# Patient Record
Sex: Female | Born: 1945 | Race: White | Hispanic: No | Marital: Single | Smoking: Never smoker
Health system: Southern US, Community
[De-identification: ages and names within clinical notes are randomized; demographics above are authoritative.]

## PROBLEM LIST (undated history)

## (undated) DIAGNOSIS — R0600 Dyspnea, unspecified: Secondary | ICD-10-CM

## (undated) DIAGNOSIS — R300 Dysuria: Secondary | ICD-10-CM

## (undated) DIAGNOSIS — I25119 Atherosclerotic heart disease of native coronary artery with unspecified angina pectoris: Secondary | ICD-10-CM

## (undated) DIAGNOSIS — Z5189 Encounter for other specified aftercare: Secondary | ICD-10-CM

## (undated) DIAGNOSIS — I2510 Atherosclerotic heart disease of native coronary artery without angina pectoris: Secondary | ICD-10-CM

## (undated) DIAGNOSIS — I350 Nonrheumatic aortic (valve) stenosis: Secondary | ICD-10-CM

## (undated) DIAGNOSIS — E11621 Type 2 diabetes mellitus with foot ulcer: Secondary | ICD-10-CM

## (undated) DIAGNOSIS — R6251 Failure to thrive (child): Secondary | ICD-10-CM

## (undated) DIAGNOSIS — S46819A Strain of other muscles, fascia and tendons at shoulder and upper arm level, unspecified arm, initial encounter: Secondary | ICD-10-CM

## (undated) DIAGNOSIS — K5289 Other specified noninfective gastroenteritis and colitis: Secondary | ICD-10-CM

## (undated) DIAGNOSIS — R5383 Other fatigue: Secondary | ICD-10-CM

## (undated) DIAGNOSIS — N390 Urinary tract infection, site not specified: Secondary | ICD-10-CM

## (undated) DIAGNOSIS — J309 Allergic rhinitis, unspecified: Principal | ICD-10-CM

## (undated) DIAGNOSIS — IMO0001 Reserved for inherently not codable concepts without codable children: Secondary | ICD-10-CM

## (undated) DIAGNOSIS — R011 Cardiac murmur, unspecified: Secondary | ICD-10-CM

## (undated) DIAGNOSIS — K5730 Diverticulosis of large intestine without perforation or abscess without bleeding: Secondary | ICD-10-CM

## (undated) DIAGNOSIS — F329 Major depressive disorder, single episode, unspecified: Secondary | ICD-10-CM

## (undated) DIAGNOSIS — S329XXA Fracture of unspecified parts of lumbosacral spine and pelvis, initial encounter for closed fracture: Secondary | ICD-10-CM

## (undated) DIAGNOSIS — I4891 Unspecified atrial fibrillation: Secondary | ICD-10-CM

## (undated) DIAGNOSIS — E785 Hyperlipidemia, unspecified: Secondary | ICD-10-CM

## (undated) DIAGNOSIS — I499 Cardiac arrhythmia, unspecified: Secondary | ICD-10-CM

## (undated) DIAGNOSIS — E119 Type 2 diabetes mellitus without complications: Secondary | ICD-10-CM

## (undated) DIAGNOSIS — R946 Abnormal results of thyroid function studies: Secondary | ICD-10-CM

## (undated) DIAGNOSIS — K219 Gastro-esophageal reflux disease without esophagitis: Secondary | ICD-10-CM

## (undated) DIAGNOSIS — R5381 Other malaise: Secondary | ICD-10-CM

## (undated) DIAGNOSIS — R109 Unspecified abdominal pain: Secondary | ICD-10-CM

## (undated) DIAGNOSIS — L989 Disorder of the skin and subcutaneous tissue, unspecified: Secondary | ICD-10-CM

## (undated) DIAGNOSIS — M25569 Pain in unspecified knee: Secondary | ICD-10-CM

## (undated) DIAGNOSIS — M1990 Unspecified osteoarthritis, unspecified site: Secondary | ICD-10-CM

## (undated) DIAGNOSIS — J019 Acute sinusitis, unspecified: Secondary | ICD-10-CM

## (undated) DIAGNOSIS — A4902 Methicillin resistant Staphylococcus aureus infection, unspecified site: Secondary | ICD-10-CM

## (undated) DIAGNOSIS — M810 Age-related osteoporosis without current pathological fracture: Secondary | ICD-10-CM

## (undated) DIAGNOSIS — J069 Acute upper respiratory infection, unspecified: Secondary | ICD-10-CM

## (undated) DIAGNOSIS — I10 Essential (primary) hypertension: Secondary | ICD-10-CM

## (undated) DIAGNOSIS — L97509 Non-pressure chronic ulcer of other part of unspecified foot with unspecified severity: Secondary | ICD-10-CM

## (undated) DIAGNOSIS — F411 Generalized anxiety disorder: Secondary | ICD-10-CM

## (undated) DIAGNOSIS — S43499A Other sprain of unspecified shoulder joint, initial encounter: Secondary | ICD-10-CM

## (undated) DIAGNOSIS — H9190 Unspecified hearing loss, unspecified ear: Secondary | ICD-10-CM

## (undated) DIAGNOSIS — H918X9 Other specified hearing loss, unspecified ear: Secondary | ICD-10-CM

## (undated) HISTORY — DX: Other sprain of unspecified shoulder joint, initial encounter: S43.499A

## (undated) HISTORY — DX: Other fatigue: R53.83

## (undated) HISTORY — DX: Non-pressure chronic ulcer of other part of unspecified foot with unspecified severity: L97.509

## (undated) HISTORY — DX: Fracture of unspecified parts of lumbosacral spine and pelvis, initial encounter for closed fracture: S32.9XXA

## (undated) HISTORY — DX: Failure to thrive (child): R62.51

## (undated) HISTORY — DX: Acute sinusitis, unspecified: J01.90

## (undated) HISTORY — DX: Reserved for inherently not codable concepts without codable children: IMO0001

## (undated) HISTORY — DX: Major depressive disorder, single episode, unspecified: F32.9

## (undated) HISTORY — DX: Other malaise: R53.81

## (undated) HISTORY — DX: Hyperlipidemia, unspecified: E78.5

## (undated) HISTORY — DX: Essential (primary) hypertension: I10

## (undated) HISTORY — DX: Abnormal results of thyroid function studies: R94.6

## (undated) HISTORY — DX: Other specified hearing loss, unspecified ear: H91.8X9

## (undated) HISTORY — DX: Unspecified osteoarthritis, unspecified site: M19.90

## (undated) HISTORY — DX: Methicillin resistant Staphylococcus aureus infection, unspecified site: A49.02

## (undated) HISTORY — DX: Generalized anxiety disorder: F41.1

## (undated) HISTORY — DX: Cardiac murmur, unspecified: R01.1

## (undated) HISTORY — PX: BREAST BIOPSY: SHX20

## (undated) HISTORY — DX: Type 2 diabetes mellitus without complications: E11.9

## (undated) HISTORY — PX: OTHER SURGICAL HISTORY: SHX169

## (undated) HISTORY — PX: CARDIAC CATHETERIZATION: SHX172

## (undated) HISTORY — DX: Unspecified abdominal pain: R10.9

## (undated) HISTORY — DX: Urinary tract infection, site not specified: N39.0

## (undated) HISTORY — DX: Gastro-esophageal reflux disease without esophagitis: K21.9

## (undated) HISTORY — DX: Unspecified atrial fibrillation: I48.91

## (undated) HISTORY — DX: Diverticulosis of large intestine without perforation or abscess without bleeding: K57.30

## (undated) HISTORY — DX: Age-related osteoporosis without current pathological fracture: M81.0

## (undated) HISTORY — DX: Acute upper respiratory infection, unspecified: J06.9

## (undated) HISTORY — DX: Atherosclerotic heart disease of native coronary artery without angina pectoris: I25.10

## (undated) HISTORY — DX: Disorder of the skin and subcutaneous tissue, unspecified: L98.9

## (undated) HISTORY — DX: Allergic rhinitis, unspecified: J30.9

## (undated) HISTORY — DX: Unspecified hearing loss, unspecified ear: H91.90

## (undated) HISTORY — DX: Other specified noninfective gastroenteritis and colitis: K52.89

## (undated) HISTORY — DX: Dysuria: R30.0

## (undated) HISTORY — DX: Encounter for other specified aftercare: Z51.89

## (undated) HISTORY — DX: Pain in unspecified knee: M25.569

## (undated) HISTORY — DX: Nonrheumatic aortic (valve) stenosis: I35.0

## (undated) HISTORY — PX: COLONOSCOPY: SHX5424

## (undated) HISTORY — PX: TONSILLECTOMY: SUR1361

## (undated) HISTORY — DX: Strain of other muscles, fascia and tendons at shoulder and upper arm level, unspecified arm, initial encounter: S46.819A

## (undated) HISTORY — DX: Type 2 diabetes mellitus with foot ulcer: E11.621

---
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
## 1997-07-09 ENCOUNTER — Other Ambulatory Visit: Admission: RE | Admit: 1997-07-09 | Discharge: 1997-07-09 | Payer: Self-pay | Admitting: Family Medicine

## 1998-06-15 ENCOUNTER — Ambulatory Visit (HOSPITAL_COMMUNITY): Admission: RE | Admit: 1998-06-15 | Discharge: 1998-06-15 | Payer: Self-pay | Admitting: Family Medicine

## 1998-08-19 ENCOUNTER — Other Ambulatory Visit: Admission: RE | Admit: 1998-08-19 | Discharge: 1998-08-19 | Payer: Self-pay | Admitting: Family Medicine

## 1999-03-18 ENCOUNTER — Ambulatory Visit (HOSPITAL_COMMUNITY): Admission: RE | Admit: 1999-03-18 | Discharge: 1999-03-18 | Payer: Self-pay | Admitting: Family Medicine

## 1999-03-18 ENCOUNTER — Encounter: Payer: Self-pay | Admitting: Family Medicine

## 1999-08-08 ENCOUNTER — Encounter (INDEPENDENT_AMBULATORY_CARE_PROVIDER_SITE_OTHER): Payer: Self-pay

## 1999-08-08 ENCOUNTER — Other Ambulatory Visit: Admission: RE | Admit: 1999-08-08 | Discharge: 1999-08-08 | Payer: Self-pay | Admitting: Obstetrics and Gynecology

## 2000-08-14 ENCOUNTER — Other Ambulatory Visit: Admission: RE | Admit: 2000-08-14 | Discharge: 2000-08-14 | Payer: Self-pay | Admitting: Obstetrics and Gynecology

## 2000-08-14 ENCOUNTER — Encounter: Admission: RE | Admit: 2000-08-14 | Discharge: 2000-08-14 | Payer: Self-pay | Admitting: Obstetrics and Gynecology

## 2000-08-14 ENCOUNTER — Encounter: Payer: Self-pay | Admitting: Obstetrics and Gynecology

## 2001-08-15 ENCOUNTER — Ambulatory Visit (HOSPITAL_COMMUNITY): Admission: RE | Admit: 2001-08-15 | Discharge: 2001-08-15 | Payer: Self-pay | Admitting: Family Medicine

## 2002-01-30 ENCOUNTER — Emergency Department (HOSPITAL_COMMUNITY): Admission: EM | Admit: 2002-01-30 | Discharge: 2002-01-30 | Payer: Self-pay | Admitting: Emergency Medicine

## 2002-01-30 ENCOUNTER — Encounter: Payer: Self-pay | Admitting: Emergency Medicine

## 2002-07-23 ENCOUNTER — Other Ambulatory Visit: Admission: RE | Admit: 2002-07-23 | Discharge: 2002-07-23 | Payer: Self-pay | Admitting: Family Medicine

## 2002-08-19 ENCOUNTER — Encounter: Payer: Self-pay | Admitting: Obstetrics and Gynecology

## 2002-08-19 ENCOUNTER — Ambulatory Visit (HOSPITAL_COMMUNITY): Admission: RE | Admit: 2002-08-19 | Discharge: 2002-08-19 | Payer: Self-pay | Admitting: Obstetrics and Gynecology

## 2003-09-11 ENCOUNTER — Encounter: Admission: RE | Admit: 2003-09-11 | Discharge: 2003-09-11 | Payer: Self-pay | Admitting: Internal Medicine

## 2004-02-22 ENCOUNTER — Other Ambulatory Visit: Admission: RE | Admit: 2004-02-22 | Discharge: 2004-02-22 | Payer: Self-pay | Admitting: Family Medicine

## 2004-05-09 ENCOUNTER — Ambulatory Visit (HOSPITAL_COMMUNITY): Admission: RE | Admit: 2004-05-09 | Discharge: 2004-05-09 | Payer: Self-pay | Admitting: Gastroenterology

## 2004-06-02 ENCOUNTER — Encounter: Admission: RE | Admit: 2004-06-02 | Discharge: 2004-06-02 | Payer: Self-pay | Admitting: Family Medicine

## 2005-01-27 ENCOUNTER — Encounter: Payer: Self-pay | Admitting: Internal Medicine

## 2005-01-27 LAB — CONVERTED CEMR LAB

## 2005-05-30 ENCOUNTER — Ambulatory Visit: Payer: Self-pay | Admitting: Internal Medicine

## 2005-06-08 ENCOUNTER — Ambulatory Visit: Payer: Self-pay | Admitting: Internal Medicine

## 2005-07-06 ENCOUNTER — Ambulatory Visit: Payer: Self-pay | Admitting: Internal Medicine

## 2005-07-20 ENCOUNTER — Encounter: Admission: RE | Admit: 2005-07-20 | Discharge: 2005-07-20 | Payer: Self-pay | Admitting: Internal Medicine

## 2005-08-18 ENCOUNTER — Ambulatory Visit: Payer: Self-pay | Admitting: Internal Medicine

## 2005-08-18 ENCOUNTER — Ambulatory Visit (HOSPITAL_COMMUNITY): Admission: RE | Admit: 2005-08-18 | Discharge: 2005-08-18 | Payer: Self-pay | Admitting: Internal Medicine

## 2005-10-16 ENCOUNTER — Ambulatory Visit: Payer: Self-pay | Admitting: Internal Medicine

## 2005-11-30 ENCOUNTER — Ambulatory Visit: Payer: Self-pay | Admitting: Internal Medicine

## 2006-01-24 ENCOUNTER — Ambulatory Visit: Payer: Self-pay | Admitting: Internal Medicine

## 2006-01-24 LAB — CONVERTED CEMR LAB
AST: 23 units/L (ref 0–37)
Albumin: 4 g/dL (ref 3.5–5.2)
Alkaline Phosphatase: 64 units/L (ref 39–117)
BUN: 9 mg/dL (ref 6–23)
Chol/HDL Ratio, serum: 3.2
Cholesterol: 161 mg/dL (ref 0–200)
Creatinine, Ser: 0.7 mg/dL (ref 0.4–1.2)
Eosinophil percent: 2.1 % (ref 0.0–5.0)
HCT: 37.7 % (ref 36.0–46.0)
HDL: 49.9 mg/dL (ref >39.0)
Hemoglobin, Urine: NEGATIVE
Hemoglobin: 12.7 g/dL (ref 12.0–15.0)
Neutrophils Relative %: 72.6 % (ref 43.0–77.0)
Nitrite: NEGATIVE
Potassium: 4.4 meq/L (ref 3.5–5.1)
RDW: 12.2 % (ref 11.5–14.6)
TSH: 0.61 microintl units/mL
Total Bilirubin: 0.8 mg/dL (ref 0.3–1.2)
Total Protein: 6.6 g/dL (ref 6.0–8.3)
Urine Glucose: NEGATIVE mg/dL

## 2006-02-12 ENCOUNTER — Ambulatory Visit: Payer: Self-pay | Admitting: Internal Medicine

## 2006-03-07 ENCOUNTER — Ambulatory Visit: Payer: Self-pay | Admitting: Gastroenterology

## 2006-03-21 ENCOUNTER — Ambulatory Visit: Payer: Self-pay | Admitting: Gastroenterology

## 2006-03-22 LAB — HM COLONOSCOPY

## 2006-03-27 ENCOUNTER — Ambulatory Visit: Payer: Self-pay | Admitting: Internal Medicine

## 2006-07-05 ENCOUNTER — Ambulatory Visit: Payer: Self-pay | Admitting: Internal Medicine

## 2006-07-05 LAB — CONVERTED CEMR LAB: Hgb A1c MFr Bld: 5.7 % (ref 4.6–6.0)

## 2006-09-04 ENCOUNTER — Encounter: Admission: RE | Admit: 2006-09-04 | Discharge: 2006-09-04 | Payer: Self-pay | Admitting: Internal Medicine

## 2006-09-05 ENCOUNTER — Encounter: Payer: Self-pay | Admitting: Internal Medicine

## 2006-09-05 DIAGNOSIS — E785 Hyperlipidemia, unspecified: Secondary | ICD-10-CM | POA: Insufficient documentation

## 2006-09-05 DIAGNOSIS — I10 Essential (primary) hypertension: Secondary | ICD-10-CM

## 2006-09-05 DIAGNOSIS — K219 Gastro-esophageal reflux disease without esophagitis: Secondary | ICD-10-CM

## 2006-09-05 DIAGNOSIS — J309 Allergic rhinitis, unspecified: Secondary | ICD-10-CM | POA: Insufficient documentation

## 2006-09-05 DIAGNOSIS — M1990 Unspecified osteoarthritis, unspecified site: Secondary | ICD-10-CM

## 2006-09-05 DIAGNOSIS — K5730 Diverticulosis of large intestine without perforation or abscess without bleeding: Secondary | ICD-10-CM | POA: Insufficient documentation

## 2006-09-05 DIAGNOSIS — F329 Major depressive disorder, single episode, unspecified: Secondary | ICD-10-CM

## 2006-09-05 DIAGNOSIS — F3289 Other specified depressive episodes: Secondary | ICD-10-CM | POA: Insufficient documentation

## 2006-09-05 HISTORY — DX: Other specified depressive episodes: F32.89

## 2006-09-05 HISTORY — DX: Essential (primary) hypertension: I10

## 2006-09-05 HISTORY — DX: Gastro-esophageal reflux disease without esophagitis: K21.9

## 2006-09-05 HISTORY — DX: Diverticulosis of large intestine without perforation or abscess without bleeding: K57.30

## 2006-09-05 HISTORY — DX: Unspecified osteoarthritis, unspecified site: M19.90

## 2006-09-05 HISTORY — DX: Major depressive disorder, single episode, unspecified: F32.9

## 2006-09-05 HISTORY — DX: Hyperlipidemia, unspecified: E78.5

## 2006-09-21 ENCOUNTER — Ambulatory Visit: Payer: Self-pay | Admitting: Internal Medicine

## 2006-11-08 ENCOUNTER — Encounter: Admission: RE | Admit: 2006-11-08 | Discharge: 2006-12-07 | Payer: Self-pay | Admitting: Family Medicine

## 2006-11-28 ENCOUNTER — Ambulatory Visit: Payer: Self-pay | Admitting: Internal Medicine

## 2006-12-07 ENCOUNTER — Encounter (INDEPENDENT_AMBULATORY_CARE_PROVIDER_SITE_OTHER): Payer: Self-pay | Admitting: *Deleted

## 2006-12-31 HISTORY — PX: ABDOMINAL HYSTERECTOMY: SHX81

## 2007-01-04 ENCOUNTER — Ambulatory Visit (HOSPITAL_COMMUNITY): Admission: RE | Admit: 2007-01-04 | Discharge: 2007-01-05 | Payer: Self-pay | Admitting: Obstetrics and Gynecology

## 2007-01-04 ENCOUNTER — Encounter (INDEPENDENT_AMBULATORY_CARE_PROVIDER_SITE_OTHER): Payer: Self-pay | Admitting: Obstetrics and Gynecology

## 2007-01-05 ENCOUNTER — Encounter: Payer: Self-pay | Admitting: Internal Medicine

## 2007-02-01 ENCOUNTER — Encounter: Payer: Self-pay | Admitting: Internal Medicine

## 2007-02-20 ENCOUNTER — Ambulatory Visit: Payer: Self-pay | Admitting: Internal Medicine

## 2007-02-20 DIAGNOSIS — J069 Acute upper respiratory infection, unspecified: Secondary | ICD-10-CM | POA: Insufficient documentation

## 2007-02-20 DIAGNOSIS — N390 Urinary tract infection, site not specified: Secondary | ICD-10-CM

## 2007-02-20 LAB — CONVERTED CEMR LAB: Blood Glucose, Fingerstick: 116

## 2007-03-08 ENCOUNTER — Encounter: Payer: Self-pay | Admitting: Internal Medicine

## 2007-03-13 ENCOUNTER — Encounter: Payer: Self-pay | Admitting: Internal Medicine

## 2007-03-19 ENCOUNTER — Ambulatory Visit: Payer: Self-pay | Admitting: Internal Medicine

## 2007-05-07 ENCOUNTER — Encounter: Payer: Self-pay | Admitting: Internal Medicine

## 2007-05-21 ENCOUNTER — Ambulatory Visit: Payer: Self-pay | Admitting: Internal Medicine

## 2007-06-03 ENCOUNTER — Encounter: Payer: Self-pay | Admitting: Internal Medicine

## 2007-06-05 ENCOUNTER — Ambulatory Visit: Payer: Self-pay | Admitting: Internal Medicine

## 2007-06-05 LAB — CONVERTED CEMR LAB
Alkaline Phosphatase: 66 units/L (ref 39–117)
Bilirubin, Direct: 0.1 mg/dL (ref 0.0–0.3)
CO2: 28 meq/L (ref 19–32)
GFR calc Af Amer: 109 mL/min
Glucose, Bld: 114 mg/dL — ABNORMAL HIGH (ref 70–99)
Potassium: 4.4 meq/L (ref 3.5–5.1)
Sodium: 142 meq/L (ref 135–145)
Total Protein: 6.6 g/dL (ref 6.0–8.3)
VLDL: 17 mg/dL (ref 0–40)

## 2007-06-06 ENCOUNTER — Encounter: Payer: Self-pay | Admitting: Internal Medicine

## 2007-07-31 ENCOUNTER — Ambulatory Visit: Payer: Self-pay | Admitting: Internal Medicine

## 2007-07-31 DIAGNOSIS — F411 Generalized anxiety disorder: Secondary | ICD-10-CM

## 2007-07-31 DIAGNOSIS — E119 Type 2 diabetes mellitus without complications: Secondary | ICD-10-CM | POA: Insufficient documentation

## 2007-07-31 DIAGNOSIS — M810 Age-related osteoporosis without current pathological fracture: Secondary | ICD-10-CM

## 2007-07-31 HISTORY — DX: Type 2 diabetes mellitus without complications: E11.9

## 2007-07-31 HISTORY — DX: Generalized anxiety disorder: F41.1

## 2007-07-31 HISTORY — DX: Age-related osteoporosis without current pathological fracture: M81.0

## 2007-07-31 LAB — CONVERTED CEMR LAB
ALT: 36 units/L — ABNORMAL HIGH (ref 0–35)
AST: 29 units/L (ref 0–37)
Basophils Absolute: 0 10*3/uL (ref 0.0–0.1)
Bilirubin Urine: NEGATIVE
Bilirubin, Direct: 0.1 mg/dL (ref 0.0–0.3)
CO2: 26 meq/L (ref 19–32)
Chloride: 105 meq/L (ref 96–112)
Cholesterol: 161 mg/dL (ref 0–200)
Eosinophils Absolute: 0.1 10*3/uL (ref 0.0–0.7)
GFR calc non Af Amer: 90 mL/min
HDL: 40.6 mg/dL (ref >39.0)
Hemoglobin, Urine: NEGATIVE
Ketones, ur: NEGATIVE mg/dL
Leukocytes, UA: NEGATIVE
Lymphocytes Relative: 29.8 % (ref 12.0–46.0)
MCHC: 33.9 g/dL (ref 30.0–36.0)
Neutrophils Relative %: 56.8 % (ref 43.0–77.0)
Platelets: 295 10*3/uL (ref 150–400)
Potassium: 4.1 meq/L (ref 3.5–5.1)
RBC: 4.04 M/uL (ref 3.87–5.11)
RDW: 12.1 % (ref 11.5–14.6)
Sodium: 140 meq/L (ref 135–145)
Specific Gravity, Urine: 1.025 (ref 1.000–1.03)
Total Bilirubin: 0.5 mg/dL (ref 0.3–1.2)
Total CHOL/HDL Ratio: 4
Urobilinogen, UA: 0.2 (ref 0.0–1.0)
VLDL: 43 mg/dL — ABNORMAL HIGH (ref 0–40)

## 2007-08-01 LAB — CONVERTED CEMR LAB: Vit D, 1,25-Dihydroxy: 38 (ref 30–89)

## 2007-08-07 ENCOUNTER — Telehealth: Payer: Self-pay | Admitting: Internal Medicine

## 2007-08-09 ENCOUNTER — Encounter: Payer: Self-pay | Admitting: Internal Medicine

## 2007-08-14 ENCOUNTER — Encounter: Payer: Self-pay | Admitting: Internal Medicine

## 2007-09-10 ENCOUNTER — Telehealth (INDEPENDENT_AMBULATORY_CARE_PROVIDER_SITE_OTHER): Payer: Self-pay | Admitting: *Deleted

## 2007-09-17 ENCOUNTER — Encounter: Admission: RE | Admit: 2007-09-17 | Discharge: 2007-09-17 | Payer: Self-pay | Admitting: Internal Medicine

## 2007-09-18 ENCOUNTER — Telehealth (INDEPENDENT_AMBULATORY_CARE_PROVIDER_SITE_OTHER): Payer: Self-pay | Admitting: *Deleted

## 2007-09-19 ENCOUNTER — Encounter: Payer: Self-pay | Admitting: Internal Medicine

## 2007-10-08 ENCOUNTER — Encounter: Admission: RE | Admit: 2007-10-08 | Discharge: 2007-10-08 | Payer: Self-pay | Admitting: Internal Medicine

## 2007-10-09 ENCOUNTER — Telehealth: Payer: Self-pay | Admitting: Internal Medicine

## 2007-10-09 ENCOUNTER — Ambulatory Visit: Payer: Self-pay | Admitting: Internal Medicine

## 2007-10-09 DIAGNOSIS — H9190 Unspecified hearing loss, unspecified ear: Secondary | ICD-10-CM

## 2007-10-09 DIAGNOSIS — J019 Acute sinusitis, unspecified: Secondary | ICD-10-CM

## 2007-10-09 HISTORY — DX: Unspecified hearing loss, unspecified ear: H91.90

## 2007-10-10 ENCOUNTER — Telehealth (INDEPENDENT_AMBULATORY_CARE_PROVIDER_SITE_OTHER): Payer: Self-pay | Admitting: *Deleted

## 2007-10-24 ENCOUNTER — Ambulatory Visit: Payer: Self-pay | Admitting: Internal Medicine

## 2007-10-29 ENCOUNTER — Telehealth (INDEPENDENT_AMBULATORY_CARE_PROVIDER_SITE_OTHER): Payer: Self-pay | Admitting: *Deleted

## 2007-11-05 ENCOUNTER — Telehealth: Payer: Self-pay | Admitting: Internal Medicine

## 2007-11-06 ENCOUNTER — Telehealth: Payer: Self-pay | Admitting: Internal Medicine

## 2007-11-12 ENCOUNTER — Telehealth: Payer: Self-pay | Admitting: Internal Medicine

## 2007-12-06 ENCOUNTER — Telehealth: Payer: Self-pay | Admitting: Internal Medicine

## 2008-01-27 ENCOUNTER — Ambulatory Visit: Payer: Self-pay | Admitting: Internal Medicine

## 2008-01-27 LAB — CONVERTED CEMR LAB
BUN: 18 mg/dL (ref 6–23)
Chloride: 103 meq/L (ref 96–112)
Cholesterol: 174 mg/dL (ref 0–200)
Creatinine, Ser: 0.5 mg/dL (ref 0.4–1.2)
GFR calc non Af Amer: 133 mL/min
Glucose, Bld: 100 mg/dL — ABNORMAL HIGH (ref 70–99)
LDL Cholesterol: 102 mg/dL — ABNORMAL HIGH (ref 0–99)
Potassium: 4.2 meq/L (ref 3.5–5.1)
Triglycerides: 106 mg/dL (ref 0–149)
VLDL: 21 mg/dL (ref 0–40)

## 2008-02-05 ENCOUNTER — Telehealth: Payer: Self-pay | Admitting: Internal Medicine

## 2008-04-02 ENCOUNTER — Telehealth: Payer: Self-pay | Admitting: Internal Medicine

## 2008-06-02 ENCOUNTER — Telehealth: Payer: Self-pay | Admitting: Internal Medicine

## 2008-07-20 ENCOUNTER — Emergency Department (HOSPITAL_COMMUNITY): Admission: EM | Admit: 2008-07-20 | Discharge: 2008-07-20 | Payer: Self-pay | Admitting: Emergency Medicine

## 2008-07-20 ENCOUNTER — Ambulatory Visit: Payer: Self-pay | Admitting: Internal Medicine

## 2008-07-21 ENCOUNTER — Encounter: Payer: Self-pay | Admitting: Internal Medicine

## 2008-07-21 ENCOUNTER — Telehealth (INDEPENDENT_AMBULATORY_CARE_PROVIDER_SITE_OTHER): Payer: Self-pay | Admitting: *Deleted

## 2008-07-21 LAB — CONVERTED CEMR LAB
ALT: 32 units/L (ref 0–35)
AST: 33 units/L (ref 0–37)
Albumin: 4.3 g/dL (ref 3.5–5.2)
Alkaline Phosphatase: 69 units/L (ref 39–117)
BUN: 17 mg/dL (ref 6–23)
Basophils Absolute: 0 10*3/uL (ref 0.0–0.1)
Basophils Relative: 0.5 % (ref 0.0–3.0)
Chloride: 100 meq/L (ref 96–112)
Cholesterol: 226 mg/dL — ABNORMAL HIGH (ref 0–200)
Creatinine, Ser: 0.6 mg/dL (ref 0.4–1.2)
Creatinine,U: 49.7 mg/dL
Eosinophils Relative: 0.6 % (ref 0.0–5.0)
Glucose, Bld: 100 mg/dL — ABNORMAL HIGH (ref 70–99)
HCT: 37.2 % (ref 36.0–46.0)
Hemoglobin, Urine: NEGATIVE
Hemoglobin: 13.1 g/dL (ref 12.0–15.0)
Lymphocytes Relative: 21.5 % (ref 12.0–46.0)
Lymphs Abs: 1.6 10*3/uL (ref 0.7–4.0)
Monocytes Relative: 7.7 % (ref 3.0–12.0)
Neutro Abs: 5.4 10*3/uL (ref 1.4–7.7)
Nitrite: NEGATIVE
Potassium: 4.1 meq/L (ref 3.5–5.1)
RBC: 4.09 M/uL (ref 3.87–5.11)
Specific Gravity, Urine: <=1.005 (ref 1.000–1.030)
TSH: 0.41 microintl units/mL (ref 0.35–5.50)
Total CHOL/HDL Ratio: 5
Total Protein, Urine: NEGATIVE mg/dL
Total Protein: 7.1 g/dL (ref 6.0–8.3)
Urine Glucose: NEGATIVE mg/dL
VLDL: 28 mg/dL (ref 0.0–40.0)
WBC: 7.6 10*3/uL (ref 4.5–10.5)

## 2008-07-27 ENCOUNTER — Ambulatory Visit: Payer: Self-pay | Admitting: Internal Medicine

## 2008-07-27 ENCOUNTER — Telehealth (INDEPENDENT_AMBULATORY_CARE_PROVIDER_SITE_OTHER): Payer: Self-pay | Admitting: *Deleted

## 2008-08-04 ENCOUNTER — Encounter: Payer: Self-pay | Admitting: Internal Medicine

## 2008-08-11 ENCOUNTER — Encounter: Payer: Self-pay | Admitting: Internal Medicine

## 2008-08-14 ENCOUNTER — Telehealth: Payer: Self-pay | Admitting: Internal Medicine

## 2008-08-20 ENCOUNTER — Encounter: Admission: RE | Admit: 2008-08-20 | Discharge: 2008-10-29 | Payer: Self-pay | Admitting: Family Medicine

## 2008-08-24 ENCOUNTER — Ambulatory Visit: Payer: Self-pay | Admitting: Internal Medicine

## 2008-08-24 LAB — CONVERTED CEMR LAB
Albumin: 3.9 g/dL (ref 3.5–5.2)
Bilirubin, Direct: 0.2 mg/dL (ref 0.0–0.3)
HDL: 41.5 mg/dL (ref >39.00)
Total Protein: 6.9 g/dL (ref 6.0–8.3)
Triglycerides: 82 mg/dL (ref 0.0–149.0)
VLDL: 16.4 mg/dL (ref 0.0–40.0)

## 2008-10-08 ENCOUNTER — Telehealth: Payer: Self-pay | Admitting: Internal Medicine

## 2008-10-27 ENCOUNTER — Ambulatory Visit: Payer: Self-pay | Admitting: Internal Medicine

## 2008-11-03 ENCOUNTER — Telehealth: Payer: Self-pay | Admitting: Internal Medicine

## 2008-11-05 ENCOUNTER — Encounter: Admission: RE | Admit: 2008-11-05 | Discharge: 2008-11-05 | Payer: Self-pay | Admitting: Internal Medicine

## 2008-11-06 ENCOUNTER — Encounter: Payer: Self-pay | Admitting: Internal Medicine

## 2008-11-11 ENCOUNTER — Telehealth: Payer: Self-pay | Admitting: Internal Medicine

## 2008-12-25 ENCOUNTER — Ambulatory Visit: Payer: Self-pay | Admitting: Family Medicine

## 2008-12-25 DIAGNOSIS — K5289 Other specified noninfective gastroenteritis and colitis: Secondary | ICD-10-CM

## 2008-12-30 ENCOUNTER — Encounter: Admission: RE | Admit: 2008-12-30 | Discharge: 2009-01-27 | Payer: Self-pay | Admitting: Family Medicine

## 2009-01-04 ENCOUNTER — Ambulatory Visit: Payer: Self-pay | Admitting: Internal Medicine

## 2009-01-04 LAB — CONVERTED CEMR LAB
CO2: 31 meq/L (ref 19–32)
Calcium: 8.9 mg/dL (ref 8.4–10.5)
Creatinine, Ser: 0.6 mg/dL (ref 0.4–1.2)
Direct LDL: 158.3 mg/dL
GFR calc non Af Amer: 107.2 mL/min (ref >60)
Glucose, Bld: 104 mg/dL — ABNORMAL HIGH (ref 70–99)
Hgb A1c MFr Bld: 5.8 % (ref 4.6–6.5)

## 2009-01-07 ENCOUNTER — Telehealth: Payer: Self-pay | Admitting: Internal Medicine

## 2009-01-11 ENCOUNTER — Ambulatory Visit: Payer: Self-pay | Admitting: Internal Medicine

## 2009-02-02 ENCOUNTER — Telehealth: Payer: Self-pay | Admitting: Internal Medicine

## 2009-02-03 ENCOUNTER — Telehealth: Payer: Self-pay | Admitting: Internal Medicine

## 2009-02-07 ENCOUNTER — Emergency Department (HOSPITAL_COMMUNITY): Admission: EM | Admit: 2009-02-07 | Discharge: 2009-02-07 | Payer: Self-pay | Admitting: Family Medicine

## 2009-02-17 ENCOUNTER — Encounter: Payer: Self-pay | Admitting: Internal Medicine

## 2009-02-18 ENCOUNTER — Telehealth: Payer: Self-pay | Admitting: Internal Medicine

## 2009-02-18 ENCOUNTER — Encounter (INDEPENDENT_AMBULATORY_CARE_PROVIDER_SITE_OTHER): Payer: Self-pay | Admitting: *Deleted

## 2009-04-07 ENCOUNTER — Telehealth: Payer: Self-pay | Admitting: Internal Medicine

## 2009-05-04 ENCOUNTER — Telehealth: Payer: Self-pay | Admitting: Internal Medicine

## 2009-05-10 ENCOUNTER — Ambulatory Visit: Payer: Self-pay | Admitting: Internal Medicine

## 2009-05-10 DIAGNOSIS — M797 Fibromyalgia: Secondary | ICD-10-CM

## 2009-05-10 DIAGNOSIS — L989 Disorder of the skin and subcutaneous tissue, unspecified: Secondary | ICD-10-CM | POA: Insufficient documentation

## 2009-05-10 DIAGNOSIS — IMO0001 Reserved for inherently not codable concepts without codable children: Secondary | ICD-10-CM

## 2009-05-10 HISTORY — DX: Reserved for inherently not codable concepts without codable children: IMO0001

## 2009-05-20 ENCOUNTER — Telehealth: Payer: Self-pay | Admitting: Internal Medicine

## 2009-06-19 ENCOUNTER — Emergency Department (HOSPITAL_COMMUNITY): Admission: EM | Admit: 2009-06-19 | Discharge: 2009-06-19 | Payer: Self-pay | Admitting: Family Medicine

## 2009-06-20 ENCOUNTER — Inpatient Hospital Stay (HOSPITAL_COMMUNITY): Admission: EM | Admit: 2009-06-20 | Discharge: 2009-06-24 | Payer: Self-pay | Admitting: Emergency Medicine

## 2009-06-23 ENCOUNTER — Telehealth: Payer: Self-pay | Admitting: Internal Medicine

## 2009-07-05 ENCOUNTER — Ambulatory Visit: Payer: Self-pay | Admitting: Internal Medicine

## 2009-07-05 LAB — CONVERTED CEMR LAB
ALT: 22 units/L (ref 0–35)
Basophils Relative: 0.5 % (ref 0.0–3.0)
Bilirubin, Direct: 0.1 mg/dL (ref 0.0–0.3)
Chloride: 102 meq/L (ref 96–112)
Cholesterol: 165 mg/dL (ref 0–200)
Creatinine,U: 38 mg/dL
Eosinophils Relative: 1.8 % (ref 0.0–5.0)
Folate: 19.5 ng/mL
HCT: 33.3 % — ABNORMAL LOW (ref 36.0–46.0)
Hemoglobin, Urine: NEGATIVE
Hemoglobin: 11.4 g/dL — ABNORMAL LOW (ref 12.0–15.0)
Ketones, ur: NEGATIVE mg/dL
LDL Cholesterol: 100 mg/dL — ABNORMAL HIGH (ref 0–99)
Lymphs Abs: 1.3 10*3/uL (ref 0.7–4.0)
MCV: 93.8 fL (ref 78.0–100.0)
Microalb, Ur: 1.1 mg/dL (ref 0.0–1.9)
Monocytes Absolute: 0.5 10*3/uL (ref 0.1–1.0)
Neutro Abs: 4.5 10*3/uL (ref 1.4–7.7)
Neutrophils Relative %: 69.5 % (ref 43.0–77.0)
Potassium: 4.6 meq/L (ref 3.5–5.1)
RBC: 3.55 M/uL — ABNORMAL LOW (ref 3.87–5.11)
Saturation Ratios: 18 % — ABNORMAL LOW (ref 20.0–50.0)
Sodium: 139 meq/L (ref 135–145)
Total Protein: 6.4 g/dL (ref 6.0–8.3)
Transferrin: 238.2 mg/dL (ref 212.0–360.0)
Urine Glucose: NEGATIVE mg/dL
Urobilinogen, UA: 0.2 (ref 0.0–1.0)
Vitamin B-12: 542 pg/mL (ref 211–911)
WBC: 6.4 10*3/uL (ref 4.5–10.5)

## 2009-07-07 ENCOUNTER — Encounter: Payer: Self-pay | Admitting: Internal Medicine

## 2009-07-16 ENCOUNTER — Telehealth: Payer: Self-pay | Admitting: Internal Medicine

## 2009-07-29 ENCOUNTER — Telehealth: Payer: Self-pay | Admitting: Internal Medicine

## 2009-08-04 ENCOUNTER — Ambulatory Visit: Payer: Self-pay | Admitting: Internal Medicine

## 2009-08-04 DIAGNOSIS — R946 Abnormal results of thyroid function studies: Secondary | ICD-10-CM | POA: Insufficient documentation

## 2009-08-04 LAB — CONVERTED CEMR LAB: TSH: 0.32 microintl units/mL — ABNORMAL LOW (ref 0.35–5.50)

## 2009-08-19 ENCOUNTER — Encounter: Payer: Self-pay | Admitting: Internal Medicine

## 2009-08-20 ENCOUNTER — Telehealth: Payer: Self-pay | Admitting: Internal Medicine

## 2009-08-24 ENCOUNTER — Telehealth: Payer: Self-pay | Admitting: Internal Medicine

## 2009-09-03 ENCOUNTER — Telehealth: Payer: Self-pay | Admitting: Internal Medicine

## 2009-09-13 ENCOUNTER — Encounter: Payer: Self-pay | Admitting: Internal Medicine

## 2009-10-01 ENCOUNTER — Encounter: Payer: Self-pay | Admitting: Internal Medicine

## 2009-10-01 ENCOUNTER — Telehealth: Payer: Self-pay | Admitting: Internal Medicine

## 2009-11-01 ENCOUNTER — Ambulatory Visit: Payer: Self-pay | Admitting: Internal Medicine

## 2009-11-01 DIAGNOSIS — H918X9 Other specified hearing loss, unspecified ear: Secondary | ICD-10-CM

## 2009-11-01 DIAGNOSIS — S43499A Other sprain of unspecified shoulder joint, initial encounter: Secondary | ICD-10-CM

## 2009-11-01 DIAGNOSIS — S46819A Strain of other muscles, fascia and tendons at shoulder and upper arm level, unspecified arm, initial encounter: Secondary | ICD-10-CM

## 2009-11-08 ENCOUNTER — Encounter: Admission: RE | Admit: 2009-11-08 | Discharge: 2009-11-08 | Payer: Self-pay | Admitting: Internal Medicine

## 2009-12-14 ENCOUNTER — Telehealth: Payer: Self-pay | Admitting: Internal Medicine

## 2009-12-17 ENCOUNTER — Ambulatory Visit: Payer: Self-pay | Admitting: Internal Medicine

## 2010-01-13 ENCOUNTER — Ambulatory Visit: Payer: Self-pay | Admitting: Internal Medicine

## 2010-01-13 LAB — CONVERTED CEMR LAB
ALT: 19 units/L (ref 0–35)
AST: 23 units/L (ref 0–37)
Albumin: 3.8 g/dL (ref 3.5–5.2)
BUN: 12 mg/dL (ref 6–23)
Basophils Relative: 0.4 % (ref 0.0–3.0)
Cholesterol: 139 mg/dL (ref 0–200)
Creatinine, Ser: 0.5 mg/dL (ref 0.4–1.2)
Eosinophils Absolute: 0.1 10*3/uL (ref 0.0–0.7)
Eosinophils Relative: 2.4 % (ref 0.0–5.0)
GFR calc non Af Amer: 123.29 mL/min (ref >60.00)
Glucose, Bld: 99 mg/dL (ref 70–99)
HCT: 35.8 % — ABNORMAL LOW (ref 36.0–46.0)
HDL: 36.5 mg/dL — ABNORMAL LOW (ref >39.00)
Hemoglobin: 12.1 g/dL (ref 12.0–15.0)
MCHC: 33.9 g/dL (ref 30.0–36.0)
MCV: 93.3 fL (ref 78.0–100.0)
Monocytes Absolute: 0.5 10*3/uL (ref 0.1–1.0)
Neutro Abs: 3.2 10*3/uL (ref 1.4–7.7)
Neutrophils Relative %: 60.9 % (ref 43.0–77.0)
Nitrite: NEGATIVE
Potassium: 4.5 meq/L (ref 3.5–5.1)
RBC: 3.84 M/uL — ABNORMAL LOW (ref 3.87–5.11)
TSH: 0.67 microintl units/mL (ref 0.35–5.50)
Total Bilirubin: 0.7 mg/dL (ref 0.3–1.2)
Total Protein, Urine: NEGATIVE mg/dL
Urine Glucose: NEGATIVE mg/dL
WBC: 5.3 10*3/uL (ref 4.5–10.5)
pH: 6 (ref 5.0–8.0)

## 2010-01-20 ENCOUNTER — Encounter: Payer: Self-pay | Admitting: Internal Medicine

## 2010-02-04 ENCOUNTER — Ambulatory Visit
Admission: RE | Admit: 2010-02-04 | Discharge: 2010-02-04 | Payer: Self-pay | Source: Home / Self Care | Attending: Internal Medicine | Admitting: Internal Medicine

## 2010-02-04 ENCOUNTER — Encounter: Payer: Self-pay | Admitting: Internal Medicine

## 2010-02-04 ENCOUNTER — Other Ambulatory Visit: Payer: Self-pay | Admitting: Internal Medicine

## 2010-02-04 DIAGNOSIS — M25569 Pain in unspecified knee: Secondary | ICD-10-CM | POA: Insufficient documentation

## 2010-02-04 DIAGNOSIS — R109 Unspecified abdominal pain: Secondary | ICD-10-CM | POA: Insufficient documentation

## 2010-02-04 LAB — URINALYSIS, ROUTINE W REFLEX MICROSCOPIC
Bilirubin Urine: NEGATIVE
Hemoglobin, Urine: NEGATIVE
Ketones, ur: NEGATIVE
Leukocytes, UA: NEGATIVE
Nitrite: NEGATIVE
Specific Gravity, Urine: <=1.005 (ref 1.000–1.030)
Total Protein, Urine: NEGATIVE
Urine Glucose: NEGATIVE
Urobilinogen, UA: 0.2 (ref 0.0–1.0)
pH: 6 (ref 5.0–8.0)

## 2010-02-07 ENCOUNTER — Encounter
Admission: RE | Admit: 2010-02-07 | Discharge: 2010-02-07 | Payer: Self-pay | Source: Home / Self Care | Attending: Family Medicine | Admitting: Family Medicine

## 2010-02-08 ENCOUNTER — Encounter: Payer: Self-pay | Admitting: Internal Medicine

## 2010-02-19 ENCOUNTER — Inpatient Hospital Stay (HOSPITAL_COMMUNITY)
Admission: EM | Admit: 2010-02-19 | Discharge: 2010-02-24 | Payer: Self-pay | Source: Home / Self Care | Attending: Internal Medicine | Admitting: Internal Medicine

## 2010-02-21 ENCOUNTER — Encounter: Admission: RE | Admit: 2010-02-21 | Payer: Self-pay | Source: Home / Self Care | Admitting: Family Medicine

## 2010-02-22 LAB — CBC
HCT: 30.7 % — ABNORMAL LOW (ref 36.0–46.0)
Hemoglobin: 10.5 g/dL — ABNORMAL LOW (ref 12.0–15.0)
MCH: 29.5 pg (ref 26.0–34.0)
MCH: 29.9 pg (ref 26.0–34.0)
MCHC: 32.5 g/dL (ref 30.0–36.0)
MCHC: 33.6 g/dL (ref 30.0–36.0)
MCHC: 33.3 g/dL (ref 30.0–36.0)
MCV: 89.2 fL (ref 78.0–100.0)
Platelets: 375 10*3/uL (ref 150–400)
RBC: 3.7 MIL/uL — ABNORMAL LOW (ref 3.87–5.11)
RBC: 3.44 MIL/uL — ABNORMAL LOW (ref 3.87–5.11)
RBC: 3.45 MIL/uL — ABNORMAL LOW (ref 3.87–5.11)
WBC: 16.8 10*3/uL — ABNORMAL HIGH (ref 4.0–10.5)

## 2010-02-22 LAB — COMPREHENSIVE METABOLIC PANEL
AST: 32 U/L (ref 0–37)
Albumin: 3.5 g/dL (ref 3.5–5.2)
Alkaline Phosphatase: 152 U/L — ABNORMAL HIGH (ref 39–117)
CO2: 25 mEq/L (ref 19–32)
Calcium: 8.7 mg/dL (ref 8.4–10.5)
Creatinine, Ser: 0.59 mg/dL (ref 0.4–1.2)
GFR calc Af Amer: >60 mL/min (ref >60)
Sodium: 133 mEq/L — ABNORMAL LOW (ref 135–145)
Total Bilirubin: 0.7 mg/dL (ref 0.3–1.2)
Total Protein: 6.3 g/dL (ref 6.0–8.3)

## 2010-02-22 LAB — DIFFERENTIAL
Basophils Absolute: 0 10*3/uL (ref 0.0–0.1)
Basophils Absolute: 0 10*3/uL (ref 0.0–0.1)
Basophils Relative: 0 % (ref 0–1)
Eosinophils Absolute: 0 10*3/uL (ref 0.0–0.7)
Lymphocytes Relative: 13 % (ref 12–46)
Monocytes Absolute: 0.9 10*3/uL (ref 0.1–1.0)
Monocytes Relative: 6 % (ref 3–12)
Neutro Abs: 15 10*3/uL — ABNORMAL HIGH (ref 1.7–7.7)
Neutro Abs: 9.1 10*3/uL — ABNORMAL HIGH (ref 1.7–7.7)
Neutrophils Relative %: 72 % (ref 43–77)

## 2010-02-22 LAB — URINALYSIS, ROUTINE W REFLEX MICROSCOPIC
Bilirubin Urine: NEGATIVE
Hgb urine dipstick: NEGATIVE
Ketones, ur: NEGATIVE mg/dL
Nitrite: NEGATIVE
Urine Glucose, Fasting: NEGATIVE mg/dL
Urobilinogen, UA: 1 mg/dL (ref 0.0–1.0)

## 2010-02-22 LAB — GLUCOSE, CAPILLARY

## 2010-02-22 LAB — BASIC METABOLIC PANEL
BUN: 13 mg/dL (ref 6–23)
Calcium: 8.5 mg/dL (ref 8.4–10.5)
Chloride: 99 mEq/L (ref 96–112)
Creatinine, Ser: 0.5 mg/dL (ref 0.4–1.2)
Creatinine, Ser: 0.56 mg/dL (ref 0.4–1.2)
GFR calc Af Amer: >60 mL/min (ref >60)
GFR calc non Af Amer: >60 mL/min (ref >60)
GFR calc non Af Amer: >60 mL/min (ref >60)
Glucose, Bld: 113 mg/dL — ABNORMAL HIGH (ref 70–99)
Glucose, Bld: 109 mg/dL — ABNORMAL HIGH (ref 70–99)
Potassium: 3.9 mEq/L (ref 3.5–5.1)
Potassium: 4.2 mEq/L (ref 3.5–5.1)
Sodium: 133 mEq/L — ABNORMAL LOW (ref 135–145)

## 2010-02-22 LAB — FERRITIN: Ferritin: 383 ng/mL — ABNORMAL HIGH (ref 10–291)

## 2010-02-22 LAB — VITAMIN B12: Vitamin B-12: 382 pg/mL (ref 211–911)

## 2010-02-23 LAB — CBC
MCH: 29.9 pg (ref 26.0–34.0)
MCHC: 32.9 g/dL (ref 30.0–36.0)
MCV: 90.7 fL (ref 78.0–100.0)
MCV: 90.5 fL (ref 78.0–100.0)
Platelets: 324 10*3/uL (ref 150–400)
Platelets: 339 10*3/uL (ref 150–400)
RDW: 12.7 % (ref 11.5–15.5)
RDW: 12.7 % (ref 11.5–15.5)
WBC: 8.9 10*3/uL (ref 4.0–10.5)

## 2010-02-23 LAB — BASIC METABOLIC PANEL
BUN: 11 mg/dL (ref 6–23)
BUN: 11 mg/dL (ref 6–23)
Calcium: 8.7 mg/dL (ref 8.4–10.5)
Creatinine, Ser: 0.48 mg/dL (ref 0.4–1.2)
GFR calc Af Amer: >60 mL/min (ref >60)
GFR calc Af Amer: >60 mL/min (ref >60)
GFR calc non Af Amer: >60 mL/min (ref >60)
GFR calc non Af Amer: >60 mL/min (ref >60)
Potassium: 4.4 mEq/L (ref 3.5–5.1)
Sodium: 134 mEq/L — ABNORMAL LOW (ref 135–145)

## 2010-02-23 LAB — DIFFERENTIAL
Basophils Relative: 0 % (ref 0–1)
Eosinophils Absolute: 0.5 10*3/uL (ref 0.0–0.7)
Eosinophils Relative: 5 % (ref 0–5)
Lymphs Abs: 2 10*3/uL (ref 0.7–4.0)
Monocytes Relative: 8 % (ref 3–12)

## 2010-02-25 ENCOUNTER — Telehealth: Payer: Self-pay | Admitting: Internal Medicine

## 2010-03-03 NOTE — Progress Notes (Signed)
Summary: Katherine [name] pt  Phone Note Call from Patient Call back at [phone]   Caller: Patient Summary of Call: Pt called requesting refill of Oxycodone. pt states she is completely out. Initial call taken by: Brenton Grills MA,  October 01, 2009 1:29 PM  Follow-up for Phone Call        done Follow-up by: Etta Grandchild MD,  October 01, 2009 2:27 PM  Additional Follow-up for Phone Call Additional follow up Details #1::        left vm for pt that rx is ready Additional Follow-up by: Lamar Sprinkles, CMA,  October 01, 2009 2:44 PM    Prescriptions: OXYCODONE-ACETAMINOPHEN 5-325 MG TABS (OXYCODONE-ACETAMINOPHEN) 1  by mouth every 6 hours as needed for pain - to fill Sep 03, 2009  #15 x 0   Entered and Authorized by:   Etta Grandchild MD   Signed by:   Etta Grandchild MD on 10/01/2009   Method used:   Print then Give to Patient   RxID:   [id number]

## 2010-03-03 NOTE — Miscellaneous (Signed)
Summary: Flu Vaccination/[facility]  Flu Vaccination/[facility]   Imported By: Sherian Rein 10/06/2009 15:31:16  _____________________________________________________________________  External Attachment:    Type:   Image     Comment:   External Document
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
## 2010-03-03 NOTE — Progress Notes (Signed)
  Phone Note Refill Request  on May 04, 2009 1:24 PM  Refills Requested: Medication #1:  IBUPROFEN 600 MG  TABS one by mouth every 6 hours as needed   Dosage confirmed as above?Dosage Confirmed   Notes: [facility] Initial call taken by: Scharlene Gloss,  May 04, 2009 1:24 PM    Prescriptions: GLUCOSAMINE CHONDR 500 COMPLEX   CAPS (GLUCOSAMINE-CHONDROIT-VIT C-MN) 1 by mouth tid  #1 bottle x 5   Entered by:   Scharlene Gloss   Authorized by:   Corwin Levins MD   Signed by:   Scharlene Gloss on 05/04/2009   Method used:   Faxed to ...       [facility] (mail-order)       [address] [place], [place]  [address]       Ph: [phone]       Fax: [phone]   RxID:   [id number] IBUPROFEN 600 MG  TABS (IBUPROFEN) one by mouth every 6 hours as needed  #100 x 3   Entered by:   Scharlene Gloss   Authorized by:   Corwin Levins MD   Signed by:   Scharlene Gloss on 05/04/2009   Method used:   Faxed to ...       [facility] (mail-order)       [address] [place], [place]  [address]       Ph: [phone]       Fax: [phone]   RxID:   [id number]

## 2010-03-03 NOTE — Letter (Signed)
Summary: [facility]   Imported By: Sherian Rein 08/05/2009 12:34:10  _____________________________________________________________________  External Attachment:    Type:   Image     Comment:   External Document

## 2010-03-03 NOTE — Progress Notes (Signed)
  Phone Note Refill Request Message from:  Fax from Pharmacy on August 24, 2009 11:49 AM  Refills Requested: Medication #1:  ATROVENT HFA 17 MCG/ACT  AERS 1-2 SPRAYS EACH NOSTRIL BEFORE MEALS   Dosage confirmed as above?Dosage Confirmed   Notes: [facility] Initial call taken by: Robin Ewing CMA (AAMA),  August 24, 2009 11:49 AM    Prescriptions: ATROVENT HFA 17 MCG/ACT  AERS (IPRATROPIUM BROMIDE HFA) 1-2 SPRAYS EACH NOSTRIL BEFORE MEALS  #15 x 5   Entered by:   Scharlene Gloss CMA (AAMA)   Authorized by:   Corwin Levins MD   Signed by:   Scharlene Gloss CMA (AAMA) on 08/24/2009   Method used:   Faxed to ...       [facility] (mail-order)       [address] [place], [place]  [address]       Ph: [phone]       Fax: [phone]   RxID:   [id number]

## 2010-03-03 NOTE — Progress Notes (Signed)
  Phone Note Refill Request Message from:  Fax from Pharmacy on February 25, 2010 11:26 AM  Refills Requested: Medication #1:  FOSAMAX 35 MG  TABS WEEKLY   Dosage confirmed as above?Dosage Confirmed   Notes: [facility] Fax#[phone] Initial call taken by: Robin Ewing CMA (AAMA),  February 25, 2010 11:27 AM    Prescriptions: FOSAMAX 35 MG  TABS (ALENDRONATE SODIUM) WEEKLY  #4 x 10   Entered by:   Zella Ball Ewing CMA (AAMA)   Authorized by:   Corwin Levins MD   Signed by:   Scharlene Gloss CMA (AAMA) on 02/25/2010   Method used:   Faxed to ...       [facility] (mail-order)       [address] [place], [place]  [address]       Ph: [phone]       Fax: [phone]   RxID:   [id number]
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
## 2010-03-03 NOTE — Letter (Signed)
Summary: [facility]   Imported By: Sherian Rein 02/15/2010 09:05:22  _____________________________________________________________________  External Attachment:    Type:   Image     Comment:   External Document

## 2010-03-03 NOTE — Progress Notes (Signed)
  Phone Note Refill Request  on July 29, 2009 11:47 AM  Refills Requested: Medication #1:  SIMVASTATIN 40 MG TABS 1po once daily   Dosage confirmed as above?Dosage Confirmed   Notes: [facility] Initial call taken by: Robin Ewing CMA Duncan Dull),  July 29, 2009 11:47 AM    Prescriptions: SIMVASTATIN 40 MG TABS (SIMVASTATIN) 1po once daily  #30 x 11   Entered by:   Zella Ball Ewing CMA (AAMA)   Authorized by:   Corwin Levins MD   Signed by:   Scharlene Gloss CMA (AAMA) on 07/29/2009   Method used:   Faxed to ...       [facility] (mail-order)       [address] [place], [place]  [address]       Ph: [phone]       Fax: [phone]   RxID:   [id number]

## 2010-03-03 NOTE — Letter (Signed)
Summary: [facility]   Imported By: Sherian Rein 09/16/2009 13:59:28  _____________________________________________________________________  External Attachment:    Type:   Image     Comment:   External Document

## 2010-03-03 NOTE — Letter (Signed)
Summary: [facility]   Imported By: Sherian Rein 09/08/2009 09:24:04  _____________________________________________________________________  External Attachment:    Type:   Image     Comment:   External Document

## 2010-03-03 NOTE — Progress Notes (Signed)
  Phone Note Refill Request  on July 16, 2009 1:50 PM  Refills Requested: Medication #1:  FOSAMAX 35 MG  TABS WEEKLY   Dosage confirmed as above?Dosage Confirmed   Last Refilled: 11/11/2008   Notes: [facility] Initial call taken by: Scharlene Gloss,  July 16, 2009 1:50 PM    Prescriptions: FOSAMAX 35 MG  TABS (ALENDRONATE SODIUM) WEEKLY  #4 x 6   Entered by:   Scharlene Gloss   Authorized by:   Corwin Levins MD   Signed by:   Scharlene Gloss on 07/16/2009   Method used:   Faxed to ...       [facility] (mail-order)       [address] [place], [place]  [address]       Ph: [phone]       Fax: [phone]   RxID:   [id number]

## 2010-03-03 NOTE — Letter (Signed)
Summary: Generic Letter  [facility]  [address] [place], [place] [address]   Phone: [phone]  Fax: [phone]    02/17/2009  Katherine [name] [address] [place], [place]  [address] DOB:  1945/07/08  Dear [name],      This letter is in support of your request to be   considered to be approved for [employer] transportation.  This  application should be approved due to your Severe Health  Condition which is permanent, namely severe generalized   arthritis which makes it impossible for you to access and   use the regular [place] bus system.         Sincerely,   Oliver Barre MD
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
## 2010-03-03 NOTE — Progress Notes (Signed)
Summary: form   Phone Note Call from Patient Call back at Home Phone [phone]   Caller: Patient Call For: Corwin Levins MD Summary of Call: a fax is on its way to Dr Marisue Ivan [facility] requseting diabetic shoes ...................on dr Jonny Ruiz cart Initial call taken by: Shelbie Proctor,  July 21, 2008 2:03 PM  Follow-up for Phone Call        form done hardcopy to [facility] side B - debra  Follow-up by: Corwin Levins MD,  July 21, 2008 5:53 PM  Additional Follow-up for Phone Call Additional follow up Details #1::        faxed  form back  Additional Follow-up by: Shelbie Proctor,  July 22, 2008 8:19 AM

## 2010-03-03 NOTE — Letter (Signed)
Summary: [facility]   Imported By: Sherian Rein 01/28/2010 16:18:26  _____________________________________________________________________  External Attachment:    Type:   Image     Comment:   External Document

## 2010-03-03 NOTE — Progress Notes (Signed)
  Phone Note Refill Request  on April 07, 2009 9:46 AM  Refills Requested: Medication #1:  ASTELIN 137 MCG/SPRAY  SOLN 2 spray each nostril once daily as needed   Dosage confirmed as above?Dosage Confirmed   Notes: [facility] Initial call taken by: Scharlene Gloss,  April 07, 2009 9:46 AM    Prescriptions: ASTELIN 137 MCG/SPRAY  SOLN (AZELASTINE HCL) 2 spray each nostril once daily as needed  #1 x 2   Entered by:   Scharlene Gloss   Authorized by:   Corwin Levins MD   Signed by:   Scharlene Gloss on 04/07/2009   Method used:   Faxed to ...       [facility] (retail)       [address]       [place], [place]  [address]       Ph: [phone]       Fax: [phone]   RxID:   [id number]

## 2010-03-03 NOTE — Progress Notes (Signed)
Summary: Oxycodone  Phone Note Call from Patient Call back at Home Phone [phone]   Caller: Patient Summary of Call: Pt called requesting refill of Oxycodone Initial call taken by: Margaret Pyle, CMA,  September 03, 2009 4:11 PM  Follow-up for Phone Call        done hardcopy to [facility] side B - dahlia  Follow-up by: Corwin Levins MD,  September 03, 2009 4:18 PM  Additional Follow-up for Phone Call Additional follow up Details #1::        Pt informed, Rx in cabinet for pt pick up Additional Follow-up by: Margaret Pyle, CMA,  September 03, 2009 4:21 PM    New/Updated Medications: OXYCODONE-ACETAMINOPHEN 5-325 MG TABS (OXYCODONE-ACETAMINOPHEN) 1  by mouth every 6 hours as needed for pain - to fill Sep 03, 2009 Prescriptions: OXYCODONE-ACETAMINOPHEN 5-325 MG TABS (OXYCODONE-ACETAMINOPHEN) 1  by mouth every 6 hours as needed for pain - to fill Sep 03, 2009  #120 x 0   Entered and Authorized by:   Corwin Levins MD   Signed by:   Corwin Levins MD on 09/03/2009   Method used:   Print then Give to Patient   RxID:   [id number]

## 2010-03-03 NOTE — Letter (Signed)
Summary: [facility] forms/[facility]  [facility] forms/[facility]   Imported By: Sherian Rein 02/19/2009 07:25:18  _____________________________________________________________________  External Attachment:    Type:   Image     Comment:   External Document

## 2010-03-03 NOTE — Letter (Signed)
Summary: [employer]/James Crawford MD  [employer]/James Crawford MD   Imported By: Sherian Rein 11/03/2009 10:23:17  _____________________________________________________________________  External Attachment:    Type:   Image     Comment:   External Document

## 2010-03-03 NOTE — Letter (Signed)
Summary: [facility]   Imported By: Lester Gilmore 08/25/2009 08:05:15  _____________________________________________________________________  External Attachment:    Type:   Image     Comment:   External Document

## 2010-03-03 NOTE — Progress Notes (Signed)
Summary: [facility]  Phone Note Outgoing Call   Summary of Call: Paperwork for [facility] faxed yesterday (02/17/2009). Copy made and sent to scan, original in cabinet for pt pick up. I attempted to call pt several times yesterday and today, number busy. I will send a letter informing pt. Initial call taken by: Margaret Pyle, CMA,  February 18, 2009 10:56 AM

## 2010-03-03 NOTE — Progress Notes (Signed)
  Phone Note Refill Request  on Jun 23, 2009 4:05 PM  Refills Requested: Medication #1:  CETIRIZINE HCL 10 MG TABS 1 by mouth once daily as needed   Dosage confirmed as above?Dosage Confirmed   Notes: [facility] Initial call taken by: Scharlene Gloss,  Jun 23, 2009 4:05 PM    Prescriptions: CETIRIZINE HCL 10 MG TABS (CETIRIZINE HCL) 1 by mouth once daily as needed  #30 x 6   Entered by:   Scharlene Gloss   Authorized by:   Corwin Levins MD   Signed by:   Scharlene Gloss on 06/23/2009   Method used:   Faxed to ...       [facility] [employer] (mail-order)       [address] [place], [place]  [address]       Ph: [phone]       Fax: [phone]   RxID:   [id number]
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
## 2010-03-03 NOTE — Progress Notes (Signed)
  Phone Note Refill Request Message from:  Fax from Pharmacy on December 14, 2009 3:54 PM  Refills Requested: Medication #1:  NASONEX 50 MCG/ACT  SUSP 2 spray each nostril once daily   Dosage confirmed as above?Dosage Confirmed   Last Refilled: 05/20/2009   [facility] Initial call taken by: Robin Ewing CMA Duncan Dull),  December 14, 2009 3:54 PM    Prescriptions: NASONEX 50 MCG/ACT  SUSP (MOMETASONE FUROATE) 2 spray each nostril once daily  #17 x 10   Entered by:   Zella Ball Ewing CMA (AAMA)   Authorized by:   Corwin Levins MD   Signed by:   Scharlene Gloss CMA (AAMA) on 12/14/2009   Method used:   Faxed to ...       [facility] (mail-order)       [address] [place], [place]  [address]       Ph: [phone]       Fax: [phone]   RxID:   [id number]

## 2010-03-03 NOTE — Progress Notes (Signed)
  Phone Note Refill Request  on February 02, 2009 12:31 PM  Refills Requested: Medication #1:  OMEPRAZOLE 20 MG  TBEC Take 1 tablet by mouth two times a day   Dosage confirmed as above?Dosage Confirmed   Notes: [facility] Initial call taken by: Scharlene Gloss,  February 02, 2009 12:31 PM    Prescriptions: OMEPRAZOLE 20 MG  TBEC (OMEPRAZOLE) Take 1 tablet by mouth two times a day  #60 x 6   Entered by:   Scharlene Gloss   Authorized by:   Corwin Levins MD   Signed by:   Scharlene Gloss on 02/02/2009   Method used:   Faxed to ...       [facility] (retail)       [address]       [place], [place]  [address]       Ph: [phone]       Fax: [phone]   RxID:   [id number]

## 2010-03-03 NOTE — Letter (Signed)
Summary: Generic Letter  [facility]  [address] [place], [place] [address]   Phone: [phone]  Fax: [phone]    02/18/2009  [facility] [address] [place], [place]  [address]  Dear Ms. Moure,     Our office has tried to contact you, but we were unsuccessful at reaching you. We have received and completed your application for [employer] transportation, as well as faxing it to their office. We tried to contact you to inform you of this and to let you know that we have the original application at out front office for you pick up.   Please contact us if you have a change in contact information so that we may be able to reach you in the future. If you have any further questions or concerns, you may contact us at the above number.   Thank you.         Sincerely,   Margaret Pyle, CMA  Appended Document: Generic Letter paperwork mailed to pt per pt request

## 2010-03-03 NOTE — Progress Notes (Signed)
  Phone Note Refill Request  on May 20, 2009 8:08 AM  Refills Requested: Medication #1:  NASONEX 50 MCG/ACT  SUSP 2 spray each nostril once daily   Dosage confirmed as above?Dosage Confirmed   Notes: [facility] Initial call taken by: Scharlene Gloss,  May 20, 2009 8:08 AM    Prescriptions: NASONEX 50 MCG/ACT  SUSP (MOMETASONE FUROATE) 2 spray each nostril once daily  #17 x 5   Entered by:   Scharlene Gloss   Authorized by:   Corwin Levins MD   Signed by:   Scharlene Gloss on 05/20/2009   Method used:   Faxed to ...       [facility] (mail-order)       [address] [place], [place]  [address]       Ph: [phone]       Fax: [phone]   RxID:   [id number]

## 2010-03-03 NOTE — Progress Notes (Signed)
Summary: REFILLS  Phone Note Call from Patient Call back at [facility] Phone [phone]   Caller: Patient Summary of Call: Katherine [name] CALLED TO REQUEST IBUPROFEN 600 MG AND DUPLEX ( SHE WAS UNSURE ABOUT THIS ONE AND DIDN'T KNOW THE SPELLING)  SHE SAYS HER PHARMACY HAS REQUESTED THESE.  THE PHARMACY NUMBER IS [phone].  CALL HER FRIEND FAYE HARLEY TO LET HER KNOW IT IS READY AT [phone]. Initial call taken by: Hilarie Fredrickson,  February 03, 2009 11:14 AM  Follow-up for Phone Call        ?Duplex? I am unsure if that is supposed to be Celebrex. Is it ok to refill patient meds, please advise. Follow-up by: Lucious Groves,  February 03, 2009 3:59 PM  Additional Follow-up for Phone Call Additional follow up Details #1::        please call pt's pharmacy as in her case it could be fosamax, norvasc, nasonex or the celebrex - all of which sound somewhat similar Additional Follow-up by: Corwin Levins MD,  February 03, 2009 5:17 PM    Additional Follow-up for Phone Call Additional follow up Details #2::    Called patient pharmacy and patient does not need any refills. What was needed was already refilled and has refills left on it. Notified patient friend Lucendia Herrlich. Follow-up by: Lucious Groves,  February 04, 2009 9:42 AM  Additional Follow-up for Phone Call Additional follow up Details #3:: Details for Additional Follow-up Action Taken: noted Additional Follow-up by: Corwin Levins MD,  February 04, 2009 10:12 AM

## 2010-03-03 NOTE — Progress Notes (Signed)
  Phone Note Refill Request Message from:  Fax from Pharmacy on August 20, 2009 7:58 AM  Refills Requested: Medication #1:  OMEPRAZOLE 20 MG  TBEC Take 1 tablet by mouth two times a day   Dosage confirmed as above?Dosage Confirmed   Last Refilled: 02/02/2009   Notes: [facility]  Medication #2:  ASTELIN 137 MCG/SPRAY  SOLN 2 spray each nostril once daily as needed   Dosage confirmed as above?Dosage Confirmed   Last Refilled: 04/07/2009   Notes: [facility] Initial call taken by: Robin Ewing CMA (AAMA),  August 20, 2009 7:59 AM    Prescriptions: ASTELIN 137 MCG/SPRAY  SOLN (AZELASTINE HCL) 2 spray each nostril once daily as needed  #1 x 10   Entered by:   Zella Ball Ewing CMA (AAMA)   Authorized by:   Corwin Levins MD   Signed by:   Scharlene Gloss CMA (AAMA) on 08/20/2009   Method used:   Faxed to ...       [facility] (mail-order)       [address] [place], [place]  [address]       Ph: [phone]       Fax: [phone]   RxID:   [id number] OMEPRAZOLE 20 MG  TBEC (OMEPRAZOLE) Take 1 tablet by mouth two times a day  #60 x 10   Entered by:   Zella Ball Ewing CMA (AAMA)   Authorized by:   Corwin Levins MD   Signed by:   Scharlene Gloss CMA (AAMA) on 08/20/2009   Method used:   Faxed to ...       [facility] (mail[facility]order)       [address] [place], [place]  [address]       Ph: [phone]       Fax: [phone]   RxID:   [id number]

## 2010-03-03 NOTE — Progress Notes (Signed)
  Phone Note Refill Request  on April 07, 2009 9:29 AM  Refills Requested: Medication #1:  CARDURA 8 MG  TABS 1/2 by mouth once daily   Dosage confirmed as above?Dosage Confirmed   Notes: [facility] Initial call taken by: Scharlene Gloss,  April 07, 2009 9:30 AM    Prescriptions: CARDURA 8 MG  TABS (DOXAZOSIN MESYLATE) 1/2 by mouth once daily  #30 x 10   Entered by:   Scharlene Gloss   Authorized by:   Corwin Levins MD   Signed by:   Scharlene Gloss on 04/07/2009   Method used:   Faxed to ...       [facility] (retail)       [address]       [place], [place]  [address]       Ph: [phone]       Fax: [phone]   RxID:   [id number]

## 2010-03-07 ENCOUNTER — Telehealth: Payer: Self-pay | Admitting: Internal Medicine

## 2010-03-17 NOTE — Progress Notes (Signed)
Summary: rx refill req  Phone Note Refill Request Message from:  Fax from Pharmacy on March 07, 2010 11:39 AM  Refills Requested: Medication #1:  FEXOFENADINE HCL 180 MG TABS 1po once daily as needed allergies   Dosage confirmed as above?Dosage Confirmed   Last Refilled: 11/01/2009  Medication #2:  ATROVENT HFA 17 MCG/ACT  AERS 1-2 SPRAYS EACH NOSTRIL BEFORE MEALS   Dosage confirmed as above?Dosage Confirmed   Last Refilled: 08/24/2009  Method Requested: Electronic Next Appointment Scheduled: 08/05/2010 Initial call taken by: Brenton Grills CMA (AAMA),  March 07, 2010 11:40 AM    Prescriptions: FEXOFENADINE HCL 180 MG TABS (FEXOFENADINE HCL) 1po once daily as needed allergies  #30 x 11   Entered by:   Brenton Grills CMA (AAMA)   Authorized by:   Corwin Levins MD   Signed by:   Brenton Grills CMA (AAMA) on 03/07/2010   Method used:   Faxed to ...       [facility] (mail-order)       [address] [place], [place]  [address]       Ph: [phone]       Fax: [phone]   RxID:   [id number] ATROVENT HFA 17 MCG/ACT  AERS (IPRATROPIUM BROMIDE HFA) 1-2 SPRAYS EACH NOSTRIL BEFORE MEALS  #15 x 4   Entered by:   Brenton Grills CMA (AAMA)   Authorized by:   Corwin Levins MD   Signed by:   Brenton Grills CMA (AAMA) on 03/07/2010   Method used:   Faxed to ...       [facility] (mail-order)       [address] [place], [place]  [address]       Ph: [phone]       Fax: [phone]   RxID:   [id number]

## 2010-04-06 ENCOUNTER — Telehealth: Payer: Self-pay | Admitting: Internal Medicine

## 2010-04-06 DIAGNOSIS — R6251 Failure to thrive (child): Secondary | ICD-10-CM | POA: Insufficient documentation

## 2010-04-07 ENCOUNTER — Encounter: Payer: Self-pay | Admitting: Internal Medicine

## 2010-04-07 NOTE — Discharge Summary (Addendum)
Katherine [name], Katherine [name]             ACCOUNT NO.:  [id number]  MEDICAL RECORD NO.:  [id number]          PATIENT TYPE:  INP  LOCATION:  5030                         FACILITY:  [facility]  PHYSICIAN:  Kela Millin, M.D.DATE OF BIRTH:  Apr 25, 1945  DATE OF ADMISSION:  02/19/2010 DATE OF DISCHARGE:  02/24/2010                        DISCHARGE SUMMARY - REFERRING   DISCHARGE DIAGNOSES: 1. Right pubic ramus fracture. 2. Iron-deficiency anemia -- hemoglobin stable, 10.3 prior to     discharge. 3. Hypertension. 4. Constipation. 5. Gastroesophageal reflux disease. 6. Diet controlled diabetes mellitus. 7. Hyperlipidemia. 8. History of arthritis of the left knee. 9. History of fall. 10.Leukocytosis, reactive -- resolved.  PROCEDURES AND STUDIES: 1. Bilateral hip x-rays -- cannot exclude a subtle fracture of the     right pubic bone adjacent to the pubic symphysis.  Suggest     correlation with the site of the patient's pain.2. Chest x-ray on December 23, 2010 -- borderline enlargement of the     cardiac silhouette.  No acute process seen.  Chronic findings are     stable.  CONSULTATIONS:  Dr. Lajoyce Corners with [facility].  BRIEF HISTORY:  The patient is a pleasant 65 year old white female with above listed medical problems including a history of recurrent falls for the 2-3 weeks prior to this presentation, which were described as accidental who presented with complaints of back and right hip pain. She stated that she had been having increasing pain over her back, both knees and especially the left one and the right hip.  It was reported that she fell the week prior to this presentation after she tripped. Because of worsening pain, she came to the ED and x-rays of her hips were done and the results as stated above consistent with right pubic ramus fracture and she was admitted for further evaluation and management.  It was noted that the patient had been seeing an  outpatient orthopedic doctor for knee pain and was being considered for a total knee replacement on the left some time in the future.  The patient denied chest pain, shortness of breath, nausea, vomiting, and no diaphoresis.  HOSPITAL COURSE: 1. Right pubic ramus fracture -- upon admission, the patient was     started on narcotics -- IV and oral for pain management.  PT/OT was     consulted and orthopedics was consulted as well.  Dr. Lajoyce Corners saw the     patient and recommended weightbearing as tolerated and skilled     nursing for rehab.  She stated that the patient should follow up     with him in 2 weeks following her discharge.  [name] pain     medications were adjusted and at this time, her pain is better     controlled on the oral narcotics.  She will be discharged for     outpatient followup at this time.  PT/OT were consulted and saw the     patient in the hospital and recommended skilled nursing as well. 2. Hypertension -- she was maintained on her doxazosin during this     hospital stay and is to continue it upon discharge.3.  Constipation -- the impression was that this was secondary to her     narcotics as well as the iron supplements, which she has been on     for history of iron-deficiency anemia.  She was treated with     MiraLax and p.r.n. enemas during this hospital stay and she is to     continue upon discharge. 4. Hyperlipidemia -- the patient reported a prior history of     hyperlipidemia and her lipid profile was done on admission and     revealed a triglyceride level of 156.  Her LDL of 71 and HDL 36.5.     The patient is to continue her Zocor upon discharge. 5. History of iron-deficiency anemia -- the patient did not have any     evidence of bleeding during this hospital stay and was maintained     on iron supplementation.  An anemia panel was done and it came back     with her iron level of less than 10.  Her B12 was 382.  Her     hemoglobin remained stable --  10.3 prior to discharge. 6. History of diet-controlled diabetes -- the patient has been on no     medications and her blood sugars were ranging in the 90s to low     106s during this hospital stay.  She is to continue a modified     carbohydrate diet upon discharge.  DISCHARGE MEDICATIONS: 1. Tylenol 650 mg q.4 h. p.r.n. 2. Ocuvite 1 tablet p.o. b.i.d. 3. Calcium carbonate 1 tablet p.o. daily. 4. Fleet Enema daily p.r.n. constipation. 5. Iron complex 150 mg p.o. daily at bedtime. 6. OxyContin SR 20 mg p.o. b.i.d. 7. MiraLax 17 g p.o. daily, hold if diarrhea. 8. Zocor 40 mg p.o. at bedtime. 9. Vicodin 1 tablet q.4-6 h. p.r.n. 10.Nitrofurantoin 100 mg 1 tablet p.o. daily. 11.Alendronate 35 mg every week. 12.Aspirin 81 mg p.o. daily. 13.Azelastine 2 sprays b.i.d. p.r.n. 14.Benztropine 0.5 mg p.o. b.i.d. 15.Celebrex 200 mg p.o. b.i.d. 16.Zyrtec 10 mg p.o. daily p.r.n. 17.Doxazosin 8 mg half a tablet p.o. daily. 18.I-Vite over-the-counter 1 capsule b.i.d. 19.Glucosamine over-the-counter 1 tablet daily. 20.Ipratropium nasal spray 1-2 sprays nasally t.i.d. p.r.n. as     previously. 21.Methocarbamol 500 mg 1 tablet q.6 h. p.r.n.22.Nasonex 2 sprays daily p.r.n. and Nasonex 1 spray in each nostril     daily. 23.Omeprazole 20 mg 1 capsule b.i.d. 24.Pataday 0.2% 1 drop in both eyes daily. 25.Risperidone 3 mg 1 tablet at bedtime. 26.Saline nasal spray, 1 spray in each nostril at bedtime.  FOLLOWUP CARE: 1. Nursing home physician in 1-2 days. 2. Dr. Lajoyce Corners with [facility] in 2 weeks, call for     appointment.  DISCHARGE CONDITION:  Improved/Stable.     Kela Millin, M.D.     ACV/MEDQ  D:  02/24/2010  T:  02/24/2010  Job:  [id number]  cc:   Corwin Levins, MD  Electronically Signed by Donnalee Curry M.D. on 04/05/2010 05:21:16 PM Electronically Signed by Donnalee Curry M.D. on 04/05/2010 05:21:16 PM Electronically Signed by Donnalee Curry M.D. on 04/05/2010 05:47:13  PM Electronically Signed by Donnalee Curry M.D. on 04/05/2010 07:32:29 PM

## 2010-04-12 NOTE — Progress Notes (Signed)
Summary: Home Health Referral   Phone Note Call from Patient Call back at Home Phone [phone]   Summary of Call: Pt left vm - She is out of the hosptial and req referral for home health services.  Initial call taken by: Lamar Sprinkles, CMA,  April 06, 2010 3:04 PM  Follow-up for Phone Call        ok - will do Follow-up by: Corwin Levins MD,  April 06, 2010 5:38 PM  Additional Follow-up for Phone Call Additional follow up Details #1::        Pt advised that she has been referred to [facility] Additional Follow-up by: Margaret Pyle, CMA,  April 07, 2010 8:39 AM  New Problems: FAILURE TO THRIVE (ICD-783.41)   New Problems: FAILURE TO THRIVE (ICD-783.41)

## 2010-04-12 NOTE — Miscellaneous (Signed)
Summary: [facility] Referral  [facility] Referral   Imported By: Lennie Odor 04/08/2010 11:08:40  _____________________________________________________________________  External Attachment:    Type:   Image     Comment:   External Document

## 2010-04-15 ENCOUNTER — Telehealth: Payer: Self-pay | Admitting: Internal Medicine

## 2010-04-15 ENCOUNTER — Other Ambulatory Visit: Payer: Self-pay | Admitting: Internal Medicine

## 2010-04-15 ENCOUNTER — Encounter: Payer: Self-pay | Admitting: Internal Medicine

## 2010-04-15 ENCOUNTER — Ambulatory Visit (INDEPENDENT_AMBULATORY_CARE_PROVIDER_SITE_OTHER): Payer: 59 | Admitting: Internal Medicine

## 2010-04-15 ENCOUNTER — Other Ambulatory Visit: Payer: 59

## 2010-04-15 DIAGNOSIS — E119 Type 2 diabetes mellitus without complications: Secondary | ICD-10-CM

## 2010-04-15 DIAGNOSIS — I10 Essential (primary) hypertension: Secondary | ICD-10-CM

## 2010-04-15 DIAGNOSIS — S329XXA Fracture of unspecified parts of lumbosacral spine and pelvis, initial encounter for closed fracture: Secondary | ICD-10-CM | POA: Insufficient documentation

## 2010-04-15 DIAGNOSIS — R5381 Other malaise: Secondary | ICD-10-CM | POA: Insufficient documentation

## 2010-04-15 DIAGNOSIS — R300 Dysuria: Secondary | ICD-10-CM | POA: Insufficient documentation

## 2010-04-15 DIAGNOSIS — R5383 Other fatigue: Secondary | ICD-10-CM

## 2010-04-15 LAB — CBC WITH DIFFERENTIAL/PLATELET
Basophils Absolute: 0 10*3/uL (ref 0.0–0.1)
Basophils Relative: 0 % (ref 0.0–3.0)
Eosinophils Relative: 0.7 % (ref 0.0–5.0)
Hemoglobin: 12.5 g/dL (ref 12.0–15.0)
Lymphocytes Relative: 10.5 % — ABNORMAL LOW (ref 12.0–46.0)
Monocytes Relative: 5.3 % (ref 3.0–12.0)
Neutro Abs: 8.4 10*3/uL — ABNORMAL HIGH (ref 1.4–7.7)
RBC: 4 Mil/uL (ref 3.87–5.11)
RDW: 13.9 % (ref 11.5–14.6)
WBC: 10 10*3/uL (ref 4.5–10.5)

## 2010-04-15 LAB — URINALYSIS, ROUTINE W REFLEX MICROSCOPIC
Bilirubin Urine: NEGATIVE
Ketones, ur: NEGATIVE
Leukocytes, UA: NEGATIVE
Total Protein, Urine: NEGATIVE
pH: 7 (ref 5.0–8.0)

## 2010-04-15 LAB — LIPID PANEL
HDL: 50.1 mg/dL (ref >39.00)
VLDL: 32 mg/dL (ref 0.0–40.0)

## 2010-04-15 LAB — BASIC METABOLIC PANEL
Calcium: 8.9 mg/dL (ref 8.4–10.5)
GFR: 134.87 mL/min (ref >60.00)
Potassium: 4 mEq/L (ref 3.5–5.1)
Sodium: 133 mEq/L — ABNORMAL LOW (ref 135–145)

## 2010-04-15 LAB — HEPATIC FUNCTION PANEL
ALT: 17 U/L (ref 0–35)
AST: 27 U/L (ref 0–37)
Albumin: 4.2 g/dL (ref 3.5–5.2)
Alkaline Phosphatase: 110 U/L (ref 39–117)

## 2010-04-15 LAB — MICROALBUMIN / CREATININE URINE RATIO: Microalb Creat Ratio: 2.9 mg/g (ref 0.0–30.0)

## 2010-04-15 LAB — HEMOGLOBIN A1C: Hgb A1c MFr Bld: 5.5 % (ref 4.6–6.5)

## 2010-04-18 LAB — PHOSPHORUS
Phosphorus: 1.8 mg/dL — ABNORMAL LOW (ref 2.3–4.6)
Phosphorus: 2.8 mg/dL (ref 2.3–4.6)

## 2010-04-18 LAB — BASIC METABOLIC PANEL
BUN: 4 mg/dL — ABNORMAL LOW (ref 6–23)
CO2: 26 mEq/L (ref 19–32)
CO2: 27 mEq/L (ref 19–32)
CO2: 28 mEq/L (ref 19–32)
Calcium: 7.5 mg/dL — ABNORMAL LOW (ref 8.4–10.5)
Chloride: 105 mEq/L (ref 96–112)
Chloride: 106 mEq/L (ref 96–112)
Chloride: 106 mEq/L (ref 96–112)
Chloride: 98 mEq/L (ref 96–112)
Creatinine, Ser: 0.44 mg/dL (ref 0.4–1.2)
Creatinine, Ser: 0.48 mg/dL (ref 0.4–1.2)
GFR calc Af Amer: >60 mL/min (ref >60)
GFR calc Af Amer: >60 mL/min (ref >60)
GFR calc Af Amer: >60 mL/min (ref >60)
GFR calc Af Amer: >60 mL/min (ref >60)
GFR calc non Af Amer: >60 mL/min (ref >60)
Potassium: 3.7 mEq/L (ref 3.5–5.1)
Sodium: 135 mEq/L (ref 135–145)
Sodium: 138 mEq/L (ref 135–145)

## 2010-04-18 LAB — DIFFERENTIAL
Basophils Relative: 0 % (ref 0–1)
Eosinophils Absolute: 0 10*3/uL (ref 0.0–0.7)
Monocytes Absolute: 0.9 10*3/uL (ref 0.1–1.0)
Monocytes Relative: 8 % (ref 3–12)

## 2010-04-18 LAB — GLUCOSE, CAPILLARY
Glucose-Capillary: 101 mg/dL — ABNORMAL HIGH (ref 70–99)
Glucose-Capillary: 117 mg/dL — ABNORMAL HIGH (ref 70–99)
Glucose-Capillary: 125 mg/dL — ABNORMAL HIGH (ref 70–99)
Glucose-Capillary: 140 mg/dL — ABNORMAL HIGH (ref 70–99)
Glucose-Capillary: 152 mg/dL — ABNORMAL HIGH (ref 70–99)

## 2010-04-18 LAB — MAGNESIUM: Magnesium: 2 mg/dL (ref 1.5–2.5)

## 2010-04-18 LAB — CBC
HCT: 40.3 % (ref 36.0–46.0)
Hemoglobin: 13.9 g/dL (ref 12.0–15.0)
MCHC: 34.4 g/dL (ref 30.0–36.0)
MCV: 93.8 fL (ref 78.0–100.0)
RBC: 4.3 MIL/uL (ref 3.87–5.11)

## 2010-04-19 DIAGNOSIS — I10 Essential (primary) hypertension: Secondary | ICD-10-CM

## 2010-04-19 DIAGNOSIS — IMO0001 Reserved for inherently not codable concepts without codable children: Secondary | ICD-10-CM

## 2010-04-19 DIAGNOSIS — E119 Type 2 diabetes mellitus without complications: Secondary | ICD-10-CM
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
## 2010-04-22 ENCOUNTER — Telehealth: Payer: Self-pay

## 2010-04-22 NOTE — Telephone Encounter (Signed)
Message copied by Margaret Pyle on Fri Apr 22, 2010  1:10 PM ------      Message from: Lamar Sprinkles      Created: Fri Apr 22, 2010 12:52 PM       Pt called about her tramadol again... See last phone note in EMR - she wants to know when MD is going to send in RX

## 2010-04-22 NOTE — Telephone Encounter (Signed)
Pt was called and I explained to her that she was Rx's Tramadol XR 200mg  qd at her last OV which is more medication than the Tramadol 50mg  tid she had previously been taking. Pt states that she picked up refill for old Rx and will call pharmacy and advise that ER Tramadol was e-scribed. Pt was satisfied with explaination

## 2010-04-27 ENCOUNTER — Other Ambulatory Visit: Payer: Self-pay | Admitting: Internal Medicine

## 2010-04-28 NOTE — Progress Notes (Signed)
Summary: Tramadol  Phone Note Call from Patient Call back at [facility] Phone [phone]   Summary of Call: Pt left vm - says MD was going to give her stronger dose of tramadol and higher qty, Please adivse.  Initial call taken by: Lamar Sprinkles, CMA,  April 15, 2010 4:02 PM  Follow-up for Phone Call        I did b/c the tramadol is total of 200mg  over 24 hrs;  whereas before she was taking about 2-3 per day of the 50 mg  Follow-up by: Corwin Levins MD,  April 15, 2010 4:32 PM  Additional Follow-up for Phone Call Additional follow up Details #1::        Pt advised of above and understood Additional Follow-up by: Margaret Pyle, CMA,  April 18, 2010 2:55 PM

## 2010-05-18 ENCOUNTER — Other Ambulatory Visit: Payer: Self-pay

## 2010-05-18 MED ORDER — CELECOXIB 200 MG PO CAPS: 200.0000 mg | ORAL_CAPSULE | Freq: Two times a day (BID) | ORAL | Status: DC

## 2010-06-14 NOTE — H&P (Signed)
Katherine [name], WALSKI             ACCOUNT NO.:  [id number]   MEDICAL RECORD NO.:  [id number]          PATIENT TYPE:  AMB   LOCATION:  DAY                          FACILITY:  [facility]   PHYSICIAN:  Dois Davenport A. Rivard, M.D. DATE OF BIRTH:  09/03/1945   DATE OF ADMISSION:  DATE OF DISCHARGE:                              HISTORY & PHYSICAL   HISTORY OF PRESENT ILLNESS:  Katherine [name] is a 65 year old, single,  white female who presents for robot-assisted subtotal hysterectomy with  sacral colpopexy and placement of tension-free vaginal tape because of  complete pelvic prolapse and stress urinary incontinence.  For many  years, the patient reports that she has had a sensation of fullness in  her lower pelvic area accompanied by leaking of urine whenever she  coughs or sneezes.  For the past year, she has worn a Gelhorn pessary  with good results.  However, at this time, she wishes to proceed with  definitive therapy in the form of surgery.  She denies any dysuria,  incomplete emptying of her bladder, nocturia or changes in her bowel  habits.  However, she does occasionally have lower back pain and as  previously mentioned will leak urine whenever she coughs, laughs or  sneezes.  The patient is aware of the medical management options  available to her to include observation.  However, she has decided to  proceed with surgical management in the form of a subtotal hysterectomy  followed by sacral colpopexy and placement of tension-free vaginal tape.   OB HISTORY:  Gravida 0.   GYN HISTORY:  Menarche at 65 years old.  The patient is menopausal.  Has  no history of sexually transmitted diseases.  Does have a history of  abnormal Pap smear for which she had a colposcopy in 2003.  However,  since that time, they have remained normal with her last one being in  January 2008.   PAST MEDICAL HISTORY:  1. Rheumatoid arthritis.  2. Hypertension.  3. Borderline diabetes mellitus.  4. Anxiety.  5.  Other mental issues.   SURGICAL HISTORY:  1. Tonsillectomy.  2. Breast excision (findings benign).  3. The patient does have a history of blood transfusions.  However she      does not recall when it was.  Denies any problems with anesthesia.   FAMILY HISTORY:  Cardiovascular disease, hypertension, mental illness,  breast cancer (a niece) and Hodgkin's lymphoma (sister).   SOCIAL HISTORY:  The patient lives alone and has an Geophysicist/field seismologist who comes  in daily to help her by the name of [name].   HABITS:  She does not consume alcohol or tobacco.   CURRENT MEDICATIONS:  1. Risperdal 3 mg daily.  2. Nabumetone 750 mg twice daily as needed.  3. Simvastatin 20 mg daily.  4. Omeprazole 20 mg daily.  5. Atenolol 50 mg 1/2 tablet daily.  6. Amlodipine 5 mg daily.  7. Nasonex 2 sprays in each nostril twice daily.  8. Benadryl 25 mg at bedtime.  9. Astelin 1 spray each nostril at bedtime.  10.Optivar 0.05% ophthalmic solution as needed.  11.Ipratropium bromide  0.06% nasally three times daily before each      meal.  12.Doxazosin mesylate 8 mg at bedtime.  She is take 1/2 tablet.  13.Benadryl 25 mg at bedtime.   ALLERGIES:  1. PENICILLIN MAKES HER HYPERACTIVE AND NERVOUS.  2. MELLARIL CAUSES HER TO HAVE A RASH AND MAKES HER NERVOUS.   REVIEW OF SYSTEMS:  The patient denies any chest pain, shortness of  breath, headache, vision changes, nausea, vomiting, diarrhea.  Except as  is mentioned in history of present illness, the patient's review of  systems is otherwise negative.   PHYSICAL EXAMINATION:  VITAL SIGNS:  Blood pressure 122/72, pulse 76,  weight 173, height 5 feet 1 inch.  NECK: Supple without masses.  There is no thyromegaly or cervical  adenopathy.  HEART:  Regular rate and rhythm.  There is no murmur.  LUNGS:  Clear.  There are no wheezes, rales or rhonchi.  BACK:  No CVA tenderness.  ABDOMEN:  Without tenderness, guarding, rebound or organomegaly.  EXTREMITIES:  No  clubbing, cyanosis or edema.  PELVIC:  EG/BUS is within normal limits.  Vagina is mildly atrophic.  Cervix is nontender without lesions.  Uterus is completely prolapsed.  Normal size, shape and consistency without tenderness.  Adnexa with no  tenderness or masses.  Rectovaginal without tenderness or masses.   IMPRESSIONS:  1. Complete uterine prolapse.  2. Symptomatic pelvic relaxation.  3. Stress urinary incontinence.   DISPOSITION:  A discussion was held with the patient regarding the  indications for her procedures along with risks which include but are  not limited to reaction to anesthesia, damage to adjacent organs,  infection, excessive bleeding, the possibility of an open abdominal  incision being necessary to complete her surgery safely, the possibility  of erosion of the tension-free vaginal tape and the possibility of  worsening of her symptoms.  The patient verbalized understanding of  these risks and has consented to proceed with surgery.  The patient  further understands that she will have postoperative transient facial  edema, that her hospital stay is expected to be one to two days and that  she may return to her normal activities within two to three weeks.  The  patient has consented to proceed with a robot-  assisted supracervical hysterectomy with sacral colpopexy and placement  of tension-free vaginal tape at [facility] on  January 04, 2007, at 7:30 a.m.  The patient was given a copy of the  MiraLax bowel prep to be administered 24 hours prior to her surgical  procedure.      Katherine [name].      Crist Fat Rivard, M.D.  Electronically Signed    EJP/MEDQ  D:  01/02/2007  T:  01/03/2007  Job:  [id number]

## 2010-06-14 NOTE — Op Note (Signed)
NAMECAMILIA, Katherine [name]             ACCOUNT NO.:  [id number]   MEDICAL RECORD NO.:  [id number]          PATIENT TYPE:  OIB   LOCATION:  1539                         FACILITY:  [facility]   PHYSICIAN:  Crist Fat. Rivard, M.D. DATE OF BIRTH:  April 05, 1945   DATE OF PROCEDURE:  01/04/2007  DATE OF DISCHARGE:                               OPERATIVE REPORT   PREOPERATIVE DIAGNOSIS:  Complete uterine prolapse with pelvic  relaxation and urinary stress incontinence.   POSTOPERATIVE DIAGNOSIS:  Complete uterine prolapse with pelvic  relaxation and urinary stress incontinence.   ANESTHESIA:  General.   ANESTHESIOLOGIST:  Jenelle Mages. Rica Mast, M.D.   PROCEDURE:  Robotic-assisted supracervical hysterectomy with bilateral  salpingo-oophorectomy.   SURGEON:  Crist Fat. Rivard, M.D.   ASSISTANTMarquis Lunch. Adline Peals.   PROCEDURE:  Robotic sacral colpopexy.   SURGEON:  Crist Fat. Rivard, M.D.   ASSISTANT:  Genia Del, M.D.   PROCEDURE:  Transvaginal tape.  That part of the procedure will be  dictated in a separate dictation.   SURGEON:  Osborn Coho, M.D.   ASSISTANT:  Dois Davenport A. Rivard, M.D.   ESTIMATED BLOOD LOSS:  200 mL.   PROCEDURE:  After being informed of the planned procedure with possible  complications including bleeding, infection, injury to bowels, bladder  or ureters, need for laparotomy, expected hospital stay, dependent  facial edema and expected length of procedure, informed consent is  obtained.  The patient is taken to OR #10 and given general anesthesia  with endotracheal intubation without complication..  She is placed in  the lithotomy position, arms padded and tucked on each side, knee-high  sequential compression devices and chest taped to the table.  She is  prepped and draped in a sterile fashion and a Foley catheter is inserted  in her bladder.   GYN exam reveals a complete uterine prolapse and cystocele with minimal  or no posterior wall defect.  A  weighted speculum is inserted.  Anterior  lip of the cervix is grasped with a tenaculum forceps and the cervix was  easily dilated using Hegar dilator until #27.  The uterus was then  sounded at 8.5 cm, which allows easy entry of an 8-mm Rumi intrauterine  manipulator with a 3-mm coring.  The Rumi balloon is inflated with 5 mL  and the coring is sutured to the cervix with 2 simple sutures of 0  Vicryl.  Weighted speculum is removed.  The umbilical area is  infiltrated with Marcaine 0.25 and we perform a semi-elliptical incision  which is brought down sharply to the fascia.  The fascia is identified,  grasped with Kocher forceps and incised with Mayo scissors.  Peritoneum  is entered bluntly.  A pursestring suture of 0 Vicryl is placed on the  fascia and a 10-mm Hasson trocar is easily inserted in the abdominal  cavity and held in place with a pursestring suture; this allowed for  easy insufflation of a pneumoperitoneum using CO2 at a maximum pressure  of 15 mmHg.  The patient is then placed in steep Trendelenburg position,  table lowed to the maximum and the  camera is inserted for a quick  inspection of the pelvic and abdominal cavity.  The uterus is small, but  nonetheless has at least 2 small fibroids measuring between 2 and 3 cm.  Both tubes and ovaries are normal.  Posterior cul-de-sac had a few  adhesions with the omentum and we are unable to visualize more because  of these adhesions.  The appendix is visualized and normal.  The liver  is visualized and normal.  The gallbladder is not visualized.  A  nasogastric tube is in the stomach.  We then decide for our port  placement and in a double-U configuration, we placed two 8-mm robotic  trocars on the left and one 8-mm robotic trocar on the right and one 10-  mm patient-side assistant trocar on the right, all of this under direct  visualization after infiltrating with Marcaine 0.25%.  Using these  trocars and laparoscopic instrument,  we proceed with systematic lysis of  adhesions in the posterior cul-de-sac to free the omentum and be able to  retract the bowel and the omentum in the upper abdomen..  We are now  prepared to dock the robot and this was completed at 9 o'clock, which is  a total of 1 hour of preparation and lysis of adhesions.   Using a monopolar scissor in arm #1, a PK Gyrus forceps in arm #2 and a  fenestrated forceps in arm #3, we prepare to proceed with the  hysterectomy.  Starting on the right side, the ureter is easily  identified away from the infundibulopelvic ligament, which is then  grasped with the PK forceps, cauterized and sectioned with scissors.  The round ligament on the right is cauterized with PK forceps and  sectioned, which gives [place] an easy entry into the broad ligaments.  The  anterior sheath of the broad ligament is incised with scissors all the  way to the bladder flap.  The posterior sheath of the broad ligament is  incised sharply until we have skeletonize the uterine vessels on the  right, identifying them away from the ureter with pressure applied on  the coring.  We then spent a significant amount of time dissecting the  bladder away from the anterior vaginal wall and this was done sharply  and bluntly until we feel we have a long enough area to be able to  proceed with sacral colpopexy.  The uterine vessels can now be cauterize  safely, again with the ureter under direct visualization; this was  performed with a PK forceps.  We then move our attention to the left  side, again identify the ureter away from the infundibulopelvic  ligament, which is then grasped, cauterized with PK forceps and  sectioned with scissors.  The round ligament on the left is cauterized  with PK forceps, sectioned and the anterior sheath of the broad ligament  is incised sharply, as well as the posterior sheath, skeletonizing the  uterine vessels on the left.  We complete the dissection of the  bladder  away from the anterior vaginal wall on that left side in a sharp and  blunt fashion.  With pressure applied on the coring and the ureter far  from our site, we can now safely cauterize the uterine arteries on the  left in its ascending branch with PK forceps.  We then decide on the  length of cervix we are going to leave and we proceed with section of  the cervix using monopolar scissor in a circumferential  way.  The uterus  is grasped and removed completely from the Rumi intrauterine  manipulator.  The balloon is deflated.  The sutures are removed from the  coring and the Rumi intrauterine manipulator is removed completely.  Hemostasis is completed with cautery on the cervical stump and we  proceed with bipolar and monopolar cauterization of the cervical canal.  Elevating the cervical stump and the vagina with an EEA placed in the  vagina, we now dissect the posterior vaginal wall sharply and bluntly  away from its peritoneal surface.  We now have easily isolated 6 cm of  anterior vaginal wall and approximately 5 cm of the posterior vaginal  wall over and above the cervical stump.  This is felt to be sufficient  to proceed with our sacral colpopexy.   We then move towards the upper abdomen to identify the sacral promontory  and with the fenestrated third arm, we retract the bowels completely to  the left; in order to do that, some adhesions need to be lysed on the  left side of the pelvic wall and this is done sharply and bluntly.  We  are now able to mobilize the bowel completely away and identify the  sacral promontory.  Using PK forceps and monopolar scissors, the  peritoneum on top of the sacral promontory is taut and incised.  We now  open the peritoneum along the midline and along the sacrum to develop  this peritoneal canal, where we will be able to cover the mesh.  This is  done sharply with monopolar scissors and PK forceps, staying away from  the ureter.  We make our  way all the way to the posterior cul-de-sac.  We come back to the sacrum, change our scope for the 30-degree down,  where we can now visualize the sacrum in the promontory in a much better  angle to proceed with the dissection of the presacral fat and isolation  of the longitudinal anterior ligament.  This is done systematically  using PK forceps and monopolar scissors.  The middle sacral vessels are  identified away from our site of dissection and we are able to isolate a  large amount of the longitudinal anterior ligament, which should be  sufficient for our sacral colpopexy.  We do encounter mild bleeding in  that dissection, but it is rapidly controlled with bipolar  cauterization.   The 30-degree scope is then replaced by the 0-degree scope and we  trimmed our Y polypropylene mesh to have an anterior sheath of 6 cm, a  posterior sheath of 5 cm, rounded edges and a slim sacral edge.  The  mesh is introduced in the abdominal cavity by the assistant and we place  the vaginal EEA in the anterior vaginal fornix to put it under tension.  The anterior sheath of the mesh is deposited on the anterior wall of the  vagina and we proceed with its anchoring to the vagina, making sure the  suture does not go through the vaginal mucosa.  We anchor the mesh using  8 stitches of CV2 Gore-Tex; this gives [place] a very good anchor of the mesh  on the anterior wall.  The sacral part of the mesh is then rolled up and  the posterior sheath is deposited on the posterior vaginal wall,  displacing the vaginal EEA to the posterior fornix.  This posterior mesh  is then anchored to the posterior vaginal wall using again 6 sutures of  CV2 Gore-Tex, making sure  that the suture does not go through the  vaginal mucosa.  The excess of the posterior mesh is trimmed with the  suture cut instrument and removed with the patient-side assistant  trocar.  We unrolled the sacral part of the mesh and applying vaginal  pressure  and abdominal traction, we determined the placement of this  mesh on the sacrum.  Due to the shortness of the pelvis of the patient,  we decided not to put too much tension on the mesh in order for the  cervix not to rest on the sacrum and so the mesh is trimmed and a  section of 2.5 cm is left for future attachment; the excess is removed  via the patient-side trocar.   Taking great care to retract bowels anteriorly, retract bowels to the  left side and having good visualization of the sacral promontory, we  then changed the scope to a 30-degree scope and we place 2 sutures of  CV2 Gore-Tex in the anterior longitudinal ligament and through-and-  through the mesh.  The sutures are tied with a sliding knot and good  support is noted at this time.  There was no bleeding during the  anchoring to the sacrum.   Using a 2-0 Vicryl, we then proceed with complete  of the mesh until it  is completely covered using the edges we had previously dissected.   We now irrigate profusely with warm saline and note a satisfactory  hemostasis everywhere.  The 10-mm patient-side assistant trocar is then  removed to insert a 15-mm morcellator.  Using a tenaculum forceps, we  grasped the specimen which had been deposited in the left flank and we  morcellated systematically.  We again irrigate profusely with warm  saline and note a satisfactory hemostasis.  We can then proceed with  undocking and removal of all trocars after evacuating the  pneumoperitoneum.  The fascia of the umbilical incision is closed with  the previously placed pursestring suture and the fascia of the right  patient-side trocar incision is closed with a figure-of-eight stitch of  0 Vicryl.  All incisions are then closed with subcuticular suture of 3-0  Monocryl and Dermabond.   [name] legs are elevated and a speculum is inserted in the vagina  to inspect the cervix, which is fine with no bleeding, to also inspect  the vagina,  which is now in a completely normal anatomic position, and  to confirm the absence of through-and-through sutures with the Gore-Tex  material.   Dr. Su Hilt then enters the room and proceeds with TVT, for which she  will dictate her part of the procedure.   At the end, instrument and sponge count is complete x2.  Estimated blood  loss is 200 mL.  The procedure is very well tolerated by the patient,  who is taken to recovery room in a well and stable condition.      Crist Fat Rivard, M.D.  Electronically Signed     SAR/MEDQ  D:  01/04/2007  T:  01/05/2007  Job:  [id number]

## 2010-06-14 NOTE — Op Note (Signed)
Katherine [name], Katherine [name]             ACCOUNT NO.:  [id number]   MEDICAL RECORD NO.:  [id number]          PATIENT TYPE:  OIB   LOCATION:  1539                         FACILITY:  [facility]   PHYSICIAN:  Osborn Coho, M.D.   DATE OF BIRTH:  March 20, 1945   DATE OF PROCEDURE:  01/04/2007  DATE OF DISCHARGE:  01/05/2007                               OPERATIVE REPORT   [name] primary surgery was performed by Dr. Estanislado Pandy and this is  the portion of the surgery that I was consulted to do which consisted of  TVT and cystoscopy.   PREOPERATIVE DIAGNOSIS:  Stress urinary incontinence.   POSTOPERATIVE DIAGNOSIS:  Stress urinary incontinence.   PROCEDURE:  1. TVT.  2. Cystoscopy.   ANESTHESIA:  General.   SURGEON:  Osborn Coho, M.D.   ASSISTANT:  Dois Davenport A. Rivard, M.D.   COMPLICATIONS:  None.   FINDINGS:  Bilateral efflux of ureters.   DESCRIPTION OF PROCEDURE:  The patient was already prepped and draped  after the surgery performed by Dr. Estanislado Pandy.  The patient had not been  shaved so at this time the mons pubis was made and reprepped with  Betadine.  A weighted speculum was placed in the patient's vagina and  the anterior vaginal mucosa injected with dilute Pitressin.  At the  level of the mid urethra for approximately 1 cm, the vaginal mucosa was  incised and the underlying tissue dissected away from the vaginal mucosa  to the lower edge of the symphysis pubis bilaterally. The bladder was  then drained completely and the Foley removed and replaced with the 18-  [place] Foley with catheter guide.  Attention was turned to the mons  pubis where two fingerbreadths away from the midline bilaterally 5-mm  incisions were made at the upper edge of the symphysis pubis.  While  deflecting the catheter guide to the patient's right, the transabdominal  guide was passed through the space of Retzius from the mons pubis out  through the anterior vaginal wall.  The same was done on the  contralateral side.  Cystoscopy was then performed and no inadvertent  bladder injury was noted.  The patient was given indigo carmine and the  bladder drained completely and mesh applied to the transabdominal guides  and elevated through the space of Retzius bilaterally while deflecting  the catheter guide to the ipsilateral side. Cystoscopy was performed  once again and no inadvertent bladder injury was noted and bilateral  ureters were noted to efflux without difficulty.  A large Tresa Endo was then  used between the urethra and mesh in order to keep the mesh slack  beneath the mid urethra and the plastic sheath was removed bilaterally  and cut flush with the skin on the mons pubis bilaterally.  The vaginal  mucosa was repaired with 2-0 Vicryl via a running locked stitch.  The two incisions on the mons pubis were repaired with Dermabond.  The  vagina was packed tightly with 1-inch plain packing soaked with  estrogen.  Sponge, lap and needle count was correct.  The patient  tolerated the procedure well and was awaiting transfer  to the recovery  room in good condition.      Osborn Coho, M.D.  Electronically Signed     AR/MEDQ  D:  01/07/2007  T:  01/07/2007  Job:  [id number]

## 2010-06-17 NOTE — Op Note (Signed)
Katherine [name], ROZAS             ACCOUNT NO.:  [id number]   MEDICAL RECORD NO.:  [id number]          PATIENT TYPE:  AMB   LOCATION:  ENDO                         FACILITY:  [facility]   PHYSICIAN:  Graylin Shiver, M.D.   DATE OF BIRTH:  [birth year]   DATE OF PROCEDURE:  05/09/2004  DATE OF DISCHARGE:                                 OPERATIVE REPORT   PROCEDURE PERFORMED:  Colonoscopy.   INDICATIONS:  Screening.   Informed consent was obtained after explanation of the risks of bleeding,  infection and perforation.   PREMEDICATION:  Fentanyl 35 mcg IV, Versed 4 milligrams IV.   PROCEDURE:  With the patient in the left lateral decubitus position, a  rectal exam was performed. No masses were felt. The Olympus colonoscope was  inserted into the rectum and advanced around the colon to the cecum.  Cecal  landmarks were identified. The cecum and ascending colon were normal. The  transverse colon normal. The descending colon, sigmoid and rectum were  normal.  She tolerated the procedure well without complications.   IMPRESSION:  Normal colonoscopy to the cecum.   I would recommend a follow-up screening colonoscopy again in 10 years.      SFG/MEDQ  D:  05/09/2004  T:  05/09/2004  Job:  [id number]   cc:   Lavonda Jumbo, M.D.  [address] [place], [place] [address]  Fax: [phone]

## 2010-08-01 ENCOUNTER — Other Ambulatory Visit: Payer: Self-pay | Admitting: Internal Medicine

## 2010-08-01 NOTE — Telephone Encounter (Signed)
Rx Done . 

## 2010-08-05 ENCOUNTER — Ambulatory Visit: Payer: Self-pay | Admitting: Internal Medicine

## 2010-08-11 ENCOUNTER — Other Ambulatory Visit: Payer: Self-pay | Admitting: Internal Medicine

## 2010-08-22 ENCOUNTER — Other Ambulatory Visit: Payer: Self-pay | Admitting: Internal Medicine

## 2010-08-24 ENCOUNTER — Other Ambulatory Visit: Payer: Self-pay | Admitting: Internal Medicine

## 2010-08-26 MED ORDER — OMEPRAZOLE 20 MG PO TBEC: 1.0000 | DELAYED_RELEASE_TABLET | Freq: Two times a day (BID) | ORAL | Status: DC

## 2010-09-27 DIAGNOSIS — J309 Allergic rhinitis, unspecified: Secondary | ICD-10-CM

## 2010-10-07 ENCOUNTER — Encounter: Payer: Self-pay | Admitting: Internal Medicine

## 2010-10-07 DIAGNOSIS — Z0001 Encounter for general adult medical examination with abnormal findings: Secondary | ICD-10-CM | POA: Insufficient documentation

## 2010-10-12 ENCOUNTER — Ambulatory Visit (INDEPENDENT_AMBULATORY_CARE_PROVIDER_SITE_OTHER): Payer: 59 | Admitting: Internal Medicine

## 2010-10-12 ENCOUNTER — Other Ambulatory Visit (INDEPENDENT_AMBULATORY_CARE_PROVIDER_SITE_OTHER): Payer: 59

## 2010-10-12 ENCOUNTER — Encounter: Payer: Self-pay | Admitting: Internal Medicine

## 2010-10-12 VITALS — BP 120/80 | HR 77 | Temp 97.9°F | Ht 59.0 in | Wt 142.8 lb

## 2010-10-12 DIAGNOSIS — Z79899 Other long term (current) drug therapy: Secondary | ICD-10-CM

## 2010-10-12 DIAGNOSIS — Z0000 Encounter for general adult medical examination without abnormal findings: Secondary | ICD-10-CM

## 2010-10-12 DIAGNOSIS — E119 Type 2 diabetes mellitus without complications: Secondary | ICD-10-CM

## 2010-10-12 DIAGNOSIS — Z23 Encounter for immunization: Secondary | ICD-10-CM

## 2010-10-12 LAB — HEMOGLOBIN A1C: Hgb A1c MFr Bld: 5.5 % (ref 4.6–6.5)

## 2010-10-12 LAB — HEPATIC FUNCTION PANEL
AST: 23 U/L (ref 0–37)
Albumin: 4.1 g/dL (ref 3.5–5.2)
Alkaline Phosphatase: 99 U/L (ref 39–117)
Total Protein: 6.6 g/dL (ref 6.0–8.3)

## 2010-10-12 LAB — LIPID PANEL
Cholesterol: 142 mg/dL (ref 0–200)
HDL: 53.3 mg/dL (ref >39.00)
VLDL: 26.8 mg/dL (ref 0.0–40.0)

## 2010-10-12 LAB — CBC WITH DIFFERENTIAL/PLATELET
Basophils Absolute: 0 10*3/uL (ref 0.0–0.1)
Eosinophils Relative: 2.1 % (ref 0.0–5.0)
HCT: 36.9 % (ref 36.0–46.0)
Lymphocytes Relative: 31 % (ref 12.0–46.0)
Monocytes Relative: 8.7 % (ref 3.0–12.0)
Neutrophils Relative %: 57.7 % (ref 43.0–77.0)
Platelets: 308 10*3/uL (ref 150.0–400.0)
RDW: 14 % (ref 11.5–14.6)
WBC: 5.7 10*3/uL (ref 4.5–10.5)

## 2010-10-12 LAB — BASIC METABOLIC PANEL
BUN: 15 mg/dL (ref 6–23)
CO2: 28 mEq/L (ref 19–32)
GFR: 144.85 mL/min (ref >60.00)
Glucose, Bld: 107 mg/dL — ABNORMAL HIGH (ref 70–99)
Potassium: 3.8 mEq/L (ref 3.5–5.1)
Sodium: 138 mEq/L (ref 135–145)

## 2010-10-12 LAB — URINALYSIS, ROUTINE W REFLEX MICROSCOPIC
Ketones, ur: NEGATIVE
Nitrite: NEGATIVE
Specific Gravity, Urine: 1.02 (ref 1.000–1.030)
Urobilinogen, UA: 0.2 (ref 0.0–1.0)
pH: 6 (ref 5.0–8.0)

## 2010-10-12 LAB — MICROALBUMIN / CREATININE URINE RATIO: Microalb, Ur: 2.4 mg/dL — ABNORMAL HIGH (ref 0.0–1.9)

## 2010-10-12 LAB — TSH: TSH: 0.79 u[IU]/mL (ref 0.35–5.50)

## 2010-10-12 MED ORDER — LANCETS MISC: Status: DC

## 2010-10-12 MED ORDER — OMEPRAZOLE 20 MG PO TBEC: 1.0000 | DELAYED_RELEASE_TABLET | Freq: Two times a day (BID) | ORAL | Status: DC

## 2010-10-12 MED ORDER — FEXOFENADINE HCL 180 MG PO TABS: 180.0000 mg | ORAL_TABLET | Freq: Every day | ORAL | Status: DC

## 2010-10-12 MED ORDER — GLUCOSE BLOOD VI STRP: ORAL_STRIP | Status: DC

## 2010-10-12 MED ORDER — ALBUTEROL SULFATE HFA 108 (90 BASE) MCG/ACT IN AERS: 2.0000 | INHALATION_SPRAY | Freq: Four times a day (QID) | RESPIRATORY_TRACT | Status: DC | PRN

## 2010-10-12 MED ORDER — AMLODIPINE BESYLATE 5 MG PO TABS: 5.0000 mg | ORAL_TABLET | Freq: Every day | ORAL | Status: DC

## 2010-10-12 MED ORDER — PREGABALIN 100 MG PO CAPS: 100.0000 mg | ORAL_CAPSULE | Freq: Three times a day (TID) | ORAL | Status: DC

## 2010-10-12 NOTE — Patient Instructions (Addendum)
You had the flu shot today Continue all other medications as before Your refills were sent to the pharmacy as you requested, including the Allegra for the allergies, and the strips and lancets Please go to LAB in the Basement for the blood and/or urine tests to be done today Please call the phone number [phone] (the PhoneTree System) for results of testing in 2-3 days;  When calling, simply dial the number, and when prompted enter the MRN number above (the Medical Record Number) and the # key, then the message should start. Please return in 6 mo with Lab testing done 3-5 days before

## 2010-10-13 ENCOUNTER — Telehealth: Payer: Self-pay

## 2010-10-13 ENCOUNTER — Other Ambulatory Visit: Payer: Self-pay | Admitting: Internal Medicine

## 2010-10-13 ENCOUNTER — Encounter: Payer: Self-pay | Admitting: Internal Medicine

## 2010-10-13 MED ORDER — CIPROFLOXACIN HCL 500 MG PO TABS: 500.0000 mg | ORAL_TABLET | Freq: Two times a day (BID) | ORAL | Status: AC

## 2010-10-13 NOTE — Telephone Encounter (Signed)
Message copied by Pincus Sanes on Thu Oct 13, 2010  9:45 AM ------      Message from: Corwin Levins      Created: Thu Oct 13, 2010  9:30 AM       Left message on phone tree  - study negative, normal or stable except for abnormal UA c/w prob uti -                 1)  For cipro course            Donette Mainwaring to inform pt, I will do rx

## 2010-10-13 NOTE — Telephone Encounter (Signed)
Patient informed of UTI and she agreed to pickup antibiotic

## 2010-10-13 NOTE — Telephone Encounter (Signed)
Called the patient left message to call back 

## 2010-10-13 NOTE — Assessment & Plan Note (Signed)
stable overall by hx and exam, most recent data reviewed with pt, and pt to continue medical treatment as before  Lab Results  Component Value Date   HGBA1C 5.5 10/12/2010

## 2010-10-13 NOTE — Progress Notes (Signed)
Subjective:    Patient ID: Katherine [name], female    DOB: 02/21/1945, 65 y.o.   MRN: [id number]  HPI  Here for wellness and f/u;  Overall doing ok;  Pt denies CP, worsening SOB, DOE, wheezing, orthopnea, PND, worsening LE edema, palpitations, dizziness or syncope.  Pt denies neurological change such as new Headache, facial or extremity weakness.  Pt denies polydipsia, polyuria, or low sugar symptoms. Pt states overall good compliance with treatment and medications, good tolerability, and trying to follow lower cholesterol diet.  Pt denies worsening depressive symptoms, suicidal ideation or panic. No fever, wt loss, night sweats, loss of appetite, or other constitutional symptoms.  Pt states good ability with ADL's, low fall risk, home safety reviewed and adequate, no significant changes in hearing or vision, and occasionally active with exercise.  Needs several med refills.  No other new complaints Past Medical History  Diagnosis Date  . ABDOMINAL PAIN, LOWER 02/04/2010  . ABNORMAL THYROID FUNCTION TESTS 08/04/2009  . ALLERGIC RHINITIS 09/05/2006  . ANXIETY 07/31/2007  . DEPRESSION 09/05/2006  . DIABETES MELLITUS, TYPE II 07/31/2007  . DIVERTICULOSIS, COLON 09/05/2006  . Dysuria 04/15/2010  . Failure to thrive 04/06/2010  . FATIGUE 04/15/2010  . FIBROMYALGIA 05/10/2009  . FRACTURE, PELVIS, RIGHT 04/15/2010  . GASTROENTERITIS, ACUTE 12/25/2008  . GERD 09/05/2006  . HYPERLIPIDEMIA 09/05/2006  . HYPERTENSION 09/05/2006  . KNEE PAIN, LEFT 02/04/2010  . OSTEOARTHRITIS 09/05/2006  . OSTEOPOROSIS 07/31/2007  . Other specified forms of hearing loss 11/01/2009  . SINUSITIS- ACUTE-NOS 10/09/2007  . SKIN LESION 05/10/2009  . SPRAIN&STRAIN OTH SPEC SITES SHOULDER&UPPER ARM 11/01/2009  . Unspecified hearing loss 10/09/2007  . URI 02/20/2007  . UTI 02/20/2007   Past Surgical History  Procedure Date  . Tonsillectomy   . Breast biopsy   . Abdominal hysterectomy 12/08    Bladder tac  . Inguinal herniorrhapy     right Dr. Freida Busman  06/2009    reports that she has never smoked. She does not have any smokeless tobacco history on file. She reports that she does not drink alcohol or use illicit drugs. family history includes Coronary artery disease in her other and Diabetes in her other. Allergies  Allergen Reactions  . Penicillins   . Thioridazine Hcl    Current Outpatient Prescriptions on File Prior to Visit  Medication Sig Dispense Refill  . alendronate (FOSAMAX) 35 MG tablet Take 35 mg by mouth every 7 (seven) days. Take with a full glass of water on an empty stomach.       [name] aspirin 81 MG tablet Take 81 mg by mouth daily.        [name] azelastine (ASTELIN) 137 MCG/SPRAY nasal spray USE 2 SPRAYS IN EACH NOSTRIL ONCE DAILY AS NEEDED  30 mL  5  . benztropine (COGENTIN) 0.5 MG tablet Take 0.5 mg by mouth 2 (two) times daily.        [name] doxazosin (CARDURA) 8 MG tablet TAKE ONE-HALF TABLET BY MOUTH ONCE DAILY  30 tablet  11  . Electronic Thermometer (DIGITAL THERMOMETER) MISC Use as directed       . ipratropium (ATROVENT HFA) 17 MCG/ACT inhaler Inhale 1 puff into the lungs 3 (three) times daily.        [name] ipratropium (ATROVENT) 0.06 % nasal spray SPRAY 1-2 SPRAYS IN EACH NOSTRIL BEFORE MEALS  15 mL  5  . methocarbamol (ROBAXIN) 500 MG tablet 1-2 by mouth every 6 hours for muscle spasms       .  mometasone (NASONEX) 50 MCG/ACT nasal spray Place 2 sprays into the nose daily.        . promethazine (PHENERGAN) 12.5 MG tablet Take 12.5 mg by mouth every 6 (six) hours as needed.        . risperiDONE (RISPERDAL) 3 MG tablet Take 3 mg by mouth daily.        . simvastatin (ZOCOR) 40 MG tablet TAKE ONE TABLET BY MOUTH ONCE DAILY  30 tablet  11  . traMADol (ULTRAM-ER) 200 MG 24 hr tablet Take 200 mg by mouth daily as needed.         Review of Systems Review of Systems  Constitutional: Negative for diaphoresis, activity change, appetite change and unexpected weight change.  HENT: Negative for hearing loss, ear pain, facial swelling, mouth  sores and neck stiffness.   Eyes: Negative for pain, redness and visual disturbance.  Respiratory: Negative for shortness of breath and wheezing.   Cardiovascular: Negative for chest pain and palpitations.  Gastrointestinal: Negative for diarrhea, blood in stool, abdominal distention and rectal pain.  Genitourinary: Negative for hematuria, flank pain and decreased urine volume.  Musculoskeletal: Negative for myalgias and joint swelling.  Skin: Negative for color change and wound.  Neurological: Negative for syncope and numbness.  Hematological: Negative for adenopathy.  Psychiatric/Behavioral: Negative for hallucinations, self-injury, decreased concentration and agitation.      Objective:   Physical Exam BP 120/80  Pulse 77  Temp(Src) 97.9 F (36.6 C) (Oral)  Ht 4\' 11"  (1.499 m)  Wt 142 lb 12 oz (64.751 kg)  BMI 28.83 kg/m2  SpO2 91% Physical Exam  VS noted Constitutional: Pt is oriented to person, place, and time. Appears well-developed and well-nourished.  Head: Normocephalic and atraumatic.  Right Ear: External ear normal.  Left Ear: External ear normal.  Nose: Nose normal.  Mouth/Throat: Oropharynx is clear and moist.  Eyes: Conjunctivae and EOM are normal. Pupils are equal, round, and reactive to light.  Neck: Normal range of motion. Neck supple. No JVD present. No tracheal deviation present.  Cardiovascular: Normal rate, regular rhythm, normal heart sounds and intact distal pulses.   Pulmonary/Chest: Effort normal and breath sounds normal.  Abdominal: Soft. Bowel sounds are normal. There is no tenderness.  Musculoskeletal: Normal range of motion. Exhibits no edema.  Lymphadenopathy:  Has no cervical adenopathy.  Neurological: Pt is alert and oriented to person, place, and time. Pt has normal reflexes. No cranial nerve deficit.  Skin: Skin is warm and dry. No rash noted.  Psychiatric:  Has  normal mood and affect.except 1-2+ nervous         Assessment & Plan:

## 2010-10-13 NOTE — Assessment & Plan Note (Signed)

## 2010-10-26 LAB — HM MAMMOGRAPHY: HM Mammogram: NEGATIVE

## 2010-11-04 ENCOUNTER — Telehealth: Payer: Self-pay | Admitting: *Deleted

## 2010-11-07 LAB — COMPREHENSIVE METABOLIC PANEL
Alkaline Phosphatase: 67
BUN: 12
CO2: 28
Chloride: 100
GFR calc non Af Amer: >60
Glucose, Bld: 105 — ABNORMAL HIGH
Potassium: 4.3
Total Bilirubin: 1

## 2010-11-07 LAB — CBC
HCT: 38
Hemoglobin: 13.1
MCHC: 35.1
RBC: 3.38 — ABNORMAL LOW
WBC: 11.4 — ABNORMAL HIGH
WBC: 6.2

## 2010-11-07 LAB — URINALYSIS, ROUTINE W REFLEX MICROSCOPIC
Bilirubin Urine: NEGATIVE
Glucose, UA: NEGATIVE
Hgb urine dipstick: NEGATIVE
Nitrite: NEGATIVE
Protein, ur: NEGATIVE
Urobilinogen, UA: 0.2

## 2010-11-07 NOTE — Telephone Encounter (Signed)
A user error has taken place: Wrong patient

## 2011-01-03 ENCOUNTER — Other Ambulatory Visit: Payer: Self-pay

## 2011-01-03 MED ORDER — MOMETASONE FUROATE 50 MCG/ACT NA SUSP: 2.0000 | Freq: Every day | NASAL | Status: DC

## 2011-02-20 ENCOUNTER — Other Ambulatory Visit: Payer: Self-pay

## 2011-02-20 MED ORDER — ALENDRONATE SODIUM 35 MG PO TABS: 35.0000 mg | ORAL_TABLET | ORAL | Status: DC

## 2011-02-20 MED ORDER — OMEPRAZOLE 20 MG PO TBEC: 1.0000 | DELAYED_RELEASE_TABLET | Freq: Two times a day (BID) | ORAL | Status: DC

## 2011-03-02 ENCOUNTER — Other Ambulatory Visit: Payer: Self-pay | Admitting: Internal Medicine

## 2011-03-02 MED ORDER — OMEPRAZOLE 20 MG PO TBEC: 1.0000 | DELAYED_RELEASE_TABLET | Freq: Two times a day (BID) | ORAL | Status: DC

## 2011-03-02 MED ORDER — ALENDRONATE SODIUM 35 MG PO TABS: 35.0000 mg | ORAL_TABLET | ORAL | Status: DC

## 2011-03-02 NOTE — Telephone Encounter (Signed)
Requesting a refill of Omeprazole 20mg  and Alendronate 35mg  [facility], phone -[phone], fax - [phone]

## 2011-03-14 ENCOUNTER — Other Ambulatory Visit: Payer: Self-pay

## 2011-03-14 MED ORDER — AZELASTINE HCL 0.1 % NA SOLN: 2.0000 | Freq: Every day | NASAL | Status: DC | PRN

## 2011-03-14 MED ORDER — IPRATROPIUM BROMIDE 0.06 % NA SOLN: 1.0000 | Freq: Three times a day (TID) | NASAL | Status: DC

## 2011-03-14 MED ORDER — FEXOFENADINE HCL 180 MG PO TABS: 180.0000 mg | ORAL_TABLET | Freq: Every day | ORAL | Status: DC

## 2011-03-15 ENCOUNTER — Telehealth: Payer: Self-pay

## 2011-03-15 MED ORDER — CETIRIZINE HCL 10 MG PO TABS: 10.0000 mg | ORAL_TABLET | Freq: Every day | ORAL | Status: DC

## 2011-03-15 NOTE — Telephone Encounter (Signed)
A user error has taken place: encounter opened in error, closed for administrative reasons.

## 2011-03-23 ENCOUNTER — Encounter: Payer: Self-pay | Admitting: Gastroenterology

## 2011-04-10 ENCOUNTER — Encounter: Payer: Self-pay | Admitting: Gastroenterology

## 2011-04-12 ENCOUNTER — Ambulatory Visit: Payer: 59 | Admitting: Internal Medicine

## 2011-04-19 ENCOUNTER — Other Ambulatory Visit (INDEPENDENT_AMBULATORY_CARE_PROVIDER_SITE_OTHER): Payer: 59

## 2011-04-19 ENCOUNTER — Ambulatory Visit (INDEPENDENT_AMBULATORY_CARE_PROVIDER_SITE_OTHER): Payer: 59 | Admitting: Internal Medicine

## 2011-04-19 ENCOUNTER — Encounter: Payer: Self-pay | Admitting: Internal Medicine

## 2011-04-19 ENCOUNTER — Other Ambulatory Visit: Payer: Self-pay

## 2011-04-19 VITALS — BP 110/70 | HR 64 | Temp 98.6°F | Ht 62.0 in | Wt 148.2 lb

## 2011-04-19 DIAGNOSIS — Z0000 Encounter for general adult medical examination without abnormal findings: Secondary | ICD-10-CM

## 2011-04-19 DIAGNOSIS — E119 Type 2 diabetes mellitus without complications: Secondary | ICD-10-CM

## 2011-04-19 DIAGNOSIS — M25569 Pain in unspecified knee: Secondary | ICD-10-CM

## 2011-04-19 DIAGNOSIS — I10 Essential (primary) hypertension: Secondary | ICD-10-CM

## 2011-04-19 DIAGNOSIS — E785 Hyperlipidemia, unspecified: Secondary | ICD-10-CM

## 2011-04-19 LAB — BASIC METABOLIC PANEL
BUN: 14 mg/dL (ref 6–23)
GFR: 108.51 mL/min (ref >60.00)
Glucose, Bld: 97 mg/dL (ref 70–99)
Potassium: 4.3 mEq/L (ref 3.5–5.1)

## 2011-04-19 LAB — LIPID PANEL
Cholesterol: 158 mg/dL (ref 0–200)
HDL: 56.6 mg/dL (ref >39.00)
VLDL: 28 mg/dL (ref 0.0–40.0)

## 2011-04-19 LAB — HEMOGLOBIN A1C: Hgb A1c MFr Bld: 5.5 % (ref 4.6–6.5)

## 2011-04-19 MED ORDER — LANCETS MISC: Status: DC

## 2011-04-19 MED ORDER — TRAMADOL HCL ER 200 MG PO TB24: 200.0000 mg | ORAL_TABLET | Freq: Every day | ORAL | Status: DC | PRN

## 2011-04-19 MED ORDER — GLUCOSE BLOOD VI STRP: ORAL_STRIP | Status: DC

## 2011-04-19 NOTE — Patient Instructions (Addendum)
Your tramadol was refilled today Continue all other medications as before Please have the pharmacy call with any refills you may need. Please go to LAB in the Basement for the blood and/or urine tests to be done today You will be contacted by phone if any changes need to be made immediately.  Otherwise, you will receive a letter about your results with an explanation. You will be contacted regarding the referral for: colonoscopy Please return in 6 mo with Lab testing done 3-5 days before

## 2011-04-20 ENCOUNTER — Encounter: Payer: Self-pay | Admitting: Gastroenterology

## 2011-04-23 ENCOUNTER — Encounter: Payer: Self-pay | Admitting: Internal Medicine

## 2011-04-23 NOTE — Assessment & Plan Note (Signed)
stable overall by hx and exam, , and pt to continue medical treatment as before, for refill tramadol today

## 2011-04-23 NOTE — Assessment & Plan Note (Signed)
stable overall by hx and exam, most recent data reviewed with pt, and pt to continue medical treatment as before  BP Readings from Last 3 Encounters:  04/19/11 110/70  10/12/10 120/80  04/15/10 136/80

## 2011-04-23 NOTE — Assessment & Plan Note (Signed)
stable overall by hx and exam, most recent data reviewed with pt, and pt to continue medical treatment as before  Lab Results  Component Value Date   LDLCALC 73 04/19/2011

## 2011-04-23 NOTE — Progress Notes (Signed)
Subjective:    Patient ID: Katherine [name], female    DOB: [birth year], 66 y.o.   MRN: [id number]  HPI  Here to f/u; overall doing ok,  Pt denies chest pain, increased sob or doe, wheezing, orthopnea, PND, increased LE swelling, palpitations, dizziness or syncope.  Pt denies new neurological symptoms such as new headache, or facial or extremity weakness or numbness   Pt denies polydipsia, polyuria, or low sugar symptoms such as weakness or confusion improved with po intake.  Pt states overall good compliance with meds, trying to follow lower cholesterol, diabetic diet, wt overall stable but little exercise however.  Does have ongoing chronic knee pain, needs better pain med.  Also due for colonscopy f/u.  FMS symptoms stable.  Denies worsening depressive symptoms, suicidal ideation, or panic, though has ongoing anxiety, not increased recently.  Past Medical History  Diagnosis Date  . ABDOMINAL PAIN, LOWER 02/04/2010  . ABNORMAL THYROID FUNCTION TESTS 08/04/2009  . ALLERGIC RHINITIS 09/05/2006  . ANXIETY 07/31/2007  . DEPRESSION 09/05/2006  . DIABETES MELLITUS, TYPE II 07/31/2007  . DIVERTICULOSIS, COLON 09/05/2006  . Dysuria 04/15/2010  . Failure to thrive 04/06/2010  . FATIGUE 04/15/2010  . FIBROMYALGIA 05/10/2009  . FRACTURE, PELVIS, RIGHT 04/15/2010  . GASTROENTERITIS, ACUTE 12/25/2008  . GERD 09/05/2006  . HYPERLIPIDEMIA 09/05/2006  . HYPERTENSION 09/05/2006  . KNEE PAIN, LEFT 02/04/2010  . OSTEOARTHRITIS 09/05/2006  . OSTEOPOROSIS 07/31/2007  . Other specified forms of hearing loss 11/01/2009  . SINUSITIS- ACUTE-NOS 10/09/2007  . SKIN LESION 05/10/2009  . SPRAIN&STRAIN OTH SPEC SITES SHOULDER&UPPER ARM 11/01/2009  . Unspecified hearing loss 10/09/2007  . URI 02/20/2007  . UTI 02/20/2007   Past Surgical History  Procedure Date  . Tonsillectomy   . Breast biopsy   . Abdominal hysterectomy 12/08    Bladder tac  . Inguinal herniorrhapy     right Dr. Freida Busman 06/2009    reports that she has never smoked. She does not  have any smokeless tobacco history on file. She reports that she does not drink alcohol or use illicit drugs. family history includes Coronary artery disease in her other and Diabetes in her other. Allergies  Allergen Reactions  . Penicillins   . Thioridazine Hcl    Current Outpatient Prescriptions on File Prior to Visit  Medication Sig Dispense Refill  . albuterol (PROVENTIL HFA;VENTOLIN HFA) 108 (90 BASE) MCG/ACT inhaler Inhale 2 puffs into the lungs every 6 (six) hours as needed.  1 Inhaler  5  . alendronate (FOSAMAX) 35 MG tablet Take 1 tablet (35 mg total) by mouth every 7 (seven) days. Take with a full glass of water on an empty stomach.  4 tablet  10  . amLODipine (NORVASC) 5 MG tablet Take 1 tablet (5 mg total) by mouth daily.  90 tablet  3  . aspirin 81 MG tablet Take 81 mg by mouth daily.        [name] azelastine (ASTELIN) 137 MCG/SPRAY nasal spray Place 2 sprays into the nose daily as needed for rhinitis. Use in each nostril as directed  30 mL  5  . benztropine (COGENTIN) 0.5 MG tablet Take 0.5 mg by mouth 2 (two) times daily.        . cetirizine (ZYRTEC) 10 MG tablet Take 1 tablet (10 mg total) by mouth daily.  30 tablet  11  . doxazosin (CARDURA) 8 MG tablet TAKE ONE-HALF TABLET BY MOUTH ONCE DAILY  30 tablet  11  . Electronic Thermometer (DIGITAL THERMOMETER) MISC  Use as directed       . fexofenadine (ALLEGRA) 180 MG tablet Take 1 tablet (180 mg total) by mouth daily.  30 tablet  5  . ipratropium (ATROVENT HFA) 17 MCG/ACT inhaler Inhale 1 puff into the lungs 3 (three) times daily.        [name] ipratropium (ATROVENT) 0.06 % nasal spray Place 1 spray into the nose 3 (three) times daily.  15 mL  5  . methocarbamol (ROBAXIN) 500 MG tablet 1-2 by mouth every 6 hours for muscle spasms       . mometasone (NASONEX) 50 MCG/ACT nasal spray Place 2 sprays into the nose daily.  17 g  10  . Omeprazole 20 MG TBEC Take 1 tablet (20 mg total) by mouth 2 (two) times daily.  60 each  10  . pregabalin  (LYRICA) 100 MG capsule Take 1 capsule (100 mg total) by mouth 3 (three) times daily.  180 capsule  3  . promethazine (PHENERGAN) 12.5 MG tablet Take 12.5 mg by mouth every 6 (six) hours as needed.        . risperiDONE (RISPERDAL) 3 MG tablet Take 3 mg by mouth daily.        . simvastatin (ZOCOR) 40 MG tablet TAKE ONE TABLET BY MOUTH ONCE DAILY  30 tablet  11   Review of Systems Review of Systems  Constitutional: Negative for diaphoresis and unexpected weight change.  HENT: Negative for drooling and tinnitus.   Eyes: Negative for photophobia and visual disturbance.  Respiratory: Negative for choking and stridor.   Gastrointestinal: Negative for vomiting and blood in stool.  Genitourinary: Negative for hematuria and decreased urine volume.  Musculoskeletal: Negative for gait problem.    Objective:   Physical Exam BP 110/70  Pulse 64  Temp(Src) 98.6 F (37 C) (Oral)  Ht 5\' 2"  (1.575 m)  Wt 148 lb 4 oz (67.246 kg)  BMI 27.12 kg/m2  SpO2 97% Physical Exam  VS noted Constitutional: Pt appears well-developed and well-nourished. Katherine [name] HENT: Head: Normocephalic.  Right Ear: External ear normal.  Left Ear: External ear normal.  Eyes: Conjunctivae and EOM are normal. Pupils are equal, round, and reactive to light.  Neck: Normal range of motion. Neck supple.  Cardiovascular: Normal rate and regular rhythm.   Pulmonary/Chest: Effort normal and breath sounds normal.  Abd:  Soft, NT, non-distended, + BS Neurological: Pt is alert. No cranial nerve deficit.  Skin: Skin is warm. No erythema.  Psychiatric: Pt behavior is normal. Thought content normal.     Assessment & Plan:

## 2011-04-23 NOTE — Assessment & Plan Note (Signed)
stable overall by hx and exam, most recent data reviewed with pt, and pt to continue medical treatment as before  Lab Results  Component Value Date   HGBA1C 5.5 04/19/2011

## 2011-05-17 ENCOUNTER — Ambulatory Visit (AMBULATORY_SURGERY_CENTER): Payer: 59 | Admitting: *Deleted

## 2011-05-17 VITALS — Ht 59.0 in | Wt 143.7 lb

## 2011-05-17 DIAGNOSIS — Z1211 Encounter for screening for malignant neoplasm of colon: Secondary | ICD-10-CM

## 2011-05-17 MED ORDER — MOVIPREP 100 G PO SOLR: ORAL | Status: DC

## 2011-05-26 ENCOUNTER — Other Ambulatory Visit: Payer: Self-pay

## 2011-05-26 MED ORDER — DOXAZOSIN MESYLATE 8 MG PO TABS: 8.0000 mg | ORAL_TABLET | Freq: Every day | ORAL | Status: DC

## 2011-05-30 ENCOUNTER — Telehealth: Payer: Self-pay | Admitting: *Deleted

## 2011-05-30 MED ORDER — GLUCOSE BLOOD VI STRP: ORAL_STRIP | Status: DC

## 2011-05-30 MED ORDER — ONETOUCH LANCETS MISC: Status: DC

## 2011-05-30 NOTE — Telephone Encounter (Signed)
Left msg on vm needing strips & lancets sent to [facility]. Called pt back verified which meter she uses one touch ultra 2. Inform pt will send to [facility]... 05/30/11@10 :30am/LMB

## 2011-05-31 ENCOUNTER — Other Ambulatory Visit: Payer: 59 | Admitting: Gastroenterology

## 2011-06-02 ENCOUNTER — Other Ambulatory Visit: Payer: Self-pay

## 2011-06-02 MED ORDER — TRAMADOL HCL ER 200 MG PO TB24: 200.0000 mg | ORAL_TABLET | Freq: Every day | ORAL | Status: DC | PRN

## 2011-06-02 NOTE — Telephone Encounter (Signed)
Done escript 

## 2011-06-09 ENCOUNTER — Telehealth: Payer: Self-pay | Admitting: Gastroenterology

## 2011-06-09 NOTE — Telephone Encounter (Signed)
Spoke with patient and clarified prep instructions. Pt. Understands and no further questions at this time.

## 2011-06-13 ENCOUNTER — Ambulatory Visit (AMBULATORY_SURGERY_CENTER): Payer: 59 | Admitting: Gastroenterology

## 2011-06-13 ENCOUNTER — Encounter: Payer: Self-pay | Admitting: Gastroenterology

## 2011-06-13 VITALS — BP 120/75 | HR 80 | Temp 95.8°F | Resp 18 | Ht 59.0 in | Wt 143.0 lb

## 2011-06-13 DIAGNOSIS — Z1211 Encounter for screening for malignant neoplasm of colon: Secondary | ICD-10-CM

## 2011-06-13 MED ORDER — SODIUM CHLORIDE 0.9 % IV SOLN: 500.0000 mL | INTRAVENOUS | Status: DC

## 2011-06-13 NOTE — Progress Notes (Signed)
Pt had 2 day prep

## 2011-06-13 NOTE — Patient Instructions (Signed)
YOU HAD AN ENDOSCOPIC PROCEDURE TODAY AT THE [facility]: Refer to the procedure report that was given to you for any specific questions about what was found during the examination.  If the procedure report does not answer your questions, please call your gastroenterologist to clarify.  If you requested that your care partner not be given the details of your procedure findings, then the procedure report has been included in a sealed envelope for you to review at your convenience later.  YOU SHOULD EXPECT: Some feelings of bloating in the abdomen. Passage of more gas than usual.  Walking can help get rid of the air that was put into your GI tract during the procedure and reduce the bloating. If you had a lower endoscopy (such as a colonoscopy or flexible sigmoidoscopy) you may notice spotting of blood in your stool or on the toilet paper. If you underwent a bowel prep for your procedure, then you may not have a normal bowel movement for a few days.  DIET: Your first meal following the procedure should be a light meal and then it is ok to progress to your normal diet.  A half-sandwich or bowl of soup is an example of a good first meal.  Heavy or fried foods are harder to digest and may make you feel nauseous or bloated.  Likewise meals heavy in dairy and vegetables can cause extra gas to form and this can also increase the bloating.  Drink plenty of fluids but you should avoid alcoholic beverages for 24 hours.  ACTIVITY: Your care partner should take you home directly after the procedure.  You should plan to take it easy, moving slowly for the rest of the day.  You can resume normal activity the day after the procedure however you should NOT DRIVE or use heavy machinery for 24 hours (because of the sedation medicines used during the test).    SYMPTOMS TO REPORT IMMEDIATELY: A gastroenterologist can be reached at any hour.  During normal business hours, 8:30 AM to 5:00 PM Monday through Friday,  call [phone].  After hours and on weekends, please call the GI answering service at [phone] who will take a message and have the physician on call contact you.   Following lower endoscopy (colonoscopy or flexible sigmoidoscopy):  Excessive amounts of blood in the stool  Significant tenderness or worsening of abdominal pains  Swelling of the abdomen that is new, acute  Fever of 100F or higher    FOLLOW UP: If any biopsies were taken you will be contacted by phone or by letter within the next 1-3 weeks.  Call your gastroenterologist if you have not heard about the biopsies in 3 weeks.  Our staff will call the home number listed on your records the next business day following your procedure to check on you and address any questions or concerns that you may have at that time regarding the information given to you following your procedure. This is a courtesy call and so if there is no answer at the home number and we have not heard from you through the emergency physician on call, we will assume that you have returned to your regular daily activities without incident.  SIGNATURES/CONFIDENTIALITY: You and/or your care partner have signed paperwork which will be entered into your electronic medical record.  These signatures attest to the fact that that the information above on your After Visit Summary has been reviewed and is understood.  Full responsibility of the confidentiality   of this discharge information lies with you and/or your care-partner.     

## 2011-06-13 NOTE — Op Note (Signed)
[facility] [address] [place], [place]  [address]  COLONOSCOPY PROCEDURE REPORT  PATIENT:  Katherine [name], Katherine [name]  MR#:  [id number] BIRTHDATE:  1945/06/28, 65 yrs. old  GENDER:  female ENDOSCOPIST:  Judie Petit T. Russella Dar, MD, [facility] ([facility]  PROCEDURE DATE:  06/13/2011 PROCEDURE:  Colonoscopy 19147 ASA CLASS:  Class II INDICATIONS:  1) Routine Risk Screening MEDICATIONS:   MAC sedation, administered by CRNA, propofol (Diprivan) 90 mg IV DESCRIPTION OF PROCEDURE:   After the risks benefits and alternatives of the procedure were thoroughly explained, informed consent was obtained.  Digital rectal exam was performed and revealed no abnormalities.   The LB [id number] endoscope was introduced through the anus and advanced to the cecum, which was identified by both the appendix and ileocecal valve, without limitations.  The quality of the prep was adequate, using MoviPrep.  The instrument was then slowly withdrawn as the colon was fully examined. <<PROCEDUREIMAGES>> FINDINGS:  Mild simgoid colon diverticulosis was noted. The rest iof the colon appeared normal. Retroflexed views in the rectum revealed no abnormalities.  The time to cecum =  2.75  minutes. The scope was then withdrawn (time =  9.25  min) from the patient and the procedure completed.  COMPLICATIONS:  None  ENDOSCOPIC IMPRESSION: 1) Mild sigmoid diverticulosis 2) Otherwise normal colonoscopy  RECOMMENDATIONS: 1) Continue current colorectal screening for "routine risk" patients with a repeat colonoscopy in 10 years. 2) High fiber diet with liberal water intake  Tanishi Nault T. Russella Dar, MD, [facility]  n. eSIGNED:   Venita Lick. Abdulahi Schor at 06/13/2011 02:48 PM  Devita, Rinaldo Cloud, [phone]

## 2011-06-13 NOTE — Progress Notes (Signed)
Patient did not have preoperative order for IV antibiotic SSI prophylaxis. (G8918)  Patient did not experience any of the following events: a burn prior to discharge; a fall within the facility; wrong site/side/patient/procedure/implant event; or a hospital transfer or hospital admission upon discharge from the facility. (G8907)  

## 2011-06-14 ENCOUNTER — Telehealth: Payer: Self-pay | Admitting: *Deleted

## 2011-06-14 NOTE — Telephone Encounter (Signed)
  Follow up Call-  Call back number 06/13/2011  Post procedure Call Back phone  # [phone]  Permission to leave phone message Yes     Patient questions:  Do you have a fever, pain , or abdominal swelling? no Pain Score  0 *  Have you tolerated food without any problems? yes  Have you been able to return to your normal activities? yes  Do you have any questions about your discharge instructions: Diet   no Medications  no Follow up visit  no  Do you have questions or concerns about your Care? no  Actions: * If pain score is 4 or above: No action needed, pain <4.

## 2011-07-03 ENCOUNTER — Other Ambulatory Visit: Payer: Self-pay | Admitting: Internal Medicine

## 2011-07-04 ENCOUNTER — Telehealth: Payer: Self-pay | Admitting: Internal Medicine

## 2011-07-04 NOTE — Telephone Encounter (Signed)
To robin   

## 2011-07-04 NOTE — Telephone Encounter (Signed)
Please call pt re refill for Lantis and testing strips.

## 2011-07-05 MED ORDER — LANCETS MISC: Status: DC

## 2011-07-05 MED ORDER — GLUCOSE BLOOD VI STRP: ORAL_STRIP | Status: DC

## 2011-07-05 NOTE — Telephone Encounter (Signed)
Sent in prescription per patient request.  Sent in lancets and test strips.  The patient is not taking Lantus. Called the patient left message prescriptions requested have been sent in.

## 2011-07-20 ENCOUNTER — Telehealth: Payer: Self-pay

## 2011-07-20 DIAGNOSIS — F22 Delusional disorders: Secondary | ICD-10-CM

## 2011-07-20 DIAGNOSIS — H538 Other visual disturbances: Secondary | ICD-10-CM

## 2011-07-20 NOTE — Telephone Encounter (Signed)
The patient would like a referral to Dr. Nadyne Coombes

## 2011-07-20 NOTE — Telephone Encounter (Signed)
Done per emr 

## 2011-08-24 ENCOUNTER — Telehealth: Payer: Self-pay | Admitting: *Deleted

## 2011-08-24 MED ORDER — ATORVASTATIN CALCIUM 40 MG PO TABS: 40.0000 mg | ORAL_TABLET | Freq: Every day | ORAL | Status: DC

## 2011-08-24 NOTE — Telephone Encounter (Signed)
[facility] called requesting clarification on Amlodipine 5mg  and Simvastatin 40mg  and whether pt is to take both of these medications.

## 2011-08-24 NOTE — Telephone Encounter (Signed)
Ok to change the simvstatin to lipitor 40 mg per day - generic due to possible zocor/amlodipine interaction  Robin to let pt know

## 2011-08-25 ENCOUNTER — Telehealth: Payer: Self-pay | Admitting: Internal Medicine

## 2011-08-25 NOTE — Telephone Encounter (Signed)
Patient informed of medication change 

## 2011-08-25 NOTE — Telephone Encounter (Signed)
Nikki from [facility] called back concerning whether pt to take both Amlodipine and Simvastatin. Informed per Dr. Jonny Ruiz order in [facility] change Simvastatin to Lipitor 40mg .

## 2011-09-05 ENCOUNTER — Ambulatory Visit: Payer: 59 | Admitting: Internal Medicine

## 2011-09-05 ENCOUNTER — Other Ambulatory Visit: Payer: Self-pay

## 2011-09-05 MED ORDER — MOMETASONE FUROATE 50 MCG/ACT NA SUSP: 2.0000 | Freq: Every day | NASAL | Status: DC

## 2011-09-18 ENCOUNTER — Encounter: Payer: Self-pay | Admitting: Internal Medicine

## 2011-09-18 ENCOUNTER — Other Ambulatory Visit (INDEPENDENT_AMBULATORY_CARE_PROVIDER_SITE_OTHER): Payer: 59

## 2011-09-18 DIAGNOSIS — Z0000 Encounter for general adult medical examination without abnormal findings: Secondary | ICD-10-CM

## 2011-09-18 DIAGNOSIS — E119 Type 2 diabetes mellitus without complications: Secondary | ICD-10-CM

## 2011-09-18 LAB — HEPATIC FUNCTION PANEL
AST: 24 U/L (ref 0–37)
Albumin: 3.9 g/dL (ref 3.5–5.2)
Alkaline Phosphatase: 86 U/L (ref 39–117)
Bilirubin, Direct: 0.1 mg/dL (ref 0.0–0.3)
Total Protein: 6.6 g/dL (ref 6.0–8.3)

## 2011-09-18 LAB — MICROALBUMIN / CREATININE URINE RATIO
Creatinine,U: 22.7 mg/dL
Microalb Creat Ratio: 2.2 mg/g (ref 0.0–30.0)
Microalb, Ur: 0.5 mg/dL (ref 0.0–1.9)

## 2011-09-18 LAB — CBC WITH DIFFERENTIAL/PLATELET
Basophils Absolute: 0 10*3/uL (ref 0.0–0.1)
Eosinophils Absolute: 0.1 10*3/uL (ref 0.0–0.7)
HCT: 37.8 % (ref 36.0–46.0)
Hemoglobin: 12.4 g/dL (ref 12.0–15.0)
Lymphocytes Relative: 22.4 % (ref 12.0–46.0)
Lymphs Abs: 1.5 10*3/uL (ref 0.7–4.0)
MCHC: 32.9 g/dL (ref 30.0–36.0)
Neutro Abs: 4.6 10*3/uL (ref 1.4–7.7)
Platelets: 278 10*3/uL (ref 150.0–400.0)
RDW: 13.1 % (ref 11.5–14.6)

## 2011-09-18 LAB — URINALYSIS, ROUTINE W REFLEX MICROSCOPIC
Ketones, ur: NEGATIVE
Nitrite: NEGATIVE
Urobilinogen, UA: 0.2 (ref 0.0–1.0)

## 2011-09-18 LAB — LIPID PANEL
HDL: 56.5 mg/dL (ref >39.00)
Total CHOL/HDL Ratio: 2

## 2011-09-18 LAB — BASIC METABOLIC PANEL
CO2: 27 mEq/L (ref 19–32)
Calcium: 9 mg/dL (ref 8.4–10.5)
Glucose, Bld: 95 mg/dL (ref 70–99)
Potassium: 4.1 mEq/L (ref 3.5–5.1)
Sodium: 140 mEq/L (ref 135–145)

## 2011-09-18 LAB — HEMOGLOBIN A1C: Hgb A1c MFr Bld: 5.5 % (ref 4.6–6.5)

## 2011-10-09 ENCOUNTER — Other Ambulatory Visit: Payer: Self-pay | Admitting: Obstetrics

## 2011-10-09 ENCOUNTER — Other Ambulatory Visit (INDEPENDENT_AMBULATORY_CARE_PROVIDER_SITE_OTHER): Payer: Self-pay | Admitting: Surgery

## 2011-10-09 DIAGNOSIS — Z1231 Encounter for screening mammogram for malignant neoplasm of breast: Secondary | ICD-10-CM

## 2011-10-12 ENCOUNTER — Telehealth: Payer: Self-pay | Admitting: Internal Medicine

## 2011-10-12 NOTE — Telephone Encounter (Signed)
Caller: Jesicca/Patient; Phone: [phone]; Reason for Call: Patient calling because she received the wrong Lantus needles.  Please call her back.  Thanks

## 2011-10-12 NOTE — Telephone Encounter (Signed)
Called the patient left message to call back 

## 2011-10-13 NOTE — Telephone Encounter (Signed)
Called the patient and she received the wrong Lancets.  Sent in the correct ones to her pharmacy.  The onetouch ultra.

## 2011-10-16 MED ORDER — GLUCOSE BLOOD VI STRP: ORAL_STRIP | Status: DC

## 2011-10-25 ENCOUNTER — Ambulatory Visit: Payer: 59 | Admitting: Internal Medicine

## 2011-11-07 ENCOUNTER — Ambulatory Visit (INDEPENDENT_AMBULATORY_CARE_PROVIDER_SITE_OTHER): Payer: 59 | Admitting: Internal Medicine

## 2011-11-07 ENCOUNTER — Encounter: Payer: Self-pay | Admitting: Internal Medicine

## 2011-11-07 VITALS — BP 120/80 | HR 69 | Temp 98.1°F | Ht 59.0 in | Wt 149.0 lb

## 2011-11-07 DIAGNOSIS — Z0000 Encounter for general adult medical examination without abnormal findings: Secondary | ICD-10-CM

## 2011-11-07 DIAGNOSIS — Z23 Encounter for immunization: Secondary | ICD-10-CM

## 2011-11-07 MED ORDER — GUAIFENESIN ER 600 MG PO TB12: 600.0000 mg | ORAL_TABLET | Freq: Two times a day (BID) | ORAL | Status: DC

## 2011-11-07 MED ORDER — CALCIUM CARBONATE-VITAMIN D 500-200 MG-UNIT PO TABS: 1.0000 | ORAL_TABLET | Freq: Three times a day (TID) | ORAL | Status: DC

## 2011-11-07 NOTE — Patient Instructions (Addendum)
You had the flu shot today, and the shingles shot You would need the pneumonia shot next year Please continue your efforts at being more active, low sodium and low cholesterol diet, and weight control. You are given the copy of your blood work You are otherwise up to date with prevention Continue all other medications as before Please have the pharmacy call with any refills you may need. Please remember to sign up for My Chart at your earliest convenience, as this will be important to you in the future with finding out test results. Please return in 6 months, or sooner if needed

## 2011-11-09 ENCOUNTER — Encounter: Payer: Self-pay | Admitting: Internal Medicine

## 2011-11-09 NOTE — Progress Notes (Signed)
Subjective:    Patient ID: Katherine [name], female    DOB: Feb 02, 1945, 66 y.o.   MRN: [id number]  HPI  Here for wellness and f/u;  Overall doing ok;  Pt denies CP, worsening SOB, DOE, wheezing, orthopnea, PND, worsening LE edema, palpitations, dizziness or syncope.  Pt denies neurological change such as new Headache, facial or extremity weakness.  Pt denies polydipsia, polyuria, or low sugar symptoms. Pt states overall good compliance with treatment and medications, good tolerability, and trying to follow lower cholesterol diet.  Pt denies worsening depressive symptoms, suicidal ideation or panic. No fever, wt loss, night sweats, loss of appetite, or other constitutional symptoms.  Pt states good ability with ADL's, low fall risk, home safety reviewed and adequate, no significant changes in hearing or vision, and occasionally active with exercise.  No acute complaints Past Medical History  Diagnosis Date  . ABDOMINAL PAIN, LOWER 02/04/2010  . ABNORMAL THYROID FUNCTION TESTS 08/04/2009  . ALLERGIC RHINITIS 09/05/2006  . ANXIETY 07/31/2007  . DEPRESSION 09/05/2006  . DIVERTICULOSIS, COLON 09/05/2006  . Dysuria 04/15/2010  . Failure to thrive in childhood 04/06/2010  . FATIGUE 04/15/2010  . FIBROMYALGIA 05/10/2009  . FRACTURE, PELVIS, RIGHT 04/15/2010  . GASTROENTERITIS, ACUTE 12/25/2008  . GERD 09/05/2006  . HYPERLIPIDEMIA 09/05/2006  . HYPERTENSION 09/05/2006  . KNEE PAIN, LEFT 02/04/2010  . OSTEOARTHRITIS 09/05/2006  . OSTEOPOROSIS 07/31/2007  . Other specified forms of hearing loss 11/01/2009  . SINUSITIS- ACUTE-NOS 10/09/2007  . SKIN LESION 05/10/2009  . SPRAIN&STRAIN OTH SPEC SITES SHOULDER&UPPER ARM 11/01/2009  . Unspecified hearing loss 10/09/2007  . URI 02/20/2007  . UTI 02/20/2007  . Blood transfusion   . Cataract   . Heart murmur   . DIABETES MELLITUS, TYPE II 07/31/2007    no meds   Past Surgical History  Procedure Date  . Tonsillectomy   . Breast biopsy   . Abdominal hysterectomy 12/08    Bladder tac,  partial hysterectomy  . Inguinal herniorrhapy     right Dr. Freida Busman 06/2009  . Catarct removal     both eyes    reports that she has never smoked. She does not have any smokeless tobacco history on file. She reports that she does not drink alcohol or use illicit drugs. family history includes Coronary artery disease in her other and Diabetes in her other.  There is no history of Colon cancer, and Esophageal cancer, and Stomach cancer, and Rectal cancer, . Allergies  Allergen Reactions  . Penicillins     "made me nervous"  . Thioridazine Hcl Itching   Current Outpatient Prescriptions on File Prior to Visit  Medication Sig Dispense Refill  . albuterol (PROVENTIL HFA;VENTOLIN HFA) 108 (90 BASE) MCG/ACT inhaler Inhale 2 puffs into the lungs every 6 (six) hours as needed.  1 Inhaler  5  . alendronate (FOSAMAX) 35 MG tablet Take 1 tablet (35 mg total) by mouth every 7 (seven) days. Take with a full glass of water on an empty stomach.  4 tablet  10  . amLODipine (NORVASC) 5 MG tablet Take 1 tablet by mouth every day  90 tablet  1  . aspirin 81 MG tablet Take 81 mg by mouth daily.        [name] atorvastatin (LIPITOR) 40 MG tablet Take 1 tablet (40 mg total) by mouth daily.  90 tablet  3  . azelastine (ASTELIN) 137 MCG/SPRAY nasal spray Place 2 sprays into the nose daily as needed for rhinitis. Use in each nostril as  directed  30 mL  5  . benztropine (COGENTIN) 0.5 MG tablet Take 0.5 mg by mouth 2 (two) times daily.        . cetirizine (ZYRTEC) 10 MG tablet Take 1 tablet (10 mg total) by mouth daily.  30 tablet  11  . doxazosin (CARDURA) 8 MG tablet Take 1 tablet (8 mg total) by mouth at bedtime.  30 tablet  11  . Electronic Thermometer (DIGITAL THERMOMETER) MISC Use as directed       . fexofenadine (ALLEGRA) 180 MG tablet Take 1 tablet (180 mg total) by mouth daily.  30 tablet  5  . glucose blood (ONE TOUCH ULTRA TEST) test strip Use as directed once daily to check blood sugar.  Diagnosis code 250.00        . glucose blood (ONE TOUCH ULTRA TEST) test strip Use 1 strip daily to check blood sugars. Dx: 250.00  100 each  3  . ipratropium (ATROVENT HFA) 17 MCG/ACT inhaler Inhale 1 puff into the lungs 3 (three) times daily.        [name] ipratropium (ATROVENT) 0.06 % nasal spray Place 1 spray into the nose 3 (three) times daily.  15 mL  5  . Lancets MISC Use as directed once daily. 250.02  100 each  11  . methocarbamol (ROBAXIN) 500 MG tablet 1-2 by mouth every 6 hours for muscle spasms       . mometasone (NASONEX) 50 MCG/ACT nasal spray Place 2 sprays into the nose daily.  17 g  10  . Omeprazole 20 MG TBEC Take 1 tablet (20 mg total) by mouth 2 (two) times daily.  60 each  10  . ONE TOUCH LANCETS MISC Use 1 daily to help check blood sugar  100 each  1  . promethazine (PHENERGAN) 12.5 MG tablet Take 12.5 mg by mouth every 6 (six) hours as needed.        . risperiDONE (RISPERDAL) 3 MG tablet Take 3 mg by mouth daily.        . traMADol (ULTRAM-ER) 200 MG 24 hr tablet Take 1 tablet (200 mg total) by mouth daily as needed.  90 tablet  1  . TraMADol HCl 200 MG TB24 Take 1 tablet by mouth daily as needed  90 each  0  . calcium-vitamin D (OSCAL WITH D) 500-200 MG-UNIT per tablet Take 1 tablet by mouth 3 (three) times daily. With meals  270 tablet  3  . pregabalin (LYRICA) 100 MG capsule Take 1 capsule (100 mg total) by mouth 3 (three) times daily.  180 capsule  3   Review of Systems Review of Systems  Constitutional: Negative for diaphoresis, activity change, appetite change and unexpected weight change.  HENT: Negative for hearing loss, ear pain, facial swelling, mouth sores and neck stiffness.   Eyes: Negative for pain, redness and visual disturbance.  Respiratory: Negative for shortness of breath and wheezing.   Cardiovascular: Negative for chest pain and palpitations.  Gastrointestinal: Negative for diarrhea, blood in stool, abdominal distention and rectal pain.  Genitourinary: Negative for hematuria, flank  pain and decreased urine volume.  Musculoskeletal: Negative for myalgias and joint swelling.  Skin: Negative for color change and wound.  Neurological: Negative for syncope and numbness.  Hematological: Negative for adenopathy.  Psychiatric/Behavioral: Negative for hallucinations, self-injury, decreased concentration and agitation.      Objective:   Physical Exam BP 120/80  Pulse 69  Temp 98.1 F (36.7 C) (Oral)  Ht 4\' 11"  (1.499 m)  Wt 149 lb (67.586 kg)  BMI 30.09 kg/m2  SpO2 96% Physical Exam  VS noted Constitutional: Pt is oriented to person, place, and time. Appears well-developed and well-nourished.  HENT:  Head: Normocephalic and atraumatic.  Right Ear: External ear normal.  Left Ear: External ear normal.  Nose: Nose normal.  Mouth/Throat: Oropharynx is clear and moist.  Eyes: Conjunctivae and EOM are normal. Pupils are equal, round, and reactive to light.  Neck: Normal range of motion. Neck supple. No JVD present. No tracheal deviation present.  Cardiovascular: Normal rate, regular rhythm, normal heart sounds and intact distal pulses.   Pulmonary/Chest: Effort normal and breath sounds normal.  Abdominal: Soft. Bowel sounds are normal. There is no tenderness.  Musculoskeletal: Normal range of motion. Exhibits no edema.  Lymphadenopathy:  Has no cervical adenopathy.  Neurological: Pt is alert and oriented to person, place, and time. Pt has normal reflexes. No cranial nerve deficit.  Skin: Skin is warm and dry. No rash noted.  Psychiatric:  Has  normal mood and affect. Behavior is normal.     Assessment & Plan:

## 2011-11-09 NOTE — Assessment & Plan Note (Signed)

## 2011-11-10 ENCOUNTER — Ambulatory Visit: Payer: 59

## 2011-11-16 ENCOUNTER — Ambulatory Visit
Admission: RE | Admit: 2011-11-16 | Discharge: 2011-11-16 | Disposition: A | Discharge status: Home / Self Care | Payer: PRIVATE HEALTH INSURANCE | Source: Ambulatory Visit | Attending: Obstetrics | Admitting: Obstetrics

## 2011-11-16 DIAGNOSIS — Z1231 Encounter for screening mammogram for malignant neoplasm of breast: Secondary | ICD-10-CM

## 2011-11-22 ENCOUNTER — Ambulatory Visit: Payer: PRIVATE HEALTH INSURANCE | Admitting: Internal Medicine

## 2011-11-22 ENCOUNTER — Ambulatory Visit: Payer: 59

## 2011-12-07 ENCOUNTER — Ambulatory Visit (INDEPENDENT_AMBULATORY_CARE_PROVIDER_SITE_OTHER): Payer: PRIVATE HEALTH INSURANCE | Admitting: Internal Medicine

## 2011-12-07 ENCOUNTER — Encounter: Payer: Self-pay | Admitting: Internal Medicine

## 2011-12-07 VITALS — BP 130/78 | HR 65 | Temp 97.0°F | Ht 59.0 in | Wt 153.5 lb

## 2011-12-07 DIAGNOSIS — J019 Acute sinusitis, unspecified: Secondary | ICD-10-CM | POA: Insufficient documentation

## 2011-12-07 DIAGNOSIS — E119 Type 2 diabetes mellitus without complications: Secondary | ICD-10-CM

## 2011-12-07 DIAGNOSIS — J309 Allergic rhinitis, unspecified: Secondary | ICD-10-CM

## 2011-12-07 MED ORDER — METHYLPREDNISOLONE ACETATE 80 MG/ML IJ SUSP
120.0000 mg | Freq: Once | INTRAMUSCULAR | Status: AC
  Administered 2011-12-07: 120 mg via INTRAMUSCULAR

## 2011-12-07 MED ORDER — AZITHROMYCIN 250 MG PO TABS: ORAL_TABLET | ORAL | Status: DC

## 2011-12-07 NOTE — Patient Instructions (Addendum)
Take all new medications as prescribed - the antibiotic You had the steroid shot today Continue all other medications as before Please have the pharmacy call with any refills you may need.

## 2011-12-09 ENCOUNTER — Encounter: Payer: Self-pay | Admitting: Internal Medicine

## 2011-12-09 NOTE — Assessment & Plan Note (Signed)
Mild to mod, for antibx course,  to f/u any worsening symptoms or concerns 

## 2011-12-09 NOTE — Assessment & Plan Note (Signed)
stable overall by hx and exam, most recent data reviewed with pt, and pt to continue medical treatment as before Lab Results  Component Value Date   HGBA1C 5.5 09/18/2011   Pt to call for onset polys or cbg > 200 on steroid tx

## 2011-12-09 NOTE — Assessment & Plan Note (Signed)
With mild flare, for depomedrol IM,  to f/u any worsening symptoms or concerns

## 2011-12-09 NOTE — Progress Notes (Signed)
Subjective:    Patient ID: Katherine [name], female    DOB: 03-Apr-1945, 66 y.o.   MRN: [id number]  HPI   Here with 3 days acute onset fever, facial pain, pressure, general weakness and malaise, and greenish d/c, with slight ST, but little to no cough and Pt denies chest pain, increased sob or doe, wheezing, orthopnea, PND, increased LE swelling, palpitations, dizziness or syncope.  Does have several wks ongoing nasal allergy symptoms with clear congestion, itch and sneeze, without fever, pain, ST, cough or wheezing.   Pt denies fever, wt loss, night sweats, loss of appetite, or other constitutional symptoms except for the above.  Pt denies polydipsia, polyuria, or low sugar symptoms such as weakness or confusion improved with po intake.  Pt states overall good compliance with meds, trying to follow lower cholesterol, diabetic diet, wt overall stable but little exercise however.   Denies worsening depressive symptoms, suicidal ideation, or panic, though has ongoing anxiety, not increased recently.  Past Medical History  Diagnosis Date  . ABDOMINAL PAIN, LOWER 02/04/2010  . ABNORMAL THYROID FUNCTION TESTS 08/04/2009  . ALLERGIC RHINITIS 09/05/2006  . ANXIETY 07/31/2007  . DEPRESSION 09/05/2006  . DIVERTICULOSIS, COLON 09/05/2006  . Dysuria 04/15/2010  . Failure to thrive in childhood 04/06/2010  . FATIGUE 04/15/2010  . FIBROMYALGIA 05/10/2009  . FRACTURE, PELVIS, RIGHT 04/15/2010  . GASTROENTERITIS, ACUTE 12/25/2008  . GERD 09/05/2006  . HYPERLIPIDEMIA 09/05/2006  . HYPERTENSION 09/05/2006  . KNEE PAIN, LEFT 02/04/2010  . OSTEOARTHRITIS 09/05/2006  . OSTEOPOROSIS 07/31/2007  . Other specified forms of hearing loss 11/01/2009  . SINUSITIS- ACUTE-NOS 10/09/2007  . SKIN LESION 05/10/2009  . SPRAIN&STRAIN OTH SPEC SITES SHOULDER&UPPER ARM 11/01/2009  . Unspecified hearing loss 10/09/2007  . URI 02/20/2007  . UTI 02/20/2007  . Blood transfusion   . Cataract   . Heart murmur   . DIABETES MELLITUS, TYPE II 07/31/2007    no meds     Past Surgical History  Procedure Date  . Tonsillectomy   . Breast biopsy   . Abdominal hysterectomy 12/08    Bladder tac, partial hysterectomy  . Inguinal herniorrhapy     right Dr. Freida Busman 06/2009  . Catarct removal     both eyes    reports that she has never smoked. She does not have any smokeless tobacco history on file. She reports that she does not drink alcohol or use illicit drugs. family history includes Coronary artery disease in her other and Diabetes in her other.  There is no history of Colon cancer, and Esophageal cancer, and Stomach cancer, and Rectal cancer, . Allergies  Allergen Reactions  . Penicillins     "made me nervous"  . Thioridazine Hcl Itching   Current Outpatient Prescriptions on File Prior to Visit  Medication Sig Dispense Refill  . albuterol (PROVENTIL HFA;VENTOLIN HFA) 108 (90 BASE) MCG/ACT inhaler Inhale 2 puffs into the lungs every 6 (six) hours as needed.  1 Inhaler  5  . alendronate (FOSAMAX) 35 MG tablet Take 1 tablet (35 mg total) by mouth every 7 (seven) days. Take with a full glass of water on an empty stomach.  4 tablet  10  . amLODipine (NORVASC) 5 MG tablet Take 1 tablet by mouth every day  90 tablet  1  . aspirin 81 MG tablet Take 81 mg by mouth daily.        [name] atorvastatin (LIPITOR) 40 MG tablet Take 1 tablet (40 mg total) by mouth daily.  90 tablet  3  . azelastine (ASTELIN) 137 MCG/SPRAY nasal spray Place 2 sprays into the nose daily as needed for rhinitis. Use in each nostril as directed  30 mL  5  . benztropine (COGENTIN) 0.5 MG tablet Take 0.5 mg by mouth 2 (two) times daily.        . calcium-vitamin D (OSCAL WITH D) 500-200 MG-UNIT per tablet Take 1 tablet by mouth 3 (three) times daily. With meals  270 tablet  3  . cetirizine (ZYRTEC) 10 MG tablet Take 1 tablet (10 mg total) by mouth daily.  30 tablet  11  . doxazosin (CARDURA) 8 MG tablet Take 1 tablet (8 mg total) by mouth at bedtime.  30 tablet  11  . Electronic Thermometer (DIGITAL  THERMOMETER) MISC Use as directed       . fexofenadine (ALLEGRA) 180 MG tablet Take 1 tablet (180 mg total) by mouth daily.  30 tablet  5  . glucose blood (ONE TOUCH ULTRA TEST) test strip Use as directed once daily to check blood sugar.  Diagnosis code 250.00      . glucose blood (ONE TOUCH ULTRA TEST) test strip Use 1 strip daily to check blood sugars. Dx: 250.00  100 each  3  . guaiFENesin (MUCINEX) 600 MG 12 hr tablet Take 1 tablet (600 mg total) by mouth 2 (two) times daily.  180 tablet  3  . ipratropium (ATROVENT HFA) 17 MCG/ACT inhaler Inhale 1 puff into the lungs 3 (three) times daily.        [name] ipratropium (ATROVENT) 0.06 % nasal spray Place 1 spray into the nose 3 (three) times daily.  15 mL  5  . Lancets MISC Use as directed once daily. 250.02  100 each  11  . methocarbamol (ROBAXIN) 500 MG tablet 1-2 by mouth every 6 hours for muscle spasms       . mometasone (NASONEX) 50 MCG/ACT nasal spray Place 2 sprays into the nose daily.  17 g  10  . Omeprazole 20 MG TBEC Take 1 tablet (20 mg total) by mouth 2 (two) times daily.  60 each  10  . ONE TOUCH LANCETS MISC Use 1 daily to help check blood sugar  100 each  1  . promethazine (PHENERGAN) 12.5 MG tablet Take 12.5 mg by mouth every 6 (six) hours as needed.        . risperiDONE (RISPERDAL) 3 MG tablet Take 3 mg by mouth daily.        . traMADol (ULTRAM-ER) 200 MG 24 hr tablet Take 1 tablet (200 mg total) by mouth daily as needed.  90 tablet  1  . TraMADol HCl 200 MG TB24 Take 1 tablet by mouth daily as needed  90 each  0  . pregabalin (LYRICA) 100 MG capsule Take 1 capsule (100 mg total) by mouth 3 (three) times daily.  180 capsule  3   Review of Systems  Constitutional: Negative for diaphoresis and unexpected weight change.  HENT: Negative for tinnitus.   Eyes: Negative for photophobia and visual disturbance.  Respiratory: Negative for choking and stridor.   Gastrointestinal: Negative for vomiting and blood in stool.  Genitourinary:  Negative for hematuria and decreased urine volume.  Musculoskeletal: Negative for gait problem.  Skin: Negative for color change and wound.  Neurological: Negative for tremors and numbness.  Psychiatric/Behavioral: Negative for decreased concentration. The patient is not hyperactive.       Objective:   Physical Exam BP 130/78  Pulse 65  Temp  97 F (36.1 C) (Oral)  Ht 4\' 11"  (1.499 m)  Wt 153 lb 8 oz (69.627 kg)  BMI 31.00 kg/m2  SpO2 96% Physical Exam  VS noted, mild ill Constitutional: Pt appears well-developed and well-nourished.  HENT: Head: Normocephalic.  Right Ear: External ear normal.  Left Ear: External ear normal.  Bilat tm's mild erythema.  Sinus tender.  Pharynx mild erythema Eyes: Conjunctivae and EOM are normal. Pupils are equal, round, and reactive to light.  Neck: Normal range of motion. Neck supple.  Cardiovascular: Normal rate and regular rhythm.   Pulmonary/Chest: Effort normal and breath sounds normal.  Neurological: Pt is alert. Not confused  Skin: Skin is warm. No erythema.  Psychiatric: Pt behavior is normal. Thought content normal.     Assessment & Plan:

## 2011-12-27 ENCOUNTER — Other Ambulatory Visit: Payer: Self-pay | Admitting: Internal Medicine

## 2012-01-29 ENCOUNTER — Other Ambulatory Visit: Payer: Self-pay | Admitting: Internal Medicine

## 2012-02-20 ENCOUNTER — Other Ambulatory Visit: Payer: Self-pay | Admitting: Internal Medicine

## 2012-02-23 ENCOUNTER — Other Ambulatory Visit: Payer: Self-pay | Admitting: Internal Medicine

## 2012-05-09 ENCOUNTER — Ambulatory Visit (INDEPENDENT_AMBULATORY_CARE_PROVIDER_SITE_OTHER): Payer: PRIVATE HEALTH INSURANCE | Admitting: Internal Medicine

## 2012-05-09 ENCOUNTER — Encounter: Payer: Self-pay | Admitting: Internal Medicine

## 2012-05-09 ENCOUNTER — Other Ambulatory Visit (INDEPENDENT_AMBULATORY_CARE_PROVIDER_SITE_OTHER): Payer: PRIVATE HEALTH INSURANCE

## 2012-05-09 VITALS — BP 132/86 | HR 71 | Temp 97.3°F | Ht 59.0 in | Wt 159.1 lb

## 2012-05-09 DIAGNOSIS — Z0000 Encounter for general adult medical examination without abnormal findings: Secondary | ICD-10-CM

## 2012-05-09 DIAGNOSIS — R011 Cardiac murmur, unspecified: Secondary | ICD-10-CM

## 2012-05-09 DIAGNOSIS — Z2911 Encounter for prophylactic immunotherapy for respiratory syncytial virus (RSV): Secondary | ICD-10-CM

## 2012-05-09 DIAGNOSIS — E119 Type 2 diabetes mellitus without complications: Secondary | ICD-10-CM

## 2012-05-09 DIAGNOSIS — Z23 Encounter for immunization: Secondary | ICD-10-CM

## 2012-05-09 LAB — LIPID PANEL
HDL: 48.6 mg/dL (ref >39.00)
LDL Cholesterol: 59 mg/dL (ref 0–99)
Total CHOL/HDL Ratio: 2
VLDL: 13 mg/dL (ref 0.0–40.0)

## 2012-05-09 LAB — BASIC METABOLIC PANEL
Calcium: 9.5 mg/dL (ref 8.4–10.5)
GFR: 119.8 mL/min (ref >60.00)
Sodium: 138 mEq/L (ref 135–145)

## 2012-05-09 LAB — HEPATIC FUNCTION PANEL
Alkaline Phosphatase: 74 U/L (ref 39–117)
Bilirubin, Direct: 0.2 mg/dL (ref 0.0–0.3)

## 2012-05-09 MED ORDER — PROMETHAZINE HCL 12.5 MG PO TABS: 12.5000 mg | ORAL_TABLET | Freq: Four times a day (QID) | ORAL | Status: DC | PRN

## 2012-05-09 NOTE — Assessment & Plan Note (Signed)
For echo, r/o AS 

## 2012-05-09 NOTE — Progress Notes (Signed)
Subjective:    Patient ID: Katherine [name], female    DOB: [birth year], 67 y.o.   MRN: [id number]  HPI  Here to f/u with sister; overall doing ok,  Pt denies chest pain, increased sob or doe, wheezing, orthopnea, PND, increased LE swelling, palpitations, dizziness or syncope.  Pt denies polydipsia, polyuria, or low sugar symptoms such as weakness or confusion improved with po intake.  Pt denies new neurological symptoms such as new headache, or facial or extremity weakness or numbness.   Pt states overall good compliance with meds, has been trying to follow lower cholesterol, diabetic diet, with wt overall stable,  but little exercise however. No acute complaints Past Medical History  Diagnosis Date  . ABDOMINAL PAIN, LOWER 02/04/2010  . ABNORMAL THYROID FUNCTION TESTS 08/04/2009  . ALLERGIC RHINITIS 09/05/2006  . ANXIETY 07/31/2007  . DEPRESSION 09/05/2006  . DIVERTICULOSIS, COLON 09/05/2006  . Dysuria 04/15/2010  . Failure to thrive in childhood 04/06/2010  . FATIGUE 04/15/2010  . FIBROMYALGIA 05/10/2009  . FRACTURE, PELVIS, RIGHT 04/15/2010  . GASTROENTERITIS, ACUTE 12/25/2008  . GERD 09/05/2006  . HYPERLIPIDEMIA 09/05/2006  . HYPERTENSION 09/05/2006  . KNEE PAIN, LEFT 02/04/2010  . OSTEOARTHRITIS 09/05/2006  . OSTEOPOROSIS 07/31/2007  . Other specified forms of hearing loss 11/01/2009  . SINUSITIS- ACUTE-NOS 10/09/2007  . SKIN LESION 05/10/2009  . SPRAIN&STRAIN OTH SPEC SITES SHOULDER&UPPER ARM 11/01/2009  . Unspecified hearing loss 10/09/2007  . URI 02/20/2007  . UTI 02/20/2007  . Blood transfusion   . Cataract   . Heart murmur   . DIABETES MELLITUS, TYPE II 07/31/2007    no meds   Past Surgical History  Procedure Laterality Date  . Tonsillectomy    . Breast biopsy    . Abdominal hysterectomy  12/08    Bladder tac, partial hysterectomy  . Inguinal herniorrhapy      right Dr. Freida Busman 06/2009  . Catarct removal      both eyes    reports that she has never smoked. She does not have any smokeless tobacco  history on file. She reports that she does not drink alcohol or use illicit drugs. family history includes Coronary artery disease in her other and Diabetes in her other.  There is no history of Colon cancer, and Esophageal cancer, and Stomach cancer, and Rectal cancer, . Allergies  Allergen Reactions  . Penicillins     "made me nervous"  . Thioridazine Hcl Itching   Current Outpatient Prescriptions on File Prior to Visit  Medication Sig Dispense Refill  . albuterol (PROVENTIL HFA;VENTOLIN HFA) 108 (90 BASE) MCG/ACT inhaler Inhale 2 puffs into the lungs every 6 (six) hours as needed.  1 Inhaler  5  . amLODipine (NORVASC) 5 MG tablet Take 1 tablet by mouth every day  90 tablet  3  . aspirin 81 MG tablet Take 81 mg by mouth daily.        [name] atorvastatin (LIPITOR) 40 MG tablet Take 1 tablet (40 mg total) by mouth daily.  90 tablet  3  . azelastine (ASTELIN) 137 MCG/SPRAY nasal spray Place 2 sprays into the nose daily as needed for rhinitis. Use in each nostril as directed  30 mL  4  . benztropine (COGENTIN) 0.5 MG tablet Take 0.5 mg by mouth 2 (two) times daily.        . calcium-vitamin D (OSCAL WITH D) 500-200 MG-UNIT per tablet Take 1 tablet by mouth 3 (three) times daily. With meals  270 tablet  3  .  cetirizine (ZYRTEC) 10 MG tablet Take 1 tablet (10 mg total) by mouth daily.  30 tablet  10  . doxazosin (CARDURA) 8 MG tablet Take 1 tablet (8 mg total) by mouth at bedtime.  30 tablet  11  . Electronic Thermometer (DIGITAL THERMOMETER) MISC Use as directed       . glucose blood (ONE TOUCH ULTRA TEST) test strip Use as directed once daily to check blood sugar.  Diagnosis code 250.00      . glucose blood (ONE TOUCH ULTRA TEST) test strip Use 1 strip daily to check blood sugars. Dx: 250.00  100 each  3  . guaiFENesin (MUCINEX) 600 MG 12 hr tablet Take 1 tablet (600 mg total) by mouth 2 (two) times daily.  180 tablet  3  . ipratropium (ATROVENT HFA) 17 MCG/ACT inhaler Inhale 1 puff into the lungs 3  (three) times daily.        [name] ipratropium (ATROVENT) 0.06 % nasal spray Place 1 spray into the nose 3 times daily.  15 mL  4  . Lancets MISC Use as directed once daily. 250.02  100 each  11  . methocarbamol (ROBAXIN) 500 MG tablet 1-2 by mouth every 6 hours for muscle spasms       . mometasone (NASONEX) 50 MCG/ACT nasal spray Place 2 sprays into the nose daily.  17 g  10  . omeprazole (PRILOSEC) 20 MG capsule Take 1 capsule by mouth twice a day  60 capsule  11  . Omeprazole 20 MG TBEC Take 1 tablet (20 mg total) by mouth 2 (two) times daily.  60 each  10  . ONE TOUCH LANCETS MISC Use 1 daily to help check blood sugar  100 each  1  . risperiDONE (RISPERDAL) 3 MG tablet Take 3 mg by mouth daily.        . traMADol (ULTRAM-ER) 200 MG 24 hr tablet Take 1 tablet (200 mg total) by mouth daily as needed.  90 tablet  1  . TraMADol HCl 200 MG TB24 Take 1 tablet by mouth daily as needed  90 each  0  . fexofenadine (ALLEGRA) 180 MG tablet Take 1 tablet (180 mg total) by mouth daily.  30 tablet  5  . pregabalin (LYRICA) 100 MG capsule Take 1 capsule (100 mg total) by mouth 3 (three) times daily.  180 capsule  3   No current facility-administered medications on file prior to visit.   Review of Systems Constitutional: Negative for diaphoresis, activity change, appetite change or unexpected weight change.  HENT: Negative for hearing loss, ear pain, facial swelling, mouth sores and neck stiffness.   Eyes: Negative for pain, redness and visual disturbance.  Respiratory: Negative for shortness of breath and wheezing.   Cardiovascular: Negative for chest pain and palpitations.  Gastrointestinal: Negative for diarrhea, blood in stool, abdominal distention or other pain Genitourinary: Negative for hematuria, flank pain or change in urine volume.  Musculoskeletal: Negative for myalgias and joint swelling.  Skin: Negative for color change and wound.  Neurological: Negative for syncope and numbness. other than  noted Hematological: Negative for adenopathy.  Psychiatric/Behavioral: Negative for hallucinations, self-injury, decreased concentration and agitation.      Objective:   Physical Exam BP 132/86  Pulse 71  Temp(Src) 97.3 F (36.3 C) (Oral)  Ht 4\' 11"  (1.499 m)  Wt 159 lb 2 oz (72.179 kg)  BMI 32.12 kg/m2  SpO2 95% VS noted,  Constitutional: Pt is oriented to person, place, and time. Appears  well-developed and well-nourished. [name] Head: Normocephalic and atraumatic.  Right Ear: External ear normal.  Left Ear: External ear normal.  Nose: Nose normal.  Mouth/Throat: Oropharynx is clear and moist.  Eyes: Conjunctivae and EOM are normal. Pupils are equal, round, and reactive to light.  Neck: Normal range of motion. Neck supple. No JVD present. No tracheal deviation present.  Cardiovascular: Normal rate, regular rhythm, normal heart sounds and intact distal pulses.  with grade 3/6 murmur RUSB Pulmonary/Chest: Effort normal and breath sounds normal.  Abdominal: Soft. Bowel sounds are normal. There is no tenderness. No HSM  Musculoskeletal: Normal range of motion. Exhibits no edema.  Lymphadenopathy:  Has no cervical adenopathy.  Neurological: Pt is alert and oriented to person, place, and time. Pt has normal reflexes. No cranial nerve deficit.  Skin: Skin is warm and dry. No rash noted.  Psychiatric:  Has  normal mood and affect. Behavior is normal.         Assessment & Plan:

## 2012-05-09 NOTE — Assessment & Plan Note (Signed)
stable overall by history and exam, recent data reviewed with pt, and pt to continue medical treatment as before,  to f/u any worsening symptoms or concerns Lab Results  Component Value Date   HGBA1C 5.0 05/09/2012

## 2012-05-09 NOTE — Assessment & Plan Note (Signed)

## 2012-05-09 NOTE — Patient Instructions (Addendum)
You had the shingles shot today OK to stop the fosamax as it has been more than 5 yrs Please continue all other medications as before, and refills have been done if requested - the phenergan You will be contacted regarding the referral for: echocardiogram for the ? Louder heart murmur Please go to the LAB in the Basement (turn left off the elevator) for the tests to be done today You will be contacted by phone if any changes need to be made immediately.  Otherwise, you will receive a letter about your results with an explanation Please continue your efforts at being more active, low cholesterol diabetic iet, and weight control. Please remember to sign up for My Chart if you have not done so, as this will be important to you in the future with finding out test results, communicating by private email, and scheduling acute appointments online when needed. Please return in 6 months, or sooner if needed, with Lab testing done 3-5 days before (if possible0

## 2012-05-22 ENCOUNTER — Other Ambulatory Visit (HOSPITAL_COMMUNITY): Payer: PRIVATE HEALTH INSURANCE

## 2012-05-22 ENCOUNTER — Telehealth: Payer: Self-pay

## 2012-05-22 NOTE — Telephone Encounter (Signed)
Prior authorization form completed and faxed to [phone], [facility]

## 2012-05-23 ENCOUNTER — Telehealth: Payer: Self-pay

## 2012-05-23 MED ORDER — DOXAZOSIN MESYLATE 8 MG PO TABS: 8.0000 mg | ORAL_TABLET | Freq: Every day | ORAL | Status: DC

## 2012-05-23 MED ORDER — ATORVASTATIN CALCIUM 40 MG PO TABS: 40.0000 mg | ORAL_TABLET | Freq: Every day | ORAL | Status: DC

## 2012-05-23 NOTE — Telephone Encounter (Signed)
refill 

## 2012-05-24 ENCOUNTER — Telehealth: Payer: Self-pay | Admitting: Internal Medicine

## 2012-05-24 MED ORDER — FLUTICASONE PROPIONATE 50 MCG/ACT NA SUSP: 2.0000 | Freq: Every day | NASAL | Status: DC

## 2012-05-24 NOTE — Telephone Encounter (Signed)
Called the patient and she would like flonase sent to [name].  The patient is experiencing some headaches, head stuffiness due to allergies.

## 2012-05-24 NOTE — Telephone Encounter (Signed)
medicatioin list already has zyrtec and astelin listed; if out, we can provide refill  Another option would be prescripton generic flonase  Another option would be OTC Nasacort as this is now newly avialable otc as well

## 2012-05-24 NOTE — Telephone Encounter (Signed)
The pollen is causing her allergies worse.  Is there something that can be called in to help? [facility] on [address] if called in today.

## 2012-05-24 NOTE — Telephone Encounter (Signed)
flonase done erx 

## 2012-05-27 ENCOUNTER — Encounter: Payer: Self-pay | Admitting: Internal Medicine

## 2012-05-27 ENCOUNTER — Ambulatory Visit (HOSPITAL_COMMUNITY): Payer: PRIVATE HEALTH INSURANCE | Attending: Internal Medicine

## 2012-05-27 DIAGNOSIS — M79609 Pain in unspecified limb: Secondary | ICD-10-CM | POA: Insufficient documentation

## 2012-05-27 DIAGNOSIS — I10 Essential (primary) hypertension: Secondary | ICD-10-CM | POA: Insufficient documentation

## 2012-05-27 DIAGNOSIS — E119 Type 2 diabetes mellitus without complications: Secondary | ICD-10-CM | POA: Insufficient documentation

## 2012-05-27 DIAGNOSIS — R011 Cardiac murmur, unspecified: Secondary | ICD-10-CM | POA: Insufficient documentation

## 2012-05-27 DIAGNOSIS — R5381 Other malaise: Secondary | ICD-10-CM | POA: Insufficient documentation

## 2012-05-27 DIAGNOSIS — R5383 Other fatigue: Secondary | ICD-10-CM | POA: Insufficient documentation

## 2012-05-27 DIAGNOSIS — E785 Hyperlipidemia, unspecified: Secondary | ICD-10-CM | POA: Insufficient documentation

## 2012-05-27 DIAGNOSIS — I350 Nonrheumatic aortic (valve) stenosis: Secondary | ICD-10-CM | POA: Insufficient documentation

## 2012-05-27 NOTE — Telephone Encounter (Signed)
Pt is aware.  

## 2012-05-27 NOTE — Progress Notes (Signed)
Echocardiogram performed.  

## 2012-06-05 ENCOUNTER — Telehealth: Payer: Self-pay | Admitting: Internal Medicine

## 2012-06-05 NOTE — Telephone Encounter (Signed)
Received request from Dr Konrad Felix to assist with manage pt osteopenia/hx of pelvic fx = osteoporosis  Has been on fosamax in past  OK for prolia - robin to ask pt to start, I will send request to [name] as well to assist with pt copay determination

## 2012-06-06 NOTE — Telephone Encounter (Signed)
Called the patient informed of MD instructions. 

## 2012-06-07 ENCOUNTER — Telehealth: Payer: Self-pay

## 2012-06-07 NOTE — Telephone Encounter (Signed)
Called the patient.  Left message to call back.

## 2012-06-07 NOTE — Telephone Encounter (Signed)
Phone call from pt wondering what the status is regarding her insurance authorizing payment for the injections for her joints? Please advise.

## 2012-06-07 NOTE — Telephone Encounter (Signed)
Informed the patient Katherine [name] Submitted her information to [employer] on Thursday 06/06/12 and should hear back from them by Tuesday of next week.  Informed the patient [name] would contact her once she hears back from them.

## 2012-06-19 ENCOUNTER — Telehealth: Payer: Self-pay

## 2012-06-19 NOTE — Telephone Encounter (Signed)
PA approval for Promethazine from 06/19/12 through 06/19/13

## 2012-06-26 ENCOUNTER — Other Ambulatory Visit: Payer: Self-pay | Admitting: Family Medicine

## 2012-06-26 DIAGNOSIS — M25512 Pain in left shoulder: Secondary | ICD-10-CM

## 2012-07-04 ENCOUNTER — Ambulatory Visit
Admission: RE | Admit: 2012-07-04 | Discharge: 2012-07-04 | Disposition: A | Discharge status: Home / Self Care | Payer: PRIVATE HEALTH INSURANCE | Source: Ambulatory Visit | Attending: Family Medicine | Admitting: Family Medicine

## 2012-07-04 DIAGNOSIS — M25512 Pain in left shoulder: Secondary | ICD-10-CM

## 2012-07-08 ENCOUNTER — Other Ambulatory Visit: Payer: Self-pay | Admitting: Family Medicine

## 2012-07-08 ENCOUNTER — Other Ambulatory Visit (HOSPITAL_COMMUNITY): Payer: Self-pay | Admitting: Family Medicine

## 2012-07-08 DIAGNOSIS — R9389 Abnormal findings on diagnostic imaging of other specified body structures: Secondary | ICD-10-CM

## 2012-07-08 DIAGNOSIS — M25512 Pain in left shoulder: Secondary | ICD-10-CM

## 2012-07-10 ENCOUNTER — Other Ambulatory Visit: Payer: PRIVATE HEALTH INSURANCE

## 2012-07-10 ENCOUNTER — Telehealth: Payer: Self-pay

## 2012-07-10 DIAGNOSIS — M8580 Other specified disorders of bone density and structure, unspecified site: Secondary | ICD-10-CM

## 2012-07-10 MED ORDER — LANCETS MISC: Status: DC

## 2012-07-10 NOTE — Telephone Encounter (Signed)
Order has been done, ok for Katherine [name] to schedule

## 2012-07-10 NOTE — Telephone Encounter (Signed)
The patients ortho would like her to have an updated done density.  Please advise

## 2012-07-11 ENCOUNTER — Encounter (HOSPITAL_COMMUNITY): Payer: PRIVATE HEALTH INSURANCE

## 2012-07-17 ENCOUNTER — Encounter (HOSPITAL_COMMUNITY): Payer: PRIVATE HEALTH INSURANCE

## 2012-07-23 ENCOUNTER — Ambulatory Visit (INDEPENDENT_AMBULATORY_CARE_PROVIDER_SITE_OTHER)
Admission: RE | Admit: 2012-07-23 | Discharge: 2012-07-23 | Disposition: A | Discharge status: Home / Self Care | Payer: PRIVATE HEALTH INSURANCE | Source: Ambulatory Visit | Attending: Internal Medicine | Admitting: Internal Medicine

## 2012-07-23 DIAGNOSIS — M949 Disorder of cartilage, unspecified: Secondary | ICD-10-CM

## 2012-07-23 DIAGNOSIS — M8580 Other specified disorders of bone density and structure, unspecified site: Secondary | ICD-10-CM

## 2012-07-24 ENCOUNTER — Other Ambulatory Visit: Payer: PRIVATE HEALTH INSURANCE

## 2012-07-31 ENCOUNTER — Other Ambulatory Visit: Payer: PRIVATE HEALTH INSURANCE

## 2012-08-08 ENCOUNTER — Telehealth: Payer: Self-pay | Admitting: Internal Medicine

## 2012-08-08 ENCOUNTER — Other Ambulatory Visit: Payer: Self-pay

## 2012-08-08 MED ORDER — MOMETASONE FUROATE 50 MCG/ACT NA SUSP: 2.0000 | Freq: Every day | NASAL | Status: DC

## 2012-08-08 MED ORDER — FLUTICASONE PROPIONATE 50 MCG/ACT NA SUSP: 2.0000 | Freq: Every day | NASAL | Status: DC

## 2012-08-08 NOTE — Telephone Encounter (Signed)
Called the patient informed of results per MD instructions.

## 2012-08-08 NOTE — Telephone Encounter (Signed)
Pt called stated that she had her bone density test done but never got the result back. Please call pt.

## 2012-08-20 ENCOUNTER — Ambulatory Visit (INDEPENDENT_AMBULATORY_CARE_PROVIDER_SITE_OTHER): Payer: PRIVATE HEALTH INSURANCE

## 2012-08-20 DIAGNOSIS — M810 Age-related osteoporosis without current pathological fracture: Secondary | ICD-10-CM

## 2012-08-20 MED ORDER — DENOSUMAB 60 MG/ML ~~LOC~~ SOLN
60.0000 mg | Freq: Once | SUBCUTANEOUS | Status: AC
  Administered 2012-08-20: 60 mg via SUBCUTANEOUS

## 2012-08-23 ENCOUNTER — Other Ambulatory Visit: Payer: PRIVATE HEALTH INSURANCE

## 2012-09-04 ENCOUNTER — Other Ambulatory Visit: Payer: Self-pay

## 2012-09-04 MED ORDER — AZELASTINE HCL 0.1 % NA SOLN: 2.0000 | Freq: Every day | NASAL | Status: DC

## 2012-09-04 MED ORDER — IPRATROPIUM BROMIDE 0.06 % NA SOLN: 1.0000 | Freq: Three times a day (TID) | NASAL | Status: DC

## 2012-09-17 ENCOUNTER — Telehealth: Payer: Self-pay

## 2012-09-17 MED ORDER — FLUTICASONE PROPIONATE 50 MCG/ACT NA SUSP: 2.0000 | Freq: Every day | NASAL | Status: DC

## 2012-09-17 NOTE — Telephone Encounter (Signed)
[facility] received a refill for Fluticasone and nasonex.  Informed similar meds. And which do you want the patient to be using.

## 2012-09-17 NOTE — Telephone Encounter (Signed)
Sent flonase to her [facility]  Ok d/c nasonex

## 2012-10-09 LAB — HM MAMMOGRAPHY: HM MAMMO: NEGATIVE

## 2012-10-10 ENCOUNTER — Other Ambulatory Visit: Payer: Self-pay

## 2012-10-10 MED ORDER — PROMETHAZINE HCL 12.5 MG PO TABS: 12.5000 mg | ORAL_TABLET | Freq: Four times a day (QID) | ORAL | Status: DC | PRN

## 2012-10-15 ENCOUNTER — Other Ambulatory Visit: Payer: Self-pay

## 2012-10-15 DIAGNOSIS — Z1231 Encounter for screening mammogram for malignant neoplasm of breast: Secondary | ICD-10-CM

## 2012-10-18 ENCOUNTER — Other Ambulatory Visit: Payer: Self-pay

## 2012-10-18 MED ORDER — GLUCOSE BLOOD VI STRP: ORAL_STRIP | Status: DC

## 2012-10-23 ENCOUNTER — Telehealth: Payer: Self-pay | Admitting: Internal Medicine

## 2012-10-23 MED ORDER — ONETOUCH LANCETS MISC: Status: DC

## 2012-10-23 NOTE — Telephone Encounter (Signed)
Pt needs needles for a One Touch Ultra.  She got the test strips

## 2012-10-24 ENCOUNTER — Other Ambulatory Visit: Payer: Self-pay

## 2012-10-24 ENCOUNTER — Other Ambulatory Visit: Payer: Self-pay | Admitting: *Deleted

## 2012-10-24 MED ORDER — ONETOUCH LANCETS MISC: Status: DC

## 2012-10-24 MED ORDER — ONETOUCH DELICA LANCETS 33G MISC: 1.0000 | Freq: Every day | Status: DC

## 2012-11-07 ENCOUNTER — Other Ambulatory Visit (INDEPENDENT_AMBULATORY_CARE_PROVIDER_SITE_OTHER): Payer: PRIVATE HEALTH INSURANCE

## 2012-11-07 ENCOUNTER — Encounter: Payer: Self-pay | Admitting: Internal Medicine

## 2012-11-07 ENCOUNTER — Ambulatory Visit (INDEPENDENT_AMBULATORY_CARE_PROVIDER_SITE_OTHER): Payer: PRIVATE HEALTH INSURANCE | Admitting: Internal Medicine

## 2012-11-07 VITALS — BP 140/82 | HR 101 | Temp 98.1°F | Ht 59.0 in | Wt 170.2 lb

## 2012-11-07 DIAGNOSIS — Z0000 Encounter for general adult medical examination without abnormal findings: Secondary | ICD-10-CM

## 2012-11-07 DIAGNOSIS — E041 Nontoxic single thyroid nodule: Secondary | ICD-10-CM

## 2012-11-07 DIAGNOSIS — IMO0001 Reserved for inherently not codable concepts without codable children: Secondary | ICD-10-CM

## 2012-11-07 DIAGNOSIS — Z23 Encounter for immunization: Secondary | ICD-10-CM

## 2012-11-07 LAB — BASIC METABOLIC PANEL
BUN: 14 mg/dL (ref 6–23)
CO2: 27 mEq/L (ref 19–32)
Calcium: 9.1 mg/dL (ref 8.4–10.5)
Chloride: 106 mEq/L (ref 96–112)
Creatinine, Ser: 0.6 mg/dL (ref 0.4–1.2)
Glucose, Bld: 106 mg/dL — ABNORMAL HIGH (ref 70–99)

## 2012-11-07 LAB — CBC WITH DIFFERENTIAL/PLATELET
Basophils Absolute: 0 10*3/uL (ref 0.0–0.1)
Basophils Relative: 0.4 % (ref 0.0–3.0)
Eosinophils Absolute: 0.2 10*3/uL (ref 0.0–0.7)
Hemoglobin: 12.6 g/dL (ref 12.0–15.0)
Lymphocytes Relative: 31.5 % (ref 12.0–46.0)
MCHC: 33 g/dL (ref 30.0–36.0)
Monocytes Relative: 9.4 % (ref 3.0–12.0)
Neutrophils Relative %: 56.4 % (ref 43.0–77.0)
RBC: 4.19 Mil/uL (ref 3.87–5.11)
WBC: 7.7 10*3/uL (ref 4.5–10.5)

## 2012-11-07 LAB — URINALYSIS, ROUTINE W REFLEX MICROSCOPIC
Hgb urine dipstick: NEGATIVE
Nitrite: NEGATIVE
Specific Gravity, Urine: <=1.005 (ref 1.000–1.030)
Total Protein, Urine: NEGATIVE
Urobilinogen, UA: 0.2 (ref 0.0–1.0)

## 2012-11-07 LAB — LIPID PANEL
Cholesterol: 135 mg/dL (ref 0–200)
HDL: 48.2 mg/dL (ref >39.00)
Triglycerides: 143 mg/dL (ref 0.0–149.0)

## 2012-11-07 LAB — HEPATIC FUNCTION PANEL
ALT: 27 U/L (ref 0–35)
Albumin: 4.2 g/dL (ref 3.5–5.2)
Bilirubin, Direct: 0.2 mg/dL (ref 0.0–0.3)
Total Protein: 7 g/dL (ref 6.0–8.3)

## 2012-11-07 LAB — MICROALBUMIN / CREATININE URINE RATIO: Microalb Creat Ratio: 1.1 mg/g (ref 0.0–30.0)

## 2012-11-07 MED ORDER — CALCIUM CARBONATE-VITAMIN D 500-200 MG-UNIT PO TABS: 1.0000 | ORAL_TABLET | Freq: Three times a day (TID) | ORAL | Status: DC

## 2012-11-07 MED ORDER — FEXOFENADINE HCL 180 MG PO TABS: 180.0000 mg | ORAL_TABLET | Freq: Every day | ORAL | Status: DC

## 2012-11-07 MED ORDER — IPRATROPIUM BROMIDE 0.06 % NA SOLN: 1.0000 | Freq: Three times a day (TID) | NASAL | Status: DC

## 2012-11-07 MED ORDER — AMLODIPINE BESYLATE 5 MG PO TABS: 5.0000 mg | ORAL_TABLET | Freq: Every day | ORAL | Status: DC

## 2012-11-07 MED ORDER — TRAMADOL HCL (ER BIPHASIC) 200 MG PO TB24: ORAL_TABLET | ORAL | Status: DC

## 2012-11-07 MED ORDER — ALBUTEROL SULFATE HFA 108 (90 BASE) MCG/ACT IN AERS: 2.0000 | INHALATION_SPRAY | Freq: Four times a day (QID) | RESPIRATORY_TRACT | Status: DC | PRN

## 2012-11-07 MED ORDER — OMEPRAZOLE 20 MG PO TBEC: 1.0000 | DELAYED_RELEASE_TABLET | Freq: Two times a day (BID) | ORAL | Status: DC

## 2012-11-07 MED ORDER — AZELASTINE HCL 0.1 % NA SOLN: 2.0000 | Freq: Every day | NASAL | Status: DC

## 2012-11-07 MED ORDER — ATORVASTATIN CALCIUM 40 MG PO TABS: 40.0000 mg | ORAL_TABLET | Freq: Every day | ORAL | Status: DC

## 2012-11-07 MED ORDER — GLUCOSE BLOOD VI STRP: ORAL_STRIP | Status: DC

## 2012-11-07 MED ORDER — FLUTICASONE PROPIONATE 50 MCG/ACT NA SUSP: 2.0000 | Freq: Every day | NASAL | Status: DC

## 2012-11-07 MED ORDER — PREDNISONE 10 MG PO TABS: ORAL_TABLET | ORAL | Status: DC

## 2012-11-07 MED ORDER — ONETOUCH DELICA LANCETS 33G MISC: 1.0000 | Freq: Every day | Status: DC

## 2012-11-07 MED ORDER — DOXAZOSIN MESYLATE 8 MG PO TABS: 8.0000 mg | ORAL_TABLET | Freq: Every day | ORAL | Status: DC

## 2012-11-07 NOTE — Assessment & Plan Note (Signed)

## 2012-11-07 NOTE — Addendum Note (Signed)
Addended by: Scharlene Gloss B on: 11/07/2012 04:47 PM   Modules accepted: Orders

## 2012-11-07 NOTE — Progress Notes (Signed)
Subjective:    Patient ID: Katherine [name], female    DOB: 08-21-1945, 67 y.o.   MRN: [id number]  HPI Here for wellness and f/u;  Overall doing ok;  Pt denies CP, worsening SOB, DOE, wheezing, orthopnea, PND, worsening LE edema, palpitations, dizziness or syncope.  Pt denies neurological change such as new headache, facial or extremity weakness.  Pt denies polydipsia, polyuria, or low sugar symptoms. Pt states overall good compliance with treatment and medications, good tolerability, and has been trying to follow lower cholesterol diet.  Pt denies worsening depressive symptoms, suicidal ideation or panic. No fever, night sweats, wt loss, loss of appetite, or other constitutional symptoms.  Pt states good ability with ADL's, has low fall risk, home safety reviewed and adequate, no other significant changes in hearing or vision, and only occasionally active with exercise.   Does have several wks ongoing nasal allergy symptoms with clearish congestion, itch and sneezing, without fever, pain, ST, cough, swelling or wheezing., all despite current tx at this time of the year.  Reminds me she has hx of thyroid nodule, asks for f/u test.  Conts to follow with [facility] locally for psychiatric. Past Medical History  Diagnosis Date  . ABDOMINAL PAIN, LOWER 02/04/2010  . ABNORMAL THYROID FUNCTION TESTS 08/04/2009  . ALLERGIC RHINITIS 09/05/2006  . ANXIETY 07/31/2007  . DEPRESSION 09/05/2006  . DIVERTICULOSIS, COLON 09/05/2006  . Dysuria 04/15/2010  . Failure to thrive in childhood 04/06/2010  . FATIGUE 04/15/2010  . FIBROMYALGIA 05/10/2009  . FRACTURE, PELVIS, RIGHT 04/15/2010  . GASTROENTERITIS, ACUTE 12/25/2008  . GERD 09/05/2006  . HYPERLIPIDEMIA 09/05/2006  . HYPERTENSION 09/05/2006  . KNEE PAIN, LEFT 02/04/2010  . OSTEOARTHRITIS 09/05/2006  . OSTEOPOROSIS 07/31/2007  . Other specified forms of hearing loss 11/01/2009  . SINUSITIS- ACUTE-NOS 10/09/2007  . SKIN LESION 05/10/2009  . SPRAIN&STRAIN OTH SPEC SITES SHOULDER&UPPER ARM  11/01/2009  . Unspecified hearing loss 10/09/2007  . URI 02/20/2007  . UTI 02/20/2007  . Blood transfusion   . Cataract   . Heart murmur   . DIABETES MELLITUS, TYPE II 07/31/2007    no meds   Past Surgical History  Procedure Laterality Date  . Tonsillectomy    . Breast biopsy    . Abdominal hysterectomy  12/08    Bladder tac, partial hysterectomy  . Inguinal herniorrhapy      right Dr. Freida Busman 06/2009  . Catarct removal      both eyes    reports that she has never smoked. She does not have any smokeless tobacco history on file. She reports that she does not drink alcohol or use illicit drugs. family history includes Coronary artery disease in her other; Diabetes in her other. There is no history of Colon cancer, Esophageal cancer, Stomach cancer, or Rectal cancer. Allergies  Allergen Reactions  . Penicillins     "made me nervous"  . Thioridazine Hcl Itching   Current Outpatient Prescriptions on File Prior to Visit  Medication Sig Dispense Refill  . albuterol (PROVENTIL HFA;VENTOLIN HFA) 108 (90 BASE) MCG/ACT inhaler Inhale 2 puffs into the lungs every 6 (six) hours as needed.  1 Inhaler  5  . amLODipine (NORVASC) 5 MG tablet Take 1 tablet by mouth every day  90 tablet  3  . aspirin 81 MG tablet Take 81 mg by mouth daily.        [name] atorvastatin (LIPITOR) 40 MG tablet Take 1 tablet (40 mg total) by mouth daily.  90 tablet  3  .  azelastine (ASTELIN) 137 MCG/SPRAY nasal spray Place 2 sprays into the nose daily. Use in each nostril as directed  30 mL  11  . benztropine (COGENTIN) 0.5 MG tablet Take 0.5 mg by mouth 2 (two) times daily.        . calcium-vitamin D (OSCAL WITH D) 500-200 MG-UNIT per tablet Take 1 tablet by mouth 3 (three) times daily. With meals  270 tablet  3  . cetirizine (ZYRTEC) 10 MG tablet Take 1 tablet (10 mg total) by mouth daily.  30 tablet  10  . doxazosin (CARDURA) 8 MG tablet Take 1 tablet (8 mg total) by mouth at bedtime.  30 tablet  11  . Electronic Thermometer  (DIGITAL THERMOMETER) MISC Use as directed       . fluticasone (FLONASE) 50 MCG/ACT nasal spray Place 2 sprays into the nose daily.  16 g  11  . glucose blood (ONE TOUCH ULTRA TEST) test strip Use as directed once daily to check blood sugar.  Diagnosis code 250.00      . glucose blood (ONE TOUCH ULTRA TEST) test strip Use 1 strip daily to check blood sugars. Dx: 250.00  100 each  11  . guaiFENesin (MUCINEX) 600 MG 12 hr tablet Take 1 tablet (600 mg total) by mouth 2 (two) times daily.  180 tablet  3  . ipratropium (ATROVENT HFA) 17 MCG/ACT inhaler Inhale 1 puff into the lungs 3 (three) times daily.        [name] ipratropium (ATROVENT) 0.06 % nasal spray Place 1 spray into the nose 3 (three) times daily.  15 mL  11  . methocarbamol (ROBAXIN) 500 MG tablet 1-2 by mouth every 6 hours for muscle spasms       . omeprazole (PRILOSEC) 20 MG capsule Take 1 capsule by mouth twice a day  60 capsule  11  . Omeprazole 20 MG TBEC Take 1 tablet (20 mg total) by mouth 2 (two) times daily.  60 each  10  . ONETOUCH DELICA LANCETS 33G MISC Inject 1 each as directed daily. Use as directed once daily to check blood sugar.  Diagnosis code 250.02.  100 each  11  . promethazine (PHENERGAN) 12.5 MG tablet Take 1 tablet (12.5 mg total) by mouth every 6 (six) hours as needed.  30 tablet  4  . risperiDONE (RISPERDAL) 3 MG tablet Take 3 mg by mouth daily.        . traMADol (ULTRAM-ER) 200 MG 24 hr tablet Take 1 tablet (200 mg total) by mouth daily as needed.  90 tablet  1  . TraMADol HCl 200 MG TB24 Take 1 tablet by mouth daily as needed  90 each  0  . fexofenadine (ALLEGRA) 180 MG tablet Take 1 tablet (180 mg total) by mouth daily.  30 tablet  5  . pregabalin (LYRICA) 100 MG capsule Take 1 capsule (100 mg total) by mouth 3 (three) times daily.  180 capsule  3   No current facility-administered medications on file prior to visit.   Review of Systems     Objective:   Physical Exam        Assessment & Plan:

## 2012-11-07 NOTE — Patient Instructions (Addendum)
Please take all new medication as prescribed - the prednisone short course (sent to [facility] on [address]) Robin to help with refill all meds to [facility] (90 days) You had the flu shot today, but please make appt for Nurse Visit in 2 wks for the Prevnar pneumonia shot Please remember to followup with your GYN for the yearly mammogram (and have done at [facility] on [address], or [facility] on [address]) Please continue all other medications as before, and refills have been done if requested. Please have the pharmacy call with any other refills you may need. You will be contacted regarding the referral for: thyroid ultrasound Please go to the LAB in the Basement (turn left off the elevator) for the tests to be done today You will be contacted by phone if any changes need to be made immediately.  Otherwise, you will receive a letter about your results with an explanation, but please check with MyChart first. You will be contacted regarding the referral for: neck ultrasound for the thyroid  Please remember to sign up for My Chart if you have not done so, as this will be important to you in the future with finding out test results, communicating by private email, and scheduling acute appointments online when needed.  Please return in 6 months, or sooner if needed

## 2012-11-11 ENCOUNTER — Ambulatory Visit: Payer: PRIVATE HEALTH INSURANCE | Admitting: Internal Medicine

## 2012-11-12 ENCOUNTER — Ambulatory Visit: Payer: PRIVATE HEALTH INSURANCE | Admitting: Internal Medicine

## 2012-11-12 ENCOUNTER — Other Ambulatory Visit: Payer: PRIVATE HEALTH INSURANCE

## 2012-11-18 ENCOUNTER — Ambulatory Visit
Admission: RE | Admit: 2012-11-18 | Discharge: 2012-11-18 | Disposition: A | Discharge status: Home / Self Care | Payer: PRIVATE HEALTH INSURANCE | Source: Ambulatory Visit

## 2012-11-18 DIAGNOSIS — Z1231 Encounter for screening mammogram for malignant neoplasm of breast: Secondary | ICD-10-CM

## 2012-11-21 ENCOUNTER — Ambulatory Visit: Payer: PRIVATE HEALTH INSURANCE

## 2012-11-26 ENCOUNTER — Encounter: Payer: Self-pay | Admitting: Internal Medicine

## 2012-11-26 ENCOUNTER — Ambulatory Visit
Admission: RE | Admit: 2012-11-26 | Discharge: 2012-11-26 | Disposition: A | Discharge status: Home / Self Care | Payer: PRIVATE HEALTH INSURANCE | Source: Ambulatory Visit | Attending: Internal Medicine | Admitting: Internal Medicine

## 2012-11-26 DIAGNOSIS — E041 Nontoxic single thyroid nodule: Secondary | ICD-10-CM

## 2012-12-04 ENCOUNTER — Ambulatory Visit (INDEPENDENT_AMBULATORY_CARE_PROVIDER_SITE_OTHER): Payer: PRIVATE HEALTH INSURANCE | Admitting: *Deleted

## 2012-12-04 DIAGNOSIS — Z23 Encounter for immunization: Secondary | ICD-10-CM

## 2012-12-10 ENCOUNTER — Other Ambulatory Visit (HOSPITAL_COMMUNITY): Payer: Self-pay | Admitting: Orthopaedic Surgery

## 2012-12-17 ENCOUNTER — Other Ambulatory Visit: Payer: Self-pay

## 2012-12-17 ENCOUNTER — Encounter (HOSPITAL_COMMUNITY): Payer: Self-pay | Admitting: Pharmacy Technician

## 2012-12-17 ENCOUNTER — Encounter (HOSPITAL_COMMUNITY)
Admission: RE | Admit: 2012-12-17 | Discharge: 2012-12-17 | Disposition: A | Discharge status: Home / Self Care | Payer: PRIVATE HEALTH INSURANCE | Source: Ambulatory Visit | Attending: Orthopaedic Surgery | Admitting: Orthopaedic Surgery

## 2012-12-17 ENCOUNTER — Encounter (HOSPITAL_COMMUNITY): Payer: Self-pay

## 2012-12-17 ENCOUNTER — Ambulatory Visit (HOSPITAL_COMMUNITY)
Admission: RE | Admit: 2012-12-17 | Discharge: 2012-12-17 | Disposition: A | Discharge status: Home / Self Care | Payer: PRIVATE HEALTH INSURANCE | Source: Ambulatory Visit | Attending: Orthopaedic Surgery | Admitting: Orthopaedic Surgery

## 2012-12-17 DIAGNOSIS — M1710 Unilateral primary osteoarthritis, unspecified knee: Secondary | ICD-10-CM | POA: Insufficient documentation

## 2012-12-17 DIAGNOSIS — R011 Cardiac murmur, unspecified: Secondary | ICD-10-CM | POA: Insufficient documentation

## 2012-12-17 DIAGNOSIS — M8448XA Pathological fracture, other site, initial encounter for fracture: Secondary | ICD-10-CM | POA: Insufficient documentation

## 2012-12-17 DIAGNOSIS — Z01812 Encounter for preprocedural laboratory examination: Secondary | ICD-10-CM | POA: Insufficient documentation

## 2012-12-17 DIAGNOSIS — Z01818 Encounter for other preprocedural examination: Secondary | ICD-10-CM | POA: Insufficient documentation

## 2012-12-17 DIAGNOSIS — Z01810 Encounter for preprocedural cardiovascular examination: Secondary | ICD-10-CM | POA: Insufficient documentation

## 2012-12-17 LAB — CBC
MCHC: 32.5 g/dL (ref 30.0–36.0)
Platelets: 345 10*3/uL (ref 150–400)
RDW: 13.6 % (ref 11.5–15.5)
WBC: 9.7 10*3/uL (ref 4.0–10.5)

## 2012-12-17 LAB — BASIC METABOLIC PANEL
BUN: 16 mg/dL (ref 6–23)
Creatinine, Ser: 0.55 mg/dL (ref 0.50–1.10)
GFR calc Af Amer: >90 mL/min (ref >90)
GFR calc non Af Amer: >90 mL/min (ref >90)

## 2012-12-17 LAB — URINALYSIS, ROUTINE W REFLEX MICROSCOPIC
Nitrite: NEGATIVE
Protein, ur: NEGATIVE mg/dL
Urobilinogen, UA: 0.2 mg/dL (ref 0.0–1.0)

## 2012-12-17 LAB — URINE MICROSCOPIC-ADD ON

## 2012-12-17 LAB — ABO/RH: ABO/RH(D): O POS

## 2012-12-17 LAB — PROTIME-INR: Prothrombin Time: 12.9 seconds (ref 11.6–15.2)

## 2012-12-17 LAB — APTT: aPTT: 27 seconds (ref 24–37)

## 2012-12-17 LAB — SURGICAL PCR SCREEN: Staphylococcus aureus: NEGATIVE

## 2012-12-17 NOTE — Pre-Procedure Instructions (Addendum)
12-17-12 Echo -4'14-Epic.EKG/CXR done today 12-17-12 1500 Dr. Council Mechanic reviewed EKG, attempted to get an old EKG for comparative-unsuccessful, none found. 12-17-12 1530 Urinalysis faxed to Dr. Eliberto Ivory office to note.

## 2012-12-17 NOTE — Patient Instructions (Addendum)
20 CORRINNE BENEGAS  12/17/2012   Your procedure is scheduled on: 11-21  -2014  Report to [facility] at     0700   AM .  Call this number if you have problems the morning of surgery: [phone]  Or Presurgical Testing [phone]([place])      Do not eat food:After Midnight.    Take these medicines the morning of surgery with A SIP OF WATER: Do not take Aspirin. Take Amlodipine. Atorvastatin. Cogentin. Doxazosin.Omeprazole. Lyrica. Bring/use nasal sprays-if not using nose ointment(then return to use of nasal sprays).   Do not wear jewelry, make-up or nail polish.  Do not wear lotions, powders, or perfumes. You may wear deodorant.  Do not shave 12 hours prior to first CHG shower(legs and under arms).(face and neck okay.)  Do not bring valuables to the hospital.  Contacts, dentures or removable bridgework, body piercing, hair pins may not be worn into surgery.  Leave suitcase in the car. After surgery it may be brought to your room.  For patients admitted to the hospital, checkout time is 11:00 AM the day of discharge.   Patients discharged the day of surgery will not be allowed to drive home. Must have responsible person with you x 24 hours once discharged.  Name and phone number of your driver: Colin Mulders- [phone], Sharol Roussel [phone]  Special Instructions: CHG(Chlorhedine 4%-"Hibiclens","Betasept","Aplicare") Shower Use Special Wash: see special instructions.(avoid face and genitals)   Please read over the following fact sheets that you were given: MRSA Information, Blood Transfusion fact sheet, Incentive Spirometry Instruction.  Remember : Type/Screen "Blue armbands" - may not be removed once applied(would result in being retested if removed).  Failure to follow these instructions may result in Cancellation of your surgery.   Patient signature_______________________________________________________

## 2012-12-17 NOTE — Progress Notes (Signed)
12-17-12 1530 labs viewable in Epic-note urinalysis.

## 2012-12-18 LAB — URINE CULTURE: Colony Count: 50000

## 2012-12-18 NOTE — Pre-Procedure Instructions (Signed)
12-18-12 1545 Pt. and friend- Danella Sensing made aware of surgery time changed to 0730 AM, to arrive at 0530 AM to Short Stay.W. Kennon Portela

## 2012-12-20 ENCOUNTER — Encounter (HOSPITAL_COMMUNITY)
Admission: RE | Disposition: A | Discharge status: Skilled Nursing Facility | Payer: Self-pay | Source: Ambulatory Visit | Attending: Orthopaedic Surgery

## 2012-12-20 ENCOUNTER — Inpatient Hospital Stay (HOSPITAL_COMMUNITY): Payer: PRIVATE HEALTH INSURANCE

## 2012-12-20 ENCOUNTER — Encounter (HOSPITAL_COMMUNITY): Payer: PRIVATE HEALTH INSURANCE | Admitting: Anesthesiology

## 2012-12-20 ENCOUNTER — Encounter (HOSPITAL_COMMUNITY): Payer: Self-pay | Admitting: *Deleted

## 2012-12-20 ENCOUNTER — Inpatient Hospital Stay (HOSPITAL_COMMUNITY)
Admission: RE | Admit: 2012-12-20 | Discharge: 2012-12-23 | DRG: 470 | Disposition: A | Discharge status: Skilled Nursing Facility | Payer: PRIVATE HEALTH INSURANCE | Source: Ambulatory Visit | Attending: Orthopaedic Surgery | Admitting: Orthopaedic Surgery

## 2012-12-20 ENCOUNTER — Inpatient Hospital Stay (HOSPITAL_COMMUNITY): Payer: PRIVATE HEALTH INSURANCE | Admitting: Anesthesiology

## 2012-12-20 DIAGNOSIS — E041 Nontoxic single thyroid nodule: Secondary | ICD-10-CM | POA: Diagnosis present

## 2012-12-20 DIAGNOSIS — F3289 Other specified depressive episodes: Secondary | ICD-10-CM | POA: Diagnosis present

## 2012-12-20 DIAGNOSIS — I10 Essential (primary) hypertension: Secondary | ICD-10-CM | POA: Diagnosis present

## 2012-12-20 DIAGNOSIS — Z79899 Other long term (current) drug therapy: Secondary | ICD-10-CM

## 2012-12-20 DIAGNOSIS — E785 Hyperlipidemia, unspecified: Secondary | ICD-10-CM | POA: Diagnosis present

## 2012-12-20 DIAGNOSIS — M1712 Unilateral primary osteoarthritis, left knee: Secondary | ICD-10-CM

## 2012-12-20 DIAGNOSIS — D62 Acute posthemorrhagic anemia: Secondary | ICD-10-CM | POA: Diagnosis not present

## 2012-12-20 DIAGNOSIS — I359 Nonrheumatic aortic valve disorder, unspecified: Secondary | ICD-10-CM | POA: Diagnosis present

## 2012-12-20 DIAGNOSIS — IMO0001 Reserved for inherently not codable concepts without codable children: Secondary | ICD-10-CM | POA: Diagnosis present

## 2012-12-20 DIAGNOSIS — K219 Gastro-esophageal reflux disease without esophagitis: Secondary | ICD-10-CM | POA: Diagnosis present

## 2012-12-20 DIAGNOSIS — F411 Generalized anxiety disorder: Secondary | ICD-10-CM | POA: Diagnosis present

## 2012-12-20 DIAGNOSIS — F329 Major depressive disorder, single episode, unspecified: Secondary | ICD-10-CM | POA: Diagnosis present

## 2012-12-20 DIAGNOSIS — M810 Age-related osteoporosis without current pathological fracture: Secondary | ICD-10-CM | POA: Diagnosis present

## 2012-12-20 DIAGNOSIS — E119 Type 2 diabetes mellitus without complications: Secondary | ICD-10-CM | POA: Diagnosis present

## 2012-12-20 DIAGNOSIS — R011 Cardiac murmur, unspecified: Secondary | ICD-10-CM | POA: Diagnosis present

## 2012-12-20 DIAGNOSIS — Z833 Family history of diabetes mellitus: Secondary | ICD-10-CM

## 2012-12-20 DIAGNOSIS — M1710 Unilateral primary osteoarthritis, unspecified knee: Principal | ICD-10-CM | POA: Diagnosis present

## 2012-12-20 DIAGNOSIS — H9190 Unspecified hearing loss, unspecified ear: Secondary | ICD-10-CM | POA: Diagnosis present

## 2012-12-20 DIAGNOSIS — R42 Dizziness and giddiness: Secondary | ICD-10-CM | POA: Diagnosis not present

## 2012-12-20 HISTORY — PX: TOTAL KNEE ARTHROPLASTY: SHX125

## 2012-12-20 LAB — GLUCOSE, CAPILLARY: Glucose-Capillary: 96 mg/dL (ref 70–99)

## 2012-12-20 SURGERY — ARTHROPLASTY, KNEE, TOTAL
Anesthesia: General | Site: Knee | Laterality: Left | Wound class: Clean

## 2012-12-20 MED ORDER — PROPOFOL INFUSION 10 MG/ML OPTIME
INTRAVENOUS | Status: DC | PRN
  Administered 2012-12-20: 75 ug/kg/min via INTRAVENOUS

## 2012-12-20 MED ORDER — CLINDAMYCIN PHOSPHATE 600 MG/50ML IV SOLN
600.0000 mg | Freq: Four times a day (QID) | INTRAVENOUS | Status: AC
  Administered 2012-12-20 (×2): 600 mg via INTRAVENOUS
  Filled 2012-12-20 (×2): qty 50

## 2012-12-20 MED ORDER — HYDROMORPHONE HCL PF 1 MG/ML IJ SOLN
1.0000 mg | INTRAMUSCULAR | Status: DC | PRN
  Administered 2012-12-20: 13:00:00 0.5 mg via INTRAVENOUS
  Administered 2012-12-21: 10:00:00 1 mg via INTRAVENOUS
  Filled 2012-12-20 (×2): qty 1

## 2012-12-20 MED ORDER — PROPOFOL 10 MG/ML IV BOLUS
INTRAVENOUS | Status: AC
  Filled 2012-12-20: qty 20

## 2012-12-20 MED ORDER — STERILE WATER FOR IRRIGATION IR SOLN
Status: DC | PRN
  Administered 2012-12-20: 3000 mL

## 2012-12-20 MED ORDER — SODIUM CHLORIDE 0.9 % IV SOLN
INTRAVENOUS | Status: DC
  Administered 2012-12-20: 13:00:00 via INTRAVENOUS

## 2012-12-20 MED ORDER — RISPERIDONE 3 MG PO TABS
3.0000 mg | ORAL_TABLET | Freq: Every day | ORAL | Status: DC
  Administered 2012-12-20 – 2012-12-22 (×3): 3 mg via ORAL
  Filled 2012-12-20 (×4): qty 1

## 2012-12-20 MED ORDER — DEXAMETHASONE SODIUM PHOSPHATE 10 MG/ML IJ SOLN
INTRAMUSCULAR | Status: DC | PRN
  Administered 2012-12-20: 10 mg via INTRAVENOUS

## 2012-12-20 MED ORDER — ADULT MULTIVITAMIN W/MINERALS CH
1.0000 | ORAL_TABLET | Freq: Every day | ORAL | Status: DC
  Administered 2012-12-21 – 2012-12-23 (×3): 1 via ORAL
  Filled 2012-12-20 (×3): qty 1

## 2012-12-20 MED ORDER — ACETAMINOPHEN 650 MG RE SUPP: 650.0000 mg | Freq: Four times a day (QID) | RECTAL | Status: DC | PRN

## 2012-12-20 MED ORDER — ONDANSETRON HCL 4 MG/2ML IJ SOLN
4.0000 mg | Freq: Four times a day (QID) | INTRAMUSCULAR | Status: DC | PRN
  Administered 2012-12-20 – 2012-12-21 (×3): 4 mg via INTRAVENOUS
  Filled 2012-12-20 (×3): qty 2

## 2012-12-20 MED ORDER — METHOCARBAMOL 500 MG PO TABS
500.0000 mg | ORAL_TABLET | Freq: Four times a day (QID) | ORAL | Status: DC | PRN
  Administered 2012-12-22 – 2012-12-23 (×4): 500 mg via ORAL
  Filled 2012-12-20 (×4): qty 1

## 2012-12-20 MED ORDER — CLINDAMYCIN PHOSPHATE 900 MG/50ML IV SOLN
900.0000 mg | INTRAVENOUS | Status: AC
  Administered 2012-12-20: 900 mg via INTRAVENOUS

## 2012-12-20 MED ORDER — LORATADINE 10 MG PO TABS
10.0000 mg | ORAL_TABLET | Freq: Every day | ORAL | Status: DC
  Administered 2012-12-20 – 2012-12-23 (×4): 10 mg via ORAL
  Filled 2012-12-20 (×4): qty 1

## 2012-12-20 MED ORDER — SODIUM CHLORIDE 0.9 % IJ SOLN
INTRAMUSCULAR | Status: AC
  Filled 2012-12-20: qty 50

## 2012-12-20 MED ORDER — PROMETHAZINE HCL 25 MG/ML IJ SOLN: 6.2500 mg | INTRAMUSCULAR | Status: DC | PRN

## 2012-12-20 MED ORDER — FENTANYL CITRATE 0.05 MG/ML IJ SOLN
INTRAMUSCULAR | Status: AC
  Filled 2012-12-20: qty 5

## 2012-12-20 MED ORDER — BUPIVACAINE IN DEXTROSE 0.75-8.25 % IT SOLN
INTRATHECAL | Status: DC | PRN
  Administered 2012-12-20: 1.6 mL via INTRATHECAL

## 2012-12-20 MED ORDER — MENTHOL 3 MG MT LOZG: 1.0000 | LOZENGE | OROMUCOSAL | Status: DC | PRN

## 2012-12-20 MED ORDER — ATORVASTATIN CALCIUM 40 MG PO TABS
40.0000 mg | ORAL_TABLET | Freq: Every morning | ORAL | Status: DC
  Administered 2012-12-21 – 2012-12-23 (×3): 40 mg via ORAL
  Filled 2012-12-20 (×3): qty 1

## 2012-12-20 MED ORDER — ALUM & MAG HYDROXIDE-SIMETH 200-200-20 MG/5ML PO SUSP: 30.0000 mL | ORAL | Status: DC | PRN

## 2012-12-20 MED ORDER — DIPHENHYDRAMINE HCL 12.5 MG/5ML PO ELIX: 12.5000 mg | ORAL_SOLUTION | ORAL | Status: DC | PRN

## 2012-12-20 MED ORDER — AZELASTINE HCL 0.1 % NA SOLN
1.0000 | Freq: Two times a day (BID) | NASAL | Status: DC
  Administered 2012-12-20 – 2012-12-23 (×6): 1 via NASAL
  Filled 2012-12-20: qty 30

## 2012-12-20 MED ORDER — ASPIRIN EC 325 MG PO TBEC
325.0000 mg | DELAYED_RELEASE_TABLET | Freq: Two times a day (BID) | ORAL | Status: DC
  Administered 2012-12-20 – 2012-12-23 (×6): 325 mg via ORAL
  Filled 2012-12-20 (×8): qty 1

## 2012-12-20 MED ORDER — SODIUM CHLORIDE 0.9 % IR SOLN
Status: DC | PRN
  Administered 2012-12-20 (×2): 1000 mL

## 2012-12-20 MED ORDER — PANTOPRAZOLE SODIUM 40 MG PO TBEC
40.0000 mg | DELAYED_RELEASE_TABLET | Freq: Every day | ORAL | Status: DC
  Administered 2012-12-21 – 2012-12-23 (×3): 40 mg via ORAL
  Filled 2012-12-20 (×3): qty 1

## 2012-12-20 MED ORDER — ROCURONIUM BROMIDE 100 MG/10ML IV SOLN
INTRAVENOUS | Status: AC
  Filled 2012-12-20: qty 1

## 2012-12-20 MED ORDER — HYDROMORPHONE HCL PF 1 MG/ML IJ SOLN
INTRAMUSCULAR | Status: AC
  Filled 2012-12-20: qty 1

## 2012-12-20 MED ORDER — DEXAMETHASONE SODIUM PHOSPHATE 10 MG/ML IJ SOLN
INTRAMUSCULAR | Status: AC
  Filled 2012-12-20: qty 1

## 2012-12-20 MED ORDER — KETOROLAC TROMETHAMINE 30 MG/ML IJ SOLN: 15.0000 mg | Freq: Once | INTRAMUSCULAR | Status: DC | PRN

## 2012-12-20 MED ORDER — MIDAZOLAM HCL 5 MG/5ML IJ SOLN
INTRAMUSCULAR | Status: DC | PRN
  Administered 2012-12-20 (×2): 0.5 mg via INTRAVENOUS
  Administered 2012-12-20: 1 mg via INTRAVENOUS

## 2012-12-20 MED ORDER — PREGABALIN 75 MG PO CAPS
75.0000 mg | ORAL_CAPSULE | Freq: Two times a day (BID) | ORAL | Status: DC
  Administered 2012-12-20 – 2012-12-23 (×6): 75 mg via ORAL
  Filled 2012-12-20 (×6): qty 1

## 2012-12-20 MED ORDER — METHOCARBAMOL 100 MG/ML IJ SOLN
500.0000 mg | Freq: Four times a day (QID) | INTRAVENOUS | Status: DC | PRN
  Filled 2012-12-20: qty 5

## 2012-12-20 MED ORDER — LACTATED RINGERS IV SOLN
INTRAVENOUS | Status: DC | PRN
  Administered 2012-12-20 (×3): via INTRAVENOUS

## 2012-12-20 MED ORDER — ACETAMINOPHEN 325 MG PO TABS: 650.0000 mg | ORAL_TABLET | Freq: Four times a day (QID) | ORAL | Status: DC | PRN

## 2012-12-20 MED ORDER — MIDAZOLAM HCL 2 MG/2ML IJ SOLN
INTRAMUSCULAR | Status: AC
  Filled 2012-12-20: qty 2

## 2012-12-20 MED ORDER — ONDANSETRON HCL 4 MG PO TABS: 4.0000 mg | ORAL_TABLET | Freq: Four times a day (QID) | ORAL | Status: DC | PRN

## 2012-12-20 MED ORDER — ONDANSETRON HCL 4 MG/2ML IJ SOLN
INTRAMUSCULAR | Status: DC | PRN
  Administered 2012-12-20: 4 mg via INTRAVENOUS

## 2012-12-20 MED ORDER — 0.9 % SODIUM CHLORIDE (POUR BTL) OPTIME
TOPICAL | Status: DC | PRN
  Administered 2012-12-20: 1000 mL

## 2012-12-20 MED ORDER — HYDROMORPHONE HCL PF 1 MG/ML IJ SOLN
0.2500 mg | INTRAMUSCULAR | Status: DC | PRN
  Administered 2012-12-20: 0.25 mg via INTRAVENOUS

## 2012-12-20 MED ORDER — DOXAZOSIN MESYLATE 8 MG PO TABS
8.0000 mg | ORAL_TABLET | Freq: Every morning | ORAL | Status: DC
  Administered 2012-12-20 – 2012-12-23 (×4): 8 mg via ORAL
  Filled 2012-12-20 (×4): qty 1

## 2012-12-20 MED ORDER — PROPOFOL 10 MG/ML IV BOLUS
INTRAVENOUS | Status: DC | PRN
  Administered 2012-12-20: 20 mg via INTRAVENOUS

## 2012-12-20 MED ORDER — ONDANSETRON HCL 4 MG/2ML IJ SOLN
INTRAMUSCULAR | Status: AC
  Filled 2012-12-20: qty 2

## 2012-12-20 MED ORDER — PHENOL 1.4 % MT LIQD: 1.0000 | OROMUCOSAL | Status: DC | PRN

## 2012-12-20 MED ORDER — BENZTROPINE MESYLATE 0.5 MG PO TABS
0.5000 mg | ORAL_TABLET | Freq: Two times a day (BID) | ORAL | Status: DC
  Administered 2012-12-20 – 2012-12-23 (×6): 0.5 mg via ORAL
  Filled 2012-12-20 (×7): qty 1

## 2012-12-20 MED ORDER — SUCCINYLCHOLINE CHLORIDE 20 MG/ML IJ SOLN
INTRAMUSCULAR | Status: AC
  Filled 2012-12-20: qty 1

## 2012-12-20 MED ORDER — CLINDAMYCIN PHOSPHATE 900 MG/50ML IV SOLN
INTRAVENOUS | Status: AC
  Filled 2012-12-20: qty 50

## 2012-12-20 MED ORDER — IPRATROPIUM BROMIDE 0.03 % NA SOLN
2.0000 | Freq: Three times a day (TID) | NASAL | Status: DC
  Administered 2012-12-21 – 2012-12-23 (×8): 2 via NASAL
  Filled 2012-12-20: qty 30

## 2012-12-20 MED ORDER — ZOLPIDEM TARTRATE 5 MG PO TABS: 5.0000 mg | ORAL_TABLET | Freq: Every evening | ORAL | Status: DC | PRN

## 2012-12-20 MED ORDER — METOCLOPRAMIDE HCL 5 MG/ML IJ SOLN
5.0000 mg | Freq: Three times a day (TID) | INTRAMUSCULAR | Status: DC | PRN
  Administered 2012-12-20: 16:00:00 10 mg via INTRAVENOUS
  Filled 2012-12-20: qty 2

## 2012-12-20 MED ORDER — FLUTICASONE PROPIONATE 50 MCG/ACT NA SUSP
2.0000 | Freq: Every day | NASAL | Status: DC
  Administered 2012-12-20 – 2012-12-22 (×3): 2 via NASAL
  Filled 2012-12-20: qty 16

## 2012-12-20 MED ORDER — SODIUM CHLORIDE 0.9 % IJ SOLN
INTRAMUSCULAR | Status: DC | PRN
  Administered 2012-12-20: 40 mL

## 2012-12-20 MED ORDER — IPRATROPIUM BROMIDE 0.06 % NA SOLN
1.0000 | Freq: Three times a day (TID) | NASAL | Status: DC
  Filled 2012-12-20: qty 15

## 2012-12-20 MED ORDER — METOCLOPRAMIDE HCL 10 MG PO TABS: 5.0000 mg | ORAL_TABLET | Freq: Three times a day (TID) | ORAL | Status: DC | PRN

## 2012-12-20 MED ORDER — DOCUSATE SODIUM 100 MG PO CAPS
100.0000 mg | ORAL_CAPSULE | Freq: Two times a day (BID) | ORAL | Status: DC
  Administered 2012-12-20 – 2012-12-23 (×6): 100 mg via ORAL

## 2012-12-20 MED ORDER — POLYETHYLENE GLYCOL 3350 17 G PO PACK: 17.0000 g | PACK | Freq: Every day | ORAL | Status: DC | PRN

## 2012-12-20 MED ORDER — BUPIVACAINE LIPOSOME 1.3 % IJ SUSP
20.0000 mL | Freq: Once | INTRAMUSCULAR | Status: AC
  Administered 2012-12-20: 20 mL
  Filled 2012-12-20: qty 20

## 2012-12-20 MED ORDER — FENTANYL CITRATE 0.05 MG/ML IJ SOLN: 25.0000 ug | INTRAMUSCULAR | Status: DC | PRN

## 2012-12-20 MED ORDER — OXYCODONE HCL 5 MG PO TABS
5.0000 mg | ORAL_TABLET | ORAL | Status: DC | PRN
  Administered 2012-12-20: 10 mg via ORAL
  Administered 2012-12-20: 13:00:00 5 mg via ORAL
  Administered 2012-12-20 – 2012-12-23 (×12): 10 mg via ORAL
  Filled 2012-12-20: qty 2
  Filled 2012-12-20: qty 1
  Filled 2012-12-20 (×12): qty 2

## 2012-12-20 MED ORDER — AMLODIPINE BESYLATE 5 MG PO TABS
5.0000 mg | ORAL_TABLET | Freq: Every morning | ORAL | Status: DC
  Administered 2012-12-21 – 2012-12-23 (×3): 5 mg via ORAL
  Filled 2012-12-20 (×3): qty 1

## 2012-12-20 SURGICAL SUPPLY — 63 items
ADH SKN CLS APL DERMABOND .7 (GAUZE/BANDAGES/DRESSINGS) ×1
BAG SPEC THK2 15X12 ZIP CLS (MISCELLANEOUS) ×1
BAG ZIPLOCK 12X15 (MISCELLANEOUS) ×2 IMPLANT
BANDAGE ELASTIC 6 VELCRO ST LF (GAUZE/BANDAGES/DRESSINGS) ×4 IMPLANT
BANDAGE ESMARK 6X9 LF (GAUZE/BANDAGES/DRESSINGS) ×1 IMPLANT
BLADE SAG 18X100X1.27 (BLADE) ×2 IMPLANT
BNDG CMPR 9X6 STRL LF SNTH (GAUZE/BANDAGES/DRESSINGS) ×1
BNDG ESMARK 6X9 LF (GAUZE/BANDAGES/DRESSINGS) ×2
BOWL SMART MIX CTS (DISPOSABLE) ×2 IMPLANT
CEMENT BONE 1-PACK (Cement) ×4 IMPLANT
CUFF TOURN SGL QUICK 34 (TOURNIQUET CUFF) ×2
CUFF TRNQT CYL 34X4X40X1 (TOURNIQUET CUFF) ×1 IMPLANT
DERMABOND ADVANCED (GAUZE/BANDAGES/DRESSINGS) ×1
DERMABOND ADVANCED .7 DNX12 (GAUZE/BANDAGES/DRESSINGS) IMPLANT
DRAPE EXTREMITY T 121X128X90 (DRAPE) ×2 IMPLANT
DRAPE LG THREE QUARTER DISP (DRAPES) IMPLANT
DRAPE POUCH INSTRU U-SHP 10X18 (DRAPES) ×2 IMPLANT
DRAPE U-SHAPE 47X51 STRL (DRAPES) ×2 IMPLANT
DRSG AQUACEL AG ADV 3.5X10 (GAUZE/BANDAGES/DRESSINGS) ×2 IMPLANT
DRSG PAD ABDOMINAL 8X10 ST (GAUZE/BANDAGES/DRESSINGS) IMPLANT
DRSG TEGADERM 4X4.75 (GAUZE/BANDAGES/DRESSINGS) ×2 IMPLANT
DURAPREP 26ML APPLICATOR (WOUND CARE) ×4 IMPLANT
ELECT REM PT RETURN 9FT ADLT (ELECTROSURGICAL) ×2
ELECTRODE REM PT RTRN 9FT ADLT (ELECTROSURGICAL) ×1 IMPLANT
EVACUATOR 1/8 PVC DRAIN (DRAIN) ×2 IMPLANT
FACESHIELD LNG OPTICON STERILE (SAFETY) ×10 IMPLANT
GAUZE SPONGE 2X2 8PLY STRL LF (GAUZE/BANDAGES/DRESSINGS) ×1 IMPLANT
GAUZE XEROFORM 1X8 LF (GAUZE/BANDAGES/DRESSINGS) IMPLANT
GLOVE BIO SURGEON STRL SZ7.5 (GLOVE) ×2 IMPLANT
GLOVE BIOGEL PI IND STRL 8 (GLOVE) ×2 IMPLANT
GLOVE BIOGEL PI INDICATOR 8 (GLOVE) ×2
GLOVE ECLIPSE 8.0 STRL XLNG CF (GLOVE) ×2 IMPLANT
GOWN STRL REIN XL XLG (GOWN DISPOSABLE) ×4 IMPLANT
HANDPIECE INTERPULSE COAX TIP (DISPOSABLE) ×2
IMMOBILIZER KNEE 20 (SOFTGOODS) ×2
IMMOBILIZER KNEE 20 THIGH 36 (SOFTGOODS) ×1 IMPLANT
KIT BASIN OR (CUSTOM PROCEDURE TRAY) ×2 IMPLANT
KNEE/VIT E POLY LINER LEVEL 1B ×1 IMPLANT
NDL HYPO 21X1.5 SAFETY (NEEDLE) ×1 IMPLANT
NEEDLE HYPO 21X1.5 SAFETY (NEEDLE) ×2 IMPLANT
NS IRRIG 1000ML POUR BTL (IV SOLUTION) ×2 IMPLANT
PACK TOTAL JOINT (CUSTOM PROCEDURE TRAY) ×2 IMPLANT
PADDING CAST COTTON 6X4 STRL (CAST SUPPLIES) ×4 IMPLANT
POSITIONER SURGICAL ARM (MISCELLANEOUS) ×2 IMPLANT
SET HNDPC FAN SPRY TIP SCT (DISPOSABLE) ×1 IMPLANT
SET PAD KNEE POSITIONER (MISCELLANEOUS) ×2 IMPLANT
SPONGE GAUZE 2X2 STER 10/PKG (GAUZE/BANDAGES/DRESSINGS) ×1
SPONGE GAUZE 4X4 12PLY (GAUZE/BANDAGES/DRESSINGS) IMPLANT
STAPLER VISISTAT 35W (STAPLE) IMPLANT
SUCTION FRAZIER 12FR DISP (SUCTIONS) ×2 IMPLANT
SUT MNCRL AB 4-0 PS2 18 (SUTURE) ×1 IMPLANT
SUT VIC AB 0 CT1 27 (SUTURE)
SUT VIC AB 0 CT1 27XBRD ANTBC (SUTURE) IMPLANT
SUT VIC AB 1 CT1 27 (SUTURE) ×6
SUT VIC AB 1 CT1 27XBRD ANTBC (SUTURE) ×3 IMPLANT
SUT VIC AB 2-0 CT1 27 (SUTURE) ×4
SUT VIC AB 2-0 CT1 TAPERPNT 27 (SUTURE) ×2 IMPLANT
SYR 50ML LL SCALE MARK (SYRINGE) ×2 IMPLANT
TOWEL OR 17X26 10 PK STRL BLUE (TOWEL DISPOSABLE) ×2 IMPLANT
TOWEL OR NON WOVEN STRL DISP B (DISPOSABLE) ×2 IMPLANT
TRAY FOLEY CATH 14FRSI W/METER (CATHETERS) ×2 IMPLANT
WATER STERILE IRR 1500ML POUR (IV SOLUTION) ×4 IMPLANT
WRAP KNEE MAXI GEL POST OP (GAUZE/BANDAGES/DRESSINGS) ×2 IMPLANT

## 2012-12-20 NOTE — Preoperative (Signed)
Beta Blockers   Reason not to administer Beta Blockers:Not Applicable 

## 2012-12-20 NOTE — H&P (Signed)
TOTAL KNEE ADMISSION H&P  Patient is being admitted for left total knee arthroplasty.  Subjective:  Chief Complaint:left knee pain.  HPI: Katherine [name], 67 y.o. female, has a history of pain and functional disability in the left knee due to arthritis and has failed non-surgical conservative treatments for greater than 12 weeks to includeNSAID's and/or analgesics, corticosteriod injections, use of assistive devices and activity modification.  Onset of symptoms was gradual, starting 3 years ago with gradually worsening course since that time. The patient noted no past surgery on the left knee(s).  Patient currently rates pain in the left knee(s) at 8 out of 10 with activity. Patient has night pain, worsening of pain with activity and weight bearing, pain that interferes with activities of daily living, pain with passive range of motion and joint swelling.  Patient has evidence of subchondral cysts, subchondral sclerosis, periarticular osteophytes, joint space narrowing and severe valgus deformity by imaging studies. There is no active infection.  Patient Active Problem List   Diagnosis Date Noted  . Arthritis of knee, left 12/20/2012  . Thyroid nodule 11/07/2012  . Type II or unspecified type diabetes mellitus without mention of complication, uncontrolled 11/07/2012  . Aortic stenosis, mild 05/27/2012  . Heart murmur, systolic 05/09/2012  . Preventative health care 10/07/2010  . FATIGUE 04/15/2010  . Dysuria 04/15/2010  . FRACTURE, PELVIS, RIGHT 04/15/2010  . Failure to thrive 04/06/2010  . KNEE PAIN, LEFT 02/04/2010  . SPRAIN&STRAIN OTH SPEC SITES SHOULDER&UPPER ARM 11/01/2009  . ABNORMAL THYROID FUNCTION TESTS 08/04/2009  . SKIN LESION 05/10/2009  . FIBROMYALGIA 05/10/2009  . DIABETES MELLITUS, TYPE II 07/31/2007  . ANXIETY 07/31/2007  . OSTEOPOROSIS 07/31/2007  . HYPERLIPIDEMIA 09/05/2006  . DEPRESSION 09/05/2006  . HYPERTENSION 09/05/2006  . ALLERGIC RHINITIS 09/05/2006  .  GERD 09/05/2006  . DIVERTICULOSIS, COLON 09/05/2006  . OSTEOARTHRITIS 09/05/2006   Past Medical History  Diagnosis Date  . ABDOMINAL PAIN, LOWER 02/04/2010  . ABNORMAL THYROID FUNCTION TESTS 08/04/2009  . ALLERGIC RHINITIS 09/05/2006  . ANXIETY 07/31/2007  . DEPRESSION 09/05/2006  . DIVERTICULOSIS, COLON 09/05/2006  . Dysuria 04/15/2010  . Failure to thrive in childhood 04/06/2010  . FATIGUE 04/15/2010  . FIBROMYALGIA 05/10/2009  . FRACTURE, PELVIS, RIGHT 04/15/2010  . GASTROENTERITIS, ACUTE 12/25/2008  . GERD 09/05/2006  . HYPERLIPIDEMIA 09/05/2006  . HYPERTENSION 09/05/2006  . KNEE PAIN, LEFT 02/04/2010  . OSTEOARTHRITIS 09/05/2006  . OSTEOPOROSIS 07/31/2007  . Other specified forms of hearing loss 11/01/2009  . SINUSITIS- ACUTE-NOS 10/09/2007  . SKIN LESION 05/10/2009  . SPRAIN&STRAIN OTH SPEC SITES SHOULDER&UPPER ARM 11/01/2009  . Unspecified hearing loss 10/09/2007  . URI 02/20/2007  . UTI 02/20/2007  . Blood transfusion   . Cataract     resolved -surgery done  . Heart murmur   . DIABETES MELLITUS, TYPE II 07/31/2007    no meds    Past Surgical History  Procedure Laterality Date  . Tonsillectomy    . Breast biopsy    . Abdominal hysterectomy  12/08    Bladder tac, partial hysterectomy  . Inguinal herniorrhapy      right Dr. Freida Busman 06/2009  . Catarct removal      both eyes    Prescriptions prior to admission  Medication Sig Dispense Refill  . amLODipine (NORVASC) 5 MG tablet Take 5 mg by mouth every morning.      [name] atorvastatin (LIPITOR) 40 MG tablet Take 40 mg by mouth every morning.      [name] azelastine (ASTELIN) 137 MCG/SPRAY  nasal spray Place 1 spray into both nostrils 2 (two) times daily. Use in each nostril as directed      . benztropine (COGENTIN) 0.5 MG tablet Take 0.5 mg by mouth 2 (two) times daily.        . cetirizine (ZYRTEC) 10 MG tablet Take 10 mg by mouth daily.      [name] doxazosin (CARDURA) 8 MG tablet Take 8 mg by mouth every morning.      . etodolac (LODINE) 400 MG tablet Take 400 mg by  mouth 2 (two) times daily.      . fluticasone (FLONASE) 50 MCG/ACT nasal spray Place 2 sprays into both nostrils daily.      [name] ipratropium (ATROVENT) 0.06 % nasal spray Place 1 spray into both nostrils 3 (three) times daily.      . methocarbamol (ROBAXIN) 500 MG tablet Take 500 mg by mouth 2 (two) times daily.      . mometasone (NASONEX) 50 MCG/ACT nasal spray Place 2 sprays into the nose daily.      . Multiple Vitamin (MULTIVITAMIN WITH MINERALS) TABS tablet Take 1 tablet by mouth daily.      [name] omeprazole (PRILOSEC) 20 MG capsule Take 20 mg by mouth daily.      . pregabalin (LYRICA) 75 MG capsule Take 75 mg by mouth 2 (two) times daily.      . promethazine (PHENERGAN) 12.5 MG tablet Take 12.5 mg by mouth every 6 (six) hours as needed for nausea or vomiting.      . risperiDONE (RISPERDAL) 3 MG tablet Take 3 mg by mouth at bedtime.       [name] tiZANidine (ZANAFLEX) 4 MG tablet Take 4 mg by mouth at bedtime.      . traMADol (ULTRAM-ER) 200 MG 24 hr tablet Take 200 mg by mouth at bedtime.      [name] aspirin 81 MG tablet Take 81 mg by mouth daily.         Allergies  Allergen Reactions  . Penicillins     "made me nervous"  . Thioridazine Hcl Itching    History  Substance Use Topics  . Smoking status: Never Smoker   . Smokeless tobacco: Not on file  . Alcohol Use: No    Family History  Problem Relation Age of Onset  . Coronary artery disease Other   . Diabetes Other   . Colon cancer Neg Hx   . Esophageal cancer Neg Hx   . Stomach cancer Neg Hx   . Rectal cancer Neg Hx      Review of Systems  All other systems reviewed and are negative.    Objective:  Physical Exam  Constitutional: She is oriented to person, place, and time. She appears well-developed and well-nourished.  HENT:  Head: Normocephalic and atraumatic.  Eyes: EOM are normal. Pupils are equal, round, and reactive to light.  Neck: Normal range of motion. Neck supple.  Cardiovascular: Normal rate and regular rhythm.    Respiratory: Effort normal and breath sounds normal.  GI: Soft. Bowel sounds are normal.  Musculoskeletal:       Left knee: She exhibits decreased range of motion, effusion and abnormal alignment. Tenderness found. Medial joint line and lateral joint line tenderness noted.  Neurological: She is alert and oriented to person, place, and time.  Skin: Skin is warm and dry.  Psychiatric: She has a normal mood and affect.    Vital signs in last 24 hours: Temp:  [97.5 F (36.4 C)] 97.5  F (36.4 C) (11/21 0528) Pulse Rate:  [100] 100 (11/21 0528) Resp:  [18] 18 (11/21 0528) BP: (135)/(76) 135/76 mmHg (11/21 0528) SpO2:  [99 %] 99 % (11/21 0528)  Labs:   Estimated body mass index is 34.37 kg/(m^2) as calculated from the following:   Height as of 12/17/12: 4\' 11"  (1.499 m).   Weight as of 11/07/12: 77.225 kg (170 lb 4 oz).   Imaging Review Plain radiographs demonstrate severe degenerative joint disease of the left knee(s). The overall alignment issignificant valgus. The bone quality appears to be good for age and reported activity level.  Assessment/Plan:  End stage arthritis, left knee   The patient history, physical examination, clinical judgment of the provider and imaging studies are consistent with end stage degenerative joint disease of the left knee(s) and total knee arthroplasty is deemed medically necessary. The treatment options including medical management, injection therapy arthroscopy and arthroplasty were discussed at length. The risks and benefits of total knee arthroplasty were presented and reviewed. The risks due to aseptic loosening, infection, stiffness, patella tracking problems, thromboembolic complications and other imponderables were discussed. The patient acknowledged the explanation, agreed to proceed with the plan and consent was signed. Patient is being admitted for inpatient treatment for surgery, pain control, PT, OT, prophylactic antibiotics, VTE prophylaxis,  progressive ambulation and ADL's and discharge planning. The patient is planning to be discharged to skilled nursing facility

## 2012-12-20 NOTE — Brief Op Note (Signed)
12/20/2012  9:19 AM  PATIENT:  Katherine [name]  67 y.o. female  PRE-OPERATIVE DIAGNOSIS:  Severe osteoarthritis left knee  POST-OPERATIVE DIAGNOSIS:  Severe osteoarthritis left knee  PROCEDURE:  Procedure(s): LEFT TOTAL KNEE ARTHROPLASTY (Left)  SURGEON:  Surgeon(s) and Role:    * Kathryne Hitch, MD - Primary  PHYSICIAN ASSISTANT: Rexene Edison, PA-C  ANESTHESIA:   spinal  EBL:  Total I/O In: 2000 [I.V.:2000] Out: 250 [Urine:150; Blood:100]  BLOOD ADMINISTERED:none  DRAINS: (medium) Hemovact drain(s) in the knee with  Suction Open   LOCAL MEDICATIONS USED:  OTHER Experil  SPECIMEN:  No Specimen  DISPOSITION OF SPECIMEN:  N/A  COUNTS:  YES  TOURNIQUET:   Total Tourniquet Time Documented: Thigh (Left) - 0 minutes Total: Thigh (Left) - 0 minutes   DICTATION: .Other Dictation: Dictation Number [id number]  PLAN OF CARE: Admit to inpatient   PATIENT DISPOSITION:  PACU - hemodynamically stable.   Delay start of Pharmacological VTE agent (>24hrs) due to surgical blood loss or risk of bleeding: no

## 2012-12-20 NOTE — Anesthesia Preprocedure Evaluation (Signed)
Anesthesia Evaluation  Patient identified by MRN, date of birth, ID band Patient awake    Reviewed: Allergy & Precautions, H&P , NPO status , Patient's Chart, lab work & pertinent test results  Airway Mallampati: II TM Distance: >3 FB Neck ROM: Full    Dental no notable dental hx.    Pulmonary neg pulmonary ROS,  breath sounds clear to auscultation  Pulmonary exam normal       Cardiovascular hypertension, Pt. on medications Rhythm:Regular Rate:Normal     Neuro/Psych negative neurological ROS  negative psych ROS   GI/Hepatic negative GI ROS, Neg liver ROS,   Endo/Other  diabetes  Renal/GU negative Renal ROS  negative genitourinary   Musculoskeletal negative musculoskeletal ROS (+)   Abdominal   Peds negative pediatric ROS (+)  Hematology negative hematology ROS (+)   Anesthesia Other Findings   Reproductive/Obstetrics negative OB ROS                           Anesthesia Physical Anesthesia Plan  ASA: II  Anesthesia Plan: General and Spinal   Post-op Pain Management:    Induction: Intravenous  Airway Management Planned: LMA  Additional Equipment:   Intra-op Plan:   Post-operative Plan:   Informed Consent: I have reviewed the patients History and Physical, chart, labs and discussed the procedure including the risks, benefits and alternatives for the proposed anesthesia with the patient or authorized representative who has indicated his/her understanding and acceptance.   Dental advisory given  Plan Discussed with: CRNA and Surgeon  Anesthesia Plan Comments:         Anesthesia Quick Evaluation

## 2012-12-20 NOTE — Transfer of Care (Signed)
Immediate Anesthesia Transfer of Care Note  Patient: Katherine [name]  Procedure(s) Performed: Procedure(s): LEFT TOTAL KNEE ARTHROPLASTY (Left)  Patient Location: PACU  Anesthesia Type:Spinal  Level of Consciousness: awake, alert , oriented and patient cooperative  Airway & Oxygen Therapy: Patient Spontanous Breathing and Patient connected to face mask oxygen  Post-op Assessment: Report given to PACU RN and Post -op Vital signs reviewed and stable  Post vital signs: Reviewed and stable  Complications: No apparent anesthesia complications

## 2012-12-20 NOTE — Anesthesia Postprocedure Evaluation (Signed)
  Anesthesia Post-op Note  Patient: Katherine [name]  Procedure(s) Performed: Procedure(s) (LRB): LEFT TOTAL KNEE ARTHROPLASTY (Left)  Patient Location: PACU  Anesthesia Type: Spinal  Level of Consciousness: awake and alert   Airway and Oxygen Therapy: Patient Spontanous Breathing  Post-op Pain: mild  Post-op Assessment: Post-op Vital signs reviewed, Patient's Cardiovascular Status Stable, Respiratory Function Stable, Patent Airway and No signs of Nausea or vomiting  Last Vitals:  Filed Vitals:   12/20/12 1430  BP: 96/60  Pulse: 97  Temp: 36.4 C  Resp: 16    Post-op Vital Signs: stable   Complications: No apparent anesthesia complications

## 2012-12-20 NOTE — Progress Notes (Signed)
Utilization review completed.  

## 2012-12-20 NOTE — Anesthesia Procedure Notes (Signed)

## 2012-12-21 LAB — BASIC METABOLIC PANEL
CO2: 24 mEq/L (ref 19–32)
Chloride: 100 mEq/L (ref 96–112)
Creatinine, Ser: 0.6 mg/dL (ref 0.50–1.10)
Potassium: 4.4 mEq/L (ref 3.5–5.1)
Sodium: 133 mEq/L — ABNORMAL LOW (ref 135–145)

## 2012-12-21 LAB — CBC
MCV: 90.6 fL (ref 78.0–100.0)
Platelets: 197 10*3/uL (ref 150–400)
RBC: 3.08 MIL/uL — ABNORMAL LOW (ref 3.87–5.11)
RDW: 14 % (ref 11.5–15.5)
WBC: 12.8 10*3/uL — ABNORMAL HIGH (ref 4.0–10.5)

## 2012-12-21 NOTE — Evaluation (Signed)
Physical Therapy Evaluation Patient Details Name: Katherine [name] MRN: [id number] DOB: 1945/08/06 Today's Date: 12/21/2012 Time: 0815-0900 PT Time Calculation (min): 45 min  PT Assessment / Plan / Recommendation History of Present Illness     Clinical Impression  Pt s/p L TKR presents with decreased L LE strength/ROM and post op pain limiting functional mobility.  Pt would benefit from follow up rehab at SNF level prior to d/c home with limited assist.   PT Assessment  Patient needs continued PT services    Follow Up Recommendations  SNF    Does the patient have the potential to tolerate intense rehabilitation      Barriers to Discharge        Equipment Recommendations  Rolling walker with 5" wheels    Recommendations for Other Services OT consult   Frequency 7X/week    Precautions / Restrictions Precautions Precautions: Knee;Fall Required Braces or Orthoses: Knee Immobilizer - Left Knee Immobilizer - Left: Discontinue once straight leg raise with < 10 degree lag Restrictions Weight Bearing Restrictions: No Other Position/Activity Restrictions: WBAT   Pertinent Vitals/Pain 6/10; premed, cold packs provided.      Mobility  Bed Mobility Bed Mobility: Supine to Sit Supine to Sit: 1: +2 Total assist Supine to Sit: Patient Percentage: 60% Details for Bed Mobility Assistance: cues for sequence and use of R LE to self assist Transfers Transfers: Sit to Stand;Stand to Sit Sit to Stand: 1: +2 Total assist Sit to Stand: Patient Percentage: 60% Stand to Sit: 1: +2 Total assist Stand to Sit: Patient Percentage: 60% Details for Transfer Assistance: cues for LE management and use of UEs to self assist Ambulation/Gait Ambulation/Gait Assistance: 1: +2 Total assist Ambulation/Gait: Patient Percentage: 70% Ambulation Distance (Feet): 6 Feet Assistive device: Rolling walker Ambulation/Gait Assistance Details: cues for sequence, stride length, posture and position from  RW Gait Pattern: Step-to pattern;Shuffle;Decreased step length - right;Decreased step length - left;Antalgic Stairs: No    Exercises Total Joint Exercises Ankle Circles/Pumps: AROM;10 reps;Supine;Both Quad Sets: AROM;Both;10 reps;Supine Heel Slides: AAROM;10 reps;Supine;Left Straight Leg Raises: AAROM;Left;10 reps;Supine   PT Diagnosis: Difficulty walking  PT Problem List: Decreased strength;Decreased range of motion;Decreased activity tolerance;Decreased mobility;Decreased knowledge of use of DME;Obesity;Pain PT Treatment Interventions: DME instruction;Gait training;Stair training;Functional mobility training;Therapeutic activities;Therapeutic exercise;Patient/family education     PT Goals(Current goals can be found in the care plan section) Acute Rehab PT Goals Patient Stated Goal: Rehab and home to resume previous lifestyle with decreased pain PT Goal Formulation: With patient Time For Goal Achievement: 12/27/12 Potential to Achieve Goals: Good  Visit Information  Last PT Received On: 12/21/12 Assistance Needed: +2       Prior Functioning  Home Living Family/patient expects to be discharged to:: Skilled nursing facility Living Arrangements: Alone Prior Function Level of Independence: Independent Communication Communication: No difficulties Dominant Hand: Right    Cognition  Cognition Arousal/Alertness: Awake/alert Behavior During Therapy: WFL for tasks assessed/performed Overall Cognitive Status: Within Functional Limits for tasks assessed    Extremity/Trunk Assessment Upper Extremity Assessment Upper Extremity Assessment: Overall WFL for tasks assessed Lower Extremity Assessment Lower Extremity Assessment: LLE deficits/detail LLE Deficits / Details: Quads 2/5 with AAROM at knee -10 - 40   Balance    End of Session PT - End of Session Equipment Utilized During Treatment: Gait belt Activity Tolerance: Patient tolerated treatment well Patient left: in chair;with  call bell/phone within reach Nurse Communication: Mobility status  GP     Claudy Abdallah 12/21/2012, 1:30 PM

## 2012-12-21 NOTE — Progress Notes (Signed)
Physical Therapy Treatment Patient Details Name: Katherine [name] MRN: [id number] DOB: May 26, 1945 Today's Date: 12/21/2012 Time: 4098-1191 PT Time Calculation (min): 35 min  PT Assessment / Plan / Recommendation  History of Present Illness     PT Comments   Pt motivated but ltd by c/o dizziness with OOB activity.  Bp dropped from 141/83 in sitting to 113/77 after 2 min in standing.  Follow Up Recommendations  SNF     Does the patient have the potential to tolerate intense rehabilitation     Barriers to Discharge        Equipment Recommendations  Rolling walker with 5" wheels    Recommendations for Other Services OT consult  Frequency 7X/week   Progress towards PT Goals Progress towards PT goals: Progressing toward goals  Plan Current plan remains appropriate    Precautions / Restrictions Precautions Precautions: Knee;Fall Required Braces or Orthoses: Knee Immobilizer - Left Knee Immobilizer - Left: Discontinue once straight leg raise with < 10 degree lag Restrictions Weight Bearing Restrictions: No Other Position/Activity Restrictions: WBAT   Pertinent Vitals/Pain 5/10; premed    Mobility  Bed Mobility Bed Mobility: Supine to Sit;Sit to Supine Supine to Sit: 3: Mod assist Sit to Supine: 3: Mod assist Details for Bed Mobility Assistance: cues for sequence and use of R LE to self assist Transfers Transfers: Sit to Stand;Stand to Sit Sit to Stand: 3: Mod assist Stand to Sit: 3: Mod assist Details for Transfer Assistance: cues for LE management and use of UEs to self assist Ambulation/Gait Ambulation/Gait Assistance: 3: Mod assist Ambulation Distance (Feet): 3 Feet Assistive device: Rolling walker Ambulation/Gait Assistance Details: Pt side stepping to HOB only - BP dropping with c/o dizziness Gait Pattern: Step-to pattern;Shuffle;Decreased step length - right;Decreased step length - left;Antalgic Stairs: No    Exercises Total Joint Exercises Ankle  Circles/Pumps: AROM;10 reps;Supine;Both Quad Sets: AROM;Both;10 reps;Supine Heel Slides: AAROM;Supine;Left;15 reps Straight Leg Raises: AAROM;Left;10 reps;Supine   PT Diagnosis:    PT Problem List:   PT Treatment Interventions:     PT Goals (current goals can now be found in the care plan section) Acute Rehab PT Goals Patient Stated Goal: Rehab and home to resume previous lifestyle with decreased pain PT Goal Formulation: With patient Time For Goal Achievement: 12/27/12 Potential to Achieve Goals: Good  Visit Information  Last PT Received On: 12/21/12 Assistance Needed: +1    Subjective Data  Subjective: I want to get up Patient Stated Goal: Rehab and home to resume previous lifestyle with decreased pain   Cognition  Cognition Arousal/Alertness: Awake/alert Behavior During Therapy: WFL for tasks assessed/performed Overall Cognitive Status: Within Functional Limits for tasks assessed    Balance     End of Session PT - End of Session Equipment Utilized During Treatment: Gait belt;Left knee immobilizer Activity Tolerance: Patient tolerated treatment well Patient left: in bed;with call bell/phone within reach Nurse Communication: Mobility status;Other (comment) (orthostatic BP)   GP     Katherine [name] 12/21/2012, 4:33 PM

## 2012-12-21 NOTE — Progress Notes (Signed)
Patient ID: Katherine [name], female   DOB: [birth year], 67 y.o.   MRN: [id number] PATIENT ID: Katherine [name]        MRN:  [id number]          DOB/AGE: 1945/07/11 / 67 y.o.    Katherine Campbell, MD   Jacqualine Code, PA-C [address] [place], [place]  [address]                             [phone]   PROGRESS NOTE  Subjective:  negative for Chest Pain  positive for Shortness of Breath-occasional SOB much like pre op.OK now  negative for Nausea/Vomiting   negative for Calf Pain    Tolerating Diet: yes         Patient reports pain as mild.     Sitting up in chair-comfortable  Objective: Vital signs in last 24 hours:   Patient Vitals for the past 24 hrs:  BP Temp Temp src Pulse Resp SpO2  12/21/12 0844 110/76 mmHg - - 89 16 98 %  12/21/12 0800 - - - - 16 98 %  12/21/12 0434 120/81 mmHg 99.6 F (37.6 C) Oral 85 18 97 %  12/21/12 0036 108/74 mmHg 99.7 F (37.6 C) Oral 98 18 96 %  12/20/12 2345 - - - 104 - 95 %  12/20/12 2025 - - - - 16 -  12/20/12 1923 123/73 mmHg 98.2 F (36.8 C) Oral 111 18 94 %  12/20/12 1600 - - - - 18 98 %  12/20/12 1430 96/60 mmHg 97.6 F (36.4 C) Oral 97 16 97 %  12/20/12 1330 127/79 mmHg 97.9 F (36.6 C) Oral 101 16 97 %  12/20/12 1230 115/74 mmHg 97.5 F (36.4 C) - 88 16 97 %      Intake/Output from previous day:   11/21 0701 - 11/22 0700 In: 3880 [I.V.:3665] Out: 2540 [Urine:1700; Drains:740]   Intake/Output this shift:   11/22 0701 - 11/22 1900 In: 120 [P.O.:120] Out: -    Intake/Output     11/21 0701 - 11/22 0700 11/22 0701 - 11/23 0700   P.O.  120   I.V. (mL/kg) 3665 (49.3)    Other 165    IV Piggyback 50    Total Intake(mL/kg) 3880 (52.2) 120 (1.6)   Urine (mL/kg/hr) 1700 (1)    Drains 740 (0.4)    Blood 100 (0.1)    Total Output 2540     Net +1340 +120           LABORATORY DATA:  Recent Labs  12/17/12 1120 12/21/12 0430  WBC 9.7 12.8*  HGB 14.0 9.5*  HCT 43.1 27.9*  PLT 345 197    Recent Labs   12/17/12 1120 12/21/12 0430  NA 136 133*  K 4.0 4.4  CL 98 100  CO2 28 24  BUN 16 17  CREATININE 0.55 0.60  GLUCOSE 119* 122*  CALCIUM 9.5 8.0*   Lab Results  Component Value Date   INR 0.99 12/17/2012    Recent Radiographic Studies :  Dg Chest 2 View  12/17/2012   CLINICAL DATA:  Left total knee arthroplasty. Preop chest radiograph. History of a heart murmur.  EXAM: CHEST  2 VIEW  COMPARISON:  02/21/2010  FINDINGS: Cardiac silhouette is normal in size. Mediastinum is normal in contour. No hilar masses.  The lungs are clear. There is a marked compression fracture at the thoracolumbar junction, which appears chronic the  bony thorax is demineralized but otherwise intact.  IMPRESSION: No acute cardiopulmonary disease.  Compression fracture at the thoracolumbar junction, most likely chronic. It was not present are lateral chest radiograph dated 07/20/2008, however.   Electronically Signed   By: Amie Portland M.D.   On: 12/17/2012 15:15   US Soft Tissue Head/neck  11/26/2012   CLINICAL DATA:  Thyroid nodule palpated on physical examination.  EXAM: THYROID ULTRASOUND  TECHNIQUE: Ultrasound examination of the thyroid gland and adjacent soft tissues was performed.  COMPARISON:  None.  FINDINGS: Right thyroid lobe  Measurements: 4.3 x 2.2 x 2.2 cm. A partially solid and partially cystic nodule in the superior right lobe measures approximately 1.7 x 1.0 x 1.5 cm. This nodule is noncalcified. A smaller superior right solid nodule measures 1.1 x 0.6 x 0.7 cm. This nodule is noncalcified.  Left thyroid lobe  Measurements: 4.7 x 1.9 x 1.7 cm. Tiny benign cystic area measures 0.3 cm.  Isthmus  Thickness: 0.3 cm.  No nodules visualized.  Lymphadenopathy  None visualized.  IMPRESSION: Two separate right thyroid nodules, as above, which have relatively benign characteristics. These can be followed by ultrasound rather than committing to biopsy at this time. Recommend followup thyroid ultrasound in 6-12 months    Electronically Signed   By: Irish Lack M.D.   On: 11/26/2012 11:27   Dg Knee Left Port  12/20/2012   CLINICAL DATA:  Status post arthroplasty  EXAM: PORTABLE LEFT KNEE - 1-2 VIEW  COMPARISON:  None.  FINDINGS: Frontal and lateral views were obtained. The patient has had a total knee replacement with femoral and tibial prosthetic components appearing well-seated. There is a drain anterior to the proximal tibial component. There is no fracture or dislocation. Soft tissue air is an expected postoperative finding.  IMPRESSION: Prosthetic components appear well seated. No fracture or dislocation.   Electronically Signed   By: Bretta Bang M.D.   On: 12/20/2012 10:26     Examination:  General appearance: alert, cooperative and no distress  Wound Exam: clean, dry, intact   Drainage:  None: wound tissue dry-hemovac drainage about 90cc's in past 12 hrs-D/C tommorrow  Motor Exam: EHL, FHL, Anterior Tibial and Posterior Tibial Intact Sensory Exam:has DM neuropathy, but no change from pre op  Vascular Exam: Normal  Assessment:    1 Day Post-Op  Procedure(s) (LRB): LEFT TOTAL KNEE ARTHROPLASTY (Left)  ADDITIONAL DIAGNOSIS:  Principal Problem:   Arthritis of knee, left  Acute Blood Loss Anemia-monitor   Plan: Physical Therapy as ordered Weight Bearing as Tolerated (WBAT)  DVT Prophylaxis:  Aspirin  DISCHARGE PLAN: Home  DISCHARGE NEEDS: HHPT, CPM, Walker and 3-in-1 comode seat   D/c hemovac in am, foley out either this pm or in am depending upon course in PT, monitor H&H      [name] 12/21/2012 11:57 AM

## 2012-12-21 NOTE — Op Note (Signed)
NAMEABREY, BRADWAY             ACCOUNT NO.:  [id number]  MEDICAL RECORD NO.:  [id number]  LOCATION:  1615                         FACILITY:  [facility]  PHYSICIAN:  Vanita Panda. Magnus Ivan, M.D.DATE OF BIRTH:  20-Oct-1945  DATE OF PROCEDURE:  12/20/2012 DATE OF DISCHARGE:                              OPERATIVE REPORT   PREOPERATIVE DIAGNOSES:  Severe osteoarthritis and degenerative joint disease, left knee.  POSTOPERATIVE DIAGNOSES:  Severe osteoarthritis and degenerative joint disease, left knee.  PROCEDURE:  Left total knee arthroplasty.  IMPLANTS:  [employer]ker Triathlon knee with size 2 femur, size 2 tibial tray, 11-mm fix-bearing polyethylene insert, size 29 patellar button.  SURGEON:  Vanita Panda. Magnus Ivan, M.D.  ASSISTANT:  Richardean Canal, PA-C  ANESTHESIA: 1. Spinal. 2. Exparel intra-articular injection.  TOURNIQUET TIME:  Just over 1 hour.  BLOOD LOSS:  150 mL.  COMPLICATIONS:  None.  INDICATIONS:  Ms. Kliebert is a very pleasant 67 year old female with severe valgus deformity of her left knee.  She has complete loss of the joint space at this point, and her activities of daily living are severely limited.  Her mobility is limited and her pain is daily.  She has failed conservative treatment and at this point, wish to proceed with a total knee arthroplasty.  The risks and benefits of the surgery were explained to her in detail, and she does wish to proceed.  These risks including acute blood loss, anemia, nerve and vessel injury, fracture, infection and DVT.  The goals are improved mobility, decreased pain, and overall improved quality of life.  PROCEDURE DESCRIPTION:  After informed consent was obtained and appropriate left knee was marked, she was brought to the operating room and spinal anesthesia was obtained while she was on the table.  She was laid in the supine position, a Foley catheter was placed and then, her nonsterile tourniquet was placed on her  upper left thigh.  Her left leg was then prepped and draped from the thigh down the toes with DuraPrep and sterile drapes.  Time-out was called to the correct patient and correct left knee.  We then used an Esmarch wrap out the leg and the tourniquet was insufflated to 300 mm of pressure.  We then made an incision directly over the patella and carried this proximally and distally.  We dissected down to the knee joint and then performed a medial parapatellar arthrotomy.  We found a large effusion in the knee and the knee was basically devoid of cartilage especially on the lateral aspect with a significant valgus deformity.  We removed the osteophytes from the knee and remnants of the medial and lateral meniscus was placed, there was no lateral meniscus left.  The ACL and PCL were removed as well.  We then turned attention to the tibia, cut with the knee in a flexed position.  We placed the tibial cutting guide from the [employer] Triathlon system and took 9 mm off the high side correcting for malalignment and neutral slope.  We then made our distal tibia cut.  We then used an intramedullary guide for the distal femoral cut.  We set our guide at 5 degrees left and I took 10 mm off the distal  femur.  I brought the knee back down to extension and with 11-mm extension block, she seemed to be stable.  We then went back to the femur and put our femoral sizing guide on based off the epicondylar axis and then chose a size 2 femur.  We then put the 4-in-1 cutting block on the femur and made our anterior and posterior cuts followed by our chamfer cuts.  We then went back to the tibia and we set our rotation for the tibia off the tibial tubercle.  We sized for size 2 tibia and made our keel punch for this.  We then placed the trial size 2 tibia and the trial femur. We trialed several polyethylene inserts and with the size 16, I felt that she was very stable with good motion.  We then made our  patellar cut and trialed a 29 patellar button.  I was pleased with the alignment overall with the knee.  We then removed all trial components and copiously irrigated the knee with normal saline solution using pulsatile lavage.  We then placed a mixture of Exparel with 20 mL of Exparel and 40 mL of saline that we injected throughout the joint capsule.  We then cemented the real Stryker triathlon size 2 tibia followed by the real size 2 femur.  We placed the real 16-mm polyethylene fix-bearing insert and cemented the 29 patellar button.  Once the cement had dried and we let the tourniquet down, hemostasis was obtained with electrocautery. We placed a medium Hemovac in the arthrotomy and closed the arthrotomy with interrupted #1 Vicryl followed by 2-0 Vicryl in subcutaneous tissue, 4-0 Monocryl in the subcuticular stitch and Dermabond on the skin.  An Aquacel dressing was applied.  She was taken to the recovery room in stable condition.  All final counts were correct.  There were no complications noted.  Of note, Richardean Canal, PA-C assisted throughout the entire case and his assistance was crucial for retracting, helping to remove the cement, debris and getting good alignment of the knee as well as layered closure of the wound.     Vanita Panda. Magnus Ivan, M.D.     CYB/MEDQ  D:  12/20/2012  T:  12/21/2012  Job:  [id number]

## 2012-12-22 LAB — CBC
Hemoglobin: 9 g/dL — ABNORMAL LOW (ref 12.0–15.0)
MCH: 30.2 pg (ref 26.0–34.0)
MCHC: 33.3 g/dL (ref 30.0–36.0)
MCV: 90.6 fL (ref 78.0–100.0)
Platelets: 243 10*3/uL (ref 150–400)
RDW: 14.1 % (ref 11.5–15.5)

## 2012-12-22 NOTE — Progress Notes (Signed)
Physical Therapy Treatment Patient Details Name: Katherine [name] MRN: [id number] DOB: Jun 07, 1945 Today's Date: 12/22/2012 Time: 0950-1020 PT Time Calculation (min): 30 min  PT Assessment / Plan / Recommendation  History of Present Illness LTKR   PT Comments   **Progressing with mobility, she walked 65' today. Active bleeding from dressed drainage site with LLE exercises, applied 4x4 dressing over existing dressing, RN notified. *  Follow Up Recommendations  SNF     Does the patient have the potential to tolerate intense rehabilitation     Barriers to Discharge        Equipment Recommendations  Rolling walker with 5" wheels    Recommendations for Other Services OT consult  Frequency 7X/week   Progress towards PT Goals Progress towards PT goals: Progressing toward goals  Plan Current plan remains appropriate    Precautions / Restrictions Precautions Precautions: Knee;Fall Required Braces or Orthoses: Knee Immobilizer - Left Knee Immobilizer - Left: Discontinue once straight leg raise with < 10 degree lag Restrictions Weight Bearing Restrictions: No Other Position/Activity Restrictions: WBAT   Pertinent Vitals/Pain **1/10 L knee Premedicated, ice applied*    Mobility  Bed Mobility Bed Mobility: Not assessed Supine to Sit: 3: Mod assist Sit to Supine: 3: Mod assist Details for Bed Mobility Assistance: cues for sequence and use of R LE to self assist Transfers Sit to Stand: With upper extremity assist;4: Min assist;From chair/3-in-1 Sit to Stand: Patient Percentage: 80% Stand to Sit: With upper extremity assist;To chair/3-in-1;4: Min assist Stand to Sit: Patient Percentage: 80% Details for Transfer Assistance: cues for LE management and use of UEs to self assist Ambulation/Gait Ambulation/Gait Assistance: 4: Min assist Ambulation Distance (Feet): 55 Feet Assistive device: Rolling walker Gait Pattern: Step-to pattern;Shuffle;Decreased step length - right;Decreased  step length - left;Antalgic Gait velocity: decr General Gait Details: min A to maneuver RW    Exercises Total Joint Exercises Ankle Circles/Pumps: AROM;10 reps;Supine;Both Heel Slides: AAROM;Supine;Left;5 reps (active bleeding from drain site with exercise, so stopped, RN aware)   PT Diagnosis:    PT Problem List:   PT Treatment Interventions:     PT Goals (current goals can now be found in the care plan section) Acute Rehab PT Goals Patient Stated Goal: Rehab and home to resume previous lifestyle with decreased pain PT Goal Formulation: With patient Time For Goal Achievement: 12/27/12 Potential to Achieve Goals: Good  Visit Information  Last PT Received On: 12/22/12 Assistance Needed: +1 History of Present Illness: LTKR    Subjective Data  Patient Stated Goal: Rehab and home to resume previous lifestyle with decreased pain   Cognition  Cognition Arousal/Alertness: Awake/alert Behavior During Therapy: WFL for tasks assessed/performed Overall Cognitive Status: Within Functional Limits for tasks assessed    Balance     End of Session PT - End of Session Equipment Utilized During Treatment: Gait belt;Left knee immobilizer Activity Tolerance: Patient tolerated treatment well Patient left: in bed;with call bell/phone within reach Nurse Communication: Mobility status;Other (comment) (orthostatic BP)   GP     Ralene Bathe Kistler 12/22/2012, 10:38 AM [phone]

## 2012-12-22 NOTE — Progress Notes (Signed)
Clinical Social Work Department BRIEF PSYCHOSOCIAL ASSESSMENT 12/22/2012  Patient:  Katherine [name], Katherine [name]     Account Number:  [id number]     Admit date:  12/20/2012  Clinical Social Worker:  Doroteo Glassman  Date/Time:  12/22/2012 10:40 AM  Referred by:  Physician  Date Referred:  12/22/2012 Referred for  SNF Placement   Other Referral:   Interview type:  Patient Other interview type:    PSYCHOSOCIAL DATA Living Status:  ALONE Admitted from facility:   Level of care:   Primary support name:  Charlean Sanfilippo Primary support relationship to patient:  SIBLING Degree of support available:   strong    CURRENT CONCERNS Current Concerns  Post-Acute Placement   Other Concerns:    SOCIAL WORK ASSESSMENT / PLAN Met with Pt to discuss d/c plans.    Pt stated that she has been to [facility] by hx and that she'd like to return there.    Pt gave CSW permission to notify [facility] of her wish to return and to coordinate her d/c.    CSW provided Pt with [name] [place] SNF list.    CSW thanked Pt for her time.   Assessment/plan status:  Psychosocial Support/Ongoing Assessment of Needs Other assessment/ plan:   Information/referral to community resources:   SNF list    PATIENT'S/FAMILY'S RESPONSE TO PLAN OF CARE: Pt's response to her plan of care was joy.  Pt was extremely happy to go back to [facility] and stated, several times, that she feels blessed to have had such good care at that facility.    Pt was pleasant and cooperative and is happy with her d/c plans.    Pt thanked CSW for time and assistance.   Providence Crosby, LCSWA Clinical Social Work [phone]

## 2012-12-22 NOTE — Evaluation (Signed)
Occupational Therapy Evaluation Patient Details Name: QUYNH BASSO MRN: [id number] DOB: [birth year] Today's Date: 12/22/2012 Time: 0102-7253 OT Time Calculation (min): 23 min  OT Assessment / Plan / Recommendation History of present illness LTKR   Clinical Impression   Pt s/p L TKR presents with decreasedI with ADL activity  and post op pain limiting functional mobility. Pt would benefit from follow up rehab at SNF level prior to d/c home with limited assist.      OT Assessment  Patient needs continued OT Services    Follow Up Recommendations  SNF       Equipment Recommendations  None recommended by OT          Precautions / Restrictions Precautions Precautions: Knee;Fall Required Braces or Orthoses: Knee Immobilizer - Left Knee Immobilizer - Left: Discontinue once straight leg raise with < 10 degree lag Restrictions Weight Bearing Restrictions: No Other Position/Activity Restrictions: WBAT       ADL  Grooming: Set up;Wash/dry face Upper Body Bathing: Minimal assistance Where Assessed - Upper Body Bathing: Unsupported sitting Lower Body Bathing: Maximal assistance Where Assessed - Lower Body Bathing: Supported sit to stand Upper Body Dressing: Minimal assistance Where Assessed - Upper Body Dressing: Unsupported sitting Lower Body Dressing: Maximal assistance Where Assessed - Lower Body Dressing: Supported sit to Pharmacist, hospital: Moderate assistance Toilet Transfer Method: Sit to stand;Stand pivot Toilet Transfer Equipment: Bedside commode Toileting - Clothing Manipulation and Hygiene: Moderate assistance Where Assessed - Toileting Clothing Manipulation and Hygiene: Standing      OT Goals(Current goals can be found in the care plan section) Acute Rehab OT Goals Patient Stated Goal: Rehab and home to resume previous lifestyle with decreased pain OT Goal Formulation: With patient Time For Goal Achievement: 12/29/12 Potential to Achieve Goals:  Good  Visit Information  Last OT Received On: 12/22/12 Assistance Needed: +1 History of Present Illness: LTKR       Prior Functioning     Home Living Family/patient expects to be discharged to:: Skilled nursing facility Living Arrangements: Alone Prior Function Level of Independence: Independent Communication Communication: No difficulties Dominant Hand: Right         Vision/Perception Vision - History Patient Visual Report: No change from baseline   Cognition  Cognition Arousal/Alertness: Awake/alert Behavior During Therapy: WFL for tasks assessed/performed Overall Cognitive Status: Within Functional Limits for tasks assessed    Extremity/Trunk Assessment Upper Extremity Assessment Upper Extremity Assessment: Overall WFL for tasks assessed     Mobility Bed Mobility Bed Mobility: Supine to Sit;Sit to Supine Supine to Sit: 3: Mod assist Sit to Supine: 3: Mod assist Details for Bed Mobility Assistance: cues for sequence and use of R LE to self assist Transfers Transfers: Sit to Stand;Stand to Sit Sit to Stand: 3: Mod assist;With upper extremity assist;From bed Stand to Sit: 3: Mod assist;With upper extremity assist;To chair/3-in-1 Details for Transfer Assistance: cues for LE management and use of UEs to self assist           End of Session OT - End of Session Equipment Utilized During Treatment: Rolling walker Activity Tolerance: Patient tolerated treatment well Patient left: in chair Nurse Communication: Mobility status  GO     Alba Cory 12/22/2012, 9:43 AM

## 2012-12-22 NOTE — Progress Notes (Signed)
Physical Therapy Treatment Patient Details Name: Katherine [name] MRN: [id number] DOB: 09/27/1945 Today's Date: 12/22/2012 Time: 4098-1191 PT Time Calculation (min): 24 min  PT Assessment / Plan / Recommendation  History of Present Illness LTKR   PT Comments   *Pt progressing well with mobility. She walked 75', performed L TKA exercises with assist. Expect she will be ready to DC to SNF tomorrow.**  Follow Up Recommendations  SNF     Does the patient have the potential to tolerate intense rehabilitation     Barriers to Discharge        Equipment Recommendations  Rolling walker with 5" wheels    Recommendations for Other Services OT consult  Frequency 7X/week   Progress towards PT Goals Progress towards PT goals: Progressing toward goals  Plan Current plan remains appropriate    Precautions / Restrictions Precautions Precautions: Knee;Fall Required Braces or Orthoses: Knee Immobilizer - Left Knee Immobilizer - Left: Discontinue once straight leg raise with < 10 degree lag Restrictions Weight Bearing Restrictions: No Other Position/Activity Restrictions: WBAT   Pertinent Vitals/Pain *Pt verbalized L knee discomfort with activity, did not rate pain Ice applied**    Mobility  Bed Mobility Bed Mobility: Supine to Sit Supine to Sit: 4: Min assist Details for Bed Mobility Assistance: cues for sequence and use of R LE to self assist Transfers Sit to Stand: With upper extremity assist;From bed;4: Min assist Sit to Stand: Patient Percentage: 80% Stand to Sit: With upper extremity assist;To chair/3-in-1;4: Min assist Stand to Sit: Patient Percentage: 80% Details for Transfer Assistance: cues for LE management and use of UEs to self assist Ambulation/Gait Ambulation/Gait Assistance: 4: Min assist Ambulation Distance (Feet): 75 Feet Assistive device: Rolling walker Gait Pattern: Step-to pattern;Shuffle;Decreased step length - right;Decreased step length - left;Antalgic Gait  velocity: decr General Gait Details: min A to maneuver RW Stairs: No    Exercises Total Joint Exercises Ankle Circles/Pumps: AROM;10 reps;Supine;Both Quad Sets: AROM;Both;10 reps;Supine Short Arc Quad: AAROM;Left;10 reps;Supine Heel Slides: AAROM;Supine;Left;10 reps Hip ABduction/ADduction: AAROM;Left;10 reps Straight Leg Raises: AAROM;Left;10 reps;Supine   PT Diagnosis:    PT Problem List:   PT Treatment Interventions:     PT Goals (current goals can now be found in the care plan section) Acute Rehab PT Goals Patient Stated Goal: Rehab and home to resume previous lifestyle with decreased pain PT Goal Formulation: With patient Time For Goal Achievement: 12/27/12 Potential to Achieve Goals: Good  Visit Information  Last PT Received On: 12/22/12 Assistance Needed: +1 History of Present Illness: LTKR    Subjective Data  Patient Stated Goal: Rehab and home to resume previous lifestyle with decreased pain   Cognition  Cognition Arousal/Alertness: Awake/alert Behavior During Therapy: WFL for tasks assessed/performed Overall Cognitive Status: Within Functional Limits for tasks assessed    Balance     End of Session PT - End of Session Equipment Utilized During Treatment: Gait belt;Left knee immobilizer Activity Tolerance: Patient tolerated treatment well Patient left: with call bell/phone within reach;in chair Nurse Communication: Mobility status;Other (comment) (bloody drainage from drain site)   GP     Tamala Ser 12/22/2012, 2:01 PM [phone]

## 2012-12-22 NOTE — Progress Notes (Signed)
Patient ID: Katherine [name], female   DOB: March 25, 1945, 67 y.o.   MRN: [id number] PATIENT ID: Katherine [name]        MRN:  [id number]          DOB/AGE: 08/14/1945 / 67 y.o.    Katherine Campbell, MD   Katherine Code, PA-C [address] [place], [place]  [address]                             [phone]   PROGRESS NOTE  Subjective:  negative for Chest Pain  positive for Shortness of Breath-no change from pre op status  negative for Nausea/Vomiting   negative for Calf Pain    Tolerating Diet: yes         Patient reports pain as mild.       Objective: Vital signs in last 24 hours:   Patient Vitals for the past 24 hrs:  BP Temp Temp src Pulse Resp SpO2  12/22/12 0743 - - - - 16 98 %  12/22/12 0500 147/81 mmHg 98.1 F (36.7 C) Oral 105 16 97 %  12/21/12 2132 139/83 mmHg 98.2 F (36.8 C) Oral 101 16 94 %  12/21/12 2000 - - - - 15 -  12/21/12 1600 - - - - 16 100 %  12/21/12 1409 119/77 mmHg 98.7 F (37.1 C) Oral 92 16 100 %  12/21/12 1200 - - - - 16 98 %      Intake/Output from previous day:   11/22 0701 - 11/23 0700 In: 480 [P.O.:480] Out: 1625 [Urine:1300; Drains:325]   Intake/Output this shift:   11/23 0701 - 11/23 1900 In: 360 [P.O.:360] Out: -    Intake/Output     11/22 0701 - 11/23 0700 11/23 0701 - 11/24 0700   P.O. 480 360   I.V. (mL/kg)     Other     IV Piggyback     Total Intake(mL/kg) 480 (6.5) 360 (4.8)   Urine (mL/kg/hr) 1300 (0.7)    Drains 325 (0.2)    Blood     Total Output 1625     Net -1145 +360           LABORATORY DATA:  Recent Labs  12/17/12 1120 12/21/12 0430 12/22/12 0455  WBC 9.7 12.8* 11.8*  HGB 14.0 9.5* 9.0*  HCT 43.1 27.9* 27.0*  PLT 345 197 243    Recent Labs  12/17/12 1120 12/21/12 0430  NA 136 133*  K 4.0 4.4  CL 98 100  CO2 28 24  BUN 16 17  CREATININE 0.55 0.60  GLUCOSE 119* 122*  CALCIUM 9.5 8.0*   Lab Results  Component Value Date   INR 0.99 12/17/2012    Recent Radiographic Studies :  Dg Chest 2  View  12/17/2012   CLINICAL DATA:  Left total knee arthroplasty. Preop chest radiograph. History of a heart murmur.  EXAM: CHEST  2 VIEW  COMPARISON:  02/21/2010  FINDINGS: Cardiac silhouette is normal in size. Mediastinum is normal in contour. No hilar masses.  The lungs are clear. There is a marked compression fracture at the thoracolumbar junction, which appears chronic the bony thorax is demineralized but otherwise intact.  IMPRESSION: No acute cardiopulmonary disease.  Compression fracture at the thoracolumbar junction, most likely chronic. It was not present are lateral chest radiograph dated 07/20/2008, however.   Electronically Signed   By: Amie Portland M.D.   On: 12/17/2012 15:15   US  Soft Tissue Head/neck  11/26/2012   CLINICAL DATA:  Thyroid nodule palpated on physical examination.  EXAM: THYROID ULTRASOUND  TECHNIQUE: Ultrasound examination of the thyroid gland and adjacent soft tissues was performed.  COMPARISON:  None.  FINDINGS: Right thyroid lobe  Measurements: 4.3 x 2.2 x 2.2 cm. A partially solid and partially cystic nodule in the superior right lobe measures approximately 1.7 x 1.0 x 1.5 cm. This nodule is noncalcified. A smaller superior right solid nodule measures 1.1 x 0.6 x 0.7 cm. This nodule is noncalcified.  Left thyroid lobe  Measurements: 4.7 x 1.9 x 1.7 cm. Tiny benign cystic area measures 0.3 cm.  Isthmus  Thickness: 0.3 cm.  No nodules visualized.  Lymphadenopathy  None visualized.  IMPRESSION: Two separate right thyroid nodules, as above, which have relatively benign characteristics. These can be followed by ultrasound rather than committing to biopsy at this time. Recommend followup thyroid ultrasound in 6-12 months   Electronically Signed   By: Irish Lack M.D.   On: 11/26/2012 11:27   Dg Knee Left Port  12/20/2012   CLINICAL DATA:  Status post arthroplasty  EXAM: PORTABLE LEFT KNEE - 1-2 VIEW  COMPARISON:  None.  FINDINGS: Frontal and lateral views were obtained. The  patient has had a total knee replacement with femoral and tibial prosthetic components appearing well-seated. There is a drain anterior to the proximal tibial component. There is no fracture or dislocation. Soft tissue air is an expected postoperative finding.  IMPRESSION: Prosthetic components appear well seated. No fracture or dislocation.   Electronically Signed   By: Bretta Bang M.D.   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  Vascular Exam: Normal  Assessment:    2 Days Post-Op  Procedure(s) (LRB): LEFT TOTAL KNEE ARTHROPLASTY (Left)  ADDITIONAL DIAGNOSIS:  Principal Problem:   Arthritis of knee, left  Acute Blood Loss Anemia-stable this am-no need for transfusion,a little dizzy when OOB,but BP seems stable   Plan: Physical Therapy as ordered Weight Bearing as Tolerated (WBAT)  DVT Prophylaxis:  Aspirin  DISCHARGE PLAN: Skilled Nursing Facility/Rehab  DISCHARGE NEEDS: HHPT and Walker Feeling better this am-hemovac and foley out, continue PT, await placement-discussed with [facility] who wishes rehab, also suggested by PT-social services to evaluate this am        [name] 12/22/2012 9:46 AM

## 2012-12-23 LAB — CBC
MCV: 90.2 fL (ref 78.0–100.0)
Platelets: 192 10*3/uL (ref 150–400)
RDW: 13.9 % (ref 11.5–15.5)
WBC: 10.7 10*3/uL — ABNORMAL HIGH (ref 4.0–10.5)

## 2012-12-23 MED ORDER — OXYCODONE-ACETAMINOPHEN 5-325 MG PO TABS: 1.0000 | ORAL_TABLET | ORAL | Status: DC | PRN

## 2012-12-23 MED ORDER — METHOCARBAMOL 500 MG PO TABS: 500.0000 mg | ORAL_TABLET | Freq: Two times a day (BID) | ORAL | Status: DC

## 2012-12-23 MED ORDER — ASPIRIN 325 MG PO TBEC: 325.0000 mg | DELAYED_RELEASE_TABLET | Freq: Two times a day (BID) | ORAL | Status: DC

## 2012-12-23 MED ORDER — FUROSEMIDE 10 MG/ML IJ SOLN
20.0000 mg | Freq: Once | INTRAMUSCULAR | Status: AC
  Administered 2012-12-23: 12:00:00 20 mg via INTRAVENOUS
  Filled 2012-12-23: qty 2

## 2012-12-23 NOTE — Progress Notes (Signed)
Clinical Social Work Department CLINICAL SOCIAL WORK PLACEMENT NOTE 12/23/2012  Patient:  [name], Katherine [name]  Account Number:  [id number] Admit date:  12/20/2012  Clinical Social Worker:  Unk Lightning, LCSW  Date/time:  12/23/2012 03:00 PM  Clinical Social Work is seeking post-discharge placement for this patient at the following level of care:   SKILLED NURSING   (*CSW will update this form in Epic as items are completed)   12/22/2012  Patient/family provided with [facility] Department of Clinical Social Work's list of facilities offering this level of care within the geographic area requested by the patient (or if unable, by the patient's family).  12/22/2012  Patient/family informed of their freedom to choose among providers that offer the needed level of care, that participate in Medicare, Medicaid or managed care program needed by the patient, have an available bed and are willing to accept the patient.  12/22/2012  Patient/family informed of [facility]' ownership interest in [facility], as well as of the fact that they are under no obligation to receive care at this facility.  PASARR submitted to EDS on  PASARR number received from EDS on   FL2 transmitted to all facilities in geographic area requested by pt/family on  12/22/2012 FL2 transmitted to all facilities within larger geographic area on   Patient informed that his/her managed care company has contracts with or will negotiate with  certain facilities, including the following:     Patient/family informed of bed offers received:  12/23/2012 Patient chooses bed at [facility] Physician recommends and patient chooses bed at    Patient to be transferred to [facility] on  12/23/2012 Patient to be transferred to facility by [facility]  The following physician request were entered in Epic:   Additional Comments:

## 2012-12-23 NOTE — Progress Notes (Signed)
Patient ID: Katherine [name], female   DOB: 1945/04/15, 67 y.o.   MRN: [id number] Looks good today, but a little fatigued.  Acute blood loss anemia from surgery.  Doing well with therapy.  Will transfuse 2 units of PRBCs today this morning then can discharge to SNF this afternoon.

## 2012-12-23 NOTE — Progress Notes (Signed)
Clinical Social Work  Patient excited to return to [facility] since she has been at that facility in the past. CSW faxed DC summary to SNF who is agreeable to admit at 1700 today. CSW prepared DC packet with FL2 and hard scripts included. CSW informed patient and family (per patient's request). CSW coordinated transportation via [facility] per patient request who understands no guarantee of payment. PT[facility] request #: [id number].  CSW is signing off but available if further needs arise.  Unk Lightning, LCSW (Coverage for [employer])

## 2012-12-23 NOTE — Discharge Summary (Signed)
Patient ID: Katherine [name] MRN: [id number] DOB/AGE: 1945-07-05 67 y.o.  Admit date: 12/20/2012 Discharge date: 12/23/2012  Admission Diagnoses:  Principal Problem:   Arthritis of knee, left   Discharge Diagnoses:  Same  Past Medical History  Diagnosis Date  . ABDOMINAL PAIN, LOWER 02/04/2010  . ABNORMAL THYROID FUNCTION TESTS 08/04/2009  . ALLERGIC RHINITIS 09/05/2006  . ANXIETY 07/31/2007  . DEPRESSION 09/05/2006  . DIVERTICULOSIS, COLON 09/05/2006  . Dysuria 04/15/2010  . Failure to thrive in childhood 04/06/2010  . FATIGUE 04/15/2010  . FIBROMYALGIA 05/10/2009  . FRACTURE, PELVIS, RIGHT 04/15/2010  . GASTROENTERITIS, ACUTE 12/25/2008  . GERD 09/05/2006  . HYPERLIPIDEMIA 09/05/2006  . HYPERTENSION 09/05/2006  . KNEE PAIN, LEFT 02/04/2010  . OSTEOARTHRITIS 09/05/2006  . OSTEOPOROSIS 07/31/2007  . Other specified forms of hearing loss 11/01/2009  . SINUSITIS- ACUTE-NOS 10/09/2007  . SKIN LESION 05/10/2009  . SPRAIN&STRAIN OTH SPEC SITES SHOULDER&UPPER ARM 11/01/2009  . Unspecified hearing loss 10/09/2007  . URI 02/20/2007  . UTI 02/20/2007  . Blood transfusion   . Cataract     resolved -surgery done  . Heart murmur   . DIABETES MELLITUS, TYPE II 07/31/2007    no meds    Surgeries: Procedure(s): LEFT TOTAL KNEE ARTHROPLASTY on 12/20/2012   Consultants:    Discharged Condition: Improved  Hospital Course: Katherine [name] is an 67 y.o. female who was admitted 12/20/2012 for operative treatment ofArthritis of knee, left. Patient has severe unremitting pain that affects sleep, daily activities, and work/hobbies. After pre-op clearance the patient was taken to the operating room on 12/20/2012 and underwent  Procedure(s): LEFT TOTAL KNEE ARTHROPLASTY.    Patient was given perioperative antibiotics: Anti-infectives   Start     Dose/Rate Route Frequency Ordered Stop   12/20/12 1400  clindamycin (CLEOCIN) IVPB 600 mg     600 mg 100 mL/hr over 30 Minutes Intravenous Every 6 hours 12/20/12 1132  12/20/12 1954   12/20/12 0550  clindamycin (CLEOCIN) IVPB 900 mg     900 mg 100 mL/hr over 30 Minutes Intravenous On call to O.R. 12/20/12 0550 12/20/12 0735       Patient was given sequential compression devices, early ambulation, and chemoprophylaxis to prevent DVT.  Patient benefited maximally from hospital stay and there were no complications.  She did receive 2 units of PRBCs prior to discharge due to symptomatic acute blood loss anemia.  Recent vital signs: Patient Vitals for the past 24 hrs:  BP Temp Temp src Pulse Resp SpO2  12/23/12 0533 130/82 mmHg 98.5 F (36.9 C) Oral 85 16 96 %  12/22/12 2242 115/76 mmHg 99.7 F (37.6 C) Oral 104 16 95 %  12/22/12 1400 131/83 mmHg 98.4 F (36.9 C) Oral 103 16 97 %  12/22/12 1141 - - - - 16 98 %  12/22/12 0743 - - - - 16 98 %     Recent laboratory studies:  Recent Labs  12/21/12 0430 12/22/12 0455 12/23/12 0408  WBC 12.8* 11.8* 10.7*  HGB 9.5* 9.0* 7.4*  HCT 27.9* 27.0* 22.1*  PLT 197 243 192  NA 133*  --   --   K 4.4  --   --   CL 100  --   --   CO2 24  --   --   BUN 17  --   --   CREATININE 0.60  --   --   GLUCOSE 122*  --   --   CALCIUM 8.0*  --   --  Discharge Medications:     Medication List    STOP taking these medications       aspirin 81 MG tablet  Replaced by:  aspirin 325 MG EC tablet     traMADol 200 MG 24 hr tablet  Commonly known as:  ULTRAM-ER      TAKE these medications       amLODipine 5 MG tablet  Commonly known as:  NORVASC  Take 5 mg by mouth every morning.     aspirin 325 MG EC tablet  Take 1 tablet (325 mg total) by mouth 2 (two) times daily after a meal.     atorvastatin 40 MG tablet  Commonly known as:  LIPITOR  Take 40 mg by mouth every morning.     azelastine 137 MCG/SPRAY nasal spray  Commonly known as:  ASTELIN  Place 1 spray into both nostrils 2 (two) times daily. Use in each nostril as directed     benztropine 0.5 MG tablet  Commonly known as:  COGENTIN  Take 0.5  mg by mouth 2 (two) times daily.     cetirizine 10 MG tablet  Commonly known as:  ZYRTEC  Take 10 mg by mouth daily.     doxazosin 8 MG tablet  Commonly known as:  CARDURA  Take 8 mg by mouth every morning.     etodolac 400 MG tablet  Commonly known as:  LODINE  Take 400 mg by mouth 2 (two) times daily.     fluticasone 50 MCG/ACT nasal spray  Commonly known as:  FLONASE  Place 2 sprays into both nostrils daily.     ipratropium 0.06 % nasal spray  Commonly known as:  ATROVENT  Place 1 spray into both nostrils 3 (three) times daily.     methocarbamol 500 MG tablet  Commonly known as:  ROBAXIN  Take 1 tablet (500 mg total) by mouth 2 (two) times daily.     mometasone 50 MCG/ACT nasal spray  Commonly known as:  NASONEX  Place 2 sprays into the nose daily.     multivitamin with minerals Tabs tablet  Take 1 tablet by mouth daily.     omeprazole 20 MG capsule  Commonly known as:  PRILOSEC  Take 20 mg by mouth daily.     oxyCODONE-acetaminophen 5-325 MG per tablet  Commonly known as:  ROXICET  Take 1-2 tablets by mouth every 4 (four) hours as needed for severe pain.     pregabalin 75 MG capsule  Commonly known as:  LYRICA  Take 75 mg by mouth 2 (two) times daily.     promethazine 12.5 MG tablet  Commonly known as:  PHENERGAN  Take 12.5 mg by mouth every 6 (six) hours as needed for nausea or vomiting.     risperiDONE 3 MG tablet  Commonly known as:  RISPERDAL  Take 3 mg by mouth at bedtime.     tiZANidine 4 MG tablet  Commonly known as:  ZANAFLEX  Take 4 mg by mouth at bedtime.        Diagnostic Studies: Dg Chest 2 View  12/17/2012   CLINICAL DATA:  Left total knee arthroplasty. Preop chest radiograph. History of a heart murmur.  EXAM: CHEST  2 VIEW  COMPARISON:  02/21/2010  FINDINGS: Cardiac silhouette is normal in size. Mediastinum is normal in contour. No hilar masses.  The lungs are clear. There is a marked compression fracture at the thoracolumbar junction,  which appears chronic the bony thorax is demineralized but  otherwise intact.  IMPRESSION: No acute cardiopulmonary disease.  Compression fracture at the thoracolumbar junction, most likely chronic. It was not present are lateral chest radiograph dated 07/20/2008, however.   Electronically Signed   By: Amie Portland M.D.   On: 12/17/2012 15:15   US Soft Tissue Head/neck  11/26/2012   CLINICAL DATA:  Thyroid nodule palpated on physical examination.  EXAM: THYROID ULTRASOUND  TECHNIQUE: Ultrasound examination of the thyroid gland and adjacent soft tissues was performed.  COMPARISON:  None.  FINDINGS: Right thyroid lobe  Measurements: 4.3 x 2.2 x 2.2 cm. A partially solid and partially cystic nodule in the superior right lobe measures approximately 1.7 x 1.0 x 1.5 cm. This nodule is noncalcified. A smaller superior right solid nodule measures 1.1 x 0.6 x 0.7 cm. This nodule is noncalcified.  Left thyroid lobe  Measurements: 4.7 x 1.9 x 1.7 cm. Tiny benign cystic area measures 0.3 cm.  Isthmus  Thickness: 0.3 cm.  No nodules visualized.  Lymphadenopathy  None visualized.  IMPRESSION: Two separate right thyroid nodules, as above, which have relatively benign characteristics. These can be followed by ultrasound rather than committing to biopsy at this time. Recommend followup thyroid ultrasound in 6-12 months   Electronically Signed   By: Irish Lack M.D.   On: 11/26/2012 11:27   Dg Knee Left Port  12/20/2012   CLINICAL DATA:  Status post arthroplasty  EXAM: PORTABLE LEFT KNEE - 1-2 VIEW  COMPARISON:  None.  FINDINGS: Frontal and lateral views were obtained. The patient has had a total knee replacement with femoral and tibial prosthetic components appearing well-seated. There is a drain anterior to the proximal tibial component. There is no fracture or dislocation. Soft tissue air is an expected postoperative finding.  IMPRESSION: Prosthetic components appear well seated. No fracture or dislocation.    Electronically Signed   By: Bretta Bang M.D.   On: 12/20/2012 10:26    Disposition: to SNF      Discharge Orders   Future Appointments Provider Department Dept Phone   05/09/2013 10:15 AM Corwin Levins, MD [facility] [phone]   Future Orders Complete By Expires   Call MD / Call 911  As directed    Comments:     If you experience chest pain or shortness of breath, CALL 911 and be transported to the hospital emergency room.  If you develope a fever above 101 F, pus (white drainage) or increased drainage or redness at the wound, or calf pain, call your surgeon's office.   Constipation Prevention  As directed    Comments:     Drink plenty of fluids.  Prune juice may be helpful.  You may use a stool softener, such as Colace (over the counter) 100 mg twice a day.  Use MiraLax (over the counter) for constipation as needed.   Diet - low sodium heart healthy  As directed    Discharge instructions  As directed    Comments:     Full weight as tolerated left knee. Work on left knee range-of-motion. Leave current dressing in place until her follow-up at [facility]. Can get current dressing wet in the shower.   Discharge patient  As directed    Discharge wound care:  As directed    Comments:     Keep dressing clean and intact. May shower with dressing intact . Remove dressing Thursday and shower. After showering apply clean dressing.   Increase activity slowly as tolerated  As  directed    Weight bearing as tolerated  As directed    Questions:     Laterality:     Extremity:        Follow-up Information   Follow up with Kathryne Hitch, MD. Schedule an appointment as soon as possible for a visit in 2 weeks.   Specialty:  Orthopedic Surgery   Contact information:   [address] [place] [address] [phone]       Follow up with Kathryne Hitch, MD In 2 weeks.   Specialty:  Orthopedic Surgery   Contact information:   [address] [place] [address] [phone]        Signed: Kathryne Hitch 12/23/2012, 7:24 AM

## 2012-12-23 NOTE — Progress Notes (Signed)
Physical Therapy Treatment Patient Details Name: COLBIE SLIKER MRN: [id number] DOB: 09/02/1945 Today's Date: 12/23/2012 Time: 4098-1191 PT Time Calculation (min): 10 min  PT Assessment / Plan / Recommendation  History of Present Illness LTKR   PT Comments   *Pt receiving 1st of 2 units of blood, gait deferred until 1st unit complete per RN request. Performed L TKA exercises with assist. **  Follow Up Recommendations  SNF     Does the patient have the potential to tolerate intense rehabilitation     Barriers to Discharge        Equipment Recommendations  Rolling walker with 5" wheels    Recommendations for Other Services OT consult  Frequency 7X/week   Progress towards PT Goals Progress towards PT goals: Progressing toward goals  Plan Current plan remains appropriate    Precautions / Restrictions Precautions Precautions: Knee;Fall Required Braces or Orthoses: Knee Immobilizer - Left Knee Immobilizer - Left: Discontinue once straight leg raise with < 10 degree lag Restrictions Weight Bearing Restrictions: No Other Position/Activity Restrictions: WBAT   Pertinent Vitals/Pain *5/10 L knee Premedicated, ice applied**    Mobility       Exercises Total Joint Exercises Ankle Circles/Pumps: AROM;10 reps;Supine;Both Quad Sets: AROM;Both;10 reps;Supine Towel Squeeze: AROM;Both;10 reps Short Arc QuadBarbaraann Boys;Left;Supine;20 reps Heel Slides: AAROM;Supine;Left;10 reps Hip ABduction/ADduction: AAROM;Left;10 reps Straight Leg Raises: AAROM;Left;10 reps;Supine   PT Diagnosis:    PT Problem List:   PT Treatment Interventions:     PT Goals (current goals can now be found in the care plan section) Acute Rehab PT Goals Patient Stated Goal: Rehab and home to resume previous lifestyle with decreased pain PT Goal Formulation: With patient Time For Goal Achievement: 12/27/12 Potential to Achieve Goals: Good  Visit Information  Last PT Received On: 12/23/12 Assistance Needed:  +1 History of Present Illness: LTKR    Subjective Data  Patient Stated Goal: Rehab and home to resume previous lifestyle with decreased pain   Cognition  Cognition Arousal/Alertness: Awake/alert Behavior During Therapy: WFL for tasks assessed/performed Overall Cognitive Status: Within Functional Limits for tasks assessed    Balance     End of Session PT - End of Session Activity Tolerance: Patient tolerated treatment well Patient left: with call bell/phone within reach;in bed Nurse Communication: Mobility status   GP     Ralene Bathe Kistler 12/23/2012, 11:48 AM [phone]

## 2012-12-23 NOTE — Care Management Note (Signed)
    Page 1 of 1   12/23/2012     12:21:01 PM   CARE MANAGEMENT NOTE 12/23/2012  Patient:  Katherine [name], Katherine [name]   Account Number:  [id number]  Date Initiated:  12/21/2012  Documentation initiated by:  [facility]  Subjective/Objective Assessment:   LEFT TOTAL KNEE ARTHROPLASTY (Left)     Action/Plan:   SNF vs HH   Anticipated DC Date:  12/23/2012   Anticipated DC Plan:  HOME W HOME HEALTH SERVICES  In-house referral  Clinical Social Worker      DC Planning Services  CM consult      Choice offered to / List presented to:             Status of service:  Completed, signed off Medicare Important Message given?  NA - LOS <3 / Initial given by admissions (If response is "NO", the following Medicare IM given date fields will be blank) Date Medicare IM given:   Date Additional Medicare IM given:    Discharge Disposition:  SKILLED NURSING FACILITY  Per UR Regulation:  Reviewed for med. necessity/level of care/duration of stay  If discussed at Long Length of Stay Meetings, dates discussed:    Comments:

## 2012-12-23 NOTE — Progress Notes (Signed)
Blood transfusion has stopped due to infiltration of IV. Right wrist and hand swollen and painful. Heat compress applied. IV team notified to start new IV.

## 2012-12-23 NOTE — Progress Notes (Addendum)
Physical Therapy Treatment Patient Details Name: SEMIYAH NEWGENT MRN: [id number] DOB: July 18, 1945 Today's Date: 12/23/2012 Time: 1308-6578 PT Time Calculation (min): 26 min  PT Assessment / Plan / Recommendation  History of Present Illness LTKR   PT Comments   **Progressing well, increased gait distance today. REady to DC from PT standpoint.*  Follow Up Recommendations  SNF     Does the patient have the potential to tolerate intense rehabilitation     Barriers to Discharge        Equipment Recommendations  Rolling walker with 5" wheels    Recommendations for Other Services OT consult  Frequency 7X/week   Progress towards PT Goals Progress towards PT goals: Progressing toward goals  Plan Current plan remains appropriate    Precautions / Restrictions Precautions Precautions: Knee;Fall Required Braces or Orthoses: Knee Immobilizer - Left Knee Immobilizer - Left: Discontinue once straight leg raise with < 10 degree lag Restrictions Weight Bearing Restrictions: No Other Position/Activity Restrictions: WBAT   Pertinent Vitals/Pain **7/10 L knee Premedicated, ice applied*    Mobility  Bed Mobility Bed Mobility: Supine to Sit Supine to Sit: 4: Min assist Supine to Sit: Patient Percentage: 80% Details for Bed Mobility Assistance: cues for sequence and use of R LE to self assist Transfers Sit to Stand: With upper extremity assist;From bed;4: Min assist;From chair/3-in-1 Stand to Sit: With upper extremity assist;To chair/3-in-1;4: Min assist Stand to Sit: Patient Percentage: 80% Details for Transfer Assistance: cues for LE management and use of UEs to self assist Ambulation/Gait Ambulation/Gait Assistance: 4: Min guard Ambulation Distance (Feet): 140 Feet Assistive device: Rolling walker Gait Pattern: Step-to pattern;Decreased step length - right;Decreased step length - left General Gait Details: min/guard for safety Stairs: No        PT Diagnosis:    PT Problem  List:   PT Treatment Interventions:     PT Goals (current goals can now be found in the care plan section) Acute Rehab PT Goals Patient Stated Goal: Rehab and home to resume previous lifestyle with decreased pain PT Goal Formulation: With patient Time For Goal Achievement: 12/27/12 Potential to Achieve Goals: Good  Visit Information  Last PT Received On: 12/23/12 Assistance Needed: +1 History of Present Illness: LTKR    Subjective Data  Patient Stated Goal: Rehab and home to resume previous lifestyle with decreased pain   Cognition  Cognition Arousal/Alertness: Awake/alert Behavior During Therapy: WFL for tasks assessed/performed Overall Cognitive Status: Within Functional Limits for tasks assessed    Balance     End of Session PT - End of Session Equipment Utilized During Treatment: Gait belt;Left knee immobilizer Activity Tolerance: Patient tolerated treatment well Patient left: with call bell/phone within reach;in chair Nurse Communication: Mobility status;Other (comment) (bloody drainage from drain site)   GP     Ralene Bathe Kistler 12/23/2012, 1:08 PM [phone]

## 2012-12-24 LAB — TYPE AND SCREEN: Unit division: 0

## 2013-01-02 ENCOUNTER — Inpatient Hospital Stay (HOSPITAL_COMMUNITY)
Admission: AD | Admit: 2013-01-02 | Discharge: 2013-01-08 | DRG: 486 | Disposition: A | Discharge status: Skilled Nursing Facility | Payer: PRIVATE HEALTH INSURANCE | Source: Ambulatory Visit | Attending: Orthopaedic Surgery | Admitting: Orthopaedic Surgery

## 2013-01-02 ENCOUNTER — Encounter (HOSPITAL_COMMUNITY): Payer: Self-pay | Admitting: Orthopedic Surgery

## 2013-01-02 DIAGNOSIS — M1990 Unspecified osteoarthritis, unspecified site: Secondary | ICD-10-CM | POA: Diagnosis present

## 2013-01-02 DIAGNOSIS — E785 Hyperlipidemia, unspecified: Secondary | ICD-10-CM | POA: Diagnosis present

## 2013-01-02 DIAGNOSIS — Z7982 Long term (current) use of aspirin: Secondary | ICD-10-CM

## 2013-01-02 DIAGNOSIS — I10 Essential (primary) hypertension: Secondary | ICD-10-CM | POA: Diagnosis present

## 2013-01-02 DIAGNOSIS — D649 Anemia, unspecified: Secondary | ICD-10-CM | POA: Diagnosis present

## 2013-01-02 DIAGNOSIS — Y831 Surgical operation with implant of artificial internal device as the cause of abnormal reaction of the patient, or of later complication, without mention of misadventure at the time of the procedure: Secondary | ICD-10-CM | POA: Diagnosis present

## 2013-01-02 DIAGNOSIS — D62 Acute posthemorrhagic anemia: Secondary | ICD-10-CM | POA: Diagnosis not present

## 2013-01-02 DIAGNOSIS — E119 Type 2 diabetes mellitus without complications: Secondary | ICD-10-CM | POA: Diagnosis present

## 2013-01-02 DIAGNOSIS — A4902 Methicillin resistant Staphylococcus aureus infection, unspecified site: Secondary | ICD-10-CM | POA: Diagnosis present

## 2013-01-02 DIAGNOSIS — T8459XD Infection and inflammatory reaction due to other internal joint prosthesis, subsequent encounter: Secondary | ICD-10-CM

## 2013-01-02 DIAGNOSIS — T8454XA Infection and inflammatory reaction due to internal left knee prosthesis, initial encounter: Secondary | ICD-10-CM

## 2013-01-02 DIAGNOSIS — Z880 Allergy status to penicillin: Secondary | ICD-10-CM

## 2013-01-02 DIAGNOSIS — IMO0001 Reserved for inherently not codable concepts without codable children: Secondary | ICD-10-CM | POA: Diagnosis present

## 2013-01-02 DIAGNOSIS — T8459XA Infection and inflammatory reaction due to other internal joint prosthesis, initial encounter: Secondary | ICD-10-CM

## 2013-01-02 DIAGNOSIS — K219 Gastro-esophageal reflux disease without esophagitis: Secondary | ICD-10-CM | POA: Diagnosis present

## 2013-01-02 DIAGNOSIS — Z79899 Other long term (current) drug therapy: Secondary | ICD-10-CM

## 2013-01-02 DIAGNOSIS — Z96659 Presence of unspecified artificial knee joint: Secondary | ICD-10-CM

## 2013-01-02 DIAGNOSIS — H9190 Unspecified hearing loss, unspecified ear: Secondary | ICD-10-CM | POA: Diagnosis present

## 2013-01-02 DIAGNOSIS — Z833 Family history of diabetes mellitus: Secondary | ICD-10-CM

## 2013-01-02 DIAGNOSIS — M1710 Unilateral primary osteoarthritis, unspecified knee: Secondary | ICD-10-CM | POA: Diagnosis present

## 2013-01-02 DIAGNOSIS — M810 Age-related osteoporosis without current pathological fracture: Secondary | ICD-10-CM | POA: Diagnosis present

## 2013-01-02 DIAGNOSIS — T8450XA Infection and inflammatory reaction due to unspecified internal joint prosthesis, initial encounter: Principal | ICD-10-CM | POA: Diagnosis present

## 2013-01-02 LAB — BASIC METABOLIC PANEL
BUN: 19 mg/dL (ref 6–23)
Calcium: 8.7 mg/dL (ref 8.4–10.5)
Chloride: 96 mEq/L (ref 96–112)
GFR calc Af Amer: >90 mL/min (ref >90)
Potassium: 3.7 mEq/L (ref 3.5–5.1)

## 2013-01-02 LAB — CBC
HCT: 30.6 % — ABNORMAL LOW (ref 36.0–46.0)
MCH: 29.6 pg (ref 26.0–34.0)
MCV: 89.7 fL (ref 78.0–100.0)
RDW: 14.2 % (ref 11.5–15.5)
WBC: 27.3 10*3/uL — ABNORMAL HIGH (ref 4.0–10.5)

## 2013-01-02 LAB — SEDIMENTATION RATE: Sed Rate: 75 mm/hr — ABNORMAL HIGH (ref 0–22)

## 2013-01-02 MED ORDER — BENZTROPINE MESYLATE 0.5 MG PO TABS
0.5000 mg | ORAL_TABLET | Freq: Two times a day (BID) | ORAL | Status: DC
  Administered 2013-01-02 – 2013-01-08 (×11): 0.5 mg via ORAL
  Filled 2013-01-02 (×13): qty 1

## 2013-01-02 MED ORDER — AMLODIPINE BESYLATE 5 MG PO TABS
5.0000 mg | ORAL_TABLET | Freq: Every morning | ORAL | Status: DC
  Administered 2013-01-04 – 2013-01-08 (×5): 5 mg via ORAL
  Filled 2013-01-02 (×6): qty 1

## 2013-01-02 MED ORDER — ADULT MULTIVITAMIN W/MINERALS CH
1.0000 | ORAL_TABLET | Freq: Every day | ORAL | Status: DC
  Administered 2013-01-04 – 2013-01-08 (×5): 1 via ORAL
  Filled 2013-01-02 (×6): qty 1

## 2013-01-02 MED ORDER — PANTOPRAZOLE SODIUM 40 MG PO TBEC
40.0000 mg | DELAYED_RELEASE_TABLET | Freq: Every day | ORAL | Status: DC
  Administered 2013-01-03 – 2013-01-08 (×6): 40 mg via ORAL
  Filled 2013-01-02 (×6): qty 1

## 2013-01-02 MED ORDER — LORATADINE 10 MG PO TABS
10.0000 mg | ORAL_TABLET | Freq: Every day | ORAL | Status: DC
  Administered 2013-01-04 – 2013-01-08 (×5): 10 mg via ORAL
  Filled 2013-01-02 (×6): qty 1

## 2013-01-02 MED ORDER — OXYCODONE HCL 5 MG PO TABS
5.0000 mg | ORAL_TABLET | ORAL | Status: DC | PRN
  Administered 2013-01-02 – 2013-01-03 (×4): 10 mg via ORAL
  Filled 2013-01-02 (×4): qty 2

## 2013-01-02 MED ORDER — AZELASTINE HCL 0.1 % NA SOLN
1.0000 | Freq: Two times a day (BID) | NASAL | Status: DC
  Administered 2013-01-02 – 2013-01-08 (×12): 1 via NASAL
  Filled 2013-01-02: qty 30

## 2013-01-02 MED ORDER — IPRATROPIUM BROMIDE 0.03 % NA SOLN
2.0000 | Freq: Three times a day (TID) | NASAL | Status: DC
  Administered 2013-01-02 – 2013-01-08 (×14): 2 via NASAL
  Filled 2013-01-02: qty 30

## 2013-01-02 MED ORDER — METHOCARBAMOL 500 MG PO TABS
500.0000 mg | ORAL_TABLET | Freq: Four times a day (QID) | ORAL | Status: DC | PRN
  Administered 2013-01-02 – 2013-01-03 (×2): 500 mg via ORAL
  Filled 2013-01-02 (×2): qty 1

## 2013-01-02 MED ORDER — SODIUM CHLORIDE 0.9 % IV SOLN
INTRAVENOUS | Status: DC
  Administered 2013-01-02: 22:00:00 via INTRAVENOUS

## 2013-01-02 MED ORDER — RISPERIDONE 3 MG PO TABS
3.0000 mg | ORAL_TABLET | Freq: Every day | ORAL | Status: DC
  Administered 2013-01-02 – 2013-01-07 (×6): 3 mg via ORAL
  Filled 2013-01-02 (×7): qty 1

## 2013-01-02 MED ORDER — PREGABALIN 75 MG PO CAPS
75.0000 mg | ORAL_CAPSULE | Freq: Two times a day (BID) | ORAL | Status: DC
  Administered 2013-01-02 – 2013-01-08 (×11): 75 mg via ORAL
  Filled 2013-01-02 (×11): qty 1

## 2013-01-02 MED ORDER — ONDANSETRON HCL 4 MG/2ML IJ SOLN: 4.0000 mg | Freq: Four times a day (QID) | INTRAMUSCULAR | Status: DC | PRN

## 2013-01-02 MED ORDER — IPRATROPIUM BROMIDE 0.06 % NA SOLN
1.0000 | Freq: Three times a day (TID) | NASAL | Status: DC
  Filled 2013-01-02: qty 15

## 2013-01-02 MED ORDER — FLUTICASONE PROPIONATE 50 MCG/ACT NA SUSP
2.0000 | Freq: Every day | NASAL | Status: DC
  Administered 2013-01-03 – 2013-01-08 (×7): 2 via NASAL
  Filled 2013-01-02: qty 16

## 2013-01-02 MED ORDER — ATORVASTATIN CALCIUM 40 MG PO TABS
40.0000 mg | ORAL_TABLET | Freq: Every morning | ORAL | Status: DC
  Administered 2013-01-04 – 2013-01-08 (×5): 40 mg via ORAL
  Filled 2013-01-02 (×7): qty 1

## 2013-01-02 MED ORDER — MORPHINE SULFATE 2 MG/ML IJ SOLN
2.0000 mg | INTRAMUSCULAR | Status: DC | PRN
Start: 2013-01-02 — End: 2013-01-03

## 2013-01-02 MED ORDER — DOXAZOSIN MESYLATE 8 MG PO TABS
8.0000 mg | ORAL_TABLET | Freq: Every morning | ORAL | Status: DC
  Administered 2013-01-04 – 2013-01-08 (×5): 8 mg via ORAL
  Filled 2013-01-02 (×6): qty 1

## 2013-01-02 NOTE — H&P (Signed)
Katherine [name] is an 67 y.o. female.   Chief Complaint:   Left knee drainage post TKA HPI: 67 yo female who is only 2 weeks post a left total knee replacement.  She has been staying at a skilled nursing facility to rehab her knee.  She awoke today with increased redness, swelling, and serous drainage form her left knee.  I saw her in the office this afternoon and decided to admit her tonight in anticipation for surgery tomorrow.  She understands this fully.  The goal is to wash out her knee, exchange her poly liner, and hopefully aggressively treat any infection in an effort to salvage her knee.  Past Medical History  Diagnosis Date  . ABDOMINAL PAIN, LOWER 02/04/2010  . ABNORMAL THYROID FUNCTION TESTS 08/04/2009  . ALLERGIC RHINITIS 09/05/2006  . ANXIETY 07/31/2007  . DEPRESSION 09/05/2006  . DIVERTICULOSIS, COLON 09/05/2006  . Dysuria 04/15/2010  . Failure to thrive in childhood 04/06/2010  . FATIGUE 04/15/2010  . FIBROMYALGIA 05/10/2009  . FRACTURE, PELVIS, RIGHT 04/15/2010  . GASTROENTERITIS, ACUTE 12/25/2008  . GERD 09/05/2006  . HYPERLIPIDEMIA 09/05/2006  . HYPERTENSION 09/05/2006  . KNEE PAIN, LEFT 02/04/2010  . OSTEOARTHRITIS 09/05/2006  . OSTEOPOROSIS 07/31/2007  . Other specified forms of hearing loss 11/01/2009  . SINUSITIS- ACUTE-NOS 10/09/2007  . SKIN LESION 05/10/2009  . SPRAIN&STRAIN OTH SPEC SITES SHOULDER&UPPER ARM 11/01/2009  . Unspecified hearing loss 10/09/2007  . URI 02/20/2007  . UTI 02/20/2007  . Blood transfusion   . Cataract     resolved -surgery done  . Heart murmur   . DIABETES MELLITUS, TYPE II 07/31/2007    no meds    Past Surgical History  Procedure Laterality Date  . Tonsillectomy    . Breast biopsy    . Abdominal hysterectomy  12/08    Bladder tac, partial hysterectomy  . Inguinal herniorrhapy      right Dr. Freida Busman 06/2009  . Catarct removal      both eyes  . Total knee arthroplasty Left 12/20/2012    Procedure: LEFT TOTAL KNEE ARTHROPLASTY;  Surgeon: Kathryne Hitch,  MD;  Location: [facility];  Service: Orthopedics;  Laterality: Left;    Family History  Problem Relation Age of Onset  . Coronary artery disease Other   . Diabetes Other   . Colon cancer Neg Hx   . Esophageal cancer Neg Hx   . Stomach cancer Neg Hx   . Rectal cancer Neg Hx    Social History:  reports that she has never smoked. She does not have any smokeless tobacco history on file. She reports that she does not drink alcohol or use illicit drugs.  Allergies:  Allergies  Allergen Reactions  . Penicillins     "made me nervous"  . Thioridazine Hcl Itching    Medications Prior to Admission  Medication Sig Dispense Refill  . amLODipine (NORVASC) 5 MG tablet Take 5 mg by mouth every morning.      [name] aspirin EC 325 MG EC tablet Take 1 tablet (325 mg total) by mouth 2 (two) times daily after a meal.  45 tablet  0  . atorvastatin (LIPITOR) 40 MG tablet Take 40 mg by mouth every morning.      [name] azelastine (ASTELIN) 137 MCG/SPRAY nasal spray Place 1 spray into both nostrils 2 (two) times daily. Use in each nostril as directed      . benztropine (COGENTIN) 0.5 MG tablet Take 0.5 mg by mouth 2 (two) times daily.        [name]  cetirizine (ZYRTEC) 10 MG tablet Take 10 mg by mouth daily.      [name] doxazosin (CARDURA) 8 MG tablet Take 8 mg by mouth every morning.      . etodolac (LODINE) 400 MG tablet Take 400 mg by mouth 2 (two) times daily.      . fluticasone (FLONASE) 50 MCG/ACT nasal spray Place 2 sprays into both nostrils daily.      [name] ipratropium (ATROVENT) 0.06 % nasal spray Place 1 spray into both nostrils 3 (three) times daily.      . methocarbamol (ROBAXIN) 500 MG tablet Take 1 tablet (500 mg total) by mouth 2 (two) times daily.  40 tablet  0  . mometasone (NASONEX) 50 MCG/ACT nasal spray Place 2 sprays into the nose daily.      . Multiple Vitamin (MULTIVITAMIN WITH MINERALS) TABS tablet Take 1 tablet by mouth daily.      [name] omeprazole (PRILOSEC) 20 MG capsule Take 20 mg by mouth daily.      [name]  oxyCODONE-acetaminophen (ROXICET) 5-325 MG per tablet Take 1-2 tablets by mouth every 4 (four) hours as needed for severe pain.  60 tablet  0  . pregabalin (LYRICA) 75 MG capsule Take 75 mg by mouth 2 (two) times daily.      . promethazine (PHENERGAN) 12.5 MG tablet Take 12.5 mg by mouth every 6 (six) hours as needed for nausea or vomiting.      . risperiDONE (RISPERDAL) 3 MG tablet Take 3 mg by mouth at bedtime.       [name] tiZANidine (ZANAFLEX) 4 MG tablet Take 4 mg by mouth at bedtime.        No results found for this or any previous visit (from the past 48 hour(s)). No results found.  Review of Systems  All other systems reviewed and are negative.    There were no vitals taken for this visit. Physical Exam  Constitutional: She is oriented to person, place, and time. She appears well-developed and well-nourished.  HENT:  Head: Normocephalic and atraumatic.  Eyes: EOM are normal. Pupils are equal, round, and reactive to light.  Neck: Normal range of motion. Neck supple.  Cardiovascular: Normal rate and regular rhythm.   Respiratory: Effort normal and breath sounds normal.  GI: Soft. Bowel sounds are normal.  Musculoskeletal:       Left knee: She exhibits swelling and ecchymosis. Tenderness found.       Legs: Neurological: She is alert and oriented to person, place, and time.  Skin: Skin is warm and dry.  Psychiatric: She has a normal mood and affect.     Assessment/Plan Left knee incision breakdown with possible infection status-post total knee replacement 1)  Admission as an inpatient tonight for preoperative labs, hydration, etc 2)  To the OR tomorrow for irrigation and debridement of her left knee with exchange of her poly-liner, cultures, and starting IV antibiotics. 3)  Will check labs tonight including CBC, ESR, CRP, and BMET 4)  NPO after midnight tonight; obtain consent  Kathryne Hitch 01/02/2013, 5:59 PM

## 2013-01-03 ENCOUNTER — Encounter (HOSPITAL_COMMUNITY): Payer: Self-pay | Admitting: Registered Nurse

## 2013-01-03 ENCOUNTER — Inpatient Hospital Stay (HOSPITAL_COMMUNITY): Payer: PRIVATE HEALTH INSURANCE | Admitting: Certified Registered Nurse Anesthetist

## 2013-01-03 ENCOUNTER — Encounter (HOSPITAL_COMMUNITY): Payer: PRIVATE HEALTH INSURANCE | Admitting: Certified Registered Nurse Anesthetist

## 2013-01-03 ENCOUNTER — Encounter (HOSPITAL_COMMUNITY)
Admission: AD | Disposition: A | Discharge status: Skilled Nursing Facility | Payer: Self-pay | Source: Ambulatory Visit | Attending: Orthopaedic Surgery

## 2013-01-03 HISTORY — PX: I & D KNEE WITH POLY EXCHANGE: SHX5024

## 2013-01-03 LAB — URINALYSIS, ROUTINE W REFLEX MICROSCOPIC
Glucose, UA: NEGATIVE mg/dL
Hgb urine dipstick: NEGATIVE
Ketones, ur: NEGATIVE mg/dL
Nitrite: NEGATIVE
Protein, ur: NEGATIVE mg/dL
Specific Gravity, Urine: 1.018 (ref 1.005–1.030)
Urobilinogen, UA: 1 mg/dL (ref 0.0–1.0)
pH: 6.5 (ref 5.0–8.0)

## 2013-01-03 LAB — GRAM STAIN

## 2013-01-03 LAB — URINE MICROSCOPIC-ADD ON

## 2013-01-03 LAB — C-REACTIVE PROTEIN: CRP: 30 mg/dL — ABNORMAL HIGH (ref <0.60)

## 2013-01-03 LAB — GLUCOSE, CAPILLARY

## 2013-01-03 SURGERY — IRRIGATION AND DEBRIDEMENT KNEE WITH POLY EXCHANGE
Anesthesia: General | Site: Knee | Laterality: Left

## 2013-01-03 MED ORDER — VANCOMYCIN HCL IN DEXTROSE 1-5 GM/200ML-% IV SOLN
1000.0000 mg | Freq: Once | INTRAVENOUS | Status: AC
  Administered 2013-01-03: 1000 mg via INTRAVENOUS

## 2013-01-03 MED ORDER — FENTANYL CITRATE 0.05 MG/ML IJ SOLN
INTRAMUSCULAR | Status: AC
  Filled 2013-01-03: qty 5

## 2013-01-03 MED ORDER — METOPROLOL TARTRATE 1 MG/ML IV SOLN
INTRAVENOUS | Status: AC
  Filled 2013-01-03: qty 5

## 2013-01-03 MED ORDER — LIDOCAINE HCL (CARDIAC) 20 MG/ML IV SOLN
INTRAVENOUS | Status: AC
  Filled 2013-01-03: qty 10

## 2013-01-03 MED ORDER — GLYCOPYRROLATE 0.2 MG/ML IJ SOLN
INTRAMUSCULAR | Status: AC
  Filled 2013-01-03: qty 2

## 2013-01-03 MED ORDER — 0.9 % SODIUM CHLORIDE (POUR BTL) OPTIME
TOPICAL | Status: DC | PRN
  Administered 2013-01-03: 1000 mL

## 2013-01-03 MED ORDER — TOBRAMYCIN SULFATE 1.2 G IJ SOLR
INTRAMUSCULAR | Status: DC | PRN
  Administered 2013-01-03 (×2): 1.2 g

## 2013-01-03 MED ORDER — DOCUSATE SODIUM 100 MG PO CAPS
100.0000 mg | ORAL_CAPSULE | Freq: Two times a day (BID) | ORAL | Status: DC
  Administered 2013-01-03 – 2013-01-08 (×9): 100 mg via ORAL
  Filled 2013-01-03 (×10): qty 1

## 2013-01-03 MED ORDER — ONDANSETRON HCL 4 MG/2ML IJ SOLN
INTRAMUSCULAR | Status: DC | PRN
  Administered 2013-01-03: 4 mg via INTRAVENOUS

## 2013-01-03 MED ORDER — SUCCINYLCHOLINE CHLORIDE 20 MG/ML IJ SOLN
INTRAMUSCULAR | Status: AC
  Filled 2013-01-03: qty 1

## 2013-01-03 MED ORDER — SODIUM CHLORIDE 0.9 % IV SOLN
1000.0000 mg | INTRAVENOUS | Status: DC | PRN
  Administered 2013-01-03: 1000 mg via INTRAVENOUS

## 2013-01-03 MED ORDER — VANCOMYCIN HCL IN DEXTROSE 1-5 GM/200ML-% IV SOLN
1000.0000 mg | Freq: Two times a day (BID) | INTRAVENOUS | Status: AC
  Administered 2013-01-04 – 2013-01-05 (×4): 1000 mg via INTRAVENOUS
  Filled 2013-01-03 (×4): qty 200

## 2013-01-03 MED ORDER — HYDROCODONE-ACETAMINOPHEN 5-325 MG PO TABS
1.0000 | ORAL_TABLET | ORAL | Status: DC | PRN
  Administered 2013-01-04 (×2): 1 via ORAL
  Administered 2013-01-06 – 2013-01-08 (×7): 2 via ORAL
  Filled 2013-01-03: qty 2
  Filled 2013-01-03: qty 1
  Filled 2013-01-03 (×6): qty 2
  Filled 2013-01-03: qty 1

## 2013-01-03 MED ORDER — HYDROMORPHONE HCL PF 2 MG/ML IJ SOLN
INTRAMUSCULAR | Status: AC
  Filled 2013-01-03: qty 1

## 2013-01-03 MED ORDER — HYDROMORPHONE HCL PF 1 MG/ML IJ SOLN
0.2500 mg | INTRAMUSCULAR | Status: DC | PRN
  Administered 2013-01-03 (×4): 0.5 mg via INTRAVENOUS

## 2013-01-03 MED ORDER — PROPOFOL 10 MG/ML IV BOLUS
INTRAVENOUS | Status: DC | PRN
  Administered 2013-01-03: 150 mg via INTRAVENOUS

## 2013-01-03 MED ORDER — GLYCOPYRROLATE 0.2 MG/ML IJ SOLN
INTRAMUSCULAR | Status: AC
  Filled 2013-01-03: qty 1

## 2013-01-03 MED ORDER — SODIUM CHLORIDE 0.9 % IR SOLN
Status: DC | PRN
  Administered 2013-01-03 (×2): 3000 mL

## 2013-01-03 MED ORDER — SODIUM CHLORIDE 0.9 % IV SOLN
INTRAVENOUS | Status: DC
  Administered 2013-01-03 – 2013-01-05 (×4): via INTRAVENOUS

## 2013-01-03 MED ORDER — METHOCARBAMOL 100 MG/ML IJ SOLN
500.0000 mg | Freq: Four times a day (QID) | INTRAMUSCULAR | Status: DC | PRN
  Filled 2013-01-03: qty 5

## 2013-01-03 MED ORDER — LACTATED RINGERS IV SOLN
INTRAVENOUS | Status: DC
  Administered 2013-01-03: 16:00:00 via INTRAVENOUS
  Administered 2013-01-03: 1000 mL via INTRAVENOUS

## 2013-01-03 MED ORDER — NEOSTIGMINE METHYLSULFATE 1 MG/ML IJ SOLN
INTRAMUSCULAR | Status: DC | PRN
  Administered 2013-01-03: 4 mg via INTRAVENOUS

## 2013-01-03 MED ORDER — HYDROMORPHONE HCL PF 1 MG/ML IJ SOLN
INTRAMUSCULAR | Status: DC | PRN
  Administered 2013-01-03 (×2): 0.5 mg via INTRAVENOUS

## 2013-01-03 MED ORDER — ROCURONIUM BROMIDE 100 MG/10ML IV SOLN
INTRAVENOUS | Status: DC | PRN
  Administered 2013-01-03: 30 mg via INTRAVENOUS
  Administered 2013-01-03: 10 mg via INTRAVENOUS

## 2013-01-03 MED ORDER — VANCOMYCIN HCL 1000 MG IV SOLR
1000.0000 mg | INTRAVENOUS | Status: DC
  Filled 2013-01-03: qty 1000

## 2013-01-03 MED ORDER — FENTANYL CITRATE 0.05 MG/ML IJ SOLN
INTRAMUSCULAR | Status: DC | PRN
Start: 2013-01-03 — End: 2013-01-03
  Administered 2013-01-03 (×3): 50 ug via INTRAVENOUS
  Administered 2013-01-03: 100 ug via INTRAVENOUS

## 2013-01-03 MED ORDER — VANCOMYCIN HCL IN DEXTROSE 1-5 GM/200ML-% IV SOLN
INTRAVENOUS | Status: AC
  Filled 2013-01-03: qty 200

## 2013-01-03 MED ORDER — TOBRAMYCIN SULFATE 1.2 G IJ SOLR
INTRAMUSCULAR | Status: AC
  Filled 2013-01-03: qty 2.4

## 2013-01-03 MED ORDER — LACTATED RINGERS IV SOLN
INTRAVENOUS | Status: DC
  Administered 2013-01-03: 19:00:00 via INTRAVENOUS

## 2013-01-03 MED ORDER — MORPHINE SULFATE 2 MG/ML IJ SOLN
1.0000 mg | INTRAMUSCULAR | Status: DC | PRN
  Administered 2013-01-04: 1 mg via INTRAVENOUS
  Filled 2013-01-03 (×2): qty 1

## 2013-01-03 MED ORDER — DIPHENHYDRAMINE HCL 12.5 MG/5ML PO ELIX: 12.5000 mg | ORAL_SOLUTION | ORAL | Status: DC | PRN

## 2013-01-03 MED ORDER — MIDAZOLAM HCL 2 MG/2ML IJ SOLN
INTRAMUSCULAR | Status: AC
  Filled 2013-01-03: qty 2

## 2013-01-03 MED ORDER — ZOLPIDEM TARTRATE 5 MG PO TABS: 5.0000 mg | ORAL_TABLET | Freq: Every evening | ORAL | Status: DC | PRN

## 2013-01-03 MED ORDER — ONDANSETRON HCL 4 MG/2ML IJ SOLN
INTRAMUSCULAR | Status: AC
  Filled 2013-01-03: qty 2

## 2013-01-03 MED ORDER — LIDOCAINE HCL (CARDIAC) 20 MG/ML IV SOLN
INTRAVENOUS | Status: DC | PRN
  Administered 2013-01-03: 80 mg via INTRAVENOUS

## 2013-01-03 MED ORDER — GLYCOPYRROLATE 0.2 MG/ML IJ SOLN
INTRAMUSCULAR | Status: DC | PRN
  Administered 2013-01-03: .6 mg via INTRAVENOUS

## 2013-01-03 MED ORDER — ONDANSETRON HCL 4 MG/2ML IJ SOLN: 4.0000 mg | Freq: Four times a day (QID) | INTRAMUSCULAR | Status: DC | PRN

## 2013-01-03 MED ORDER — HYDROMORPHONE HCL PF 1 MG/ML IJ SOLN
INTRAMUSCULAR | Status: AC
  Filled 2013-01-03: qty 1

## 2013-01-03 MED ORDER — POLYETHYLENE GLYCOL 3350 17 G PO PACK
17.0000 g | PACK | Freq: Every day | ORAL | Status: DC | PRN
  Filled 2013-01-03: qty 1

## 2013-01-03 MED ORDER — ONDANSETRON HCL 4 MG PO TABS: 4.0000 mg | ORAL_TABLET | Freq: Four times a day (QID) | ORAL | Status: DC | PRN

## 2013-01-03 MED ORDER — PROPOFOL 10 MG/ML IV BOLUS
INTRAVENOUS | Status: AC
  Filled 2013-01-03: qty 20

## 2013-01-03 MED ORDER — OXYCODONE HCL 5 MG PO TABS
5.0000 mg | ORAL_TABLET | ORAL | Status: DC | PRN
  Administered 2013-01-04: 20:00:00 5 mg via ORAL
  Administered 2013-01-05 (×2): 10 mg via ORAL
  Administered 2013-01-05: 5 mg via ORAL
  Administered 2013-01-05 – 2013-01-07 (×3): 10 mg via ORAL
  Administered 2013-01-08: 5 mg via ORAL
  Filled 2013-01-03: qty 2
  Filled 2013-01-03: qty 1
  Filled 2013-01-03 (×2): qty 2
  Filled 2013-01-03: qty 1
  Filled 2013-01-03 (×2): qty 2
  Filled 2013-01-03: qty 1

## 2013-01-03 MED ORDER — MIDAZOLAM HCL 5 MG/5ML IJ SOLN
INTRAMUSCULAR | Status: DC | PRN
  Administered 2013-01-03: 2 mg via INTRAVENOUS

## 2013-01-03 MED ORDER — METOCLOPRAMIDE HCL 5 MG/ML IJ SOLN: 5.0000 mg | Freq: Three times a day (TID) | INTRAMUSCULAR | Status: DC | PRN

## 2013-01-03 MED ORDER — METOPROLOL TARTRATE 1 MG/ML IV SOLN
2.5000 mg | INTRAVENOUS | Status: AC
  Administered 2013-01-03 (×3): 2.5 mg via INTRAVENOUS

## 2013-01-03 MED ORDER — METHOCARBAMOL 500 MG PO TABS
500.0000 mg | ORAL_TABLET | Freq: Four times a day (QID) | ORAL | Status: DC | PRN
  Administered 2013-01-07: 500 mg via ORAL
  Filled 2013-01-03: qty 1

## 2013-01-03 MED ORDER — METOCLOPRAMIDE HCL 10 MG PO TABS: 5.0000 mg | ORAL_TABLET | Freq: Three times a day (TID) | ORAL | Status: DC | PRN

## 2013-01-03 MED ORDER — NEOSTIGMINE METHYLSULFATE 1 MG/ML IJ SOLN
INTRAMUSCULAR | Status: AC
  Filled 2013-01-03: qty 10

## 2013-01-03 SURGICAL SUPPLY — 64 items
BAG SPEC THK2 15X12 ZIP CLS (MISCELLANEOUS) ×1
BAG ZIPLOCK 12X15 (MISCELLANEOUS) ×2 IMPLANT
BANDAGE ELASTIC 4 VELCRO ST LF (GAUZE/BANDAGES/DRESSINGS) ×2 IMPLANT
BANDAGE ELASTIC 6 VELCRO ST LF (GAUZE/BANDAGES/DRESSINGS) ×2 IMPLANT
BANDAGE ESMARK 6X9 LF (GAUZE/BANDAGES/DRESSINGS) ×1 IMPLANT
BLADE SURG 15 STRL LF DISP TIS (BLADE) IMPLANT
BLADE SURG 15 STRL SS (BLADE) ×2
BNDG CMPR 9X6 STRL LF SNTH (GAUZE/BANDAGES/DRESSINGS) ×1
BNDG ESMARK 6X9 LF (GAUZE/BANDAGES/DRESSINGS) ×2
CONT SPECI 4OZ STER CLIK (MISCELLANEOUS) ×2 IMPLANT
CUFF TOURN SGL QUICK 34 (TOURNIQUET CUFF) ×2
CUFF TRNQT CYL 34X4X40X1 (TOURNIQUET CUFF) ×1 IMPLANT
DECANTER SPIKE VIAL GLASS SM (MISCELLANEOUS) ×1 IMPLANT
DRAPE EXTREMITY T 121X128X90 (DRAPE) ×2 IMPLANT
DRAPE POUCH INSTRU U-SHP 10X18 (DRAPES) ×2 IMPLANT
DRAPE U-SHAPE 47X51 STRL (DRAPES) ×2 IMPLANT
DRSG ADAPTIC 3X8 NADH LF (GAUZE/BANDAGES/DRESSINGS) ×2 IMPLANT
ELECT REM PT RETURN 9FT ADLT (ELECTROSURGICAL) ×2
ELECTRODE REM PT RTRN 9FT ADLT (ELECTROSURGICAL) ×1 IMPLANT
EVACUATOR 1/8 PVC DRAIN (DRAIN) ×2 IMPLANT
EVACUATOR SILICONE 100CC (DRAIN) ×1 IMPLANT
FACESHIELD LNG OPTICON STERILE (SAFETY) ×10 IMPLANT
FLOSEAL 10ML (HEMOSTASIS) ×2 IMPLANT
GAUZE XEROFORM 5X9 LF (GAUZE/BANDAGES/DRESSINGS) ×1 IMPLANT
GLOVE BIOGEL PI IND STRL 8 (GLOVE) ×1 IMPLANT
GLOVE BIOGEL PI INDICATOR 8 (GLOVE) ×1
GLOVE ECLIPSE 8.0 STRL XLNG CF (GLOVE) ×2 IMPLANT
GLOVE ORTHO TXT STRL SZ7.5 (GLOVE) ×4 IMPLANT
GLOVE SURG ORTHO 8.0 STRL STRW (GLOVE) ×4 IMPLANT
GOWN STRL REIN XL XLG (GOWN DISPOSABLE) ×2 IMPLANT
HANDPIECE INTERPULSE COAX TIP (DISPOSABLE) ×2
IMMOBILIZER KNEE 20 (SOFTGOODS)
IMMOBILIZER KNEE 20 THIGH 36 (SOFTGOODS) IMPLANT
INSERT TIBIAL BEARING SZ 7X11 (Insert) ×1 IMPLANT
KIT BASIN OR (CUSTOM PROCEDURE TRAY) ×2 IMPLANT
KIT STIMULAN RAPID CURE  10CC (Orthopedic Implant) ×1 IMPLANT
KIT STIMULAN RAPID CURE 10CC (Orthopedic Implant) IMPLANT
NDL SAFETY ECLIPSE 18X1.5 (NEEDLE) ×1 IMPLANT
NEEDLE HYPO 18GX1.5 SHARP (NEEDLE) ×2
NS IRRIG 1000ML POUR BTL (IV SOLUTION) ×4 IMPLANT
PACK TOTAL JOINT (CUSTOM PROCEDURE TRAY) ×2 IMPLANT
PAD ABD 7.5X8 STRL (GAUZE/BANDAGES/DRESSINGS) ×2 IMPLANT
PAD ABD 8X10 STRL (GAUZE/BANDAGES/DRESSINGS) ×4 IMPLANT
PADDING CAST COTTON 6X4 STRL (CAST SUPPLIES) ×3 IMPLANT
POSITIONER SURGICAL ARM (MISCELLANEOUS) ×2 IMPLANT
SET HNDPC FAN SPRY TIP SCT (DISPOSABLE) ×1 IMPLANT
SPONGE GAUZE 4X4 12PLY (GAUZE/BANDAGES/DRESSINGS) ×3 IMPLANT
SPONGE LAP 18X18 X RAY DECT (DISPOSABLE) ×2 IMPLANT
STAPLER VISISTAT 35W (STAPLE) ×1 IMPLANT
STRIP CLOSURE SKIN 1/2X4 (GAUZE/BANDAGES/DRESSINGS) ×4 IMPLANT
SUCTION FRAZIER 12FR DISP (SUCTIONS) ×2 IMPLANT
SUT MNCRL AB 4-0 PS2 18 (SUTURE) ×2 IMPLANT
SUT VIC AB 1 CT1 36 (SUTURE) ×6 IMPLANT
SUT VIC AB 2-0 CT1 27 (SUTURE) ×8
SUT VIC AB 2-0 CT1 TAPERPNT 27 (SUTURE) ×3 IMPLANT
SWAB COLLECTION DEVICE MRSA (MISCELLANEOUS) ×2 IMPLANT
SYR 50ML LL SCALE MARK (SYRINGE) ×2 IMPLANT
SYR CONTROL 10ML LL (SYRINGE) ×2 IMPLANT
TOWEL OR 17X26 10 PK STRL BLUE (TOWEL DISPOSABLE) ×6 IMPLANT
TRAY FOLEY CATH 14FRSI W/METER (CATHETERS) ×2 IMPLANT
TRAY PREP A LATEX SAFE STRL (SET/KITS/TRAYS/PACK) ×1 IMPLANT
TUBE ANAEROBIC SPECIMEN COL (MISCELLANEOUS) ×2 IMPLANT
WATER STERILE IRR 1500ML POUR (IV SOLUTION) ×2 IMPLANT
YANKAUER SUCT BULB TIP NO VENT (SUCTIONS) ×1 IMPLANT

## 2013-01-03 NOTE — Anesthesia Preprocedure Evaluation (Addendum)
Anesthesia Evaluation  Patient identified by MRN, date of birth, ID band Patient awake    Reviewed: Allergy & Precautions, H&P , NPO status , Patient's Chart, lab work & pertinent test results  Airway Mallampati: II TM Distance: >3 FB Neck ROM: full    Dental no notable dental hx. (+) Teeth Intact and Dental Advisory Given   Pulmonary neg pulmonary ROS,  breath sounds clear to auscultation  Pulmonary exam normal       Cardiovascular Exercise Tolerance: Good hypertension, Pt. on medications Rhythm:regular Rate:Normal     Neuro/Psych negative neurological ROS  negative psych ROS   GI/Hepatic negative GI ROS, Neg liver ROS,   Endo/Other  diabetes, Well Controlled, Type 2Diet controlled DM  Renal/GU negative Renal ROS  negative genitourinary   Musculoskeletal   Abdominal   Peds  Hematology negative hematology ROS (+) anemia , hgb 10.1   Anesthesia Other Findings   Reproductive/Obstetrics negative OB ROS                         Anesthesia Physical Anesthesia Plan  ASA: III  Anesthesia Plan: General   Post-op Pain Management:    Induction: Intravenous  Airway Management Planned: Oral ETT  Additional Equipment:   Intra-op Plan:   Post-operative Plan: Extubation in OR  Informed Consent: I have reviewed the patients History and Physical, chart, labs and discussed the procedure including the risks, benefits and alternatives for the proposed anesthesia with the patient or authorized representative who has indicated his/her understanding and acceptance.   Dental Advisory Given  Plan Discussed with: CRNA and Surgeon  Anesthesia Plan Comments:         Anesthesia Quick Evaluation

## 2013-01-03 NOTE — Progress Notes (Signed)
ANTIBIOTIC CONSULT NOTE - INITIAL  Pharmacy Consult for vancomycin Indication: left knee infection post TKA  Allergies  Allergen Reactions  . Penicillins     "made me nervous"  . Thioridazine Hcl Itching    Patient Measurements: Height: 4\' 11"  (149.9 cm) Weight: 164 lb (74.39 kg) IBW/kg (Calculated) : 43.2  Vital Signs: Temp: 100.9 F (38.3 C) (12/05 1927) BP: 154/84 mmHg (12/05 1927) Pulse Rate: 105 (12/05 1927) Intake/Output from previous day: 12/04 0701 - 12/05 0700 In: 598.8 [I.V.:598.8] Out: 1200 [Urine:1200] Intake/Output from this shift: Total I/O In: -  Out: 305 [Urine:225; Drains:80]  Labs:  Recent Labs  01/02/13 1859  WBC 27.3*  HGB 10.1*  PLT 511*  CREATININE 0.59   Estimated Creatinine Clearance: 60 ml/min (by C-G formula based on Cr of 0.59). No results found for this basename: VANCOTROUGH, Leodis Binet, VANCORANDOM, GENTTROUGH, GENTPEAK, GENTRANDOM, TOBRATROUGH, TOBRAPEAK, TOBRARND, AMIKACINPEAK, AMIKACINTROU, AMIKACIN,  in the last 72 hours   Microbiology: Recent Results (from the past 720 hour(s))  URINE CULTURE     Status: None   Collection Time    12/17/12 11:25 AM      Result Value Range Status   Specimen Description URINE, CLEAN CATCH   Final   Special Requests NONE   Final   Culture  Setup Time     Final   Value: 12/18/2012 00:53     Performed at [facility] Count     Final   Value: 50,000 COLONIES/ML     Performed at [facility]   Culture     Final   Value: Multiple bacterial morphotypes present, none predominant. Suggest appropriate recollection if clinically indicated.     Performed at [facility]   Report Status 12/18/2012 FINAL   Final  SURGICAL PCR SCREEN     Status: None   Collection Time    12/17/12 11:45 AM      Result Value Range Status   MRSA, PCR NEGATIVE  NEGATIVE Final   Staphylococcus aureus NEGATIVE  NEGATIVE Final   Comment:            The Xpert SA Assay (FDA     approved for  NASAL specimens     in patients over 22 years of age),     is one component of     a comprehensive surveillance     program.  Test performance has     been validated by [employer] for patients greater     than or equal to 66 year old.     It is not intended     to diagnose infection nor to     guide or monitor treatment.  GRAM STAIN     Status: None   Collection Time    01/03/13  3:40 PM      Result Value Range Status   Specimen Description SYNOVIAL LEFT KNEE SUPERFICIAL   Final   Special Requests NONE   Final   Gram Stain     Final   Value: NO WBC SEEN     RARE GRAM POSITIVE COCCI IN PAIRS     Gram Stain Report Called to,Read Back By and Verified With: PALIN,T. AT 1728 ON LOVE,T.   Report Status 01/03/2013 FINAL   Final  GRAM STAIN     Status: None   Collection Time    01/03/13  3:44 PM      Result Value Range Status   Specimen  Description SYNOVIAL LEFT KNEE DEEP   Final   Special Requests NONE   Final   Gram Stain     Final   Value: RARE WBC PRESENT, PREDOMINANTLY PMN     FEW GRAM POSITIVE COCCI IN PAIRS     Gram Stain Report Called to,Read Back By and Verified With: PALIN,T. AT 1728 ON 12.05.14 BY LOVE,T.   Report Status 01/03/2013 FINAL   Final    Medical History: Past Medical History  Diagnosis Date  . ABDOMINAL PAIN, LOWER 02/04/2010  . ABNORMAL THYROID FUNCTION TESTS 08/04/2009  . ALLERGIC RHINITIS 09/05/2006  . ANXIETY 07/31/2007  . DEPRESSION 09/05/2006  . DIVERTICULOSIS, COLON 09/05/2006  . Dysuria 04/15/2010  . Failure to thrive in childhood 04/06/2010  . FATIGUE 04/15/2010  . FIBROMYALGIA 05/10/2009  . FRACTURE, PELVIS, RIGHT 04/15/2010  . GASTROENTERITIS, ACUTE 12/25/2008  . GERD 09/05/2006  . HYPERLIPIDEMIA 09/05/2006  . HYPERTENSION 09/05/2006  . KNEE PAIN, LEFT 02/04/2010  . OSTEOARTHRITIS 09/05/2006  . OSTEOPOROSIS 07/31/2007  . Other specified forms of hearing loss 11/01/2009  . SINUSITIS- ACUTE-NOS 10/09/2007  . SKIN LESION 05/10/2009  . SPRAIN&STRAIN OTH SPEC SITES  SHOULDER&UPPER ARM 11/01/2009  . Unspecified hearing loss 10/09/2007  . URI 02/20/2007  . UTI 02/20/2007  . Blood transfusion   . Cataract     resolved -surgery done  . Heart murmur   . DIABETES MELLITUS, TYPE II 07/31/2007    no meds    Medications:  Scheduled:  . amLODipine  5 mg Oral q morning - 10a  . atorvastatin  40 mg Oral q morning - 10a  . azelastine  1 spray Each Nare BID  . benztropine  0.5 mg Oral BID  . docusate sodium  100 mg Oral BID  . doxazosin  8 mg Oral q morning - 10a  . fluticasone  2 spray Each Nare Daily  . HYDROmorphone      . HYDROmorphone      . ipratropium  2 spray Each Nare TID  . loratadine  10 mg Oral Daily  . metoprolol      . metoprolol      . multivitamin with minerals  1 tablet Oral Daily  . pantoprazole  40 mg Oral Daily  . pregabalin  75 mg Oral BID  . risperiDONE  3 mg Oral QHS   Infusions:  . sodium chloride     Assessment: 67 yo female s/p irrigation and debridement left knee with poly exchange after left knee infection post TKA to start vancomycin per pharmacy dosing post op. Note pre-op vanc dose of 1g given around 1600  Goal of Therapy:  Vancomycin trough level 15-20 mcg/ml  Plan:  1) Vancomycin 1g IV q12 2) vanc trough at steady state  Hessie Knows, PharmD, BCPS Pager [phone] 01/03/2013 7:37 PM

## 2013-01-03 NOTE — Anesthesia Postprocedure Evaluation (Signed)
  Anesthesia Post-op Note  Patient: Katherine [name]  Procedure(s) Performed: Procedure(s) (LRB): IRRIGATION AND DEBRIDEMENT LEFT KNEE WITH POLY EXCHANGE (Left)  Patient Location: PACU  Anesthesia Type: General  Level of Consciousness: awake and alert   Airway and Oxygen Therapy: Patient Spontanous Breathing  Post-op Pain: mild  Post-op Assessment: Post-op Vital signs reviewed, Patient's Cardiovascular Status Stable, Respiratory Function Stable, Patent Airway and No signs of Nausea or vomiting  Last Vitals:  Filed Vitals:   01/03/13 1815  BP: 162/80  Pulse: 105  Temp: 37.6 C  Resp: 19    Post-op Vital Signs: stable   Complications: No apparent anesthesia complications

## 2013-01-03 NOTE — Progress Notes (Signed)
Patient ID: Katherine [name], female   DOB: 12/03/1945, 67 y.o.   MRN: [id number] Katherine [name]'s left total knee is concerning for a post-operative infection given the drainage, high WBC, elevated ESR, and high CR-P.  She understands fully that we will proceed to the OR today for an irrigation and debridement, cultures, and poly exchange.  He will hold on antibiotics until cultures are obtained.

## 2013-01-03 NOTE — Preoperative (Signed)
Beta Blockers   Reason not to administer Beta Blockers:Not Applicable 

## 2013-01-03 NOTE — Transfer of Care (Signed)
Immediate Anesthesia Transfer of Care Note  Patient: Katherine [name]  Procedure(s) Performed: Procedure(s): IRRIGATION AND DEBRIDEMENT LEFT KNEE WITH POLY EXCHANGE (Left)  Patient Location: PACU  Anesthesia Type:General  Level of Consciousness: awake, alert , oriented and patient cooperative  Airway & Oxygen Therapy: Patient Spontanous Breathing and Patient connected to face mask oxygen  Post-op Assessment: Report given to PACU RN, Post -op Vital signs reviewed and stable and Patient moving all extremities  Post vital signs: Reviewed and stable  Complications: No apparent anesthesia complications

## 2013-01-03 NOTE — Brief Op Note (Signed)
01/02/2013 - 01/03/2013  4:44 PM  PATIENT:  Katherine [name]  67 y.o. 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  PROCEDURE:  Procedure(s): IRRIGATION AND DEBRIDEMENT LEFT KNEE WITH POLY EXCHANGE (Left)  SURGEON:  Surgeon(s) and Role:    * Kathryne Hitch, MD - Primary  PHYSICIAN ASSISTANT: Rexene Edison, PA-C  ANESTHESIA:   general  EBL:  Total I/O In: 1000 [I.V.:1000] Out: 650 [Urine:650]  BLOOD ADMINISTERED:none  DRAINS: (medium) Hemovact drain(s) in the knee with  Suction Open   LOCAL MEDICATIONS USED:  NONE  SPECIMEN:  No Specimen  DISPOSITION OF SPECIMEN:  N/A  COUNTS:  YES  TOURNIQUET:   Total Tourniquet Time Documented: Thigh (Left) - 51 minutes Total: Thigh (Left) - 51 minutes   DICTATION: .Other Dictation: Dictation Number [id number]  PLAN OF CARE: Admit to inpatient   PATIENT DISPOSITION:  PACU - hemodynamically stable.   Delay start of Pharmacological VTE agent (>24hrs) due to surgical blood loss or risk of bleeding: no

## 2013-01-04 LAB — CBC
HCT: 24.7 % — ABNORMAL LOW (ref 36.0–46.0)
Hemoglobin: 8 g/dL — ABNORMAL LOW (ref 12.0–15.0)
MCHC: 32.4 g/dL (ref 30.0–36.0)
WBC: 18.1 10*3/uL — ABNORMAL HIGH (ref 4.0–10.5)

## 2013-01-04 LAB — URINE CULTURE: Colony Count: 55000

## 2013-01-04 LAB — GLUCOSE, CAPILLARY
Glucose-Capillary: 130 mg/dL — ABNORMAL HIGH (ref 70–99)
Glucose-Capillary: 156 mg/dL — ABNORMAL HIGH (ref 70–99)

## 2013-01-04 LAB — BASIC METABOLIC PANEL
BUN: 11 mg/dL (ref 6–23)
Chloride: 98 mEq/L (ref 96–112)
Glucose, Bld: 120 mg/dL — ABNORMAL HIGH (ref 70–99)
Potassium: 3.8 mEq/L (ref 3.5–5.1)

## 2013-01-04 LAB — PREPARE RBC (CROSSMATCH)

## 2013-01-04 MED ORDER — ACETAMINOPHEN 325 MG PO TABS
650.0000 mg | ORAL_TABLET | Freq: Once | ORAL | Status: AC
  Administered 2013-01-04: 650 mg via ORAL
  Filled 2013-01-04: qty 2

## 2013-01-04 MED ORDER — INSULIN ASPART 100 UNIT/ML ~~LOC~~ SOLN: 0.0000 [IU] | Freq: Every day | SUBCUTANEOUS | Status: DC

## 2013-01-04 MED ORDER — SODIUM CHLORIDE 0.9 % IJ SOLN
10.0000 mL | Freq: Two times a day (BID) | INTRAMUSCULAR | Status: DC
  Administered 2013-01-06 – 2013-01-07 (×2): 10 mL

## 2013-01-04 MED ORDER — SODIUM CHLORIDE 0.9 % IJ SOLN
10.0000 mL | INTRAMUSCULAR | Status: DC | PRN
  Administered 2013-01-05 – 2013-01-08 (×4): 10 mL

## 2013-01-04 MED ORDER — INSULIN ASPART 100 UNIT/ML ~~LOC~~ SOLN
0.0000 [IU] | Freq: Three times a day (TID) | SUBCUTANEOUS | Status: DC
  Administered 2013-01-04: 16:00:00 2 [IU] via SUBCUTANEOUS
  Administered 2013-01-05: 5 [IU] via SUBCUTANEOUS
  Administered 2013-01-05 – 2013-01-06 (×5): 2 [IU] via SUBCUTANEOUS
  Administered 2013-01-07: 3 [IU] via SUBCUTANEOUS
  Administered 2013-01-08: 2 [IU] via SUBCUTANEOUS

## 2013-01-04 NOTE — Evaluation (Signed)
Physical Therapy Evaluation Patient Details Name: Katherine [name] MRN: [id number] DOB: 08/29/1945 Today's Date: 01/04/2013 Time: 4098-1191 PT Time Calculation (min): 24 min  PT Assessment / Plan / Recommendation History of Present Illness  L knee I and D with polyexchange  Clinical Impression  Pt will benefit from PT to address deficits below; Pt plans to return to SNF at D/C, however she doe snot want to return to [facility]    PT Assessment  Patient needs continued PT services    Follow Up Recommendations  SNF    Does the patient have the potential to tolerate intense rehabilitation      Barriers to Discharge        Equipment Recommendations  Rolling walker with 5" wheels    Recommendations for Other Services     Frequency Min 6X/week    Precautions / Restrictions Precautions Precautions: Knee;Fall Precaution Comments: no KI ordered Restrictions Weight Bearing Restrictions: No Other Position/Activity Restrictions: WBAT   Pertinent Vitals/Pain Min c/o pain/soreness left knee      Mobility  Bed Mobility Bed Mobility: Sit to Supine Supine to Sit: 4: Min assist Sit to Supine: 4: Min assist Details for Bed Mobility Assistance: cues for sequence and use of R LE to self assist Transfers Transfers: Sit to Stand;Stand to Sit Sit to Stand: 4: Min assist Stand to Sit: 4: Min assist Details for Transfer Assistance: cues for LE management and use of UEs to self assist Ambulation/Gait Ambulation/Gait Assistance: 4: Min assist Ambulation Distance (Feet): 50 Feet Assistive device: Rolling walker Ambulation/Gait Assistance Details: verbal and visual cues for RW distance from self Gait Pattern: Step-to pattern    Exercises     PT Diagnosis: Difficulty walking  PT Problem List: Decreased strength;Decreased range of motion;Decreased activity tolerance;Decreased mobility;Decreased knowledge of use of DME;Obesity;Pain PT Treatment Interventions:       PT  Goals(Current goals can be found in the care plan section) Acute Rehab PT Goals Patient Stated Goal: Rehab and home to resume previous lifestyle with decreased pain PT Goal Formulation: With patient Time For Goal Achievement: 01/11/13 Potential to Achieve Goals: Good  Visit Information  Last PT Received On: 01/04/13 History of Present Illness: L knee I and D with polyexchange       Prior Functioning  Home Living Family/patient expects to be discharged to:: Skilled nursing facility Additional Comments: pt was at [facility]  Prior Function Comments: pt has aide  and family that assist at her baseline Communication Communication: Other (comment) (difficult to understand at times; )    Cognition  Cognition Arousal/Alertness: Awake/alert Behavior During Therapy: WFL for tasks assessed/performed Overall Cognitive Status: Within Functional Limits for tasks assessed    Extremity/Trunk Assessment Upper Extremity Assessment Upper Extremity Assessment: Overall WFL for tasks assessed Lower Extremity Assessment LLE Deficits / Details: quads 2/5, limited by pain   Balance Static Sitting Balance Static Sitting - Balance Support: No upper extremity supported;Feet supported Static Sitting - Level of Assistance: 5: Stand by assistance  End of Session PT - End of Session Equipment Utilized During Treatment: Gait belt Activity Tolerance: Patient tolerated treatment well Patient left: in bed;with call bell/phone within reach Nurse Communication: Mobility status CPM Left Knee CPM Left Knee: Off  GP     [facility] 01/04/2013, 1:10 PM

## 2013-01-04 NOTE — Progress Notes (Signed)
Subjective: 1 Day Post-Op Procedure(s) (LRB): IRRIGATION AND DEBRIDEMENT LEFT KNEE WITH POLY EXCHANGE (Left) Patient reports pain as moderate.  Symptomatic acute blood loss anemia.  Gram positive cocci on gram stain.  Objective: Vital signs in last 24 hours: Temp:  [97.2 F (36.2 C)-102.4 F (39.1 C)] 101.1 F (38.4 C) (12/06 0500) Pulse Rate:  [92-126] 108 (12/06 0900) Resp:  [13-22] 14 (12/06 0900) BP: (121-164)/(59-103) 164/84 mmHg (12/06 0900) SpO2:  [91 %-100 %] 98 % (12/06 0900) Weight:  [87.9 kg (193 lb 12.6 oz)] 87.9 kg (193 lb 12.6 oz) (12/06 0400)  Intake/Output from previous day: 12/05 0701 - 12/06 0700 In: 3500 [I.V.:3050; IV Piggyback:450] Out: 2240 [Urine:2055; Drains:165; Blood:20] Intake/Output this shift: Total I/O In: 640 [P.O.:440; I.V.:200] Out: 200 [Urine:200]   Recent Labs  01/02/13 1859 01/04/13 0332  HGB 10.1* 8.0*    Recent Labs  01/02/13 1859 01/04/13 0332  WBC 27.3* 18.1*  RBC 3.41* 2.75*  HCT 30.6* 24.7*  PLT 511* 496*    Recent Labs  01/02/13 1859 01/04/13 0332  NA 131* 132*  K 3.7 3.8  CL 96 98  CO2 24 25  BUN 19 11  CREATININE 0.59 0.48*  GLUCOSE 144* 120*  CALCIUM 8.7 8.2*   No results found for this basename: LABPT, INR,  in the last 72 hours  Sensation intact distally Intact pulses distally Dorsiflexion/Plantar flexion intact Incision: dressing C/D/I Compartment soft  Assessment/Plan: 1 Day Post-Op Procedure(s) (LRB): IRRIGATION AND DEBRIDEMENT LEFT KNEE WITH POLY EXCHANGE (Left) Up with therapy Continue ABX therapy due to Post-op infection Will needs PICC line.  BLACKMAN,CHRISTOPHER Y 01/04/2013, 10:32 AM

## 2013-01-04 NOTE — Progress Notes (Signed)
Peripherally Inserted Central Catheter/Midline Placement  The IV Nurse has discussed with the patient and/or persons authorized to consent for the patient, the purpose of this procedure and the potential benefits and risks involved with this procedure.  The benefits include less needle sticks, lab draws from the catheter and patient may be discharged home with the catheter.  Risks include, but not limited to, infection, bleeding, blood clot (thrombus formation), and puncture of an artery; nerve damage and irregular heat beat.  Alternatives to this procedure were also discussed.  PICC/Midline Placement Documentation  PICC / Midline Single Lumen 01/04/13 PICC Right Basilic 40 cm 2 cm (Active)  Indication for Insertion or Continuance of Line Home intravenous therapies (PICC only) 01/04/2013  2:09 PM  Exposed Catheter (cm) 2 cm 01/04/2013  2:09 PM  Site Assessment Clean;Dry;Intact 01/04/2013  2:09 PM  Line Status Flushed;Saline locked;Blood return noted 01/04/2013  2:09 PM  Dressing Type Transparent 01/04/2013  2:09 PM  Dressing Status Clean;Dry;Intact;Antimicrobial disc in place 01/04/2013  2:09 PM  Dressing Intervention New dressing 01/04/2013  2:09 PM  Dressing Change Due 01/11/13 01/04/2013  2:09 PM       Ethelda Chick 01/04/2013, 2:11 PM

## 2013-01-05 LAB — TYPE AND SCREEN
ABO/RH(D): O POS
Antibody Screen: NEGATIVE

## 2013-01-05 LAB — BASIC METABOLIC PANEL
BUN: 12 mg/dL (ref 6–23)
CO2: 26 mEq/L (ref 19–32)
Calcium: 8.5 mg/dL (ref 8.4–10.5)
Chloride: 97 mEq/L (ref 96–112)
Creatinine, Ser: 0.47 mg/dL — ABNORMAL LOW (ref 0.50–1.10)
Glucose, Bld: 122 mg/dL — ABNORMAL HIGH (ref 70–99)
Potassium: 3.5 mEq/L (ref 3.5–5.1)

## 2013-01-05 LAB — CBC
HCT: 27.4 % — ABNORMAL LOW (ref 36.0–46.0)
MCH: 29.7 pg (ref 26.0–34.0)
MCV: 87.5 fL (ref 78.0–100.0)
RBC: 3.13 MIL/uL — ABNORMAL LOW (ref 3.87–5.11)
RDW: 15 % (ref 11.5–15.5)
WBC: 14.5 10*3/uL — ABNORMAL HIGH (ref 4.0–10.5)

## 2013-01-05 LAB — GLUCOSE, CAPILLARY
Glucose-Capillary: 127 mg/dL — ABNORMAL HIGH (ref 70–99)
Glucose-Capillary: 133 mg/dL — ABNORMAL HIGH (ref 70–99)
Glucose-Capillary: 203 mg/dL — ABNORMAL HIGH (ref 70–99)
Glucose-Capillary: 182 mg/dL — ABNORMAL HIGH (ref 70–99)

## 2013-01-05 LAB — VANCOMYCIN, TROUGH: Vancomycin Tr: 5.8 ug/mL — ABNORMAL LOW (ref 10.0–20.0)

## 2013-01-05 MED ORDER — VANCOMYCIN HCL IN DEXTROSE 750-5 MG/150ML-% IV SOLN
750.0000 mg | Freq: Three times a day (TID) | INTRAVENOUS | Status: DC
  Administered 2013-01-05 – 2013-01-07 (×5): 750 mg via INTRAVENOUS
  Filled 2013-01-05 (×8): qty 150

## 2013-01-05 NOTE — Progress Notes (Addendum)
Clinical Social Work Department CLINICAL SOCIAL WORK PLACEMENT NOTE 01/05/2013  Patient:  Katherine [name], Katherine [name]  Account Number:  [id number] Admit date:  01/02/2013  Clinical Social Worker:  Doroteo Glassman  Date/time:  01/05/2013 10:21 AM  Clinical Social Work is seeking post-discharge placement for this patient at the following level of care:   SKILLED NURSING   (*CSW will update this form in Epic as items are completed)   01/05/2013  Patient/family provided with [facility] Department of Clinical Social Work's list of facilities offering this level of care within the geographic area requested by the patient (or if unable, by the patient's family).  01/05/2013  Patient/family informed of their freedom to choose among providers that offer the needed level of care, that participate in Medicare, Medicaid or managed care program needed by the patient, have an available bed and are willing to accept the patient.  01/05/2013  Patient/family informed of [facility]' ownership interest in [facility], as well as of the fact that they are under no obligation to receive care at this facility.  PASARR submitted to E[employer] on existing PASARR number received from [employer] on   FL2 transmitted to all facilities in geographic area requested by pt/family on  01/05/2013 FL2 transmitted to all facilities within larger geographic area on   Patient informed that his/her managed care company has contracts with or will negotiate with  certain facilities, including the following:     Patient/family informed of bed offers received:   Patient chooses bed at  Physician recommends and patient chooses bed at    Patient to be transferred to  on   Patient to be transferred to facility by   The following physician request were entered in Epic:   Additional Comments:  Providence Crosby, Theresia Majors Clinical Social Work [phone]
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
## 2013-01-05 NOTE — Progress Notes (Signed)
ANTIBIOTIC CONSULT NOTE   Pharmacy Consult for vancomycin Indication: left knee infection post TKA  Allergies  Allergen Reactions  . Penicillins     "made me nervous"  . Thioridazine Hcl Itching    Patient Measurements: Height: 4\' 11"  (149.9 cm) Weight: 193 lb 12.6 oz (87.9 kg) IBW/kg (Calculated) : 43.2  Vital Signs: Temp: 100.7 F (38.2 C) (12/07 1400) Temp src: Oral (12/07 1400) BP: 139/85 mmHg (12/07 1400) Pulse Rate: 115 (12/07 1400) Intake/Output from previous day: 12/06 0701 - 12/07 0700 In: 3306.3 [P.O.:1040; I.V.:1750; Blood:316.3; IV Piggyback:200] Out: 2010 [Urine:1950; Drains:60] Intake/Output from this shift: Total I/O In: 808.3 [I.V.:808.3] Out: 850 [Urine:850]  Labs:  Recent Labs  01/02/13 1859 01/04/13 0332 01/05/13 0513  WBC 27.3* 18.1* 14.5*  HGB 10.1* 8.0* 9.3*  PLT 511* 496* 463*  CREATININE 0.59 0.48* 0.47*   Estimated Creatinine Clearance: 65.8 ml/min (by C-G formula based on Cr of 0.47).  Recent Labs  01/05/13 1523  VANCOTROUGH 5.8*     Microbiology: Recent Results (from the past 720 hour(s))  URINE CULTURE     Status: None   Collection Time    12/17/12 11:25 AM      Result Value Range Status   Specimen Description URINE, CLEAN CATCH   Final   Special Requests NONE   Final   Culture  Setup Time     Final   Value: 12/18/2012 00:53     Performed at [facility] Count     Final   Value: 50,000 COLONIES/ML     Performed at [facility]   Culture     Final   Value: Multiple bacterial morphotypes present, none predominant. Suggest appropriate recollection if clinically indicated.     Performed at [facility]   Report Status 12/18/2012 FINAL   Final  SURGICAL PCR SCREEN     Status: None   Collection Time    12/17/12 11:45 AM      Result Value Range Status   MRSA, PCR NEGATIVE  NEGATIVE Final   Staphylococcus aureus NEGATIVE  NEGATIVE Final   Comment:            The Xpert SA Assay (FDA   approved for NASAL specimens     in patients over 61 years of age),     is one component of     a comprehensive surveillance     program.  Test performance has     been validated by [employer] for patients greater     than or equal to 59 year old.     It is not intended     to diagnose infection nor to     guide or monitor treatment.  URINE CULTURE     Status: None   Collection Time    01/03/13 11:27 AM      Result Value Range Status   Specimen Description URINE, CLEAN CATCH   Final   Special Requests NONE   Final   Culture  Setup Time     Final   Value: 01/03/2013 16:53     Performed at [facility] Count     Final   Value: 55,000 COLONIES/ML     Performed at [facility]   Culture     Final   Value: Multiple bacterial morphotypes present, none predominant. Suggest appropriate recollection if clinically indicated.     Performed at [facility]  Report Status 01/04/2013 FINAL   Final  GRAM STAIN     Status: None   Collection Time    01/03/13  3:40 PM      Result Value Range Status   Specimen Description SYNOVIAL LEFT KNEE SUPERFICIAL   Final   Special Requests NONE   Final   Gram Stain     Final   Value: NO WBC SEEN     RARE GRAM POSITIVE COCCI IN PAIRS     Gram Stain Report Called to,Read Back By and Verified With: PALIN,T. AT 1728 ON LOVE,T.   Report Status 01/03/2013 FINAL   Final  BODY FLUID CULTURE     Status: None   Collection Time    01/03/13  3:40 PM      Result Value Range Status   Specimen Description SYNOVIAL LEFT KNEE SUPERFICIAL   Final   Special Requests NONE   Final   Gram Stain     Final   Value: NO WBC SEEN     RARE GRAM POSITIVE COCCI IN PAIRS     Performed by [facility] Gram Stain Report Called to,Read Back By and Verified With: Gram Stain Report Called to,Read Back By and Verified With: PALIN T. AT 1728 ON 01/03/13 BY LOVE T.     Performed at [facility]     Final   Value:  STAPHYLOCOCCUS SPECIES     Note: RIFAMPIN AND GENTAMICIN SHOULD NOT BE USED AS SINGLE DRUGS FOR TREATMENT OF STAPH INFECTIONS.     Performed at [facility]   Report Status PENDING   Incomplete  ANAEROBIC CULTURE     Status: None   Collection Time    01/03/13  3:40 PM      Result Value Range Status   Specimen Description SYNOVIAL LEFT KNEE SUPERFICIAL   Final   Special Requests NONE   Final   Gram Stain     Final   Value: NO WBC SEEN     RARE GRAM POSITIVE COCCI IN PAIRS     Performed by [facility] Gram Stain Report Called to,Read Back By and Verified With: Gram Stain Report Called to,Read Back By and Verified With: PALIN T. AT 1728 ON 01/03/13 BY LOVE T.     Performed at [facility]     Final   Value: NO ANAEROBES ISOLATED; CULTURE IN PROGRESS FOR 5 DAYS     Performed at [facility]   Report Status PENDING   Incomplete  GRAM STAIN     Status: None   Collection Time    01/03/13  3:44 PM      Result Value Range Status   Specimen Description SYNOVIAL LEFT KNEE DEEP   Final   Special Requests NONE   Final   Gram Stain     Final   Value: RARE WBC PRESENT, PREDOMINANTLY PMN     FEW GRAM POSITIVE COCCI IN PAIRS     Gram Stain Report Called to,Read Back By and Verified With: PALIN,T. AT 1728 ON 12.05.14 BY LOVE,T.   Report Status 01/03/2013 FINAL   Final  BODY FLUID CULTURE     Status: None   Collection Time    01/03/13  3:44 PM      Result Value Range Status   Specimen Description SYNOVIAL LEFT KNEE DEEP   Final   Special Requests NONE   Final   Gram Stain     Final  Value: RARE WBC PRESENT, PREDOMINANTLY PMN     FEW GRAM POSITIVE COCCI IN PAIRS     Performed by [facility] Gram Stain Report Called to,Read Back By and Verified With: Gram Stain Report Called to,Read Back By and Verified With: PALIN T. AT 1728 ON 01/03/13 BY LOVE T.     Performed at [facility]     Final   Value: MODERATE STAPHYLOCOCCUS SPECIES      Note: RIFAMPIN AND GENTAMICIN SHOULD NOT BE USED AS SINGLE DRUGS FOR TREATMENT OF STAPH INFECTIONS.     Performed at [facility]   Report Status PENDING   Incomplete  ANAEROBIC CULTURE     Status: None   Collection Time    01/03/13  3:44 PM      Result Value Range Status   Specimen Description SYNOVIAL LEFT KNEE DEEP   Final   Special Requests NONE   Final   Gram Stain     Final   Value: RARE WBC PRESENT, PREDOMINANTLY PMN     FEW GRAM POSITIVE COCCI IN PAIRS     Gram Stain Report Called to,Read Back By and Verified With: Gram Stain Report Called to,Read Back By and Verified With: PALIN T. AT 1728 ON 01/03/13 BY LOVE T. Performed by [facility]     Performed at [facility]   Culture     Final   Value: NO ANAEROBES ISOLATED; CULTURE IN PROGRESS FOR 5 DAYS     Performed at [facility]   Report Status PENDING   Incomplete    Medical History: Past Medical History  Diagnosis Date  . ABDOMINAL PAIN, LOWER 02/04/2010  . ABNORMAL THYROID FUNCTION TESTS 08/04/2009  . ALLERGIC RHINITIS 09/05/2006  . ANXIETY 07/31/2007  . DEPRESSION 09/05/2006  . DIVERTICULOSIS, COLON 09/05/2006  . Dysuria 04/15/2010  . Failure to thrive in childhood 04/06/2010  . FATIGUE 04/15/2010  . FIBROMYALGIA 05/10/2009  . FRACTURE, PELVIS, RIGHT 04/15/2010  . GASTROENTERITIS, ACUTE 12/25/2008  . GERD 09/05/2006  . HYPERLIPIDEMIA 09/05/2006  . HYPERTENSION 09/05/2006  . KNEE PAIN, LEFT 02/04/2010  . OSTEOARTHRITIS 09/05/2006  . OSTEOPOROSIS 07/31/2007  . Other specified forms of hearing loss 11/01/2009  . SINUSITIS- ACUTE-NOS 10/09/2007  . SKIN LESION 05/10/2009  . SPRAIN&STRAIN OTH SPEC SITES SHOULDER&UPPER ARM 11/01/2009  . Unspecified hearing loss 10/09/2007  . URI 02/20/2007  . UTI 02/20/2007  . Blood transfusion   . Cataract     resolved -surgery done  . Heart murmur   . DIABETES MELLITUS, TYPE II 07/31/2007    no meds    Medications:  Scheduled:  . amLODipine  5 mg Oral q morning - 10a  .  atorvastatin  40 mg Oral q morning - 10a  . azelastine  1 spray Each Nare BID  . benztropine  0.5 mg Oral BID  . docusate sodium  100 mg Oral BID  . doxazosin  8 mg Oral q morning - 10a  . fluticasone  2 spray Each Nare Daily  . insulin aspart  0-15 Units Subcutaneous TID WC  . insulin aspart  0-5 Units Subcutaneous QHS  . ipratropium  2 spray Each Nare TID  . loratadine  10 mg Oral Daily  . multivitamin with minerals  1 tablet Oral Daily  . pantoprazole  40 mg Oral Daily  . pregabalin  75 mg Oral BID  . risperiDONE  3 mg Oral QHS  . sodium chloride  10-40 mL Intracatheter  Q12H  . vancomycin  1,000 mg Intravenous Q12H   Infusions:  . sodium chloride 100 mL/hr at 01/04/13 1913   Assessment: 67 yo female s/p irrigation and debridement left knee with poly exchange after left knee infection post TKA to start vancomycin per pharmacy dosing post op. Note pre-op vanc dose of 1g given around 1600  12/5 >> vancomycin >>  Tmax: 100.4 WBCs: Continues improving, now 14.5 Renal: Scr 0.48 stable, CrCl 66 CG  12/5 knee synovial superficial x3: GPC pairs on gram stain; cx staph species 12/5 knee synovial deep x3: GPC pairs on gram stain; cx mod staph species 12/5 urine: 55K multiple bacteria  Dose changes/drug level info:  12/7 1530 VT = 5.8  mcg/ml on 1gm IV q12h (prior to 5th dose)  Goal of Therapy:  Vancomycin trough level 15-20 mcg/ml  Plan:   Vancomycin trough much lower then predicted, all doses charted as given. To achieve vancomycin trough of 10-65mcg/ml will need to change vancomycin to 750mg  IV q8h  Will need to check vancomycin trough tues am prior to discharge  If unable to do q8h dosing at SNF the will need q12h regimen (? 1500mg  IV q12h) but will not likely achieve goal trough.   Juliette Alcide, PharmD, BCPS.   Pager: [phone] 01/05/2013 4:14 PM

## 2013-01-05 NOTE — Progress Notes (Signed)
Patient ID: Katherine [name], female   DOB: 11-29-1945, 67 y.o.   MRN: [id number] White blood cell count continues to decrease steadily. Plan for discharge to skilled nursing facility. Patient has no complaints this morning. Anticipate discharge on Tuesday.

## 2013-01-05 NOTE — Progress Notes (Signed)
Physical Therapy Treatment Patient Details Name: Katherine [name] MRN: [id number] DOB: Mar 01, 1945 Today's Date: 01/05/2013 Time: 4098-1191 PT Time Calculation (min): 48 min  PT Assessment / Plan / Recommendation  History of Present Illness L knee I and D with polyexchange   PT Comments     Follow Up Recommendations  SNF     Does the patient have the potential to tolerate intense rehabilitation     Barriers to Discharge        Equipment Recommendations  Rolling walker with 5" wheels    Recommendations for Other Services OT consult  Frequency Min 6X/week   Progress towards PT Goals Progress towards PT goals: Progressing toward goals  Plan Current plan remains appropriate    Precautions / Restrictions Precautions Precautions: Knee;Fall Precaution Comments: no KI ordered Restrictions Weight Bearing Restrictions: No Other Position/Activity Restrictions: WBAT   Pertinent Vitals/Pain 6/10; premed, ice pack provided    Mobility  Bed Mobility Bed Mobility: Supine to Sit Supine to Sit: 4: Min assist;With rails;HOB elevated Details for Bed Mobility Assistance: cues for sequence and use of R LE to self assist Transfers Transfers: Sit to Stand;Stand to Sit Sit to Stand: 4: Min assist Stand to Sit: 4: Min assist Details for Transfer Assistance: cues for LE management and use of UEs to self assist Ambulation/Gait Ambulation/Gait Assistance: 4: Min assist Ambulation Distance (Feet): 45 Feet Assistive device: Rolling walker Ambulation/Gait Assistance Details: cues for posture, position from RW and stride length Gait Pattern: Step-to pattern;Decreased step length - right;Decreased step length - left;Shuffle;Antalgic Gait velocity: decr General Gait Details: min/guard for safety Stairs: No    Exercises Total Joint Exercises Ankle Circles/Pumps: AROM;Supine;Both;20 reps Quad Sets: AROM;Both;Supine;15 reps Short Arc QuadBarbaraann Boys;Left;Supine;20 reps Heel Slides:  AAROM;Supine;Left;20 reps Straight Leg Raises: AAROM;Left;Supine;AROM;20 reps   PT Diagnosis:    PT Problem List:   PT Treatment Interventions:     PT Goals (current goals can now be found in the care plan section) Acute Rehab PT Goals Patient Stated Goal: Rehab and home to resume previous lifestyle with decreased pain PT Goal Formulation: With patient Time For Goal Achievement: 01/11/13 Potential to Achieve Goals: Good  Visit Information  Last PT Received On: 01/05/13 Assistance Needed: +1 History of Present Illness: L knee I and D with polyexchange    Subjective Data  Patient Stated Goal: Rehab and home to resume previous lifestyle with decreased pain   Cognition  Cognition Arousal/Alertness: Awake/alert Behavior During Therapy: WFL for tasks assessed/performed Overall Cognitive Status: Within Functional Limits for tasks assessed    Balance     End of Session PT - End of Session Equipment Utilized During Treatment: Gait belt Activity Tolerance: Patient tolerated treatment well Patient left: with call bell/phone within reach;in chair Nurse Communication: Mobility status   GP     Yuna Pizzolato 01/05/2013, 12:30 PM

## 2013-01-06 DIAGNOSIS — D649 Anemia, unspecified: Secondary | ICD-10-CM | POA: Diagnosis present

## 2013-01-06 DIAGNOSIS — Z96659 Presence of unspecified artificial knee joint: Secondary | ICD-10-CM

## 2013-01-06 DIAGNOSIS — A4902 Methicillin resistant Staphylococcus aureus infection, unspecified site: Secondary | ICD-10-CM

## 2013-01-06 DIAGNOSIS — T8450XA Infection and inflammatory reaction due to unspecified internal joint prosthesis, initial encounter: Principal | ICD-10-CM

## 2013-01-06 LAB — BODY FLUID CULTURE: Gram Stain: NONE SEEN

## 2013-01-06 LAB — CBC
HCT: 25.7 % — ABNORMAL LOW (ref 36.0–46.0)
MCHC: 32.7 g/dL (ref 30.0–36.0)
Platelets: 421 10*3/uL — ABNORMAL HIGH (ref 150–400)
RDW: 14.6 % (ref 11.5–15.5)
WBC: 16.4 10*3/uL — ABNORMAL HIGH (ref 4.0–10.5)

## 2013-01-06 LAB — GLUCOSE, CAPILLARY: Glucose-Capillary: 114 mg/dL — ABNORMAL HIGH (ref 70–99)

## 2013-01-06 MED ORDER — MUPIROCIN 2 % EX OINT
1.0000 "application " | TOPICAL_OINTMENT | Freq: Two times a day (BID) | CUTANEOUS | Status: DC
  Administered 2013-01-06 – 2013-01-08 (×5): 1 via NASAL
  Filled 2013-01-06 (×2): qty 22

## 2013-01-06 MED ORDER — CHLORHEXIDINE GLUCONATE CLOTH 2 % EX PADS
6.0000 | MEDICATED_PAD | Freq: Every day | CUTANEOUS | Status: DC
  Administered 2013-01-06 – 2013-01-08 (×3): 6 via TOPICAL

## 2013-01-06 MED ORDER — RIFAMPIN 300 MG PO CAPS
600.0000 mg | ORAL_CAPSULE | Freq: Two times a day (BID) | ORAL | Status: DC
  Filled 2013-01-06 (×2): qty 2

## 2013-01-06 MED ORDER — RIFAMPIN 300 MG PO CAPS
300.0000 mg | ORAL_CAPSULE | Freq: Two times a day (BID) | ORAL | Status: DC
  Administered 2013-01-06 – 2013-01-08 (×5): 300 mg via ORAL
  Filled 2013-01-06 (×6): qty 1

## 2013-01-06 NOTE — Progress Notes (Signed)
Pt lab result for positive MRSA from Lft knee culture. MD notified. Contact pre-cautions in place.

## 2013-01-06 NOTE — Consult Note (Signed)
[facility]    Date of Admission:  01/02/2013   Total days of antibiotics 4               Reason for Consult: MRSA left prosthetic knee infection    Referring Physician: Dr. Allie Bossier   Principal Problem:   Infection of left total knee replacement Active Problems:   DIABETES MELLITUS, TYPE II   HYPERLIPIDEMIA   HYPERTENSION   GERD   OSTEOARTHRITIS   Normocytic anemia   . amLODipine  5 mg Oral q morning - 10a  . atorvastatin  40 mg Oral q morning - 10a  . azelastine  1 spray Each Nare BID  . benztropine  0.5 mg Oral BID  . Chlorhexidine Gluconate Cloth  6 each Topical Q0600  . docusate sodium  100 mg Oral BID  . doxazosin  8 mg Oral q morning - 10a  . fluticasone  2 spray Each Nare Daily  . insulin aspart  0-15 Units Subcutaneous TID WC  . insulin aspart  0-5 Units Subcutaneous QHS  . ipratropium  2 spray Each Nare TID  . loratadine  10 mg Oral Daily  . multivitamin with minerals  1 tablet Oral Daily  . mupirocin ointment  1 application Nasal BID  . pantoprazole  40 mg Oral Daily  . pregabalin  75 mg Oral BID  . rifampin  300 mg Oral Q12H  . risperiDONE  3 mg Oral QHS  . sodium chloride  10-40 mL Intracatheter Q12H  . vancomycin  750 mg Intravenous Q8H    Recommendations: 1. Continue IV vancomycin and oral rifampin for 6 weeks then convert to a drug oral regimen for 6 months total therapy   Assessment: She developed acute MRSA infection of her new left prosthetic knee. Open washout with poly-exchange followed by 6 months of 2 drug antibiotic therapy including rifampin will be needed to give her the best chance for cure and joint salvage. I will arrange followup in our clinic within the next few weeks to monitor her antibiotic therapy and clinical progress.    HPI: Katherine [name] is a 67 y.o. female with painful left knee osteoarthritis who underwent left total knee arthroplasty on November 21. She did well initially and  was discharged to a skilled nursing facility but developed sudden onset of redness, pain and wound drainage leading to readmission on December 4. She underwent urgent incision and drainage with poly-exchange and antibiotic bead placement 2 days ago. Operative cultures are growing MRSA.   Review of Systems: Pertinent items are noted in HPI.  Past Medical History  Diagnosis Date  . ABDOMINAL PAIN, LOWER 02/04/2010  . ABNORMAL THYROID FUNCTION TESTS 08/04/2009  . ALLERGIC RHINITIS 09/05/2006  . ANXIETY 07/31/2007  . DEPRESSION 09/05/2006  . DIVERTICULOSIS, COLON 09/05/2006  . Dysuria 04/15/2010  . Failure to thrive in childhood 04/06/2010  . FATIGUE 04/15/2010  . FIBROMYALGIA 05/10/2009  . FRACTURE, PELVIS, RIGHT 04/15/2010  . GASTROENTERITIS, ACUTE 12/25/2008  . GERD 09/05/2006  . HYPERLIPIDEMIA 09/05/2006  . HYPERTENSION 09/05/2006  . KNEE PAIN, LEFT 02/04/2010  . OSTEOARTHRITIS 09/05/2006  . OSTEOPOROSIS 07/31/2007  . Other specified forms of hearing loss 11/01/2009  . SINUSITIS- ACUTE-NOS 10/09/2007  . SKIN LESION 05/10/2009  . SPRAIN&STRAIN OTH SPEC SITES SHOULDER&UPPER ARM 11/01/2009  . Unspecified hearing loss 10/09/2007  . URI 02/20/2007  . UTI 02/20/2007  . Blood transfusion   . Cataract  resolved -surgery done  . Heart murmur   . DIABETES MELLITUS, TYPE II 07/31/2007    no meds    History  Substance Use Topics  . Smoking status: Never Smoker   . Smokeless tobacco: Not on file  . Alcohol Use: No    Family History  Problem Relation Age of Onset  . Coronary artery disease Other   . Diabetes Other   . Colon cancer Neg Hx   . Esophageal cancer Neg Hx   . Stomach cancer Neg Hx   . Rectal cancer Neg Hx    Allergies  Allergen Reactions  . Penicillins     "made me nervous"  . Thioridazine Hcl Itching    OBJECTIVE: Blood pressure 140/89, pulse 99, temperature 98.5 F (36.9 C), temperature source Oral, resp. rate 20, height 4\' 11"  (1.499 m), weight 87.9 kg (193 lb 12.6 oz), SpO2  96.00%. General: She is alert and in good spirits watching television Skin: New right arm PICC Lungs: Clear Cor: Regular S1-S2 no murmurs Left knee: She has a clean dry gauze dressing over her left knee incision. There is some faint diffuse redness around her knee  Lab Results Lab Results  Component Value Date   WBC 16.4* 01/06/2013   HGB 8.4* 01/06/2013   HCT 25.7* 01/06/2013   MCV 88.0 01/06/2013   PLT 421* 01/06/2013    Lab Results  Component Value Date   CREATININE 0.47* 01/05/2013   BUN 12 01/05/2013   NA 132* 01/05/2013   K 3.5 01/05/2013   CL 97 01/05/2013   CO2 26 01/05/2013    Lab Results  Component Value Date   ALT 27 11/07/2012   AST 31 11/07/2012   ALKPHOS 65 11/07/2012   BILITOT 0.7 11/07/2012     Microbiology: Recent Results (from the past 240 hour(s))  URINE CULTURE     Status: None   Collection Time    01/03/13 11:27 AM      Result Value Range Status   Specimen Description URINE, CLEAN CATCH   Final   Special Requests NONE   Final   Culture  Setup Time     Final   Value: 01/03/2013 16:53     Performed at [facility] Count     Final   Value: 55,000 COLONIES/ML     Performed at [facility]   Culture     Final   Value: Multiple bacterial morphotypes present, none predominant. Suggest appropriate recollection if clinically indicated.     Performed at [facility]   Report Status 01/04/2013 FINAL   Final  GRAM STAIN     Status: None   Collection Time    01/03/13  3:40 PM      Result Value Range Status   Specimen Description SYNOVIAL LEFT KNEE SUPERFICIAL   Final   Special Requests NONE   Final   Gram Stain     Final   Value: NO WBC SEEN     RARE GRAM POSITIVE COCCI IN PAIRS     Gram Stain Report Called to,Read Back By and Verified With: PALIN,T. AT 1728 ON LOVE,T.   Report Status 01/03/2013 FINAL   Final  BODY FLUID CULTURE     Status: None   Collection Time    01/03/13  3:40 PM      Result Value Range Status   Specimen  Description SYNOVIAL LEFT KNEE SUPERFICIAL   Final   Special Requests NONE   Final  Gram Stain     Final   Value: NO WBC SEEN     RARE GRAM POSITIVE COCCI IN PAIRS     Performed by [facility] Gram Stain Report Called to,Read Back By and Verified With: Gram Stain Report Called to,Read Back By and Verified With: PALIN T. AT 1728 ON 01/03/13 BY LOVE T.     Performed at [facility]     Final   Value: FEW METHICILLIN RESISTANT STAPHYLOCOCCUS AUREUS     Note: RIFAMPIN AND GENTAMICIN SHOULD NOT BE USED AS SINGLE DRUGS FOR TREATMENT OF STAPH INFECTIONS. This organism DOES NOT demonstrate inducible Clindamycin resistance in vitro. CRITICAL RESULT CALLED TO, READ BACK BY AND VERIFIED WITH: EVELYN O @ 0912      01/06/13 BY KRAWS     Performed at [facility]   Report Status 01/06/2013 FINAL   Final   Organism ID, Bacteria METHICILLIN RESISTANT STAPHYLOCOCCUS AUREUS   Final  ANAEROBIC CULTURE     Status: None   Collection Time    01/03/13  3:40 PM      Result Value Range Status   Specimen Description SYNOVIAL LEFT KNEE SUPERFICIAL   Final   Special Requests NONE   Final   Gram Stain     Final   Value: NO WBC SEEN     RARE GRAM POSITIVE COCCI IN PAIRS     Performed by [facility] Gram Stain Report Called to,Read Back By and Verified With: Gram Stain Report Called to,Read Back By and Verified With: PALIN T. AT 1728 ON 01/03/13 BY LOVE T.     Performed at [facility]     Final   Value: NO ANAEROBES ISOLATED; CULTURE IN PROGRESS FOR 5 DAYS     Performed at [facility]   Report Status PENDING   Incomplete  GRAM STAIN     Status: None   Collection Time    01/03/13  3:44 PM      Result Value Range Status   Specimen Description SYNOVIAL LEFT KNEE DEEP   Final   Special Requests NONE   Final   Gram Stain     Final   Value: RARE WBC PRESENT, PREDOMINANTLY PMN     FEW GRAM POSITIVE COCCI IN PAIRS     Gram Stain Report Called  to,Read Back By and Verified With: PALIN,T. AT 1728 ON 12.05.14 BY LOVE,T.   Report Status 01/03/2013 FINAL   Final  BODY FLUID CULTURE     Status: None   Collection Time    01/03/13  3:44 PM      Result Value Range Status   Specimen Description SYNOVIAL LEFT KNEE DEEP   Final   Special Requests NONE   Final   Gram Stain     Final   Value: RARE WBC PRESENT, PREDOMINANTLY PMN     FEW GRAM POSITIVE COCCI IN PAIRS     Performed by [facility] Gram Stain Report Called to,Read Back By and Verified With: Gram Stain Report Called to,Read Back By and Verified With: PALIN T. AT 1728 ON 01/03/13 BY LOVE T.     Performed at [facility]     Final   Value: MODERATE METHICILLIN RESISTANT STAPHYLOCOCCUS AUREUS     Note: RIFAMPIN AND GENTAMICIN SHOULD NOT BE USED AS SINGLE DRUGS FOR TREATMENT OF STAPH INFECTIONS. This organism DOES NOT demonstrate inducible Clindamycin resistance in vitro.  CRITICAL RESULT CALLED TO, READ BACK BY AND VERIFIED WITH: EVELYN O @ 0912      01/06/13 BY KRAWS     Performed at [facility]   Report Status 01/06/2013 FINAL   Final   Organism ID, Bacteria METHICILLIN RESISTANT STAPHYLOCOCCUS AUREUS   Final  ANAEROBIC CULTURE     Status: None   Collection Time    01/03/13  3:44 PM      Result Value Range Status   Specimen Description SYNOVIAL LEFT KNEE DEEP   Final   Special Requests NONE   Final   Gram Stain     Final   Value: RARE WBC PRESENT, PREDOMINANTLY PMN     FEW GRAM POSITIVE COCCI IN PAIRS     Gram Stain Report Called to,Read Back By and Verified With: Gram Stain Report Called to,Read Back By and Verified With: PALIN T. AT 1728 ON 01/03/13 BY LOVE T. Performed by [facility]     Performed at [facility]   Culture     Final   Value: NO ANAEROBES ISOLATED; CULTURE IN PROGRESS FOR 5 DAYS     Performed at [facility]   Report Status PENDING   Incomplete    Cliffton Asters, MD [facility]  [facility] [phone] pager   [phone] cell 01/06/2013, 4:34 PM

## 2013-01-06 NOTE — Progress Notes (Signed)
Subjective: 3 Days Post-Op Procedure(s) (LRB): IRRIGATION AND DEBRIDEMENT LEFT KNEE WITH POLY EXCHANGE (Left) Patient reports pain as moderate.  Asymptomatic acute blood loss anemia.  Cultures growing staph thus far, but not final yet.  WBC up to 16.  PICC-line in.  Objective: Vital signs in last 24 hours: Temp:  [98.2 F (36.8 C)-100.7 F (38.2 C)] 98.2 F (36.8 C) (12/08 0536) Pulse Rate:  [92-115] 92 (12/08 0536) Resp:  [18-20] 20 (12/08 0536) BP: (139-148)/(84-87) 139/85 mmHg (12/08 0536) SpO2:  [94 %-97 %] 94 % (12/08 0536)  Intake/Output from previous day: 12/07 0701 - 12/08 0700 In: 2148.3 [P.O.:400; I.V.:1448.3; IV Piggyback:300] Out: 1700 [Urine:1700] Intake/Output this shift:     Recent Labs  01/04/13 0332 01/05/13 0513 01/06/13 0325  HGB 8.0* 9.3* 8.4*    Recent Labs  01/05/13 0513 01/06/13 0325  WBC 14.5* 16.4*  RBC 3.13* 2.92*  HCT 27.4* 25.7*  PLT 463* 421*    Recent Labs  01/04/13 0332 01/05/13 0513  NA 132* 132*  K 3.8 3.5  CL 98 97  CO2 25 26  BUN 11 12  CREATININE 0.48* 0.47*  GLUCOSE 120* 122*  CALCIUM 8.2* 8.5   No results found for this basename: LABPT, INR,  in the last 72 hours  Sensation intact distally Intact pulses distally Dorsiflexion/Plantar flexion intact Incision: scant drainage No cellulitis present Compartment soft  Assessment/Plan: 3 Days Post-Op Procedure(s) (LRB): IRRIGATION AND DEBRIDEMENT LEFT KNEE WITH POLY EXCHANGE (Left) Up with therapy Continue ABX therapy due to Post-op infection Start Rifampin. Consult ID physicians for antibiotic type and duration.  Taima Rada Y 01/06/2013, 7:27 AM

## 2013-01-06 NOTE — Progress Notes (Signed)
Pt has been bleeding through dressing progressively throughout the day. Called MD and notified, was told to change dressing with gauze and Tegaderm. Dressing was changed at 15:30. Site was red and warm, staples in tact. VSS.

## 2013-01-06 NOTE — Progress Notes (Deleted)
Patient ID: Katherine [name], female   DOB: 1945-06-08, 67 y.o.   MRN: [id number] Postoperative day 1 left hip hemiarthroplasty. Patient will need discharge to skilled nursing. Will have dressing changed today.

## 2013-01-06 NOTE — Progress Notes (Signed)
Utilization review completed.  

## 2013-01-06 NOTE — Progress Notes (Signed)
01/06/13 1300  PT Visit Information  Last PT Received On 01/06/13  Assistance Needed +1  History of Present Illness L knee I and D with polyexchange  PT Time Calculation  PT Start Time 1333  PT Stop Time 1401  PT Time Calculation (min) 28 min  Subjective Data  Subjective yes, let's do it  Patient Stated Goal Rehab and home to resume previous lifestyle with decreased pain  Precautions  Precautions Knee;Fall  Precaution Comments no KI ordered  Restrictions  Other Position/Activity Restrictions WBAT  Cognition  Arousal/Alertness Awake/alert  Behavior During Therapy WFL for tasks assessed/performed  Overall Cognitive Status Within Functional Limits for tasks assessed  Bed Mobility  Bed Mobility Sit to Supine  Transfers  Transfers Sit to Stand;Stand to Sit  Sit to Stand 4: Min guard  Stand to Sit 4: Min guard (x2 from)  Details for Transfer Assistance cues for LE management and use of UEs to self assist  Ambulation/Gait  Ambulation/Gait Assistance 4: Min guard;4: Min assist  Ambulation Distance (Feet) 15 Feet (x 2)  Assistive device Rolling walker  Ambulation/Gait Assistance Details cues for RW distance from self  Gait Pattern Step-to pattern;Decreased step length - right;Decreased step length - left;Shuffle;Antalgic  General Gait Details min/guard for safety  Total Joint Exercises  Ankle Circles/Pumps AROM;Supine;Both;20 reps  Quad Sets AROM;Both;Supine;15 reps  Short [place];Left;Supine;10 reps  Heel Slides AAROM;Left;10 reps;Supine  Hip ABduction/ADduction AROM;AAROM;Left;10 reps;Supine  Straight Leg Raises AAROM;Left;10 reps  PT - End of Session  Equipment Utilized During Treatment Gait belt  Activity Tolerance Patient tolerated treatment well  Patient left in bed;with call bell/phone within reach;with nursing/sitter in room  Nurse Communication Mobility status;Other (comment) (pt dressing saturated; RN notified)  PT - Assessment/Plan  PT Plan Current plan  remains appropriate  PT Frequency Min 6X/week  Follow Up Recommendations SNF  PT equipment Rolling walker with 5" wheels  PT Goal Progression  Progress towards PT goals Progressing toward goals  Acute Rehab PT Goals  Time For Goal Achievement 01/11/13  Potential to Achieve Goals Good  PT General Charges  $$ ACUTE PT VISIT 1 Procedure  PT Treatments  $Gait Training 8-22 mins  $Therapeutic Exercise 8-22 mins

## 2013-01-06 NOTE — Op Note (Signed)
NAMEERNESTINA, JOE             ACCOUNT NO.:  [id number]  MEDICAL RECORD NO.:  [id number]  LOCATION:                                 FACILITY:  PHYSICIAN:  Vanita Panda. Magnus Ivan, M.D.DATE OF BIRTH:  [birth year]  DATE OF PROCEDURE:  01/03/2013 DATE OF DISCHARGE:                              OPERATIVE REPORT   PREOPERATIVE DIAGNOSIS:  Acute postoperative infection, status post left total knee arthroplasty.  POSTOPERATIVE DIAGNOSIS:  Acute postoperative infection, status post left total knee arthroplasty.  PROCEDURE: 1. Irrigation and debridement of left knee, skin superficial and deep     tissues. 2. Polyethylene insert exchange of left total knee. 3. Placement of antibiotic laden stimulant beads into arthrotomy.  FINDINGS:  Gross purulence superficial and deep cultures pending.  SURGEON:  Vanita Panda. Magnus Ivan, M.D.  ASSISTANT:  Richardean Canal, PA-C.  ANESTHESIA:  General.  TOURNIQUET TIME:  Less than 2 hours.  BLOOD LOSS:  Less than 200 mL.  ANTIBIOTICS:  1 g of IV vancomycin after cultures obtained.  COMPLICATIONS:  None.  INDICATIONS:  Ms. Carbo is a 67 year old female who 2 weeks ago today underwent a uncomplicated left total knee arthroplasty.  She then presented to the office yesterday with a 1-day history of drainage and swelling from her knee and admitted her to the hospital last night for a workup.  She was not given antibiotics and her white blood cell count was 27,000.  Her CRP was 30 and her sed rate was 75.  We did check a urinalysis that was positive for a urinary tract infection as well.  The goal was to salvage this knee given this acute infection.  I talked to her in detail about proceeding to the operating room for a thorough irrigation and debridement and placement of antibiotic beads and as well as putting her on IV antibiotics as a goal to eradicate the infection and salvaging her total knee.  She understands this fully and does wish to  proceed with surgery.  PROCEDURE DESCRIPTION:  After informed consent was obtained, appropriate left knee was marked.  She was brought to the operating room and placed supine on the operating room table, where general anesthesia was then obtained.  A nonsterile tourniquet was placed around her upper left thigh and her left leg was prepped and draped with Betadine scrub and paint, including a sterile stockinette.  A time-out was called and she was identified as correct patient and correct left knee.  There was already drainage from the knee and even when I opened up the superficial tissues, there was a gross purulence noted and I sent off stat, Gram stain and cultures.  I could see that there was a small opening also in the arthrotomy at the level of the knee implant medially.  I took deep cultures as well.  She was then given a g of vancomycin.  I then used 6 L of normal saline solution to lavage through the superficial tissues before getting into the deep arthrotomy and removed necrotic tissue sharply with a knife, including the skin and soft tissue.  I then opened up the arthrotomy in its entirety and removed all the existing sutures. I curetted all around  the implant, the bone, the soft tissue and the medial and lateral gutters and removed the polyethylene insert.  When removed the polyethylene insert, we also were able to get to the posterior joint capsule, and the surrounding tissues performing a synovectomy as well.  We then changed out all instrumentation and gloves and I copiously irrigated the soft tissue deep in the joint around the implant with normal saline using an additional 3 L of normal saline solution.  Once this was done, we then placed in U 60 mm polyethylene insert for a [employer] Tri[employer] knee.  We then placed antibiotic laden stimulant beats in the superior pouch of the knee as well the medial and lateral gutters, which was antibiotic stimulant beads with  vancomycin and tobramycin.  We then placed a medium Hemovac in the arthrotomy and closed the arthrotomy with interrupted #1 Vicryl suture, followed by 2-0 Vicryl in subcutaneous tissue, and staples on the skin.  Xeroform and well-padded sterile dressing was applied.  The tourniquet was let down. Her toes pinked nicely.  She was awakened, extubated, and taken to recovery room in stable condition.  All final counts were correct. There were no complications noted.  Postoperatively, she will be continued in the hospital on IV antibiotics with the likelihood of needing to be on a PICC line for longer term IV antibiotics to again hopefully salvage the knee.  Of note, Richardean Canal, PA-C was present in the entire case and his presence was crucial for expediting this case.     Vanita Panda. Magnus Ivan, M.D.     CYB/MEDQ  D:  01/03/2013  T:  01/04/2013  Job:  [id number]

## 2013-01-06 NOTE — Progress Notes (Signed)
Physical Therapy Treatment Patient Details Name: Katherine [name] MRN: [id number] DOB: [birth year] Today's Date: 01/06/2013 Time: 4098-1191 PT Time Calculation (min): 19 min  PT Assessment / Plan / Recommendation  History of Present Illness L knee I and D with polyexchange   PT Comments   Pt progressing, will benefit from SNF post acute; left knee dsg saturated with blood after amb;   Follow Up Recommendations  SNF     Does the patient have the potential to tolerate intense rehabilitation     Barriers to Discharge        Equipment Recommendations  Rolling walker with 5" wheels    Recommendations for Other Services    Frequency Min 6X/week   Progress towards PT Goals Progress towards PT goals: Progressing toward goals  Plan Current plan remains appropriate    Precautions / Restrictions Precautions Precautions: Knee;Fall Precaution Comments: no KI ordered Restrictions Other Position/Activity Restrictions: WBAT   Pertinent Vitals/Pain Min c/opain   Mobility  Bed Mobility Bed Mobility: Supine to Sit Supine to Sit: 4: Min assist;HOB elevated;With rails Details for Bed Mobility Assistance: cues for sequence and use of R LE to self assist Transfers Transfers: Sit to Stand;Stand to Sit Sit to Stand: 4: Min assist Stand to Sit: 4: Min assist Details for Transfer Assistance: cues for LE management and use of UEs to self assist Ambulation/Gait Ambulation/Gait Assistance: 4: Min assist Ambulation Distance (Feet): 70 Feet Assistive device: Rolling walker Ambulation/Gait Assistance Details: cues and assist for RW direction, sequence and safety Gait Pattern: Step-to pattern;Decreased step length - right;Decreased step length - left;Shuffle;Antalgic    Exercises Total Joint Exercises Ankle Circles/Pumps: AROM;Supine;Both;20 reps Quad Sets: AROM;Both;Supine;15 reps Knee Flexion: PROM;AAROM;Left;5 reps;Seated   PT Diagnosis:    PT Problem List:   PT Treatment Interventions:      PT Goals (current goals can now be found in the care plan section) Acute Rehab PT Goals Patient Stated Goal: Rehab and home to resume previous lifestyle with decreased pain Time For Goal Achievement: 01/11/13 Potential to Achieve Goals: Good  Visit Information  Last PT Received On: 01/06/13 Assistance Needed: +1 History of Present Illness: L knee I and D with polyexchange    Subjective Data  Subjective: I don't want to get up pt says and laughs Patient Stated Goal: Rehab and home to resume previous lifestyle with decreased pain   Cognition  Cognition Arousal/Alertness: Awake/alert Behavior During Therapy: WFL for tasks assessed/performed Overall Cognitive Status: Within Functional Limits for tasks assessed    Balance     End of Session PT - End of Session Equipment Utilized During Treatment: Gait belt Activity Tolerance: Patient tolerated treatment well Patient left: in chair;with bed alarm set Nurse Communication: Mobility status CPM Left Knee CPM Left Knee: Off   GP     [facility] 01/06/2013, 11:07 AM

## 2013-01-07 ENCOUNTER — Encounter (HOSPITAL_COMMUNITY): Payer: Self-pay | Admitting: Orthopaedic Surgery

## 2013-01-07 LAB — GLUCOSE, CAPILLARY
Glucose-Capillary: 112 mg/dL — ABNORMAL HIGH (ref 70–99)
Glucose-Capillary: 111 mg/dL — ABNORMAL HIGH (ref 70–99)
Glucose-Capillary: 156 mg/dL — ABNORMAL HIGH (ref 70–99)
Glucose-Capillary: 126 mg/dL — ABNORMAL HIGH (ref 70–99)

## 2013-01-07 LAB — CBC
HCT: 28 % — ABNORMAL LOW (ref 36.0–46.0)
Hemoglobin: 9.3 g/dL — ABNORMAL LOW (ref 12.0–15.0)
MCH: 29.3 pg (ref 26.0–34.0)
MCHC: 33.2 g/dL (ref 30.0–36.0)
Platelets: 416 10*3/uL — ABNORMAL HIGH (ref 150–400)
RDW: 14.5 % (ref 11.5–15.5)

## 2013-01-07 LAB — VANCOMYCIN, TROUGH: Vancomycin Tr: 10.7 ug/mL (ref 10.0–20.0)

## 2013-01-07 MED ORDER — VANCOMYCIN HCL IN DEXTROSE 1-5 GM/200ML-% IV SOLN
1000.0000 mg | Freq: Three times a day (TID) | INTRAVENOUS | Status: DC
  Administered 2013-01-07 – 2013-01-08 (×4): 1000 mg via INTRAVENOUS
  Filled 2013-01-07 (×5): qty 200

## 2013-01-07 NOTE — Progress Notes (Signed)
Subjective: 4 Days Post-Op Procedure(s) (LRB): IRRIGATION AND DEBRIDEMENT LEFT KNEE WITH POLY EXCHANGE (Left) Patient reports pain as mild.  Appreciate ID consult.  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  On vanc and rifampin.  WBC down to 15 today.  Objective: Vital signs in last 24 hours: Temp:  [98.5 F (36.9 C)-100.7 F (38.2 C)] 98.6 F (37 C) (12/09 0550) Pulse Rate:  [87-99] 93 (12/09 0550) Resp:  [20] 20 (12/09 0550) BP: (140-145)/(80-89) 145/82 mmHg (12/09 0550) SpO2:  [94 %-96 %] 96 % (12/09 0550)  Intake/Output from previous day: 12/08 0701 - 12/09 0700 In: 1330 [P.O.:1320; I.V.:10] Out: 3200 [Urine:3200] Intake/Output this shift:     Recent Labs  01/05/13 0513 01/06/13 0325 01/07/13 0520  HGB 9.3* 8.4* 9.3*    Recent Labs  01/06/13 0325 01/07/13 0520  WBC 16.4* 15.1*  RBC 2.92* 3.17*  HCT 25.7* 28.0*  PLT 421* 416*    Recent Labs  01/05/13 0513  NA 132*  K 3.5  CL 97  CO2 26  BUN 12  CREATININE 0.47*  GLUCOSE 122*  CALCIUM 8.5   No results found for this basename: LABPT, INR,  in the last 72 hours  Sensation intact distally Intact pulses distally Dorsiflexion/Plantar flexion intact Incision: moderate drainage Compartment soft  Assessment/Plan: 4 Days Post-Op Procedure(s) (LRB): IRRIGATION AND DEBRIDEMENT LEFT KNEE WITH POLY EXCHANGE (Left) Up with therapy Continue ABX therapy due to Post-op infection Plan for discharge tomorrow  Given the amount of drainage today that I was able to express from her knee and the fact that her WBC is trending down, but not low enough yet, I would like to keep her here today.  I'll check on her incision this evening.  Also will check CBC in am 12/10.   Avonne Berkery Y 01/07/2013, 7:43 AM

## 2013-01-07 NOTE — Progress Notes (Signed)
Patient ID: Katherine [name], female   DOB: [birth year], 67 y.o.   MRN: [id number]         [facility]    Date of Admission:  01/02/2013   Total days of antibiotics 5         Principal Problem:   Infection of left total knee replacement Active Problems:   DIABETES MELLITUS, TYPE II   HYPERLIPIDEMIA   HYPERTENSION   GERD   OSTEOARTHRITIS   Normocytic anemia   . amLODipine  5 mg Oral q morning - 10a  . atorvastatin  40 mg Oral q morning - 10a  . azelastine  1 spray Each Nare BID  . benztropine  0.5 mg Oral BID  . Chlorhexidine Gluconate Cloth  6 each Topical Q0600  . docusate sodium  100 mg Oral BID  . doxazosin  8 mg Oral q morning - 10a  . fluticasone  2 spray Each Nare Daily  . insulin aspart  0-15 Units Subcutaneous TID WC  . insulin aspart  0-5 Units Subcutaneous QHS  . ipratropium  2 spray Each Nare TID  . loratadine  10 mg Oral Daily  . multivitamin with minerals  1 tablet Oral Daily  . mupirocin ointment  1 application Nasal BID  . pantoprazole  40 mg Oral Daily  . pregabalin  75 mg Oral BID  . rifampin  300 mg Oral Q12H  . risperiDONE  3 mg Oral QHS  . sodium chloride  10-40 mL Intracatheter Q12H  . vancomycin  1,000 mg Intravenous Q8H    Subjective: She is feeling better.  Past Medical History  Diagnosis Date  . ABDOMINAL PAIN, LOWER 02/04/2010  . ABNORMAL THYROID FUNCTION TESTS 08/04/2009  . ALLERGIC RHINITIS 09/05/2006  . ANXIETY 07/31/2007  . DEPRESSION 09/05/2006  . DIVERTICULOSIS, COLON 09/05/2006  . Dysuria 04/15/2010  . Failure to thrive in childhood 04/06/2010  . FATIGUE 04/15/2010  . FIBROMYALGIA 05/10/2009  . FRACTURE, PELVIS, RIGHT 04/15/2010  . GASTROENTERITIS, ACUTE 12/25/2008  . GERD 09/05/2006  . HYPERLIPIDEMIA 09/05/2006  . HYPERTENSION 09/05/2006  . KNEE PAIN, LEFT 02/04/2010  . OSTEOARTHRITIS 09/05/2006  . OSTEOPOROSIS 07/31/2007  . Other specified forms of hearing loss 11/01/2009  . SINUSITIS- ACUTE-NOS 10/09/2007  . SKIN LESION  05/10/2009  . SPRAIN&STRAIN OTH SPEC SITES SHOULDER&UPPER ARM 11/01/2009  . Unspecified hearing loss 10/09/2007  . URI 02/20/2007  . UTI 02/20/2007  . Blood transfusion   . Cataract     resolved -surgery done  . Heart murmur   . DIABETES MELLITUS, TYPE II 07/31/2007    no meds    History  Substance Use Topics  . Smoking status: Never Smoker   . Smokeless tobacco: Not on file  . Alcohol Use: No    Family History  Problem Relation Age of Onset  . Coronary artery disease Other   . Diabetes Other   . Colon cancer Neg Hx   . Esophageal cancer Neg Hx   . Stomach cancer Neg Hx   . Rectal cancer Neg Hx     Allergies  Allergen Reactions  . Penicillins     "made me nervous"  . Thioridazine Hcl Itching    Objective: Temp:  [98.6 F (37 C)-100.7 F (38.2 C)] 99.1 F (37.3 C) (12/09 1402) Pulse Rate:  [87-93] 93 (12/09 1402) Resp:  [20] 20 (12/09 1402) BP: (137-145)/(80-93) 137/93 mmHg (12/09 1402) SpO2:  [94 %-96 %] 95 % (12/09 1402)  General: She is in no  distress visiting with friends. Skin: No rash Dry gauze dressing over left knee  Lab Results Lab Results  Component Value Date   WBC 15.1* 01/07/2013   HGB 9.3* 01/07/2013   HCT 28.0* 01/07/2013   MCV 88.3 01/07/2013   PLT 416* 01/07/2013    Lab Results  Component Value Date   CREATININE 0.47* 01/05/2013   BUN 12 01/05/2013   NA 132* 01/05/2013   K 3.5 01/05/2013   CL 97 01/05/2013   CO2 26 01/05/2013    Lab Results  Component Value Date   ALT 27 11/07/2012   AST 31 11/07/2012   ALKPHOS 65 11/07/2012   BILITOT 0.7 11/07/2012      Microbiology: Recent Results (from the past 240 hour(s))  URINE CULTURE     Status: None   Collection Time    01/03/13 11:27 AM      Result Value Range Status   Specimen Description URINE, CLEAN CATCH   Final   Special Requests NONE   Final   Culture  Setup Time     Final   Value: 01/03/2013 16:53     Performed at [facility] Count     Final   Value: 55,000  COLONIES/ML     Performed at [facility]   Culture     Final   Value: Multiple bacterial morphotypes present, none predominant. Suggest appropriate recollection if clinically indicated.     Performed at [facility]   Report Status 01/04/2013 FINAL   Final  GRAM STAIN     Status: None   Collection Time    01/03/13  3:40 PM      Result Value Range Status   Specimen Description SYNOVIAL LEFT KNEE SUPERFICIAL   Final   Special Requests NONE   Final   Gram Stain     Final   Value: NO WBC SEEN     RARE GRAM POSITIVE COCCI IN PAIRS     Gram Stain Report Called to,Read Back By and Verified With: PALIN,T. AT 1728 ON LOVE,T.   Report Status 01/03/2013 FINAL   Final  BODY FLUID CULTURE     Status: None   Collection Time    01/03/13  3:40 PM      Result Value Range Status   Specimen Description SYNOVIAL LEFT KNEE SUPERFICIAL   Final   Special Requests NONE   Final   Gram Stain     Final   Value: NO WBC SEEN     RARE GRAM POSITIVE COCCI IN PAIRS     Performed by [facility] Gram Stain Report Called to,Read Back By and Verified With: Gram Stain Report Called to,Read Back By and Verified With: PALIN T. AT 1728 ON 01/03/13 BY LOVE T.     Performed at [facility]     Final   Value: FEW METHICILLIN RESISTANT STAPHYLOCOCCUS AUREUS     Note: RIFAMPIN AND GENTAMICIN SHOULD NOT BE USED AS SINGLE DRUGS FOR TREATMENT OF STAPH INFECTIONS. This organism DOES NOT demonstrate inducible Clindamycin resistance in vitro. CRITICAL RESULT CALLED TO, READ BACK BY AND VERIFIED WITH: EVELYN O @ 0912      01/06/13 BY KRAWS     Performed at [facility]   Report Status 01/06/2013 FINAL   Final   Organism ID, Bacteria METHICILLIN RESISTANT STAPHYLOCOCCUS AUREUS   Final  ANAEROBIC CULTURE     Status: None   Collection Time  01/03/13  3:40 PM      Result Value Range Status   Specimen Description SYNOVIAL LEFT KNEE SUPERFICIAL   Final   Special Requests NONE    Final   Gram Stain     Final   Value: NO WBC SEEN     RARE GRAM POSITIVE COCCI IN PAIRS     Performed by [facility] Gram Stain Report Called to,Read Back By and Verified With: Gram Stain Report Called to,Read Back By and Verified With: PALIN T. AT 1728 ON 01/03/13 BY LOVE T.     Performed at [facility]     Final   Value: NO ANAEROBES ISOLATED; CULTURE IN PROGRESS FOR 5 DAYS     Performed at [facility]   Report Status PENDING   Incomplete  GRAM STAIN     Status: None   Collection Time    01/03/13  3:44 PM      Result Value Range Status   Specimen Description SYNOVIAL LEFT KNEE DEEP   Final   Special Requests NONE   Final   Gram Stain     Final   Value: RARE WBC PRESENT, PREDOMINANTLY PMN     FEW GRAM POSITIVE COCCI IN PAIRS     Gram Stain Report Called to,Read Back By and Verified With: PALIN,T. AT 1728 ON 12.05.14 BY LOVE,T.   Report Status 01/03/2013 FINAL   Final  BODY FLUID CULTURE     Status: None   Collection Time    01/03/13  3:44 PM      Result Value Range Status   Specimen Description SYNOVIAL LEFT KNEE DEEP   Final   Special Requests NONE   Final   Gram Stain     Final   Value: RARE WBC PRESENT, PREDOMINANTLY PMN     FEW GRAM POSITIVE COCCI IN PAIRS     Performed by [facility] Gram Stain Report Called to,Read Back By and Verified With: Gram Stain Report Called to,Read Back By and Verified With: PALIN T. AT 1728 ON 01/03/13 BY LOVE T.     Performed at [facility]     Final   Value: MODERATE METHICILLIN RESISTANT STAPHYLOCOCCUS AUREUS     Note: RIFAMPIN AND GENTAMICIN SHOULD NOT BE USED AS SINGLE DRUGS FOR TREATMENT OF STAPH INFECTIONS. This organism DOES NOT demonstrate inducible Clindamycin resistance in vitro. CRITICAL RESULT CALLED TO, READ BACK BY AND VERIFIED WITH: EVELYN O @ 0912      01/06/13 BY KRAWS     Performed at [facility]   Report Status 01/06/2013 FINAL   Final   Organism ID,  Bacteria METHICILLIN RESISTANT STAPHYLOCOCCUS AUREUS   Final  ANAEROBIC CULTURE     Status: None   Collection Time    01/03/13  3:44 PM      Result Value Range Status   Specimen Description SYNOVIAL LEFT KNEE DEEP   Final   Special Requests NONE   Final   Gram Stain     Final   Value: RARE WBC PRESENT, PREDOMINANTLY PMN     FEW GRAM POSITIVE COCCI IN PAIRS     Gram Stain Report Called to,Read Back By and Verified With: Gram Stain Report Called to,Read Back By and Verified With: PALIN T. AT 1728 ON 01/03/13 BY LOVE T. 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  Plan: 1. Continue IV vancomycin and oral rifampin at least 6 weeks postop 2. I will arrange followup in my clinic  Cliffton Asters, MD [facility] for Infectious Disease [facility] [phone] pager   [phone] cell 01/07/2013, 3:25 PM

## 2013-01-07 NOTE — Progress Notes (Signed)
ANTIBIOTIC CONSULT NOTE   Pharmacy Consult for vancomycin Indication: left knee infection post TKA  Allergies  Allergen Reactions  . Penicillins     "made me nervous"  . Thioridazine Hcl Itching    Patient Measurements: Height: 4\' 11"  (149.9 cm) Weight: 193 lb 12.6 oz (87.9 kg) IBW/kg (Calculated) : 43.2  Vital Signs: Temp: 98.6 F (37 C) (12/09 0550) Temp src: Oral (12/09 0550) BP: 145/82 mmHg (12/09 0550) Pulse Rate: 93 (12/09 0550) Intake/Output from previous day: 12/08 0701 - 12/09 0700 In: 1330 [P.O.:1320; I.V.:10] Out: 3200 [Urine:3200] Intake/Output from this shift:    Labs:  Recent Labs  01/05/13 0513 01/06/13 0325 01/07/13 0520  WBC 14.5* 16.4* 15.1*  HGB 9.3* 8.4* 9.3*  PLT 463* 421* 416*  CREATININE 0.47*  --   --    Estimated Creatinine Clearance: 65.8 ml/min (by C-G formula based on Cr of 0.47).  Recent Labs  01/05/13 1523 01/07/13 0520  VANCOTROUGH 5.8* 10.7     Microbiology: Recent Results (from the past 720 hour(s))  URINE CULTURE     Status: None   Collection Time    12/17/12 11:25 AM      Result Value Range Status   Specimen Description URINE, CLEAN CATCH   Final   Special Requests NONE   Final   Culture  Setup Time     Final   Value: 12/18/2012 00:53     Performed at [facility] Count     Final   Value: 50,000 COLONIES/ML     Performed at [facility]   Culture     Final   Value: Multiple bacterial morphotypes present, none predominant. Suggest appropriate recollection if clinically indicated.     Performed at [facility]   Report Status 12/18/2012 FINAL   Final  SURGICAL PCR SCREEN     Status: None   Collection Time    12/17/12 11:45 AM      Result Value Range Status   MRSA, PCR NEGATIVE  NEGATIVE Final   Staphylococcus aureus NEGATIVE  NEGATIVE Final   Comment:            The Xpert SA Assay (FDA     approved for NASAL specimens     in patients over 70 years of age),     is one  component of     a comprehensive surveillance     program.  Test performance has     been validated by [employer] for patients greater     than or equal to 31 year old.     It is not intended     to diagnose infection nor to     guide or monitor treatment.  URINE CULTURE     Status: None   Collection Time    01/03/13 11:27 AM      Result Value Range Status   Specimen Description URINE, CLEAN CATCH   Final   Special Requests NONE   Final   Culture  Setup Time     Final   Value: 01/03/2013 16:53     Performed at [facility] Count     Final   Value: 55,000 COLONIES/ML     Performed at [facility]   Culture     Final   Value: Multiple bacterial morphotypes present, none predominant. Suggest appropriate recollection if clinically indicated.     Performed at [facility]  Report Status 01/04/2013 FINAL   Final  GRAM STAIN     Status: None   Collection Time    01/03/13  3:40 PM      Result Value Range Status   Specimen Description SYNOVIAL LEFT KNEE SUPERFICIAL   Final   Special Requests NONE   Final   Gram Stain     Final   Value: NO WBC SEEN     RARE GRAM POSITIVE COCCI IN PAIRS     Gram Stain Report Called to,Read Back By and Verified With: PALIN,T. AT 1728 ON LOVE,T.   Report Status 01/03/2013 FINAL   Final  BODY FLUID CULTURE     Status: None   Collection Time    01/03/13  3:40 PM      Result Value Range Status   Specimen Description SYNOVIAL LEFT KNEE SUPERFICIAL   Final   Special Requests NONE   Final   Gram Stain     Final   Value: NO WBC SEEN     RARE GRAM POSITIVE COCCI IN PAIRS     Performed by [facility] Gram Stain Report Called to,Read Back By and Verified With: Gram Stain Report Called to,Read Back By and Verified With: PALIN T. AT 1728 ON 01/03/13 BY LOVE T.     Performed at [facility]     Final   Value: FEW METHICILLIN RESISTANT STAPHYLOCOCCUS AUREUS     Note: RIFAMPIN AND GENTAMICIN SHOULD  NOT BE USED AS SINGLE DRUGS FOR TREATMENT OF STAPH INFECTIONS. This organism DOES NOT demonstrate inducible Clindamycin resistance in vitro. CRITICAL RESULT CALLED TO, READ BACK BY AND VERIFIED WITH: EVELYN O @ 0912      01/06/13 BY KRAWS     Performed at [facility]   Report Status 01/06/2013 FINAL   Final   Organism ID, Bacteria METHICILLIN RESISTANT STAPHYLOCOCCUS AUREUS   Final  ANAEROBIC CULTURE     Status: None   Collection Time    01/03/13  3:40 PM      Result Value Range Status   Specimen Description SYNOVIAL LEFT KNEE SUPERFICIAL   Final   Special Requests NONE   Final   Gram Stain     Final   Value: NO WBC SEEN     RARE GRAM POSITIVE COCCI IN PAIRS     Performed by [facility] Gram Stain Report Called to,Read Back By and Verified With: Gram Stain Report Called to,Read Back By and Verified With: PALIN T. AT 1728 ON 01/03/13 BY LOVE T.     Performed at [facility]     Final   Value: NO ANAEROBES ISOLATED; CULTURE IN PROGRESS FOR 5 DAYS     Performed at [facility]   Report Status PENDING   Incomplete  GRAM STAIN     Status: None   Collection Time    01/03/13  3:44 PM      Result Value Range Status   Specimen Description SYNOVIAL LEFT KNEE DEEP   Final   Special Requests NONE   Final   Gram Stain     Final   Value: RARE WBC PRESENT, PREDOMINANTLY PMN     FEW GRAM POSITIVE COCCI IN PAIRS     Gram Stain Report Called to,Read Back By and Verified With: PALIN,T. AT 1728 ON 12.05.14 BY LOVE,T.   Report Status 01/03/2013 FINAL   Final  BODY FLUID CULTURE     Status: None  Collection Time    01/03/13  3:44 PM      Result Value Range Status   Specimen Description SYNOVIAL LEFT KNEE DEEP   Final   Special Requests NONE   Final   Gram Stain     Final   Value: RARE WBC PRESENT, PREDOMINANTLY PMN     FEW GRAM POSITIVE COCCI IN PAIRS     Performed by [facility] Gram Stain Report Called to,Read Back By and Verified With: Gram  Stain Report Called to,Read Back By and Verified With: PALIN T. AT 1728 ON 01/03/13 BY LOVE T.     Performed at [facility]     Final   Value: MODERATE METHICILLIN RESISTANT STAPHYLOCOCCUS AUREUS     Note: RIFAMPIN AND GENTAMICIN SHOULD NOT BE USED AS SINGLE DRUGS FOR TREATMENT OF STAPH INFECTIONS. This organism DOES NOT demonstrate inducible Clindamycin resistance in vitro. CRITICAL RESULT CALLED TO, READ BACK BY AND VERIFIED WITH: EVELYN O @ 0912      01/06/13 BY KRAWS     Performed at [facility]   Report Status 01/06/2013 FINAL   Final   Organism ID, Bacteria METHICILLIN RESISTANT STAPHYLOCOCCUS AUREUS   Final  ANAEROBIC CULTURE     Status: None   Collection Time    01/03/13  3:44 PM      Result Value Range Status   Specimen Description SYNOVIAL LEFT KNEE DEEP   Final   Special Requests NONE   Final   Gram Stain     Final   Value: RARE WBC PRESENT, PREDOMINANTLY PMN     FEW GRAM POSITIVE COCCI IN PAIRS     Gram Stain Report Called to,Read Back By and Verified With: Gram Stain Report Called to,Read Back By and Verified With: PALIN T. AT 1728 ON 01/03/13 BY LOVE T. Performed by [facility]     Performed at [facility]   Culture     Final   Value: NO ANAEROBES ISOLATED; CULTURE IN PROGRESS FOR 5 DAYS     Performed at [facility]   Report Status PENDING   Incomplete    Medical History: Past Medical History  Diagnosis Date  . ABDOMINAL PAIN, LOWER 02/04/2010  . ABNORMAL THYROID FUNCTION TESTS 08/04/2009  . ALLERGIC RHINITIS 09/05/2006  . ANXIETY 07/31/2007  . DEPRESSION 09/05/2006  . DIVERTICULOSIS, COLON 09/05/2006  . Dysuria 04/15/2010  . Failure to thrive in childhood 04/06/2010  . FATIGUE 04/15/2010  . FIBROMYALGIA 05/10/2009  . FRACTURE, PELVIS, RIGHT 04/15/2010  . GASTROENTERITIS, ACUTE 12/25/2008  . GERD 09/05/2006  . HYPERLIPIDEMIA 09/05/2006  . HYPERTENSION 09/05/2006  . KNEE PAIN, LEFT 02/04/2010  . OSTEOARTHRITIS 09/05/2006  .  OSTEOPOROSIS 07/31/2007  . Other specified forms of hearing loss 11/01/2009  . SINUSITIS- ACUTE-NOS 10/09/2007  . SKIN LESION 05/10/2009  . SPRAIN&STRAIN OTH SPEC SITES SHOULDER&UPPER ARM 11/01/2009  . Unspecified hearing loss 10/09/2007  . URI 02/20/2007  . UTI 02/20/2007  . Blood transfusion   . Cataract     resolved -surgery done  . Heart murmur   . DIABETES MELLITUS, TYPE II 07/31/2007    no meds    Medications:  Scheduled:  . amLODipine  5 mg Oral q morning - 10a  . atorvastatin  40 mg Oral q morning - 10a  . azelastine  1 spray Each Nare BID  . benztropine  0.5 mg Oral BID  . Chlorhexidine Gluconate Cloth  6 each Topical Q0600  .  docusate sodium  100 mg Oral BID  . doxazosin  8 mg Oral q morning - 10a  . fluticasone  2 spray Each Nare Daily  . insulin aspart  0-15 Units Subcutaneous TID WC  . insulin aspart  0-5 Units Subcutaneous QHS  . ipratropium  2 spray Each Nare TID  . loratadine  10 mg Oral Daily  . multivitamin with minerals  1 tablet Oral Daily  . mupirocin ointment  1 application Nasal BID  . pantoprazole  40 mg Oral Daily  . pregabalin  75 mg Oral BID  . rifampin  300 mg Oral Q12H  . risperiDONE  3 mg Oral QHS  . sodium chloride  10-40 mL Intracatheter Q12H  . vancomycin  750 mg Intravenous Q8H   Infusions:  . 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  Plan is for 6 weeks of IV vancomycin and oral rifampin followed by 6 months of oral antibiotics for joint salvage and cure per ID consult.  12/5 >> vancomycin >> 12/8 >> rifampin >>  Tmax: 100.7 WBCs: remains elevated, 15.1 Renal: Scr 0.47 stable, CrCl 66 CG  12/5 knee synovial superficial x3: MRSA 12/5 knee synovial deep x3: MRSA 12/5 urine: 55K multiple bacteria  Dose changes/drug level info:  12/7 1530 VT = 5.8  mcg/ml on 1gm IV q12h (prior to 5th dose) 12/9 0520  VT = 10.7 mcg/ml on 750 mg IV q8h  Goal of Therapy:  Vancomycin trough level 15-20 mcg/ml  Plan:   Vancomycin trough low.  Will adjust dose to 1g IV q8h.   If unable to do q8h dosing at SNF the will need q12h regimen (? 1500mg  IV q12h) but will not likely achieve goal trough.   Clance Boll, PharmD, BCPS Pager: [phone] 01/07/2013 10:57 AM

## 2013-01-07 NOTE — Progress Notes (Signed)
01/07/13 1400  PT Visit Information  Last PT Received On 01/07/13  Assistance Needed +1  History of Present Illness L knee I and D with polyexchange  PT Time Calculation  PT Start Time 1431  PT Stop Time 1445  PT Time Calculation (min) 14 min  Subjective Data  Subjective I am cold  Precautions  Precautions Knee;Fall  Cognition  Arousal/Alertness Awake/alert  Behavior During Therapy WFL for tasks assessed/performed  Overall Cognitive Status Within Functional Limits for tasks assessed  Bed Mobility  Bed Mobility Sit to Supine  Sit to Supine 4: Min assist  Details for Bed Mobility Assistance cues for sequence and use of R LE to self assist  Transfers  Transfers Sit to Stand;Stand to Sit  Sit to Stand 4: Min guard;5: Supervision  Stand to Sit 5: Supervision;4: Min guard  Details for Transfer Assistance cues for LE management and use of UEs to self assist  Ambulation/Gait  Ambulation/Gait Assistance 4: Min guard;5: Supervision  Ambulation Distance (Feet) 5 Feet  Assistive device Rolling walker  Ambulation/Gait Assistance Details cues for RW safety; pt requiring incr time and more verbal cues, not feeling well this pm  Gait Pattern Step-to pattern;Decreased step length - right;Decreased step length - left;Shuffle;Antalgic  Gait velocity decr  General Gait Details min/guard for safety  Total Joint Exercises  Ankle Circles/Pumps AROM;Supine;Both;20 reps  Quad Sets AROM;Both;Supine;15 reps  Heel Slides AAROM;Left;10 reps;Supine  PT - End of Session  Activity Tolerance Patient tolerated treatment well  Patient left with call bell/phone within reach;with family/visitor present;in bed  Nurse Communication Mobility status;Other (comment)  PT - Assessment/Plan  PT Plan Current plan remains appropriate  PT Frequency Min 6X/week  Recommendations for Other Services OT consult  Follow Up Recommendations SNF  PT equipment Rolling walker with 5" wheels  PT Goal Progression  Progress  towards PT goals Progressing toward goals  Acute Rehab PT Goals  PT Goal Formulation With patient  Time For Goal Achievement 01/11/13  Potential to Achieve Goals Good  PT General Charges  $$ ACUTE PT VISIT 1 Procedure  PT Treatments  $Therapeutic Exercise 8-22 mins

## 2013-01-07 NOTE — Progress Notes (Signed)
SNF bed available at [facility] tomorrow if pt is stable for d/c.  Cori Razor LCSW [phone]

## 2013-01-07 NOTE — Progress Notes (Signed)
Physical Therapy Treatment Patient Details Name: Katherine [name] MRN: [id number] DOB: 23-May-1945 Today's Date: 01/07/2013 Time: 1150-1207 PT Time Calculation (min): 17 min  PT Assessment / Plan / Recommendation  History of Present Illness L knee I and D with polyexchange   PT Comments   Pt progressing, amb further today with less DOE   Follow Up Recommendations  SNF     Does the patient have the potential to tolerate intense rehabilitation     Barriers to Discharge        Equipment Recommendations  Rolling walker with 5" wheels    Recommendations for Other Services OT consult  Frequency Min 6X/week   Progress towards PT Goals Progress towards PT goals: Progressing toward goals  Plan Current plan remains appropriate    Precautions / Restrictions Precautions Precautions: Knee;Fall   Pertinent Vitals/Pain     Mobility  Bed Mobility Bed Mobility: Supine to Sit Supine to Sit: 5: Supervision Details for Bed Mobility Assistance: cues for sequence and use of R LE to self assist Transfers Transfers: Sit to Stand;Stand to Sit Sit to Stand: 4: Min guard;5: Supervision Stand to Sit: 5: Supervision;4: Min guard Details for Transfer Assistance: cues for LE management and use of UEs to self assist Ambulation/Gait Ambulation/Gait Assistance: 4: Min guard;5: Supervision Ambulation Distance (Feet): 90 Feet Assistive device: Rolling walker Ambulation/Gait Assistance Details: cues for Rw distance from self Gait Pattern: Step-to pattern;Decreased step length - right;Decreased step length - left;Shuffle;Antalgic    Exercises     PT Diagnosis:    PT Problem List:   PT Treatment Interventions:     PT Goals (current goals can now be found in the care plan section) Acute Rehab PT Goals Patient Stated Goal: Rehab and home to resume previous lifestyle with decreased pain PT Goal Formulation: With patient Potential to Achieve Goals: Good  Visit Information  Last PT Received On:  01/07/13 Assistance Needed: +1 History of Present Illness: L knee I and D with polyexchange    Subjective Data  Patient Stated Goal: Rehab and home to resume previous lifestyle with decreased pain   Cognition  Cognition Arousal/Alertness: Awake/alert Behavior During Therapy: WFL for tasks assessed/performed Overall Cognitive Status: Within Functional Limits for tasks assessed    Balance     End of Session PT - End of Session Activity Tolerance: Patient tolerated treatment well Patient left: with call bell/phone within reach;with nursing/sitter in room;in chair Nurse Communication: Mobility status;Other (comment)   GP     [facility] 01/07/2013, 1:04 PM

## 2013-01-08 LAB — ANAEROBIC CULTURE

## 2013-01-08 LAB — CBC
Hemoglobin: 9.4 g/dL — ABNORMAL LOW (ref 12.0–15.0)
MCH: 28.9 pg (ref 26.0–34.0)
MCHC: 32.8 g/dL (ref 30.0–36.0)
MCV: 88.3 fL (ref 78.0–100.0)
Platelets: 380 10*3/uL (ref 150–400)
RDW: 14.4 % (ref 11.5–15.5)

## 2013-01-08 LAB — GLUCOSE, CAPILLARY: Glucose-Capillary: 87 mg/dL (ref 70–99)

## 2013-01-08 MED ORDER — HEPARIN SOD (PORK) LOCK FLUSH 100 UNIT/ML IV SOLN
250.0000 [IU] | INTRAVENOUS | Status: AC | PRN
  Administered 2013-01-08: 250 [IU]

## 2013-01-08 MED ORDER — OXYCODONE-ACETAMINOPHEN 5-325 MG PO TABS
1.0000 | ORAL_TABLET | ORAL | Status: DC | PRN
Start: 2013-01-08 — End: 2013-01-09

## 2013-01-08 MED ORDER — VANCOMYCIN HCL IN DEXTROSE 1-5 GM/200ML-% IV SOLN: 1500.0000 mg | Freq: Two times a day (BID) | INTRAVENOUS | Status: DC

## 2013-01-08 MED ORDER — RIFAMPIN 300 MG PO CAPS: 300.0000 mg | ORAL_CAPSULE | Freq: Two times a day (BID) | ORAL | Status: DC

## 2013-01-08 NOTE — Progress Notes (Signed)
Subjective: 5 Days Post-Op Procedure(s) (LRB): IRRIGATION AND DEBRIDEMENT LEFT KNEE WITH POLY EXCHANGE (Left) Patient reports pain as mild.   No complaints. Objective: Vital signs in last 24 hours: Temp:  [97.6 F (36.4 C)-101.1 F (38.4 C)] 97.6 F (36.4 C) (12/10 0615) Pulse Rate:  [91-93] 91 (12/10 0615) Resp:  [20] 20 (12/10 0615) BP: (130-138)/(80-93) 138/81 mmHg (12/10 0615) SpO2:  [95 %-96 %] 96 % (12/10 0615)  Intake/Output from previous day: 12/09 0701 - 12/10 0700 In: 21438.3 [P.O.:1920; I.V.:19068.3; IV Piggyback:450] Out: 4250 [Urine:4250] Intake/Output this shift:     Recent Labs  01/06/13 0325 01/07/13 0520 01/08/13 0430  HGB 8.4* 9.3* 9.4*    Recent Labs  01/07/13 0520 01/08/13 0430  WBC 15.1* 12.3*  RBC 3.17* 3.25*  HCT 28.0* 28.7*  PLT 416* 380   No results found for this basename: NA, K, CL, CO2, BUN, CREATININE, GLUCOSE, CALCIUM,  in the last 72 hours No results found for this basename: LABPT, INR,  in the last 72 hours  Neurovascular intact Intact pulses distally Incision: moderate drainage Incision well approximated with staples Assessment/Plan: 5 Days Post-Op Procedure(s) (LRB): IRRIGATION AND DEBRIDEMENT LEFT KNEE WITH POLY EXCHANGE (Left) Discharge to SNF WBC trending down  Katherine [name] 01/08/2013, 8:11 AM

## 2013-01-08 NOTE — Progress Notes (Signed)
Patient ID: Katherine [name], female   DOB: [birth year], 67 y.o.   MRN: [id number]         [facility] for Infectious Disease    Date of Admission:  01/02/2013   Total days of antibiotics 6         Principal Problem:   Infection of left total knee replacement Active Problems:   DIABETES MELLITUS, TYPE II   HYPERLIPIDEMIA   HYPERTENSION   GERD   OSTEOARTHRITIS   Normocytic anemia   . amLODipine  5 mg Oral q morning - 10a  . atorvastatin  40 mg Oral q morning - 10a  . azelastine  1 spray Each Nare BID  . benztropine  0.5 mg Oral BID  . Chlorhexidine Gluconate Cloth  6 each Topical Q0600  . docusate sodium  100 mg Oral BID  . doxazosin  8 mg Oral q morning - 10a  . fluticasone  2 spray Each Nare Daily  . insulin aspart  0-15 Units Subcutaneous TID WC  . insulin aspart  0-5 Units Subcutaneous QHS  . ipratropium  2 spray Each Nare TID  . loratadine  10 mg Oral Daily  . multivitamin with minerals  1 tablet Oral Daily  . mupirocin ointment  1 application Nasal BID  . pantoprazole  40 mg Oral Daily  . pregabalin  75 mg Oral BID  . rifampin  300 mg Oral Q12H  . risperiDONE  3 mg Oral QHS  . sodium chloride  10-40 mL Intracatheter Q12H  . vancomycin  1,000 mg Intravenous Q8H    Subjective: Her knee pain is improving slowly.  Objective: Temp:  [97.6 F (36.4 C)-101.1 F (38.4 C)] 97.6 F (36.4 C) (12/10 0615) Pulse Rate:  [91] 91 (12/10 0615) Resp:  [20] 20 (12/10 0615) BP: (130-138)/(80-81) 138/81 mmHg (12/10 0615) SpO2:  [95 %-96 %] 96 % (12/10 0615)  General: She is preparing to be discharged to [facility] or skilled nursing facility. She is in no distress She is still having some slight left knee incision drainage but it is decreasing  Lab Results Lab Results  Component Value Date   WBC 12.3* 01/08/2013   HGB 9.4* 01/08/2013   HCT 28.7* 01/08/2013   MCV 88.3 01/08/2013   PLT 380 01/08/2013    Lab Results  Component Value Date   CREATININE 0.47*  01/05/2013   BUN 12 01/05/2013   NA 132* 01/05/2013   K 3.5 01/05/2013   CL 97 01/05/2013   CO2 26 01/05/2013    Lab Results  Component Value Date   ALT 27 11/07/2012   AST 31 11/07/2012   ALKPHOS 65 11/07/2012   BILITOT 0.7 11/07/2012      Microbiology: Recent Results (from the past 240 hour(s))  URINE CULTURE     Status: None   Collection Time    01/03/13 11:27 AM      Result Value Range Status   Specimen Description URINE, CLEAN CATCH   Final   Special Requests NONE   Final   Culture  Setup Time     Final   Value: 01/03/2013 16:53     Performed at [facility] Count     Final   Value: 55,000 COLONIES/ML     Performed at [facility]   Culture     Final   Value: Multiple bacterial morphotypes present, none predominant. Suggest appropriate recollection if clinically indicated.     Performed at [facility]  Report Status 01/04/2013 FINAL   Final  GRAM STAIN     Status: None   Collection Time    01/03/13  3:40 PM      Result Value Range Status   Specimen Description SYNOVIAL LEFT KNEE SUPERFICIAL   Final   Special Requests NONE   Final   Gram Stain     Final   Value: NO WBC SEEN     RARE GRAM POSITIVE COCCI IN PAIRS     Gram Stain Report Called to,Read Back By and Verified With: PALIN,T. AT 1728 ON LOVE,T.   Report Status 01/03/2013 FINAL   Final  BODY FLUID CULTURE     Status: None   Collection Time    01/03/13  3:40 PM      Result Value Range Status   Specimen Description SYNOVIAL LEFT KNEE SUPERFICIAL   Final   Special Requests NONE   Final   Gram Stain     Final   Value: NO WBC SEEN     RARE GRAM POSITIVE COCCI IN PAIRS     Performed by [facility] Gram Stain Report Called to,Read Back By and Verified With: Gram Stain Report Called to,Read Back By and Verified With: PALIN T. AT 1728 ON 01/03/13 BY LOVE T.     Performed at [facility]     Final   Value: FEW METHICILLIN RESISTANT STAPHYLOCOCCUS AUREUS      Note: RIFAMPIN AND GENTAMICIN SHOULD NOT BE USED AS SINGLE DRUGS FOR TREATMENT OF STAPH INFECTIONS. This organism DOES NOT demonstrate inducible Clindamycin resistance in vitro. CRITICAL RESULT CALLED TO, READ BACK BY AND VERIFIED WITH: EVELYN O @ 0912      01/06/13 BY KRAWS     Performed at [facility]   Report Status 01/06/2013 FINAL   Final   Organism ID, Bacteria METHICILLIN RESISTANT STAPHYLOCOCCUS AUREUS   Final  ANAEROBIC CULTURE     Status: None   Collection Time    01/03/13  3:40 PM      Result Value Range Status   Specimen Description SYNOVIAL LEFT KNEE SUPERFICIAL   Final   Special Requests NONE   Final   Gram Stain     Final   Value: NO WBC SEEN     RARE GRAM POSITIVE COCCI IN PAIRS     Performed by [facility] Gram Stain Report Called to,Read Back By and Verified With: Gram Stain Report Called to,Read Back By and Verified With: PALIN T. AT 1728 ON 01/03/13 BY LOVE T.     Performed at [facility]     Final   Value: NO ANAEROBES ISOLATED     Performed at [facility]   Report Status 01/08/2013 FINAL   Final  GRAM STAIN     Status: None   Collection Time    01/03/13  3:44 PM      Result Value Range Status   Specimen Description SYNOVIAL LEFT KNEE DEEP   Final   Special Requests NONE   Final   Gram Stain     Final   Value: RARE WBC PRESENT, PREDOMINANTLY PMN     FEW GRAM POSITIVE COCCI IN PAIRS     Gram Stain Report Called to,Read Back By and Verified With: PALIN,T. AT 1728 ON 12.05.14 BY LOVE,T.   Report Status 01/03/2013 FINAL   Final  BODY FLUID CULTURE     Status: None   Collection Time  01/03/13  3:44 PM      Result Value Range Status   Specimen Description SYNOVIAL LEFT KNEE DEEP   Final   Special Requests NONE   Final   Gram Stain     Final   Value: RARE WBC PRESENT, PREDOMINANTLY PMN     FEW GRAM POSITIVE COCCI IN PAIRS     Performed by [facility] Gram Stain Report Called to,Read Back By and Verified With:  Gram Stain Report Called to,Read Back By and Verified With: PALIN T. AT 1728 ON 01/03/13 BY LOVE T.     Performed at [facility]     Final   Value: MODERATE METHICILLIN RESISTANT STAPHYLOCOCCUS AUREUS     Note: RIFAMPIN AND GENTAMICIN SHOULD NOT BE USED AS SINGLE DRUGS FOR TREATMENT OF STAPH INFECTIONS. This organism DOES NOT demonstrate inducible Clindamycin resistance in vitro. CRITICAL RESULT CALLED TO, READ BACK BY AND VERIFIED WITH: EVELYN O @ 0912      01/06/13 BY KRAWS     Performed at [facility]   Report Status 01/06/2013 FINAL   Final   Organism ID, Bacteria METHICILLIN RESISTANT STAPHYLOCOCCUS AUREUS   Final  ANAEROBIC CULTURE     Status: None   Collection Time    01/03/13  3:44 PM      Result Value Range Status   Specimen Description SYNOVIAL LEFT KNEE DEEP   Final   Special Requests NONE   Final   Gram Stain     Final   Value: RARE WBC PRESENT, PREDOMINANTLY PMN     FEW GRAM POSITIVE COCCI IN PAIRS     Gram Stain Report Called to,Read Back By and Verified With: Gram Stain Report Called to,Read Back By and Verified With: PALIN T. AT 1728 ON 01/03/13 BY LOVE T. Performed by [facility]     Performed at [facility]   Culture     Final   Value: NO ANAEROBES ISOLATED     Performed at [facility]   Report Status 01/08/2013 FINAL   Final    Studies/Results: No results found.  Assessment: Slow improvement on therapy for MRSA left prosthetic knee infection.  Plan: 1. Continue IV vancomycin and oral rifampin for 6 weeks then convert to oral regimen for up to 6 months total therapy 2. I will arrange followup in our clinic within the next few weeks  Cliffton Asters, MD [facility] for Infectious Disease [facility] [phone] pager   [phone] cell 01/08/2013, 4:00 PM

## 2013-01-08 NOTE — Progress Notes (Signed)
Patient d/c to [facility] via ambulance. Stable on d/c.- Hulda Marin RN

## 2013-01-08 NOTE — Progress Notes (Signed)
Patient's d/c instructions rendered to patient,verbalized understanding.- Hulda Marin RN

## 2013-01-08 NOTE — Discharge Summary (Signed)
Patient ID: Katherine [name] MRN: [id number] DOB/AGE: May 26, 1945 67 y.o.  Admit date: 01/02/2013 Discharge date: 01/08/2013  Admission Diagnoses:  Principal Problem:   Infection of left total knee replacement Active Problems:   DIABETES MELLITUS, TYPE II   HYPERLIPIDEMIA   HYPERTENSION   GERD   OSTEOARTHRITIS   Normocytic anemia   Discharge Diagnoses:  Infected left total knee arthroplasty MRSA  Past Medical History  Diagnosis Date  . ABDOMINAL PAIN, LOWER 02/04/2010  . ABNORMAL THYROID FUNCTION TESTS 08/04/2009  . ALLERGIC RHINITIS 09/05/2006  . ANXIETY 07/31/2007  . DEPRESSION 09/05/2006  . DIVERTICULOSIS, COLON 09/05/2006  . Dysuria 04/15/2010  . Failure to thrive in childhood 04/06/2010  . FATIGUE 04/15/2010  . FIBROMYALGIA 05/10/2009  . FRACTURE, PELVIS, RIGHT 04/15/2010  . GASTROENTERITIS, ACUTE 12/25/2008  . GERD 09/05/2006  . HYPERLIPIDEMIA 09/05/2006  . HYPERTENSION 09/05/2006  . KNEE PAIN, LEFT 02/04/2010  . OSTEOARTHRITIS 09/05/2006  . OSTEOPOROSIS 07/31/2007  . Other specified forms of hearing loss 11/01/2009  . SINUSITIS- ACUTE-NOS 10/09/2007  . SKIN LESION 05/10/2009  . SPRAIN&STRAIN OTH SPEC SITES SHOULDER&UPPER ARM 11/01/2009  . Unspecified hearing loss 10/09/2007  . URI 02/20/2007  . UTI 02/20/2007  . Blood transfusion   . Cataract     resolved -surgery done  . Heart murmur   . DIABETES MELLITUS, TYPE II 07/31/2007    no meds    Surgeries: Procedure(s): IRRIGATION AND DEBRIDEMENT LEFT KNEE WITH POLY EXCHANGE on 01/02/2013 - 01/03/2013   Consultants:    Discharged Condition: Improved  Hospital Course: Katherine [name] is an 68 y.o. female who was admitted 01/02/2013 for operative treatment ofInfection of total knee replacement. Patient has severe unremitting pain that affects sleep, daily activities, and work/hobbies. After pre-op clearance the patient was taken to the operating room on 01/02/2013 - 01/03/2013 and underwent  Procedure(s): IRRIGATION AND DEBRIDEMENT LEFT KNEE WITH  POLY EXCHANGE.    Patient was given perioperative antibiotics: Anti-infectives   Start     Dose/Rate Route Frequency Ordered Stop   01/08/13 0000  rifampin (RIFADIN) 300 MG capsule     300 mg Oral Every 12 hours 01/08/13 0824     01/08/13 0000  vancomycin (VANCOCIN) 1 GM/200ML SOLN    Comments:  Total of 6 weeks post-op   1,500 mg 300 mL/hr over 60 Minutes Intravenous Every 12 hours 01/08/13 0824     01/07/13 1400  vancomycin (VANCOCIN) IVPB 1000 mg/200 mL premix     1,000 mg 200 mL/hr over 60 Minutes Intravenous 3 times per day 01/07/13 1104     01/06/13 1000  rifampin (RIFADIN) capsule 600 mg  Status:  Discontinued     600 mg Oral Every 12 hours 01/06/13 0726 01/06/13 0931   01/06/13 1000  rifampin (RIFADIN) capsule 300 mg     300 mg Oral Every 12 hours 01/06/13 0931     01/06/13 0000  vancomycin (VANCOCIN) IVPB 750 mg/150 ml premix  Status:  Discontinued     750 mg 150 mL/hr over 60 Minutes Intravenous 3 times per day 01/05/13 1633 01/07/13 1104   01/04/13 0400  vancomycin (VANCOCIN) IVPB 1000 mg/200 mL premix     1,000 mg 200 mL/hr over 60 Minutes Intravenous Every 12 hours 01/03/13 1939 01/05/13 1741   01/03/13 1555  tobramycin (NEBCIN) powder  Status:  Discontinued       As needed 01/03/13 1555 01/03/13 1641   01/03/13 1415  vancomycin (VANCOCIN) 1,000 mg in sodium chloride 0.9 % 250 mL IVPB  Status:  Discontinued     1,000 mg 250 mL/hr over 60 Minutes Intravenous On call to O.R. 01/03/13 1401 01/03/13 1406   01/03/13 1415  vancomycin (VANCOCIN) IVPB 1000 mg/200 mL premix     1,000 mg 200 mL/hr over 60 Minutes Intravenous  Once 01/03/13 1406 01/03/13 1554       Patient was given sequential compression devices, early ambulation, and chemoprophylaxis to prevent DVT.  Patient benefited maximally from hospital stay and there were no complications.    Recent vital signs: Patient Vitals for the past 24 hrs:  BP Temp Temp src Pulse Resp SpO2  01/08/13 0615 138/81 mmHg 97.6 F  (36.4 C) Oral 91 20 96 %  01/07/13 2140 - 99 F (37.2 C) Oral - - -  01/07/13 2125 130/80 mmHg 101.1 F (38.4 C) Oral 91 20 95 %  01/07/13 1402 137/93 mmHg 99.1 F (37.3 C) Oral 93 20 95 %     Recent laboratory studies:  Recent Labs  01/07/13 0520 01/08/13 0430  WBC 15.1* 12.3*  HGB 9.3* 9.4*  HCT 28.0* 28.7*  PLT 416* 380     Discharge Medications:     Medication List    STOP taking these medications       tiZANidine 4 MG tablet  Commonly known as:  ZANAFLEX      TAKE these medications       amLODipine 5 MG tablet  Commonly known as:  NORVASC  Take 5 mg by mouth every morning.     aspirin 325 MG EC tablet  Take 1 tablet (325 mg total) by mouth 2 (two) times daily after a meal.     atorvastatin 40 MG tablet  Commonly known as:  LIPITOR  Take 40 mg by mouth every morning.     azelastine 137 MCG/SPRAY nasal spray  Commonly known as:  ASTELIN  Place 1 spray into both nostrils 2 (two) times daily. Use in each nostril as directed     benztropine 0.5 MG tablet  Commonly known as:  COGENTIN  Take 0.5 mg by mouth 2 (two) times daily.     cetirizine 10 MG tablet  Commonly known as:  ZYRTEC  Take 10 mg by mouth daily.     doxazosin 8 MG tablet  Commonly known as:  CARDURA  Take 8 mg by mouth every morning.     etodolac 400 MG tablet  Commonly known as:  LODINE  Take 400 mg by mouth 2 (two) times daily.     fluticasone 50 MCG/ACT nasal spray  Commonly known as:  FLONASE  Place 2 sprays into both nostrils daily.     ipratropium 0.06 % nasal spray  Commonly known as:  ATROVENT  Place 1 spray into both nostrils 3 (three) times daily.     methocarbamol 500 MG tablet  Commonly known as:  ROBAXIN  Take 1 tablet (500 mg total) by mouth 2 (two) times daily.     mometasone 50 MCG/ACT nasal spray  Commonly known as:  NASONEX  Place 2 sprays into the nose daily.     multivitamin with minerals Tabs tablet  Take 1 tablet by mouth daily.     omeprazole 20 MG  capsule  Commonly known as:  PRILOSEC  Take 20 mg by mouth daily.     oxyCODONE-acetaminophen 5-325 MG per tablet  Commonly known as:  ROXICET  Take 1-2 tablets by mouth every 4 (four) hours as needed for severe pain.     pregabalin  75 MG capsule  Commonly known as:  LYRICA  Take 75 mg by mouth 2 (two) times daily.     promethazine 12.5 MG tablet  Commonly known as:  PHENERGAN  Take 12.5 mg by mouth every 6 (six) hours as needed for nausea or vomiting.     rifampin 300 MG capsule  Commonly known as:  RIFADIN  Take 1 capsule (300 mg total) by mouth every 12 (twelve) hours.     risperiDONE 3 MG tablet  Commonly known as:  RISPERDAL  Take 3 mg by mouth at bedtime.     vancomycin 1 GM/200ML Soln  Commonly known as:  VANCOCIN  Inject 300 mLs (1,500 mg total) into the vein every 12 (twelve) hours.        Diagnostic Studies: Dg Chest 2 View  12/17/2012   CLINICAL DATA:  Left total knee arthroplasty. Preop chest radiograph. History of a heart murmur.  EXAM: CHEST  2 VIEW  COMPARISON:  02/21/2010  FINDINGS: Cardiac silhouette is normal in size. Mediastinum is normal in contour. No hilar masses.  The lungs are clear. There is a marked compression fracture at the thoracolumbar junction, which appears chronic the bony thorax is demineralized but otherwise intact.  IMPRESSION: No acute cardiopulmonary disease.  Compression fracture at the thoracolumbar junction, most likely chronic. It was not present are lateral chest radiograph dated 07/20/2008, however.   Electronically Signed   By: Amie Portland M.D.   On: 12/17/2012 15:15   Dg Knee Left Port  12/20/2012   CLINICAL DATA:  Status post arthroplasty  EXAM: PORTABLE LEFT KNEE - 1-2 VIEW  COMPARISON:  None.  FINDINGS: Frontal and lateral views were obtained. The patient has had a total knee replacement with femoral and tibial prosthetic components appearing well-seated. There is a drain anterior to the proximal tibial component. There is no  fracture or dislocation. Soft tissue air is an expected postoperative finding.  IMPRESSION: Prosthetic components appear well seated. No fracture or dislocation.   Electronically Signed   By: Bretta Bang M.D.   On: 12/20/2012 10:26    Disposition: 03-Skilled Nursing Facility      Discharge Orders   Future Appointments Provider Department Dept Phone   05/09/2013 10:15 AM Corwin Levins, MD [facility] [phone]   Future Orders Complete By Expires   Discharge wound care:  As directed    Comments:     Change dressing daily left leg   Elevate operative extremity  As directed    Comments:     Encourage patient to wiggle toes often   Weight bearing as tolerated  As directed    Questions:     Laterality:  left   Extremity:  Lower     Patient on Vancomycin IV and Rifampin for a total of 6 weeks postop.  Follow up at 2 weeks post-op with Dr. Magnus Ivan  Signed: Chestine Spore, [place] Lone 01/08/2013, 8:25 AM

## 2013-01-08 NOTE — Progress Notes (Signed)
This Clinical research associate called the patient's sister and told her that ambulance can't take the patient's walker.I told her that I cleaned it and placed it in the storage room. [name] said she will come picked it up another day.Hulda Marin RN

## 2013-01-08 NOTE — Progress Notes (Signed)
Pt to be d/c to [facility] today for SNF placement. Pt / sister are aware and in agreement with d/c plan. [facility] has been contacted for transportation to SNF.  Cori Razor LCSW [phone]

## 2013-01-08 NOTE — Progress Notes (Signed)
Physical Therapy Treatment Patient Details Name: Katherine [name] MRN: [id number] DOB: [birth year] Today's Date: 01/08/2013 Time: 1442-1500 PT Time Calculation (min): 18 min  PT Assessment / Plan / Recommendation  History of Present Illness L knee I and D with polyexchange   PT Comments   Progressing, feeling much better today  Follow Up Recommendations  SNF     Does the patient have the potential to tolerate intense rehabilitation     Barriers to Discharge        Equipment Recommendations  Rolling walker with 5" wheels    Recommendations for Other Services OT consult  Frequency Min 6X/week   Progress towards PT Goals Progress towards PT goals: Progressing toward goals  Plan Current plan remains appropriate    Precautions / Restrictions Precautions Precautions: Knee;Fall Restrictions Weight Bearing Restrictions: No Other Position/Activity Restrictions: WBAT   Pertinent Vitals/Pain Min c/opain   Mobility  Bed Mobility Bed Mobility: Sit to Supine;Supine to Sit Supine to Sit: 5: Supervision Sit to Supine: 4: Min assist Details for Bed Mobility Assistance: cues for sequence and use of R LE to self assist Transfers Transfers: Sit to Stand;Stand to Sit Sit to Stand: 4: Min guard;5: Supervision Stand to Sit: 5: Supervision;4: Min guard Details for Transfer Assistance: cues for LE management and use of UEs to self assist Ambulation/Gait Ambulation/Gait Assistance: 4: Min guard;5: Supervision Ambulation Distance (Feet): 120 Feet (and 10' more) Assistive device: Rolling walker Ambulation/Gait Assistance Details: cues for RW distance from self and safety Gait Pattern: Step-to pattern;Decreased step length - right;Decreased step length - left;Shuffle;Antalgic    Exercises     PT Diagnosis:    PT Problem List:   PT Treatment Interventions:     PT Goals (current goals can now be found in the care plan section) Acute Rehab PT Goals Patient Stated Goal: Rehab and home to  resume previous lifestyle with decreased pain Time For Goal Achievement: 01/11/13 Potential to Achieve Goals: Good  Visit Information  Last PT Received On: 01/08/13 Assistance Needed: +1 History of Present Illness: L knee I and D with polyexchange    Subjective Data  Subjective: i feel better Patient Stated Goal: Rehab and home to resume previous lifestyle with decreased pain   Cognition  Cognition Arousal/Alertness: Awake/alert Behavior During Therapy: WFL for tasks assessed/performed Overall Cognitive Status: Within Functional Limits for tasks assessed    Balance     End of Session PT - End of Session Activity Tolerance: Patient tolerated treatment well Patient left: with call bell/phone within reach;with family/visitor present;in bed Nurse Communication: Mobility status;Other (comment)   GP     [facility] 01/08/2013, 3:05 PM

## 2013-01-08 NOTE — Progress Notes (Signed)
Report given to celestine Evans LPN at [facility]. Patient will be d/c later on this afternoon.- Hulda Marin RN

## 2013-01-09 ENCOUNTER — Other Ambulatory Visit: Payer: Self-pay | Admitting: *Deleted

## 2013-01-09 MED ORDER — PREGABALIN 75 MG PO CAPS: 75.0000 mg | ORAL_CAPSULE | Freq: Two times a day (BID) | ORAL | Status: DC

## 2013-01-09 MED ORDER — OXYCODONE-ACETAMINOPHEN 5-325 MG PO TABS: ORAL_TABLET | ORAL | Status: DC

## 2013-01-09 NOTE — Progress Notes (Signed)
Clinical Social Work Department CLINICAL SOCIAL WORK PLACEMENT NOTE 01/09/2013  Patient:  Katherine [name], Katherine [name]  Account Number:  [id number] Admit date:  01/02/2013  Clinical Social Worker:  Doroteo Glassman  Date/time:  01/05/2013 10:21 AM  Clinical Social Work is seeking post-discharge placement for this patient at the following level of care:   SKILLED NURSING   (*CSW will update this form in Epic as items are completed)   01/05/2013  Patient/family provided with [facility] Department of Clinical Social Work's list of facilities offering this level of care within the geographic area requested by the patient (or if unable, by the patient's family).  01/05/2013  Patient/family informed of their freedom to choose among providers that offer the needed level of care, that participate in Medicare, Medicaid or managed care program needed by the patient, have an available bed and are willing to accept the patient.  01/05/2013  Patient/family informed of [facility]' ownership interest in [facility], as well as of the fact that they are under no obligation to receive care at this facility.  PASARR submitted to EDS on  PASARR number received from EDS on   FL2 transmitted to all facilities in geographic area requested by pt/family on  01/05/2013 FL2 transmitted to all facilities within larger geographic area on   Patient informed that his/her managed care company has contracts with or will negotiate with  certain facilities, including the following:     Patient/family informed of bed offers received:  01/06/2013 Patient chooses bed at [facility] [place] Physician recommends and patient chooses bed at    Patient to be transferred to [facility] [place] on  01/08/2013 Patient to be transferred to facility by [facility]  The following physician request were entered in Epic:   Additional Comments:  Cori Razor LCSW [phone]

## 2013-01-10 ENCOUNTER — Non-Acute Institutional Stay (SKILLED_NURSING_FACILITY): Payer: PRIVATE HEALTH INSURANCE | Admitting: Internal Medicine

## 2013-01-10 ENCOUNTER — Encounter: Payer: Self-pay | Admitting: Internal Medicine

## 2013-01-10 DIAGNOSIS — K219 Gastro-esophageal reflux disease without esophagitis: Secondary | ICD-10-CM

## 2013-01-10 DIAGNOSIS — I10 Essential (primary) hypertension: Secondary | ICD-10-CM

## 2013-01-10 DIAGNOSIS — F329 Major depressive disorder, single episode, unspecified: Secondary | ICD-10-CM

## 2013-01-10 DIAGNOSIS — T889XXS Complication of surgical and medical care, unspecified, sequela: Secondary | ICD-10-CM

## 2013-01-10 DIAGNOSIS — F3289 Other specified depressive episodes: Secondary | ICD-10-CM

## 2013-01-10 DIAGNOSIS — E785 Hyperlipidemia, unspecified: Secondary | ICD-10-CM

## 2013-01-10 DIAGNOSIS — Z96659 Presence of unspecified artificial knee joint: Secondary | ICD-10-CM

## 2013-01-10 DIAGNOSIS — IMO0001 Reserved for inherently not codable concepts without codable children: Secondary | ICD-10-CM

## 2013-01-10 DIAGNOSIS — T8459XS Infection and inflammatory reaction due to other internal joint prosthesis, sequela: Secondary | ICD-10-CM

## 2013-01-10 NOTE — Progress Notes (Signed)
Patient ID: Katherine [name], female   DOB: 03-27-1945, 67 y.o.   MRN: [id number]  Katherine [name] living [place]    PCP: Oliver Barre, MD  Code Status: full code  Allergies  Allergen Reactions  . Penicillins     "made me nervous"  . Thioridazine Hcl Itching    Chief Complaint: new admit  HPI:  67 y/o female patient is here for STR after hospital admission from 01/02/13- 01/08/13 with infection of left total knee replacement. She was found to have MRSA infection after undergoing incision and debridement and had a poly placed. She was then started on antibiotics and is on vancomycin and rifampin for total of 6 weeks. She has hx of dm, htn, hyperlipidemia, GERD. She was seen in her room today. She complaints of pain in her left knee but her current pain medication has been helpful. Remains afebrile. No other complaints  Review of Systems  Constitutional: Negative for fever, chills, weight loss, malaise/fatigue and diaphoresis.  HENT: Negative for congestion, hearing loss and sore throat.   Eyes: Negative for blurred vision, double vision and discharge.  Respiratory: Negative for cough, sputum production, shortness of breath and wheezing.   Cardiovascular: Negative for chest pain, palpitations, orthopnea and leg swelling.  Gastrointestinal: Negative for heartburn, nausea, vomiting, abdominal pain, diarrhea and constipation.  Genitourinary: Negative for dysuria, urgency, frequency and flank pain.  Musculoskeletal: Negative for back pain, falls and myalgias.  Skin: Negative for itching and rash.  Neurological: Positive for weakness. Negative for dizziness, tingling, focal weakness and headaches.  Psychiatric/Behavioral: Negative for depression and memory loss. The patient is not nervous/anxious.    Past Medical History  Diagnosis Date  . ABDOMINAL PAIN, LOWER 02/04/2010  . ABNORMAL THYROID FUNCTION TESTS 08/04/2009  . ALLERGIC RHINITIS 09/05/2006  . ANXIETY 07/31/2007  . DEPRESSION 09/05/2006  .  DIVERTICULOSIS, COLON 09/05/2006  . Dysuria 04/15/2010  . Failure to thrive in childhood 04/06/2010  . FATIGUE 04/15/2010  . FIBROMYALGIA 05/10/2009  . FRACTURE, PELVIS, RIGHT 04/15/2010  . GASTROENTERITIS, ACUTE 12/25/2008  . GERD 09/05/2006  . HYPERLIPIDEMIA 09/05/2006  . HYPERTENSION 09/05/2006  . KNEE PAIN, LEFT 02/04/2010  . OSTEOARTHRITIS 09/05/2006  . OSTEOPOROSIS 07/31/2007  . Other specified forms of hearing loss 11/01/2009  . SINUSITIS- ACUTE-NOS 10/09/2007  . SKIN LESION 05/10/2009  . SPRAIN&STRAIN OTH SPEC SITES SHOULDER&UPPER ARM 11/01/2009  . Unspecified hearing loss 10/09/2007  . URI 02/20/2007  . UTI 02/20/2007  . Blood transfusion   . Cataract     resolved -surgery done  . Heart murmur   . DIABETES MELLITUS, TYPE II 07/31/2007    no meds   Past Surgical History  Procedure Laterality Date  . Tonsillectomy    . Breast biopsy    . Abdominal hysterectomy  12/08    Bladder tac, partial hysterectomy  . Inguinal herniorrhapy      right Dr. Freida Busman 06/2009  . Catarct removal      both eyes  . Total knee arthroplasty Left 12/20/2012    Procedure: LEFT TOTAL KNEE ARTHROPLASTY;  Surgeon: Kathryne Hitch, MD;  Location: [facility];  Service: Orthopedics;  Laterality: Left;  . I&d knee with poly exchange Left 01/03/2013    Procedure: IRRIGATION AND DEBRIDEMENT LEFT KNEE WITH POLY EXCHANGE;  Surgeon: Kathryne Hitch, MD;  Location: [facility];  Service: Orthopedics;  Laterality: Left;   Social History:   reports that she has never smoked. She does not have any smokeless tobacco history on file. She reports that she  does not drink alcohol or use illicit drugs.  Family History  Problem Relation Age of Onset  . Coronary artery disease Other   . Diabetes Other   . Colon cancer Neg Hx   . Esophageal cancer Neg Hx   . Stomach cancer Neg Hx   . Rectal cancer Neg Hx     Medications: Patient's Medications  New Prescriptions   No medications on file  Previous Medications   AMLODIPINE  (NORVASC) 5 MG TABLET    Take 5 mg by mouth every morning.   ASPIRIN EC 325 MG EC TABLET    Take 1 tablet (325 mg total) by mouth 2 (two) times daily after a meal.   ATORVASTATIN (LIPITOR) 40 MG TABLET    Take 40 mg by mouth every morning.   AZELASTINE (ASTELIN) 137 MCG/SPRAY NASAL SPRAY    Place 1 spray into both nostrils 2 (two) times daily. Use in each nostril as directed   BENZTROPINE (COGENTIN) 0.5 MG TABLET    Take 0.5 mg by mouth 2 (two) times daily.     CETIRIZINE (ZYRTEC) 10 MG TABLET    Take 10 mg by mouth daily.   DOXAZOSIN (CARDURA) 8 MG TABLET    Take 8 mg by mouth every morning.   ETODOLAC (LODINE) 400 MG TABLET    Take 400 mg by mouth 2 (two) times daily.   FLUTICASONE (FLONASE) 50 MCG/ACT NASAL SPRAY    Place 2 sprays into both nostrils daily.   IPRATROPIUM (ATROVENT) 0.06 % NASAL SPRAY    Place 1 spray into both nostrils 3 (three) times daily.   METHOCARBAMOL (ROBAXIN) 500 MG TABLET    Take 1 tablet (500 mg total) by mouth 2 (two) times daily.   MOMETASONE (NASONEX) 50 MCG/ACT NASAL SPRAY    Place 2 sprays into the nose daily.   MULTIPLE VITAMIN (MULTIVITAMIN WITH MINERALS) TABS TABLET    Take 1 tablet by mouth daily.   OMEPRAZOLE (PRILOSEC) 20 MG CAPSULE    Take 20 mg by mouth daily.   OXYCODONE-ACETAMINOPHEN (ROXICET) 5-325 MG PER TABLET    Take one  To two tablets by mouth every four hours as needed for pain   PREGABALIN (LYRICA) 75 MG CAPSULE    Take 1 capsule (75 mg total) by mouth 2 (two) times daily.   PROMETHAZINE (PHENERGAN) 12.5 MG TABLET    Take 12.5 mg by mouth every 6 (six) hours as needed for nausea or vomiting.   RIFAMPIN (RIFADIN) 300 MG CAPSULE    Take 1 capsule (300 mg total) by mouth every 12 (twelve) hours.   RISPERIDONE (RISPERDAL) 3 MG TABLET    Take 3 mg by mouth at bedtime.    VANCOMYCIN (VANCOCIN) 1 GM/200ML SOLN    Inject 300 mLs (1,500 mg total) into the vein every 12 (twelve) hours.  Modified Medications   No medications on file  Discontinued  Medications   No medications on file     Physical Exam: Filed Vitals:   01/10/13 1158  BP: 140/60  Pulse: 89  Temp: 97.5 F (36.4 C)  Resp: 18  Height: 5' (1.524 m)  Weight: 176 lb (79.833 kg)  SpO2: 96%   General- elderly female in no acute distress Head- atraumatic, normocephalic Eyes- PERRLA, EOMI, no pallor, no icterus, no discharge Neck- no lymphadenopathy, no thyromegaly, no jugular vein distension Cardiovascular- normal s1,s2, no murmurs/ rubs/ gallops Respiratory- bilateral clear to auscultation, no wheeze, no rhonchi, no crackles Abdomen- bowel sounds present, soft, non tender  Musculoskeletal- able to move all 4 extremities, left knee ROM limited, has staples in place on the incision, slight serous drainage, unsteady gait, using wheelchair at present  Neurological- no focal deficit, normal muscle strength Psychiatry- alert and oriented to person, place and normal mood and affect   Labs reviewed: Basic Metabolic Panel:  Recent Labs  91/47/82 1859 01/04/13 0332 01/05/13 0513  NA 131* 132* 132*  K 3.7 3.8 3.5  CL 96 98 97  CO2 24 25 26   GLUCOSE 144* 120* 122*  BUN 19 11 12   CREATININE 0.59 0.48* 0.47*  CALCIUM 8.7 8.2* 8.5   Liver Function Tests:  Recent Labs  05/09/12 1437 11/07/12 1549  AST 27 31  ALT 31 27  ALKPHOS 74 65  BILITOT 0.9 0.7  PROT 7.3 7.0  ALBUMIN 4.2 4.2   No results found for this basename: LIPASE, AMYLASE,  in the last 8760 hours No results found for this basename: AMMONIA,  in the last 8760 hours CBC:  Recent Labs  11/07/12 1549  01/06/13 0325 01/07/13 0520 01/08/13 0430  WBC 7.7  < > 16.4* 15.1* 12.3*  NEUTROABS 4.3  --   --   --   --   HGB 12.6  < > 8.4* 9.3* 9.4*  HCT 38.0  < > 25.7* 28.0* 28.7*  MCV 90.7  < > 88.0 88.3 88.3  PLT 267.0  < > 421* 416* 380  < > = values in this interval not displayed.   Radiological Exams: Dg Chest 2 View  12/17/2012   CLINICAL DATA:  Left total knee arthroplasty. Preop chest  radiograph. History of a heart murmur.  EXAM: CHEST  2 VIEW  COMPARISON:  02/21/2010  FINDINGS: Cardiac silhouette is normal in size. Mediastinum is normal in contour. No hilar masses.  The lungs are clear. There is a marked compression fracture at the thoracolumbar junction, which appears chronic the bony thorax is demineralized but otherwise intact.  IMPRESSION: No acute cardiopulmonary disease.  Compression fracture at the thoracolumbar junction, most likely chronic. It was not present are lateral chest radiograph dated 07/20/2008, however.   Electronically Signed   By: Amie Portland M.D.   On: 12/17/2012 15:15   Dg Knee Left Port  12/20/2012   CLINICAL DATA:  Status post arthroplasty  EXAM: PORTABLE LEFT KNEE - 1-2 VIEW  COMPARISON:  None.  FINDINGS: Frontal and lateral views were obtained. The patient has had a total knee replacement with femoral and tibial prosthetic components appearing well-seated. There is a drain anterior to the proximal tibial component. There is no fracture or dislocation. Soft tissue air is an expected postoperative finding.  IMPRESSION: Prosthetic components appear well seated. No fracture or dislocation.   Electronically Signed   By: Bretta Bang M.D.   On: 12/20/2012 10:26    Assessment/Plan  Left knee infection- MRSA infection of left knee arthroplasty and underwent drainage. To complete 6 weeks of vancomycin and rifampin. Follow vanc trough, temp curve and wbc count. Check cbc with diff. To work with PT and OT for gait training, skin care at incision site, fall precautions. On asa for dvt prophylaxis. Continue etodolac and roxicet prn for pain and robaxin for muscle spasm  Hypertension- continue amlodipine, cardura  GERD- continue her prilosec for now  Hyperlipidemia- continue current regimen of statin  Depression- her resperidone and cogentin have been helpful, monitor crrently with no changes in regimen  Fibromyalgia- continue her lyrica   Family/ staff  Communication: reviewed care plan with  patient and nursing supervisor   Goals of care: reviewed with patient and nursing supervisor   Labs/tests ordered: cbc with diff, cmp

## 2013-01-22 ENCOUNTER — Encounter: Payer: Self-pay | Admitting: Infectious Disease

## 2013-01-22 ENCOUNTER — Ambulatory Visit (INDEPENDENT_AMBULATORY_CARE_PROVIDER_SITE_OTHER): Payer: PRIVATE HEALTH INSURANCE | Admitting: Infectious Disease

## 2013-01-22 VITALS — BP 146/88 | HR 98 | Temp 98.5°F | Wt 164.5 lb

## 2013-01-22 DIAGNOSIS — T889XXS Complication of surgical and medical care, unspecified, sequela: Secondary | ICD-10-CM

## 2013-01-22 DIAGNOSIS — T8452XS Infection and inflammatory reaction due to internal left hip prosthesis, sequela: Secondary | ICD-10-CM

## 2013-01-22 DIAGNOSIS — T8131XA Disruption of external operation (surgical) wound, not elsewhere classified, initial encounter: Secondary | ICD-10-CM

## 2013-01-22 DIAGNOSIS — A4902 Methicillin resistant Staphylococcus aureus infection, unspecified site: Secondary | ICD-10-CM
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
## 2013-01-28 ENCOUNTER — Encounter: Payer: Self-pay | Admitting: Internal Medicine

## 2013-01-28 ENCOUNTER — Non-Acute Institutional Stay (SKILLED_NURSING_FACILITY): Payer: PRIVATE HEALTH INSURANCE | Admitting: Internal Medicine

## 2013-01-28 DIAGNOSIS — R609 Edema, unspecified: Secondary | ICD-10-CM

## 2013-01-28 DIAGNOSIS — I10 Essential (primary) hypertension: Secondary | ICD-10-CM

## 2013-01-28 NOTE — Progress Notes (Signed)
Patient ID: Katherine [name], female   DOB: April 07, 1945, 67 y.o.   MRN: [id number]  Location:  [facility] Provider:  Elmarie Shiley L. Renato Gails, D.O., C.M.D.  Chief Complaint  Patient presents with  . Acute Visit    blood pressure and medication review    HPI:  67 yo female here for short term rehab seen for acute visit due to persistent hypertension and she is hoping she can come off some of her meds.  Staff have noted increased edema in her lower extremities.  We discussed watching her sodium and fluid intake.  She is on amlodipine, but the edema is pitting. Review of Systems:  Review of Systems  Constitutional: Negative for malaise/fatigue.  Respiratory: Negative for shortness of breath.   Cardiovascular: Positive for leg swelling. Negative for chest pain.       Hypertension  Neurological: Negative for dizziness and loss of consciousness.    Medications: Patient's Medications  New Prescriptions   No medications on file  Previous Medications   AMLODIPINE (NORVASC) 5 MG TABLET    Take 5 mg by mouth every morning.   ASPIRIN EC 325 MG EC TABLET    Take 1 tablet (325 mg total) by mouth 2 (two) times daily after a meal.   ATORVASTATIN (LIPITOR) 40 MG TABLET    Take 40 mg by mouth every morning.   AZELASTINE (ASTELIN) 137 MCG/SPRAY NASAL SPRAY    Place 1 spray into both nostrils 2 (two) times daily. Use in each nostril as directed   BENZTROPINE (COGENTIN) 0.5 MG TABLET    Take 0.5 mg by mouth 2 (two) times daily.     CETIRIZINE (ZYRTEC) 10 MG TABLET    Take 10 mg by mouth daily.   DOXAZOSIN (CARDURA) 8 MG TABLET    Take 8 mg by mouth every morning.   ETODOLAC (LODINE) 400 MG TABLET    Take 400 mg by mouth 2 (two) times daily.   FLUTICASONE (FLONASE) 50 MCG/ACT NASAL SPRAY    Place 2 sprays into both nostrils daily.   IPRATROPIUM (ATROVENT) 0.06 % NASAL SPRAY    Place 1 spray into both nostrils 3 (three) times daily.   METHOCARBAMOL (ROBAXIN) 500 MG TABLET    Take 1 tablet (500 mg  total) by mouth 2 (two) times daily.   MOMETASONE (NASONEX) 50 MCG/ACT NASAL SPRAY    Place 2 sprays into the nose daily.   MULTIPLE VITAMIN (MULTIVITAMIN WITH MINERALS) TABS TABLET    Take 1 tablet by mouth daily.   OMEPRAZOLE (PRILOSEC) 20 MG CAPSULE    Take 20 mg by mouth daily.   PREGABALIN (LYRICA) 75 MG CAPSULE    Take 1 capsule (75 mg total) by mouth 2 (two) times daily.   PROMETHAZINE (PHENERGAN) 12.5 MG TABLET    Take 12.5 mg by mouth every 6 (six) hours as needed for nausea or vomiting.   RIFAMPIN (RIFADIN) 300 MG CAPSULE    Take 1 capsule (300 mg total) by mouth every 12 (twelve) hours.   RISPERIDONE (RISPERDAL) 3 MG TABLET    Take 3 mg by mouth at bedtime.   Modified Medications   Modified Medication Previous Medication   OXYCODONE-ACETAMINOPHEN (PERCOCET/ROXICET) 5-325 MG PER TABLET oxyCODONE-acetaminophen (ROXICET) 5-325 MG per tablet      1 tab po q 4 hours prn moderate pain, 2 tabs po q 4 hrs prn severe pain    Take one  To two tablets by mouth every four hours as needed for pain  VANCOMYCIN (VANCOCIN) 1 GM/200ML SOLN vancomycin (VANCOCIN) 1 GM/200ML SOLN      Inject 1,500 mg into the vein every 12 (twelve) hours.    Inject 300 mLs (1,500 mg total) into the vein every 12 (twelve) hours.  Discontinued Medications   No medications on file    Physical Exam: Filed Vitals:   01/28/13 1407  BP: 133/67  Pulse: 87  Temp: 98.1 F (36.7 C)  Resp: 20  Height: 5' (1.524 m)  Weight: 168 lb (76.204 kg)  SpO2: 97%  Physical Exam  Cardiovascular: Normal rate, regular rhythm, normal heart sounds and intact distal pulses.   2+ pitting edema  Pulmonary/Chest: Effort normal and breath sounds normal. She has no rales.  Abdominal: Soft. Bowel sounds are normal. She exhibits no distension and no mass. There is no tenderness.  Musculoskeletal: Normal range of motion.  Neurological: She is alert.  Skin: Skin is warm and dry.     Labs reviewed: Basic Metabolic Panel:  Recent Labs   01/02/13 1859 01/04/13 0332 01/05/13 0513  NA 131* 132* 132*  K 3.7 3.8 3.5  CL 96 98 97  CO2 24 25 26   GLUCOSE 144* 120* 122*  BUN 19 11 12   CREATININE 0.59 0.48* 0.47*  CALCIUM 8.7 8.2* 8.5    Liver Function Tests:  Recent Labs  05/09/12 1437 11/07/12 1549  AST 27 31  ALT 31 27  ALKPHOS 74 65  BILITOT 0.9 0.7  PROT 7.3 7.0  ALBUMIN 4.2 4.2    CBC:  Recent Labs  11/07/12 1549  01/06/13 0325 01/07/13 0520 01/08/13 0430  WBC 7.7  < > 16.4* 15.1* 12.3*  NEUTROABS 4.3  --   --   --   --   HGB 12.6  < > 8.4* 9.3* 9.4*  HCT 38.0  < > 25.7* 28.0* 28.7*  MCV 90.7  < > 88.0 88.3 88.3  PLT 267.0  < > 421* 416* 380  < > = values in this interval not displayed.   Assessment/Plan 1. Uncontrolled hypertension increase amlodipine to 10mg  po daily for bps running from 130s-160s, mostly 150s-160s systolic, diastolic 80s  2. Peripheral edema Lasix daily for 3 days for this Avoid high sodium foods and adding salt Elevate legs at rest Cont to monitor   Family/ staff Communication: seen with unit supervisor  Goals of care: here for short term rehab and will return home  Labs/tests ordered:  none

## 2013-02-11 ENCOUNTER — Non-Acute Institutional Stay (SKILLED_NURSING_FACILITY): Payer: PRIVATE HEALTH INSURANCE | Admitting: Internal Medicine

## 2013-02-11 DIAGNOSIS — F3289 Other specified depressive episodes: Secondary | ICD-10-CM

## 2013-02-11 DIAGNOSIS — F329 Major depressive disorder, single episode, unspecified: Secondary | ICD-10-CM

## 2013-02-11 DIAGNOSIS — E119 Type 2 diabetes mellitus without complications: Secondary | ICD-10-CM

## 2013-02-11 DIAGNOSIS — Z96659 Presence of unspecified artificial knee joint: Secondary | ICD-10-CM

## 2013-02-11 DIAGNOSIS — T8459XA Infection and inflammatory reaction due to other internal joint prosthesis, initial encounter: Secondary | ICD-10-CM

## 2013-02-11 DIAGNOSIS — T8450XA Infection and inflammatory reaction due to unspecified internal joint prosthesis, initial encounter: Secondary | ICD-10-CM
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
## 2013-02-13 ENCOUNTER — Encounter: Payer: Self-pay | Admitting: Infectious Disease

## 2013-02-13 ENCOUNTER — Ambulatory Visit (INDEPENDENT_AMBULATORY_CARE_PROVIDER_SITE_OTHER): Payer: PRIVATE HEALTH INSURANCE | Admitting: Infectious Disease

## 2013-02-13 VITALS — BP 132/90 | HR 102 | Temp 98.6°F | Wt 165.0 lb

## 2013-02-13 DIAGNOSIS — A4902 Methicillin resistant Staphylococcus aureus infection, unspecified site: Secondary | ICD-10-CM

## 2013-02-13 DIAGNOSIS — IMO0002 Reserved for concepts with insufficient information to code with codable children: Secondary | ICD-10-CM

## 2013-02-13 DIAGNOSIS — Z96659 Presence of unspecified artificial knee joint: Secondary | ICD-10-CM

## 2013-02-13 DIAGNOSIS — T8450XA Infection and inflammatory reaction due to unspecified internal joint prosthesis, initial encounter: Secondary | ICD-10-CM

## 2013-02-13 DIAGNOSIS — T8459XA Infection and inflammatory reaction due to other internal joint prosthesis, initial encounter: Secondary | ICD-10-CM

## 2013-02-13 DIAGNOSIS — T7491XA Unspecified adult maltreatment, confirmed, initial encounter: Secondary | ICD-10-CM

## 2013-02-13 DIAGNOSIS — T8131XA Disruption of external operation (surgical) wound, not elsewhere classified, initial encounter: Secondary | ICD-10-CM

## 2013-02-13 LAB — BASIC METABOLIC PANEL WITH GFR
BUN: 20 mg/dL (ref 6–23)
CO2: 27 mEq/L (ref 19–32)
Calcium: 9 mg/dL (ref 8.4–10.5)
Chloride: 99 mEq/L (ref 96–112)
Creat: 1.02 mg/dL (ref 0.50–1.10)
GFR, EST AFRICAN AMERICAN: 66 mL/min
GFR, Est Non African American: 57 mL/min — ABNORMAL LOW
GLUCOSE: 109 mg/dL — AB (ref 70–99)
Potassium: 4.4 mEq/L (ref 3.5–5.3)
SODIUM: 137 meq/L (ref 135–145)

## 2013-02-13 LAB — CBC WITH DIFFERENTIAL/PLATELET
Basophils Absolute: 0.1 10*3/uL (ref 0.0–0.1)
Basophils Relative: 1 % (ref 0–1)
EOS ABS: 0.4 10*3/uL (ref 0.0–0.7)
Eosinophils Relative: 6 % — ABNORMAL HIGH (ref 0–5)
HCT: 31.7 % — ABNORMAL LOW (ref 36.0–46.0)
HEMOGLOBIN: 10.4 g/dL — AB (ref 12.0–15.0)
LYMPHS PCT: 20 % (ref 12–46)
Lymphs Abs: 1.3 10*3/uL (ref 0.7–4.0)
MCH: 28.1 pg (ref 26.0–34.0)
MCHC: 32.8 g/dL (ref 30.0–36.0)
MCV: 85.7 fL (ref 78.0–100.0)
MONOS PCT: 11 % (ref 3–12)
Monocytes Absolute: 0.7 10*3/uL (ref 0.1–1.0)
NEUTROS ABS: 4.3 10*3/uL (ref 1.7–7.7)
NEUTROS PCT: 62 % (ref 43–77)
Platelets: 439 10*3/uL — ABNORMAL HIGH (ref 150–400)
RBC: 3.7 MIL/uL — AB (ref 3.87–5.11)
RDW: 15.9 % — ABNORMAL HIGH (ref 11.5–15.5)
WBC: 6.8 10*3/uL (ref 4.0–10.5)

## 2013-02-13 LAB — C-REACTIVE PROTEIN: CRP: 1.5 mg/dL — ABNORMAL HIGH (ref <0.60)

## 2013-02-13 LAB — SEDIMENTATION RATE: Sed Rate: 44 mm/hr — ABNORMAL HIGH (ref 0–22)

## 2013-02-13 MED ORDER — RIFAMPIN 300 MG PO CAPS: 300.0000 mg | ORAL_CAPSULE | Freq: Two times a day (BID) | ORAL | Status: DC

## 2013-02-13 MED ORDER — DOXYCYCLINE HYCLATE 100 MG PO TABS: 100.0000 mg | ORAL_TABLET | Freq: Two times a day (BID) | ORAL | Status: DC

## 2013-02-13 NOTE — Progress Notes (Signed)
RN received verbal order to discontinue the patient's PICC line.  Patient identified with name and date of birth. PICC dressing removed, site unremarkable.  PICC line removed using sterile procedure @ 1545. PICC length equal to that noted in patient's hospital chart of 40 cm. Sterile petroleum gauze + sterile 4X4 applied to PICC site, pressure applied for 10 minutes and covered with Medipore tape as a pressure dressing. Patient tolerated procedure without complaints.  Patient instructed to limit use of arm for 1 hour. Patient instructed that the pressure dressing should remain in place for 24 hours. Patient verbalized understanding of these instructions.
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
## 2013-02-17 ENCOUNTER — Telehealth: Payer: Self-pay | Admitting: Internal Medicine

## 2013-02-17 NOTE — Telephone Encounter (Signed)
Ok for verbal 

## 2013-02-17 NOTE — Telephone Encounter (Signed)
Katherine [name] called to cancel her Jan 23 appt.  She will call back in Feb.  She seems confused about her medications.  States they may have changed some while she was in rehab. (she just had knee replacement).  She also states her BP was high, but declined an appointment.  Please call her about her medications.

## 2013-02-17 NOTE — Telephone Encounter (Signed)
[facility] therapy twice a week for 4 weeks.

## 2013-02-19 NOTE — Telephone Encounter (Signed)
[phone] informed of MD ok.

## 2013-02-21 ENCOUNTER — Telehealth: Payer: Self-pay

## 2013-02-21 ENCOUNTER — Ambulatory Visit: Payer: PRIVATE HEALTH INSURANCE | Admitting: Internal Medicine

## 2013-02-21 NOTE — Telephone Encounter (Signed)
Ok for verbal 

## 2013-02-21 NOTE — Telephone Encounter (Signed)
[facility] would like a verbal to add OT for this patient.  Call back number is [phone]

## 2013-02-21 NOTE — Telephone Encounter (Signed)
Called [facility] left detailed message of MD verbal ok. 

## 2013-02-25 DIAGNOSIS — R262 Difficulty in walking, not elsewhere classified: Secondary | ICD-10-CM

## 2013-02-25 DIAGNOSIS — I10 Essential (primary) hypertension: Secondary | ICD-10-CM

## 2013-02-25 DIAGNOSIS — E119 Type 2 diabetes mellitus without complications: Secondary | ICD-10-CM

## 2013-02-25 DIAGNOSIS — E041 Nontoxic single thyroid nodule: Secondary | ICD-10-CM

## 2013-03-11 ENCOUNTER — Encounter: Payer: Self-pay | Admitting: Internal Medicine

## 2013-03-11 ENCOUNTER — Ambulatory Visit (INDEPENDENT_AMBULATORY_CARE_PROVIDER_SITE_OTHER): Payer: PRIVATE HEALTH INSURANCE | Admitting: Internal Medicine

## 2013-03-11 VITALS — BP 160/80 | HR 83 | Temp 98.2°F | Ht <= 58 in | Wt 156.5 lb

## 2013-03-11 DIAGNOSIS — E785 Hyperlipidemia, unspecified: Secondary | ICD-10-CM

## 2013-03-11 DIAGNOSIS — E119 Type 2 diabetes mellitus without complications: Secondary | ICD-10-CM

## 2013-03-11 DIAGNOSIS — I10 Essential (primary) hypertension: Secondary | ICD-10-CM

## 2013-03-11 NOTE — Progress Notes (Signed)
Subjective:    Patient ID: Katherine [name], female    DOB: 10/27/1945, 68 y.o.   MRN: [id number]  HPI  Here to f/u; overall doing ok,  Pt denies chest pain, increased sob or doe, wheezing, orthopnea, PND, increased LE swelling, palpitations, dizziness or syncope.  Pt denies polydipsia, polyuria, or low sugar symptoms such as weakness or confusion improved with po intake.  Pt denies new neurological symptoms such as new headache, or facial or extremity weakness or numbness.   Pt states overall good compliance with meds, has been trying to follow lower cholesterol diet, with wt overall stable,  but little exercise however.  Has seen Dr Daiva EvesVan Dam, with f/u 3/16. Sees mental health for tremors and anxiety.  Sees Dr Rayburn MaBlackmon regularly, may eventually need left shoulder surgury.  No other new complaints  Past Medical History  Diagnosis Date  . ABDOMINAL PAIN, LOWER 02/04/2010  . ABNORMAL THYROID FUNCTION TESTS 08/04/2009  . ALLERGIC RHINITIS 09/05/2006  . ANXIETY 07/31/2007  . DEPRESSION 09/05/2006  . DIVERTICULOSIS, COLON 09/05/2006  . Dysuria 04/15/2010  . Failure to thrive in childhood 04/06/2010  . FATIGUE 04/15/2010  . FIBROMYALGIA 05/10/2009  . FRACTURE, PELVIS, RIGHT 04/15/2010  . GASTROENTERITIS, ACUTE 12/25/2008  . GERD 09/05/2006  . HYPERLIPIDEMIA 09/05/2006  . HYPERTENSION 09/05/2006  . KNEE PAIN, LEFT 02/04/2010  . OSTEOARTHRITIS 09/05/2006  . OSTEOPOROSIS 07/31/2007  . Other specified forms of hearing loss 11/01/2009  . SINUSITIS- ACUTE-NOS 10/09/2007  . SKIN LESION 05/10/2009  . SPRAIN&STRAIN OTH SPEC SITES SHOULDER&UPPER ARM 11/01/2009  . Unspecified hearing loss 10/09/2007  . URI 02/20/2007  . UTI 02/20/2007  . Blood transfusion   . Cataract     resolved -surgery done  . Heart murmur   . DIABETES MELLITUS, TYPE II 07/31/2007    no meds   Past Surgical History  Procedure Laterality Date  . Tonsillectomy    . Breast biopsy    . Abdominal hysterectomy  12/08    Bladder tac, partial hysterectomy  .  Inguinal herniorrhapy      right Dr. Freida BusmanAllen 06/2009  . Catarct removal      both eyes  . 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 I&d knee with poly exchange Left 01/03/2013    Procedure: IRRIGATION AND DEBRIDEMENT LEFT KNEE WITH POLY EXCHANGE;  Surgeon: Kathryne Hitchhristopher Y Blackman, MD;  Location: [facility];  Service: Orthopedics;  Laterality: Left;    reports that she has never smoked. She does not have any smokeless tobacco history on file. She reports that she does not drink alcohol or use illicit drugs. family history includes Coronary artery disease in her other; Diabetes in her other. There is no history of Colon cancer, Esophageal cancer, Stomach cancer, or Rectal cancer. Allergies  Allergen Reactions  . Penicillins     "made me nervous"  . Thioridazine Hcl Itching   Current Outpatient Prescriptions on File Prior to Visit  Medication Sig Dispense Refill  . amLODipine (NORVASC) 5 MG tablet Take 5 mg by mouth every morning.      [name]. aspirin EC 325 MG EC tablet Take 1 tablet (325 mg total) by mouth 2 (two) times daily after a meal.  45 tablet  0  . atorvastatin (LIPITOR) 40 MG tablet Take 40 mg by mouth every morning.      [name]. azelastine (ASTELIN) 137 MCG/SPRAY  nasal spray Place 1 spray into both nostrils 2 (two) times daily. Use in each nostril as directed      . benztropine (COGENTIN) 0.5 MG tablet Take 0.5 mg by mouth 2 (two) times daily.        . cetirizine (ZYRTEC) 10 MG tablet Take 10 mg by mouth daily.      [name] doxazosin (CARDURA) 8 MG tablet Take 8 mg by mouth every morning.      [name] doxycycline (VIBRA-TABS) 100 MG tablet Take 1 tablet (100 mg total) by mouth 2 (two) times daily.  60 tablet  11  . etodolac (LODINE) 400 MG tablet Take 400 mg by mouth 2 (two) times daily.      . fluticasone (FLONASE) 50 MCG/ACT nasal spray Place 2 sprays into both  nostrils daily.      [name] ipratropium (ATROVENT) 0.06 % nasal spray Place 1 spray into both nostrils 3 (three) times daily.      . methocarbamol (ROBAXIN) 500 MG tablet Take 1 tablet (500 mg total) by mouth 2 (two) times daily.  40 tablet  0  . mometasone (NASONEX) 50 MCG/ACT nasal spray Place 2 sprays into the nose daily.      . Multiple Vitamin (MULTIVITAMIN WITH MINERALS) TABS tablet Take 1 tablet by mouth daily.      [name] omeprazole (PRILOSEC) 20 MG capsule Take 20 mg by mouth daily.      . pregabalin (LYRICA) 75 MG capsule Take 1 capsule (75 mg total) by mouth 2 (two) times daily.  60 capsule  5  . promethazine (PHENERGAN) 12.5 MG tablet Take 12.5 mg by mouth every 6 (six) hours as needed for nausea or vomiting.      . rifampin (RIFADIN) 300 MG capsule Take 1 capsule (300 mg total) by mouth every 12 (twelve) hours.  60 capsule  11  . risperiDONE (RISPERDAL) 3 MG tablet Take 3 mg by mouth at bedtime.        No current facility-administered medications on file prior to visit.   Review of Systems  Constitutional: Negative for unexpected weight change, or unusual diaphoresis  HENT: Negative for tinnitus.   Eyes: Negative for photophobia and visual disturbance.  Respiratory: Negative for choking and stridor.   Gastrointestinal: Negative for vomiting and blood in stool.  Genitourinary: Negative for hematuria and decreased urine volume.  Musculoskeletal: Negative for acute joint swelling Skin: Negative for color change and wound.  Neurological: Negative for tremors and numbness other than noted  Psychiatric/Behavioral: Negative for decreased concentration or  hyperactivity.       Objective:   Physical Exam BP 160/80  Pulse 83  Temp(Src) 98.2 F (36.8 C) (Oral)  Ht 4\' 10"  (1.473 m)  Wt 156 lb 8 oz (70.988 kg)  BMI 32.72 kg/m2  SpO2 99% VS noted,  Constitutional: Pt appears well-developed and well-nourished.  HENT: Head: NCAT.  Right Ear: External ear normal.  Left Ear: External ear  normal.  Eyes: Conjunctivae and EOM are normal. Pupils are equal, round, and reactive to light.  Neck: Normal range of motion. Neck supple.  Cardiovascular: Normal rate and regular rhythm.   Pulmonary/Chest: Effort normal and breath sounds normal.  Abd:  Soft, NT, non-distended, + BS Neurological: Pt is alert. Not confused  Skin: Skin is warm. No erythema.  Psychiatric: Pt behavior is normal. Thought content normal.     Assessment & Plan:

## 2013-03-11 NOTE — Patient Instructions (Signed)
Please continue all other medications as before, and refills have been done if requested. Please have the pharmacy call with any other refills you may need.  Your form will be faxed  No further lab work needed today

## 2013-03-13 ENCOUNTER — Telehealth: Payer: Self-pay | Admitting: Internal Medicine

## 2013-03-13 NOTE — Telephone Encounter (Signed)
Ok to take OTC MVI such as centrum silver or one a day

## 2013-03-13 NOTE — Telephone Encounter (Signed)
Katherine [name] wants us to call her [facility] pharmacy at [phone] ([phone] to order the Vitamins with minerals that Dr. Jonny RuizJohn wants her to take.

## 2013-03-14 NOTE — Telephone Encounter (Signed)
Patient informed. 

## 2013-03-15 NOTE — Assessment & Plan Note (Signed)
elev today liekly situational, but stable overall by history and exam, recent data reviewed with pt, and pt to continue medical treatment as before,  to f/u any worsening symptoms or concerns BP Readings from Last 3 Encounters:  03/11/13 160/80  02/13/13 132/90  01/28/13 133/67

## 2013-03-15 NOTE — Assessment & Plan Note (Signed)
stable overall by history and exam, recent data reviewed with pt, and pt to continue medical treatment as before,  to f/u any worsening symptoms or concerns Lab Results  Component Value Date   LDLCALC 58 11/07/2012    

## 2013-03-15 NOTE — Assessment & Plan Note (Signed)
stable overall by history and exam, recent data reviewed with pt, and pt to continue medical treatment as before,  to f/u any worsening symptoms or concerns Lab Results  Component Value Date   LDLCALC 58 11/07/2012

## 2013-03-19 ENCOUNTER — Other Ambulatory Visit: Payer: Self-pay

## 2013-03-19 MED ORDER — PROMETHAZINE HCL 12.5 MG PO TABS: 12.5000 mg | ORAL_TABLET | Freq: Four times a day (QID) | ORAL | Status: DC | PRN

## 2013-04-02 ENCOUNTER — Telehealth: Payer: Self-pay

## 2013-04-02 MED ORDER — TIZANIDINE HCL 4 MG PO TABS: 4.0000 mg | ORAL_TABLET | Freq: Four times a day (QID) | ORAL | Status: DC | PRN

## 2013-04-02 NOTE — Telephone Encounter (Signed)
Patient called to inform her Methocarbamol is no longer covered by insurance.  Advise please

## 2013-04-02 NOTE — Telephone Encounter (Signed)
Patient informed of change. 

## 2013-04-02 NOTE — Telephone Encounter (Signed)
Called left message to call back 

## 2013-04-02 NOTE — Telephone Encounter (Signed)
Ok to stop, and change to tizanidine prn - done erx

## 2013-04-14 ENCOUNTER — Encounter: Payer: Self-pay | Admitting: Infectious Disease

## 2013-04-14 ENCOUNTER — Ambulatory Visit (INDEPENDENT_AMBULATORY_CARE_PROVIDER_SITE_OTHER): Payer: PRIVATE HEALTH INSURANCE | Admitting: Infectious Disease

## 2013-04-14 VITALS — BP 133/85 | HR 91 | Temp 98.5°F | Wt 151.0 lb

## 2013-04-14 DIAGNOSIS — T8450XA Infection and inflammatory reaction due to unspecified internal joint prosthesis, initial encounter: Secondary | ICD-10-CM

## 2013-04-14 DIAGNOSIS — T8454XA Infection and inflammatory reaction due to internal left knee prosthesis, initial encounter: Secondary | ICD-10-CM

## 2013-04-14 DIAGNOSIS — T8131XA Disruption of external operation (surgical) wound, not elsewhere classified, initial encounter: Secondary | ICD-10-CM

## 2013-04-14 DIAGNOSIS — A4902 Methicillin resistant Staphylococcus aureus infection, unspecified site: Secondary | ICD-10-CM

## 2013-04-14 DIAGNOSIS — Z96659 Presence of unspecified artificial knee joint: Secondary | ICD-10-CM

## 2013-04-14 LAB — CBC WITH DIFFERENTIAL/PLATELET
Basophils Absolute: 0 10*3/uL (ref 0.0–0.1)
Basophils Relative: 0 % (ref 0–1)
Eosinophils Absolute: 0.1 10*3/uL (ref 0.0–0.7)
Eosinophils Relative: 2 % (ref 0–5)
HCT: 36.9 % (ref 36.0–46.0)
HEMOGLOBIN: 12 g/dL (ref 12.0–15.0)
LYMPHS ABS: 2.2 10*3/uL (ref 0.7–4.0)
LYMPHS PCT: 31 % (ref 12–46)
MCH: 28.1 pg (ref 26.0–34.0)
MCHC: 32.5 g/dL (ref 30.0–36.0)
MCV: 86.4 fL (ref 78.0–100.0)
MONOS PCT: 7 % (ref 3–12)
Monocytes Absolute: 0.5 10*3/uL (ref 0.1–1.0)
NEUTROS PCT: 60 % (ref 43–77)
Neutro Abs: 4.2 10*3/uL (ref 1.7–7.7)
Platelets: 354 10*3/uL (ref 150–400)
RBC: 4.27 MIL/uL (ref 3.87–5.11)
RDW: 16.1 % — ABNORMAL HIGH (ref 11.5–15.5)
WBC: 7 10*3/uL (ref 4.0–10.5)

## 2013-04-14 LAB — SEDIMENTATION RATE: SED RATE: 1 mm/h (ref 0–22)

## 2013-04-14 MED ORDER — RIFAMPIN 300 MG PO CAPS: 300.0000 mg | ORAL_CAPSULE | Freq: Two times a day (BID) | ORAL | Status: DC

## 2013-04-14 MED ORDER — DOXYCYCLINE HYCLATE 100 MG PO TABS: 100.0000 mg | ORAL_TABLET | Freq: Two times a day (BID) | ORAL | Status: DC
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
## 2013-04-15 LAB — COMPLETE METABOLIC PANEL WITH GFR
ALT: 21 U/L (ref 0–35)
AST: 25 U/L (ref 0–37)
Albumin: 3.3 g/dL — ABNORMAL LOW (ref 3.5–5.2)
Alkaline Phosphatase: 119 U/L — ABNORMAL HIGH (ref 39–117)
BUN: 23 mg/dL (ref 6–23)
CO2: 30 mEq/L (ref 19–32)
Calcium: 9.7 mg/dL (ref 8.4–10.5)
Chloride: 98 mEq/L (ref 96–112)
Creat: 0.78 mg/dL (ref 0.50–1.10)
GFR, EST NON AFRICAN AMERICAN: 79 mL/min
GFR, Est African American: >89 mL/min
GLUCOSE: 95 mg/dL (ref 70–99)
Potassium: 4.2 mEq/L (ref 3.5–5.3)
Sodium: 137 mEq/L (ref 135–145)
TOTAL PROTEIN: 6 g/dL (ref 6.0–8.3)
Total Bilirubin: 0.4 mg/dL (ref 0.2–1.2)

## 2013-04-15 LAB — C-REACTIVE PROTEIN

## 2013-04-29 ENCOUNTER — Inpatient Hospital Stay (HOSPITAL_COMMUNITY)
Admission: EM | Admit: 2013-04-29 | Discharge: 2013-05-01 | DRG: 071 | Disposition: A | Discharge status: Home Health | Payer: PRIVATE HEALTH INSURANCE | Attending: Emergency Medicine | Admitting: Emergency Medicine

## 2013-04-29 ENCOUNTER — Emergency Department (HOSPITAL_COMMUNITY): Payer: PRIVATE HEALTH INSURANCE

## 2013-04-29 ENCOUNTER — Encounter (HOSPITAL_COMMUNITY): Payer: Self-pay | Admitting: Emergency Medicine

## 2013-04-29 DIAGNOSIS — R5381 Other malaise: Secondary | ICD-10-CM | POA: Diagnosis present

## 2013-04-29 DIAGNOSIS — Z96659 Presence of unspecified artificial knee joint: Secondary | ICD-10-CM

## 2013-04-29 DIAGNOSIS — E860 Dehydration: Secondary | ICD-10-CM | POA: Diagnosis present

## 2013-04-29 DIAGNOSIS — R29898 Other symptoms and signs involving the musculoskeletal system: Secondary | ICD-10-CM | POA: Diagnosis present

## 2013-04-29 DIAGNOSIS — J309 Allergic rhinitis, unspecified: Secondary | ICD-10-CM | POA: Diagnosis present

## 2013-04-29 DIAGNOSIS — T8450XA Infection and inflammatory reaction due to unspecified internal joint prosthesis, initial encounter: Secondary | ICD-10-CM | POA: Diagnosis present

## 2013-04-29 DIAGNOSIS — J9819 Other pulmonary collapse: Secondary | ICD-10-CM | POA: Diagnosis present

## 2013-04-29 DIAGNOSIS — R4182 Altered mental status, unspecified: Secondary | ICD-10-CM | POA: Diagnosis present

## 2013-04-29 DIAGNOSIS — G9341 Metabolic encephalopathy: Principal | ICD-10-CM | POA: Diagnosis present

## 2013-04-29 DIAGNOSIS — E876 Hypokalemia: Secondary | ICD-10-CM | POA: Diagnosis present

## 2013-04-29 DIAGNOSIS — R29810 Facial weakness: Secondary | ICD-10-CM | POA: Diagnosis present

## 2013-04-29 DIAGNOSIS — H9190 Unspecified hearing loss, unspecified ear: Secondary | ICD-10-CM | POA: Diagnosis present

## 2013-04-29 DIAGNOSIS — M797 Fibromyalgia: Secondary | ICD-10-CM | POA: Diagnosis present

## 2013-04-29 DIAGNOSIS — F411 Generalized anxiety disorder: Secondary | ICD-10-CM | POA: Diagnosis present

## 2013-04-29 DIAGNOSIS — F329 Major depressive disorder, single episode, unspecified: Secondary | ICD-10-CM

## 2013-04-29 DIAGNOSIS — M810 Age-related osteoporosis without current pathological fracture: Secondary | ICD-10-CM | POA: Diagnosis present

## 2013-04-29 DIAGNOSIS — M1990 Unspecified osteoarthritis, unspecified site: Secondary | ICD-10-CM | POA: Diagnosis present

## 2013-04-29 DIAGNOSIS — F3289 Other specified depressive episodes: Secondary | ICD-10-CM

## 2013-04-29 DIAGNOSIS — R5383 Other fatigue: Secondary | ICD-10-CM

## 2013-04-29 DIAGNOSIS — K219 Gastro-esophageal reflux disease without esophagitis: Secondary | ICD-10-CM | POA: Diagnosis present

## 2013-04-29 DIAGNOSIS — IMO0001 Reserved for inherently not codable concepts without codable children: Secondary | ICD-10-CM | POA: Diagnosis present

## 2013-04-29 DIAGNOSIS — Z7982 Long term (current) use of aspirin: Secondary | ICD-10-CM

## 2013-04-29 DIAGNOSIS — E119 Type 2 diabetes mellitus without complications: Secondary | ICD-10-CM

## 2013-04-29 DIAGNOSIS — I10 Essential (primary) hypertension: Secondary | ICD-10-CM | POA: Diagnosis present

## 2013-04-29 DIAGNOSIS — N390 Urinary tract infection, site not specified: Secondary | ICD-10-CM

## 2013-04-29 DIAGNOSIS — Y831 Surgical operation with implant of artificial internal device as the cause of abnormal reaction of the patient, or of later complication, without mention of misadventure at the time of the procedure: Secondary | ICD-10-CM | POA: Diagnosis present

## 2013-04-29 DIAGNOSIS — Z6829 Body mass index (BMI) 29.0-29.9, adult: Secondary | ICD-10-CM

## 2013-04-29 DIAGNOSIS — E7800 Pure hypercholesterolemia, unspecified: Secondary | ICD-10-CM | POA: Diagnosis present

## 2013-04-29 DIAGNOSIS — R51 Headache: Secondary | ICD-10-CM | POA: Diagnosis present

## 2013-04-29 DIAGNOSIS — E785 Hyperlipidemia, unspecified: Secondary | ICD-10-CM | POA: Diagnosis present

## 2013-04-29 DIAGNOSIS — Z79899 Other long term (current) drug therapy: Secondary | ICD-10-CM

## 2013-04-29 DIAGNOSIS — A4902 Methicillin resistant Staphylococcus aureus infection, unspecified site: Secondary | ICD-10-CM | POA: Diagnosis present

## 2013-04-29 DIAGNOSIS — T8459XA Infection and inflammatory reaction due to other internal joint prosthesis, initial encounter: Secondary | ICD-10-CM

## 2013-04-29 LAB — CBC WITH DIFFERENTIAL/PLATELET
BASOS ABS: 0 10*3/uL (ref 0.0–0.1)
BASOS PCT: 0 % (ref 0–1)
EOS ABS: 0 10*3/uL (ref 0.0–0.7)
Eosinophils Relative: 0 % (ref 0–5)
HEMATOCRIT: 42.8 % (ref 36.0–46.0)
Hemoglobin: 14.1 g/dL (ref 12.0–15.0)
Lymphocytes Relative: 16 % (ref 12–46)
Lymphs Abs: 1.5 10*3/uL (ref 0.7–4.0)
MCH: 28.7 pg (ref 26.0–34.0)
MCHC: 32.9 g/dL (ref 30.0–36.0)
MCV: 87 fL (ref 78.0–100.0)
MONO ABS: 0.5 10*3/uL (ref 0.1–1.0)
Monocytes Relative: 5 % (ref 3–12)
Neutro Abs: 7.5 10*3/uL (ref 1.7–7.7)
Neutrophils Relative %: 79 % — ABNORMAL HIGH (ref 43–77)
Platelets: 373 10*3/uL (ref 150–400)
RBC: 4.92 MIL/uL (ref 3.87–5.11)
RDW: 15.3 % (ref 11.5–15.5)
WBC: 9.6 10*3/uL (ref 4.0–10.5)

## 2013-04-29 LAB — I-STAT TROPONIN, ED: TROPONIN I, POC: 0.02 ng/mL (ref 0.00–0.08)

## 2013-04-29 LAB — COMPREHENSIVE METABOLIC PANEL
ALBUMIN: 4 g/dL (ref 3.5–5.2)
ALT: 55 U/L — ABNORMAL HIGH (ref 0–35)
AST: 62 U/L — AB (ref 0–37)
Alkaline Phosphatase: 88 U/L (ref 39–117)
BILIRUBIN TOTAL: 0.4 mg/dL (ref 0.3–1.2)
BUN: 19 mg/dL (ref 6–23)
CALCIUM: 9.8 mg/dL (ref 8.4–10.5)
CHLORIDE: 101 meq/L (ref 96–112)
CO2: 27 mEq/L (ref 19–32)
CREATININE: 0.8 mg/dL (ref 0.50–1.10)
GFR calc Af Amer: 86 mL/min — ABNORMAL LOW (ref >90)
GFR calc non Af Amer: 75 mL/min — ABNORMAL LOW (ref >90)
Glucose, Bld: 219 mg/dL — ABNORMAL HIGH (ref 70–99)
Potassium: 3.5 mEq/L — ABNORMAL LOW (ref 3.7–5.3)
Sodium: 144 mEq/L (ref 137–147)
TOTAL PROTEIN: 7.9 g/dL (ref 6.0–8.3)

## 2013-04-29 LAB — URINE MICROSCOPIC-ADD ON

## 2013-04-29 LAB — GLUCOSE, CAPILLARY
Glucose-Capillary: 90 mg/dL (ref 70–99)
Glucose-Capillary: 104 mg/dL — ABNORMAL HIGH (ref 70–99)

## 2013-04-29 LAB — URINALYSIS, ROUTINE W REFLEX MICROSCOPIC
Glucose, UA: NEGATIVE mg/dL
Hgb urine dipstick: NEGATIVE
Ketones, ur: 15 mg/dL — AB
Nitrite: NEGATIVE
PH: 6 (ref 5.0–8.0)
Protein, ur: 30 mg/dL — AB
Specific Gravity, Urine: 1.021 (ref 1.005–1.030)
Urobilinogen, UA: 0.2 mg/dL (ref 0.0–1.0)

## 2013-04-29 LAB — I-STAT CG4 LACTIC ACID, ED: LACTIC ACID, VENOUS: 2.02 mmol/L (ref 0.5–2.2)

## 2013-04-29 LAB — TROPONIN I
Troponin I: <0.3 ng/mL
Troponin I: <0.3 ng/mL (ref <0.30)

## 2013-04-29 LAB — LIPASE, BLOOD: LIPASE: 10 U/L — AB (ref 11–59)

## 2013-04-29 MED ORDER — RISPERIDONE 3 MG PO TABS
3.0000 mg | ORAL_TABLET | Freq: Every day | ORAL | Status: DC
  Administered 2013-04-29 – 2013-04-30 (×2): 3 mg via ORAL
  Filled 2013-04-29 (×3): qty 1

## 2013-04-29 MED ORDER — ATORVASTATIN CALCIUM 40 MG PO TABS
40.0000 mg | ORAL_TABLET | Freq: Every morning | ORAL | Status: DC
  Administered 2013-04-29 – 2013-05-01 (×3): 40 mg via ORAL
  Filled 2013-04-29 (×3): qty 1

## 2013-04-29 MED ORDER — CIPROFLOXACIN IN D5W 400 MG/200ML IV SOLN
400.0000 mg | Freq: Two times a day (BID) | INTRAVENOUS | Status: DC
  Administered 2013-04-30 (×2): 400 mg via INTRAVENOUS
  Filled 2013-04-29 (×4): qty 200

## 2013-04-29 MED ORDER — TRAMADOL HCL ER 200 MG PO TB24: 200.0000 mg | ORAL_TABLET | Freq: Every day | ORAL | Status: DC | PRN

## 2013-04-29 MED ORDER — ENOXAPARIN SODIUM 40 MG/0.4ML ~~LOC~~ SOLN
40.0000 mg | SUBCUTANEOUS | Status: DC
  Administered 2013-04-29 – 2013-04-30 (×2): 40 mg via SUBCUTANEOUS
  Filled 2013-04-29 (×3): qty 0.4

## 2013-04-29 MED ORDER — FLUTICASONE PROPIONATE 50 MCG/ACT NA SUSP
2.0000 | Freq: Every day | NASAL | Status: DC
  Administered 2013-04-29 – 2013-04-30 (×2): 2 via NASAL
  Filled 2013-04-29: qty 16

## 2013-04-29 MED ORDER — CALCIUM CARBONATE-VITAMIN D 500-200 MG-UNIT PO TABS
1.0000 | ORAL_TABLET | Freq: Three times a day (TID) | ORAL | Status: DC
  Administered 2013-04-29 – 2013-05-01 (×6): 1 via ORAL
  Filled 2013-04-29 (×8): qty 1

## 2013-04-29 MED ORDER — ADULT MULTIVITAMIN W/MINERALS CH
1.0000 | ORAL_TABLET | Freq: Every day | ORAL | Status: DC
  Administered 2013-04-29 – 2013-05-01 (×3): 1 via ORAL
  Filled 2013-04-29 (×3): qty 1

## 2013-04-29 MED ORDER — ACETAMINOPHEN 650 MG RE SUPP: 650.0000 mg | RECTAL | Status: DC | PRN

## 2013-04-29 MED ORDER — DOXAZOSIN MESYLATE 8 MG PO TABS
8.0000 mg | ORAL_TABLET | Freq: Every morning | ORAL | Status: DC
  Administered 2013-04-29 – 2013-05-01 (×3): 8 mg via ORAL
  Filled 2013-04-29 (×4): qty 1

## 2013-04-29 MED ORDER — SODIUM CHLORIDE 0.9 % IV SOLN
INTRAVENOUS | Status: DC
  Administered 2013-04-29: 125 mL via INTRAVENOUS

## 2013-04-29 MED ORDER — ACETAMINOPHEN 325 MG PO TABS
650.0000 mg | ORAL_TABLET | Freq: Once | ORAL | Status: AC
  Administered 2013-04-29: 650 mg via ORAL
  Filled 2013-04-29: qty 2

## 2013-04-29 MED ORDER — TRAMADOL HCL 50 MG PO TABS
100.0000 mg | ORAL_TABLET | Freq: Two times a day (BID) | ORAL | Status: DC
  Administered 2013-04-29 – 2013-05-01 (×4): 100 mg via ORAL
  Filled 2013-04-29 (×4): qty 2

## 2013-04-29 MED ORDER — CIPROFLOXACIN IN D5W 400 MG/200ML IV SOLN
400.0000 mg | Freq: Once | INTRAVENOUS | Status: AC
  Administered 2013-04-29: 400 mg via INTRAVENOUS
  Filled 2013-04-29: qty 200

## 2013-04-29 MED ORDER — SENNOSIDES-DOCUSATE SODIUM 8.6-50 MG PO TABS: 2.0000 | ORAL_TABLET | Freq: Every evening | ORAL | Status: DC | PRN

## 2013-04-29 MED ORDER — PROMETHAZINE HCL 25 MG PO TABS: 12.5000 mg | ORAL_TABLET | Freq: Four times a day (QID) | ORAL | Status: DC | PRN

## 2013-04-29 MED ORDER — ETODOLAC 400 MG PO TABS
400.0000 mg | ORAL_TABLET | Freq: Two times a day (BID) | ORAL | Status: DC
  Filled 2013-04-29: qty 1

## 2013-04-29 MED ORDER — AZELASTINE HCL 0.1 % NA SOLN
1.0000 | Freq: Two times a day (BID) | NASAL | Status: DC
  Administered 2013-04-29 – 2013-04-30 (×2): 1 via NASAL
  Filled 2013-04-29: qty 30

## 2013-04-29 MED ORDER — PREGABALIN 75 MG PO CAPS
75.0000 mg | ORAL_CAPSULE | Freq: Two times a day (BID) | ORAL | Status: DC
  Administered 2013-04-29 – 2013-05-01 (×4): 75 mg via ORAL
  Filled 2013-04-29 (×4): qty 1

## 2013-04-29 MED ORDER — LORATADINE 10 MG PO TABS
10.0000 mg | ORAL_TABLET | Freq: Every day | ORAL | Status: DC
  Administered 2013-04-29 – 2013-05-01 (×3): 10 mg via ORAL
  Filled 2013-04-29 (×3): qty 1

## 2013-04-29 MED ORDER — ETODOLAC 200 MG PO CAPS
400.0000 mg | ORAL_CAPSULE | Freq: Two times a day (BID) | ORAL | Status: DC
  Administered 2013-04-29 – 2013-05-01 (×4): 400 mg via ORAL
  Filled 2013-04-29 (×5): qty 2

## 2013-04-29 MED ORDER — DOXYCYCLINE HYCLATE 100 MG PO TABS
100.0000 mg | ORAL_TABLET | Freq: Two times a day (BID) | ORAL | Status: DC
  Administered 2013-04-29 – 2013-05-01 (×4): 100 mg via ORAL
  Filled 2013-04-29 (×6): qty 1

## 2013-04-29 MED ORDER — BENZTROPINE MESYLATE 0.5 MG PO TABS
0.5000 mg | ORAL_TABLET | Freq: Two times a day (BID) | ORAL | Status: DC
  Administered 2013-04-29 – 2013-05-01 (×4): 0.5 mg via ORAL
  Filled 2013-04-29 (×5): qty 1

## 2013-04-29 MED ORDER — INSULIN ASPART 100 UNIT/ML ~~LOC~~ SOLN: 0.0000 [IU] | Freq: Three times a day (TID) | SUBCUTANEOUS | Status: DC

## 2013-04-29 MED ORDER — IPRATROPIUM BROMIDE 0.03 % NA SOLN
1.0000 | Freq: Three times a day (TID) | NASAL | Status: DC
  Administered 2013-04-29 – 2013-04-30 (×3): 1 via NASAL
  Filled 2013-04-29: qty 15

## 2013-04-29 MED ORDER — AMLODIPINE BESYLATE 5 MG PO TABS
5.0000 mg | ORAL_TABLET | Freq: Every morning | ORAL | Status: DC
  Administered 2013-04-29 – 2013-05-01 (×3): 5 mg via ORAL
  Filled 2013-04-29 (×4): qty 1

## 2013-04-29 MED ORDER — ASPIRIN EC 325 MG PO TBEC
325.0000 mg | DELAYED_RELEASE_TABLET | Freq: Two times a day (BID) | ORAL | Status: DC
  Administered 2013-04-29 – 2013-05-01 (×4): 325 mg via ORAL
  Filled 2013-04-29 (×6): qty 1

## 2013-04-29 MED ORDER — PANTOPRAZOLE SODIUM 40 MG PO TBEC
40.0000 mg | DELAYED_RELEASE_TABLET | Freq: Every day | ORAL | Status: DC
  Administered 2013-04-29 – 2013-05-01 (×3): 40 mg via ORAL
  Filled 2013-04-29 (×3): qty 1

## 2013-04-29 MED ORDER — SODIUM CHLORIDE 0.9 % IV SOLN: INTRAVENOUS | Status: DC

## 2013-04-29 MED ORDER — RIFAMPIN 300 MG PO CAPS
300.0000 mg | ORAL_CAPSULE | Freq: Two times a day (BID) | ORAL | Status: DC
  Administered 2013-04-29 – 2013-05-01 (×4): 300 mg via ORAL
  Filled 2013-04-29 (×6): qty 1

## 2013-04-29 MED ORDER — ACETAMINOPHEN 325 MG PO TABS
650.0000 mg | ORAL_TABLET | ORAL | Status: DC | PRN
  Administered 2013-04-29: 650 mg via ORAL
  Filled 2013-04-29: qty 2

## 2013-04-29 NOTE — Progress Notes (Signed)
ANTIBIOTIC CONSULT NOTE - INITIAL  Pharmacy Consult for Ciprofloxacin Indication: UTI  Allergies  Allergen Reactions  . Penicillins     "made me nervous"  . Thioridazine Hcl Itching    Patient Measurements: Height: 4' 10.25" (148 cm) Weight: 140 lb 4.8 oz (63.64 kg) IBW/kg (Calculated) : 41.48 Adjusted Body Weight:   Vital Signs: Temp: 97.5 F (36.4 C) (03/31 1525) Temp src: Oral (03/31 1525) BP: 135/96 mmHg (03/31 1525) Pulse Rate: 75 (03/31 1525) Intake/Output from previous day:   Intake/Output from this shift:    Labs:  Recent Labs  04/29/13 1014  WBC 9.6  HGB 14.1  PLT 373  CREATININE 0.80   Estimated Creatinine Clearance: 54.2 ml/min (by C-G formula based on Cr of 0.8). No results found for this basename: VANCOTROUGH, VANCOPEAK, VANCORANDOM, GENTTROUGH, GENTPEAK, GENTRANDOM, TOBRATROUGH, TOBRAPEAK, TOBRARND, AMIKACINPEAK, AMIKACINTROU, AMIKACIN,  in the last 72 hours   Microbiology: No results found for this or any previous visit (from the past 720 hour(s)).  Medical History: Past Medical History  Diagnosis Date  . ABDOMINAL PAIN, LOWER 02/04/2010  . ABNORMAL THYROID FUNCTION TESTS 08/04/2009  . ALLERGIC RHINITIS 09/05/2006  . ANXIETY 07/31/2007  . DEPRESSION 09/05/2006  . DIVERTICULOSIS, COLON 09/05/2006  . Dysuria 04/15/2010  . Failure to thrive in childhood 04/06/2010  . FATIGUE 04/15/2010  . FIBROMYALGIA 05/10/2009  . FRACTURE, PELVIS, RIGHT 04/15/2010  . GASTROENTERITIS, ACUTE 12/25/2008  . GERD 09/05/2006  . HYPERLIPIDEMIA 09/05/2006  . HYPERTENSION 09/05/2006  . KNEE PAIN, LEFT 02/04/2010  . OSTEOARTHRITIS 09/05/2006  . OSTEOPOROSIS 07/31/2007  . Other specified forms of hearing loss 11/01/2009  . SINUSITIS- ACUTE-NOS 10/09/2007  . SKIN LESION 05/10/2009  . SPRAIN&STRAIN OTH SPEC SITES SHOULDER&UPPER ARM 11/01/2009  . Unspecified hearing loss 10/09/2007  . URI 02/20/2007  . UTI 02/20/2007  . Blood transfusion   . Cataract     resolved -surgery done  . Heart murmur    . 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  Pt also with symptoms concerning for stroke or TIA.   Pharmacy consulted to dose ciprofloxacin for possible UTI.  1st dose already given in ED.   Noted allergy to penicillins (rxn: nervousness).   PTA medications include doxycycline and rifampin BID for MRSA PJI.    Outpatient doxycycline and rifampin >> 3/31 >> Ciprofloxcin  >>  Tmax: 97.5 WBCs: 9.6 Renal: SCr 0.80, CrCl 54  3/31: U/A: few bacteria, negative nitrite, moderate leukocytes No urine culture ordered yet  Goal of Therapy:  Eradication of infection  Plan:  Ciprofloxacin 400mg  IV q12h F/u microbiology  Haynes Hoehnolleen Trinidi Toppins, PharmD, BCPS 04/29/2013, 4:45 PM  Pager: [phone]

## 2013-04-29 NOTE — Progress Notes (Signed)
   CARE MANAGEMENT ED NOTE 04/29/2013  Patient:  KatherineShireen [name]   Account Number:  [id number]  Date Initiated:  04/29/2013  Documentation initiated by:  Edd ArbourGIBBS,Lynda Wanninger  Subjective/Objective Assessment:   68 yr old medicare pt brought in by best friend when noted with confusion, facial droop Pt noted to be not [name] good historian in ED.     Subjective/Objective Assessment Detail:   CM consult Please follow pt for possible home needs or possible referral to case manager for placement. Pt lives alone and here with confusion. [name] friend with pt at time of admission hx and requests that her name and number be removed from emergency contact    prevously in snf 12/2012 [facility] [place]     Action/Plan:   Cm unable to assess pt in ED   Action/Plan Detail:   Cm left medical director [name] voice message at 1503 Reviewing case   Anticipated DC Date:       Status Recommendation to Physician:   Result of Recommendation:    Other ED Services  Consult Working Plan    DC Planning Services  Other  PCP issues    Choice offered to / List presented to:            Status of service:  Completed, signed off  ED Comments:   ED Comments Detail:

## 2013-04-29 NOTE — H&P (Signed)
[facility] History and Physical  JERZEE JEROME ZOX:[id number] DOB: Jul 01, 1945 DOA: 04/29/2013  Referring physician:  Raymon Mutton PCP:  Oliver Barre, MD   Chief Complaint:  AMS, headache, abdominal pain  HPI:  The patient is a 68 y.o. year-old female with history of diabetes mellitus, high blood pressure, high cholesterol, arthritis, fibromyalgia who presents with AMS, headache, abdominal pain.  The patient was last at their baseline health a day ago.    Recently she has been restless, getting up and walking around more than usual. Today, a family friend came to pick her, and her thinking was very busy. She did not remember to grab her keys, wallet, her phone when they were leaving the house. She had a hard time remembering her name, date of birth, or answering questions initially.  Per family friend, she had a some mild right facial droop, some neglect of her right hand. She was not sure she had any changes to her gait. The patient states that she does not really feel any weakness, numbness, or changes to her thinking. Since being in the emergency department, she is return tear more normal self. Her speech has improved somewhat. Her thinking is still a little disorganized, and she is only able to say her head and left abdomen hurt.    In the emergency department, with vital signs are notable for temperature of 99.2 pulse in the 110s, breathing comfortably on room air, blood pressure is highest 181/94. Labs were notable for potassium 3.5, glucose 219. Head CT was negative for acute changes. Chest x-ray demonstrated hypoventilation with lower lobe atelectasis on the left. Urinalysis was suggestive of urinary tract infection with moderate leukocyte esterase, 11-20 white blood cells, few bacteria. She seems dehydrated, based on blood work and specific gravity of urinalysis was 1.0-1. Lactic acid was 2.02.  Review of Systems:  Limited by confusion/AMS.  Assisted by family friend  General:   Denies fevers, chills , HA + HEENT:  Denies sinus congestion, sore throat CV:  Denies chest pain PULM:  Denies SOB GI:  Denies nausea, vomiting, constipation, diarrhea.  + abdominal pain GU:  Denies dysuria MSK:  Denies worsening left knee pain.  [name]   DERM:  Denies skin rash or ulcer.   NEURO:  Per hPI  Past Medical History  Diagnosis Date  . ABDOMINAL PAIN, LOWER 02/04/2010  . ABNORMAL THYROID FUNCTION TESTS 08/04/2009  . ALLERGIC RHINITIS 09/05/2006  . ANXIETY 07/31/2007  . DEPRESSION 09/05/2006  . DIVERTICULOSIS, COLON 09/05/2006  . Dysuria 04/15/2010  . Failure to thrive in childhood 04/06/2010  . FATIGUE 04/15/2010  . FIBROMYALGIA 05/10/2009  . FRACTURE, PELVIS, RIGHT 04/15/2010  . GASTROENTERITIS, ACUTE 12/25/2008  . GERD 09/05/2006  . HYPERLIPIDEMIA 09/05/2006  . HYPERTENSION 09/05/2006  . KNEE PAIN, LEFT 02/04/2010  . OSTEOARTHRITIS 09/05/2006  . OSTEOPOROSIS 07/31/2007  . Other specified forms of hearing loss 11/01/2009  . SINUSITIS- ACUTE-NOS 10/09/2007  . SKIN LESION 05/10/2009  . SPRAIN&STRAIN OTH SPEC SITES SHOULDER&UPPER ARM 11/01/2009  . Unspecified hearing loss 10/09/2007  . URI 02/20/2007  . UTI 02/20/2007  . Blood transfusion   . Cataract     resolved -surgery done  . Heart murmur   . DIABETES MELLITUS, TYPE II 07/31/2007    no meds   Past Surgical History  Procedure Laterality Date  . Tonsillectomy    . Breast biopsy    . Abdominal hysterectomy  12/08    Bladder tac, partial hysterectomy  . Inguinal herniorrhapy  right Dr. Freida Busman 06/2009  . Catarct removal      both eyes  . Total knee arthroplasty Left 12/20/2012    Procedure: LEFT TOTAL KNEE ARTHROPLASTY;  Surgeon: Kathryne Hitch, MD;  Location: [facility];  Service: Orthopedics;  Laterality: Left;  . I&d knee with poly exchange Left 01/03/2013    Procedure: IRRIGATION AND DEBRIDEMENT LEFT KNEE WITH POLY EXCHANGE;  Surgeon: Kathryne Hitch, MD;  Location: [facility];  Service: Orthopedics;  Laterality: Left;   Social  History:  reports that she has never smoked. She does not have any smokeless tobacco history on file. She reports that she does not drink alcohol or use illicit drugs. LIves alone with home health services.  Allergies  Allergen Reactions  . Penicillins     "made me nervous"  . Thioridazine Hcl Itching    Family History  Problem Relation Age of Onset  . Coronary artery disease Other   . Diabetes Other   . Colon cancer Neg Hx   . Esophageal cancer Neg Hx   . Stomach cancer Neg Hx   . Rectal cancer Neg Hx      Prior to Admission medications   Medication Sig Start Date End Date Taking? Authorizing Provider  albuterol (PROVENTIL HFA;VENTOLIN HFA) 108 (90 BASE) MCG/ACT inhaler Inhale 2 puffs into the lungs every 6 (six) hours as needed for wheezing or shortness of breath.   Yes Historical Provider, MD  amLODipine (NORVASC) 5 MG tablet Take 5 mg by mouth every morning.   Yes Historical Provider, MD  aspirin EC 325 MG EC tablet Take 1 tablet (325 mg total) by mouth 2 (two) times daily after a meal. 12/23/12  Yes Richardean Canal, PA-C  atorvastatin (LIPITOR) 40 MG tablet Take 40 mg by mouth every morning.   Yes Historical Provider, MD  azelastine (ASTELIN) 137 MCG/SPRAY nasal spray Place 1 spray into both nostrils 2 (two) times daily. Use in each nostril as directed   Yes Historical Provider, MD  benztropine (COGENTIN) 0.5 MG tablet Take 0.5 mg by mouth 2 (two) times daily.     Yes Historical Provider, MD  calcium-vitamin D (OSCAL WITH D) 500-200 MG-UNIT per tablet Take 1 tablet by mouth 3 (three) times daily.   Yes Historical Provider, MD  cetirizine (ZYRTEC) 10 MG tablet Take 10 mg by mouth daily.   Yes Historical Provider, MD  doxazosin (CARDURA) 8 MG tablet Take 8 mg by mouth every morning.   Yes Historical Provider, MD  doxycycline (VIBRA-TABS) 100 MG tablet Take 1 tablet (100 mg total) by mouth 2 (two) times daily. 04/14/13  Yes Randall Hiss, MD  etodolac (LODINE) 400 MG tablet Take  400 mg by mouth 2 (two) times daily.   Yes Historical Provider, MD  fluticasone (FLONASE) 50 MCG/ACT nasal spray Place 2 sprays into both nostrils daily.   Yes Historical Provider, MD  ipratropium (ATROVENT) 0.06 % nasal spray Place 1 spray into both nostrils 3 (three) times daily.   Yes Historical Provider, MD  mometasone (NASONEX) 50 MCG/ACT nasal spray Place 2 sprays into the nose daily.   Yes Historical Provider, MD  Multiple Vitamin (MULTIVITAMIN WITH MINERALS) TABS tablet Take 1 tablet by mouth daily.   Yes Historical Provider, MD  omeprazole (PRILOSEC) 20 MG capsule Take 20 mg by mouth daily.   Yes Historical Provider, MD  pregabalin (LYRICA) 75 MG capsule Take 1 capsule (75 mg total) by mouth 2 (two) times daily. 01/09/13  Yes Claudie Revering,  NP  promethazine (PHENERGAN) 12.5 MG tablet Take 1 tablet (12.5 mg total) by mouth every 6 (six) hours as needed for nausea or vomiting. 03/19/13  Yes Corwin Levins, MD  rifampin (RIFADIN) 300 MG capsule Take 1 capsule (300 mg total) by mouth every 12 (twelve) hours. 04/14/13  Yes Randall Hiss, MD  risperiDONE (RISPERDAL) 3 MG tablet Take 3 mg by mouth at bedtime.    Yes Historical Provider, MD  tiZANidine (ZANAFLEX) 4 MG tablet Take 1 tablet (4 mg total) by mouth every 6 (six) hours as needed for muscle spasms. 04/02/13  Yes Corwin Levins, MD  traMADol (ULTRAM-ER) 200 MG 24 hr tablet Take 200 mg by mouth daily as needed for pain.   Yes Historical Provider, MD   Physical Exam: Filed Vitals:   04/29/13 1131 04/29/13 1210 04/29/13 1231 04/29/13 1318  BP:  181/94 177/95 149/87  Pulse:  112  115  Temp: 99.2 F (37.3 C)     TempSrc: Rectal     Resp:  19 20 18   SpO2:  99%  98%     General:  CF, NAD  Eyes:  PERRL, anicteric, non-injected.  ENT:  Nares clear.  OP clear, non-erythematous without plaques or exudates.  MMM.  Neck:  Supple without TM or JVD.    Lymph:  No cervical, supraclavicular, or submandibular LAD.  Cardiovascular:  RRR,  normal S1, S2, 2/6 systolic murmur LSB.  2+ pulses, warm extremities  Respiratory:  CTA bilaterally without increased WOB.  Abdomen:  NABS.  Soft, ND, mild TTP Left lower quadrant without rebound or guarding  Skin:  Pink scar over left knee without surrounding erythema or induration.  nontender to palpation  Musculoskeletal:  Normal bulk and tone.  No LE edema.  Psychiatric:  A & O to person, hospital, but not time or reason for coming to hospital.  Unusually happy affect per family friend  Neurologic:  Possible mild right facial droop.  5/5 strength bilaterally throughout.  Sensation intact.  No obvious dysmetria  Labs on Admission:  Basic Metabolic Panel:  Recent Labs Lab 04/29/13 1014  NA 144  K 3.5*  CL 101  CO2 27  GLUCOSE 219*  BUN 19  CREATININE 0.80  CALCIUM 9.8   Liver Function Tests:  Recent Labs Lab 04/29/13 1014  AST 62*  ALT 55*  ALKPHOS 88  BILITOT 0.4  PROT 7.9  ALBUMIN 4.0    Recent Labs Lab 04/29/13 1015  LIPASE 10*   No results found for this basename: AMMONIA,  in the last 168 hours CBC:  Recent Labs Lab 04/29/13 1014  WBC 9.6  NEUTROABS 7.5  HGB 14.1  HCT 42.8  MCV 87.0  PLT 373   Cardiac Enzymes:  Recent Labs Lab 04/29/13 1014 04/29/13 1350  TROPONINI <0.30 <0.30    BNP (last 3 results) No results found for this basename: PROBNP,  in the last 8760 hours CBG: No results found for this basename: GLUCAP,  in the last 168 hours  Radiological Exams on Admission: Ct Head Wo Contrast  04/29/2013   CLINICAL DATA:  Altered mental status. Diabetic hypertensive hyperlipidemia patient.  EXAM: CT HEAD WITHOUT CONTRAST  TECHNIQUE: Contiguous axial images were obtained from the base of the skull through the vertex without intravenous contrast.  COMPARISON:  No comparison head CT.  Sinus CT 09/11/2003.  FINDINGS: No intracranial hemorrhage.  Peri vascular space versus prior small lacune infarct left lenticular nucleus. No CT evidence of  large acute  infarct.  No intracranial mass lesion noted on this unenhanced exam.  No hydrocephalus.  Orbital structures unremarkable.  IMPRESSION: No intracranial hemorrhage or CT evidence of large acute infarct.  Please see above.   Electronically Signed   By: Bridgett LarssonSteve  Olson M.D.   On: 04/29/2013 11:41   Dg Chest Port 1 View  04/29/2013   CLINICAL DATA:  Altered mental status  EXAM: PORTABLE CHEST - 1 VIEW  COMPARISON:  12/17/2012  FINDINGS: Decreased lung volume compared with the prior study. Mild left lower lobe atelectasis. Negative for heart failure or pneumonia or effusion.  IMPRESSION: Hypoventilation with left lower lobe atelectasis.   Electronically Signed   By: Marlan Palauharles  Clark M.D.   On: 04/29/2013 11:19    EKG: Independently reviewed.  Sinus tachycardia with some new ST segment depressions in I, aVL, otherwise stable from prior  Assessment/Plan Active Problems:   DIABETES MELLITUS, TYPE II   HYPERLIPIDEMIA   ANXIETY   HYPERTENSION   GERD   FIBROMYALGIA   Infection of left total knee replacement   Altered mental status   Facial droop  ---  Confusion/metabolic encephalopathy due to urinary tract infection, however the reported right facial droop and right upper extremity weakness or concerning for TIA or stroke. She may also have lingering effects of her multiple sedating medications.   -  Followup urine culture -  Continue ciprofloxacin -  MRI brain -  Telemetry -  If MRI brain positive for stroke, consider further evaluation with carotid duplex, echocardiogram, MRA brain  Dehydration, continue IV fluids.   EKG changes may have been due to tachycardia or stroke. -  Repeat EKG was tachycardia has resolved -  Telemetry -  Cycle troponins  Type 2 diabetes mellitus, blood sugar mildly elevated -  Hemoglobin A1c -  Sliding scale insulin -  Diabetic diet if passes swallow screen  Hypertension/hyperlipidemia, blood pressure is elevated -  Continue norvasc, doxazosin  Not on  diuretics  Depression/anxiety -  Continue risperidone  Hypokalemia, likely due to poor oral intake recently. 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  Continue nasal sprays and loratadine  Diet:  NPO pending swallow eval, then diabetic diet Access:  PIV IVF:  yes Proph:  lovenox  Code Status: full Family Communication: patient and her friend  Disposition Plan: Admit to telemetry  Time spent: 60 min Renae FickleSHORT, Tasia Liz [facility] Pager [phone]  If 7PM-7AM, please contact night-coverage [email or web address] Password [email or web address] 04/29/2013, 2:38 PM

## 2013-04-29 NOTE — ED Notes (Addendum)
Pt brought in by best friend with complaint of confusion; best friend left immediately after dropping off patient so no history obtained; pt calm and pleasant; ambulating without difficulty; states just doesn't feel good; oriented to person/birthday/place/states she doesn't know what she did this morning; repeats birthday for date; pt states feels nauseated; states chest is hurting "not long"; denies dysuria or back pain;

## 2013-04-29 NOTE — ED Provider Notes (Addendum)
CSN: [id number]     Arrival date & time 04/29/13  4098 History   First MD Initiated Contact with Patient 04/29/13 1026     Chief Complaint  Patient presents with  . Altered Mental Status  . Generalized Body Aches     (Consider location/radiation/quality/duration/timing/severity/associated sxs/prior Treatment) The history is provided by the patient and a friend. No language interpreter was used.  Katherine [name] is a 68 y/o F with PMHx of diabetes, heart murmur, hypertension, hyperlipidemia, GERD, fibromyalgia, depression, anxiety, abnormal thyroid functioning presenting to the ED with altered mental status as per friend who accompanies patient. Friend reported that they were supposed to go to [place] for her birthday, but reported that when her son went to pick up the patient the patient was found to be confused. Reported that patient kept repeating herself, did not know where her keys are her bag where which is not normal for her. Friend reported that she kept asking who she is and patient is unable to identify a friend. Friend reported that patient was unable to identify the date and where she was is not normal for her. Friend stated that home health aide noticed that patient was acting abnormally yesterday, reported that she was not feeling well and kept trying to get out of bed.  Level 5 Caveat  Past Medical History  Diagnosis Date  . ABDOMINAL PAIN, LOWER 02/04/2010  . ABNORMAL THYROID FUNCTION TESTS 08/04/2009  . ALLERGIC RHINITIS 09/05/2006  . ANXIETY 07/31/2007  . DEPRESSION 09/05/2006  . DIVERTICULOSIS, COLON 09/05/2006  . Dysuria 04/15/2010  . Failure to thrive in childhood 04/06/2010  . FATIGUE 04/15/2010  . FIBROMYALGIA 05/10/2009  . FRACTURE, PELVIS, RIGHT 04/15/2010  . GASTROENTERITIS, ACUTE 12/25/2008  . GERD 09/05/2006  . HYPERLIPIDEMIA 09/05/2006  . HYPERTENSION 09/05/2006  . KNEE PAIN, LEFT 02/04/2010  . OSTEOARTHRITIS 09/05/2006  . OSTEOPOROSIS 07/31/2007  . Other specified forms of hearing  loss 11/01/2009  . SINUSITIS- ACUTE-NOS 10/09/2007  . SKIN LESION 05/10/2009  . SPRAIN&STRAIN OTH SPEC SITES SHOULDER&UPPER ARM 11/01/2009  . Unspecified hearing loss 10/09/2007  . URI 02/20/2007  . UTI 02/20/2007  . Blood transfusion   . Cataract     resolved -surgery done  . Heart murmur   . DIABETES MELLITUS, TYPE II 07/31/2007    no meds   Past Surgical History  Procedure Laterality Date  . Tonsillectomy    . Breast biopsy    . Abdominal hysterectomy  12/08    Bladder tac, partial hysterectomy  . Inguinal herniorrhapy      right Dr. Freida Busman 06/2009  . Catarct removal      both eyes  . Total knee arthroplasty Left 12/20/2012    Procedure: LEFT TOTAL KNEE ARTHROPLASTY;  Surgeon: Kathryne Hitch, MD;  Location: [facility];  Service: Orthopedics;  Laterality: Left;  . I&d knee with poly exchange Left 01/03/2013    Procedure: IRRIGATION AND DEBRIDEMENT LEFT KNEE WITH POLY EXCHANGE;  Surgeon: Kathryne Hitch, MD;  Location: [facility];  Service: Orthopedics;  Laterality: Left;   Family History  Problem Relation Age of Onset  . Coronary artery disease Other   . Diabetes Other   . Colon cancer Neg Hx   . Esophageal cancer Neg Hx   . Stomach cancer Neg Hx   . Rectal cancer Neg Hx    History  Substance Use Topics  . Smoking status: Never Smoker   . Smokeless tobacco: Not on file  . Alcohol Use: No  OB History   Grav Para Term Preterm Abortions TAB SAB Ect Mult Living                 Review of Systems  Unable to perform ROS: Mental status change      Allergies  Penicillins and Thioridazine hcl  Home Medications   No current outpatient prescriptions on file. BP 185/88  Pulse 80  Temp(Src) 98.9 F (37.2 C) (Oral)  Resp 18  Ht 4' 10.25" (1.48 m)  Wt 140 lb 4.8 oz (63.64 kg)  BMI 29.05 kg/m2  SpO2 100% Physical Exam  Nursing note and vitals reviewed. Constitutional: She appears well-developed and well-nourished. No distress.  HENT:  Head: Normocephalic and  atraumatic.  Mouth/Throat: Oropharynx is clear and moist. No oropharyngeal exudate.  Eyes: Conjunctivae and EOM are normal. Pupils are equal, round, and reactive to light. Right eye exhibits no discharge. Left eye exhibits no discharge.  Negative nystagmus Visual fields grossly intact  Neck: Normal range of motion. Neck supple. No tracheal deviation present.  Negative nuchal rigidity Negative cervical lymphadenopathy Negative meningeal signs  Cardiovascular: Normal rate, regular rhythm and normal heart sounds.  Exam reveals no friction rub.   No murmur heard. Pulses:      Radial pulses are 2+ on the right side, and 2+ on the left side.       Dorsalis pedis pulses are 2+ on the right side, and 2+ on the left side.  Cap refill less than 3 seconds Negative swelling or pitting edema identified to lower extremities bilaterally  Pulmonary/Chest: Effort normal and breath sounds normal. No respiratory distress. She has no wheezes. She has no rales.  Abdominal: Soft. Bowel sounds are normal. She exhibits no distension. There is no tenderness. There is no guarding.  Musculoskeletal: Normal range of motion.  Full ROM to upper and lower extremities without difficulty noted, negative ataxia noted.  Lymphadenopathy:    She has no cervical adenopathy.  Neurological: She is alert. No cranial nerve deficit. She exhibits normal muscle tone. Coordination normal.  Cranial nerves III-XII grossly intact Strength 5+/5+ to upper and lower extremities bilaterally with resistance applied, equal distribution noted Sensation intact Patient follows commands well.  Patient able to bring finger to nose bilaterally without difficulty or ataxia Equal grip strength When asked what day it is patient continues to repeat that it's "Faye's birthday" When asked to bring heel to knee patient continues his cross her legs at her ankles Slurred speech noted, but appears to be a chronic issue according to friend's report  Skin:  Skin is warm and dry. No rash noted. She is not diaphoretic. No erythema.  Psychiatric: She has a normal mood and affect. Her behavior is normal. Thought content normal.    ED Course  Procedures (including critical care time)  11:11 AM Patient seen and assessed by attending physician.   1:30 PM This provider spoke with Dr. Malachi Bonds - discussed case, history, presentation, labs, imaging n great detail. Patient to be admitted to the hospital for Telemetry for AMS.   Results for orders placed during the hospital encounter of 04/29/13  CBC WITH DIFFERENTIAL      Result Value Ref Range   WBC 9.6  4.0 - 10.5 K/uL   RBC 4.92  3.87 - 5.11 MIL/uL   Hemoglobin 14.1  12.0 - 15.0 g/dL   HCT 16.1  09.6 - 04.5 %   MCV 87.0  78.0 - 100.0 fL   MCH 28.7  26.0 - 34.0 pg  MCHC 32.9  30.0 - 36.0 g/dL   RDW 16.1  09.6 - 04.5 %   Platelets 373  150 - 400 K/uL   Neutrophils Relative % 79 (*) 43 - 77 %   Neutro Abs 7.5  1.7 - 7.7 K/uL   Lymphocytes Relative 16  12 - 46 %   Lymphs Abs 1.5  0.7 - 4.0 K/uL   Monocytes Relative 5  3 - 12 %   Monocytes Absolute 0.5  0.1 - 1.0 K/uL   Eosinophils Relative 0  0 - 5 %   Eosinophils Absolute 0.0  0.0 - 0.7 K/uL   Basophils Relative 0  0 - 1 %   Basophils Absolute 0.0  0.0 - 0.1 K/uL  COMPREHENSIVE METABOLIC PANEL      Result Value Ref Range   Sodium 144  137 - 147 mEq/L   Potassium 3.5 (*) 3.7 - 5.3 mEq/L   Chloride 101  96 - 112 mEq/L   CO2 27  19 - 32 mEq/L   Glucose, Bld 219 (*) 70 - 99 mg/dL   BUN 19  6 - 23 mg/dL   Creatinine, Ser 4.09  0.50 - 1.10 mg/dL   Calcium 9.8  8.4 - 81.1 mg/dL   Total Protein 7.9  6.0 - 8.3 g/dL   Albumin 4.0  3.5 - 5.2 g/dL   AST 62 (*) 0 - 37 U/L   ALT 55 (*) 0 - 35 U/L   Alkaline Phosphatase 88  39 - 117 U/L   Total Bilirubin 0.4  0.3 - 1.2 mg/dL   GFR calc non Af Amer 75 (*) >90 mL/min   GFR calc Af Amer 86 (*) >90 mL/min  URINALYSIS, ROUTINE W REFLEX MICROSCOPIC      Result Value Ref Range   Color, Urine AMBER (*)  YELLOW   APPearance CLOUDY (*) CLEAR   Specific Gravity, Urine 1.021  1.005 - 1.030   pH 6.0  5.0 - 8.0   Glucose, UA NEGATIVE  NEGATIVE mg/dL   Hgb urine dipstick NEGATIVE  NEGATIVE   Bilirubin Urine MODERATE (*) NEGATIVE   Ketones, ur 15 (*) NEGATIVE mg/dL   Protein, ur 30 (*) NEGATIVE mg/dL   Urobilinogen, UA 0.2  0.0 - 1.0 mg/dL   Nitrite NEGATIVE  NEGATIVE   Leukocytes, UA MODERATE (*) NEGATIVE  TROPONIN I      Result Value Ref Range   Troponin I <0.30  <0.30 ng/mL  URINE MICROSCOPIC-ADD ON      Result Value Ref Range   Squamous Epithelial / LPF RARE  RARE   WBC, UA 11-20  <3 WBC/hpf   RBC / HPF 0-2  <3 RBC/hpf   Bacteria, UA FEW (*) RARE   Casts HYALINE CASTS (*) NEGATIVE   Urine-Other MUCOUS PRESENT    LIPASE, BLOOD      Result Value Ref Range   Lipase 10 (*) 11 - 59 U/L  TROPONIN I      Result Value Ref Range   Troponin I <0.30  <0.30 ng/mL  TROPONIN I      Result Value Ref Range   Troponin I <0.30  <0.30 ng/mL  I-STAT TROPOININ, ED      Result Value Ref Range   Troponin i, poc 0.02  0.00 - 0.08 ng/mL   Comment 3           I-STAT CG4 LACTIC ACID, ED      Result Value Ref Range   Lactic Acid, Venous 2.02  0.5 - 2.2 mmol/L    Labs Review Labs Reviewed  CBC WITH DIFFERENTIAL - Abnormal; Notable for the following:    Neutrophils Relative % 79 (*)    All other components within normal limits  COMPREHENSIVE METABOLIC PANEL - Abnormal; Notable for the following:    Potassium 3.5 (*)    Glucose, Bld 219 (*)    AST 62 (*)    ALT 55 (*)    GFR calc non Af Amer 75 (*)    GFR calc Af Amer 86 (*)    All other components within normal limits  URINALYSIS, ROUTINE W REFLEX MICROSCOPIC - Abnormal; Notable for the following:    Color, Urine AMBER (*)    APPearance CLOUDY (*)    Bilirubin Urine MODERATE (*)    Ketones, ur 15 (*)    Protein, ur 30 (*)    Leukocytes, UA MODERATE (*)    All other components within normal limits  URINE MICROSCOPIC-ADD ON - Abnormal;  Notable for the following:    Bacteria, UA FEW (*)    Casts HYALINE CASTS (*)    All other components within normal limits  LIPASE, BLOOD - Abnormal; Notable for the following:    Lipase 10 (*)    All other components within normal limits  URINE CULTURE  TROPONIN I  TROPONIN I  TROPONIN I  TROPONIN I  TROPONIN I  HEMOGLOBIN A1C  LIPID PANEL  BASIC METABOLIC PANEL  CBC  I-STAT TROPOININ, ED  I-STAT CG4 LACTIC ACID, ED   Imaging Review Ct Head Wo Contrast  04/29/2013   CLINICAL DATA:  Altered mental status. Diabetic hypertensive hyperlipidemia patient.  EXAM: CT HEAD WITHOUT CONTRAST  TECHNIQUE: Contiguous axial images were obtained from the base of the skull through the vertex without intravenous contrast.  COMPARISON:  No comparison head CT.  Sinus CT 09/11/2003.  FINDINGS: No intracranial hemorrhage.  Peri vascular space versus prior small lacune infarct left lenticular nucleus. No CT evidence of large acute infarct.  No intracranial mass lesion noted on this unenhanced exam.  No hydrocephalus.  Orbital structures unremarkable.  IMPRESSION: No intracranial hemorrhage or CT evidence of large acute infarct.  Please see above.   Electronically Signed   By: Bridgett LarssonSteve  Olson M.D.   On: 04/29/2013 11:41   Dg Chest Port 1 View  04/29/2013   CLINICAL DATA:  Altered mental status  EXAM: PORTABLE CHEST - 1 VIEW  COMPARISON:  12/17/2012  FINDINGS: Decreased lung volume compared with the prior study. Mild left lower lobe atelectasis. Negative for heart failure or pneumonia or effusion.  IMPRESSION: Hypoventilation with left lower lobe atelectasis.   Electronically Signed   By: Marlan Palauharles  Clark M.D.   On: 04/29/2013 11:19     EKG Interpretation   Date/Time:  Tuesday April 29 2013 10:02:02 EDT Ventricular Rate:  115 PR Interval:  128 QRS Duration: 90 QT Interval:  329 QTC Calculation: 455 R Axis:   -50 Text Interpretation:  Sinus tachycardia Ventricular premature complex Left  anterior fascicular  block minimal ST depression I, aVL, II, V2, V4  Borderline repolarization abnormality Confirmed by DOCHERTY  MD, MEGAN  ([id number]) on 04/29/2013 1:22:04 PM      MDM   Final diagnoses:  Altered mental status  HTN (hypertension)  DM (diabetes mellitus)  UTI (urinary tract infection)   Medications  0.9 %  sodium chloride infusion (not administered)  doxycycline (VIBRA-TABS) tablet 100 mg (not administered)  rifampin (RIFADIN) capsule 300 mg (not administered)  promethazine (  PHENERGAN) tablet 12.5 mg (not administered)  calcium-vitamin D (OSCAL WITH D) 500-200 MG-UNIT per tablet 1 tablet (1 tablet Oral Given 04/29/13 1633)  pregabalin (LYRICA) capsule 75 mg (not administered)  amLODipine (NORVASC) tablet 5 mg (5 mg Oral Given 04/29/13 1633)  atorvastatin (LIPITOR) tablet 40 mg (40 mg Oral Given 04/29/13 1633)  azelastine (ASTELIN) nasal spray 1 spray (not administered)  loratadine (CLARITIN) tablet 10 mg (10 mg Oral Given 04/29/13 1633)  doxazosin (CARDURA) tablet 8 mg (8 mg Oral Given 04/29/13 1633)  ipratropium (ATROVENT) 0.03 % nasal spray 1 spray (not administered)  fluticasone (FLONASE) 50 MCG/ACT nasal spray 2 spray (2 sprays Each Nare Given 04/29/13 1634)  multivitamin with minerals tablet 1 tablet (1 tablet Oral Given 04/29/13 1633)  pantoprazole (PROTONIX) EC tablet 40 mg (not administered)  aspirin EC tablet 325 mg (not administered)  benztropine (COGENTIN) tablet 0.5 mg (not administered)  risperiDONE (RISPERDAL) tablet 3 mg (not administered)  senna-docusate (Senokot-S) tablet 2 tablet (not administered)  enoxaparin (LOVENOX) injection 40 mg (not administered)  acetaminophen (TYLENOL) tablet 650 mg (not administered)    Or  acetaminophen (TYLENOL) suppository 650 mg (not administered)  insulin aspart (novoLOG) injection 0-9 Units (0 Units Subcutaneous Not Given 04/29/13 1700)  traMADol (ULTRAM) tablet 100 mg (not administered)  etodolac (LODINE) capsule 400 mg (not administered)   ciprofloxacin (CIPRO) IVPB 400 mg (not administered)  ciprofloxacin (CIPRO) IVPB 400 mg (400 mg Intravenous New Bag/Given 04/29/13 1348)  acetaminophen (TYLENOL) tablet 650 mg (650 mg Oral Given 04/29/13 1348)   Filed Vitals:   04/29/13 1231 04/29/13 1318 04/29/13 1525 04/29/13 1725  BP: 177/95 149/87 135/96 185/88  Pulse:  115 75 80  Temp:   97.5 F (36.4 C) 98.9 F (37.2 C)  TempSrc:   Oral Oral  Resp: 20 18 20 18   Height:   4' 10.25" (1.48 m)   Weight:   140 lb 4.8 oz (63.64 kg)   SpO2:  98% 98% 100%    Patient presenting to the ED with altered mental status - last seen normal on Sunday, as per friend's report. Alert. Disoriented x3. When asked what day it is patient continues to repeat "Faye's birthday." Follows commands well. Heart rate and rhythm normal. Lungs clear to auscultation to upper and lower lobes bilaterally. Radial and DP pulses 2+. Cap refill less than 3 seconds. Negative abdominal distention identified. Bowel sounds normoactive in all 4 quadrants. Soft upon palpation with benign abdominal exam. Cranial nerves grossly intact. Negative nystagmus, visual fields grossly intact. Full range of motion to upper and lower tremors identified without difficulty or ataxia noted. Patient is able to bring finger to nose bilaterally without difficulty. Strength equal distribution. Sensation intact. When asked to bring heel to deep down shin patient continues to cross her legs at the ankles even when shown by this provider. EKG noted sinus tachycardia with a heart rate of 115 bpm - PVCs noted with minimal ST depressions in I, aVL, II, V2, V4. Troponin negative elevation. CBC negative elevation white blood cell count identified--negative left shift or leukocytosis. CMP noted normally functioning kidneys. AST mildly elevated at 62, ALT mild elevated at 55. Lactic acid negative elevation and 2.02. Urinalysis noted moderate bilirubin with moderate leukocytes and elevated white blood cell count of  11-20. Chest x-ray noted hypoventilation with left lower lobe atelectasis. CT head negative for acute injury, ICH, or acute infarct.  Suspicion of AMS to be due to UTI - cannot rule out possible stroke, time of when  last seen normal is Sunday. MRI to be performed while in hospital. Patient given IV fluids while in the ED setting. Started on IV antibiotics. Patient stable, afebrile. Patient does not appear septic. Patient admitted to the hospital. Discussed plan of admission with friend - agreed. Patient stable for transfer.   Raymon Mutton, PA-C 04/29/13 1751  Raymon Mutton, PA-C 04/29/13 1752  Raymon Mutton, PA-C 05/01/13 [phone]

## 2013-04-29 NOTE — Progress Notes (Signed)
UR completed 

## 2013-04-29 NOTE — ED Notes (Signed)
Best friend back to ER; states pt couldn't find her keys or find her telephone; states always brings her pocketbook but didn't today; per caregiver pt kept getting out of car and walking around to the other side x 5; couldn't remember how to put on her seatbelt; states normally pt calls her every day but she didn't call her yesterday; states normally pt is very attentive and lives alone and cares for herself;

## 2013-04-30 ENCOUNTER — Inpatient Hospital Stay (HOSPITAL_COMMUNITY): Payer: PRIVATE HEALTH INSURANCE

## 2013-04-30 LAB — BASIC METABOLIC PANEL
BUN: 12 mg/dL (ref 6–23)
CO2: 26 meq/L (ref 19–32)
CREATININE: 0.53 mg/dL (ref 0.50–1.10)
Calcium: 8.8 mg/dL (ref 8.4–10.5)
Chloride: 107 mEq/L (ref 96–112)
GFR calc Af Amer: >90 mL/min (ref >90)
GFR calc non Af Amer: >90 mL/min (ref >90)
GLUCOSE: 120 mg/dL — AB (ref 70–99)
Potassium: 2.7 mEq/L — CL (ref 3.7–5.3)
Sodium: 145 mEq/L (ref 137–147)

## 2013-04-30 LAB — LIPID PANEL
Cholesterol: 198 mg/dL (ref 0–200)
HDL: 42 mg/dL (ref >39)
LDL Cholesterol: 127 mg/dL — ABNORMAL HIGH (ref 0–99)
Total CHOL/HDL Ratio: 4.7 RATIO
Triglycerides: 146 mg/dL (ref <150)
VLDL: 29 mg/dL (ref 0–40)

## 2013-04-30 LAB — GLUCOSE, CAPILLARY
GLUCOSE-CAPILLARY: 95 mg/dL (ref 70–99)
GLUCOSE-CAPILLARY: 123 mg/dL — AB (ref 70–99)
Glucose-Capillary: 110 mg/dL — ABNORMAL HIGH (ref 70–99)
Glucose-Capillary: 130 mg/dL — ABNORMAL HIGH (ref 70–99)

## 2013-04-30 LAB — CBC
HCT: 34.6 % — ABNORMAL LOW (ref 36.0–46.0)
Hemoglobin: 11.3 g/dL — ABNORMAL LOW (ref 12.0–15.0)
MCH: 29 pg (ref 26.0–34.0)
MCHC: 32.7 g/dL (ref 30.0–36.0)
MCV: 88.7 fL (ref 78.0–100.0)
Platelets: 245 10*3/uL (ref 150–400)
RBC: 3.9 MIL/uL (ref 3.87–5.11)
RDW: 15.7 % — AB (ref 11.5–15.5)
WBC: 6.5 10*3/uL (ref 4.0–10.5)

## 2013-04-30 LAB — HEMOGLOBIN A1C
HEMOGLOBIN A1C: 5.8 % — AB (ref <5.7)
MEAN PLASMA GLUCOSE: 120 mg/dL — AB (ref <117)

## 2013-04-30 LAB — MAGNESIUM: Magnesium: 1.7 mg/dL (ref 1.5–2.5)

## 2013-04-30 MED ORDER — POTASSIUM CHLORIDE CRYS ER 20 MEQ PO TBCR: 20.0000 meq | EXTENDED_RELEASE_TABLET | Freq: Two times a day (BID) | ORAL | Status: DC

## 2013-04-30 MED ORDER — LORAZEPAM 1 MG PO TABS
1.0000 mg | ORAL_TABLET | ORAL | Status: AC
  Administered 2013-04-30: 1 mg via ORAL
  Filled 2013-04-30: qty 1

## 2013-04-30 MED ORDER — POTASSIUM CHLORIDE 10 MEQ/100ML IV SOLN
10.0000 meq | INTRAVENOUS | Status: AC
  Administered 2013-04-30 (×6): 10 meq via INTRAVENOUS
  Filled 2013-04-30 (×6): qty 100

## 2013-04-30 NOTE — Evaluation (Signed)
Physical Therapy Evaluation Patient Details Name: Katherine [name] MRN: [id number] DOB: [birth year] Today's Date: 04/30/2013   History of Present Illness  pt is a 68 year old who presented with altered mental status, headache, confusion, and facial droop; PMH diabetes mellitus, high blood pressure, high cholesterol, arthritis, fibromyalgia.  MRI negative for acute infarct  Clinical Impression  Pt admitted with altered mental status, confusion, and facial droop.  Pt currently with functional limitations due to the deficits listed below (see PT Problem List).  Sensation was intact bilaterally.  Pt reported R LE felt generally weak.  Decreased R dorsiflexion and a limp were noted during ambulation in hallway, however upon testing, no strength discrepancies were noted in comparison to the L LE.  Pt had increased sway in standing and walking and has a history of falls but had no loss of balance during ambulation or with balance challenges including head turn, backward walking, reaching, and sudden stops.  Pt was encouraged to use rolling walker at home which she typically declines to use.  Pt and close friend reports pt is not back to baseline, primarily concerned with facial droop and R sided weakness.  No further acute PT needs identified, however pt would benefit from HHPT for increased independent balance and mobility.    Follow Up Recommendations Home health PT    Equipment Recommendations  None recommended by PT    Recommendations for Other Services       Precautions / Restrictions Precautions Precautions: Fall Precaution Comments: pt has a history of falls Restrictions Weight Bearing Restrictions: No      Mobility  Bed Mobility Overal bed mobility: Needs Assistance Bed Mobility: Supine to Sit     Supine to sit: HOB elevated;Min guard     General bed mobility comments: verbal cues for safety   Transfers Overall transfer level: Needs assistance   Transfers: Sit to/from  Stand Sit to Stand: Min guard         General transfer comment: verbal cues for safety; slight unsteadiness upon standing, pt recovered on own  Ambulation/Gait Ambulation/Gait assistance: Min guard Ambulation Distance (Feet): 140 Feet Assistive device: None Gait Pattern/deviations: Decreased dorsiflexion - right     General Gait Details: verbal cues for safety; decreased R dorsiflexion observed during gait, when tested, equal strength to L ankle; pt reports RLE feeling weaker and has observable limp  Stairs            Wheelchair Mobility    Modified Rankin (Stroke Patients Only)       Balance Overall balance assessment: Needs assistance;History of Falls Sitting-balance support: Bilateral upper extremity supported;Feet supported Sitting balance-Leahy Scale: Good     Standing balance support: No upper extremity supported;During functional activity Standing balance-Leahy Scale: Good               High level balance activites: Backward walking;Head turns;Sudden stops High Level Balance Comments: pt appears to have increased sway in standing and walking however had no LOB; able to perform ambulation with balance challenges and able to reach UE while ambulating             Pertinent Vitals/Pain Activity to tolerance, no complaints of pain.    Home Living Family/patient expects to be discharged to:: Private residence Living Arrangements: Alone Available Help at Discharge: Family;Friend(s) Type of Home: Apartment Home Access: Stairs to enter   Entrance Stairs-Number of Steps: 1 Home Layout: One level Home Equipment: [place] - 2 wheels;Bedside commode Additional Comments: pt and friend reports  she does not use her walker, both agree she should begin using it upon discharge    Prior Function Level of Independence: Independent with assistive device(s)         Comments: pt has aide  and family that assist at her baseline     Hand Dominance         Extremity/Trunk Assessment   Upper Extremity Assessment: Generalized weakness           Lower Extremity Assessment: Overall WFL for tasks assessed (pt reports RLE feeling weaker; MMT shows good and equal strength in both LE; sensation intact bilaterally)      Cervical / Trunk Assessment: Normal  Communication   Communication: No difficulties  Cognition Arousal/Alertness: Awake/alert Behavior During Therapy: WFL for tasks assessed/performed Overall Cognitive Status: Within Functional Limits for tasks assessed                      General Comments General comments (skin integrity, edema, etc.): facial drooping still apparent; pt reports she does not feel back to baseline    Exercises        Assessment/Plan    PT Assessment All further PT needs can be met in the next venue of care  PT Diagnosis     PT Problem List Decreased balance;Decreased knowledge of use of DME;Decreased mobility  PT Treatment Interventions     PT Goals (Current goals can be found in the Care Plan section) Acute Rehab PT Goals PT Goal Formulation: No goals set, d/c therapy    Frequency     Barriers to discharge        Co-evaluation               End of Session Equipment Utilized During Treatment: Gait belt Activity Tolerance: Patient tolerated treatment well Patient left: in chair;with call bell/phone within reach;with chair alarm set;with family/visitor present           Time: 5749-3552 PT Time Calculation (min): 16 min   Charges:   PT Evaluation $Initial PT Evaluation Tier I: 1 Procedure PT Treatments $Gait Training: 8-22 mins   PT G Codes:          Jacqulyn Cane 04/30/2013, 3:22 PM Jacqulyn Cane SPT 04/30/2013

## 2013-04-30 NOTE — Evaluation (Signed)
Occupational Therapy Evaluation Patient Details Name: Katherine [name] MRN: [id number] DOB: [birth year] Today's Date: 04/30/2013    History of Present Illness 68 yr old medicare pt brought in by best friend when noted with confusion, facial droop    Clinical Impression   Pt presents to OT with decreased I with ADL activity and will benefit from skilled OT to increase I with ADL activity and return to pLOF    Follow Up Recommendations  Home health OT;Supervision - Intermittent    Equipment Recommendations  None recommended by OT       Precautions / Restrictions Precautions Precautions: Fall      Mobility Bed Mobility Overal bed mobility: Needs Assistance Bed Mobility: Supine to Sit     Supine to sit: Min assist        Transfers Overall transfer level: Needs assistance                         ADL Eating/Feeding: Set up;Sitting Grooming: Set up;Sitting   Upper Body Dressing : Set up;Sitting Lower Body Bathing: Minimal assistance;Sit to/from stand Lower Body Dressing: Minimal assistance;Sit to/from stand Toilet Transfer: Minimal assistance;Comfort height toilet Toileting- Clothing Manipulation and Hygiene: Minimal assistance;Sit to/from stand   Functional mobility during ADLs: Min guard          Perception     Praxis            Hand Dominance     Extremity/Trunk Assessment Upper Extremity Assessment Upper Extremity Assessment: Generalized weakness           Communication Communication Communication: No difficulties   Cognition Arousal/Alertness: Awake/alert Behavior During Therapy: WFL for tasks assessed/performed Overall Cognitive Status: Within Functional Limits for tasks assessed                     General Comments       Exercises      Home Living Family/patient expects to be discharged to:: Private residence Living Arrangements: Alone Available Help at Discharge: Family;Friend(s) Type of Home: Apartment        Home Layout: One level     Bathroom Shower/Tub: Tub/shower unit Shower/tub characteristics: Engineer, building services: Standard     Home Equipment: Environmental consultant - 2 wheels;Bedside commode          Prior Functioning/Environment Level of Independence: Independent        Comments: pt has aide  and family that assist at her baseline       OT Problem List: Decreased strength;Decreased activity tolerance   OT Treatment/Interventions: Self-care/ADL training;DME and/or AE instruction;Patient/family education    OT Goals(Current goals can be found in the care plan section) Acute Rehab OT Goals Patient Stated Goal: get back home OT Goal Formulation: With patient Time For Goal Achievement: 05/14/13 Potential to Achieve Goals: Good ADL Goals Pt Will Perform Grooming: with modified independence;standing Pt Will Perform Upper Body Bathing: with modified independence;sitting Pt Will Perform Lower Body Bathing: with modified independence;sit to/from stand Pt Will Perform Upper Body Dressing: with modified independence;sitting Pt Will Perform Lower Body Dressing: with modified independence;sit to/from stand Pt Will Transfer to Toilet: with modified independence;regular height toilet Pt Will Perform Toileting - Clothing Manipulation and hygiene: with modified independence;sit to/from stand  OT Frequency: Min 2X/week           End of Session:    Activity Tolerance: Patient tolerated treatment well Patient left: in chair;with call bell/phone within reach;with nursing/sitter in room  Time: [id number] OT Time Calculation (min): 20 min Charges:  OT General Charges $OT Visit: 1 Procedure OT Evaluation $Initial OT Evaluation Tier I: 1 Procedure OT Treatments $Self Care/Home Management : 8-22 mins G-Codes:    Einar CrowEDDING, Enola Siebers D 04/30/2013, 10:16 AM

## 2013-04-30 NOTE — Discharge Summary (Signed)
Physician Discharge Summary  Katherine [name] ZOX:[id number] DOB: 12/16/1945 DOA: 04/29/2013  PCP: Oliver BarreJames John, MD  Admit date: 04/29/2013 Discharge date: 04/30/2013  Time spent: 35 minutes  Recommendations for Outpatient Follow-up:  1. Repeat potassium in about one week-discharged on replacement potassium 40 mg twice a day 2. Has polypharmacy which needs to be simplified as an outpatient-this admission we have discontinued her cetirizine Zanaflex  3. She is to continue her doxycycline and rifampicin until she sees Dr. 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 Followup urine culture-has not been discharge with antibiotics as was unlikely cause for confusion  Discharge Diagnoses:  Active Problems:   DIABETES MELLITUS, TYPE II   HYPERLIPIDEMIA   ANXIETY   HYPERTENSION   GERD   FIBROMYALGIA   Infection of left total knee replacement   Altered mental status   Facial droop   Discharge Condition: Fair  Diet recommendation: Diabetic heart healthy  Filed Weights   04/29/13 1525 04/30/13 0546  Weight: 63.64 kg (140 lb 4.8 oz) 63.821 kg (140 lb 11.2 oz)    History of present illness:  68 year old female admitted overnight 3/31 with altered mental status.  As she had some right facial droop and neglect of her right hand she was admitted and stroke was ruled out by MRI and MRA She was found to have elevated blood pressure as well as potential pyelonephritis however this was clean catch urine  Hospital Course:  Patient admitted overnight with acute metabolic encephalopathy an MRI brain was performed which showed very small tiny hemorrhages in the brain consistent with hypertensive damage to the brain without any breast syndrome or other issues. She was given IV fluids and her potassium was replaced. She was given diabetic diet and came back to her baseline. She was noted to have multiple similar medications on board and her  Zanaflex as well as her cetirizine were discontinued as this may contribute to her lethargy. She was encouraged for her care physician for further discussion of these things. Her ciprofloxacin was discontinued as it was not thought that she had a UTI pyelonephritis Her potassium is replaced IV and she was given prescription for by mouth potassium on discharge    Discharge Exam: Filed Vitals:   04/30/13 1043  BP: 158/95  Pulse: 85  Temp: 97.6 F (36.4 C)  Resp: 18   Pleasant oriented no apparent distress General: EOMI NCAT Cardiovascular: S1-S2 no murmur gallop Respiratory: Clinically clear  Discharge Instructions  Discharge Orders   Future Appointments Provider Department Dept Phone   05/09/2013 10:15 AM Corwin LevinsJames W John, MD [facility] [phone]   08/14/2013 2:30 PM Randall Hissornelius N Van Dam, MD [facility] [phone]   Future Orders Complete By Expires   Diet - low sodium heart healthy  As directed    Discharge instructions  As directed    Comments:     See your doctor for discussion of simplifying your medications. Potassium was low so repeat labs in 1 week   Increase activity slowly  As directed        Medication List    STOP taking these medications       cetirizine 10 MG tablet  Commonly known as:  ZYRTEC     promethazine 12.5 MG tablet  Commonly known as:  PHENERGAN     tiZANidine 4 MG tablet  Commonly known as:  ZANAFLEX  TAKE these medications       albuterol 108 (90 BASE) MCG/ACT inhaler  Commonly known as:  PROVENTIL HFA;VENTOLIN HFA  Inhale 2 puffs into the lungs every 6 (six) hours as needed for wheezing or shortness of breath.     amLODipine 5 MG tablet  Commonly known as:  NORVASC  Take 5 mg by mouth every morning.     aspirin 325 MG EC tablet  Take 1 tablet (325 mg total) by mouth 2 (two) times daily after a meal.     atorvastatin 40 MG tablet  Commonly known as:  LIPITOR   Take 40 mg by mouth every morning.     azelastine 137 MCG/SPRAY nasal spray  Commonly known as:  ASTELIN  Place 1 spray into both nostrils 2 (two) times daily. Use in each nostril as directed     benztropine 0.5 MG tablet  Commonly known as:  COGENTIN  Take 0.5 mg by mouth 2 (two) times daily.     calcium-vitamin D 500-200 MG-UNIT per tablet  Commonly known as:  OSCAL WITH D  Take 1 tablet by mouth 3 (three) times daily.     doxazosin 8 MG tablet  Commonly known as:  CARDURA  Take 8 mg by mouth every morning.     doxycycline 100 MG tablet  Commonly known as:  VIBRA-TABS  Take 1 tablet (100 mg total) by mouth 2 (two) times daily.     etodolac 400 MG tablet  Commonly known as:  LODINE  Take 400 mg by mouth 2 (two) times daily.     fluticasone 50 MCG/ACT nasal spray  Commonly known as:  FLONASE  Place 2 sprays into both nostrils daily.     ipratropium 0.06 % nasal spray  Commonly known as:  ATROVENT  Place 1 spray into both nostrils 3 (three) times daily.     mometasone 50 MCG/ACT nasal spray  Commonly known as:  NASONEX  Place 2 sprays into the nose daily.     multivitamin with minerals Tabs tablet  Take 1 tablet by mouth daily.     omeprazole 20 MG capsule  Commonly known as:  PRILOSEC  Take 20 mg by mouth daily.     potassium chloride SA 20 MEQ tablet  Commonly known as:  K-DUR,KLOR-CON  Take 1 tablet (20 mEq total) by mouth 2 (two) times daily.     pregabalin 75 MG capsule  Commonly known as:  LYRICA  Take 1 capsule (75 mg total) by mouth 2 (two) times daily.     rifampin 300 MG capsule  Commonly known as:  RIFADIN  Take 1 capsule (300 mg total) by mouth every 12 (twelve) hours.     risperiDONE 3 MG tablet  Commonly known as:  RISPERDAL  Take 3 mg by mouth at bedtime.     traMADol 200 MG 24 hr tablet  Commonly known as:  ULTRAM-ER  Take 200 mg by mouth daily as needed for pain.       Allergies  Allergen Reactions  . Penicillins     "made me  nervous"  . Thioridazine Hcl Itching      The results of significant diagnostics from this hospitalization (including imaging, microbiology, ancillary and laboratory) are listed below for reference.    Significant Diagnostic Studies: Ct Head Wo Contrast  04/29/2013   CLINICAL DATA:  Altered mental status. Diabetic hypertensive hyperlipidemia patient.  EXAM: CT HEAD WITHOUT CONTRAST  TECHNIQUE: Contiguous axial images were obtained from the base  of the skull through the vertex without intravenous contrast.  COMPARISON:  No comparison head CT.  Sinus CT 09/11/2003.  FINDINGS: No intracranial hemorrhage.  Peri vascular space versus prior small lacune infarct left lenticular nucleus. No CT evidence of large acute infarct.  No intracranial mass lesion noted on this unenhanced exam.  No hydrocephalus.  Orbital structures unremarkable.  IMPRESSION: No intracranial hemorrhage or CT evidence of large acute infarct.  Please see above.   Electronically Signed   By: Bridgett Larsson M.D.   On: 04/29/2013 11:41   Mr Brain Wo Contrast  04/30/2013   CLINICAL DATA:  Altered mental status.  EXAM: MRI HEAD WITHOUT CONTRAST  TECHNIQUE: Multiplanar, multiecho pulse sequences of the brain and surrounding structures were obtained without intravenous contrast.  COMPARISON:  CT 04/29/2013  FINDINGS: Negative for acute infarct.  Scattered small white matter hyperintensities consistent with chronic microvascular ischemia. Scattered areas of chronic micro hemorrhage, likely related to hypertension.  Ventricle size is normal. Negative for mass or edema. No midline shift.  Mucosal edema in the paranasal sinuses  IMPRESSION: Negative for acute infarct.  Mild chronic microvascular ischemic change and several areas of chronic micro hemorrhage in the brain consistent with chronic hypertension.   Electronically Signed   By: Marlan Palau M.D.   On: 04/30/2013 12:40   Dg Chest Port 1 View  04/29/2013   CLINICAL DATA:  Altered mental  status  EXAM: PORTABLE CHEST - 1 VIEW  COMPARISON:  12/17/2012  FINDINGS: Decreased lung volume compared with the prior study. Mild left lower lobe atelectasis. Negative for heart failure or pneumonia or effusion.  IMPRESSION: Hypoventilation with left lower lobe atelectasis.   Electronically Signed   By: Marlan Palau M.D.   On: 04/29/2013 11:19    Microbiology: No results found for this or any previous visit (from the past 240 hour(s)).   Labs: Basic Metabolic Panel:  Recent Labs Lab 04/29/13 1014 04/30/13 0335  NA 144 145  K 3.5* 2.7*  CL 101 107  CO2 27 26  GLUCOSE 219* 120*  BUN 19 12  CREATININE 0.80 0.53  CALCIUM 9.8 8.8  MG  --  1.7   Liver Function Tests:  Recent Labs Lab 04/29/13 1014  AST 62*  ALT 55*  ALKPHOS 88  BILITOT 0.4  PROT 7.9  ALBUMIN 4.0    Recent Labs Lab 04/29/13 1015  LIPASE 10*   No results found for this basename: AMMONIA,  in the last 168 hours CBC:  Recent Labs Lab 04/29/13 1014 04/30/13 0335  WBC 9.6 6.5  NEUTROABS 7.5  --   HGB 14.1 11.3*  HCT 42.8 34.6*  MCV 87.0 88.7  PLT 373 245   Cardiac Enzymes:  Recent Labs Lab 04/29/13 1014 04/29/13 1350 04/29/13 1656 04/29/13 2207  TROPONINI <0.30 <0.30 <0.30 <0.30   BNP: BNP (last 3 results) No results found for this basename: PROBNP,  in the last 8760 hours CBG:  Recent Labs Lab 04/29/13 1659 04/29/13 2129 04/30/13 0731 04/30/13 1227  GLUCAP 90 104* 95 110*       Signed:  Jacara Benito, JAI-GURMUKH  [facility] 04/30/2013, 1:05 PM

## 2013-04-30 NOTE — Evaluation (Signed)
I have reviewed this note and agree with all findings. Kati Aarionna Germer, PT, DPT Pager: [phone]   

## 2013-04-30 NOTE — Progress Notes (Signed)
Spoke with pt concerning discharge plans and Home Health. Pt selected [facility] for HHRN/HHPT. Referral given to in house rep with [facility].

## 2013-04-30 NOTE — Evaluation (Signed)
Speech Language Pathology Evaluation Patient Details Name: Katherine [name] MRN: [id number] DOB: 1945/10/01 Today's Date: 04/30/2013 Time: 4098-1191 SLP Time Calculation (min): 21 min  Problem List:  Patient Active Problem List   Diagnosis Date Noted  . Altered mental status 04/29/2013  . Facial droop 04/29/2013  . Normocytic anemia 01/06/2013  . 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 Thyroid nodule 11/07/2012  . Aortic stenosis, mild 05/27/2012  . Heart murmur, systolic 05/09/2012  . Preventative health care 10/07/2010  . FATIGUE 04/15/2010  . Dysuria 04/15/2010  . FRACTURE, PELVIS, RIGHT 04/15/2010  . Failure to thrive 04/06/2010  . SPRAIN&STRAIN OTH SPEC SITES SHOULDER&UPPER ARM 11/01/2009  . ABNORMAL THYROID FUNCTION TESTS 08/04/2009  . SKIN LESION 05/10/2009  . FIBROMYALGIA 05/10/2009  . DIABETES MELLITUS, TYPE II 07/31/2007  . ANXIETY 07/31/2007  . OSTEOPOROSIS 07/31/2007  . HYPERLIPIDEMIA 09/05/2006  . DEPRESSION 09/05/2006  . HYPERTENSION 09/05/2006  . ALLERGIC RHINITIS 09/05/2006  . GERD 09/05/2006  . DIVERTICULOSIS, COLON 09/05/2006  . OSTEOARTHRITIS 09/05/2006   Past Medical History:  Past Medical History  Diagnosis Date  . ABDOMINAL PAIN, LOWER 02/04/2010  . ABNORMAL THYROID FUNCTION TESTS 08/04/2009  . ALLERGIC RHINITIS 09/05/2006  . ANXIETY 07/31/2007  . DEPRESSION 09/05/2006  . DIVERTICULOSIS, COLON 09/05/2006  . Dysuria 04/15/2010  . Failure to thrive in childhood 04/06/2010  . FATIGUE 04/15/2010  . FIBROMYALGIA 05/10/2009  . FRACTURE, PELVIS, RIGHT 04/15/2010  . GASTROENTERITIS, ACUTE 12/25/2008  . GERD 09/05/2006  . HYPERLIPIDEMIA 09/05/2006  . HYPERTENSION 09/05/2006  . KNEE PAIN, LEFT 02/04/2010  . OSTEOARTHRITIS 09/05/2006  . OSTEOPOROSIS 07/31/2007  . Other specified forms of hearing loss 11/01/2009  . SINUSITIS- ACUTE-NOS 10/09/2007  . SKIN LESION 05/10/2009  . SPRAIN&STRAIN OTH SPEC SITES SHOULDER&UPPER ARM 11/01/2009  . Unspecified hearing loss  10/09/2007  . URI 02/20/2007  . UTI 02/20/2007  . Blood transfusion   . Cataract     resolved -surgery done  . Heart murmur   . DIABETES MELLITUS, TYPE II 07/31/2007    no meds   Past Surgical History:  Past Surgical History  Procedure Laterality Date  . Tonsillectomy    . Breast biopsy    . Abdominal hysterectomy  12/08    Bladder tac, partial hysterectomy  . Inguinal herniorrhapy      right Dr. Freida Busman 06/2009  . Catarct removal      both eyes  . Total knee arthroplasty Left 12/20/2012    Procedure: LEFT TOTAL KNEE ARTHROPLASTY;  Surgeon: Kathryne Hitch, MD;  Location: [facility];  Service: Orthopedics;  Laterality: Left;  . I&d knee with poly exchange Left 01/03/2013    Procedure: IRRIGATION AND DEBRIDEMENT LEFT KNEE WITH POLY EXCHANGE;  Surgeon: Kathryne Hitch, MD;  Location: [facility];  Service: Orthopedics;  Laterality: Left;   HPI:  68 yo female adm with ? right facial droop, AMS.  Pt brain MRI 4/1 negative for acute stroke - mild chronic microvascular ischemic change and several areas of chronic micro hemorrhage in the brain consistent with chronic HTN.  Speech language evaluation ordered.     Assessment / Plan / Recommendation Clinical Impression  Speech and language were fluent without dysarthria nor aphasia.   Pt appeared with "young" inflection pattern in speech- which she states is developmental.   She was oriented x4 and was able to follow multiple step commands with repetition due to hearing loss.    Ms Tschida reports her neice states that her speech is difficult to understand -  therefore SLP provided her with compensation strategies to maximize intelligiblity.  Most effective strategies for pt included slowing rate and overarticulating-with pt demonstrating with mod I.  Pt presents with functional cognitive linguistic abilities for her home environment (she has a caregiver for 2 hours M-F).  Advised pt to have caregiver/family check to assure medicine, finances and  MD appts managed.  No further SLP indicated as pt with negative MRI, appears at baseline level of function and all education is completed.     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 ) Safety/Judgment: Appears intact (verbalized need to use call bell for assist)    Comprehension  Auditory Comprehension Overall Auditory Comprehension: Appears within functional limits for tasks assessed Yes/No Questions: Not tested Commands: Within Functional Limits Conversation: Complex Interfering Components: Hearing EffectiveTechniques: Repetition Visual Recognition/Discrimination Discrimination: Within Function Limits Reading Comprehension Reading Status: Within funtional limits (pt able to read words on word search and locate with delay  - but she states this is baseline, read calendar)    Expression Expression Primary Mode of Expression: Verbal Verbal Expression Overall Verbal Expression: Appears within functional limits for tasks assessed Initiation: No impairment Level of Generative/Spontaneous Verbalization: Conversation Repetition:  (DNT) Naming: Not tested Pragmatics: No  impairment Non-Verbal Means of Communication: Not applicable Written Expression Dominant Hand: Right Written Expression: Not tested (pt able to circle word on word search, she has reading glasses at home)   Oral / Motor Oral Motor/Sensory Function Overall Oral Motor/Sensory Function: Appears within functional limits for tasks assessed Motor Speech Overall Motor Speech: Appears within functional limits for tasks assessed Respiration: Within functional limits Phonation: Normal Resonance: Within functional limits Articulation: Within functional limitis Intelligibility: Intelligible Motor Planning: Witnin functional limits Motor Speech Errors: Not applicable   GO     Mills KollerKimball, Andrienne Havener Ann Syd Newsome, MS [facility] SLP [phone]

## 2013-04-30 NOTE — Progress Notes (Signed)
Speech language evaluation completed, full report to follow.  Pt reports having aide at home for 2 hours - 5 days a week who helps with medicine management.  She uses a calender to recall appts and has help from her friend Lucendia HerrlichFaye for bill paying/management per pt.  Pt with fluent speech-language - no speech needs identified.     Note results of MRI - negative for acute changes.  Pt independently stated results of MRI without any cues.    Donavan Burnetamara Kinlie Janice, MS [facility] SLP [phone]

## 2013-04-30 NOTE — Evaluation (Signed)
SLP Cancellation Note  Patient Details Name: Katherine [name] MRN: [id number] DOB: 02/26/1945   Cancelled treatment:       Reason Eval/Treat Not Completed: Patient at procedure or test/unavailable (pt not in room, locations states MRI, will reattempt evaluation later)   Mills KollerKimball, Oluwakemi Salsberry Ann Kason Benak, MS [facility] SLP [phone]

## 2013-04-30 NOTE — Progress Notes (Signed)
Nutrition Brief Note  Patient identified on the Malnutrition Screening Tool (MST) Report  Wt Readings from Last 15 Encounters:  04/30/13 140 lb 11.2 oz (63.821 kg)  04/14/13 151 lb (68.493 kg)  03/11/13 156 lb 8 oz (70.988 kg)  02/13/13 165 lb (74.844 kg)  01/28/13 168 lb (76.204 kg)  01/22/13 164 lb 8 oz (74.617 kg)  01/10/13 176 lb (79.833 kg)  01/04/13 193 lb 12.6 oz (87.9 kg)  01/04/13 193 lb 12.6 oz (87.9 kg)  12/20/12 164 lb (74.39 kg)  12/20/12 164 lb (74.39 kg)  12/17/12 164 lb (74.39 kg)  11/07/12 170 lb 4 oz (77.225 kg)  05/09/12 159 lb 2 oz (72.179 kg)  12/07/11 153 lb 8 oz (69.627 kg)    Body mass index is 29.14 kg/(m^2). Patient meets criteria for Overweight based on current BMI.   Current diet order is Carb Modified, patient is consuming approximately >75% of meals at this time. Labs and medications reviewed.   Pt denied any changes in appetite. Has had an intentional wt loss through diet modification and for blood glucose control. Was interested in learning more about carbohydrate counting and DM related recipes.Pt appeared slightly confused and was being taken for MRI, will reattempt education during admit  No nutrition interventions warranted at this time. If nutrition issues arise, please consult RD.    Lloyd HugerSarah F Elery Cadenhead MS RD LDN Clinical Dietitian [phone]

## 2013-04-30 NOTE — Progress Notes (Signed)
CSW received call from unit secretary that patient would like to speak with CSW re: discharge. [facility], but patient plans to return home with home health at discharge. CSW left message for RNCM, Cookie to make her aware of patient's plans to return home. Patient's friend at bedside to transport home this afternoon.   No further CSW needs identified - CSW signing off.   Katherine MaxinKelly Jemario Poitras, LCSW [facility] Clinical Social Worker cell #: [phone]

## 2013-04-30 NOTE — ED Provider Notes (Signed)
Medical screening examination/treatment/procedure(s) were conducted as a shared visit with non-physician practitioner(s) and myself.  I personally evaluated the patient during the encounter. Pt presents w/ friend a/ AMS. On PE, she is in NAD, GCS 14.  No focal neuro findings, though had 4/5 strength throughout. Pt found to have UTI which is likely cause of symptoms.  Pt admitted to medical service.   EKG Interpretation   Date/Time:  Tuesday April 29 2013 10:02:02 EDT Ventricular Rate:  115 PR Interval:  128 QRS Duration: 90 QT Interval:  329 QTC Calculation: 455 R Axis:   -50 Text Interpretation:  Sinus tachycardia Ventricular premature complex Left  anterior fascicular block minimal ST depression I, aVL, II, V2, V4  Borderline repolarization abnormality Confirmed by DOCHERTY  MD, MEGAN  ([id number]) on 04/29/2013 1:22:04 PM        Shanna CiscoMegan E Docherty, MD 04/30/13 [phone]

## 2013-05-01 LAB — BASIC METABOLIC PANEL
BUN: 16 mg/dL (ref 6–23)
CO2: 29 meq/L (ref 19–32)
Calcium: 9.6 mg/dL (ref 8.4–10.5)
Chloride: 103 mEq/L (ref 96–112)
Creatinine, Ser: 0.62 mg/dL (ref 0.50–1.10)
GFR calc Af Amer: >90 mL/min (ref >90)
GFR calc non Af Amer: >90 mL/min (ref >90)
GLUCOSE: 99 mg/dL (ref 70–99)
POTASSIUM: 3.8 meq/L (ref 3.7–5.3)
Sodium: 141 mEq/L (ref 137–147)

## 2013-05-01 LAB — CBC
HCT: 33.9 % — ABNORMAL LOW (ref 36.0–46.0)
HEMOGLOBIN: 10.7 g/dL — AB (ref 12.0–15.0)
MCH: 28.2 pg (ref 26.0–34.0)
MCHC: 31.6 g/dL (ref 30.0–36.0)
MCV: 89.4 fL (ref 78.0–100.0)
Platelets: 217 10*3/uL (ref 150–400)
RBC: 3.79 MIL/uL — ABNORMAL LOW (ref 3.87–5.11)
RDW: 15.4 % (ref 11.5–15.5)
WBC: 5.6 10*3/uL (ref 4.0–10.5)

## 2013-05-01 LAB — GLUCOSE, CAPILLARY
Glucose-Capillary: 83 mg/dL (ref 70–99)
Glucose-Capillary: 107 mg/dL — ABNORMAL HIGH (ref 70–99)

## 2013-05-01 MED ORDER — CLONIDINE HCL 0.1 MG PO TABS
0.1000 mg | ORAL_TABLET | ORAL | Status: AC
  Administered 2013-05-01: 0.1 mg via ORAL
  Filled 2013-05-01: qty 1

## 2013-05-01 MED ORDER — AMLODIPINE BESYLATE 10 MG PO TABS: 5.0000 mg | ORAL_TABLET | Freq: Every morning | ORAL | Status: DC

## 2013-05-01 MED ORDER — POTASSIUM CHLORIDE CRYS ER 20 MEQ PO TBCR: 20.0000 meq | EXTENDED_RELEASE_TABLET | Freq: Two times a day (BID) | ORAL | Status: DC

## 2013-05-01 NOTE — Progress Notes (Signed)
Patient discharged, RN going over AVS with patient and patient had an episode of nausea with vomiting. Notified MD, and held off on discharging patient to evaluate patient condition throughout the evening. Will continue to monitor. J.Jamison Soward, RN

## 2013-05-01 NOTE — Progress Notes (Signed)
Occupational Therapy Treatment Patient Details Name: Katherine [name] MRN: [id number] DOB: 04/18/1945 Today's Date: 05/01/2013    History of present illness pt is a 68 year old who presented with altered mental status, headache, confusion, and facial droop; PMH diabetes mellitus, high blood pressure, high cholesterol, arthritis, fibromyalgia.  MRI negative for acute infarct      Follow Up Recommendations  Home health OT;Supervision - Intermittent    Equipment Recommendations  None recommended by OT       Precautions / Restrictions Precautions Precautions: Fall Precaution Comments: pt has a history of falls Restrictions Weight Bearing Restrictions: No       Mobility Bed Mobility Overal bed mobility: Modified Independent                Transfers Overall transfer level: Modified independent                        ADL                       Lower Body Dressing: Modified independent;Sit to/from stand   Toilet Transfer: Modified Independent;Comfort height toilet   Toileting- Clothing Manipulation and Hygiene: Modified independent;Sit to/from stand   Tub/ Engineer, structuralhower Transfer: Walk-in shower;Modified independent   Functional mobility during ADLs: Modified independent                  Cognition   Behavior During Therapy: WFL for tasks assessed/performed Overall Cognitive Status: Within Functional Limits for tasks assessed                                    Frequency Min 2X/week     Progress Toward Goals  OT Goals(current goals can now be found in the care plan section)  Progress towards OT goals: Progressing toward goals  Acute Rehab OT Goals Patient Stated Goal: get back home OT Goal Formulation: With patient Time For Goal Achievement: 05/14/13  Plan            Activity Tolerance Patient tolerated treatment well   Patient Left in chair;with call bell/phone within reach;with nursing/sitter in room   Nurse  Communication Mobility status        Time: [id number] OT Time Calculation (min): 25 min  Charges: OT General Charges $OT Visit: 1 Procedure OT Treatments $Self Care/Home Management : 23-37 mins  Glena Pharris D 05/01/2013, 9:04 AM

## 2013-05-01 NOTE — Progress Notes (Signed)
Events noted Blood pressures running higher Tolerated dinner per RN and is having breakfast now Denies dysuria, n, v, cp, fever/chills Increased BP meds on d/c-see new AVS  Pleas KochJai Tiago Humphrey, MD [facility] [phone]

## 2013-05-03 LAB — URINE CULTURE
Colony Count: 80000
Special Requests: NORMAL

## 2013-05-03 NOTE — ED Provider Notes (Signed)
Medical screening examination/treatment/procedure(s) were conducted as a shared visit with non-physician practitioner(s) and myself.  I personally evaluated the patient during the encounter. Pt presents w/ several days of AMS. No focal neuro findings on PE, though GCS 15. CT head unremarkable.  W/U c/w UTI which is likely cause of symptoms. Abx started. [facility].    EKG Interpretation   Date/Time:  Tuesday April 29 2013 10:02:02 EDT Ventricular Rate:  115 PR Interval:  128 QRS Duration: 90 QT Interval:  329 QTC Calculation: 455 R Axis:   -50 Text Interpretation:  Sinus tachycardia Ventricular premature complex Left  anterior fascicular block minimal ST depression I, aVL, II, V2, V4  Borderline repolarization abnormality Confirmed by Sabrinia Prien  MD, Embry Huss  ([id number]) on 04/29/2013 1:22:04 PM        Shanna CiscoMegan E Dariush Mcnellis, MD 05/03/13 1037

## 2013-05-09 ENCOUNTER — Encounter: Payer: Self-pay | Admitting: Internal Medicine

## 2013-05-09 ENCOUNTER — Ambulatory Visit (INDEPENDENT_AMBULATORY_CARE_PROVIDER_SITE_OTHER): Payer: PRIVATE HEALTH INSURANCE | Admitting: Internal Medicine

## 2013-05-09 ENCOUNTER — Other Ambulatory Visit (INDEPENDENT_AMBULATORY_CARE_PROVIDER_SITE_OTHER): Payer: PRIVATE HEALTH INSURANCE

## 2013-05-09 VITALS — BP 140/80 | HR 117 | Temp 98.4°F | Wt 148.0 lb

## 2013-05-09 DIAGNOSIS — J309 Allergic rhinitis, unspecified: Secondary | ICD-10-CM

## 2013-05-09 DIAGNOSIS — R4182 Altered mental status, unspecified: Secondary | ICD-10-CM

## 2013-05-09 DIAGNOSIS — I10 Essential (primary) hypertension: Secondary | ICD-10-CM

## 2013-05-09 DIAGNOSIS — N390 Urinary tract infection, site not specified: Secondary | ICD-10-CM

## 2013-05-09 DIAGNOSIS — E876 Hypokalemia: Secondary | ICD-10-CM

## 2013-05-09 DIAGNOSIS — E119 Type 2 diabetes mellitus without complications: Secondary | ICD-10-CM

## 2013-05-09 LAB — URINALYSIS, ROUTINE W REFLEX MICROSCOPIC
Bilirubin Urine: NEGATIVE
Hgb urine dipstick: NEGATIVE
Ketones, ur: NEGATIVE
Nitrite: NEGATIVE
TOTAL PROTEIN, URINE-UPE24: NEGATIVE
Urine Glucose: NEGATIVE
Urobilinogen, UA: 0.2 (ref 0.0–1.0)
pH: 6 (ref 5.0–8.0)

## 2013-05-09 LAB — BASIC METABOLIC PANEL
BUN: 21 mg/dL (ref 6–23)
CHLORIDE: 100 meq/L (ref 96–112)
CO2: 26 mEq/L (ref 19–32)
Calcium: 9.4 mg/dL (ref 8.4–10.5)
Creatinine, Ser: 0.7 mg/dL (ref 0.4–1.2)
GFR: 91.54 mL/min (ref >60.00)
Glucose, Bld: 97 mg/dL (ref 70–99)
POTASSIUM: 4.1 meq/L (ref 3.5–5.1)
SODIUM: 136 meq/L (ref 135–145)

## 2013-05-09 MED ORDER — METHYLPREDNISOLONE ACETATE 80 MG/ML IJ SUSP
80.0000 mg | Freq: Once | INTRAMUSCULAR | Status: AC
Start: 2013-05-09 — End: 2013-05-09
  Administered 2013-05-09: 80 mg via INTRAMUSCULAR

## 2013-05-09 NOTE — Assessment & Plan Note (Signed)
Resolved, back to baseline function,  to f/u any worsening symptoms or concerns

## 2013-05-09 NOTE — Assessment & Plan Note (Signed)
Asympt, for f/u UA today 

## 2013-05-09 NOTE — Assessment & Plan Note (Signed)
stable overall by history and exam, recent data reviewed with pt, and pt to continue medical treatment as before,  to f/u any worsening symptoms or concerns Lab Results  Component Value Date   HGBA1C 5.8* 04/30/2013

## 2013-05-09 NOTE — Patient Instructions (Addendum)
You had the steroid shot today  Please continue all other medications as before, and refills have been done if requested. Please have the pharmacy call with any other refills you may need.  Please continue your efforts at being more active, low cholesterol diet, and weight control.  Please go to the LAB in the Basement (turn left off the elevator) for the tests to be done today You will be contacted by phone if any changes need to be made immediately.  Otherwise, you will receive a letter about your results with an explanation, but please check with MyChart first.

## 2013-05-09 NOTE — Assessment & Plan Note (Signed)
For f/u bmp today

## 2013-05-09 NOTE — Progress Notes (Signed)
Pre visit review using our clinic review tool, if applicable. No additional management support is needed unless otherwise documented below in the visit note. 

## 2013-05-09 NOTE — Progress Notes (Signed)
Subjective:    Patient ID: Katherine [name], female    DOB: 03/06/1945, 68 y.o.   MRN: [id number]  HPI   Here after recent hospn with AMS, UTI/pyelonephritis, persist elevat BP, and hypokalemia now on BID potass replacement. S/p doxy course, urine cx + e coli, Denies urinary symptoms such as dysuria, frequency, urgency, flank pain, hematuria or n/v, fever, chills.  Pt denies new neurological symptoms such as new headache, or facial or extremity weakness or numbness   Pt denies polydipsia, polyuria,  Pt denies chest pain, increased sob or doe, wheezing, orthopnea, PND, increased LE swelling, palpitations, dizziness or syncope.  Does have several wks ongoing nasal allergy symptoms with clearish congestion, itch and sneezing, without fever, pain, ST, cough, swelling or wheezing. Last a1c approx 5.5 Past Medical History  Diagnosis Date  . ABDOMINAL PAIN, LOWER 02/04/2010  . ABNORMAL THYROID FUNCTION TESTS 08/04/2009  . ALLERGIC RHINITIS 09/05/2006  . ANXIETY 07/31/2007  . DEPRESSION 09/05/2006  . DIVERTICULOSIS, COLON 09/05/2006  . Dysuria 04/15/2010  . Failure to thrive in childhood 04/06/2010  . FATIGUE 04/15/2010  . FIBROMYALGIA 05/10/2009  . FRACTURE, PELVIS, RIGHT 04/15/2010  . GASTROENTERITIS, ACUTE 12/25/2008  . GERD 09/05/2006  . HYPERLIPIDEMIA 09/05/2006  . HYPERTENSION 09/05/2006  . KNEE PAIN, LEFT 02/04/2010  . OSTEOARTHRITIS 09/05/2006  . OSTEOPOROSIS 07/31/2007  . Other specified forms of hearing loss 11/01/2009  . SINUSITIS- ACUTE-NOS 10/09/2007  . SKIN LESION 05/10/2009  . SPRAIN&STRAIN OTH SPEC SITES SHOULDER&UPPER ARM 11/01/2009  . Unspecified hearing loss 10/09/2007  . URI 02/20/2007  . UTI 02/20/2007  . Blood transfusion   . Cataract     resolved -surgery done  . Heart murmur   . DIABETES MELLITUS, TYPE II 07/31/2007    no meds   Past Surgical History  Procedure Laterality Date  . Tonsillectomy    . Breast biopsy    . Abdominal hysterectomy  12/08    Bladder tac, partial hysterectomy  . Inguinal  herniorrhapy      right Dr. Freida BusmanAllen 06/2009  . Catarct removal      both eyes  . 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 I&d knee with poly exchange Left 01/03/2013    Procedure: IRRIGATION AND DEBRIDEMENT LEFT KNEE WITH POLY EXCHANGE;  Surgeon: Kathryne Hitchhristopher Y Blackman, MD;  Location: [facility];  Service: Orthopedics;  Laterality: Left;    reports that she has never smoked. She does not have any smokeless tobacco history on file. She reports that she does not drink alcohol or use illicit drugs. family history includes Coronary artery disease in her other; Diabetes in her other. There is no history of Colon cancer, Esophageal cancer, Stomach cancer, or Rectal cancer. Allergies  Allergen Reactions  . Penicillins     "made me nervous"  . Thioridazine Hcl Itching   Current Outpatient Prescriptions on File Prior to Visit  Medication Sig Dispense Refill  . albuterol (PROVENTIL HFA;VENTOLIN HFA) 108 (90 BASE) MCG/ACT inhaler Inhale 2 puffs into the lungs every 6 (six) hours as needed for wheezing or shortness of breath.      [name]. amLODipine (NORVASC) 10 MG tablet Take 0.5 tablets (5 mg total) by mouth every morning.  30 tablet  0  . aspirin EC 325 MG EC tablet Take 1 tablet (325 mg total) by mouth 2 (two) times daily after a meal.  45 tablet  0  . atorvastatin (LIPITOR) 40 MG tablet Take 40 mg by mouth every morning.      [name] azelastine (ASTELIN) 137 MCG/SPRAY nasal spray Place 1 spray into both nostrils 2 (two) times daily. Use in each nostril as directed      . benztropine (COGENTIN) 0.5 MG tablet Take 0.5 mg by mouth 2 (two) times daily.        . calcium-vitamin D (OSCAL WITH D) 500-200 MG-UNIT per tablet Take 1 tablet by mouth 3 (three) times daily.      [name] doxazosin (CARDURA) 8 MG tablet Take 8 mg by mouth every morning.      [name] doxycycline (VIBRA-TABS) 100 MG  tablet Take 1 tablet (100 mg total) by mouth 2 (two) times daily.  60 tablet  11  . etodolac (LODINE) 400 MG tablet Take 400 mg by mouth 2 (two) times daily.      . fluticasone (FLONASE) 50 MCG/ACT nasal spray Place 2 sprays into both nostrils daily.      [name] ipratropium (ATROVENT) 0.06 % nasal spray Place 1 spray into both nostrils 3 (three) times daily.      . mometasone (NASONEX) 50 MCG/ACT nasal spray Place 2 sprays into the nose daily.      . Multiple Vitamin (MULTIVITAMIN WITH MINERALS) TABS tablet Take 1 tablet by mouth daily.      [name] omeprazole (PRILOSEC) 20 MG capsule Take 20 mg by mouth daily.      . potassium chloride SA (K-DUR,KLOR-CON) 20 MEQ tablet Take 1 tablet (20 mEq total) by mouth 2 (two) times daily.  60 tablet  0  . pregabalin (LYRICA) 75 MG capsule Take 1 capsule (75 mg total) by mouth 2 (two) times daily.  60 capsule  5  . rifampin (RIFADIN) 300 MG capsule Take 1 capsule (300 mg total) by mouth every 12 (twelve) hours.  60 capsule  11  . risperiDONE (RISPERDAL) 3 MG tablet Take 3 mg by mouth at bedtime.       . traMADol (ULTRAM-ER) 200 MG 24 hr tablet Take 200 mg by mouth daily as needed for pain.       No current facility-administered medications on file prior to visit.    Review of Systems  Constitutional: Negative for unexpected weight change, or unusual diaphoresis  HENT: Negative for tinnitus.   Eyes: Negative for photophobia and visual disturbance.  Respiratory: Negative for choking and stridor.   Gastrointestinal: Negative for vomiting and blood in stool.  Genitourinary: Negative for hematuria and decreased urine volume.  Musculoskeletal: Negative for acute joint swelling Skin: Negative for color change and wound.  Neurological: Negative for tremors and numbness other than noted  Psychiatric/Behavioral: Negative for decreased concentration or  hyperactivity.       Objective:   Physical Exam BP 140/80  Pulse 117  Temp(Src) 98.4 F (36.9 C) (Oral)  Wt 148  lb (67.132 kg)  SpO2 94% VS noted,  Constitutional: Pt appears well-developed and well-nourished.  HENT: Head: NCAT.  Right Ear: External ear normal.  Left Ear: External ear normal.  Eyes: Conjunctivae and EOM are normal. Pupils are equal, round, and reactive to light.  Neck: Normal range of motion. Neck supple.  Cardiovascular: Normal rate and regular rhythm.  with gr 2/6 sys murmur RUSB (old) Pulmonary/Chest: Effort normal and breath sounds normal.  Abd:  Soft, NT, non-distended, + BS, no flank tender Neurological: Pt is alert. Not confused  Skin: Skin is warm. No erythema.  Psychiatric:  Pt behavior is normal. Thought content normal.     Assessment & Plan:

## 2013-05-09 NOTE — Assessment & Plan Note (Signed)
stable overall by history and exam, recent data reviewed with pt, and pt to continue medical treatment as before,  to f/u any worsening symptoms or concerns BP Readings from Last 3 Encounters:  05/09/13 140/80  05/01/13 127/97  04/14/13 133/85

## 2013-05-09 NOTE — Assessment & Plan Note (Signed)
For depomedrol IM today

## 2013-05-11 LAB — URINE CULTURE

## 2013-05-15 ENCOUNTER — Telehealth: Payer: Self-pay | Admitting: Internal Medicine

## 2013-05-15 MED ORDER — POTASSIUM CHLORIDE CRYS ER 20 MEQ PO TBCR: 20.0000 meq | EXTENDED_RELEASE_TABLET | Freq: Two times a day (BID) | ORAL | Status: DC

## 2013-05-15 NOTE — Telephone Encounter (Signed)
Pt needs a refill on potassium.  Pt wants her lab results.

## 2013-05-15 NOTE — Telephone Encounter (Signed)
Called the patient informed of results and sent in her potassium rx to [facility] as requested.

## 2013-05-16 DIAGNOSIS — E119 Type 2 diabetes mellitus without complications: Secondary | ICD-10-CM

## 2013-05-16 DIAGNOSIS — I6789 Other cerebrovascular disease: Secondary | ICD-10-CM

## 2013-05-16 DIAGNOSIS — E785 Hyperlipidemia, unspecified: Secondary | ICD-10-CM

## 2013-05-16 DIAGNOSIS — I10 Essential (primary) hypertension: Secondary | ICD-10-CM

## 2013-05-19 ENCOUNTER — Other Ambulatory Visit: Payer: Self-pay | Admitting: *Deleted

## 2013-05-19 MED ORDER — IPRATROPIUM BROMIDE 0.06 % NA SOLN: 1.0000 | Freq: Three times a day (TID) | NASAL | Status: DC

## 2013-05-20 ENCOUNTER — Other Ambulatory Visit: Payer: Self-pay | Admitting: *Deleted

## 2013-05-26 ENCOUNTER — Encounter: Payer: Self-pay | Admitting: Infectious Disease

## 2013-05-27 ENCOUNTER — Telehealth: Payer: Self-pay | Admitting: Internal Medicine

## 2013-05-27 NOTE — Telephone Encounter (Signed)
Rinaldo Cloudamela wants a call from [place].  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  She wants to know if Dr. Jonny RuizJohn can get them increased.  She was a little difficult to understand the details.  She says when she is tired she has trembles.  She has fibromyalgia and gets tired.  The person to call is Malachi BondsGloria at [phone].

## 2013-05-27 NOTE — Telephone Encounter (Signed)
Everlena Cooperalled Gloria at [phone] and she is faxing over a referral form for this patient in order to have her hours increased.  Will give to PCP to complete and needs to be faxed back to ParkerGloria when done.

## 2013-05-30 ENCOUNTER — Telehealth: Payer: Self-pay | Admitting: Internal Medicine

## 2013-05-30 MED ORDER — AZELASTINE HCL 0.1 % NA SOLN: 1.0000 | Freq: Two times a day (BID) | NASAL | Status: DC

## 2013-05-30 MED ORDER — CETIRIZINE HCL 10 MG PO TABS: 10.0000 mg | ORAL_TABLET | Freq: Every day | ORAL | Status: DC

## 2013-05-30 MED ORDER — FLUTICASONE PROPIONATE 50 MCG/ACT NA SUSP: 2.0000 | Freq: Every day | NASAL | Status: DC

## 2013-05-30 MED ORDER — FEXOFENADINE HCL 180 MG PO TABS: 180.0000 mg | ORAL_TABLET | Freq: Every day | ORAL | Status: DC

## 2013-05-30 NOTE — Telephone Encounter (Signed)
Pt wants something called in for allergies.  She is coughing.  She thinks the pollen is causing it.

## 2013-05-30 NOTE — Telephone Encounter (Signed)
Patient states that her insurance will not cover her fexofenadine (ALLEGRA) 180 MG tablet rx. She wants to know if an alternative can be sent in. Please advise.

## 2013-05-30 NOTE — Telephone Encounter (Signed)
Ok to try zytec prn

## 2013-05-30 NOTE — Telephone Encounter (Signed)
Ok for [employer], also called in refills for the astelin and flonase she had in the past

## 2013-05-30 NOTE — Telephone Encounter (Signed)
Patient informed. 

## 2013-06-02 ENCOUNTER — Telehealth: Payer: Self-pay | Admitting: Internal Medicine

## 2013-06-02 MED ORDER — CETIRIZINE HCL 10 MG PO TABS: 10.0000 mg | ORAL_TABLET | Freq: Every day | ORAL | Status: DC

## 2013-06-02 NOTE — Telephone Encounter (Signed)
Done to [facility] 

## 2013-06-02 NOTE — Telephone Encounter (Signed)
Pt would like to have an RX for the Zyrtec called in to her pharmacy.

## 2013-06-02 NOTE — Telephone Encounter (Signed)
Pt is aware.  

## 2013-06-04 ENCOUNTER — Telehealth: Payer: Self-pay | Admitting: Internal Medicine

## 2013-06-04 NOTE — Telephone Encounter (Signed)
Pt called.  She said she fell yesterday on her knee, but is ok.  A little swollen.  She says she went to the hospital and that they took her off of 2 medicines.  One was zyrtec.  The other she spelled canachflex 4 mg.  I'm not sure which it is, could not understand her very well.  She just wanted us to be aware.

## 2013-06-04 NOTE — Telephone Encounter (Signed)
Noted. Now off zyrtec and xanaflex

## 2013-06-06 ENCOUNTER — Telehealth: Payer: Self-pay | Admitting: Internal Medicine

## 2013-06-06 DIAGNOSIS — Z0279 Encounter for issue of other medical certificate: Secondary | ICD-10-CM

## 2013-06-06 NOTE — Telephone Encounter (Signed)
Called the patient informed of MD instructions.  She can't pay cash as bills have been high and has no cash.  Would need to go through her insurance (please send to [facility])

## 2013-06-06 NOTE — Telephone Encounter (Signed)
i dont know of any free antibiotics unless she does, and she would need some cash for any copay whether it was cash pay only or with insurance copay  Not sure how i can help further

## 2013-06-06 NOTE — Telephone Encounter (Signed)
Informed the patient of MD instructions and patient stated she would be ok to just take zyrtec at this time.

## 2013-06-06 NOTE — Telephone Encounter (Signed)
Ok to let pt know that the doxycycline is usually about $4 cash , and she deoesnt have to use her insurance

## 2013-06-06 NOTE — Telephone Encounter (Signed)
Pt wants an antibiotic for her sinus infection that her insurance will pay for.

## 2013-06-10 ENCOUNTER — Telehealth: Payer: Self-pay

## 2013-06-10 MED ORDER — PROMETHAZINE HCL 12.5 MG PO TABS: 12.5000 mg | ORAL_TABLET | Freq: Three times a day (TID) | ORAL | Status: DC | PRN

## 2013-06-10 NOTE — Telephone Encounter (Signed)
[facility] is requesting a refill for promethazine 12.5 Advise

## 2013-06-10 NOTE — Telephone Encounter (Signed)
Done erx 

## 2013-06-12 ENCOUNTER — Telehealth: Payer: Self-pay

## 2013-06-12 MED ORDER — MOMETASONE FUROATE 50 MCG/ACT NA SUSP: 2.0000 | Freq: Every day | NASAL | Status: DC

## 2013-06-12 NOTE — Telephone Encounter (Signed)
The patient called and is hoping to get her nasonex refilled.   Thanks!

## 2013-06-12 NOTE — Telephone Encounter (Signed)
Refill done.  

## 2013-06-17 ENCOUNTER — Telehealth: Payer: Self-pay | Admitting: Internal Medicine

## 2013-06-17 NOTE — Telephone Encounter (Signed)
Called the patient and she was talking about potassium and does know what it is for.  She would like to have a prescription of aspirin sent in to her pharmacy.  She would like to know how much to take as medication list is ASA 325 bid, advise on amount to take?

## 2013-06-17 NOTE — Telephone Encounter (Signed)
Chart indicates asa (enteric coated) 325 mg twice per day  No rx needed, as this is OTC

## 2013-06-17 NOTE — Telephone Encounter (Signed)
Pt wants to know what one of her medicines is for.  She spelled it potclmicrok meder.  She wants a call.

## 2013-06-18 NOTE — Telephone Encounter (Signed)
Patient informed. 

## 2013-06-24 ENCOUNTER — Telehealth: Payer: Self-pay | Admitting: Internal Medicine

## 2013-06-24 NOTE — Telephone Encounter (Signed)
Ok to not take robaxin (the "meth" med below) if she is not having any muscle pain, and ok to then disregard the letter  We can change to a different muscle relaxer if she likes

## 2013-06-24 NOTE — Telephone Encounter (Signed)
Pt called to say she got a letter from her insurance stating that they don't pay for Methocarban (pt's spelling).  She states she has not had any since 2014.

## 2013-06-24 NOTE — Telephone Encounter (Signed)
Informed of MD instructions on medication and she is ok for now.

## 2013-07-01 ENCOUNTER — Other Ambulatory Visit: Payer: Self-pay

## 2013-07-01 MED ORDER — AMLODIPINE BESYLATE 10 MG PO TABS: 5.0000 mg | ORAL_TABLET | Freq: Every morning | ORAL | Status: DC

## 2013-07-04 ENCOUNTER — Encounter: Payer: Self-pay | Admitting: Internal Medicine

## 2013-07-29 ENCOUNTER — Other Ambulatory Visit: Payer: Self-pay | Admitting: *Deleted

## 2013-07-29 MED ORDER — AMLODIPINE BESYLATE 10 MG PO TABS: 5.0000 mg | ORAL_TABLET | Freq: Every morning | ORAL | Status: DC

## 2013-07-29 NOTE — Telephone Encounter (Signed)
Left smg on triage needing her amlodipine [facility]. Called pt back no answer LMOM md sent request on 07/01/13, but will resend for 90 day...Raechel Chute/lmb

## 2013-08-08 ENCOUNTER — Other Ambulatory Visit: Payer: Self-pay | Admitting: *Deleted

## 2013-08-08 ENCOUNTER — Encounter: Payer: Self-pay | Admitting: Internal Medicine

## 2013-08-08 ENCOUNTER — Other Ambulatory Visit (INDEPENDENT_AMBULATORY_CARE_PROVIDER_SITE_OTHER): Payer: PRIVATE HEALTH INSURANCE

## 2013-08-08 ENCOUNTER — Ambulatory Visit (INDEPENDENT_AMBULATORY_CARE_PROVIDER_SITE_OTHER): Payer: PRIVATE HEALTH INSURANCE | Admitting: Internal Medicine

## 2013-08-08 VITALS — BP 142/90 | HR 77 | Temp 98.2°F | Ht 59.0 in | Wt 141.4 lb

## 2013-08-08 DIAGNOSIS — J309 Allergic rhinitis, unspecified: Secondary | ICD-10-CM

## 2013-08-08 DIAGNOSIS — I10 Essential (primary) hypertension: Secondary | ICD-10-CM

## 2013-08-08 DIAGNOSIS — E119 Type 2 diabetes mellitus without complications: Secondary | ICD-10-CM

## 2013-08-08 DIAGNOSIS — E785 Hyperlipidemia, unspecified: Secondary | ICD-10-CM

## 2013-08-08 LAB — HEPATIC FUNCTION PANEL
ALK PHOS: 80 U/L (ref 39–117)
ALT: 31 U/L (ref 0–35)
AST: 29 U/L (ref 0–37)
Albumin: 3.3 g/dL — ABNORMAL LOW (ref 3.5–5.2)
Bilirubin, Direct: 0.1 mg/dL (ref 0.0–0.3)
Total Bilirubin: 0.6 mg/dL (ref 0.2–1.2)
Total Protein: 6.5 g/dL (ref 6.0–8.3)

## 2013-08-08 LAB — MICROALBUMIN / CREATININE URINE RATIO
Creatinine,U: 84.8 mg/dL
Microalb Creat Ratio: 0.1 mg/g (ref 0.0–30.0)
Microalb, Ur: 0.1 mg/dL (ref 0.0–1.9)

## 2013-08-08 LAB — CBC WITH DIFFERENTIAL/PLATELET
BASOS PCT: 0.3 % (ref 0.0–3.0)
Basophils Absolute: 0 10*3/uL (ref 0.0–0.1)
EOS PCT: 1.7 % (ref 0.0–5.0)
Eosinophils Absolute: 0.1 10*3/uL (ref 0.0–0.7)
HEMATOCRIT: 36.8 % (ref 36.0–46.0)
Hemoglobin: 12.3 g/dL (ref 12.0–15.0)
Lymphocytes Relative: 28.8 % (ref 12.0–46.0)
Lymphs Abs: 2.2 10*3/uL (ref 0.7–4.0)
MCHC: 33.4 g/dL (ref 30.0–36.0)
MCV: 93.6 fl (ref 78.0–100.0)
MONO ABS: 0.7 10*3/uL (ref 0.1–1.0)
Monocytes Relative: 9 % (ref 3.0–12.0)
Neutro Abs: 4.6 10*3/uL (ref 1.4–7.7)
Neutrophils Relative %: 60.2 % (ref 43.0–77.0)
PLATELETS: 342 10*3/uL (ref 150.0–400.0)
RBC: 3.94 Mil/uL (ref 3.87–5.11)
RDW: 13.4 % (ref 11.5–15.5)
WBC: 7.7 10*3/uL (ref 4.0–10.5)

## 2013-08-08 LAB — BASIC METABOLIC PANEL
BUN: 21 mg/dL (ref 6–23)
CO2: 22 mEq/L (ref 19–32)
Calcium: 9 mg/dL (ref 8.4–10.5)
Chloride: 101 mEq/L (ref 96–112)
Creatinine, Ser: 0.6 mg/dL (ref 0.4–1.2)
GFR: 98.1 mL/min (ref >60.00)
Glucose, Bld: 97 mg/dL (ref 70–99)
POTASSIUM: 4.5 meq/L (ref 3.5–5.1)
SODIUM: 134 meq/L — AB (ref 135–145)

## 2013-08-08 LAB — URINALYSIS, ROUTINE W REFLEX MICROSCOPIC
HGB URINE DIPSTICK: NEGATIVE
KETONES UR: NEGATIVE
Nitrite: NEGATIVE
RBC / HPF: NONE SEEN (ref 0–?)
Specific Gravity, Urine: 1.02 (ref 1.000–1.030)
TOTAL PROTEIN, URINE-UPE24: NEGATIVE
URINE GLUCOSE: NEGATIVE
Urobilinogen, UA: 0.2 (ref 0.0–1.0)
pH: 6 (ref 5.0–8.0)

## 2013-08-08 LAB — TSH: TSH: 0.62 u[IU]/mL (ref 0.35–4.50)

## 2013-08-08 LAB — LIPID PANEL
CHOL/HDL RATIO: 2
Cholesterol: 153 mg/dL (ref 0–200)
HDL: 70 mg/dL (ref >39.00)
LDL Cholesterol: 65 mg/dL (ref 0–99)
NonHDL: 83
Triglycerides: 89 mg/dL (ref 0.0–149.0)
VLDL: 17.8 mg/dL (ref 0.0–40.0)

## 2013-08-08 LAB — HEMOGLOBIN A1C: HEMOGLOBIN A1C: 5.7 % (ref 4.6–6.5)

## 2013-08-08 MED ORDER — METHYLPREDNISOLONE ACETATE 80 MG/ML IJ SUSP
80.0000 mg | Freq: Once | INTRAMUSCULAR | Status: AC
  Administered 2013-08-08: 80 mg via INTRAMUSCULAR

## 2013-08-08 MED ORDER — GLUCOSE BLOOD VI STRP: ORAL_STRIP | Status: DC

## 2013-08-08 NOTE — Assessment & Plan Note (Signed)
stable overall by history and exam, recent data reviewed with pt, and pt to continue medical treatment as before,  to f/u any worsening symptoms or concerns BP Readings from Last 3 Encounters:  08/08/13 142/90  05/09/13 140/80  05/01/13 127/97

## 2013-08-08 NOTE — Patient Instructions (Addendum)
You had the steroid shot today  You are given the new glucometer today  Please continue all other medications as before, and refills have been done if requested.  Please have the pharmacy call with any other refills you may need.  Please continue your efforts at being more active, low cholesterol diet, and weight control.  You are otherwise up to date with prevention measures today.  Please keep your appointments with your specialists as you may have planned  Please go to the LAB in the Basement (turn left off the elevator) for the tests to be done today  You will be contacted by phone if any changes need to be made immediately.  Otherwise, you will receive a letter about your results with an explanation, but please check with MyChart first.  Please return in 6 months, or sooner if needed

## 2013-08-08 NOTE — Assessment & Plan Note (Signed)
Mild to mod, for depomedrol IM,  to f/u any worsening symptoms or concerns 

## 2013-08-08 NOTE — Assessment & Plan Note (Signed)
stable overall by history and exam, recent data reviewed with pt, and pt to continue medical treatment as before,  to f/u any worsening symptoms or concerns Lab Results  Component Value Date   LDLCALC 127* 04/30/2013   For lower chol diet, f/u labs

## 2013-08-08 NOTE — Progress Notes (Signed)
Pre visit review using our clinic review tool, if applicable. No additional management support is needed unless otherwise documented below in the visit note. 

## 2013-08-08 NOTE — Progress Notes (Signed)
   Subjective:    Patient ID: Katherine [name], female    DOB: 04/05/1945, 68 y.o.   MRN: [id number]  HPI  Here to f/u; overall doing ok,  Pt denies chest pain, increased sob or doe, wheezing, orthopnea, PND, increased LE swelling, palpitations, dizziness or syncope.  Pt denies polydipsia, polyuria, or low sugar symptoms such as weakness or confusion improved with po intake.  Pt denies new neurological symptoms such as new headache, or facial or extremity weakness or numbness.   Pt states overall good compliance with meds, has been trying to follow lower cholesterol, diabetic diet, with wt overall stable,  but little exercise however. CBG's in the lower 100's.  Also fell off porch with loose stool, uneven steps, with left ankle sprain, and right knee abrasion, now improved, has sen DrBlackman/ortho. Needs new meter for glc. Does have several wks ongoing nasal allergy symptoms with clearish congestion, itch and sneezing,without fever, cough  gReview of Systems  Constitutional: Negative for unusual diaphoresis or other sweats  HENT: Negative for ringing in ear Eyes: Negative for double vision or worsening visual disturbance.  Respiratory: Negative for choking and stridor.   Gastrointestinal: Negative for vomiting or other signifcant bowel change Genitourinary: Negative for hematuria or decreased urine volume.  Musculoskeletal: Negative for other MSK pain or swelling Skin: Negative for color change and worsening wound.  Neurological: Negative for tremors and numbness other than noted  Psychiatric/Behavioral: Negative for decreased concentration or agitation other than above       Objective:   Physical Exam BP 142/90  Pulse 77  Temp(Src) 98.2 F (36.8 C) (Oral)  Ht 4\' 11"  (1.499 m)  Wt 141 lb 6 oz (64.127 kg)  BMI 28.54 kg/m2  SpO2 95% VS noted,  Constitutional: Pt appears well-developed, well-nourished.  HENT: Head: NCAT.  Right Ear: External ear normal.  Left Ear: External ear normal.    Bilat tm's with mild erythema.  Max sinus areas non tender.  Pharynx with mild erythema, no exudate Eyes: . Pupils are equal, round, and reactive to light. Conjunctivae and EOM are normal Neck: Normal range of motion. Neck supple.  Cardiovascular: Normal rate and regular rhythm.   Pulmonary/Chest: Effort normal and breath sounds normal.  Abd:  Soft, NT, ND, + BS Neurological: Pt is alert. Not confused , motor grossly intact Skin: Skin is warm. No rash Psychiatric: Pt behavior is normal. No agitation.     Assessment & Plan:

## 2013-08-08 NOTE — Addendum Note (Signed)
Addended by: Scharlene GlossEWING, ROBIN B on: 08/08/2013 02:59 PM   Modules accepted: Orders

## 2013-08-08 NOTE — Telephone Encounter (Signed)
Left msg on triage stating she was given a one touch verio bs monitor needing testing strips...Raechel Chute/lmb

## 2013-08-08 NOTE — Assessment & Plan Note (Signed)
stable overall by history and exam, recent data reviewed with pt, and pt to continue medical treatment as before,  to f/u any worsening symptoms or concerns Lab Results  Component Value Date   HGBA1C 5.8* 04/30/2013

## 2013-08-08 NOTE — Addendum Note (Signed)
Addended by: [name], [name] on: 08/08/2013 02:55 PM   Modules accepted: Orders

## 2013-08-11 ENCOUNTER — Telehealth: Payer: Self-pay | Admitting: Internal Medicine

## 2013-08-11 MED ORDER — ONETOUCH LANCETS MISC: Status: DC

## 2013-08-11 NOTE — Telephone Encounter (Signed)
Pt request one touch lancet to be call in [facility]. Please call pt

## 2013-08-11 NOTE — Telephone Encounter (Signed)
Refilled lancets as requested, call the patient to inform refill done.

## 2013-08-12 ENCOUNTER — Telehealth: Payer: Self-pay | Admitting: *Deleted

## 2013-08-12 NOTE — Telephone Encounter (Signed)
Pt states she has spoken with her insurance comp & they are concern about the Cogentin. Med can cause hallucinations & problems with her other medicine. Requesting md to cut med down or rx something else...Raechel Chute/lmb

## 2013-08-12 NOTE — Telephone Encounter (Signed)
no

## 2013-08-12 NOTE — Telephone Encounter (Signed)
Notified pt with md response.../lmb 

## 2013-08-14 ENCOUNTER — Ambulatory Visit: Payer: PRIVATE HEALTH INSURANCE | Admitting: Infectious Disease

## 2013-08-18 ENCOUNTER — Telehealth: Payer: Self-pay | Admitting: *Deleted

## 2013-08-18 ENCOUNTER — Telehealth: Payer: Self-pay | Admitting: Internal Medicine

## 2013-08-18 MED ORDER — ATORVASTATIN CALCIUM 40 MG PO TABS: 40.0000 mg | ORAL_TABLET | Freq: Every morning | ORAL | Status: DC

## 2013-08-18 MED ORDER — GLUCOSE BLOOD VI STRP: ORAL_STRIP | Status: DC

## 2013-08-18 NOTE — Telephone Encounter (Signed)
Patient would like a call in regards to reason for lipitor

## 2013-08-18 NOTE — Telephone Encounter (Signed)
Pt called back she uses [facility]. [facility]...Raechel Chute/lmb

## 2013-08-18 NOTE — Telephone Encounter (Signed)
MD [facility] on 08/08/13 for one touch verio strips. Called pt to verify what pharmacy. No answer LMOM RTC...Raechel Chute/lmb

## 2013-08-18 NOTE — Telephone Encounter (Signed)
[facility] Triage Call Report Triage Record Num: [id number] Operator: Morrie SheldonAshley Moger Patient Name: Noel Journeyamela Sonn Call Date & Time: 08/14/2013 6:00:34PM Patient Phone: PCP: Oliver BarreJames John Patient Gender: Female PCP Fax : [phone] Patient DOB: 01/27/1946 [facility] Name[facility] Reason for Call: Caller: Joshlynn/Patient; PCP: Oliver BarreJohn, James (Adults only); CB#: [phone]; Call regarding Needs diabetic strips - one touch verio; Pt needs rx for glucose test strips called into pharmacy so they can be mailed to her. Triaged per "Medication Questions - Adult" guideline. Provided Health Information; answered yes to "Caller has medication question that was answered with available resources." [facility]. Protocol(s) Used: Medication Questions - Adult Recommended Outcome per Protocol: Provided Health Information Reason for Outcome: Caller has medication question(s) that was answered with available resources Care Advice: ~ 08/14/2013 6:19:25PM Page 1 of 1 [email or web address]

## 2013-08-18 NOTE — Telephone Encounter (Signed)
Called the patient back .  Refilled her atorvastatin and test strips to [facility].

## 2013-08-18 NOTE — Telephone Encounter (Signed)
Patient needs test strips vario OneTouch called in to pharmacy

## 2013-08-20 ENCOUNTER — Encounter: Payer: Self-pay | Admitting: Infectious Disease

## 2013-08-20 ENCOUNTER — Ambulatory Visit (INDEPENDENT_AMBULATORY_CARE_PROVIDER_SITE_OTHER): Payer: PRIVATE HEALTH INSURANCE | Admitting: Infectious Disease

## 2013-08-20 VITALS — BP 120/81 | HR 96 | Temp 98.1°F | Wt 141.0 lb

## 2013-08-20 DIAGNOSIS — Y92009 Unspecified place in unspecified non-institutional (private) residence as the place of occurrence of the external cause: Secondary | ICD-10-CM

## 2013-08-20 DIAGNOSIS — T8131XS Disruption of external operation (surgical) wound, not elsewhere classified, sequela: Secondary | ICD-10-CM

## 2013-08-20 DIAGNOSIS — A4902 Methicillin resistant Staphylococcus aureus infection, unspecified site: Secondary | ICD-10-CM

## 2013-08-20 DIAGNOSIS — Z96659 Presence of unspecified artificial knee joint: Secondary | ICD-10-CM

## 2013-08-20 DIAGNOSIS — T8454XA Infection and inflammatory reaction due to internal left knee prosthesis, initial encounter: Secondary | ICD-10-CM

## 2013-08-20 DIAGNOSIS — T8450XA Infection and inflammatory reaction due to unspecified internal joint prosthesis, initial encounter: Secondary | ICD-10-CM

## 2013-08-20 DIAGNOSIS — W19XXXA Unspecified fall, initial encounter: Secondary | ICD-10-CM

## 2013-08-20 DIAGNOSIS — T889XXS Complication of surgical and medical care, unspecified, sequela: Secondary | ICD-10-CM

## 2013-08-20 LAB — COMPLETE METABOLIC PANEL WITH GFR
ALT: 22 U/L (ref 0–35)
AST: 24 U/L (ref 0–37)
Albumin: 3.4 g/dL — ABNORMAL LOW (ref 3.5–5.2)
Alkaline Phosphatase: 92 U/L (ref 39–117)
BUN: 16 mg/dL (ref 6–23)
CALCIUM: 8.9 mg/dL (ref 8.4–10.5)
CO2: 28 meq/L (ref 19–32)
CREATININE: 0.68 mg/dL (ref 0.50–1.10)
Chloride: 103 mEq/L (ref 96–112)
GFR, Est Non African American: >89 mL/min
GLUCOSE: 95 mg/dL (ref 70–99)
Potassium: 3.9 mEq/L (ref 3.5–5.3)
Sodium: 140 mEq/L (ref 135–145)
Total Bilirubin: 0.4 mg/dL (ref 0.2–1.2)
Total Protein: 5.7 g/dL — ABNORMAL LOW (ref 6.0–8.3)

## 2013-08-20 LAB — SEDIMENTATION RATE: Sed Rate: 4 mm/hr (ref 0–22)

## 2013-08-20 LAB — CBC WITH DIFFERENTIAL/PLATELET
Basophils Absolute: 0 10*3/uL (ref 0.0–0.1)
Basophils Relative: 0 % (ref 0–1)
EOS PCT: 3 % (ref 0–5)
Eosinophils Absolute: 0.2 10*3/uL (ref 0.0–0.7)
HEMATOCRIT: 37.7 % (ref 36.0–46.0)
Hemoglobin: 12.3 g/dL (ref 12.0–15.0)
Lymphocytes Relative: 29 % (ref 12–46)
Lymphs Abs: 1.5 10*3/uL (ref 0.7–4.0)
MCH: 30.1 pg (ref 26.0–34.0)
MCHC: 32.6 g/dL (ref 30.0–36.0)
MCV: 92.4 fL (ref 78.0–100.0)
MONO ABS: 0.5 10*3/uL (ref 0.1–1.0)
MONOS PCT: 9 % (ref 3–12)
Neutro Abs: 3.1 10*3/uL (ref 1.7–7.7)
Neutrophils Relative %: 59 % (ref 43–77)
Platelets: 302 10*3/uL (ref 150–400)
RBC: 4.08 MIL/uL (ref 3.87–5.11)
RDW: 13.2 % (ref 11.5–15.5)
WBC: 5.3 10*3/uL (ref 4.0–10.5)

## 2013-08-20 LAB — C-REACTIVE PROTEIN: CRP: <0.5 mg/dL (ref <0.60)
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
## 2013-08-26 ENCOUNTER — Other Ambulatory Visit: Payer: Self-pay

## 2013-08-26 MED ORDER — IPRATROPIUM BROMIDE 0.06 % NA SOLN: 1.0000 | Freq: Three times a day (TID) | NASAL | Status: DC

## 2013-08-28 ENCOUNTER — Telehealth: Payer: Self-pay | Admitting: Internal Medicine

## 2013-08-28 NOTE — Telephone Encounter (Signed)
Pt call stated that [facility] need our office to send in what all her medication is for. Please help.

## 2013-08-28 NOTE — Telephone Encounter (Signed)
Called the patient back and she stated for all future refills if we could put on the instructions what the medication is to be used for this would help her to know what she is taking.  The pharmacy informed her the PCP's office would need to include the instructions on her prescriptions as they were not allowed to do so.  Informed we would do so with all her future refills.

## 2013-09-05 ENCOUNTER — Telehealth: Payer: Self-pay | Admitting: Internal Medicine

## 2013-09-05 NOTE — Telephone Encounter (Signed)
No, ok to stop the flonase, only the nasonex is needed, thanks  Zella BallRobin to adjust med list as well

## 2013-09-05 NOTE — Telephone Encounter (Signed)
Pharmacist at [facility] informed of MD instructions.

## 2013-09-05 NOTE — Telephone Encounter (Signed)
Katie with [facility] pharmacy called in regards to the patient's prescriptions mometasone (NASONEX) 50 MCG/ACT nasal spray and fluticasone (FLONASE) 50 MCG/ACT nasal spray. Pharmacy states that she has been receiving these for a while but they need to know if she is supposed to be taking both. Please call pharmacy to advise.

## 2013-09-17 ENCOUNTER — Telehealth: Payer: Self-pay | Admitting: *Deleted

## 2013-09-17 MED ORDER — ATORVASTATIN CALCIUM 40 MG PO TABS
40.0000 mg | ORAL_TABLET | Freq: Every morning | ORAL | Status: DC
Start: 2013-09-17 — End: 2013-11-04

## 2013-09-17 MED ORDER — POTASSIUM CHLORIDE CRYS ER 20 MEQ PO TBCR: 20.0000 meq | EXTENDED_RELEASE_TABLET | Freq: Two times a day (BID) | ORAL | Status: DC

## 2013-09-17 MED ORDER — OMEPRAZOLE 20 MG PO CPDR: 20.0000 mg | DELAYED_RELEASE_CAPSULE | Freq: Every day | ORAL | Status: DC

## 2013-09-17 MED ORDER — AMLODIPINE BESYLATE 10 MG PO TABS: 5.0000 mg | ORAL_TABLET | Freq: Every morning | ORAL | Status: DC

## 2013-09-17 NOTE — Telephone Encounter (Signed)
Left msg on triage stating wanting md to call pharmacy and let them know what all her medicine is use for.../lmb  Called pt no answer LMOM sent rx's with diagnosis attach...Katherine [name]/lbm

## 2013-09-30 NOTE — Telephone Encounter (Signed)
Pt is aware.  

## 2013-10-01 ENCOUNTER — Other Ambulatory Visit: Payer: Self-pay | Admitting: Internal Medicine

## 2013-10-03 ENCOUNTER — Other Ambulatory Visit: Payer: Self-pay

## 2013-10-03 DIAGNOSIS — Z1231 Encounter for screening mammogram for malignant neoplasm of breast: Secondary | ICD-10-CM

## 2013-10-09 ENCOUNTER — Other Ambulatory Visit: Payer: Self-pay | Admitting: Internal Medicine

## 2013-10-09 DIAGNOSIS — L84 Corns and callosities: Secondary | ICD-10-CM | POA: Insufficient documentation

## 2013-10-09 NOTE — Telephone Encounter (Signed)
Info recevied today per insurance exam show foot callous  OK to refer to podiatry

## 2013-10-23 ENCOUNTER — Telehealth: Payer: Self-pay | Admitting: Internal Medicine

## 2013-10-23 MED ORDER — TRIAMCINOLONE ACETONIDE 55 MCG/ACT NA AERO: 2.0000 | INHALATION_SPRAY | Freq: Every day | NASAL | Status: DC

## 2013-10-23 NOTE — Telephone Encounter (Signed)
Ok for change to nasacort - done erx 

## 2013-10-23 NOTE — Telephone Encounter (Signed)
Patient informed. 

## 2013-10-23 NOTE — Telephone Encounter (Signed)
Patient states pharmacy does not carry nasonex anymore.  She needs something else sent in [facility].

## 2013-10-23 NOTE — Telephone Encounter (Signed)
Patient informed she has been put on Burpirone 10 mg and Rifampin 300 mg.  She wanted to make sure ok to take along with all other medications she takes.

## 2013-10-23 NOTE — Telephone Encounter (Signed)
Yes, this would be fine, thanks 

## 2013-10-28 ENCOUNTER — Telehealth: Payer: Self-pay | Admitting: *Deleted

## 2013-10-28 MED ORDER — BUDESONIDE 32 MCG/ACT NA SUSP
2.0000 | Freq: Every day | NASAL | Status: DC
Start: 2013-10-28 — End: 2014-07-14

## 2013-10-28 NOTE — Telephone Encounter (Signed)
Left msg on triage md sent rx in for Nasacort AQ med is not available. Requesting md to change to something else...Raechel Chute/lmb

## 2013-10-28 NOTE — Telephone Encounter (Signed)
Ok for try change to rhinocort aq - done erx

## 2013-10-29 ENCOUNTER — Telehealth: Payer: Self-pay | Admitting: Internal Medicine

## 2013-10-29 MED ORDER — OMEPRAZOLE 20 MG PO CPDR: 20.0000 mg | DELAYED_RELEASE_CAPSULE | Freq: Every day | ORAL | Status: DC

## 2013-10-29 NOTE — Telephone Encounter (Signed)
Prescription requested has been sent in.

## 2013-10-29 NOTE — Telephone Encounter (Signed)
Pt called requesting a refill on Omeprazole (Prilosec) 20 mg cap.  Pt states she was in rehab and has been released and is no longer getting meds from that doctor.  Pt is to see Dr. Jonny RuizJohn for a visit in January 2016.

## 2013-10-29 NOTE — Telephone Encounter (Signed)
Patient informed of change, left detailed message.

## 2013-10-29 NOTE — Telephone Encounter (Signed)
Pt is requesting a refill on Om

## 2013-10-31 ENCOUNTER — Encounter: Payer: Self-pay | Admitting: Podiatry

## 2013-10-31 ENCOUNTER — Ambulatory Visit (INDEPENDENT_AMBULATORY_CARE_PROVIDER_SITE_OTHER): Payer: PRIVATE HEALTH INSURANCE

## 2013-10-31 ENCOUNTER — Ambulatory Visit (INDEPENDENT_AMBULATORY_CARE_PROVIDER_SITE_OTHER): Payer: PRIVATE HEALTH INSURANCE | Admitting: Podiatry

## 2013-10-31 VITALS — BP 155/85 | HR 73 | Resp 17

## 2013-10-31 DIAGNOSIS — B351 Tinea unguium: Secondary | ICD-10-CM

## 2013-10-31 DIAGNOSIS — M79609 Pain in unspecified limb: Secondary | ICD-10-CM

## 2013-10-31 DIAGNOSIS — R52 Pain, unspecified: Secondary | ICD-10-CM

## 2013-10-31 DIAGNOSIS — M2010 Hallux valgus (acquired), unspecified foot: Secondary | ICD-10-CM

## 2013-10-31 DIAGNOSIS — S90121A Contusion of right lesser toe(s) without damage to nail, initial encounter: Secondary | ICD-10-CM

## 2013-10-31 DIAGNOSIS — M898X9 Other specified disorders of bone, unspecified site: Secondary | ICD-10-CM

## 2013-10-31 DIAGNOSIS — M2040 Other hammer toe(s) (acquired), unspecified foot: Secondary | ICD-10-CM

## 2013-10-31 DIAGNOSIS — Q828 Other specified congenital malformations of skin: Secondary | ICD-10-CM
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
## 2013-10-31 NOTE — Patient Instructions (Signed)
Diabetes and Foot Care Diabetes may cause you to have problems because of poor blood supply (circulation) to your feet and legs. This may cause the skin on your feet to become thinner, break easier, and heal more slowly. Your skin may become dry, and the skin may peel and crack. You may also have nerve damage in your legs and feet causing decreased feeling in them. You may not notice minor injuries to your feet that could lead to infections or more serious problems. Taking care of your feet is one of the most important things you can do for yourself.  HOME CARE INSTRUCTIONS  Wear shoes at all times, even in the house. Do not go barefoot. Bare feet are easily injured.  Check your feet daily for blisters, cuts, and redness. If you cannot see the bottom of your feet, use a mirror or ask someone for help.  Wash your feet with warm water (do not use hot water) and mild soap. Then pat your feet and the areas between your toes until they are completely dry. Do not soak your feet as this can dry your skin.  Apply a moisturizing lotion or petroleum jelly (that does not contain alcohol and is unscented) to the skin on your feet and to dry, brittle toenails. Do not apply lotion between your toes.  Trim your toenails straight across. Do not dig under them or around the cuticle. File the edges of your nails with an emery board or nail file.  Do not cut corns or calluses or try to remove them with medicine.  Wear clean socks or stockings every day. Make sure they are not too tight. Do not wear knee-high stockings since they may decrease blood flow to your legs.  Wear shoes that fit properly and have enough cushioning. To break in new shoes, wear them for just a few hours a day. This prevents you from injuring your feet. Always look in your shoes before you put them on to be sure there are no objects inside.  Do not cross your legs. This may decrease the blood flow to your feet.  If you find a minor scrape,  cut, or break in the skin on your feet, keep it and the skin around it clean and dry. These areas may be cleansed with mild soap and water. Do not cleanse the area with peroxide, alcohol, or iodine.  When you remove an adhesive bandage, be sure not to damage the skin around it.  If you have a wound, look at it several times a day to make sure it is healing.  Do not use heating pads or hot water bottles. They may burn your skin. If you have lost feeling in your feet or legs, you may not know it is happening until it is too late.  Make sure your health care provider performs a complete foot exam at least annually or more often if you have foot problems. Report any cuts, sores, or bruises to your health care provider immediately. SEEK MEDICAL CARE IF:   You have an injury that is not healing.  You have cuts or breaks in the skin.  You have an ingrown nail.  You notice redness on your legs or feet.  You feel burning or tingling in your legs or feet.  You have pain or cramps in your legs and feet.  Your legs or feet are numb.  Your feet always feel cold. SEEK IMMEDIATE MEDICAL CARE IF:   There is increasing redness,   swelling, or pain in or around a wound.  There is a red line that goes up your leg.  Pus is coming from a wound.  You develop a fever or as directed by your health care provider.  You notice a bad smell coming from an ulcer or wound. Document Released: 01/14/2000 Document Revised: 09/18/2012 Document Reviewed: 06/25/2012 ExitCare Patient Information 2015 [employer]. This information is not intended to replace advice given to you by your health care provider. Make sure you discuss any questions you have with your health care provider.  

## 2013-11-03 ENCOUNTER — Telehealth: Payer: Self-pay | Admitting: Internal Medicine

## 2013-11-03 NOTE — Telephone Encounter (Signed)
Patient is requesting to change from lipitor to something else.  Patient states its upping her blood sugar.

## 2013-11-04 MED ORDER — ATORVASTATIN CALCIUM 40 MG PO TABS: 40.0000 mg | ORAL_TABLET | Freq: Every morning | ORAL | Status: DC

## 2013-11-04 NOTE — Telephone Encounter (Signed)
If the sugar is elevated, it is very unlikely to be due to the lipitor, as the effect of this is so small, it is not normally considered a reason to stop the lipitor  OK to cont lipitor  Please consider OV for sugar if continues elevated

## 2013-11-04 NOTE — Telephone Encounter (Signed)
Patient informed of MD Instructions and agreed to continue on lipitor.

## 2013-11-05 ENCOUNTER — Telehealth: Payer: Self-pay

## 2013-11-05 MED ORDER — TIZANIDINE HCL 4 MG PO TABS: 4.0000 mg | ORAL_TABLET | Freq: Four times a day (QID) | ORAL | Status: DC | PRN

## 2013-11-05 NOTE — Telephone Encounter (Signed)
Done erx 

## 2013-11-05 NOTE — Telephone Encounter (Signed)
Received fax request for Tizanidine but if not on current list.  Advise on refill please.

## 2013-11-11 ENCOUNTER — Other Ambulatory Visit: Payer: Self-pay

## 2013-11-11 MED ORDER — AZELASTINE HCL 0.1 % NA SOLN: 1.0000 | Freq: Two times a day (BID) | NASAL | Status: DC

## 2013-11-12 ENCOUNTER — Telehealth: Payer: Self-pay | Admitting: Internal Medicine

## 2013-11-12 NOTE — Telephone Encounter (Signed)
Authorize her nose spray

## 2013-11-13 NOTE — Telephone Encounter (Signed)
Received Approval for Budesonide 32 mcg nasal suspension through 01/30/2015. Patient has been informed and pharmacy.  Faxed approval letter to [facility].

## 2013-11-14 DIAGNOSIS — Z0279 Encounter for issue of other medical certificate: Secondary | ICD-10-CM

## 2013-11-17 ENCOUNTER — Other Ambulatory Visit: Payer: Self-pay

## 2013-11-17 MED ORDER — ALBUTEROL SULFATE HFA 108 (90 BASE) MCG/ACT IN AERS: 2.0000 | INHALATION_SPRAY | Freq: Four times a day (QID) | RESPIRATORY_TRACT | Status: DC | PRN

## 2013-11-19 ENCOUNTER — Ambulatory Visit: Payer: PRIVATE HEALTH INSURANCE

## 2013-11-20 ENCOUNTER — Ambulatory Visit: Payer: PRIVATE HEALTH INSURANCE | Admitting: Infectious Disease

## 2013-11-24 ENCOUNTER — Ambulatory Visit: Payer: PRIVATE HEALTH INSURANCE | Admitting: Infectious Disease

## 2013-11-26 ENCOUNTER — Telehealth: Payer: Self-pay | Admitting: Internal Medicine

## 2013-11-26 ENCOUNTER — Ambulatory Visit: Payer: PRIVATE HEALTH INSURANCE

## 2013-11-26 NOTE — Telephone Encounter (Signed)
They will refax Change of status forms to our fax number, they did receive forms faxed on 11/13/13, but not the change of status form.

## 2013-11-26 NOTE — Telephone Encounter (Signed)
Drenda FreezeFran with [facility] faxed a Change of Status for Personal Care Services form four times.  The last time she faxed it was on 11/20/13.  She would like to know the status of having that form filled out and faxed back to her at [phone]. Please advise.

## 2013-12-01 DIAGNOSIS — Z0279 Encounter for issue of other medical certificate: Secondary | ICD-10-CM

## 2013-12-04 ENCOUNTER — Telehealth: Payer: Self-pay | Admitting: Internal Medicine

## 2013-12-04 NOTE — Telephone Encounter (Signed)
A physical appointment was made for the patient on 03/18/14 at 1:00pm

## 2013-12-10 ENCOUNTER — Telehealth: Payer: Self-pay

## 2013-12-10 MED ORDER — ONDANSETRON HCL 4 MG PO TABS: 4.0000 mg | ORAL_TABLET | Freq: Three times a day (TID) | ORAL | Status: DC | PRN

## 2013-12-10 NOTE — Telephone Encounter (Signed)
Fax from pharmacy states patient has prescription for promethazine.  List of alternatives that are covered on the 2016 drug list. Levocetirizine and odansetron.

## 2013-12-10 NOTE — Telephone Encounter (Signed)
Change done erx 

## 2013-12-11 ENCOUNTER — Encounter: Payer: Self-pay | Admitting: Internal Medicine

## 2013-12-11 ENCOUNTER — Ambulatory Visit (INDEPENDENT_AMBULATORY_CARE_PROVIDER_SITE_OTHER): Payer: PRIVATE HEALTH INSURANCE | Admitting: Internal Medicine

## 2013-12-11 ENCOUNTER — Other Ambulatory Visit (INDEPENDENT_AMBULATORY_CARE_PROVIDER_SITE_OTHER): Payer: PRIVATE HEALTH INSURANCE

## 2013-12-11 VITALS — BP 120/84 | HR 67 | Temp 98.4°F | Ht <= 58 in | Wt 135.0 lb

## 2013-12-11 DIAGNOSIS — E785 Hyperlipidemia, unspecified: Secondary | ICD-10-CM

## 2013-12-11 DIAGNOSIS — E119 Type 2 diabetes mellitus without complications: Secondary | ICD-10-CM

## 2013-12-11 DIAGNOSIS — Z23 Encounter for immunization: Secondary | ICD-10-CM

## 2013-12-11 DIAGNOSIS — I10 Essential (primary) hypertension: Secondary | ICD-10-CM

## 2013-12-11 DIAGNOSIS — K219 Gastro-esophageal reflux disease without esophagitis: Secondary | ICD-10-CM

## 2013-12-11 LAB — HEPATIC FUNCTION PANEL
ALBUMIN: 3.2 g/dL — AB (ref 3.5–5.2)
ALT: 29 U/L (ref 0–35)
AST: 28 U/L (ref 0–37)
Alkaline Phosphatase: 99 U/L (ref 39–117)
Bilirubin, Direct: 0.1 mg/dL (ref 0.0–0.3)
TOTAL PROTEIN: 6.2 g/dL (ref 6.0–8.3)
Total Bilirubin: 0.4 mg/dL (ref 0.2–1.2)

## 2013-12-11 LAB — CBC WITH DIFFERENTIAL/PLATELET
BASOS ABS: 0 10*3/uL (ref 0.0–0.1)
BASOS PCT: 0.4 % (ref 0.0–3.0)
Eosinophils Absolute: 0.2 10*3/uL (ref 0.0–0.7)
Eosinophils Relative: 2.5 % (ref 0.0–5.0)
HCT: 36.8 % (ref 36.0–46.0)
HEMOGLOBIN: 12.3 g/dL (ref 12.0–15.0)
Lymphocytes Relative: 29.6 % (ref 12.0–46.0)
Lymphs Abs: 2.3 10*3/uL (ref 0.7–4.0)
MCHC: 33.3 g/dL (ref 30.0–36.0)
MCV: 93.3 fl (ref 78.0–100.0)
MONOS PCT: 8 % (ref 3.0–12.0)
Monocytes Absolute: 0.6 10*3/uL (ref 0.1–1.0)
NEUTROS ABS: 4.7 10*3/uL (ref 1.4–7.7)
NEUTROS PCT: 59.5 % (ref 43.0–77.0)
Platelets: 264 10*3/uL (ref 150.0–400.0)
RBC: 3.94 Mil/uL (ref 3.87–5.11)
RDW: 13.6 % (ref 11.5–15.5)
WBC: 7.9 10*3/uL (ref 4.0–10.5)

## 2013-12-11 LAB — URINALYSIS, ROUTINE W REFLEX MICROSCOPIC
Hgb urine dipstick: NEGATIVE
Ketones, ur: NEGATIVE
Nitrite: NEGATIVE
Specific Gravity, Urine: 1.01 (ref 1.000–1.030)
Total Protein, Urine: NEGATIVE
URINE GLUCOSE: NEGATIVE
Urobilinogen, UA: 0.2 (ref 0.0–1.0)
pH: 7 (ref 5.0–8.0)

## 2013-12-11 LAB — TSH: TSH: 0.57 u[IU]/mL (ref 0.35–4.50)

## 2013-12-11 LAB — BASIC METABOLIC PANEL
BUN: 21 mg/dL (ref 6–23)
CALCIUM: 9.2 mg/dL (ref 8.4–10.5)
CO2: 22 mEq/L (ref 19–32)
Chloride: 104 mEq/L (ref 96–112)
Creatinine, Ser: 0.7 mg/dL (ref 0.4–1.2)
GFR: 94.58 mL/min (ref >60.00)
GLUCOSE: 101 mg/dL — AB (ref 70–99)
Potassium: 4.2 mEq/L (ref 3.5–5.1)
Sodium: 136 mEq/L (ref 135–145)

## 2013-12-11 LAB — MICROALBUMIN / CREATININE URINE RATIO
Creatinine,U: 36.7 mg/dL
Microalb Creat Ratio: 1.1 mg/g (ref 0.0–30.0)
Microalb, Ur: 0.4 mg/dL (ref 0.0–1.9)

## 2013-12-11 LAB — LIPID PANEL
Cholesterol: 153 mg/dL (ref 0–200)
HDL: 60 mg/dL (ref >39.00)
LDL Cholesterol: 71 mg/dL (ref 0–99)
NonHDL: 93
TRIGLYCERIDES: 112 mg/dL (ref 0.0–149.0)
Total CHOL/HDL Ratio: 3
VLDL: 22.4 mg/dL (ref 0.0–40.0)

## 2013-12-11 LAB — HEMOGLOBIN A1C: HEMOGLOBIN A1C: 5.9 % (ref 4.6–6.5)

## 2013-12-11 MED ORDER — ATORVASTATIN CALCIUM 40 MG PO TABS: 40.0000 mg | ORAL_TABLET | Freq: Every morning | ORAL | Status: DC

## 2013-12-11 MED ORDER — BUDESONIDE 32 MCG/ACT NA SUSP: 1.0000 | Freq: Every day | NASAL | Status: DC

## 2013-12-11 MED ORDER — PANTOPRAZOLE SODIUM 40 MG PO TBEC: 40.0000 mg | DELAYED_RELEASE_TABLET | Freq: Every day | ORAL | Status: DC

## 2013-12-11 MED ORDER — HYDROXYZINE HCL 10 MG PO TABS: 10.0000 mg | ORAL_TABLET | Freq: Two times a day (BID) | ORAL | Status: DC | PRN

## 2013-12-11 MED ORDER — ONETOUCH LANCETS MISC: Status: DC

## 2013-12-11 MED ORDER — BUSPIRONE HCL 10 MG PO TABS: 10.0000 mg | ORAL_TABLET | Freq: Three times a day (TID) | ORAL | Status: DC

## 2013-12-11 NOTE — Assessment & Plan Note (Signed)
stable overall by history and exam, recent data reviewed with pt, and pt to continue medical treatment as before,  to f/u any worsening symptoms or concerns Lab Results  Component Value Date   LDLCALC 65 08/08/2013   For f/u lab

## 2013-12-11 NOTE — Progress Notes (Signed)
Subjective:    Patient ID: Katherine [name], female    DOB: Jun 06, 1945, 68 y.o.   MRN: [id number]  HPI  Here to f/u; overall doing ok,  Pt denies chest pain, increased sob or doe, wheezing, orthopnea, PND, increased LE swelling, palpitations, dizziness or syncope.  Pt denies polydipsia, polyuria, or low sugar symptoms such as weakness or confusion improved with po intake.  Pt denies new neurological symptoms such as new headache, or facial or extremity weakness or numbness.   Pt states overall good compliance with meds, has been trying to follow lower cholesterol diet, with wt overall stable,  but little exercise however.  Has new atarax and buspar.  Still needs state nurse aide service, needed Face to Face today.  No significant changes overall - still needs aide for support. No new complaints except for recent worsening reflux despite trying to watch her diet.  Past Medical History  Diagnosis Date  . ABDOMINAL PAIN, LOWER 02/04/2010  . ABNORMAL THYROID FUNCTION TESTS 08/04/2009  . ALLERGIC RHINITIS 09/05/2006  . ANXIETY 07/31/2007  . DEPRESSION 09/05/2006  . DIVERTICULOSIS, COLON 09/05/2006  . Dysuria 04/15/2010  . Failure to thrive in childhood 04/06/2010  . FATIGUE 04/15/2010  . FIBROMYALGIA 05/10/2009  . FRACTURE, PELVIS, RIGHT 04/15/2010  . GASTROENTERITIS, ACUTE 12/25/2008  . GERD 09/05/2006  . HYPERLIPIDEMIA 09/05/2006  . HYPERTENSION 09/05/2006  . KNEE PAIN, LEFT 02/04/2010  . OSTEOARTHRITIS 09/05/2006  . OSTEOPOROSIS 07/31/2007  . Other specified forms of hearing loss 11/01/2009  . SINUSITIS- ACUTE-NOS 10/09/2007  . SKIN LESION 05/10/2009  . SPRAIN&STRAIN OTH SPEC SITES SHOULDER&UPPER ARM 11/01/2009  . Unspecified hearing loss 10/09/2007  . URI 02/20/2007  . UTI 02/20/2007  . Blood transfusion   . Cataract     resolved -surgery done  . Heart murmur   . DIABETES MELLITUS, TYPE II 07/31/2007    no meds   Past Surgical History  Procedure Laterality Date  . Tonsillectomy    . Breast biopsy    . Abdominal  hysterectomy  12/08    Bladder tac, partial hysterectomy  . Inguinal herniorrhapy      right Dr. Freida Busman 06/2009  . Catarct removal      both eyes  . Total knee arthroplasty Left 12/20/2012    Procedure: LEFT TOTAL KNEE ARTHROPLASTY;  Surgeon: Kathryne Hitch, MD;  Location: [facility];  Service: Orthopedics;  Laterality: Left;  . I&d knee with poly exchange Left 01/03/2013    Procedure: IRRIGATION AND DEBRIDEMENT LEFT KNEE WITH POLY EXCHANGE;  Surgeon: Kathryne Hitch, MD;  Location: [facility];  Service: Orthopedics;  Laterality: Left;    reports that she has never smoked. She does not have any smokeless tobacco history on file. She reports that she does not drink alcohol or use illicit drugs. family history includes Coronary artery disease in her other; Diabetes in her other. There is no history of Colon cancer, Esophageal cancer, Stomach cancer, or Rectal cancer. Allergies  Allergen Reactions  . Penicillins     "made me nervous"  . Thioridazine Hcl Itching   Current Outpatient Prescriptions on File Prior to Visit  Medication Sig Dispense Refill  . albuterol (PROVENTIL HFA;VENTOLIN HFA) 108 (90 BASE) MCG/ACT inhaler Inhale 2 puffs into the lungs every 6 (six) hours as needed for wheezing or shortness of breath. 8.5 g 5  . amLODipine (NORVASC) 10 MG tablet Take 0.5 tablets (5 mg total) by mouth every morning. 90 tablet 3  . aspirin EC 325 MG EC  tablet Take 1 tablet (325 mg total) by mouth 2 (two) times daily after a meal. 45 tablet 0  . azelastine (ASTELIN) 0.1 % nasal spray Place 1 spray into both nostrils 2 (two) times daily. Use in each nostril as directed 30 mL 11  . benztropine (COGENTIN) 0.5 MG tablet Take 0.5 mg by mouth 2 (two) times daily.      . budesonide (RHINOCORT AQUA) 32 MCG/ACT nasal spray Place 2 sprays into both nostrils daily. 8.6 g 5  . calcium-vitamin D (OSCAL WITH D) 500-200 MG-UNIT per tablet Take 1 tablet by mouth 3 (three) times daily.    . cetirizine (ZYRTEC)  10 MG tablet Take 1 tablet (10 mg total) by mouth daily. 30 tablet 11  . doxycycline (VIBRA-TABS) 100 MG tablet Take 1 tablet (100 mg total) by mouth 2 (two) times daily. 60 tablet 11  . etodolac (LODINE) 400 MG tablet Take 400 mg by mouth 2 (two) times daily.    [name]. glucose blood (ONETOUCH VERIO) test strip Use to check blood sugar twice a day Dx 250.00 180 each 3  . ipratropium (ATROVENT) 0.06 % nasal spray Place 1 spray into both nostrils 3 (three) times daily. 15 mL 11  . mometasone (NASONEX) 50 MCG/ACT nasal spray Place 2 sprays into the nose daily. 17 g 11  . Multiple Vitamin (MULTIVITAMIN WITH MINERALS) TABS tablet Take 1 tablet by mouth daily.    [name]. omeprazole (PRILOSEC) 20 MG capsule Take 1 capsule (20 mg total) by mouth daily. 90 capsule 3  . ondansetron (ZOFRAN) 4 MG tablet Take 1 tablet (4 mg total) by mouth every 8 (eight) hours as needed for nausea or vomiting. 40 tablet 1  . potassium chloride SA (K-DUR,KLOR-CON) 20 MEQ tablet Take 1 tablet (20 mEq total) by mouth 2 (two) times daily. 180 tablet 3  . pregabalin (LYRICA) 75 MG capsule Take 1 capsule (75 mg total) by mouth 2 (two) times daily. 60 capsule 5  . promethazine (PHENERGAN) 12.5 MG tablet TAKE 1 TABLET BY MOUTH EVERY 8 HOURS AS NEEDED FOR NAUSEA AND VOMITING 40 tablet 0  . rifampin (RIFADIN) 300 MG capsule Take 1 capsule (300 mg total) by mouth every 12 (twelve) hours. 60 capsule 11  . risperiDONE (RISPERDAL) 3 MG tablet Take 3 mg by mouth at bedtime.     [name]. tiZANidine (ZANAFLEX) 4 MG tablet Take 1 tablet (4 mg total) by mouth every 6 (six) hours as needed for muscle spasms. 40 tablet 1   No current facility-administered medications on file prior to visit.   Review of Systems  Constitutional: Negative for unusual diaphoresis or other sweats  HENT: Negative for ringing in ear Eyes: Negative for double vision or worsening visual disturbance.  Respiratory: Negative for choking and stridor.   Gastrointestinal: Negative for  vomiting or other signifcant bowel change Genitourinary: Negative for hematuria or decreased urine volume.  Musculoskeletal: Negative for other MSK pain or swelling Skin: Negative for color change and worsening wound.  Neurological: Negative for tremors and numbness other than noted  Psychiatric/Behavioral: Negative for decreased concentration or agitation other than above       Objective:   Physical Exam BP 120/84 mmHg  Pulse 67  Temp(Src) 98.4 F (36.9 C) (Oral)  Ht 4\' 10"  (1.473 m)  Wt 135 lb (61.236 kg)  BMI 28.22 kg/m2  SpO2 92% N/A  VS noted,  Constitutional: Pt appears well-developed, well-nourished.  HENT: Head: NCAT.  Right Ear: External ear normal.  Left Ear: External  ear normal.  Eyes: . Pupils are equal, round, and reactive to light. Conjunctivae and EOM are normal Neck: Normal range of motion. Neck supple.  Cardiovascular: Normal rate and regular rhythm.   Pulmonary/Chest: Effort normal and breath sounds normal.  Abd:  Soft, NT, ND, + BS Neurological: Pt is alert. not confused , motor grossly intact Skin: Skin is warm. No rash Psychiatric: Pt behavior is normal. No agitation.     Assessment & Plan:

## 2013-12-11 NOTE — Progress Notes (Signed)
Pre visit review using our clinic review tool, if applicable. No additional management support is needed unless otherwise documented below in the visit note. 

## 2013-12-11 NOTE — Assessment & Plan Note (Signed)
stable overall by history and exam, recent data reviewed with pt, and pt to continue medical treatment as before,  to f/u any worsening symptoms or concerns BP Readings from Last 3 Encounters:  12/11/13 120/84  10/31/13 155/85  08/20/13 120/81

## 2013-12-11 NOTE — Patient Instructions (Addendum)
Your new Lipitor refill was sent to [facility], as well as faxed the discount card number  OK to stop the prilosec (omeprazole)  Please take all new medication as prescribed - the protonix 40 mg per day  Please continue all other medications as before, and refills have been done if requested.  Please have the pharmacy call with any other refills you may need.  Please continue your efforts at being more active, low cholesterol diet, and weight control.  You are otherwise up to date with prevention measures today.  Please keep your appointments with your specialists as you may have planned  Please go to the LAB in the Basement (turn left off the elevator) for the tests to be done today  You will be contacted by phone if any changes need to be made immediately.  Otherwise, you will receive a letter about your results with an explanation, but please check with MyChart first.  Please remember to sign up for MyChart if you have not done so, as this will be important to you in the future with finding out test results, communicating by private email, and scheduling acute appointments online when needed.  Please return in 6 months, or sooner if needed

## 2013-12-11 NOTE — Assessment & Plan Note (Signed)
With recent freq breaqthrough, for change PPI to protonix 40 qd

## 2013-12-11 NOTE — Assessment & Plan Note (Signed)
stable overall by history and exam, recent data reviewed with pt, and pt to continue medical treatment as before,  to f/u any worsening symptoms or concerns Lab Results  Component Value Date   HGBA1C 5.7 08/08/2013   For f/u lab

## 2013-12-16 ENCOUNTER — Ambulatory Visit: Payer: PRIVATE HEALTH INSURANCE

## 2013-12-22 ENCOUNTER — Encounter: Payer: Self-pay | Admitting: Infectious Disease

## 2013-12-22 ENCOUNTER — Ambulatory Visit (INDEPENDENT_AMBULATORY_CARE_PROVIDER_SITE_OTHER): Payer: PRIVATE HEALTH INSURANCE | Admitting: Infectious Disease

## 2013-12-22 VITALS — BP 137/84 | HR 75 | Temp 99.0°F | Wt 141.0 lb

## 2013-12-22 DIAGNOSIS — Z96659 Presence of unspecified artificial knee joint: Principal | ICD-10-CM

## 2013-12-22 DIAGNOSIS — E11621 Type 2 diabetes mellitus with foot ulcer: Secondary | ICD-10-CM

## 2013-12-22 DIAGNOSIS — A4902 Methicillin resistant Staphylococcus aureus infection, unspecified site: Secondary | ICD-10-CM

## 2013-12-22 DIAGNOSIS — T8450XD Infection and inflammatory reaction due to unspecified internal joint prosthesis, subsequent encounter: Secondary | ICD-10-CM

## 2013-12-22 DIAGNOSIS — F05 Delirium due to known physiological condition: Secondary | ICD-10-CM

## 2013-12-22 DIAGNOSIS — T8459XD Infection and inflammatory reaction due to other internal joint prosthesis, subsequent encounter: Secondary | ICD-10-CM

## 2013-12-22 DIAGNOSIS — R410 Disorientation, unspecified: Secondary | ICD-10-CM

## 2013-12-22 DIAGNOSIS — L97519 Non-pressure chronic ulcer of other part of right foot with unspecified severity: Secondary | ICD-10-CM

## 2013-12-22 LAB — BASIC METABOLIC PANEL WITH GFR
BUN: 23 mg/dL (ref 6–23)
CHLORIDE: 104 meq/L (ref 96–112)
CO2: 26 mEq/L (ref 19–32)
CREATININE: 0.85 mg/dL (ref 0.50–1.10)
Calcium: 9.1 mg/dL (ref 8.4–10.5)
GFR, EST NON AFRICAN AMERICAN: 71 mL/min
GFR, Est African American: 81 mL/min
Glucose, Bld: 88 mg/dL (ref 70–99)
POTASSIUM: 4.5 meq/L (ref 3.5–5.3)
Sodium: 139 mEq/L (ref 135–145)

## 2013-12-22 LAB — C-REACTIVE PROTEIN: CRP: <0.5 mg/dL (ref <0.60)
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
## 2013-12-23 ENCOUNTER — Other Ambulatory Visit: Payer: Self-pay

## 2013-12-23 LAB — SEDIMENTATION RATE: SED RATE: 1 mm/h (ref 0–22)

## 2013-12-23 MED ORDER — CALCIUM CARBONATE-VITAMIN D 500-200 MG-UNIT PO TABS: 1.0000 | ORAL_TABLET | Freq: Three times a day (TID) | ORAL | Status: DC

## 2013-12-23 MED ORDER — TIZANIDINE HCL 4 MG PO TABS: 4.0000 mg | ORAL_TABLET | Freq: Four times a day (QID) | ORAL | Status: DC | PRN

## 2013-12-23 NOTE — Addendum Note (Signed)
Addended by: Scharlene GlossEWING, Dezaria Methot B on: 12/23/2013 12:14 PM   Modules accepted: Orders

## 2014-01-21 ENCOUNTER — Other Ambulatory Visit: Payer: Self-pay

## 2014-01-21 MED ORDER — ONDANSETRON HCL 4 MG PO TABS: 4.0000 mg | ORAL_TABLET | Freq: Three times a day (TID) | ORAL | Status: DC | PRN

## 2014-02-03 ENCOUNTER — Ambulatory Visit
Admission: RE | Admit: 2014-02-03 | Discharge: 2014-02-03 | Disposition: A | Discharge status: Home / Self Care | Payer: 59 | Source: Ambulatory Visit

## 2014-02-03 DIAGNOSIS — Z1231 Encounter for screening mammogram for malignant neoplasm of breast: Secondary | ICD-10-CM | POA: Diagnosis not present

## 2014-02-06 ENCOUNTER — Ambulatory Visit (INDEPENDENT_AMBULATORY_CARE_PROVIDER_SITE_OTHER): Payer: Medicare Other | Admitting: Podiatry

## 2014-02-06 DIAGNOSIS — Q828 Other specified congenital malformations of skin: Secondary | ICD-10-CM

## 2014-02-06 DIAGNOSIS — B351 Tinea unguium: Secondary | ICD-10-CM | POA: Diagnosis not present

## 2014-02-06 DIAGNOSIS — M79609 Pain in unspecified limb: Principal | ICD-10-CM

## 2014-02-06 NOTE — Patient Instructions (Signed)
Diabetes and Foot Care Diabetes may cause you to have problems because of poor blood supply (circulation) to your feet and legs. This may cause the skin on your feet to become thinner, break easier, and heal more slowly. Your skin may become dry, and the skin may peel and crack. You may also have nerve damage in your legs and feet causing decreased feeling in them. You may not notice minor injuries to your feet that could lead to infections or more serious problems. Taking care of your feet is one of the most important things you can do for yourself.  HOME CARE INSTRUCTIONS  Wear shoes at all times, even in the house. Do not go barefoot. Bare feet are easily injured.  Check your feet daily for blisters, cuts, and redness. If you cannot see the bottom of your feet, use a mirror or ask someone for help.  Wash your feet with warm water (do not use hot water) and mild soap. Then pat your feet and the areas between your toes until they are completely dry. Do not soak your feet as this can dry your skin.  Apply a moisturizing lotion or petroleum jelly (that does not contain alcohol and is unscented) to the skin on your feet and to dry, brittle toenails. Do not apply lotion between your toes.  Trim your toenails straight across. Do not dig under them or around the cuticle. File the edges of your nails with an emery board or nail file.  Do not cut corns or calluses or try to remove them with medicine.  Wear clean socks or stockings every day. Make sure they are not too tight. Do not wear knee-high stockings since they may decrease blood flow to your legs.  Wear shoes that fit properly and have enough cushioning. To break in new shoes, wear them for just a few hours a day. This prevents you from injuring your feet. Always look in your shoes before you put them on to be sure there are no objects inside.  Do not cross your legs. This may decrease the blood flow to your feet.  If you find a minor scrape,  cut, or break in the skin on your feet, keep it and the skin around it clean and dry. These areas may be cleansed with mild soap and water. Do not cleanse the area with peroxide, alcohol, or iodine.  When you remove an adhesive bandage, be sure not to damage the skin around it.  If you have a wound, look at it several times a day to make sure it is healing.  Do not use heating pads or hot water bottles. They may burn your skin. If you have lost feeling in your feet or legs, you may not know it is happening until it is too late.  Make sure your health care provider performs a complete foot exam at least annually or more often if you have foot problems. Report any cuts, sores, or bruises to your health care provider immediately. SEEK MEDICAL CARE IF:   You have an injury that is not healing.  You have cuts or breaks in the skin.  You have an ingrown nail.  You notice redness on your legs or feet.  You feel burning or tingling in your legs or feet.  You have pain or cramps in your legs and feet.  Your legs or feet are numb.  Your feet always feel cold. SEEK IMMEDIATE MEDICAL CARE IF:   There is increasing redness,   swelling, or pain in or around a wound.  There is a red line that goes up your leg.  Pus is coming from a wound.  You develop a fever or as directed by your health care provider.  You notice a bad smell coming from an ulcer or wound. Document Released: 01/14/2000 Document Revised: 09/18/2012 Document Reviewed: 06/25/2012 ExitCare Patient Information 2015 [employer]. This information is not intended to replace advice given to you by your health care provider. Make sure you discuss any questions you have with your health care provider.  

## 2014-02-09 NOTE — Progress Notes (Signed)
Patient ID: Katherine [name], female   DOB: 11/29/1945, 69 y.o.   MRN: [id number]  Subjective: [name] presents the office today for diabetic risk assessment as well as painful elongated nails. She states the nails are painful, especially with shoe gear and pressure to all of the nails. Denies any redness or drainage from the nail sites. Denies any ulceration or intermittent claudication symptoms.   No other complaints at this time.  Objective: AAO x3, NAD DP/PT pulses palpable b/l. CRT < 3 sec Protective sensation intact with Simms Weinstein monofilament, vibratory sensation intact, Achilles tendon reflex intact. Degenerative changes noticeable with prominent exostosis of the right midfoot. No tenderness over the area. Bilateral HAV and hammertoe deformities. MMT 5/5, ROM WNL Nails hypertrophic, dystrophic, elongated, discolored, brittle x 10. No surrounding erythema or drainage from around the nail sites.  Right submetatarsal 1 hyperkeratotic lesion. No other lesions identified. No open lesions. No calf pain, swelling, warmth, erythema.   Assessment: 69 year old female symptomatically onychomycosis, hyperkerotic lesion right submetatarsal 1  Plan: -X-rays were obtained and reviewed the patient. -Treatment options discussed including alternatives, risks, complications. -Nail sharply debrided without complications/bleeding x 10.  -Hyperkeratotic lesion sharply debrided without complication/bleeding.  -Discussed the importance of proper shoe gear to help protect her foot type.  -Followup in 3 months or sooner if any problems are to arise or any changes symptoms. In the meantime call the office with any questions, concerns.

## 2014-02-10 ENCOUNTER — Ambulatory Visit: Payer: PRIVATE HEALTH INSURANCE | Admitting: Internal Medicine

## 2014-02-19 ENCOUNTER — Telehealth: Payer: Self-pay | Admitting: *Deleted

## 2014-02-19 NOTE — Telephone Encounter (Signed)
Patient called office requesting we mail her an Rx for Diabetic shoes and inserts from [facility] to her home.   Spoke with Dr Ardelle AntonWagoner and he wrote the Rx for Diabetic shoes and 3 pairs of inserts to be provided by [facility].  Written Rx mailed to the patients home and copy sent to be scanned into the chart.

## 2014-03-18 ENCOUNTER — Encounter: Payer: PRIVATE HEALTH INSURANCE | Admitting: Internal Medicine

## 2014-03-22 DIAGNOSIS — S72019A Unspecified intracapsular fracture of unspecified femur, initial encounter for closed fracture: Secondary | ICD-10-CM | POA: Diagnosis not present

## 2014-03-23 DIAGNOSIS — S72019A Unspecified intracapsular fracture of unspecified femur, initial encounter for closed fracture: Secondary | ICD-10-CM | POA: Diagnosis not present

## 2014-03-24 DIAGNOSIS — S72019A Unspecified intracapsular fracture of unspecified femur, initial encounter for closed fracture: Secondary | ICD-10-CM | POA: Diagnosis not present

## 2014-03-25 ENCOUNTER — Ambulatory Visit: Payer: PRIVATE HEALTH INSURANCE | Admitting: Infectious Disease

## 2014-03-25 DIAGNOSIS — S72019A Unspecified intracapsular fracture of unspecified femur, initial encounter for closed fracture: Secondary | ICD-10-CM | POA: Diagnosis not present

## 2014-03-26 DIAGNOSIS — S72019A Unspecified intracapsular fracture of unspecified femur, initial encounter for closed fracture: Secondary | ICD-10-CM | POA: Diagnosis not present

## 2014-03-27 DIAGNOSIS — S72019A Unspecified intracapsular fracture of unspecified femur, initial encounter for closed fracture: Secondary | ICD-10-CM | POA: Diagnosis not present

## 2014-03-28 DIAGNOSIS — S72019A Unspecified intracapsular fracture of unspecified femur, initial encounter for closed fracture: Secondary | ICD-10-CM | POA: Diagnosis not present

## 2014-03-29 DIAGNOSIS — S72019A Unspecified intracapsular fracture of unspecified femur, initial encounter for closed fracture: Secondary | ICD-10-CM | POA: Diagnosis not present

## 2014-03-30 ENCOUNTER — Encounter: Payer: Self-pay | Admitting: Infectious Disease

## 2014-03-30 ENCOUNTER — Telehealth: Payer: Self-pay | Admitting: *Deleted

## 2014-03-30 ENCOUNTER — Other Ambulatory Visit: Payer: Self-pay | Admitting: *Deleted

## 2014-03-30 ENCOUNTER — Ambulatory Visit (INDEPENDENT_AMBULATORY_CARE_PROVIDER_SITE_OTHER): Payer: Medicare Other | Admitting: Infectious Disease

## 2014-03-30 VITALS — BP 127/85 | HR 71 | Temp 97.5°F | Wt 160.8 lb

## 2014-03-30 DIAGNOSIS — T8450XD Infection and inflammatory reaction due to unspecified internal joint prosthesis, subsequent encounter: Secondary | ICD-10-CM

## 2014-03-30 DIAGNOSIS — L84 Corns and callosities: Secondary | ICD-10-CM | POA: Diagnosis not present

## 2014-03-30 DIAGNOSIS — S72019A Unspecified intracapsular fracture of unspecified femur, initial encounter for closed fracture: Secondary | ICD-10-CM | POA: Diagnosis not present

## 2014-03-30 DIAGNOSIS — T8459XD Infection and inflammatory reaction due to other internal joint prosthesis, subsequent encounter: Secondary | ICD-10-CM

## 2014-03-30 DIAGNOSIS — Z96659 Presence of unspecified artificial knee joint: Principal | ICD-10-CM

## 2014-03-30 MED ORDER — ONDANSETRON HCL 4 MG PO TABS: 4.0000 mg | ORAL_TABLET | Freq: Three times a day (TID) | ORAL | Status: DC | PRN
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
## 2014-03-30 NOTE — Telephone Encounter (Signed)
Pt request the name and phone number called to her.  Called [facility]  [phone] to pt.

## 2014-03-31 DIAGNOSIS — S72019A Unspecified intracapsular fracture of unspecified femur, initial encounter for closed fracture: Secondary | ICD-10-CM | POA: Diagnosis not present

## 2014-04-01 DIAGNOSIS — S72019A Unspecified intracapsular fracture of unspecified femur, initial encounter for closed fracture: Secondary | ICD-10-CM | POA: Diagnosis not present

## 2014-04-02 DIAGNOSIS — S72019A Unspecified intracapsular fracture of unspecified femur, initial encounter for closed fracture: Secondary | ICD-10-CM | POA: Diagnosis not present

## 2014-04-03 DIAGNOSIS — S72019A Unspecified intracapsular fracture of unspecified femur, initial encounter for closed fracture: Secondary | ICD-10-CM | POA: Diagnosis not present

## 2014-04-04 DIAGNOSIS — S72019A Unspecified intracapsular fracture of unspecified femur, initial encounter for closed fracture: Secondary | ICD-10-CM | POA: Diagnosis not present

## 2014-04-05 DIAGNOSIS — S72019A Unspecified intracapsular fracture of unspecified femur, initial encounter for closed fracture: Secondary | ICD-10-CM | POA: Diagnosis not present

## 2014-04-06 DIAGNOSIS — S72019A Unspecified intracapsular fracture of unspecified femur, initial encounter for closed fracture: Secondary | ICD-10-CM | POA: Diagnosis not present

## 2014-04-07 DIAGNOSIS — S72019A Unspecified intracapsular fracture of unspecified femur, initial encounter for closed fracture: Secondary | ICD-10-CM | POA: Diagnosis not present

## 2014-04-08 ENCOUNTER — Encounter: Payer: Self-pay | Admitting: Podiatry

## 2014-04-08 ENCOUNTER — Ambulatory Visit (INDEPENDENT_AMBULATORY_CARE_PROVIDER_SITE_OTHER): Payer: Medicare Other | Admitting: Podiatry

## 2014-04-08 VITALS — BP 108/43 | HR 61 | Resp 18

## 2014-04-08 DIAGNOSIS — S72019A Unspecified intracapsular fracture of unspecified femur, initial encounter for closed fracture: Secondary | ICD-10-CM | POA: Diagnosis not present

## 2014-04-08 DIAGNOSIS — M79609 Pain in unspecified limb: Secondary | ICD-10-CM

## 2014-04-08 DIAGNOSIS — B351 Tinea unguium: Secondary | ICD-10-CM | POA: Diagnosis not present

## 2014-04-08 NOTE — Progress Notes (Signed)
Patient ID: Katherine QueenPamela A [name], female   DOB: 11/06/1945, 69 y.o.   MRN: [id number]  Subjective: 69 year old female presents the office they for ulcer check right foot submetatarsal one which started after last appointment. She states that she did have a small opening in the skin or she was applying some "ointment" to the wound, and it has since healed. She denies any redness around the area streaking. Denies any drainage or purulence. She states that she previously had a callus over this area for which she was using callus remover pads home. Since there is no longer a callus she has stopped. She previously had diabetic shoes/inserts made but they are greater than 69 years old. No other complaints at this time. Denies any systemic complaints as fevers, chills, nausea, vomiting.  Objective: AAO 3, NAD Protective sensation intact with Simms Weinstein monofilament, vibratory sensation intact, Achilles tendon reflex intact. On the right foot submetatarsal one there is evidence of a pre-ulcerative lesion. There is no specific hyperkeratotic tissue buildup how the area is red from pressure. There is no opening the skin and there is no ascending cellulitis. No areas of fluctuance or crepitus. There is no drainage. Plantarflexed first metatarsal hallux malleus on the right foot. There is also a flexed first metatarsal the left but not as significant as the right. There is no other areas of open lesions or pre-ulcer lesions identified bilaterally. No other areas of tenderness to bilateral lower x-rays. No overlying edema, erythema, increased warmth. Nails are elongated, dystrophic, hypertrophic, brittle, discolored 10. There is objective tenderness on nails 1-5 bilaterally. No surrounding erythema or drainage. No pain with calf compression, swelling, warmth, erythema.  Assessment: 69 year old female with pre-ulcerative lesion right submetatarsal 1  Plan: -Treatment options were discussed the patient can  alternatives, risks, complications. -At this time there is no open lesion there is no Smith again buildup of hyperkeratotic tissue. Discussed the patient closely monitor the area on a daily basis and call the office immediately should there be any changes. -I do believe the patient benefit from new diabetic shoes and inserts to help alleviate pressure. She is awaiting approval from her primary care physician for these. -Nails sharply debrided 10 without complication/bleeding. -Follow-up as scheduled. In the meantime occurs call the office with any questions, concerns, change in symptoms.

## 2014-04-09 DIAGNOSIS — S72019A Unspecified intracapsular fracture of unspecified femur, initial encounter for closed fracture: Secondary | ICD-10-CM | POA: Diagnosis not present

## 2014-04-10 DIAGNOSIS — S72019A Unspecified intracapsular fracture of unspecified femur, initial encounter for closed fracture: Secondary | ICD-10-CM | POA: Diagnosis not present

## 2014-04-11 DIAGNOSIS — S72019A Unspecified intracapsular fracture of unspecified femur, initial encounter for closed fracture: Secondary | ICD-10-CM | POA: Diagnosis not present

## 2014-04-12 DIAGNOSIS — S72019A Unspecified intracapsular fracture of unspecified femur, initial encounter for closed fracture: Secondary | ICD-10-CM | POA: Diagnosis not present

## 2014-04-13 DIAGNOSIS — S72019A Unspecified intracapsular fracture of unspecified femur, initial encounter for closed fracture: Secondary | ICD-10-CM | POA: Diagnosis not present

## 2014-04-14 ENCOUNTER — Telehealth: Payer: Self-pay | Admitting: Internal Medicine

## 2014-04-14 DIAGNOSIS — S72019A Unspecified intracapsular fracture of unspecified femur, initial encounter for closed fracture: Secondary | ICD-10-CM | POA: Diagnosis not present

## 2014-04-14 NOTE — Telephone Encounter (Signed)
Pharmacy called stating pts lancets and test strips have different directions. Pharmacy needs correct SIG to fill RX. Please advise

## 2014-04-15 DIAGNOSIS — S72019A Unspecified intracapsular fracture of unspecified femur, initial encounter for closed fracture: Secondary | ICD-10-CM | POA: Diagnosis not present

## 2014-04-15 NOTE — Telephone Encounter (Signed)
Please ask pt how often she normally checks her sugars, then adjust the prescriptions accordingly, thanks

## 2014-04-16 DIAGNOSIS — S72019A Unspecified intracapsular fracture of unspecified femur, initial encounter for closed fracture: Secondary | ICD-10-CM | POA: Diagnosis not present

## 2014-04-16 NOTE — Telephone Encounter (Signed)
Discussed this with the pharmacy yesterday.

## 2014-04-17 DIAGNOSIS — S72019A Unspecified intracapsular fracture of unspecified femur, initial encounter for closed fracture: Secondary | ICD-10-CM | POA: Diagnosis not present

## 2014-04-18 DIAGNOSIS — S72019A Unspecified intracapsular fracture of unspecified femur, initial encounter for closed fracture: Secondary | ICD-10-CM | POA: Diagnosis not present

## 2014-04-19 DIAGNOSIS — S72019A Unspecified intracapsular fracture of unspecified femur, initial encounter for closed fracture: Secondary | ICD-10-CM | POA: Diagnosis not present

## 2014-04-20 DIAGNOSIS — S72019A Unspecified intracapsular fracture of unspecified femur, initial encounter for closed fracture: Secondary | ICD-10-CM | POA: Diagnosis not present

## 2014-04-21 DIAGNOSIS — S72019A Unspecified intracapsular fracture of unspecified femur, initial encounter for closed fracture: Secondary | ICD-10-CM | POA: Diagnosis not present

## 2014-04-22 DIAGNOSIS — S72019A Unspecified intracapsular fracture of unspecified femur, initial encounter for closed fracture: Secondary | ICD-10-CM | POA: Diagnosis not present

## 2014-04-23 DIAGNOSIS — S72019A Unspecified intracapsular fracture of unspecified femur, initial encounter for closed fracture: Secondary | ICD-10-CM | POA: Diagnosis not present

## 2014-04-24 DIAGNOSIS — S72019A Unspecified intracapsular fracture of unspecified femur, initial encounter for closed fracture: Secondary | ICD-10-CM | POA: Diagnosis not present

## 2014-04-25 DIAGNOSIS — S72019A Unspecified intracapsular fracture of unspecified femur, initial encounter for closed fracture: Secondary | ICD-10-CM | POA: Diagnosis not present

## 2014-04-26 DIAGNOSIS — S72019A Unspecified intracapsular fracture of unspecified femur, initial encounter for closed fracture: Secondary | ICD-10-CM | POA: Diagnosis not present

## 2014-04-27 DIAGNOSIS — S72019A Unspecified intracapsular fracture of unspecified femur, initial encounter for closed fracture: Secondary | ICD-10-CM | POA: Diagnosis not present

## 2014-04-28 DIAGNOSIS — S72019A Unspecified intracapsular fracture of unspecified femur, initial encounter for closed fracture: Secondary | ICD-10-CM | POA: Diagnosis not present

## 2014-04-29 DIAGNOSIS — S72019A Unspecified intracapsular fracture of unspecified femur, initial encounter for closed fracture: Secondary | ICD-10-CM | POA: Diagnosis not present

## 2014-04-30 DIAGNOSIS — S72019A Unspecified intracapsular fracture of unspecified femur, initial encounter for closed fracture: Secondary | ICD-10-CM | POA: Diagnosis not present

## 2014-05-01 DIAGNOSIS — S72019A Unspecified intracapsular fracture of unspecified femur, initial encounter for closed fracture: Secondary | ICD-10-CM | POA: Diagnosis not present

## 2014-05-02 DIAGNOSIS — S72019A Unspecified intracapsular fracture of unspecified femur, initial encounter for closed fracture: Secondary | ICD-10-CM | POA: Diagnosis not present

## 2014-05-03 DIAGNOSIS — S72019A Unspecified intracapsular fracture of unspecified femur, initial encounter for closed fracture: Secondary | ICD-10-CM | POA: Diagnosis not present

## 2014-05-04 DIAGNOSIS — S72019A Unspecified intracapsular fracture of unspecified femur, initial encounter for closed fracture: Secondary | ICD-10-CM | POA: Diagnosis not present

## 2014-05-05 DIAGNOSIS — S72019A Unspecified intracapsular fracture of unspecified femur, initial encounter for closed fracture: Secondary | ICD-10-CM | POA: Diagnosis not present

## 2014-05-06 DIAGNOSIS — S72019A Unspecified intracapsular fracture of unspecified femur, initial encounter for closed fracture: Secondary | ICD-10-CM | POA: Diagnosis not present

## 2014-05-07 DIAGNOSIS — S72019A Unspecified intracapsular fracture of unspecified femur, initial encounter for closed fracture: Secondary | ICD-10-CM | POA: Diagnosis not present

## 2014-05-08 ENCOUNTER — Encounter: Payer: Self-pay | Admitting: Podiatry

## 2014-05-08 ENCOUNTER — Ambulatory Visit (INDEPENDENT_AMBULATORY_CARE_PROVIDER_SITE_OTHER): Payer: Medicare Other | Admitting: Podiatry

## 2014-05-08 VITALS — BP 136/64 | HR 72 | Resp 18

## 2014-05-08 DIAGNOSIS — S72019A Unspecified intracapsular fracture of unspecified femur, initial encounter for closed fracture: Secondary | ICD-10-CM | POA: Diagnosis not present

## 2014-05-08 DIAGNOSIS — B351 Tinea unguium: Secondary | ICD-10-CM | POA: Diagnosis not present

## 2014-05-08 DIAGNOSIS — M79609 Pain in unspecified limb: Secondary | ICD-10-CM

## 2014-05-09 DIAGNOSIS — S72019A Unspecified intracapsular fracture of unspecified femur, initial encounter for closed fracture: Secondary | ICD-10-CM | POA: Diagnosis not present

## 2014-05-10 DIAGNOSIS — S72019A Unspecified intracapsular fracture of unspecified femur, initial encounter for closed fracture: Secondary | ICD-10-CM | POA: Diagnosis not present

## 2014-05-11 DIAGNOSIS — S72019A Unspecified intracapsular fracture of unspecified femur, initial encounter for closed fracture: Secondary | ICD-10-CM | POA: Diagnosis not present

## 2014-05-11 NOTE — Progress Notes (Signed)
Patient ID: Katherine QueenPamela A [name], female   DOB: 07/13/1945, 69 y.o.   MRN: [id number]  Subjective: 69 y.o.-year-old female returns the office today for painful, elongated, thickened toenails for which she cannot cut herself. Denies any redness or drainage around the nails. Denies any acute changes since last appointment and no new complaints today. Denies any systemic complaints such as fevers, chills, nausea, vomiting.   Objective: AAO 3, NAD DP/PT pulses palpable, CRT less than 3 seconds Protective sensation intact with Simms Weinstein monofilament, Achilles tendon reflex intact.  Nails hypertrophic, dystrophic, elongated, brittle, discolored 10. There is tenderness overlying the nails 1-5 bilaterally. There is no surrounding erythema or drainage along the nail sites. Preoperative lesion is present along right submetatarsal 1. Currently there is no breaks in the skin.  No open lesions or  other pre-ulcerative lesions are identified. No other areas of tenderness bilateral lower extremities. No overlying edema, erythema, increased warmth. No pain with calf compression, swelling, warmth, erythema.  Assessment: Patient presents with symptomatic onychomycosis; right some metatarsal 1 pre-ulcerative lesion  Plan: -Treatment options including alternatives, risks, complications were discussed -Nails sharply debrided 10 without complication/bleeding. -Discussed daily foot inspection. If there are any changes, to call the office immediately.  -Awaiting approval for diabetic shoes. -Follow-up in 3 months or sooner if any problems are to arise. In the meantime, encouraged to call the office with any questions, concerns, changes symptoms.

## 2014-05-12 DIAGNOSIS — S72019A Unspecified intracapsular fracture of unspecified femur, initial encounter for closed fracture: Secondary | ICD-10-CM | POA: Diagnosis not present

## 2014-05-13 ENCOUNTER — Telehealth: Payer: Self-pay | Admitting: Internal Medicine

## 2014-05-13 DIAGNOSIS — S72019A Unspecified intracapsular fracture of unspecified femur, initial encounter for closed fracture: Secondary | ICD-10-CM | POA: Diagnosis not present

## 2014-05-13 MED ORDER — ONDANSETRON HCL 4 MG PO TABS: 4.0000 mg | ORAL_TABLET | Freq: Three times a day (TID) | ORAL | Status: DC | PRN

## 2014-05-13 NOTE — Telephone Encounter (Signed)
Done erx 

## 2014-05-13 NOTE — Telephone Encounter (Signed)
Patient notified that RX has sent in.

## 2014-05-13 NOTE — Telephone Encounter (Signed)
Pt request refill for ondansetron (ZOFRAN) 4 MG tablet to be send to [facility] on [address]. Please help

## 2014-05-14 DIAGNOSIS — S72019A Unspecified intracapsular fracture of unspecified femur, initial encounter for closed fracture: Secondary | ICD-10-CM | POA: Diagnosis not present

## 2014-05-15 DIAGNOSIS — S72019A Unspecified intracapsular fracture of unspecified femur, initial encounter for closed fracture: Secondary | ICD-10-CM | POA: Diagnosis not present

## 2014-05-16 DIAGNOSIS — S72019A Unspecified intracapsular fracture of unspecified femur, initial encounter for closed fracture: Secondary | ICD-10-CM | POA: Diagnosis not present

## 2014-05-17 DIAGNOSIS — S72019A Unspecified intracapsular fracture of unspecified femur, initial encounter for closed fracture: Secondary | ICD-10-CM | POA: Diagnosis not present

## 2014-05-18 DIAGNOSIS — S72019A Unspecified intracapsular fracture of unspecified femur, initial encounter for closed fracture: Secondary | ICD-10-CM | POA: Diagnosis not present

## 2014-05-19 DIAGNOSIS — S72019A Unspecified intracapsular fracture of unspecified femur, initial encounter for closed fracture: Secondary | ICD-10-CM | POA: Diagnosis not present

## 2014-05-20 DIAGNOSIS — S72019A Unspecified intracapsular fracture of unspecified femur, initial encounter for closed fracture: Secondary | ICD-10-CM | POA: Diagnosis not present

## 2014-05-21 DIAGNOSIS — S72019A Unspecified intracapsular fracture of unspecified femur, initial encounter for closed fracture: Secondary | ICD-10-CM | POA: Diagnosis not present

## 2014-05-22 DIAGNOSIS — S72019A Unspecified intracapsular fracture of unspecified femur, initial encounter for closed fracture: Secondary | ICD-10-CM | POA: Diagnosis not present

## 2014-05-23 DIAGNOSIS — S72019A Unspecified intracapsular fracture of unspecified femur, initial encounter for closed fracture: Secondary | ICD-10-CM | POA: Diagnosis not present

## 2014-05-24 DIAGNOSIS — S72019A Unspecified intracapsular fracture of unspecified femur, initial encounter for closed fracture: Secondary | ICD-10-CM | POA: Diagnosis not present

## 2014-05-25 DIAGNOSIS — S72019A Unspecified intracapsular fracture of unspecified femur, initial encounter for closed fracture: Secondary | ICD-10-CM | POA: Diagnosis not present

## 2014-05-26 DIAGNOSIS — S72019A Unspecified intracapsular fracture of unspecified femur, initial encounter for closed fracture: Secondary | ICD-10-CM | POA: Diagnosis not present

## 2014-05-27 DIAGNOSIS — S72019A Unspecified intracapsular fracture of unspecified femur, initial encounter for closed fracture: Secondary | ICD-10-CM | POA: Diagnosis not present

## 2014-05-28 DIAGNOSIS — S72019A Unspecified intracapsular fracture of unspecified femur, initial encounter for closed fracture: Secondary | ICD-10-CM | POA: Diagnosis not present

## 2014-05-29 DIAGNOSIS — S72019A Unspecified intracapsular fracture of unspecified femur, initial encounter for closed fracture: Secondary | ICD-10-CM | POA: Diagnosis not present

## 2014-05-30 DIAGNOSIS — S72019A Unspecified intracapsular fracture of unspecified femur, initial encounter for closed fracture: Secondary | ICD-10-CM | POA: Diagnosis not present

## 2014-05-31 DIAGNOSIS — S72019A Unspecified intracapsular fracture of unspecified femur, initial encounter for closed fracture: Secondary | ICD-10-CM | POA: Diagnosis not present

## 2014-06-01 DIAGNOSIS — S72019A Unspecified intracapsular fracture of unspecified femur, initial encounter for closed fracture: Secondary | ICD-10-CM | POA: Diagnosis not present

## 2014-06-02 DIAGNOSIS — S72019A Unspecified intracapsular fracture of unspecified femur, initial encounter for closed fracture: Secondary | ICD-10-CM | POA: Diagnosis not present

## 2014-06-03 DIAGNOSIS — S72019A Unspecified intracapsular fracture of unspecified femur, initial encounter for closed fracture: Secondary | ICD-10-CM | POA: Diagnosis not present

## 2014-06-04 DIAGNOSIS — S72019A Unspecified intracapsular fracture of unspecified femur, initial encounter for closed fracture: Secondary | ICD-10-CM | POA: Diagnosis not present

## 2014-06-05 DIAGNOSIS — S72019A Unspecified intracapsular fracture of unspecified femur, initial encounter for closed fracture: Secondary | ICD-10-CM | POA: Diagnosis not present

## 2014-06-06 DIAGNOSIS — S72019A Unspecified intracapsular fracture of unspecified femur, initial encounter for closed fracture: Secondary | ICD-10-CM | POA: Diagnosis not present

## 2014-06-07 DIAGNOSIS — S72019A Unspecified intracapsular fracture of unspecified femur, initial encounter for closed fracture: Secondary | ICD-10-CM | POA: Diagnosis not present

## 2014-06-08 DIAGNOSIS — S72019A Unspecified intracapsular fracture of unspecified femur, initial encounter for closed fracture: Secondary | ICD-10-CM | POA: Diagnosis not present

## 2014-06-09 DIAGNOSIS — S72019A Unspecified intracapsular fracture of unspecified femur, initial encounter for closed fracture: Secondary | ICD-10-CM | POA: Diagnosis not present

## 2014-06-10 DIAGNOSIS — S72019A Unspecified intracapsular fracture of unspecified femur, initial encounter for closed fracture: Secondary | ICD-10-CM | POA: Diagnosis not present

## 2014-06-11 DIAGNOSIS — S72019A Unspecified intracapsular fracture of unspecified femur, initial encounter for closed fracture: Secondary | ICD-10-CM | POA: Diagnosis not present

## 2014-06-12 DIAGNOSIS — S72019A Unspecified intracapsular fracture of unspecified femur, initial encounter for closed fracture: Secondary | ICD-10-CM | POA: Diagnosis not present

## 2014-06-13 DIAGNOSIS — S72019A Unspecified intracapsular fracture of unspecified femur, initial encounter for closed fracture: Secondary | ICD-10-CM | POA: Diagnosis not present

## 2014-06-16 ENCOUNTER — Ambulatory Visit (INDEPENDENT_AMBULATORY_CARE_PROVIDER_SITE_OTHER): Payer: Medicare Other | Admitting: Internal Medicine

## 2014-06-16 ENCOUNTER — Encounter: Payer: Self-pay | Admitting: Internal Medicine

## 2014-06-16 ENCOUNTER — Other Ambulatory Visit (INDEPENDENT_AMBULATORY_CARE_PROVIDER_SITE_OTHER): Payer: Medicare Other

## 2014-06-16 VITALS — BP 128/82 | HR 73 | Temp 98.4°F | Wt 170.8 lb

## 2014-06-16 DIAGNOSIS — E119 Type 2 diabetes mellitus without complications: Secondary | ICD-10-CM

## 2014-06-16 DIAGNOSIS — M810 Age-related osteoporosis without current pathological fracture: Secondary | ICD-10-CM

## 2014-06-16 DIAGNOSIS — R7989 Other specified abnormal findings of blood chemistry: Secondary | ICD-10-CM | POA: Diagnosis not present

## 2014-06-16 DIAGNOSIS — J309 Allergic rhinitis, unspecified: Secondary | ICD-10-CM | POA: Diagnosis not present

## 2014-06-16 DIAGNOSIS — I10 Essential (primary) hypertension: Secondary | ICD-10-CM | POA: Diagnosis not present

## 2014-06-16 DIAGNOSIS — Z0000 Encounter for general adult medical examination without abnormal findings: Secondary | ICD-10-CM

## 2014-06-16 DIAGNOSIS — E785 Hyperlipidemia, unspecified: Secondary | ICD-10-CM

## 2014-06-16 LAB — URINALYSIS, ROUTINE W REFLEX MICROSCOPIC
Bilirubin Urine: NEGATIVE
KETONES UR: NEGATIVE
Nitrite: NEGATIVE
PH: 6.5 (ref 5.0–8.0)
SPECIFIC GRAVITY, URINE: 1.015 (ref 1.000–1.030)
Total Protein, Urine: NEGATIVE
Urine Glucose: NEGATIVE
Urobilinogen, UA: 0.2 (ref 0.0–1.0)

## 2014-06-16 LAB — HEPATIC FUNCTION PANEL
ALT: 26 U/L (ref 0–35)
AST: 24 U/L (ref 0–37)
Albumin: 4.4 g/dL (ref 3.5–5.2)
Alkaline Phosphatase: 89 U/L (ref 39–117)
Bilirubin, Direct: 0.1 mg/dL (ref 0.0–0.3)
Total Bilirubin: 0.4 mg/dL (ref 0.2–1.2)
Total Protein: 7 g/dL (ref 6.0–8.3)

## 2014-06-16 LAB — CBC WITH DIFFERENTIAL/PLATELET
BASOS PCT: 0.4 % (ref 0.0–3.0)
Basophils Absolute: 0 10*3/uL (ref 0.0–0.1)
EOS PCT: 4.8 % (ref 0.0–5.0)
Eosinophils Absolute: 0.4 10*3/uL (ref 0.0–0.7)
HCT: 39 % (ref 36.0–46.0)
Hemoglobin: 13.1 g/dL (ref 12.0–15.0)
LYMPHS ABS: 1.8 10*3/uL (ref 0.7–4.0)
LYMPHS PCT: 21.6 % (ref 12.0–46.0)
MCHC: 33.7 g/dL (ref 30.0–36.0)
MCV: 88.3 fl (ref 78.0–100.0)
MONO ABS: 0.6 10*3/uL (ref 0.1–1.0)
Monocytes Relative: 7.4 % (ref 3.0–12.0)
NEUTROS ABS: 5.6 10*3/uL (ref 1.4–7.7)
NEUTROS PCT: 65.8 % (ref 43.0–77.0)
Platelets: 350 10*3/uL (ref 150.0–400.0)
RBC: 4.41 Mil/uL (ref 3.87–5.11)
RDW: 14.1 % (ref 11.5–15.5)
WBC: 8.5 10*3/uL (ref 4.0–10.5)

## 2014-06-16 LAB — BASIC METABOLIC PANEL
BUN: 18 mg/dL (ref 6–23)
CHLORIDE: 103 meq/L (ref 96–112)
CO2: 29 meq/L (ref 19–32)
CREATININE: 0.7 mg/dL (ref 0.40–1.20)
Calcium: 9.5 mg/dL (ref 8.4–10.5)
GFR: 88.24 mL/min (ref >60.00)
Glucose, Bld: 127 mg/dL — ABNORMAL HIGH (ref 70–99)
POTASSIUM: 4.2 meq/L (ref 3.5–5.1)
Sodium: 138 mEq/L (ref 135–145)

## 2014-06-16 LAB — LIPID PANEL
Cholesterol: 137 mg/dL (ref 0–200)
HDL: 44.5 mg/dL (ref >39.00)
NonHDL: 92.5
TRIGLYCERIDES: 202 mg/dL — AB (ref 0.0–149.0)
Total CHOL/HDL Ratio: 3
VLDL: 40.4 mg/dL — AB (ref 0.0–40.0)

## 2014-06-16 LAB — LDL CHOLESTEROL, DIRECT: LDL DIRECT: 66 mg/dL

## 2014-06-16 LAB — TSH: TSH: 0.57 u[IU]/mL (ref 0.35–4.50)

## 2014-06-16 LAB — HEMOGLOBIN A1C: Hgb A1c MFr Bld: 6.1 % (ref 4.6–6.5)

## 2014-06-16 LAB — MICROALBUMIN / CREATININE URINE RATIO
Creatinine,U: 24.2 mg/dL
Microalb Creat Ratio: 2.9 mg/g (ref 0.0–30.0)
Microalb, Ur: <0.7 mg/dL (ref 0.0–1.9)

## 2014-06-16 MED ORDER — ALENDRONATE SODIUM 70 MG PO TABS: 70.0000 mg | ORAL_TABLET | ORAL | Status: DC

## 2014-06-16 MED ORDER — METHYLPREDNISOLONE ACETATE 80 MG/ML IJ SUSP
80.0000 mg | Freq: Once | INTRAMUSCULAR | Status: AC
  Administered 2014-06-16: 80 mg via INTRAMUSCULAR

## 2014-06-16 NOTE — Assessment & Plan Note (Signed)
Ok to cont calcium.vit d, also start fosamax 70 qwk. F/u dxa 1-2 yrs

## 2014-06-16 NOTE — Patient Instructions (Addendum)
You had the steroid shot today  Please take all new medication as prescribed - the fosamax for osteoporosis  Please continue all other medications as before, and refills have been done if requested.  Please have the pharmacy call with any other refills you may need.  Please continue your efforts at being more active, low cholesterol diet, and weight control.  You are otherwise up to date with prevention measures today.  Please keep your appointments with your specialists as you may have planned  Please go to the LAB in the Basement (turn left off the elevator) for the tests to be done today  You will be contacted by phone if any changes need to be made immediately.  Otherwise, you will receive a letter about your results with an explanation, but please check with MyChart first.  Please remember to sign up for MyChart if you have not done so, as this will be important to you in the future with finding out test results, communicating by private email, and scheduling acute appointments online when needed.  Please return in 3 months, or sooner if needed

## 2014-06-16 NOTE — Progress Notes (Signed)
Pre visit review using our clinic review tool, if applicable. No additional management support is needed unless otherwise documented below in the visit note. 

## 2014-06-16 NOTE — Progress Notes (Signed)
Subjective:    Patient ID: Katherine [name], female    DOB: 02/23/1945, 69 y.o.   MRN: [id number]  HPI  Here for wellness and f/u;  Overall doing ok;  Pt denies Chest pain, worsening SOB, DOE, wheezing, orthopnea, PND, worsening LE edema, palpitations, dizziness or syncope.  Pt denies neurological change such as new headache, facial or extremity weakness.  Pt denies polydipsia, polyuria, or low sugar symptoms. Pt states overall good compliance with treatment and medications, good tolerability, and has been trying to follow appropriate diet.  Pt denies worsening depressive symptoms, suicidal ideation or panic. No fever, night sweats, wt loss, loss of appetite, or other constitutional symptoms.  Pt states good ability with ADL's, has low fall risk, home safety reviewed and adequate, no other significant changes in hearing or vision, and only occasionally active with exercise. Needs to be seen here every 3 mo pt states so that she qualifies for more home assistance (medicaid)   June 2014 DXA with osteoporosis, never started the fosamax per pt. Does have several wks ongoing nasal allergy symptoms with clearish congestion, itch and sneezing, without fever, pain, ST, cough, swelling or wheezing., current meds not working seasonally.  .   Past Medical History  Diagnosis Date  . ABDOMINAL PAIN, LOWER 02/04/2010  . ABNORMAL THYROID FUNCTION TESTS 08/04/2009  . ALLERGIC RHINITIS 09/05/2006  . ANXIETY 07/31/2007  . DEPRESSION 09/05/2006  . DIVERTICULOSIS, COLON 09/05/2006  . Dysuria 04/15/2010  . Failure to thrive in childhood 04/06/2010  . FATIGUE 04/15/2010  . FIBROMYALGIA 05/10/2009  . FRACTURE, PELVIS, RIGHT 04/15/2010  . GASTROENTERITIS, ACUTE 12/25/2008  . GERD 09/05/2006  . HYPERLIPIDEMIA 09/05/2006  . HYPERTENSION 09/05/2006  . KNEE PAIN, LEFT 02/04/2010  . OSTEOARTHRITIS 09/05/2006  . OSTEOPOROSIS 07/31/2007  . Other specified forms of hearing loss 11/01/2009  . SINUSITIS- ACUTE-NOS 10/09/2007  . SKIN LESION 05/10/2009    . SPRAIN&STRAIN OTH SPEC SITES SHOULDER&UPPER ARM 11/01/2009  . Unspecified hearing loss 10/09/2007  . URI 02/20/2007  . UTI 02/20/2007  . Blood transfusion   . Cataract     resolved -surgery done  . Heart murmur   . DIABETES MELLITUS, TYPE II 07/31/2007    no meds   Past Surgical History  Procedure Laterality Date  . Tonsillectomy    . Breast biopsy    . Abdominal hysterectomy  12/08    Bladder tac, partial hysterectomy  . Inguinal herniorrhapy      right Dr. Freida BusmanAllen 06/2009  . Catarct removal      both eyes  . 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 I&d knee with poly exchange Left 01/03/2013    Procedure: IRRIGATION AND DEBRIDEMENT LEFT KNEE WITH POLY EXCHANGE;  Surgeon: Kathryne Hitchhristopher Y Blackman, MD;  Location: [facility];  Service: Orthopedics;  Laterality: Left;    reports that she has never smoked. She does not have any smokeless tobacco history on file. She reports that she does not drink alcohol or use illicit drugs. family history includes Coronary artery disease in her other; Diabetes in her other. There is no history of Colon cancer, Esophageal cancer, Stomach cancer, or Rectal cancer. Allergies  Allergen Reactions  . Penicillins     "made me nervous"  . Thioridazine Hcl Itching   Current Outpatient Prescriptions on File Prior to Visit  Medication Sig Dispense Refill  . albuterol (PROVENTIL  HFA;VENTOLIN HFA) 108 (90 BASE) MCG/ACT inhaler Inhale 2 puffs into the lungs every 6 (six) hours as needed for wheezing or shortness of breath. 8.5 g 5  . amLODipine (NORVASC) 10 MG tablet Take 0.5 tablets (5 mg total) by mouth every morning. 90 tablet 3  . aspirin EC 325 MG EC tablet Take 1 tablet (325 mg total) by mouth 2 (two) times daily after a meal. 45 tablet 0  . atorvastatin (LIPITOR) 40 MG tablet Take 1 tablet (40 mg total) by mouth every  morning. 90 tablet 3  . budesonide (RHINOCORT AQUA) 32 MCG/ACT nasal spray Place 2 sprays into both nostrils daily. 8.6 g 5  . busPIRone (BUSPAR) 10 MG tablet Take 1 tablet (10 mg total) by mouth 3 (three) times daily. 60 tablet 11  . calcium-vitamin D (OSCAL WITH D) 500-200 MG-UNIT per tablet Take 1 tablet by mouth 3 (three) times daily. 270 tablet 3  . cetirizine (ZYRTEC) 10 MG tablet Take 1 tablet (10 mg total) by mouth daily. 30 tablet 11  . diclofenac sodium (VOLTAREN) 1 % GEL Apply topically 4 (four) times daily.    . Diclofenac Sodium 1.5 % SOLN     . etodolac (LODINE) 400 MG tablet Take 400 mg by mouth 2 (two) times daily.    [name] glucose blood (ONETOUCH VERIO) test strip Use to check blood sugar twice a day Dx 250.00 180 each 3  . hydrOXYzine (ATARAX/VISTARIL) 10 MG tablet Take 1 tablet (10 mg total) by mouth 2 (two) times daily as needed. 30 tablet 11  . omeprazole (PRILOSEC) 20 MG capsule     . ONE TOUCH LANCETS MISC Use as directed once daily to check blood sugar.  Diagnosis code 250.00 200 each 11  . pantoprazole (PROTONIX) 40 MG tablet Take 1 tablet (40 mg total) by mouth daily. 90 tablet 3  . potassium chloride SA (K-DUR,KLOR-CON) 20 MEQ tablet Take 1 tablet (20 mEq total) by mouth 2 (two) times daily. 180 tablet 3  . pregabalin (LYRICA) 75 MG capsule Take 1 capsule (75 mg total) by mouth 2 (two) times daily. 60 capsule 5  . promethazine (PHENERGAN) 12.5 MG tablet TAKE 1 TABLET BY MOUTH EVERY 8 HOURS AS NEEDED FOR NAUSEA AND VOMITING 40 tablet 0  . risperiDONE (RISPERDAL) 3 MG tablet Take 3 mg by mouth at bedtime.     [name] tiZANidine (ZANAFLEX) 4 MG tablet Take 1 tablet (4 mg total) by mouth every 6 (six) hours as needed for muscle spasms. 40 tablet 11  . azelastine (ASTELIN) 0.1 % nasal spray Place 1 spray into both nostrils 2 (two) times daily. Use in each nostril as directed (Patient not taking: Reported on 06/16/2014) 30 mL 11  . benztropine (COGENTIN) 0.5 MG tablet Take 0.5 mg by  mouth 2 (two) times daily.      [name] ipratropium (ATROVENT) 0.06 % nasal spray Place 1 spray into both nostrils 3 (three) times daily. (Patient not taking: Reported on 06/16/2014) 15 mL 11  . mometasone (NASONEX) 50 MCG/ACT nasal spray Place 2 sprays into the nose daily. (Patient not taking: Reported on 06/16/2014) 17 g 11  . ondansetron (ZOFRAN) 4 MG tablet Take 1 tablet (4 mg total) by mouth every 8 (eight) hours as needed for nausea or vomiting. (Patient not taking: Reported on 06/16/2014) 40 tablet 1   No current facility-administered medications on file prior to visit.    Review of Systems Constitutional: Negative for increased diaphoresis, other activity, appetite or siginficant weight change  other than noted HENT: Negative for worsening hearing loss, ear pain, facial swelling, mouth sores and neck stiffness.   Eyes: Negative for other worsening pain, redness or visual disturbance.  Respiratory: Negative for shortness of breath and wheezing  Cardiovascular: Negative for chest pain and palpitations.  Gastrointestinal: Negative for diarrhea, blood in stool, abdominal distention or other pain Genitourinary: Negative for hematuria, flank pain or change in urine volume.  Musculoskeletal: Negative for myalgias or other joint complaints.  Skin: Negative for color change and wound or drainage.  Neurological: Negative for syncope and numbness. other than noted Hematological: Negative for adenopathy. or other swelling Psychiatric/Behavioral: Negative for hallucinations, SI, self-injury, decreased concentration or other worsening agitation.      Objective:   Physical Exam BP 128/82 mmHg  Pulse 73  Temp(Src) 98.4 F (36.9 C) (Oral)  Wt 170 lb 12.8 oz (77.474 kg)  SpO2 98% VS noted,  Constitutional: Pt is oriented to person, place, and time. Appears well-developed and well-nourished, in no significant distress Head: Normocephalic and atraumatic.  Right Ear: External ear normal.  Left Ear:  External ear normal.  Nose: Nose normal.  Mouth/Throat: Oropharynx is clear and moist.  Bilat tm's with mild erythema.  Max sinus areas non tender.  Pharynx with mild erythema, no exudate Eyes: Conjunctivae and EOM are normal. Pupils are equal, round, and reactive to light.  Neck: Normal range of motion. Neck supple. No JVD present. No tracheal deviation present or significant neck LA or mass Cardiovascular: Normal rate, regular rhythm, normal heart sounds and intact distal pulses.   Pulmonary/Chest: Effort normal and breath sounds without rales or wheezing  Abdominal: Soft. Bowel sounds are normal. NT. No HSM  Musculoskeletal: Normal range of motion. Exhibits no edema.  Lymphadenopathy:  Has no cervical adenopathy.  Neurological: Pt is alert and oriented to person, place, and time. Pt has normal reflexes. No cranial nerve deficit. Motor grossly intact Skin: Skin is warm and dry. No rash noted.  Psychiatric:  Has nervous mood and affect. Behavior is normal.     Assessment & Plan:

## 2014-06-16 NOTE — Assessment & Plan Note (Signed)
stable overall by history and exam, recent data reviewed with pt, and pt to continue medical treatment as before,  to f/u any worsening symptoms or concerns Lab Results  Component Value Date   HGBA1C 5.9 12/11/2013

## 2014-06-16 NOTE — Assessment & Plan Note (Signed)

## 2014-06-16 NOTE — Assessment & Plan Note (Signed)
stable overall by history and exam, recent data reviewed with pt, and pt to continue medical treatment as before,  to f/u any worsening symptoms or concerns BP Readings from Last 3 Encounters:  06/16/14 128/82  05/08/14 136/64  04/08/14 108/43

## 2014-06-16 NOTE — Assessment & Plan Note (Signed)
With mild seasonal flare, for depomedrol 80 mg  IM, re-start home meds

## 2014-06-21 DIAGNOSIS — S72019A Unspecified intracapsular fracture of unspecified femur, initial encounter for closed fracture: Secondary | ICD-10-CM | POA: Diagnosis not present

## 2014-06-22 DIAGNOSIS — S72019A Unspecified intracapsular fracture of unspecified femur, initial encounter for closed fracture: Secondary | ICD-10-CM | POA: Diagnosis not present

## 2014-06-23 ENCOUNTER — Other Ambulatory Visit: Payer: Self-pay | Admitting: Internal Medicine

## 2014-06-23 ENCOUNTER — Encounter: Payer: 59 | Admitting: Internal Medicine

## 2014-06-23 DIAGNOSIS — S72019A Unspecified intracapsular fracture of unspecified femur, initial encounter for closed fracture: Secondary | ICD-10-CM | POA: Diagnosis not present

## 2014-06-24 DIAGNOSIS — S72019A Unspecified intracapsular fracture of unspecified femur, initial encounter for closed fracture: Secondary | ICD-10-CM | POA: Diagnosis not present

## 2014-06-25 DIAGNOSIS — S72019A Unspecified intracapsular fracture of unspecified femur, initial encounter for closed fracture: Secondary | ICD-10-CM | POA: Diagnosis not present

## 2014-06-26 ENCOUNTER — Other Ambulatory Visit: Payer: Self-pay

## 2014-06-26 DIAGNOSIS — S72019A Unspecified intracapsular fracture of unspecified femur, initial encounter for closed fracture: Secondary | ICD-10-CM | POA: Diagnosis not present

## 2014-06-26 MED ORDER — ALBUTEROL SULFATE HFA 108 (90 BASE) MCG/ACT IN AERS: 2.0000 | INHALATION_SPRAY | Freq: Four times a day (QID) | RESPIRATORY_TRACT | Status: DC | PRN

## 2014-06-27 DIAGNOSIS — S72019A Unspecified intracapsular fracture of unspecified femur, initial encounter for closed fracture: Secondary | ICD-10-CM | POA: Diagnosis not present

## 2014-06-28 DIAGNOSIS — S72019A Unspecified intracapsular fracture of unspecified femur, initial encounter for closed fracture: Secondary | ICD-10-CM | POA: Diagnosis not present

## 2014-06-29 DIAGNOSIS — S72019A Unspecified intracapsular fracture of unspecified femur, initial encounter for closed fracture: Secondary | ICD-10-CM | POA: Diagnosis not present

## 2014-06-30 DIAGNOSIS — S72019A Unspecified intracapsular fracture of unspecified femur, initial encounter for closed fracture: Secondary | ICD-10-CM | POA: Diagnosis not present

## 2014-07-01 DIAGNOSIS — S72019A Unspecified intracapsular fracture of unspecified femur, initial encounter for closed fracture: Secondary | ICD-10-CM | POA: Diagnosis not present

## 2014-07-02 DIAGNOSIS — S72019A Unspecified intracapsular fracture of unspecified femur, initial encounter for closed fracture: Secondary | ICD-10-CM | POA: Diagnosis not present

## 2014-07-03 DIAGNOSIS — S72019A Unspecified intracapsular fracture of unspecified femur, initial encounter for closed fracture: Secondary | ICD-10-CM | POA: Diagnosis not present

## 2014-07-04 DIAGNOSIS — S72019A Unspecified intracapsular fracture of unspecified femur, initial encounter for closed fracture: Secondary | ICD-10-CM | POA: Diagnosis not present

## 2014-07-05 DIAGNOSIS — S72019A Unspecified intracapsular fracture of unspecified femur, initial encounter for closed fracture: Secondary | ICD-10-CM | POA: Diagnosis not present

## 2014-07-06 ENCOUNTER — Telehealth: Payer: Self-pay | Admitting: Internal Medicine

## 2014-07-06 DIAGNOSIS — S72019A Unspecified intracapsular fracture of unspecified femur, initial encounter for closed fracture: Secondary | ICD-10-CM | POA: Diagnosis not present

## 2014-07-06 NOTE — Telephone Encounter (Signed)
Patient is requesting home care b/c of tremors.  Patient would like you to give a call to [facility] at [phone].  Contact is Malachi BondsGloria.

## 2014-07-07 DIAGNOSIS — S72019A Unspecified intracapsular fracture of unspecified femur, initial encounter for closed fracture: Secondary | ICD-10-CM | POA: Diagnosis not present

## 2014-07-07 NOTE — Telephone Encounter (Signed)
Unfortunately tremors by itself would not be a reason to need Home Health

## 2014-07-08 DIAGNOSIS — S72019A Unspecified intracapsular fracture of unspecified femur, initial encounter for closed fracture: Secondary | ICD-10-CM | POA: Diagnosis not present

## 2014-07-08 NOTE — Telephone Encounter (Signed)
Pt advised of MD's advisement via VM

## 2014-07-09 DIAGNOSIS — S72019A Unspecified intracapsular fracture of unspecified femur, initial encounter for closed fracture: Secondary | ICD-10-CM | POA: Diagnosis not present

## 2014-07-10 DIAGNOSIS — S72019A Unspecified intracapsular fracture of unspecified femur, initial encounter for closed fracture: Secondary | ICD-10-CM | POA: Diagnosis not present

## 2014-07-11 DIAGNOSIS — S72019A Unspecified intracapsular fracture of unspecified femur, initial encounter for closed fracture: Secondary | ICD-10-CM | POA: Diagnosis not present

## 2014-07-12 DIAGNOSIS — S72019A Unspecified intracapsular fracture of unspecified femur, initial encounter for closed fracture: Secondary | ICD-10-CM | POA: Diagnosis not present

## 2014-07-13 DIAGNOSIS — S72019A Unspecified intracapsular fracture of unspecified femur, initial encounter for closed fracture: Secondary | ICD-10-CM | POA: Diagnosis not present

## 2014-07-14 ENCOUNTER — Telehealth: Payer: Self-pay | Admitting: Internal Medicine

## 2014-07-14 DIAGNOSIS — S72019A Unspecified intracapsular fracture of unspecified femur, initial encounter for closed fracture: Secondary | ICD-10-CM | POA: Diagnosis not present

## 2014-07-14 MED ORDER — BUDESONIDE 32 MCG/ACT NA SUSP: 2.0000 | Freq: Every day | NASAL | Status: DC

## 2014-07-14 NOTE — Telephone Encounter (Signed)
Patient requesting refill for budesonide (RHINOCORT AQUA) 32 MCG/ACT nasal spray [[id number]] . Pharmacy is [facility]

## 2014-07-15 DIAGNOSIS — S72019A Unspecified intracapsular fracture of unspecified femur, initial encounter for closed fracture: Secondary | ICD-10-CM | POA: Diagnosis not present

## 2014-07-16 ENCOUNTER — Telehealth: Payer: Self-pay | Admitting: Internal Medicine

## 2014-07-16 DIAGNOSIS — S72019A Unspecified intracapsular fracture of unspecified femur, initial encounter for closed fracture: Secondary | ICD-10-CM | POA: Diagnosis not present

## 2014-07-16 MED ORDER — OMEPRAZOLE 20 MG PO CPDR: 20.0000 mg | DELAYED_RELEASE_CAPSULE | Freq: Every day | ORAL | Status: DC

## 2014-07-16 NOTE — Telephone Encounter (Signed)
Patient is requesting refill for omeprazole (PRILOSEC) 20 MG capsule [[id number] pharmacy is [facility]

## 2014-07-17 DIAGNOSIS — S72019A Unspecified intracapsular fracture of unspecified femur, initial encounter for closed fracture: Secondary | ICD-10-CM | POA: Diagnosis not present

## 2014-07-18 DIAGNOSIS — S72019A Unspecified intracapsular fracture of unspecified femur, initial encounter for closed fracture: Secondary | ICD-10-CM | POA: Diagnosis not present

## 2014-07-19 DIAGNOSIS — S72019A Unspecified intracapsular fracture of unspecified femur, initial encounter for closed fracture: Secondary | ICD-10-CM | POA: Diagnosis not present

## 2014-07-20 ENCOUNTER — Telehealth: Payer: Self-pay | Admitting: Internal Medicine

## 2014-07-20 DIAGNOSIS — S72019A Unspecified intracapsular fracture of unspecified femur, initial encounter for closed fracture: Secondary | ICD-10-CM | POA: Diagnosis not present

## 2014-07-20 MED ORDER — IPRATROPIUM BROMIDE 0.06 % NA SOLN: 1.0000 | Freq: Three times a day (TID) | NASAL | Status: DC

## 2014-07-20 NOTE — Telephone Encounter (Signed)
Pt request refill for ipratropium (ATROVENT) 0.06 % nasal spray to be [facility] to [facility]. Please help

## 2014-07-20 NOTE — Telephone Encounter (Signed)
[facility]...Raechel Chute

## 2014-07-21 DIAGNOSIS — S72019A Unspecified intracapsular fracture of unspecified femur, initial encounter for closed fracture: Secondary | ICD-10-CM | POA: Diagnosis not present

## 2014-07-22 DIAGNOSIS — S72019A Unspecified intracapsular fracture of unspecified femur, initial encounter for closed fracture: Secondary | ICD-10-CM | POA: Diagnosis not present

## 2014-07-23 ENCOUNTER — Telehealth: Payer: Self-pay | Admitting: Internal Medicine

## 2014-07-23 DIAGNOSIS — S72019A Unspecified intracapsular fracture of unspecified femur, initial encounter for closed fracture: Secondary | ICD-10-CM | POA: Diagnosis not present

## 2014-07-23 MED ORDER — TRIAMCINOLONE ACETONIDE 55 MCG/ACT NA AERO: 2.0000 | INHALATION_SPRAY | Freq: Every day | NASAL | Status: DC

## 2014-07-23 NOTE — Telephone Encounter (Signed)
Patient states insurance will not cover nasonex anymore.  Is requesting something in place to be sent to [facility] [phone]

## 2014-07-23 NOTE — Telephone Encounter (Signed)
Pt informed of change

## 2014-07-23 NOTE — Telephone Encounter (Signed)
Ok for change to nasacort - done erx 

## 2014-07-24 DIAGNOSIS — S72019A Unspecified intracapsular fracture of unspecified femur, initial encounter for closed fracture: Secondary | ICD-10-CM | POA: Diagnosis not present

## 2014-07-24 MED ORDER — FLUTICASONE PROPIONATE 50 MCG/ACT NA SUSP: 2.0000 | Freq: Every day | NASAL | Status: DC

## 2014-07-24 NOTE — Telephone Encounter (Signed)
Patient states insurance will not cover budesonide anymore either.  Is requesting something in place of that med to be sent to her pharmacy as well.

## 2014-07-24 NOTE — Telephone Encounter (Signed)
Please advise 

## 2014-07-24 NOTE — Telephone Encounter (Signed)
Patient called and said insurance will cover what I believe is the generic for Flonase and Arnuity.

## 2014-07-24 NOTE — Telephone Encounter (Signed)
Pt will call her pharmacist and find out

## 2014-07-24 NOTE — Addendum Note (Signed)
Addended by: Corwin Levins on: 07/24/2014 04:52 PM   Modules accepted: Orders

## 2014-07-24 NOTE — Telephone Encounter (Signed)
Please advise which one you want to use

## 2014-07-24 NOTE — Telephone Encounter (Signed)
error 

## 2014-07-24 NOTE — Telephone Encounter (Signed)
Done erx 

## 2014-07-24 NOTE — Telephone Encounter (Signed)
Pt aware.

## 2014-07-24 NOTE — Telephone Encounter (Signed)
I am not sure which nasal steroid would be covered by insurance, as there is no way for me to know this.  Please ask pt to ask pharmacist who often is able to tell which med similar to these nasal steroids would be covered by her insurance

## 2014-07-25 DIAGNOSIS — S72019A Unspecified intracapsular fracture of unspecified femur, initial encounter for closed fracture: Secondary | ICD-10-CM | POA: Diagnosis not present

## 2014-07-26 DIAGNOSIS — S72019A Unspecified intracapsular fracture of unspecified femur, initial encounter for closed fracture: Secondary | ICD-10-CM | POA: Diagnosis not present

## 2014-07-27 ENCOUNTER — Telehealth: Payer: Self-pay | Admitting: Internal Medicine

## 2014-07-27 DIAGNOSIS — S72019A Unspecified intracapsular fracture of unspecified femur, initial encounter for closed fracture: Secondary | ICD-10-CM | POA: Diagnosis not present

## 2014-07-27 NOTE — Telephone Encounter (Signed)
Has a question in regards to two nasal sprays that were sent over.  Pharmacy needs to know if patient is to take nasacort and flonase at same time.

## 2014-07-28 DIAGNOSIS — S72019A Unspecified intracapsular fracture of unspecified femur, initial encounter for closed fracture: Secondary | ICD-10-CM | POA: Diagnosis not present

## 2014-07-28 NOTE — Telephone Encounter (Signed)
[facility]

## 2014-07-28 NOTE — Telephone Encounter (Signed)
No only one; we had to change the nasacort since the insurance will no longer pay for it

## 2014-07-29 DIAGNOSIS — S72019A Unspecified intracapsular fracture of unspecified femur, initial encounter for closed fracture: Secondary | ICD-10-CM | POA: Diagnosis not present

## 2014-07-30 DIAGNOSIS — S72019A Unspecified intracapsular fracture of unspecified femur, initial encounter for closed fracture: Secondary | ICD-10-CM | POA: Diagnosis not present

## 2014-07-31 DIAGNOSIS — S72019A Unspecified intracapsular fracture of unspecified femur, initial encounter for closed fracture: Secondary | ICD-10-CM | POA: Diagnosis not present

## 2014-08-01 DIAGNOSIS — S72019A Unspecified intracapsular fracture of unspecified femur, initial encounter for closed fracture: Secondary | ICD-10-CM | POA: Diagnosis not present

## 2014-08-02 DIAGNOSIS — S72019A Unspecified intracapsular fracture of unspecified femur, initial encounter for closed fracture: Secondary | ICD-10-CM | POA: Diagnosis not present

## 2014-08-03 DIAGNOSIS — S72019A Unspecified intracapsular fracture of unspecified femur, initial encounter for closed fracture: Secondary | ICD-10-CM | POA: Diagnosis not present

## 2014-08-04 DIAGNOSIS — S72019A Unspecified intracapsular fracture of unspecified femur, initial encounter for closed fracture: Secondary | ICD-10-CM | POA: Diagnosis not present

## 2014-08-05 DIAGNOSIS — S72019A Unspecified intracapsular fracture of unspecified femur, initial encounter for closed fracture: Secondary | ICD-10-CM | POA: Diagnosis not present

## 2014-08-06 DIAGNOSIS — S72019A Unspecified intracapsular fracture of unspecified femur, initial encounter for closed fracture: Secondary | ICD-10-CM | POA: Diagnosis not present

## 2014-08-07 DIAGNOSIS — S72019A Unspecified intracapsular fracture of unspecified femur, initial encounter for closed fracture: Secondary | ICD-10-CM | POA: Diagnosis not present

## 2014-08-08 DIAGNOSIS — S72019A Unspecified intracapsular fracture of unspecified femur, initial encounter for closed fracture: Secondary | ICD-10-CM | POA: Diagnosis not present

## 2014-08-09 DIAGNOSIS — S72019A Unspecified intracapsular fracture of unspecified femur, initial encounter for closed fracture: Secondary | ICD-10-CM | POA: Diagnosis not present

## 2014-08-10 DIAGNOSIS — S72019A Unspecified intracapsular fracture of unspecified femur, initial encounter for closed fracture: Secondary | ICD-10-CM | POA: Diagnosis not present

## 2014-08-11 DIAGNOSIS — S72019A Unspecified intracapsular fracture of unspecified femur, initial encounter for closed fracture: Secondary | ICD-10-CM | POA: Diagnosis not present

## 2014-08-12 ENCOUNTER — Other Ambulatory Visit: Payer: Self-pay

## 2014-08-12 DIAGNOSIS — S72019A Unspecified intracapsular fracture of unspecified femur, initial encounter for closed fracture: Secondary | ICD-10-CM | POA: Diagnosis not present

## 2014-08-12 MED ORDER — ONDANSETRON HCL 4 MG PO TABS: 4.0000 mg | ORAL_TABLET | Freq: Three times a day (TID) | ORAL | Status: DC | PRN

## 2014-08-13 DIAGNOSIS — S72019A Unspecified intracapsular fracture of unspecified femur, initial encounter for closed fracture: Secondary | ICD-10-CM | POA: Diagnosis not present

## 2014-08-14 DIAGNOSIS — S72019A Unspecified intracapsular fracture of unspecified femur, initial encounter for closed fracture: Secondary | ICD-10-CM | POA: Diagnosis not present

## 2014-08-15 DIAGNOSIS — S72019A Unspecified intracapsular fracture of unspecified femur, initial encounter for closed fracture: Secondary | ICD-10-CM | POA: Diagnosis not present

## 2014-08-16 DIAGNOSIS — S72019A Unspecified intracapsular fracture of unspecified femur, initial encounter for closed fracture: Secondary | ICD-10-CM | POA: Diagnosis not present

## 2014-08-17 DIAGNOSIS — S72019A Unspecified intracapsular fracture of unspecified femur, initial encounter for closed fracture: Secondary | ICD-10-CM | POA: Diagnosis not present

## 2014-08-18 DIAGNOSIS — S72019A Unspecified intracapsular fracture of unspecified femur, initial encounter for closed fracture: Secondary | ICD-10-CM | POA: Diagnosis not present

## 2014-08-19 DIAGNOSIS — S72019A Unspecified intracapsular fracture of unspecified femur, initial encounter for closed fracture: Secondary | ICD-10-CM | POA: Diagnosis not present

## 2014-08-20 DIAGNOSIS — S72019A Unspecified intracapsular fracture of unspecified femur, initial encounter for closed fracture: Secondary | ICD-10-CM | POA: Diagnosis not present

## 2014-08-21 ENCOUNTER — Ambulatory Visit (INDEPENDENT_AMBULATORY_CARE_PROVIDER_SITE_OTHER): Payer: Medicare Other | Admitting: Podiatry

## 2014-08-21 DIAGNOSIS — B351 Tinea unguium: Secondary | ICD-10-CM | POA: Diagnosis not present

## 2014-08-21 DIAGNOSIS — M79609 Pain in unspecified limb: Secondary | ICD-10-CM

## 2014-08-21 DIAGNOSIS — S72019A Unspecified intracapsular fracture of unspecified femur, initial encounter for closed fracture: Secondary | ICD-10-CM | POA: Diagnosis not present

## 2014-08-22 DIAGNOSIS — S72019A Unspecified intracapsular fracture of unspecified femur, initial encounter for closed fracture: Secondary | ICD-10-CM | POA: Diagnosis not present

## 2014-08-23 DIAGNOSIS — S72019A Unspecified intracapsular fracture of unspecified femur, initial encounter for closed fracture: Secondary | ICD-10-CM | POA: Diagnosis not present

## 2014-08-24 DIAGNOSIS — S72019A Unspecified intracapsular fracture of unspecified femur, initial encounter for closed fracture: Secondary | ICD-10-CM | POA: Diagnosis not present

## 2014-08-24 NOTE — Progress Notes (Signed)
Patient ID: Katherine [name], female   DOB: 1945/06/19, 69 y.o.   MRN: [id number]  Subjective: 69 y.o. returns the office today for painful, elongated, thickened toenails which she cannot trim herself. Denies any redness or drainage around the nails. Denies any acute changes since last appointment and no new complaints today. Denies any systemic complaints such as fevers, chills, nausea, vomiting.   Objective: AAO 3, NAD DP/PT pulses palpable, CRT less than 3 seconds Protective sensation intact with Simms Weinstein monofilament, Achilles tendon reflex intact.  Nails hypertrophic, dystrophic, elongated, brittle, discolored 10. There is tenderness overlying the nails 1-5 bilaterally. There is no surrounding erythema or drainage along the nail sites. No open lesions or pre-ulcerative lesions are identified. No other areas of tenderness bilateral lower extremities. No overlying edema, erythema, increased warmth. No pain with calf compression, swelling, warmth, erythema.  Assessment: Patient presents with symptomatic onychomycosis  Plan: -Treatment options including alternatives, risks, complications were discussed -Nails sharply debrided 10 without complication/bleeding. -Discussed daily foot inspection. If there are any changes, to call the office immediately.  -Follow-up in 3 months or sooner if any problems are to arise. In the meantime, encouraged to call the office with any questions, concerns, changes symptoms.   Ovid Curd, DPM

## 2014-08-25 DIAGNOSIS — S72019A Unspecified intracapsular fracture of unspecified femur, initial encounter for closed fracture: Secondary | ICD-10-CM | POA: Diagnosis not present

## 2014-08-26 DIAGNOSIS — S72019A Unspecified intracapsular fracture of unspecified femur, initial encounter for closed fracture: Secondary | ICD-10-CM | POA: Diagnosis not present

## 2014-08-27 DIAGNOSIS — S72019A Unspecified intracapsular fracture of unspecified femur, initial encounter for closed fracture: Secondary | ICD-10-CM | POA: Diagnosis not present

## 2014-08-28 DIAGNOSIS — S72019A Unspecified intracapsular fracture of unspecified femur, initial encounter for closed fracture: Secondary | ICD-10-CM | POA: Diagnosis not present

## 2014-08-29 DIAGNOSIS — S72019A Unspecified intracapsular fracture of unspecified femur, initial encounter for closed fracture: Secondary | ICD-10-CM | POA: Diagnosis not present

## 2014-08-30 DIAGNOSIS — S72019A Unspecified intracapsular fracture of unspecified femur, initial encounter for closed fracture: Secondary | ICD-10-CM | POA: Diagnosis not present

## 2014-08-31 DIAGNOSIS — S72019A Unspecified intracapsular fracture of unspecified femur, initial encounter for closed fracture: Secondary | ICD-10-CM | POA: Diagnosis not present

## 2014-09-01 DIAGNOSIS — S72019A Unspecified intracapsular fracture of unspecified femur, initial encounter for closed fracture: Secondary | ICD-10-CM | POA: Diagnosis not present

## 2014-09-02 ENCOUNTER — Other Ambulatory Visit: Payer: Self-pay

## 2014-09-02 DIAGNOSIS — S72019A Unspecified intracapsular fracture of unspecified femur, initial encounter for closed fracture: Secondary | ICD-10-CM | POA: Diagnosis not present

## 2014-09-02 MED ORDER — GLUCOSE BLOOD VI STRP: ORAL_STRIP | Status: DC

## 2014-09-03 DIAGNOSIS — S72019A Unspecified intracapsular fracture of unspecified femur, initial encounter for closed fracture: Secondary | ICD-10-CM | POA: Diagnosis not present

## 2014-09-04 DIAGNOSIS — S72019A Unspecified intracapsular fracture of unspecified femur, initial encounter for closed fracture: Secondary | ICD-10-CM | POA: Diagnosis not present

## 2014-09-05 DIAGNOSIS — S72019A Unspecified intracapsular fracture of unspecified femur, initial encounter for closed fracture: Secondary | ICD-10-CM | POA: Diagnosis not present

## 2014-09-06 DIAGNOSIS — S72019A Unspecified intracapsular fracture of unspecified femur, initial encounter for closed fracture: Secondary | ICD-10-CM | POA: Diagnosis not present

## 2014-09-07 DIAGNOSIS — S72019A Unspecified intracapsular fracture of unspecified femur, initial encounter for closed fracture: Secondary | ICD-10-CM | POA: Diagnosis not present

## 2014-09-08 DIAGNOSIS — S72019A Unspecified intracapsular fracture of unspecified femur, initial encounter for closed fracture: Secondary | ICD-10-CM | POA: Diagnosis not present

## 2014-09-09 DIAGNOSIS — S72019A Unspecified intracapsular fracture of unspecified femur, initial encounter for closed fracture: Secondary | ICD-10-CM | POA: Diagnosis not present

## 2014-09-10 DIAGNOSIS — S72019A Unspecified intracapsular fracture of unspecified femur, initial encounter for closed fracture: Secondary | ICD-10-CM | POA: Diagnosis not present

## 2014-09-11 DIAGNOSIS — S72019A Unspecified intracapsular fracture of unspecified femur, initial encounter for closed fracture: Secondary | ICD-10-CM | POA: Diagnosis not present

## 2014-09-12 DIAGNOSIS — S72019A Unspecified intracapsular fracture of unspecified femur, initial encounter for closed fracture: Secondary | ICD-10-CM | POA: Diagnosis not present

## 2014-09-13 DIAGNOSIS — S72019A Unspecified intracapsular fracture of unspecified femur, initial encounter for closed fracture: Secondary | ICD-10-CM | POA: Diagnosis not present

## 2014-09-14 DIAGNOSIS — S72019A Unspecified intracapsular fracture of unspecified femur, initial encounter for closed fracture: Secondary | ICD-10-CM | POA: Diagnosis not present

## 2014-09-15 DIAGNOSIS — S72019A Unspecified intracapsular fracture of unspecified femur, initial encounter for closed fracture: Secondary | ICD-10-CM | POA: Diagnosis not present

## 2014-09-16 ENCOUNTER — Ambulatory Visit (INDEPENDENT_AMBULATORY_CARE_PROVIDER_SITE_OTHER): Payer: Medicare Other | Admitting: Internal Medicine

## 2014-09-16 ENCOUNTER — Other Ambulatory Visit: Payer: Self-pay

## 2014-09-16 ENCOUNTER — Encounter: Payer: Self-pay | Admitting: Internal Medicine

## 2014-09-16 VITALS — BP 124/76 | HR 62 | Temp 97.4°F | Ht 62.0 in | Wt 167.0 lb

## 2014-09-16 DIAGNOSIS — I350 Nonrheumatic aortic (valve) stenosis: Secondary | ICD-10-CM

## 2014-09-16 DIAGNOSIS — E119 Type 2 diabetes mellitus without complications: Secondary | ICD-10-CM

## 2014-09-16 DIAGNOSIS — I10 Essential (primary) hypertension: Secondary | ICD-10-CM

## 2014-09-16 DIAGNOSIS — S72019A Unspecified intracapsular fracture of unspecified femur, initial encounter for closed fracture: Secondary | ICD-10-CM | POA: Diagnosis not present

## 2014-09-16 DIAGNOSIS — E785 Hyperlipidemia, unspecified: Secondary | ICD-10-CM | POA: Diagnosis not present

## 2014-09-16 MED ORDER — POTASSIUM CHLORIDE CRYS ER 20 MEQ PO TBCR
20.0000 meq | EXTENDED_RELEASE_TABLET | Freq: Two times a day (BID) | ORAL | Status: DC
Start: 2014-09-16 — End: 2016-08-30

## 2014-09-16 NOTE — Assessment & Plan Note (Signed)
stable overall by history and exam, recent data reviewed with pt, and pt to continue medical treatment as before,  to f/u any worsening symptoms or concerns BP Readings from Last 3 Encounters:  09/16/14 124/76  06/16/14 128/82  05/08/14 136/64

## 2014-09-16 NOTE — Patient Instructions (Signed)
Please continue all other medications as before, and refills have been done if requested.  Please have the pharmacy call with any other refills you may need.  Please continue your efforts at being more active, low cholesterol diet, and weight control.  You are otherwise up to date with prevention measures today.  Please keep your appointments with your specialists as you may have planned  You will be contacted regarding the referral for: Echocardiogram to look closer at the aortic valve  No blood work needed today  Please return in 3 months, or sooner if needed

## 2014-09-16 NOTE — Assessment & Plan Note (Signed)
stable overall by history and exam, recent data reviewed with pt, and pt to continue medical treatment as before,  to f/u any worsening symptoms or concerns Lab Results  Component Value Date   HGBA1C 6.1 06/16/2014

## 2014-09-16 NOTE — Assessment & Plan Note (Signed)
stable overall by history and exam, recent data reviewed with pt, and pt to continue medical treatment as before,  to f/u any worsening symptoms or concerns Lab Results  Component Value Date   LDLCALC 71 12/11/2013

## 2014-09-16 NOTE — Progress Notes (Signed)
Subjective:    Patient ID: Katherine [name], female    DOB: [birth year], 69 y.o.   MRN: [id number]  HPI  Here to f/u; overall doing ok,  Pt denies chest pain, increasing sob or doe, wheezing, orthopnea, PND, increased LE swelling, palpitations, or syncope but does have occas dizziness. No falls  Has known mild AS from echo 2014, normal EF.[name]  Pt denies new neurological symptoms such as new headache, or facial or extremity weakness or numbness.  Pt denies polydipsia, polyuria, or low sugar episode.   Pt denies new neurological symptoms such as new headache, or facial or extremity weakness or numbness.   Pt states overall good compliance with meds, mostly trying to follow appropriate diet, with wt overall stable,  but little exercise however.  Past Medical History  Diagnosis Date  . ABDOMINAL PAIN, LOWER 02/04/2010  . ABNORMAL THYROID FUNCTION TESTS 08/04/2009  . ALLERGIC RHINITIS 09/05/2006  . ANXIETY 07/31/2007  . DEPRESSION 09/05/2006  . DIVERTICULOSIS, COLON 09/05/2006  . Dysuria 04/15/2010  . Failure to thrive in childhood 04/06/2010  . FATIGUE 04/15/2010  . FIBROMYALGIA 05/10/2009  . FRACTURE, PELVIS, RIGHT 04/15/2010  . GASTROENTERITIS, ACUTE 12/25/2008  . GERD 09/05/2006  . HYPERLIPIDEMIA 09/05/2006  . HYPERTENSION 09/05/2006  . KNEE PAIN, LEFT 02/04/2010  . OSTEOARTHRITIS 09/05/2006  . OSTEOPOROSIS 07/31/2007  . Other specified forms of hearing loss 11/01/2009  . SINUSITIS- ACUTE-NOS 10/09/2007  . SKIN LESION 05/10/2009  . SPRAIN&STRAIN OTH SPEC SITES SHOULDER&UPPER ARM 11/01/2009  . Unspecified hearing loss 10/09/2007  . URI 02/20/2007  . UTI 02/20/2007  . Blood transfusion   . Cataract     resolved -surgery done  . Heart murmur   . DIABETES MELLITUS, TYPE II 07/31/2007    no meds   Past Surgical History  Procedure Laterality Date  . Tonsillectomy    . Breast biopsy    . Abdominal hysterectomy  12/08    Bladder tac, partial hysterectomy  . Inguinal herniorrhapy      right Dr. Freida Busman 06/2009  . Catarct  removal      both eyes  . Total knee arthroplasty Left 12/20/2012    Procedure: LEFT TOTAL KNEE ARTHROPLASTY;  Surgeon: Kathryne Hitch, MD;  Location: [facility];  Service: Orthopedics;  Laterality: Left;  . I&d knee with poly exchange Left 01/03/2013    Procedure: IRRIGATION AND DEBRIDEMENT LEFT KNEE WITH POLY EXCHANGE;  Surgeon: Kathryne Hitch, MD;  Location: [facility];  Service: Orthopedics;  Laterality: Left;    reports that she has never smoked. She does not have any smokeless tobacco history on file. She reports that she does not drink alcohol or use illicit drugs. family history includes Coronary artery disease in her other; Diabetes in her other. There is no history of Colon cancer, Esophageal cancer, Stomach cancer, or Rectal cancer. Allergies  Allergen Reactions  . Penicillins     "made me nervous"  . Thioridazine Hcl Itching   Current Outpatient Prescriptions on File Prior to Visit  Medication Sig Dispense Refill  . albuterol (PROVENTIL HFA;VENTOLIN HFA) 108 (90 BASE) MCG/ACT inhaler Inhale 2 puffs into the lungs every 6 (six) hours as needed for wheezing or shortness of breath. 8.5 g 5  . alendronate (FOSAMAX) 70 MG tablet Take 1 tablet (70 mg total) by mouth every 7 (seven) days. Take with a full glass of water on an empty stomach. 4 tablet 11  . amLODipine (NORVASC) 10 MG tablet Take 0.5 tablets (5 mg total) by  mouth every morning. 90 tablet 3  . aspirin EC 325 MG EC tablet Take 1 tablet (325 mg total) by mouth 2 (two) times daily after a meal. 45 tablet 0  . atorvastatin (LIPITOR) 40 MG tablet Take 1 tablet (40 mg total) by mouth every morning. 90 tablet 3  . azelastine (ASTELIN) 0.1 % nasal spray Place 1 spray into both nostrils 2 (two) times daily. Use in each nostril as directed 30 mL 11  . benztropine (COGENTIN) 0.5 MG tablet Take 0.5 mg by mouth 2 (two) times daily.      . budesonide (RHINOCORT AQUA) 32 MCG/ACT nasal spray Place 2 sprays into both nostrils daily.  8.6 g 5  . busPIRone (BUSPAR) 10 MG tablet Take 1 tablet (10 mg total) by mouth 3 (three) times daily. 60 tablet 11  . calcium-vitamin D (OSCAL WITH D) 500-200 MG-UNIT per tablet Take 1 tablet by mouth 3 (three) times daily. 270 tablet 3  . cetirizine (ZYRTEC) 10 MG tablet TAKE 1 TABLET BY MOUTH DAILY 30 tablet 11  . diclofenac sodium (VOLTAREN) 1 % GEL Apply topically 4 (four) times daily.    . Diclofenac Sodium 1.5 % SOLN     . etodolac (LODINE) 400 MG tablet Take 400 mg by mouth 2 (two) times daily.    . fluticasone (FLONASE) 50 MCG/ACT nasal spray Place 2 sprays into both nostrils daily. 16 g 5  . glucose blood (ONETOUCH VERIO) test strip Use to check blood sugar twice a day Dx E11.9 180 each 3  . hydrOXYzine (ATARAX/VISTARIL) 10 MG tablet Take 1 tablet (10 mg total) by mouth 2 (two) times daily as needed. 30 tablet 11  . ipratropium (ATROVENT) 0.06 % nasal spray Place 1 spray into both nostrils 3 (three) times daily. 45 mL 3  . omeprazole (PRILOSEC) 20 MG capsule Take 1 capsule (20 mg total) by mouth daily. 90 capsule 1  . ondansetron (ZOFRAN) 4 MG tablet Take 1 tablet (4 mg total) by mouth every 8 (eight) hours as needed for nausea or vomiting. 40 tablet 1  . ONE TOUCH LANCETS MISC Use as directed once daily to check blood sugar.  Diagnosis code 250.00 200 each 11  . pantoprazole (PROTONIX) 40 MG tablet Take 1 tablet (40 mg total) by mouth daily. 90 tablet 3  . pregabalin (LYRICA) 75 MG capsule Take 1 capsule (75 mg total) by mouth 2 (two) times daily. 60 capsule 5  . promethazine (PHENERGAN) 12.5 MG tablet TAKE 1 TABLET BY MOUTH EVERY 8 HOURS AS NEEDED FOR NAUSEA AND VOMITING 40 tablet 0  . risperiDONE (RISPERDAL) 3 MG tablet Take 3 mg by mouth at bedtime.     [name] tiZANidine (ZANAFLEX) 4 MG tablet Take 1 tablet (4 mg total) by mouth every 6 (six) hours as needed for muscle spasms. 40 tablet 11  . triamcinolone (NASACORT AQ) 55 MCG/ACT AERO nasal inhaler Place 2 sprays into the nose daily. 3  Inhaler 3   No current facility-administered medications on file prior to visit.   Review of Systems  Constitutional: Negative for unusual diaphoresis or night sweats HENT: Negative for ringing in ear or discharge Eyes: Negative for double vision or worsening visual disturbance.  Respiratory: Negative for choking and stridor.   Gastrointestinal: Negative for vomiting or other signifcant bowel change Genitourinary: Negative for hematuria or change in urine volume.  Musculoskeletal: Negative for other MSK pain or swelling Skin: Negative for color change and worsening wound.  Neurological: Negative for tremors and numbness  other than noted  Psychiatric/Behavioral: Negative for decreased concentration or agitation other than above       Objective:   Physical Exam BP 124/76 mmHg  Pulse 62  Temp(Src) 97.4 F (36.3 C) (Oral)  Ht 5\' 2"  (1.575 m)  Wt 167 lb (75.751 kg)  BMI 30.54 kg/m2  SpO2 96% VS noted,  Constitutional: Pt appears in no significant distress HENT: Head: NCAT.  Right Ear: External ear normal.  Left Ear: External ear normal.  Eyes: . Pupils are equal, round, and reactive to light. Conjunctivae and EOM are normal Neck: Normal range of motion. Neck supple.  Cardiovascular: Normal rate and regular rhythm.  with gr 2/6 SEM rusb Pulmonary/Chest: Effort normal and breath sounds without rales or wheezing.  Neurological: Pt is alert. Not confused , motor grossly intact Skin: Skin is warm. No rash, no LE edema Psychiatric: Pt behavior is normal. No agitation.      Assessment & Plan:

## 2014-09-16 NOTE — Assessment & Plan Note (Signed)
With occas dizziness, ? Worsening murmur, for repeat ECHo to assess valve and EF

## 2014-09-17 DIAGNOSIS — S72019A Unspecified intracapsular fracture of unspecified femur, initial encounter for closed fracture: Secondary | ICD-10-CM | POA: Diagnosis not present

## 2014-09-18 DIAGNOSIS — S72019A Unspecified intracapsular fracture of unspecified femur, initial encounter for closed fracture: Secondary | ICD-10-CM | POA: Diagnosis not present

## 2014-09-19 DIAGNOSIS — S72019A Unspecified intracapsular fracture of unspecified femur, initial encounter for closed fracture: Secondary | ICD-10-CM | POA: Diagnosis not present

## 2014-09-20 DIAGNOSIS — S72019A Unspecified intracapsular fracture of unspecified femur, initial encounter for closed fracture: Secondary | ICD-10-CM | POA: Diagnosis not present

## 2014-09-21 DIAGNOSIS — S72019A Unspecified intracapsular fracture of unspecified femur, initial encounter for closed fracture: Secondary | ICD-10-CM | POA: Diagnosis not present

## 2014-09-22 DIAGNOSIS — S72019A Unspecified intracapsular fracture of unspecified femur, initial encounter for closed fracture: Secondary | ICD-10-CM | POA: Diagnosis not present

## 2014-09-23 DIAGNOSIS — S72019A Unspecified intracapsular fracture of unspecified femur, initial encounter for closed fracture: Secondary | ICD-10-CM | POA: Diagnosis not present

## 2014-09-24 DIAGNOSIS — S72019A Unspecified intracapsular fracture of unspecified femur, initial encounter for closed fracture: Secondary | ICD-10-CM | POA: Diagnosis not present

## 2014-09-25 DIAGNOSIS — S72019A Unspecified intracapsular fracture of unspecified femur, initial encounter for closed fracture: Secondary | ICD-10-CM | POA: Diagnosis not present

## 2014-09-26 DIAGNOSIS — S72019A Unspecified intracapsular fracture of unspecified femur, initial encounter for closed fracture: Secondary | ICD-10-CM | POA: Diagnosis not present

## 2014-09-27 DIAGNOSIS — S72019A Unspecified intracapsular fracture of unspecified femur, initial encounter for closed fracture: Secondary | ICD-10-CM | POA: Diagnosis not present

## 2014-09-28 ENCOUNTER — Ambulatory Visit: Payer: Medicare Other | Admitting: Infectious Disease

## 2014-09-28 DIAGNOSIS — S72019A Unspecified intracapsular fracture of unspecified femur, initial encounter for closed fracture: Secondary | ICD-10-CM | POA: Diagnosis not present

## 2014-09-29 ENCOUNTER — Other Ambulatory Visit: Payer: Self-pay

## 2014-09-29 ENCOUNTER — Ambulatory Visit (HOSPITAL_COMMUNITY): Payer: Medicare Other | Attending: Cardiology

## 2014-09-29 DIAGNOSIS — Z6834 Body mass index (BMI) 34.0-34.9, adult: Secondary | ICD-10-CM | POA: Insufficient documentation

## 2014-09-29 DIAGNOSIS — I371 Nonrheumatic pulmonary valve insufficiency: Secondary | ICD-10-CM | POA: Diagnosis not present

## 2014-09-29 DIAGNOSIS — E785 Hyperlipidemia, unspecified: Secondary | ICD-10-CM | POA: Insufficient documentation

## 2014-09-29 DIAGNOSIS — I071 Rheumatic tricuspid insufficiency: Secondary | ICD-10-CM | POA: Diagnosis not present

## 2014-09-29 DIAGNOSIS — E669 Obesity, unspecified: Secondary | ICD-10-CM | POA: Insufficient documentation

## 2014-09-29 DIAGNOSIS — I350 Nonrheumatic aortic (valve) stenosis: Secondary | ICD-10-CM

## 2014-09-29 DIAGNOSIS — S72019A Unspecified intracapsular fracture of unspecified femur, initial encounter for closed fracture: Secondary | ICD-10-CM | POA: Diagnosis not present

## 2014-09-29 DIAGNOSIS — E119 Type 2 diabetes mellitus without complications: Secondary | ICD-10-CM | POA: Insufficient documentation

## 2014-09-29 DIAGNOSIS — I10 Essential (primary) hypertension: Secondary | ICD-10-CM | POA: Insufficient documentation

## 2014-09-30 ENCOUNTER — Encounter: Payer: Self-pay | Admitting: Internal Medicine

## 2014-09-30 DIAGNOSIS — S72019A Unspecified intracapsular fracture of unspecified femur, initial encounter for closed fracture: Secondary | ICD-10-CM | POA: Diagnosis not present

## 2014-10-01 DIAGNOSIS — S72019A Unspecified intracapsular fracture of unspecified femur, initial encounter for closed fracture: Secondary | ICD-10-CM | POA: Diagnosis not present

## 2014-10-02 DIAGNOSIS — S72019A Unspecified intracapsular fracture of unspecified femur, initial encounter for closed fracture: Secondary | ICD-10-CM | POA: Diagnosis not present

## 2014-10-03 DIAGNOSIS — S72019A Unspecified intracapsular fracture of unspecified femur, initial encounter for closed fracture: Secondary | ICD-10-CM | POA: Diagnosis not present

## 2014-10-04 DIAGNOSIS — S72019A Unspecified intracapsular fracture of unspecified femur, initial encounter for closed fracture: Secondary | ICD-10-CM | POA: Diagnosis not present

## 2014-10-05 DIAGNOSIS — S72019A Unspecified intracapsular fracture of unspecified femur, initial encounter for closed fracture: Secondary | ICD-10-CM | POA: Diagnosis not present

## 2014-10-06 DIAGNOSIS — S72019A Unspecified intracapsular fracture of unspecified femur, initial encounter for closed fracture: Secondary | ICD-10-CM | POA: Diagnosis not present

## 2014-10-07 ENCOUNTER — Ambulatory Visit: Payer: Medicare Other | Admitting: Infectious Disease

## 2014-10-07 DIAGNOSIS — S72019A Unspecified intracapsular fracture of unspecified femur, initial encounter for closed fracture: Secondary | ICD-10-CM | POA: Diagnosis not present

## 2014-10-08 DIAGNOSIS — S72019A Unspecified intracapsular fracture of unspecified femur, initial encounter for closed fracture: Secondary | ICD-10-CM | POA: Diagnosis not present

## 2014-10-09 DIAGNOSIS — S72019A Unspecified intracapsular fracture of unspecified femur, initial encounter for closed fracture: Secondary | ICD-10-CM | POA: Diagnosis not present

## 2014-10-10 DIAGNOSIS — S72019A Unspecified intracapsular fracture of unspecified femur, initial encounter for closed fracture: Secondary | ICD-10-CM | POA: Diagnosis not present

## 2014-10-11 DIAGNOSIS — S72019A Unspecified intracapsular fracture of unspecified femur, initial encounter for closed fracture: Secondary | ICD-10-CM | POA: Diagnosis not present

## 2014-10-12 DIAGNOSIS — S72019A Unspecified intracapsular fracture of unspecified femur, initial encounter for closed fracture: Secondary | ICD-10-CM | POA: Diagnosis not present

## 2014-10-13 DIAGNOSIS — S72019A Unspecified intracapsular fracture of unspecified femur, initial encounter for closed fracture: Secondary | ICD-10-CM | POA: Diagnosis not present

## 2014-10-14 DIAGNOSIS — S72019A Unspecified intracapsular fracture of unspecified femur, initial encounter for closed fracture: Secondary | ICD-10-CM | POA: Diagnosis not present

## 2014-10-15 DIAGNOSIS — S72019A Unspecified intracapsular fracture of unspecified femur, initial encounter for closed fracture: Secondary | ICD-10-CM | POA: Diagnosis not present

## 2014-10-16 DIAGNOSIS — S72019A Unspecified intracapsular fracture of unspecified femur, initial encounter for closed fracture: Secondary | ICD-10-CM | POA: Diagnosis not present

## 2014-10-17 DIAGNOSIS — S72019A Unspecified intracapsular fracture of unspecified femur, initial encounter for closed fracture: Secondary | ICD-10-CM | POA: Diagnosis not present

## 2014-10-18 DIAGNOSIS — S72019A Unspecified intracapsular fracture of unspecified femur, initial encounter for closed fracture: Secondary | ICD-10-CM | POA: Diagnosis not present

## 2014-10-19 DIAGNOSIS — S72019A Unspecified intracapsular fracture of unspecified femur, initial encounter for closed fracture: Secondary | ICD-10-CM | POA: Diagnosis not present

## 2014-10-20 DIAGNOSIS — S72019A Unspecified intracapsular fracture of unspecified femur, initial encounter for closed fracture: Secondary | ICD-10-CM | POA: Diagnosis not present

## 2014-10-21 DIAGNOSIS — S72019A Unspecified intracapsular fracture of unspecified femur, initial encounter for closed fracture: Secondary | ICD-10-CM | POA: Diagnosis not present

## 2014-10-22 ENCOUNTER — Telehealth: Payer: Self-pay | Admitting: *Deleted

## 2014-10-22 DIAGNOSIS — S72019A Unspecified intracapsular fracture of unspecified femur, initial encounter for closed fracture: Secondary | ICD-10-CM | POA: Diagnosis not present

## 2014-10-22 MED ORDER — GLUCOSE BLOOD VI STRP: 1.0000 | ORAL_STRIP | Freq: Two times a day (BID) | Status: DC

## 2014-10-22 MED ORDER — ONETOUCH LANCETS MISC: Status: DC

## 2014-10-22 NOTE — Telephone Encounter (Signed)
Left msg on triage stating needing rx for one touch verio strips & lancets. Notified pt rx af to [facility].....Raechel Chute

## 2014-10-23 DIAGNOSIS — S72019A Unspecified intracapsular fracture of unspecified femur, initial encounter for closed fracture: Secondary | ICD-10-CM | POA: Diagnosis not present

## 2014-10-24 DIAGNOSIS — S72019A Unspecified intracapsular fracture of unspecified femur, initial encounter for closed fracture: Secondary | ICD-10-CM | POA: Diagnosis not present

## 2014-10-25 DIAGNOSIS — S72019A Unspecified intracapsular fracture of unspecified femur, initial encounter for closed fracture: Secondary | ICD-10-CM | POA: Diagnosis not present

## 2014-10-26 ENCOUNTER — Ambulatory Visit (INDEPENDENT_AMBULATORY_CARE_PROVIDER_SITE_OTHER): Payer: Medicare Other | Admitting: Infectious Disease

## 2014-10-26 ENCOUNTER — Encounter: Payer: Self-pay | Admitting: Infectious Disease

## 2014-10-26 VITALS — BP 149/84 | HR 67 | Temp 98.4°F | Ht 58.5 in | Wt 174.0 lb

## 2014-10-26 DIAGNOSIS — T8459XD Infection and inflammatory reaction due to other internal joint prosthesis, subsequent encounter: Secondary | ICD-10-CM

## 2014-10-26 DIAGNOSIS — E08621 Diabetes mellitus due to underlying condition with foot ulcer: Secondary | ICD-10-CM

## 2014-10-26 DIAGNOSIS — T8450XD Infection and inflammatory reaction due to unspecified internal joint prosthesis, subsequent encounter: Secondary | ICD-10-CM

## 2014-10-26 DIAGNOSIS — E11621 Type 2 diabetes mellitus with foot ulcer: Secondary | ICD-10-CM

## 2014-10-26 DIAGNOSIS — A4902 Methicillin resistant Staphylococcus aureus infection, unspecified site: Secondary | ICD-10-CM | POA: Diagnosis not present

## 2014-10-26 DIAGNOSIS — L97509 Non-pressure chronic ulcer of other part of unspecified foot with unspecified severity: Secondary | ICD-10-CM

## 2014-10-26 DIAGNOSIS — Z96659 Presence of unspecified artificial knee joint: Principal | ICD-10-CM

## 2014-10-26 DIAGNOSIS — S72019A Unspecified intracapsular fracture of unspecified femur, initial encounter for closed fracture: Secondary | ICD-10-CM | POA: Diagnosis not present

## 2014-10-26 HISTORY — DX: Methicillin resistant Staphylococcus aureus infection, unspecified site: A49.02

## 2014-10-26 HISTORY — DX: Type 2 diabetes mellitus with foot ulcer: E11.621

## 2014-10-26 LAB — C-REACTIVE PROTEIN
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
## 2014-10-27 DIAGNOSIS — S72019A Unspecified intracapsular fracture of unspecified femur, initial encounter for closed fracture: Secondary | ICD-10-CM | POA: Diagnosis not present

## 2014-10-27 LAB — SEDIMENTATION RATE: Sed Rate: 1 mm/hr (ref 0–30)

## 2014-10-28 DIAGNOSIS — S72019A Unspecified intracapsular fracture of unspecified femur, initial encounter for closed fracture: Secondary | ICD-10-CM | POA: Diagnosis not present

## 2014-10-29 ENCOUNTER — Other Ambulatory Visit: Payer: Self-pay

## 2014-10-29 DIAGNOSIS — S72019A Unspecified intracapsular fracture of unspecified femur, initial encounter for closed fracture: Secondary | ICD-10-CM | POA: Diagnosis not present

## 2014-10-29 MED ORDER — ONDANSETRON HCL 4 MG PO TABS: 4.0000 mg | ORAL_TABLET | Freq: Three times a day (TID) | ORAL | Status: DC | PRN

## 2014-10-30 DIAGNOSIS — S72019A Unspecified intracapsular fracture of unspecified femur, initial encounter for closed fracture: Secondary | ICD-10-CM | POA: Diagnosis not present

## 2014-10-31 DIAGNOSIS — S72019A Unspecified intracapsular fracture of unspecified femur, initial encounter for closed fracture: Secondary | ICD-10-CM | POA: Diagnosis not present

## 2014-11-01 DIAGNOSIS — S72019A Unspecified intracapsular fracture of unspecified femur, initial encounter for closed fracture: Secondary | ICD-10-CM | POA: Diagnosis not present

## 2014-11-02 DIAGNOSIS — S72019A Unspecified intracapsular fracture of unspecified femur, initial encounter for closed fracture: Secondary | ICD-10-CM | POA: Diagnosis not present

## 2014-11-03 DIAGNOSIS — S72019A Unspecified intracapsular fracture of unspecified femur, initial encounter for closed fracture: Secondary | ICD-10-CM | POA: Diagnosis not present

## 2014-11-04 DIAGNOSIS — S72019A Unspecified intracapsular fracture of unspecified femur, initial encounter for closed fracture: Secondary | ICD-10-CM | POA: Diagnosis not present

## 2014-11-05 DIAGNOSIS — S72019A Unspecified intracapsular fracture of unspecified femur, initial encounter for closed fracture: Secondary | ICD-10-CM | POA: Diagnosis not present

## 2014-11-06 DIAGNOSIS — S72019A Unspecified intracapsular fracture of unspecified femur, initial encounter for closed fracture: Secondary | ICD-10-CM | POA: Diagnosis not present

## 2014-11-07 DIAGNOSIS — S72019A Unspecified intracapsular fracture of unspecified femur, initial encounter for closed fracture: Secondary | ICD-10-CM | POA: Diagnosis not present

## 2014-11-08 DIAGNOSIS — S72019A Unspecified intracapsular fracture of unspecified femur, initial encounter for closed fracture: Secondary | ICD-10-CM | POA: Diagnosis not present

## 2014-11-09 DIAGNOSIS — S72019A Unspecified intracapsular fracture of unspecified femur, initial encounter for closed fracture: Secondary | ICD-10-CM | POA: Diagnosis not present

## 2014-11-10 DIAGNOSIS — S72019A Unspecified intracapsular fracture of unspecified femur, initial encounter for closed fracture: Secondary | ICD-10-CM | POA: Diagnosis not present

## 2014-11-11 DIAGNOSIS — S72019A Unspecified intracapsular fracture of unspecified femur, initial encounter for closed fracture: Secondary | ICD-10-CM | POA: Diagnosis not present

## 2014-11-12 DIAGNOSIS — S72019A Unspecified intracapsular fracture of unspecified femur, initial encounter for closed fracture: Secondary | ICD-10-CM | POA: Diagnosis not present

## 2014-11-13 ENCOUNTER — Telehealth: Payer: Self-pay | Admitting: Internal Medicine

## 2014-11-13 DIAGNOSIS — S72019A Unspecified intracapsular fracture of unspecified femur, initial encounter for closed fracture: Secondary | ICD-10-CM | POA: Diagnosis not present

## 2014-11-14 DIAGNOSIS — S72019A Unspecified intracapsular fracture of unspecified femur, initial encounter for closed fracture: Secondary | ICD-10-CM | POA: Diagnosis not present

## 2014-11-15 DIAGNOSIS — S72019A Unspecified intracapsular fracture of unspecified femur, initial encounter for closed fracture: Secondary | ICD-10-CM | POA: Diagnosis not present

## 2014-11-16 DIAGNOSIS — S72019A Unspecified intracapsular fracture of unspecified femur, initial encounter for closed fracture: Secondary | ICD-10-CM | POA: Diagnosis not present

## 2014-11-17 ENCOUNTER — Telehealth: Payer: Self-pay | Admitting: Internal Medicine

## 2014-11-17 DIAGNOSIS — S72019A Unspecified intracapsular fracture of unspecified femur, initial encounter for closed fracture: Secondary | ICD-10-CM | POA: Diagnosis not present

## 2014-11-17 MED ORDER — PANTOPRAZOLE SODIUM 40 MG PO TBEC: 40.0000 mg | DELAYED_RELEASE_TABLET | Freq: Every day | ORAL | Status: DC

## 2014-11-17 NOTE — Telephone Encounter (Signed)
Pt request refill for Pantoprazole 40 mg to be send to [facility]. Pt also request if there is any other med that is due to be refill please send it in as well. Please help

## 2014-11-18 ENCOUNTER — Telehealth: Payer: Self-pay | Admitting: Internal Medicine

## 2014-11-18 DIAGNOSIS — S72019A Unspecified intracapsular fracture of unspecified femur, initial encounter for closed fracture: Secondary | ICD-10-CM | POA: Diagnosis not present

## 2014-11-18 NOTE — Telephone Encounter (Signed)
Notified pt md ok rx for protonix has been sent to [facility]...Raechel Chute/lmb

## 2014-11-18 NOTE — Telephone Encounter (Signed)
Patient wants prilosec discontinued, is using and likes protoni

## 2014-11-18 NOTE — Telephone Encounter (Signed)
Patient wants prilosec discontinued, is using and states protonix works best---is it ok for me to discontinue prilosec and send in refill for protonix---we dont usually place patient on both of these at same time but I didn't know if you wanted patient on both of these----please advise, thanks

## 2014-11-18 NOTE — Telephone Encounter (Signed)
Pt also request refill for calcium-vitamin D (OSCAL WITH D) 500-200 MG-UNIT. Please help

## 2014-11-18 NOTE — Telephone Encounter (Signed)
Yes, ok to change prilosec to protonix 40 qd

## 2014-11-18 NOTE — Telephone Encounter (Signed)
Pt called stated she since we sent in refill for Protonix for her, maybe Dr. Jonny RuizJohn can discontinue the omeprazole (PRILOSEC) 20 MG capsule because its not strong enough/per pt.

## 2014-11-19 DIAGNOSIS — S72019A Unspecified intracapsular fracture of unspecified femur, initial encounter for closed fracture: Secondary | ICD-10-CM | POA: Diagnosis not present

## 2014-11-20 ENCOUNTER — Ambulatory Visit: Payer: Medicare Other | Admitting: Podiatry

## 2014-11-20 DIAGNOSIS — S72019A Unspecified intracapsular fracture of unspecified femur, initial encounter for closed fracture: Secondary | ICD-10-CM | POA: Diagnosis not present

## 2014-11-21 DIAGNOSIS — S72019A Unspecified intracapsular fracture of unspecified femur, initial encounter for closed fracture: Secondary | ICD-10-CM | POA: Diagnosis not present

## 2014-11-22 DIAGNOSIS — S72019A Unspecified intracapsular fracture of unspecified femur, initial encounter for closed fracture: Secondary | ICD-10-CM | POA: Diagnosis not present

## 2014-11-23 ENCOUNTER — Other Ambulatory Visit: Payer: Self-pay

## 2014-11-23 DIAGNOSIS — S72019A Unspecified intracapsular fracture of unspecified femur, initial encounter for closed fracture: Secondary | ICD-10-CM | POA: Diagnosis not present

## 2014-11-23 MED ORDER — AZELASTINE HCL 0.1 % NA SOLN: 1.0000 | Freq: Two times a day (BID) | NASAL | Status: DC

## 2014-11-23 MED ORDER — AMLODIPINE BESYLATE 10 MG PO TABS: 5.0000 mg | ORAL_TABLET | Freq: Every morning | ORAL | Status: DC

## 2014-11-24 DIAGNOSIS — S72019A Unspecified intracapsular fracture of unspecified femur, initial encounter for closed fracture: Secondary | ICD-10-CM | POA: Diagnosis not present

## 2014-11-25 DIAGNOSIS — S72019A Unspecified intracapsular fracture of unspecified femur, initial encounter for closed fracture: Secondary | ICD-10-CM | POA: Diagnosis not present

## 2014-11-26 DIAGNOSIS — S72019A Unspecified intracapsular fracture of unspecified femur, initial encounter for closed fracture: Secondary | ICD-10-CM | POA: Diagnosis not present

## 2014-11-27 DIAGNOSIS — S72019A Unspecified intracapsular fracture of unspecified femur, initial encounter for closed fracture: Secondary | ICD-10-CM | POA: Diagnosis not present

## 2014-11-28 DIAGNOSIS — S72019A Unspecified intracapsular fracture of unspecified femur, initial encounter for closed fracture: Secondary | ICD-10-CM | POA: Diagnosis not present

## 2014-11-29 DIAGNOSIS — S72019A Unspecified intracapsular fracture of unspecified femur, initial encounter for closed fracture: Secondary | ICD-10-CM | POA: Diagnosis not present

## 2014-11-30 ENCOUNTER — Ambulatory Visit (INDEPENDENT_AMBULATORY_CARE_PROVIDER_SITE_OTHER): Payer: Medicare Other | Admitting: Podiatry

## 2014-11-30 ENCOUNTER — Encounter: Payer: Self-pay | Admitting: Podiatry

## 2014-11-30 DIAGNOSIS — M79609 Pain in unspecified limb: Secondary | ICD-10-CM

## 2014-11-30 DIAGNOSIS — B351 Tinea unguium: Secondary | ICD-10-CM

## 2014-11-30 DIAGNOSIS — M79673 Pain in unspecified foot: Secondary | ICD-10-CM | POA: Diagnosis not present

## 2014-11-30 DIAGNOSIS — S72019A Unspecified intracapsular fracture of unspecified femur, initial encounter for closed fracture: Secondary | ICD-10-CM | POA: Diagnosis not present

## 2014-12-01 DIAGNOSIS — S72019A Unspecified intracapsular fracture of unspecified femur, initial encounter for closed fracture: Secondary | ICD-10-CM | POA: Diagnosis not present

## 2014-12-01 NOTE — Progress Notes (Signed)
Patient ID: Katherine QueenPamela A Garceau, female   DOB: 09/30/1945, 69 y.o.   MRN: [id number]  Subjective: 69 y.o. returns the office today for painful, elongated, thickened toenails which she cannot trim herself. Denies any redness or drainage around the nails. Denies any acute changes since last appointment and no new complaints today. Denies any systemic complaints such as fevers, chills, nausea, vomiting.   Objective: AAO 3, NAD DP/PT pulses palpable 2/4, CRT less than 3 seconds Nails hypertrophic, dystrophic, elongated, brittle, discolored 10. There is tenderness overlying the nails 1-5 bilaterally. There is no surrounding erythema or drainage along the nail sites. No open lesions or pre-ulcerative lesions are identified. No other areas of tenderness bilateral lower extremities. No overlying edema, erythema, increased warmth. No pain with calf compression, swelling, warmth, erythema.  Assessment: Patient presents with symptomatic onychomycosis  Plan: -Treatment options including alternatives, risks, complications were discussed -Nails sharply debrided 10 without complication/bleeding. -Discussed daily foot inspection. If there are any changes, to call the office immediately.  -Follow-up in 3 months or sooner if any problems are to arise. In the meantime, encouraged to call the office with any questions, concerns, changes symptoms.  Ovid CurdMatthew Wagoner, DPM

## 2014-12-02 DIAGNOSIS — S72019A Unspecified intracapsular fracture of unspecified femur, initial encounter for closed fracture: Secondary | ICD-10-CM | POA: Diagnosis not present

## 2014-12-03 DIAGNOSIS — S72019A Unspecified intracapsular fracture of unspecified femur, initial encounter for closed fracture: Secondary | ICD-10-CM | POA: Diagnosis not present

## 2014-12-06 DIAGNOSIS — S72019A Unspecified intracapsular fracture of unspecified femur, initial encounter for closed fracture: Secondary | ICD-10-CM | POA: Diagnosis not present

## 2014-12-07 DIAGNOSIS — S72019A Unspecified intracapsular fracture of unspecified femur, initial encounter for closed fracture: Secondary | ICD-10-CM | POA: Diagnosis not present

## 2014-12-08 DIAGNOSIS — S72019A Unspecified intracapsular fracture of unspecified femur, initial encounter for closed fracture: Secondary | ICD-10-CM | POA: Diagnosis not present

## 2014-12-09 DIAGNOSIS — S72019A Unspecified intracapsular fracture of unspecified femur, initial encounter for closed fracture: Secondary | ICD-10-CM | POA: Diagnosis not present

## 2014-12-10 DIAGNOSIS — S72019A Unspecified intracapsular fracture of unspecified femur, initial encounter for closed fracture: Secondary | ICD-10-CM | POA: Diagnosis not present

## 2014-12-11 DIAGNOSIS — S72019A Unspecified intracapsular fracture of unspecified femur, initial encounter for closed fracture: Secondary | ICD-10-CM | POA: Diagnosis not present

## 2014-12-12 DIAGNOSIS — S72019A Unspecified intracapsular fracture of unspecified femur, initial encounter for closed fracture: Secondary | ICD-10-CM | POA: Diagnosis not present

## 2014-12-13 DIAGNOSIS — S72019A Unspecified intracapsular fracture of unspecified femur, initial encounter for closed fracture: Secondary | ICD-10-CM | POA: Diagnosis not present

## 2014-12-14 DIAGNOSIS — S72019A Unspecified intracapsular fracture of unspecified femur, initial encounter for closed fracture: Secondary | ICD-10-CM | POA: Diagnosis not present

## 2014-12-15 DIAGNOSIS — S72019A Unspecified intracapsular fracture of unspecified femur, initial encounter for closed fracture: Secondary | ICD-10-CM | POA: Diagnosis not present

## 2014-12-16 ENCOUNTER — Other Ambulatory Visit (INDEPENDENT_AMBULATORY_CARE_PROVIDER_SITE_OTHER): Payer: Medicare Other

## 2014-12-16 ENCOUNTER — Telehealth: Payer: Self-pay | Admitting: Internal Medicine

## 2014-12-16 ENCOUNTER — Ambulatory Visit (INDEPENDENT_AMBULATORY_CARE_PROVIDER_SITE_OTHER): Payer: Medicare Other | Admitting: Internal Medicine

## 2014-12-16 ENCOUNTER — Encounter: Payer: Self-pay | Admitting: Internal Medicine

## 2014-12-16 VITALS — BP 110/72 | HR 61 | Temp 97.9°F | Ht 59.0 in | Wt 177.0 lb

## 2014-12-16 DIAGNOSIS — E785 Hyperlipidemia, unspecified: Secondary | ICD-10-CM | POA: Diagnosis not present

## 2014-12-16 DIAGNOSIS — Z0189 Encounter for other specified special examinations: Secondary | ICD-10-CM | POA: Diagnosis not present

## 2014-12-16 DIAGNOSIS — Z0000 Encounter for general adult medical examination without abnormal findings: Secondary | ICD-10-CM

## 2014-12-16 DIAGNOSIS — E119 Type 2 diabetes mellitus without complications: Secondary | ICD-10-CM

## 2014-12-16 DIAGNOSIS — I10 Essential (primary) hypertension: Secondary | ICD-10-CM | POA: Diagnosis not present

## 2014-12-16 DIAGNOSIS — S72019A Unspecified intracapsular fracture of unspecified femur, initial encounter for closed fracture: Secondary | ICD-10-CM | POA: Diagnosis not present

## 2014-12-16 DIAGNOSIS — Z20828 Contact with and (suspected) exposure to other viral communicable diseases: Secondary | ICD-10-CM

## 2014-12-16 LAB — HEPATIC FUNCTION PANEL
ALBUMIN: 3.9 g/dL (ref 3.5–5.2)
ALK PHOS: 62 U/L (ref 39–117)
ALT: 28 U/L (ref 0–35)
AST: 28 U/L (ref 0–37)
Bilirubin, Direct: 0.2 mg/dL (ref 0.0–0.3)
TOTAL PROTEIN: 6.7 g/dL (ref 6.0–8.3)
Total Bilirubin: 0.6 mg/dL (ref 0.2–1.2)

## 2014-12-16 LAB — BASIC METABOLIC PANEL
BUN: 20 mg/dL (ref 6–23)
CALCIUM: 9.3 mg/dL (ref 8.4–10.5)
CO2: 26 meq/L (ref 19–32)
Chloride: 103 mEq/L (ref 96–112)
Creatinine, Ser: 0.82 mg/dL (ref 0.40–1.20)
GFR: 73.4 mL/min (ref >60.00)
GLUCOSE: 94 mg/dL (ref 70–99)
Potassium: 4.9 mEq/L (ref 3.5–5.1)
SODIUM: 136 meq/L (ref 135–145)

## 2014-12-16 LAB — LIPID PANEL
CHOLESTEROL: 145 mg/dL (ref 0–200)
HDL: 46.8 mg/dL (ref >39.00)
LDL CALC: 69 mg/dL (ref 0–99)
NonHDL: 97.98
TRIGLYCERIDES: 144 mg/dL (ref 0.0–149.0)
Total CHOL/HDL Ratio: 3
VLDL: 28.8 mg/dL (ref 0.0–40.0)

## 2014-12-16 LAB — HEMOGLOBIN A1C: HEMOGLOBIN A1C: 6 % (ref 4.6–6.5)

## 2014-12-16 NOTE — Telephone Encounter (Signed)
Noted  

## 2014-12-16 NOTE — Patient Instructions (Signed)
You had the flu shot today  Please continue all other medications as before, and refills have been done if requested.  Please have the pharmacy call with any other refills you may need.  Please continue your efforts at being more active, low cholesterol diet, and weight control..  Please keep your appointments with your specialists as you may have planned  Please go to the LAB in the Basement (turn left off the elevator) for the tests to be done today  You will be contacted by phone if any changes need to be made immediately.  Otherwise, you will receive a letter about your results with an explanation, but please check with MyChart first.  Please remember to sign up for MyChart if you have not done so, as this will be important to you in the future with finding out test results, communicating by private email, and scheduling acute appointments online when needed.  Please return in 6 months, or sooner if needed, with Lab testing done 3-5 days before   

## 2014-12-16 NOTE — Telephone Encounter (Signed)
Patient was in today for an appointment and she just called to let you know that she did receive her flu shot

## 2014-12-16 NOTE — Progress Notes (Signed)
Subjective:    Patient ID: Katherine [name], female    DOB: 1945/09/12, 69 y.o.   MRN: [id number]  HPI  Here to f/u; overall doing ok,  Pt denies chest pain, increasing sob or doe, wheezing, orthopnea, PND, increased LE swelling, palpitations, dizziness or syncope.  Pt denies new neurological symptoms such as new headache, or facial or extremity weakness or numbness.  Pt denies polydipsia, polyuria, or low sugar episode.   Pt denies new neurological symptoms such as new headache, or facial or extremity weakness or numbness.   Pt states overall good compliance with meds, mostly trying to follow appropriate diet, with wt overall stable,  but little exercise however.  No new complaints Past Medical History  Diagnosis Date  . ABDOMINAL PAIN, LOWER 02/04/2010  . ABNORMAL THYROID FUNCTION TESTS 08/04/2009  . ALLERGIC RHINITIS 09/05/2006  . ANXIETY 07/31/2007  . DEPRESSION 09/05/2006  . DIVERTICULOSIS, COLON 09/05/2006  . Dysuria 04/15/2010  . Failure to thrive in childhood 04/06/2010  . FATIGUE 04/15/2010  . FIBROMYALGIA 05/10/2009  . FRACTURE, PELVIS, RIGHT 04/15/2010  . GASTROENTERITIS, ACUTE 12/25/2008  . GERD 09/05/2006  . HYPERLIPIDEMIA 09/05/2006  . HYPERTENSION 09/05/2006  . KNEE PAIN, LEFT 02/04/2010  . OSTEOARTHRITIS 09/05/2006  . OSTEOPOROSIS 07/31/2007  . Other specified forms of hearing loss 11/01/2009  . SINUSITIS- ACUTE-NOS 10/09/2007  . SKIN LESION 05/10/2009  . SPRAIN&STRAIN OTH SPEC SITES SHOULDER&UPPER ARM 11/01/2009  . Unspecified hearing loss 10/09/2007  . URI 02/20/2007  . UTI 02/20/2007  . Blood transfusion   . Cataract     resolved -surgery done  . Heart murmur   . DIABETES MELLITUS, TYPE II 07/31/2007    no meds  . MRSA infection 10/26/2014  . Diabetic foot ulcer (HCC) 10/26/2014   Past Surgical History  Procedure Laterality Date  . Tonsillectomy    . Breast biopsy    . Abdominal hysterectomy  12/08    Bladder tac, partial hysterectomy  . Inguinal herniorrhapy      right Dr. Freida Busman 06/2009  .  Catarct removal      both eyes  . Total knee arthroplasty Left 12/20/2012    Procedure: LEFT TOTAL KNEE ARTHROPLASTY;  Surgeon: Kathryne Hitch, MD;  Location: [facility];  Service: Orthopedics;  Laterality: Left;  . I&d knee with poly exchange Left 01/03/2013    Procedure: IRRIGATION AND DEBRIDEMENT LEFT KNEE WITH POLY EXCHANGE;  Surgeon: Kathryne Hitch, MD;  Location: [facility];  Service: Orthopedics;  Laterality: Left;    reports that she has never smoked. She does not have any smokeless tobacco history on file. She reports that she does not drink alcohol or use illicit drugs. family history includes Coronary artery disease in her other; Diabetes in her other. There is no history of Colon cancer, Esophageal cancer, Stomach cancer, or Rectal cancer. Allergies  Allergen Reactions  . Penicillins     "made me nervous"  . Thioridazine Hcl Itching   Current Outpatient Prescriptions on File Prior to Visit  Medication Sig Dispense Refill  . albuterol (PROVENTIL HFA;VENTOLIN HFA) 108 (90 BASE) MCG/ACT inhaler Inhale 2 puffs into the lungs every 6 (six) hours as needed for wheezing or shortness of breath. 8.5 g 5  . alendronate (FOSAMAX) 70 MG tablet Take 1 tablet (70 mg total) by mouth every 7 (seven) days. Take with a full glass of water on an empty stomach. 4 tablet 11  . ALPRAZolam (XANAX) 0.5 MG tablet Take 0.5 mg by mouth 2 (two) times  daily as needed for anxiety.    [name]. amLODipine (NORVASC) 10 MG tablet Take 0.5 tablets (5 mg total) by mouth every morning. 90 tablet 3  . aspirin EC 325 MG EC tablet Take 1 tablet (325 mg total) by mouth 2 (two) times daily after a meal. 45 tablet 0  . atorvastatin (LIPITOR) 40 MG tablet Take 1 tablet (40 mg total) by mouth every morning. 90 tablet 3  . azelastine (ASTELIN) 0.1 % nasal spray Place 1 spray into both nostrils 2 (two) times daily. Use in each nostril as directed 30 mL 11  . benztropine (COGENTIN) 0.5 MG tablet Take 0.5 mg by mouth 2 (two)  times daily.      . budesonide (RHINOCORT AQUA) 32 MCG/ACT nasal spray Place 2 sprays into both nostrils daily. 8.6 g 5  . busPIRone (BUSPAR) 10 MG tablet Take 1 tablet (10 mg total) by mouth 3 (three) times daily. 60 tablet 11  . calcium-vitamin D (OSCAL WITH D) 500-200 MG-UNIT per tablet Take 1 tablet by mouth 3 (three) times daily. 270 tablet 3  . cetirizine (ZYRTEC) 10 MG tablet TAKE 1 TABLET BY MOUTH DAILY 30 tablet 11  . diclofenac sodium (VOLTAREN) 1 % GEL Apply topically 4 (four) times daily.    . Diclofenac Sodium 1.5 % SOLN     . etodolac (LODINE) 400 MG tablet Take 400 mg by mouth 2 (two) times daily.    . fluticasone (FLONASE) 50 MCG/ACT nasal spray Place 2 sprays into both nostrils daily. 16 g 5  . glucose blood (ONETOUCH VERIO) test strip 1 each by Other route 2 (two) times daily. Use to check blood sugars twice a day Dx E11.9    . glucose blood (ONETOUCH VERIO) test strip 1 each by Other route 2 (two) times daily. Use to check blood sugar twice a day Dx E11.9 180 each 3  . hydrOXYzine (ATARAX/VISTARIL) 10 MG tablet Take 1 tablet (10 mg total) by mouth 2 (two) times daily as needed. 30 tablet 11  . ipratropium (ATROVENT) 0.06 % nasal spray Place 1 spray into both nostrils 3 (three) times daily. 45 mL 3  . ondansetron (ZOFRAN) 4 MG tablet Take 1 tablet (4 mg total) by mouth every 8 (eight) hours as needed for nausea or vomiting. 40 tablet 1  . ONE TOUCH LANCETS MISC Use as directed once daily to check blood sugar.  Diagnosis code 250.00 200 each 11  . ONE TOUCH LANCETS MISC Use to check blood sugars twice a day Dx E11.9 200 each 1  . pantoprazole (PROTONIX) 40 MG tablet Take 1 tablet (40 mg total) by mouth daily. 90 tablet 3  . potassium chloride SA (K-DUR,KLOR-CON) 20 MEQ tablet Take 1 tablet (20 mEq total) by mouth 2 (two) times daily. 180 tablet 3  . pregabalin (LYRICA) 75 MG capsule Take 1 capsule (75 mg total) by mouth 2 (two) times daily. 60 capsule 5  . promethazine  (PHENERGAN) 12.5 MG tablet TAKE 1 TABLET BY MOUTH EVERY 8 HOURS AS NEEDED FOR NAUSEA AND VOMITING 40 tablet 0  . risperiDONE (RISPERDAL) 3 MG tablet Take 3 mg by mouth at bedtime.     . triamcinolone (NASACORT AQ) 55 MCG/ACT AERO nasal inhaler Place 2 sprays into the nose daily. 3 Inhaler 3   No current facility-administered medications on file prior to visit.   Review of Systems  Constitutional: Negative for unusual diaphoresis or night sweats HENT: Negative for ringing in ear or discharge Eyes: Negative for double  vision or worsening visual disturbance.  Respiratory: Negative for choking and stridor.   Gastrointestinal: Negative for vomiting or other signifcant bowel change Genitourinary: Negative for hematuria or change in urine volume.  Musculoskeletal: Negative for other MSK pain or swelling Skin: Negative for color change and worsening wound.  Neurological: Negative for tremors and numbness other than noted  Psychiatric/Behavioral: Negative for decreased concentration or agitation other than above       Objective:   Physical Exam BP 110/72 mmHg  Pulse 61  Temp(Src) 97.9 F (36.6 C) (Oral)  Ht  (1.499 m)  Wt 177 lb (80.287 kg)  BMI 35.73 kg/m2  SpO2 97% VS noted,  Constitutional: Pt appears in no significant distress HENT: Head: NCAT.  Right Ear: External ear normal.  Left Ear: External ear normal.  Eyes: . Pupils are equal, round, and reactive to light. Conjunctivae and EOM are normal Neck: Normal range of motion. Neck supple.  Cardiovascular: Normal rate and regular rhythm.   Pulmonary/Chest: Effort normal and breath sounds without rales or wheezing.  Neurological: Pt is alert. Not confused , motor grossly intact Skin: Skin is warm. No rash, no LE edema Psychiatric: Pt behavior is normal. No agitation.     Assessment & Plan:

## 2014-12-16 NOTE — Progress Notes (Signed)
Pre visit review using our clinic review tool, if applicable. No additional management support is needed unless otherwise documented below in the visit note. 

## 2014-12-17 ENCOUNTER — Telehealth: Payer: Self-pay | Admitting: *Deleted

## 2014-12-17 DIAGNOSIS — S72019A Unspecified intracapsular fracture of unspecified femur, initial encounter for closed fracture: Secondary | ICD-10-CM | POA: Diagnosis not present

## 2014-12-17 MED ORDER — TIZANIDINE HCL 4 MG PO TABS: 4.0000 mg | ORAL_TABLET | Freq: Four times a day (QID) | ORAL | Status: DC | PRN

## 2014-12-17 NOTE — Telephone Encounter (Signed)
Left msg on triage pt is needing refill on her Tizanidine. Sent electronically...Raechel Chute/lmb

## 2014-12-18 DIAGNOSIS — S72019A Unspecified intracapsular fracture of unspecified femur, initial encounter for closed fracture: Secondary | ICD-10-CM | POA: Diagnosis not present

## 2014-12-19 DIAGNOSIS — S72019A Unspecified intracapsular fracture of unspecified femur, initial encounter for closed fracture: Secondary | ICD-10-CM | POA: Diagnosis not present

## 2014-12-19 NOTE — Assessment & Plan Note (Signed)
stable overall by history and exam, recent data reviewed with pt, and pt to continue medical treatment as before,  to f/u any worsening symptoms or concerns Lab Results  Component Value Date   LDLCALC 69 12/16/2014

## 2014-12-19 NOTE — Assessment & Plan Note (Signed)
stable overall by history and exam, recent data reviewed with pt, and pt to continue medical treatment as before,  to f/u any worsening symptoms or concerns BP Readings from Last 3 Encounters:  12/16/14 110/72  10/26/14 149/84  09/16/14 124/76

## 2014-12-19 NOTE — Assessment & Plan Note (Signed)
stable overall by history and exam, recent data reviewed with pt, and pt to continue medical treatment as before,  to f/u any worsening symptoms or concerns Lab Results  Component Value Date   HGBA1C 6.0 12/16/2014

## 2014-12-20 DIAGNOSIS — S72019A Unspecified intracapsular fracture of unspecified femur, initial encounter for closed fracture: Secondary | ICD-10-CM | POA: Diagnosis not present

## 2014-12-21 DIAGNOSIS — S72019A Unspecified intracapsular fracture of unspecified femur, initial encounter for closed fracture: Secondary | ICD-10-CM | POA: Diagnosis not present

## 2014-12-22 DIAGNOSIS — S72019A Unspecified intracapsular fracture of unspecified femur, initial encounter for closed fracture: Secondary | ICD-10-CM | POA: Diagnosis not present

## 2014-12-23 DIAGNOSIS — S72019A Unspecified intracapsular fracture of unspecified femur, initial encounter for closed fracture: Secondary | ICD-10-CM | POA: Diagnosis not present

## 2014-12-24 DIAGNOSIS — S72019A Unspecified intracapsular fracture of unspecified femur, initial encounter for closed fracture: Secondary | ICD-10-CM | POA: Diagnosis not present

## 2014-12-25 DIAGNOSIS — S72019A Unspecified intracapsular fracture of unspecified femur, initial encounter for closed fracture: Secondary | ICD-10-CM | POA: Diagnosis not present

## 2014-12-26 DIAGNOSIS — S72019A Unspecified intracapsular fracture of unspecified femur, initial encounter for closed fracture: Secondary | ICD-10-CM | POA: Diagnosis not present

## 2014-12-27 DIAGNOSIS — S72019A Unspecified intracapsular fracture of unspecified femur, initial encounter for closed fracture: Secondary | ICD-10-CM | POA: Diagnosis not present

## 2014-12-28 DIAGNOSIS — S72019A Unspecified intracapsular fracture of unspecified femur, initial encounter for closed fracture: Secondary | ICD-10-CM | POA: Diagnosis not present

## 2014-12-29 DIAGNOSIS — S72019A Unspecified intracapsular fracture of unspecified femur, initial encounter for closed fracture: Secondary | ICD-10-CM | POA: Diagnosis not present

## 2014-12-30 DIAGNOSIS — S72019A Unspecified intracapsular fracture of unspecified femur, initial encounter for closed fracture: Secondary | ICD-10-CM | POA: Diagnosis not present

## 2014-12-31 DIAGNOSIS — S72019A Unspecified intracapsular fracture of unspecified femur, initial encounter for closed fracture: Secondary | ICD-10-CM | POA: Diagnosis not present

## 2015-01-01 DIAGNOSIS — S72019A Unspecified intracapsular fracture of unspecified femur, initial encounter for closed fracture: Secondary | ICD-10-CM | POA: Diagnosis not present

## 2015-01-02 DIAGNOSIS — S72019A Unspecified intracapsular fracture of unspecified femur, initial encounter for closed fracture: Secondary | ICD-10-CM | POA: Diagnosis not present

## 2015-01-03 DIAGNOSIS — S72019A Unspecified intracapsular fracture of unspecified femur, initial encounter for closed fracture: Secondary | ICD-10-CM | POA: Diagnosis not present

## 2015-01-04 DIAGNOSIS — S72019A Unspecified intracapsular fracture of unspecified femur, initial encounter for closed fracture: Secondary | ICD-10-CM | POA: Diagnosis not present

## 2015-01-05 DIAGNOSIS — S72019A Unspecified intracapsular fracture of unspecified femur, initial encounter for closed fracture: Secondary | ICD-10-CM | POA: Diagnosis not present

## 2015-01-06 DIAGNOSIS — S72019A Unspecified intracapsular fracture of unspecified femur, initial encounter for closed fracture: Secondary | ICD-10-CM | POA: Diagnosis not present

## 2015-01-07 DIAGNOSIS — S72019A Unspecified intracapsular fracture of unspecified femur, initial encounter for closed fracture: Secondary | ICD-10-CM | POA: Diagnosis not present

## 2015-01-08 DIAGNOSIS — S72019A Unspecified intracapsular fracture of unspecified femur, initial encounter for closed fracture: Secondary | ICD-10-CM | POA: Diagnosis not present

## 2015-01-09 DIAGNOSIS — S72019A Unspecified intracapsular fracture of unspecified femur, initial encounter for closed fracture: Secondary | ICD-10-CM | POA: Diagnosis not present

## 2015-01-10 ENCOUNTER — Encounter (HOSPITAL_COMMUNITY): Payer: Self-pay

## 2015-01-10 ENCOUNTER — Emergency Department (HOSPITAL_COMMUNITY): Payer: Medicare Other

## 2015-01-10 ENCOUNTER — Emergency Department (HOSPITAL_COMMUNITY)
Admission: EM | Admit: 2015-01-10 | Discharge: 2015-01-10 | Disposition: A | Discharge status: Home / Self Care | Payer: Medicare Other | Attending: Emergency Medicine | Admitting: Emergency Medicine

## 2015-01-10 DIAGNOSIS — E119 Type 2 diabetes mellitus without complications: Secondary | ICD-10-CM | POA: Insufficient documentation

## 2015-01-10 DIAGNOSIS — Z880 Allergy status to penicillin: Secondary | ICD-10-CM | POA: Insufficient documentation

## 2015-01-10 DIAGNOSIS — Z7951 Long term (current) use of inhaled steroids: Secondary | ICD-10-CM | POA: Diagnosis not present

## 2015-01-10 DIAGNOSIS — M810 Age-related osteoporosis without current pathological fracture: Secondary | ICD-10-CM | POA: Insufficient documentation

## 2015-01-10 DIAGNOSIS — F329 Major depressive disorder, single episode, unspecified: Secondary | ICD-10-CM | POA: Insufficient documentation

## 2015-01-10 DIAGNOSIS — Y9301 Activity, walking, marching and hiking: Secondary | ICD-10-CM | POA: Diagnosis not present

## 2015-01-10 DIAGNOSIS — Z8614 Personal history of Methicillin resistant Staphylococcus aureus infection: Secondary | ICD-10-CM | POA: Diagnosis not present

## 2015-01-10 DIAGNOSIS — S40011A Contusion of right shoulder, initial encounter: Secondary | ICD-10-CM | POA: Insufficient documentation

## 2015-01-10 DIAGNOSIS — F419 Anxiety disorder, unspecified: Secondary | ICD-10-CM | POA: Diagnosis not present

## 2015-01-10 DIAGNOSIS — Z8631 Personal history of diabetic foot ulcer: Secondary | ICD-10-CM | POA: Diagnosis not present

## 2015-01-10 DIAGNOSIS — Y998 Other external cause status: Secondary | ICD-10-CM | POA: Insufficient documentation

## 2015-01-10 DIAGNOSIS — W01198A Fall on same level from slipping, tripping and stumbling with subsequent striking against other object, initial encounter: Secondary | ICD-10-CM | POA: Diagnosis not present

## 2015-01-10 DIAGNOSIS — Z791 Long term (current) use of non-steroidal anti-inflammatories (NSAID): Secondary | ICD-10-CM | POA: Diagnosis not present

## 2015-01-10 DIAGNOSIS — H9190 Unspecified hearing loss, unspecified ear: Secondary | ICD-10-CM | POA: Diagnosis not present

## 2015-01-10 DIAGNOSIS — Z8709 Personal history of other diseases of the respiratory system: Secondary | ICD-10-CM | POA: Diagnosis not present

## 2015-01-10 DIAGNOSIS — H918X9 Other specified hearing loss, unspecified ear: Secondary | ICD-10-CM | POA: Diagnosis not present

## 2015-01-10 DIAGNOSIS — R011 Cardiac murmur, unspecified: Secondary | ICD-10-CM | POA: Insufficient documentation

## 2015-01-10 DIAGNOSIS — K219 Gastro-esophageal reflux disease without esophagitis: Secondary | ICD-10-CM | POA: Insufficient documentation

## 2015-01-10 DIAGNOSIS — Z872 Personal history of diseases of the skin and subcutaneous tissue: Secondary | ICD-10-CM | POA: Diagnosis not present

## 2015-01-10 DIAGNOSIS — Z7982 Long term (current) use of aspirin: Secondary | ICD-10-CM | POA: Diagnosis not present

## 2015-01-10 DIAGNOSIS — I10 Essential (primary) hypertension: Secondary | ICD-10-CM | POA: Insufficient documentation

## 2015-01-10 DIAGNOSIS — E785 Hyperlipidemia, unspecified: Secondary | ICD-10-CM | POA: Diagnosis not present

## 2015-01-10 DIAGNOSIS — M25511 Pain in right shoulder: Secondary | ICD-10-CM

## 2015-01-10 DIAGNOSIS — Z79899 Other long term (current) drug therapy: Secondary | ICD-10-CM | POA: Diagnosis not present

## 2015-01-10 DIAGNOSIS — Z87440 Personal history of urinary (tract) infections: Secondary | ICD-10-CM | POA: Diagnosis not present

## 2015-01-10 DIAGNOSIS — M1990 Unspecified osteoarthritis, unspecified site: Secondary | ICD-10-CM | POA: Insufficient documentation

## 2015-01-10 DIAGNOSIS — S72019A Unspecified intracapsular fracture of unspecified femur, initial encounter for closed fracture: Secondary | ICD-10-CM | POA: Diagnosis not present

## 2015-01-10 DIAGNOSIS — Y9289 Other specified places as the place of occurrence of the external cause: Secondary | ICD-10-CM | POA: Diagnosis not present

## 2015-01-10 DIAGNOSIS — M797 Fibromyalgia: Secondary | ICD-10-CM | POA: Diagnosis not present

## 2015-01-10 DIAGNOSIS — S4991XA Unspecified injury of right shoulder and upper arm, initial encounter: Secondary | ICD-10-CM | POA: Diagnosis present

## 2015-01-10 MED ORDER — IBUPROFEN 800 MG PO TABS: 800.0000 mg | ORAL_TABLET | Freq: Three times a day (TID) | ORAL | Status: DC

## 2015-01-10 MED ORDER — ACETAMINOPHEN 325 MG PO TABS
650.0000 mg | ORAL_TABLET | Freq: Once | ORAL | Status: DC
  Filled 2015-01-10: qty 2

## 2015-01-10 NOTE — Discharge Instructions (Signed)
[name],  Nice meeting you! Please follow-up with Dr. Jonny [name] within one week if you are still having pain. You may take 800 mg ibuprofen three times a day, with food. Return to the emergency department if your arm feels numb, if you are unable to move your arm, or if you continue to have pain. Feel better soon!  S. Katherine HackerNicole Sahalie Beth, PA-C   Joint Pain Joint pain, which is also called arthralgia, can be caused by many things. Joint pain often goes away when you follow your health care provider's instructions for relieving pain at home. However, joint pain can also be caused by conditions that require further treatment. Common causes of joint pain include:  Bruising in the area of the joint.  Overuse of the joint.  Wear and tear on the joints that occur with aging (osteoarthritis).  Various other forms of arthritis.  A buildup of a crystal form of uric acid in the joint (gout).  Infections of the joint (septic arthritis) or of the bone (osteomyelitis). Your health care provider may recommend medicine to help with the pain. If your joint pain continues, additional tests may be needed to diagnose your condition. HOME CARE INSTRUCTIONS Watch your condition for any changes. Follow these instructions as directed to lessen the pain that you are feeling.  Take medicines only as directed by your health care provider.  Rest the affected area for as long as your health care provider says that you should. If directed to do so, raise the painful joint above the level of your heart while you are sitting or lying down.  Do not do things that cause or worsen pain.  If directed, apply ice to the painful area:  Put ice in a plastic bag.  Place a towel between your skin and the bag.  Leave the ice on for 20 minutes, 2-3 times per day.  Wear an elastic bandage, splint, or sling as directed by your health care provider. Loosen the elastic bandage or splint if your fingers or toes become numb  and tingle, or if they turn cold and blue.  Begin exercising or stretching the affected area as directed by your health care provider. Ask your health care provider what types of exercise are safe for you.  Keep all follow-up visits as directed by your health care provider. This is important. SEEK MEDICAL CARE IF:  Your pain increases, and medicine does not help.  Your joint pain does not improve within 3 days.  You have increased bruising or swelling.  You have a fever.  You lose 10 lb (4.5 kg) or more without trying. SEEK IMMEDIATE MEDICAL CARE IF:  You are not able to move the joint.  Your fingers or toes become numb or they turn cold and blue.   This information is not intended to replace advice given to you by your health care provider. Make sure you discuss any questions you have with your health care provider.   Document Released: 01/16/2005 Document Revised: 02/06/2014 Document Reviewed: 10/28/2013 Elsevier Interactive Patient Education [employer]2016 [employer]

## 2015-01-10 NOTE — ED Notes (Signed)
Onset last night pt was walking in laundry room, lights off, and tripped over a bin and fell on right upper arm.  No head injury, LOC.

## 2015-01-11 DIAGNOSIS — S72019A Unspecified intracapsular fracture of unspecified femur, initial encounter for closed fracture: Secondary | ICD-10-CM | POA: Diagnosis not present

## 2015-01-11 NOTE — ED Provider Notes (Signed)
CSN: [id number]     Arrival date & time 01/10/15  1324 History   First MD Initiated Contact with Patient 01/10/15 1352     Chief Complaint  Patient presents with  . Arm Injury   HPI Katherine [name] is a 69 y.o. PMH significant for fibromyalgia, HTN, HLD, DM presenting with right shoulder pain s/p fall last night. She was walking in her laundry room with the lights off, tripped over a bin and landed on her right shoulder. She describes her pain as 8/10 pain scale, non-radiating, constant, achy, worsened with movement. She denies fevers, chills, AMS, LOC, CP, SOB, N/V, changes in bowel/bladder habits, alleviation attempts.   Past Medical History  Diagnosis Date  . ABDOMINAL PAIN, LOWER 02/04/2010  . ABNORMAL THYROID FUNCTION TESTS 08/04/2009  . ALLERGIC RHINITIS 09/05/2006  . ANXIETY 07/31/2007  . DEPRESSION 09/05/2006  . DIVERTICULOSIS, COLON 09/05/2006  . Dysuria 04/15/2010  . Failure to thrive in childhood 04/06/2010  . FATIGUE 04/15/2010  . FIBROMYALGIA 05/10/2009  . FRACTURE, PELVIS, RIGHT 04/15/2010  . GASTROENTERITIS, ACUTE 12/25/2008  . GERD 09/05/2006  . HYPERLIPIDEMIA 09/05/2006  . HYPERTENSION 09/05/2006  . KNEE PAIN, LEFT 02/04/2010  . OSTEOARTHRITIS 09/05/2006  . OSTEOPOROSIS 07/31/2007  . Other specified forms of hearing loss 11/01/2009  . SINUSITIS- ACUTE-NOS 10/09/2007  . SKIN LESION 05/10/2009  . SPRAIN&STRAIN OTH SPEC SITES SHOULDER&UPPER ARM 11/01/2009  . Unspecified hearing loss 10/09/2007  . URI 02/20/2007  . UTI 02/20/2007  . Blood transfusion   . Cataract     resolved -surgery done  . Heart murmur   . DIABETES MELLITUS, TYPE II 07/31/2007    no meds  . MRSA infection 10/26/2014  . Diabetic foot ulcer (HCC) 10/26/2014   Past Surgical History  Procedure Laterality Date  . Tonsillectomy    . Breast biopsy    . Abdominal hysterectomy  12/08    Bladder tac, partial hysterectomy  . Inguinal herniorrhapy      right Dr. Freida BusmanAllen 06/2009  . Catarct removal      both eyes  . Total knee  arthroplasty Left 12/20/2012    Procedure: LEFT TOTAL KNEE ARTHROPLASTY;  Surgeon: Kathryne Hitchhristopher Y Blackman, MD;  Location: [facility];  Service: Orthopedics;  Laterality: Left;  . I&d knee with poly exchange Left 01/03/2013    Procedure: IRRIGATION AND DEBRIDEMENT LEFT KNEE WITH POLY EXCHANGE;  Surgeon: Kathryne Hitchhristopher Y Blackman, MD;  Location: [facility];  Service: Orthopedics;  Laterality: Left;   Family History  Problem Relation Age of Onset  . Coronary artery disease Other   . Diabetes Other   . Colon cancer Neg Hx   . Esophageal cancer Neg Hx   . Stomach cancer Neg Hx   . Rectal cancer Neg Hx    Social History  Substance Use Topics  . Smoking status: Never Smoker   . Smokeless tobacco: None  . Alcohol Use: No   OB History    No data available     Review of Systems  Ten systems are reviewed and are negative for acute change except as noted in the HPI   Allergies  Penicillins and Thioridazine hcl  Home Medications   Prior to Admission medications   Medication Sig Start Date End Date Taking? Authorizing Provider  albuterol (PROVENTIL HFA;VENTOLIN HFA) 108 (90 BASE) MCG/ACT inhaler Inhale 2 puffs into the lungs every 6 (six) hours as needed for wheezing or shortness of breath. 06/26/14  Yes Corwin LevinsJames W John, MD  alendronate (FOSAMAX) 70 MG tablet  Take 1 tablet (70 mg total) by mouth every 7 (seven) days. Take with a full glass of water on an empty stomach. 06/16/14  Yes Corwin Levins, MD  ALPRAZolam Prudy Feeler) 0.5 MG tablet Take 0.5 mg by mouth 2 (two) times daily as needed for anxiety.   Yes Historical Provider, MD  amLODipine (NORVASC) 10 MG tablet Take 0.5 tablets (5 mg total) by mouth every morning. 11/23/14  Yes Corwin Levins, MD  aspirin EC 325 MG EC tablet Take 1 tablet (325 mg total) by mouth 2 (two) times daily after a meal. 12/23/12  Yes Kirtland Bouchard, PA-C  atorvastatin (LIPITOR) 40 MG tablet Take 1 tablet (40 mg total) by mouth every morning. 12/11/13  Yes Corwin Levins, MD   azelastine (ASTELIN) 0.1 % nasal spray Place 1 spray into both nostrils 2 (two) times daily. Use in each nostril as directed 11/23/14  Yes Corwin Levins, MD  benztropine (COGENTIN) 0.5 MG tablet Take 0.5 mg by mouth 2 (two) times daily.     Yes Historical Provider, MD  budesonide (RHINOCORT AQUA) 32 MCG/ACT nasal spray Place 2 sprays into both nostrils daily. 07/14/14  Yes Corwin Levins, MD  busPIRone (BUSPAR) 10 MG tablet Take 1 tablet (10 mg total) by mouth 3 (three) times daily. 12/11/13  Yes Corwin Levins, MD  calcium-vitamin D (OSCAL WITH D) 500-200 MG-UNIT per tablet Take 1 tablet by mouth 3 (three) times daily. 12/23/13  Yes Corwin Levins, MD  cetirizine (ZYRTEC) 10 MG tablet TAKE 1 TABLET BY MOUTH DAILY 06/23/14  Yes Corwin Levins, MD  diclofenac sodium (VOLTAREN) 1 % GEL Apply topically 4 (four) times daily.   Yes Historical Provider, MD  Diclofenac Sodium 1.5 % SOLN  04/09/14  Yes Historical Provider, MD  etodolac (LODINE) 400 MG tablet Take 400 mg by mouth 2 (two) times daily.   Yes Historical Provider, MD  fluticasone (FLONASE) 50 MCG/ACT nasal spray Place 2 sprays into both nostrils daily. 07/24/14  Yes Corwin Levins, MD  glucose blood [facility] VERIO) test strip 1 each by Other route 2 (two) times daily. Use to check blood sugars twice a day Dx E11.9   Yes Historical Provider, MD  glucose blood (ONETOUCH VERIO) test strip 1 each by Other route 2 (two) times daily. Use to check blood sugar twice a day Dx E11.9 10/22/14  Yes Corwin Levins, MD  hydrOXYzine (ATARAX/VISTARIL) 10 MG tablet Take 1 tablet (10 mg total) by mouth 2 (two) times daily as needed. 12/11/13  Yes Corwin Levins, MD  ipratropium (ATROVENT) 0.06 % nasal spray Place 1 spray into both nostrils 3 (three) times daily. 07/20/14  Yes Corwin Levins, MD  omeprazole (PRILOSEC) 20 MG capsule  09/24/14  Yes Historical Provider, MD  ondansetron (ZOFRAN) 4 MG tablet Take 1 tablet (4 mg total) by mouth every 8 (eight) hours as needed for nausea or  vomiting. 10/29/14  Yes Corwin Levins, MD  ONE [facility] LANCETS MISC Use as directed once daily to check blood sugar.  Diagnosis code 250.00 12/11/13  Yes Corwin Levins, MD  ONE TOUCH LANCETS MISC Use to check blood sugars twice a day Dx E11.9 10/22/14  Yes Corwin Levins, MD  pantoprazole (PROTONIX) 40 MG tablet Take 1 tablet (40 mg total) by mouth daily. 11/17/14  Yes Corwin Levins, MD  potassium chloride SA (K-DUR,KLOR-CON) 20 MEQ tablet Take 1 tablet (20 mEq total) by mouth 2 (two) times daily.  09/16/14  Yes Corwin Levins, MD  pregabalin (LYRICA) 75 MG capsule Take 1 capsule (75 mg total) by mouth 2 (two) times daily. 01/09/13  Yes Sharon Seller, NP  promethazine (PHENERGAN) 12.5 MG tablet TAKE 1 TABLET BY MOUTH EVERY 8 HOURS AS NEEDED FOR NAUSEA AND VOMITING 10/01/13  Yes Corwin Levins, MD  risperiDONE (RISPERDAL) 3 MG tablet Take 3 mg by mouth at bedtime.    Yes Historical Provider, MD  tiZANidine (ZANAFLEX) 4 MG tablet Take 1 tablet (4 mg total) by mouth every 6 (six) hours as needed for muscle spasms. 12/17/14  Yes Corwin Levins, MD  triamcinolone (NASACORT AQ) 55 MCG/ACT AERO nasal inhaler Place 2 sprays into the nose daily. 07/23/14  Yes Corwin Levins, MD  ibuprofen (ADVIL,MOTRIN) 800 MG tablet Take 1 tablet (800 mg total) by mouth 3 (three) times daily. 01/10/15   Melton Krebs, PA-C   BP 123/81 mmHg  Pulse 72  Temp(Src) 97.9 F (36.6 C) (Oral)  Resp 15  Ht  (1.549 m)  Wt 80.287 kg  BMI 33.46 kg/m2  SpO2 93% Physical Exam  Constitutional: She appears well-developed and well-nourished. No distress.  HENT:  Head: Normocephalic and atraumatic.  Mouth/Throat: Oropharynx is clear and moist. No oropharyngeal exudate.  Eyes: Conjunctivae are normal. Pupils are equal, round, and reactive to light. Right eye exhibits no discharge. Left eye exhibits no discharge. No scleral icterus.  Neck: No tracheal deviation present.  Cardiovascular: Normal rate, regular rhythm, normal heart sounds  and intact distal pulses.  Exam reveals no gallop and no friction rub.   No murmur heard. Pulmonary/Chest: Effort normal and breath sounds normal. No respiratory distress. She has no wheezes. She has no rales. She exhibits no tenderness.  Abdominal: Soft. Bowel sounds are normal. She exhibits no distension and no mass. There is no tenderness. There is no rebound and no guarding.  Musculoskeletal: Normal range of motion. She exhibits tenderness. She exhibits no edema.  Ecchymosis overlying right anterior shoulder. Patient with diffuse tenderness along right shoulder. Slightly decreased shoulder ROM. Neurovascularly intact bilaterally.   Lymphadenopathy:    She has no cervical adenopathy.  Neurological: She is alert. Coordination normal.  Skin: Skin is warm and dry. No rash noted. She is not diaphoretic. No erythema.  Psychiatric: She has a normal mood and affect. Her behavior is normal.  Nursing note and vitals reviewed.   ED Course  Procedures  Imaging Review Dg Shoulder Right  01/10/2015  CLINICAL DATA:  Fall, acute right shoulder pain and bruising. EXAM: RIGHT SHOULDER - 2+ VIEW COMPARISON:  Unavailable FINDINGS: Bones are osteopenic. Mild-to-moderate degenerative changes of the [facility] joint and glenohumeral joint. No acute displaced fracture or malalignment. No subluxation or dislocation. Visualized right ribs intact. Right lung clear. IMPRESSION: Osteopenia and degenerative changes. No acute finding by plain radiography Electronically Signed   By: Judie Petit.  Shick M.D.   On: 01/10/2015 15:07   I have personally reviewed and evaluated these images and lab results as part of my medical decision-making.  MDM   Final diagnoses:  Right shoulder pain   Patient non-toxic appearing and VSS. Because of diffuse tenderness, will xray.   Upon reevaluation, patient informed me she refused Tylenol because she was told not to take it in the past. I informed her the xray unremarkable and she declined  alternative pain medication because she is feeling better. Patient may be safely discharged home. Discussed reasons for return. Patient to follow-up with primary care  provider within one week. Patient in understanding and agreement with the plan.   Melton Krebs, PA-C 01/11/15 [id number]  Margarita Grizzle, MD 01/20/15 1520

## 2015-01-12 DIAGNOSIS — S72019A Unspecified intracapsular fracture of unspecified femur, initial encounter for closed fracture: Secondary | ICD-10-CM | POA: Diagnosis not present

## 2015-01-13 DIAGNOSIS — S72019A Unspecified intracapsular fracture of unspecified femur, initial encounter for closed fracture: Secondary | ICD-10-CM | POA: Diagnosis not present

## 2015-01-14 DIAGNOSIS — S72019A Unspecified intracapsular fracture of unspecified femur, initial encounter for closed fracture: Secondary | ICD-10-CM | POA: Diagnosis not present

## 2015-01-15 DIAGNOSIS — S72019A Unspecified intracapsular fracture of unspecified femur, initial encounter for closed fracture: Secondary | ICD-10-CM | POA: Diagnosis not present

## 2015-01-16 DIAGNOSIS — S72019A Unspecified intracapsular fracture of unspecified femur, initial encounter for closed fracture: Secondary | ICD-10-CM | POA: Diagnosis not present

## 2015-01-17 DIAGNOSIS — S72019A Unspecified intracapsular fracture of unspecified femur, initial encounter for closed fracture: Secondary | ICD-10-CM | POA: Diagnosis not present

## 2015-01-18 DIAGNOSIS — S72019A Unspecified intracapsular fracture of unspecified femur, initial encounter for closed fracture: Secondary | ICD-10-CM | POA: Diagnosis not present

## 2015-01-19 DIAGNOSIS — S72019A Unspecified intracapsular fracture of unspecified femur, initial encounter for closed fracture: Secondary | ICD-10-CM | POA: Diagnosis not present

## 2015-01-20 ENCOUNTER — Other Ambulatory Visit: Payer: Self-pay

## 2015-01-20 DIAGNOSIS — S72019A Unspecified intracapsular fracture of unspecified femur, initial encounter for closed fracture: Secondary | ICD-10-CM | POA: Diagnosis not present

## 2015-01-20 MED ORDER — FLUTICASONE PROPIONATE 50 MCG/ACT NA SUSP: 2.0000 | Freq: Every day | NASAL | Status: DC

## 2015-01-21 DIAGNOSIS — S72019A Unspecified intracapsular fracture of unspecified femur, initial encounter for closed fracture: Secondary | ICD-10-CM | POA: Diagnosis not present

## 2015-01-22 DIAGNOSIS — S72019A Unspecified intracapsular fracture of unspecified femur, initial encounter for closed fracture: Secondary | ICD-10-CM | POA: Diagnosis not present

## 2015-01-23 DIAGNOSIS — S72019A Unspecified intracapsular fracture of unspecified femur, initial encounter for closed fracture: Secondary | ICD-10-CM | POA: Diagnosis not present

## 2015-01-24 DIAGNOSIS — S72019A Unspecified intracapsular fracture of unspecified femur, initial encounter for closed fracture: Secondary | ICD-10-CM | POA: Diagnosis not present

## 2015-01-25 DIAGNOSIS — S72019A Unspecified intracapsular fracture of unspecified femur, initial encounter for closed fracture: Secondary | ICD-10-CM | POA: Diagnosis not present

## 2015-01-26 DIAGNOSIS — S72019A Unspecified intracapsular fracture of unspecified femur, initial encounter for closed fracture: Secondary | ICD-10-CM | POA: Diagnosis not present

## 2015-01-27 DIAGNOSIS — S72019A Unspecified intracapsular fracture of unspecified femur, initial encounter for closed fracture: Secondary | ICD-10-CM | POA: Diagnosis not present

## 2015-01-28 DIAGNOSIS — S72019A Unspecified intracapsular fracture of unspecified femur, initial encounter for closed fracture: Secondary | ICD-10-CM | POA: Diagnosis not present

## 2015-01-29 DIAGNOSIS — S72019A Unspecified intracapsular fracture of unspecified femur, initial encounter for closed fracture: Secondary | ICD-10-CM | POA: Diagnosis not present

## 2015-01-30 DIAGNOSIS — S72019A Unspecified intracapsular fracture of unspecified femur, initial encounter for closed fracture: Secondary | ICD-10-CM | POA: Diagnosis not present

## 2015-01-31 DIAGNOSIS — S72019A Unspecified intracapsular fracture of unspecified femur, initial encounter for closed fracture: Secondary | ICD-10-CM | POA: Diagnosis not present

## 2015-02-01 DIAGNOSIS — S72019A Unspecified intracapsular fracture of unspecified femur, initial encounter for closed fracture: Secondary | ICD-10-CM | POA: Diagnosis not present

## 2015-02-02 ENCOUNTER — Telehealth: Payer: Self-pay

## 2015-02-02 DIAGNOSIS — S72019A Unspecified intracapsular fracture of unspecified femur, initial encounter for closed fracture: Secondary | ICD-10-CM | POA: Diagnosis not present

## 2015-02-02 NOTE — Telephone Encounter (Signed)
This patient called back and will try to come in for AWV 1/9 at 1pm. Will confirm with the person bringing her. Would like to go over her medicine as she gets things mixed up.

## 2015-02-02 NOTE — Telephone Encounter (Signed)
Patient called to educate on Medicare Wellness apt. LVM for the patient to call back to educate and schedule for wellness visit. Left office number or direct line

## 2015-02-03 DIAGNOSIS — S72019A Unspecified intracapsular fracture of unspecified femur, initial encounter for closed fracture: Secondary | ICD-10-CM | POA: Diagnosis not present

## 2015-02-04 DIAGNOSIS — S72019A Unspecified intracapsular fracture of unspecified femur, initial encounter for closed fracture: Secondary | ICD-10-CM | POA: Diagnosis not present

## 2015-02-05 ENCOUNTER — Telehealth: Payer: Self-pay | Admitting: *Deleted

## 2015-02-05 DIAGNOSIS — S72019A Unspecified intracapsular fracture of unspecified femur, initial encounter for closed fracture: Secondary | ICD-10-CM | POA: Diagnosis not present

## 2015-02-05 MED ORDER — ONDANSETRON HCL 4 MG PO TABS: 4.0000 mg | ORAL_TABLET | Freq: Three times a day (TID) | ORAL | Status: DC | PRN

## 2015-02-05 NOTE — Telephone Encounter (Signed)
[facility] on Zofran refill. Verified chart inform no request has been sent electronically, but it was ok to refill. Gave verbal authorization, also updated chart...Raechel Chute/lmb

## 2015-02-06 DIAGNOSIS — S72019A Unspecified intracapsular fracture of unspecified femur, initial encounter for closed fracture: Secondary | ICD-10-CM | POA: Diagnosis not present

## 2015-02-07 DIAGNOSIS — S72019A Unspecified intracapsular fracture of unspecified femur, initial encounter for closed fracture: Secondary | ICD-10-CM | POA: Diagnosis not present

## 2015-02-08 ENCOUNTER — Ambulatory Visit (INDEPENDENT_AMBULATORY_CARE_PROVIDER_SITE_OTHER): Payer: Medicare Other

## 2015-02-08 VITALS — BP 130/80 | Ht <= 58 in | Wt 173.8 lb

## 2015-02-08 DIAGNOSIS — Z0000 Encounter for general adult medical examination without abnormal findings: Secondary | ICD-10-CM | POA: Diagnosis not present

## 2015-02-08 DIAGNOSIS — S72019A Unspecified intracapsular fracture of unspecified femur, initial encounter for closed fracture: Secondary | ICD-10-CM | POA: Diagnosis not present

## 2015-02-08 DIAGNOSIS — Z135 Encounter for screening for eye and ear disorders: Secondary | ICD-10-CM

## 2015-02-08 NOTE — Progress Notes (Addendum)
Subjective:   Katherine [name] is a 70 y.o. female who presents for Medicare Annual (Subsequent) preventive examination.  Review of Systems:  HRA assessment completed during visit; Katherine [name] The Patient was informed that this wellness visit is to identify risk and educate on how to reduce risk for increase disease through lifestyle changes.   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 Jonny RuizJohn makes her medical decisions; so held the PSV 23 for Dr. Jonny RuizJohn to order in Feb if necessary;   Medical issues HTN; Lipids 11/16; chol 145; Trig 144; HDL 46; LDL 69; ratio 3 Sees [facility] on the 30th; Dr. Jonny RuizJohn again on the 16 of Feb. DM; 11/6 (6.0)  Osteoporosis; takes fosamax   BMI: 26.2 Diet; Aid fixes food; warms up in microwave; doesn't like MOW Smoothies; sandwich; bologna; grits;  chicken;  Supper; can open canned food;   Exercise; exercises some during the day; does chair exercises   SAFETY/ lives at [facility]; [address] 8; DietitianLiberty Nurse ([facility]) comes out to see her;  One level ground apt; steps to get in;  Aid helps her from nursing care agency; She helps fix breakfast; cleaning; nurse prepares meds; comes 5 days a week; helps her with a bath;  Friends; Lenise ArenaFaye Harley help with bills;   Hx Fall x 1; tripped over a rug; went to church and did go to ER; bruises' no other injuries and moved the rug; will turn the light from now on.  Police watches her; Safety reviewed for the home; railing as needed; bathroom safety; community safety;  Has friends; family; skin warm and dry; no notable bruises or other issues noted; states aid does some extra for her at times;  States security is staying with her at hs; talks about Katherine [name]; smoke detectors; yes  Driving accidents does not drive Theatre stage managerun protection; not in  sun  Mental affect was engaging; able to states address; date; time etc;  Not sure of POA or if she her own POA; States she was but then states Dr. Jonny RuizJohn and police may be POA/   Medication review/nurse pours her medicine; the patient can state and sometimes spell which medicine she takes;   Gait assessment: walks with cane but sometimes carries it; Uses [employer] transportation  Mobilization and Functional losses in the last year. Currently treated for ulcer on foot but states it is better;    Counseling: Hep C Dr. Jonny RuizJohn will do when he draws blood again; (already ordered) Colonoscopy; 05/2011 x 10 years 05/2021 EKG: 03/2013 Hearing: 4000hz   Dexa 06/2012 Osteopenia with lowest T score -2.3/ hx; states she is taking  Fosamax;   Mammagram: 10/2012; No mammographic evidence of malignancy Ophthalmology exam; needs eye exam; need referral;  Immunizations Due PSV 23 at 70 yo; Prevnar 11/2012/  Defer PSV 23 to Dr.  Jonny RuizJohn  Current Care Team reviewed and updated Was able to name doctors she goes to. Did not have vision doctor so did agreed to ophthalmology referral    Cardiac Risk Factors include: advanced age (>8755men, 8>65 women);diabetes mellitus;dyslipidemia     Objective:     Vitals: BP 130/80 mmHg  Ht 4\' 10"  (1.473 m)  Wt 173 lb 12 oz (78.812 kg)  BMI 36.32 kg/m2  Tobacco History  Smoking status  .  Never Smoker   Smokeless tobacco  . Not on file     Counseling given: Yes   Past Medical History  Diagnosis Date  . ABDOMINAL PAIN, LOWER 02/04/2010  . ABNORMAL THYROID FUNCTION TESTS 08/04/2009  . ALLERGIC RHINITIS 09/05/2006  . ANXIETY 07/31/2007  . DEPRESSION 09/05/2006  . DIVERTICULOSIS, COLON 09/05/2006  . Dysuria 04/15/2010  . Failure to thrive in childhood 04/06/2010  . FATIGUE 04/15/2010  . FIBROMYALGIA 05/10/2009  . FRACTURE, PELVIS, RIGHT 04/15/2010  . GASTROENTERITIS, ACUTE 12/25/2008  . GERD 09/05/2006  . HYPERLIPIDEMIA 09/05/2006  . HYPERTENSION 09/05/2006  . KNEE PAIN, LEFT  02/04/2010  . OSTEOARTHRITIS 09/05/2006  . OSTEOPOROSIS 07/31/2007  . Other specified forms of hearing loss 11/01/2009  . SINUSITIS- ACUTE-NOS 10/09/2007  . SKIN LESION 05/10/2009  . SPRAIN&STRAIN OTH SPEC SITES SHOULDER&UPPER ARM 11/01/2009  . Unspecified hearing loss 10/09/2007  . URI 02/20/2007  . UTI 02/20/2007  . Blood transfusion   . Cataract     resolved -surgery done  . Heart murmur   . DIABETES MELLITUS, TYPE II 07/31/2007    no meds  . MRSA infection 10/26/2014  . Diabetic foot ulcer (HCC) 10/26/2014   Past Surgical History  Procedure Laterality Date  . Tonsillectomy    . Breast biopsy    . Abdominal hysterectomy  12/08    Bladder tac, partial hysterectomy  . Inguinal herniorrhapy      right Dr. Freida Busman 06/2009  . Catarct removal      both eyes  . Total knee arthroplasty Left 12/20/2012    Procedure: LEFT TOTAL KNEE ARTHROPLASTY;  Surgeon: Kathryne Hitch, MD;  Location: [facility];  Service: Orthopedics;  Laterality: Left;  . I&d knee with poly exchange Left 01/03/2013    Procedure: IRRIGATION AND DEBRIDEMENT LEFT KNEE WITH POLY EXCHANGE;  Surgeon: Kathryne Hitch, MD;  Location: [facility];  Service: Orthopedics;  Laterality: Left;   Family History  Problem Relation Age of Onset  . Coronary artery disease Other   . Diabetes Other   . Colon cancer Neg Hx   . Esophageal cancer Neg Hx   . Stomach cancer Neg Hx   . Rectal cancer Neg Hx    History  Sexual Activity  . Sexual Activity: Not on file    Outpatient Encounter Prescriptions as of 02/08/2015  Medication Sig  . albuterol (PROVENTIL HFA;VENTOLIN HFA) 108 (90 BASE) MCG/ACT inhaler Inhale 2 puffs into the lungs every 6 (six) hours as needed for wheezing or shortness of breath.  [name] alendronate (FOSAMAX) 70 MG tablet Take 1 tablet (70 mg total) by mouth every 7 (seven) days. Take with a full glass of water on an empty stomach.  . ALPRAZolam (XANAX) 0.5 MG tablet Take 0.5 mg by mouth 2 (two) times daily as needed for anxiety.    [name] amLODipine (NORVASC) 10 MG tablet Take 0.5 tablets (5 mg total) by mouth every morning.  [name] aspirin EC 325 MG EC tablet Take 1 tablet (325 mg total) by mouth 2 (two) times daily after a meal.  . atorvastatin (LIPITOR) 40 MG tablet Take 1 tablet (40 mg total) by mouth every morning.  [name] azelastine (ASTELIN) 0.1 % nasal spray Place 1 spray into both nostrils 2 (two) times daily. Use in each nostril as directed  . benztropine (COGENTIN) 0.5 MG tablet Take 0.5 mg by mouth 2 (two) times daily.    . busPIRone (BUSPAR) 10 MG tablet Take 1 tablet (10 mg total) by mouth 3 (three) times daily.  [name]  calcium-vitamin D (OSCAL WITH D) 500-200 MG-UNIT per tablet Take 1 tablet by mouth 3 (three) times daily.  . cetirizine (ZYRTEC) 10 MG tablet TAKE 1 TABLET BY MOUTH DAILY  . diclofenac sodium (VOLTAREN) 1 % GEL Apply topically 4 (four) times daily.  . Diclofenac Sodium 1.5 % SOLN   . etodolac (LODINE) 400 MG tablet Take 400 mg by mouth 2 (two) times daily.  . fluticasone (FLONASE) 50 MCG/ACT nasal spray Place 2 sprays into both nostrils daily.  [name] glucose blood (ONETOUCH VERIO) test strip 1 each by Other route 2 (two) times daily. Use to check blood sugars twice a day Dx E11.9  . glucose blood (ONETOUCH VERIO) test strip 1 each by Other route 2 (two) times daily. Use to check blood sugar twice a day Dx E11.9  . hydrOXYzine (ATARAX/VISTARIL) 10 MG tablet Take 1 tablet (10 mg total) by mouth 2 (two) times daily as needed.  [name] ibuprofen (ADVIL,MOTRIN) 800 MG tablet Take 1 tablet (800 mg total) by mouth 3 (three) times daily.  [name] ipratropium (ATROVENT) 0.06 % nasal spray Place 1 spray into both nostrils 3 (three) times daily.  [name] omeprazole (PRILOSEC) 20 MG capsule   . ondansetron (ZOFRAN) 4 MG tablet Take 1 tablet (4 mg total) by mouth every 8 (eight) hours as needed for nausea or vomiting.  . ONE TOUCH LANCETS MISC Use as directed once daily to check blood sugar.  Diagnosis code 250.00  . ONE TOUCH LANCETS MISC Use  to check blood sugars twice a day Dx E11.9  . pantoprazole (PROTONIX) 40 MG tablet Take 1 tablet (40 mg total) by mouth daily.  . potassium chloride SA (K-DUR,KLOR-CON) 20 MEQ tablet Take 1 tablet (20 mEq total) by mouth 2 (two) times daily.  . pregabalin (LYRICA) 75 MG capsule Take 1 capsule (75 mg total) by mouth 2 (two) times daily.  . promethazine (PHENERGAN) 12.5 MG tablet TAKE 1 TABLET BY MOUTH EVERY 8 HOURS AS NEEDED FOR NAUSEA AND VOMITING  . risperiDONE (RISPERDAL) 3 MG tablet Take 3 mg by mouth at bedtime.   [name] tiZANidine (ZANAFLEX) 4 MG tablet Take 1 tablet (4 mg total) by mouth every 6 (six) hours as needed for muscle spasms.  . budesonide (RHINOCORT AQUA) 32 MCG/ACT nasal spray Place 2 sprays into both nostrils daily. (Patient not taking: Reported on 02/08/2015)  . triamcinolone (NASACORT AQ) 55 MCG/ACT AERO nasal inhaler Place 2 sprays into the nose daily. (Patient not taking: Reported on 02/08/2015)   No facility-administered encounter medications on file as of 02/08/2015.    Activities of Daily Living In your present state of health, do you have any difficulty performing the following activities: 02/08/2015  Hearing? N  Vision? (No Data)  Difficulty concentrating or making decisions? (No Data)  Walking or climbing stairs? Y  Dressing or bathing? Y  Doing errands, shopping? Y  Preparing Food and eating ? Y  Using the Toilet? N  In the past six months, have you accidently leaked urine? N  Do you have problems with loss of bowel control? N  Managing your Medications? Y  Managing your Finances? N  Housekeeping or managing your Housekeeping? N    Patient Care Team: Corwin Levins, MD as PCP - General Oneal Grout, MD as Consulting Physician (Internal Medicine) Kathryne Hitch, MD as Consulting Physician (Orthopedic Surgery)    Assessment:    Assessment   Patient presents for yearly preventative medicine examination. Medicare questionnaire screening were completed,  i.e. Functional; fall  risk; depression, memory loss and hearing. (memory deferred to Psych) Affect smiling and engaging; answered questions appropriately; Discussed past issues; Difficult to evaluated due to no mental health hx; the patient did state [facility] provide nursing and HHA; [phone]. States aid does do a lot for her "when she wants to" but does declines any wish to change aids; Denies being treated poorly, pushed or other issues; Weight good; has cookies and snacks with her today; Understands meds and states clearly what she takes and does not take.   All immunizations and health maintenance protocols were reviewed with the patient and stated she would discuss PSV 23 to Dr. Jonny Ruiz. Rec'd when she was approx 70yo.   Education provided for laboratory screens;  A1c 6; Lipids ratio 3;   Medication reconciliation, past medical history, social history, problem list and allergies were reviewed in detail with the patient  Goals were established with regard to the patient wanting to go to church and remain helpful to others  Very pleasant today; no new issues identified; Did recommend opthalmology referral   End of life planning was discussed. Donovan Kail POA so will defer PSV 23 )    Exercise Activities and Dietary recommendations Current Exercise Habits:: Home exercise routine, Type of exercise: strength training/weights, Time (Minutes): 20, Frequency (Times/Week): 3, Weekly Exercise (Minutes/Week): 60, Intensity: Mild (nurse has taught her chair exercises; )  Goals    . patient     Wants to be a friend to others and do what I am called to do, which is help people      Fall Risk Fall Risk  02/08/2015 03/30/2014 08/08/2013 11/07/2012  Falls in the past year? Yes No Yes No  Number falls in past yr: 1 - 1 -  Injury with Fall? No - Yes -  Follow up Education provided - - -   Depression Screen PHQ 2/9 Scores 03/30/2014 08/08/2013 11/07/2012  PHQ - 2 Score 0 1 0      Cognitive Testing No flowsheet data found.  Immunization History  Administered Date(s) Administered  . Influenza Split 10/12/2010, 11/07/2011  . Influenza Whole 11/30/2005, 10/24/2007, 10/27/2008  . Influenza, High Dose Seasonal PF 11/07/2012, 12/11/2013  . Influenza-Unspecified 11/28/2014  . Pneumococcal Conjugate-13 12/04/2012  . Pneumococcal Polysaccharide-23 01/27/2008  . Td 01/27/2008  . Zoster 05/09/2012   Screening Tests Health Maintenance  Topic Date Due  . Hepatitis C Screening  1945/03/02  . OPHTHALMOLOGY EXAM  09/01/1955  . FOOT EXAM  11/07/2013  . PNA vac Low Risk Adult (2 of 2 - PPSV23) 12/04/2013  . HEMOGLOBIN A1C  06/15/2015  . URINE MICROALBUMIN  06/16/2015  . INFLUENZA VACCINE  08/31/2015  . MAMMOGRAM  02/04/2016  . TETANUS/TDAP  01/26/2018  . COLONOSCOPY  06/12/2021  . DEXA SCAN  Completed  . ZOSTAVAX  Completed      Plan:   During the course of the visit the patient was educated and counseled about the following appropriate screening and preventive services:   Vaccines to include Pneumoccal, Influenza, Hepatitis B, Td, Zostavax, HCV/ Hep c was already ordered; deferred to next blood draw  Electrocardiogram: 03/2013  Cardiovascular Disease/ BP good   Colorectal cancer screening/ due 05/2021  Bone density screening/ completed and taking fosamax  Diabetes screening/ completed and A1c 6.0  Glaucoma screening/ eye exam referral made today  Mammography 2014   Nutrition counseling / aid cooks for her and prepares her meals; BMI 26.2  Patient Instructions (the written plan) was  given to the patient.   Mariane Burpee, RN  02/08/2015    Medical screening examination/treatment/procedure(s) were performed by non-physician practitioner and as supervising provider I was immediately available for consultation/collaboration. I agree with above. Jeanine Luz, FNP

## 2015-02-08 NOTE — Patient Instructions (Addendum)
Katherine [name] , Thank you for taking time to come for your Medicare Wellness Visit. I appreciate your ongoing commitment to your health goals. Please review the following plan we discussed and let me know if I can assist you in the future.   Needs eye referral;   Did not bring her book with her doctor's in it; goes to Dr. Tommy Medal Will need to go to [facility] ortho doctor for fibro;   Will take PSV 23  in Feb if Dr. Jenny Reichmann feels she needs it since she had one at approx 40;    These are the goals we discussed: Goals    . patient     Wants to be a friend to others and do what I am called to do, which is help people       This is a list of the screening recommended for you and due dates:  Health Maintenance  Topic Date Due  .  Hepatitis C: One time screening is recommended by Center for Disease Control  (CDC) for  adults born from [birth year] through 1965.   70-Jan-1947  . Eye exam for diabetics  08/31/68  . Complete foot exam   11/07/2013  . Pneumonia vaccines (2 of 2 - PPSV23) 12/04/68  . Hemoglobin A1C  06/14/68  . Urine Protein Check  06/15/68  . Flu Shot  08/30/68  . Mammogram  02/04/68  . Tetanus Vaccine  01/26/69  . Colon Cancer Screening  06/12/68  . DEXA scan (bone density measurement)  Completed  . Shingles Vaccine  Completed     Health Maintenance, Female Adopting a healthy lifestyle and getting preventive care can go a long way to promote health and wellness. Talk with your health care provider about what schedule of regular examinations is right for you. This is a good chance for you to check in with your provider about disease prevention and staying healthy. In between checkups, there are plenty of things you can do on your own. Experts have done a lot of research about which lifestyle changes and preventive measures are most likely to keep you healthy. Ask your health care provider for more information. WEIGHT AND DIET  Eat a healthy diet  Be sure to include  plenty of vegetables, fruits, low-fat dairy products, and lean protein.  Do not eat a lot of foods high in solid fats, added sugars, or salt.  Get regular exercise. This is one of the most important things you can do for your health.  Most adults should exercise for at least 150 minutes each week. The exercise should increase your heart rate and make you sweat (moderate-intensity exercise).  Most adults should also do strengthening exercises at least twice a week. This is in addition to the moderate-intensity exercise.  Maintain a healthy weight  Body mass index (BMI) is a measurement that can be used to identify possible weight problems. It estimates body fat based on height and weight. Your health care provider can help determine your BMI and help you achieve or maintain a healthy weight.  For females 70 years of age and older:   A BMI below 18.5 is considered underweight.  A BMI of 18.5 to 24.9 is normal.  A BMI of 25 to 29.9 is considered overweight.  A BMI of 30 and above is considered obese.  Watch levels of cholesterol and blood lipids  You should start having your blood tested for lipids and cholesterol at 70 years of age, then have this test  every 5 years.  You may need to have your cholesterol levels checked more often if:  Your lipid or cholesterol levels are high.  You are older than 70 years of age.  You are at high risk for heart disease.  CANCER SCREENING   Lung Cancer  Lung cancer screening is recommended for adults 6-70 years old who are at high risk for lung cancer because of a history of smoking.  A yearly low-dose CT scan of the lungs is recommended for people who:  Currently smoke.  Have quit within the past 15 years.  Have at least a 30-pack-year history of smoking. A pack year is smoking an average of one pack of cigarettes a day for 1 year.  Yearly screening should continue until it has been 15 years since you quit.  Yearly screening  should stop if you develop a health problem that would prevent you from having lung cancer treatment.  Breast Cancer  Practice breast self-awareness. This means understanding how your breasts normally appear and feel.  It also means doing regular breast self-exams. Let your health care provider know about any changes, no matter how small.  If you are in your 70s or 30s, you should have a clinical breast exam (CBE) by a health care provider every 1-3 years as part of a regular health exam.  If you are 70 or older, have a CBE every year. Also consider having a breast X-ray (mammogram) every year.  If you have a family history of breast cancer, talk to your health care provider about genetic screening.  If you are at high risk for breast cancer, talk to your health care provider about having an MRI and a mammogram every year.  Breast cancer gene (BRCA) assessment is recommended for women who have family members with BRCA-related cancers. BRCA-related cancers include:  Breast.  Ovarian.  Tubal.  Peritoneal cancers.  Results of the assessment will determine the need for genetic counseling and BRCA1 and BRCA2 testing. Cervical Cancer Your health care provider may recommend that you be screened regularly for cancer of the pelvic organs (ovaries, uterus, and vagina). This screening involves a pelvic examination, including checking for microscopic changes to the surface of your cervix (Pap test). You may be encouraged to have this screening done every 3 years, beginning at age 70.  For women ages 70-65, health care providers may recommend pelvic exams and Pap testing every 3 years, or they may recommend the Pap and pelvic exam, combined with testing for human papilloma virus (HPV), every 5 years. Some types of HPV increase your risk of cervical cancer. Testing for HPV may also be done on women of any age with unclear Pap test results.  Other health care providers may not recommend any  screening for nonpregnant women who are considered low risk for pelvic cancer and who do not have symptoms. Ask your health care provider if a screening pelvic exam is right for you.  If you have had past treatment for cervical cancer or a condition that could lead to cancer, you need Pap tests and screening for cancer for at least 20 years after your treatment. If Pap tests have been discontinued, your risk factors (such as having a new sexual partner) need to be reassessed to determine if screening should resume. Some women have medical problems that increase the chance of getting cervical cancer. In these cases, your health care provider may recommend more frequent screening and Pap tests. Colorectal Cancer  This type of  cancer can be detected and often prevented.  Routine colorectal cancer screening usually begins at 70 years of age and continues through 70 years of age.  Your health care provider may recommend screening at an earlier age if you have risk factors for colon cancer.  Your health care provider may also recommend using home test kits to check for hidden blood in the stool.  A small camera at the end of a tube can be used to examine your colon directly (sigmoidoscopy or colonoscopy). This is done to check for the earliest forms of colorectal cancer.  Routine screening usually begins at age 63.  Direct examination of the colon should be repeated every 5-10 years through 69 years of age. However, you may need to be screened more often if early forms of precancerous polyps or small growths are found. Skin Cancer  Check your skin from head to toe regularly.  Tell your health care provider about any new moles or changes in moles, especially if there is a change in a mole's shape or color.  Also tell your health care provider if you have a mole that is larger than the size of a pencil eraser.  Always use sunscreen. Apply sunscreen liberally and repeatedly throughout the  day.  Protect yourself by wearing long sleeves, pants, a wide-brimmed hat, and sunglasses whenever you are outside. HEART DISEASE, DIABETES, AND HIGH BLOOD PRESSURE   High blood pressure causes heart disease and increases the risk of stroke. High blood pressure is more likely to develop in:  People who have blood pressure in the high end of the normal range (130-139/85-89 mm Hg).  People who are overweight or obese.  People who are African American.  If you are 38-59 years of age, have your blood pressure checked every 3-5 years. If you are 30 years of age or older, have your blood pressure checked every year. You should have your blood pressure measured twice--once when you are at a hospital or clinic, and once when you are not at a hospital or clinic. Record the average of the two measurements. To check your blood pressure when you are not at a hospital or clinic, you can use:  An automated blood pressure machine at a pharmacy.  A home blood pressure monitor.  If you are between 38 years and 9 years old, ask your health care provider if you should take aspirin to prevent strokes.  Have regular diabetes screenings. This involves taking a blood sample to check your fasting blood sugar level.  If you are at a normal weight and have a low risk for diabetes, have this test once every three years after 70 years of age.  If you are overweight and have a high risk for diabetes, consider being tested at a younger age or more often. PREVENTING INFECTION  Hepatitis B  If you have a higher risk for hepatitis B, you should be screened for this virus. You are considered at high risk for hepatitis B if:  You were born in a country where hepatitis B is common. Ask your health care provider which countries are considered high risk.  Your parents were born in a high-risk country, and you have not been immunized against hepatitis B (hepatitis B vaccine).  You have HIV or AIDS.  You use needles  to inject street drugs.  You live with someone who has hepatitis B.  You have had sex with someone who has hepatitis B.  You get hemodialysis treatment.  You  take certain medicines for conditions, including cancer, organ transplantation, and autoimmune conditions. Hepatitis C  Blood testing is recommended for:  Everyone born from [birth year] through 1965.  Anyone with known risk factors for hepatitis C. Sexually transmitted infections (STIs)  You should be screened for sexually transmitted infections (STIs) including gonorrhea and chlamydia if:  You are sexually active and are younger than 70 years of age.  You are older than 70 years of age and your health care provider tells you that you are at risk for this type of infection.  Your sexual activity has changed since you were last screened and you are at an increased risk for chlamydia or gonorrhea. Ask your health care provider if you are at risk.  If you do not have HIV, but are at risk, it may be recommended that you take a prescription medicine daily to prevent HIV infection. This is called pre-exposure prophylaxis (PrEP). You are considered at risk if:  You are sexually active and do not regularly use condoms or know the HIV status of your partner(s).  You take drugs by injection.  You are sexually active with a partner who has HIV. Talk with your health care provider about whether you are at high risk of being infected with HIV. If you choose to begin PrEP, you should first be tested for HIV. You should then be tested every 3 months for as long as you are taking PrEP.  PREGNANCY   If you are premenopausal and you may become pregnant, ask your health care provider about preconception counseling.  If you may become pregnant, take 400 to 800 micrograms (mcg) of folic acid every day.  If you want to prevent pregnancy, talk to your health care provider about birth control (contraception). OSTEOPOROSIS AND MENOPAUSE    Osteoporosis is a disease in which the bones lose minerals and strength with aging. This can result in serious bone fractures. Your risk for osteoporosis can be identified using a bone density scan.  If you are 39 years of age or older, or if you are at risk for osteoporosis and fractures, ask your health care provider if you should be screened.  Ask your health care provider whether you should take a calcium or vitamin D supplement to lower your risk for osteoporosis.  Menopause may have certain physical symptoms and risks.  Hormone replacement therapy may reduce some of these symptoms and risks. Talk to your health care provider about whether hormone replacement therapy is right for you.  HOME CARE INSTRUCTIONS   Schedule regular health, dental, and eye exams.  Stay current with your immunizations.   Do not use any tobacco products including cigarettes, chewing tobacco, or electronic cigarettes.  If you are pregnant, do not drink alcohol.  If you are breastfeeding, limit how much and how often you drink alcohol.  Limit alcohol intake to no more than 1 drink per day for nonpregnant women. One drink equals 12 ounces of beer, 5 ounces of wine, or 1 ounces of hard liquor.  Do not use street drugs.  Do not share needles.  Ask your health care provider for help if you need support or information about quitting drugs.  Tell your health care provider if you often feel depressed.  Tell your health care provider if you have ever been abused or do not feel safe at home.   This information is not intended to replace advice given to you by your health care provider. Make sure you discuss any questions  you have with your health care provider.   Document Released: 08/01/2010 Document Revised: 02/06/2014 Document Reviewed: 12/18/2012 Elsevier Interactive Patient Education [employer].

## 2015-02-09 DIAGNOSIS — S72019A Unspecified intracapsular fracture of unspecified femur, initial encounter for closed fracture: Secondary | ICD-10-CM | POA: Diagnosis not present

## 2015-02-10 DIAGNOSIS — S72019A Unspecified intracapsular fracture of unspecified femur, initial encounter for closed fracture: Secondary | ICD-10-CM | POA: Diagnosis not present

## 2015-02-11 DIAGNOSIS — S72019A Unspecified intracapsular fracture of unspecified femur, initial encounter for closed fracture: Secondary | ICD-10-CM | POA: Diagnosis not present

## 2015-02-12 DIAGNOSIS — S72019A Unspecified intracapsular fracture of unspecified femur, initial encounter for closed fracture: Secondary | ICD-10-CM | POA: Diagnosis not present

## 2015-02-13 DIAGNOSIS — S72019A Unspecified intracapsular fracture of unspecified femur, initial encounter for closed fracture: Secondary | ICD-10-CM | POA: Diagnosis not present

## 2015-02-14 DIAGNOSIS — S72019A Unspecified intracapsular fracture of unspecified femur, initial encounter for closed fracture: Secondary | ICD-10-CM | POA: Diagnosis not present

## 2015-02-15 DIAGNOSIS — S72019A Unspecified intracapsular fracture of unspecified femur, initial encounter for closed fracture: Secondary | ICD-10-CM | POA: Diagnosis not present

## 2015-02-16 DIAGNOSIS — S72019A Unspecified intracapsular fracture of unspecified femur, initial encounter for closed fracture: Secondary | ICD-10-CM | POA: Diagnosis not present

## 2015-02-17 DIAGNOSIS — S72019A Unspecified intracapsular fracture of unspecified femur, initial encounter for closed fracture: Secondary | ICD-10-CM | POA: Diagnosis not present

## 2015-02-18 ENCOUNTER — Other Ambulatory Visit: Payer: Self-pay

## 2015-02-18 DIAGNOSIS — S72019A Unspecified intracapsular fracture of unspecified femur, initial encounter for closed fracture: Secondary | ICD-10-CM | POA: Diagnosis not present

## 2015-02-18 MED ORDER — ATORVASTATIN CALCIUM 40 MG PO TABS: 40.0000 mg | ORAL_TABLET | Freq: Every morning | ORAL | Status: DC

## 2015-02-18 MED ORDER — TIZANIDINE HCL 4 MG PO TABS: 4.0000 mg | ORAL_TABLET | Freq: Four times a day (QID) | ORAL | Status: DC | PRN

## 2015-02-18 NOTE — Telephone Encounter (Signed)
[facility] rx rq for tizanidine

## 2015-02-19 ENCOUNTER — Telehealth: Payer: Self-pay | Admitting: Internal Medicine

## 2015-02-19 DIAGNOSIS — S72019A Unspecified intracapsular fracture of unspecified femur, initial encounter for closed fracture: Secondary | ICD-10-CM | POA: Diagnosis not present

## 2015-02-19 MED ORDER — TIZANIDINE HCL 4 MG PO TABS: 4.0000 mg | ORAL_TABLET | Freq: Three times a day (TID) | ORAL | Status: DC | PRN

## 2015-02-19 NOTE — Telephone Encounter (Signed)
zanaflex done erx

## 2015-02-19 NOTE — Telephone Encounter (Signed)
Pt request refill for tiZANidine (ZANAFLEX) 4 MG tablet and Calcium Vit D to be send to [facility] for 90 days supply. Please help

## 2015-02-20 DIAGNOSIS — S72019A Unspecified intracapsular fracture of unspecified femur, initial encounter for closed fracture: Secondary | ICD-10-CM | POA: Diagnosis not present

## 2015-02-21 DIAGNOSIS — S72019A Unspecified intracapsular fracture of unspecified femur, initial encounter for closed fracture: Secondary | ICD-10-CM | POA: Diagnosis not present

## 2015-02-22 DIAGNOSIS — S72019A Unspecified intracapsular fracture of unspecified femur, initial encounter for closed fracture: Secondary | ICD-10-CM | POA: Diagnosis not present

## 2015-02-23 DIAGNOSIS — S72019A Unspecified intracapsular fracture of unspecified femur, initial encounter for closed fracture: Secondary | ICD-10-CM | POA: Diagnosis not present

## 2015-02-24 DIAGNOSIS — S72019A Unspecified intracapsular fracture of unspecified femur, initial encounter for closed fracture: Secondary | ICD-10-CM | POA: Diagnosis not present

## 2015-02-25 ENCOUNTER — Other Ambulatory Visit: Payer: Self-pay

## 2015-02-25 DIAGNOSIS — S72019A Unspecified intracapsular fracture of unspecified femur, initial encounter for closed fracture: Secondary | ICD-10-CM | POA: Diagnosis not present

## 2015-02-25 MED ORDER — CALCIUM CARBONATE-VITAMIN D 500-200 MG-UNIT PO TABS: 1.0000 | ORAL_TABLET | Freq: Three times a day (TID) | ORAL | Status: AC

## 2015-02-26 DIAGNOSIS — S72019A Unspecified intracapsular fracture of unspecified femur, initial encounter for closed fracture: Secondary | ICD-10-CM | POA: Diagnosis not present

## 2015-02-27 DIAGNOSIS — S72019A Unspecified intracapsular fracture of unspecified femur, initial encounter for closed fracture: Secondary | ICD-10-CM | POA: Diagnosis not present

## 2015-02-28 DIAGNOSIS — S72019A Unspecified intracapsular fracture of unspecified femur, initial encounter for closed fracture: Secondary | ICD-10-CM | POA: Diagnosis not present

## 2015-03-01 ENCOUNTER — Ambulatory Visit (INDEPENDENT_AMBULATORY_CARE_PROVIDER_SITE_OTHER): Payer: Medicare Other | Admitting: Podiatry

## 2015-03-01 DIAGNOSIS — B351 Tinea unguium: Secondary | ICD-10-CM

## 2015-03-01 DIAGNOSIS — M79609 Pain in unspecified limb: Secondary | ICD-10-CM

## 2015-03-01 DIAGNOSIS — S72019A Unspecified intracapsular fracture of unspecified femur, initial encounter for closed fracture: Secondary | ICD-10-CM | POA: Diagnosis not present

## 2015-03-01 DIAGNOSIS — M2040 Other hammer toe(s) (acquired), unspecified foot: Secondary | ICD-10-CM

## 2015-03-01 NOTE — Progress Notes (Signed)
Patient ID: Katherine [name], female   DOB: Nov 01, 1945, 70 y.o.   MRN: [id number]  Subjective: 70 y.o. returns the office today for painful, elongated, thickened toenails which she cannot trim herself. Denies any redness or drainage around the nails. Denies any acute changes since last appointment and no new complaints today. Denies any systemic complaints such as fevers, chills, nausea, vomiting.   Objective: AAO 3, NAD DP/PT pulses palpable 2/4, CRT less than 3 seconds Nails hypertrophic, dystrophic, elongated, brittle, discolored 10. There is tenderness overlying the nails 1-5 bilaterally. There is no surrounding erythema or drainage along the nail sites. No open lesions or pre-ulcerative lesions are identified. Hammertoes present.  No other areas of tenderness bilateral lower extremities. No overlying edema, erythema, increased warmth. No pain with calf compression, swelling, warmth, erythema.  Assessment: Patient presents with symptomatic onychomycosis  Plan: -Treatment options including alternatives, risks, complications were discussed -Nails sharply debrided 10 without complication/bleeding. -Offloading pads for hammertoe -Discussed daily foot inspection. If there are any changes, to call the office immediately.  -Follow-up in 3 months or sooner if any problems are to arise. In the meantime, encouraged to call the office with any questions, concerns, changes symptoms.  Ovid Curd, DPM

## 2015-03-02 DIAGNOSIS — S72019A Unspecified intracapsular fracture of unspecified femur, initial encounter for closed fracture: Secondary | ICD-10-CM | POA: Diagnosis not present

## 2015-03-03 ENCOUNTER — Telehealth: Payer: Self-pay | Admitting: Internal Medicine

## 2015-03-03 DIAGNOSIS — S72019A Unspecified intracapsular fracture of unspecified femur, initial encounter for closed fracture: Secondary | ICD-10-CM | POA: Diagnosis not present

## 2015-03-03 NOTE — Telephone Encounter (Signed)
Called and left patient a message to give [place] a call back about her medications.

## 2015-03-03 NOTE — Telephone Encounter (Signed)
Pt called and she was talking about her Potassium.  I see she is not due for anything and I couldn't get exactly what she needed. Can you please give her a call and see if there is anything she needed. I talked to dahlia and sometimes pt gets confused.

## 2015-03-04 DIAGNOSIS — S72019A Unspecified intracapsular fracture of unspecified femur, initial encounter for closed fracture: Secondary | ICD-10-CM | POA: Diagnosis not present

## 2015-03-05 DIAGNOSIS — S72019A Unspecified intracapsular fracture of unspecified femur, initial encounter for closed fracture: Secondary | ICD-10-CM | POA: Diagnosis not present

## 2015-03-06 DIAGNOSIS — S72019A Unspecified intracapsular fracture of unspecified femur, initial encounter for closed fracture: Secondary | ICD-10-CM | POA: Diagnosis not present

## 2015-03-07 DIAGNOSIS — S72019A Unspecified intracapsular fracture of unspecified femur, initial encounter for closed fracture: Secondary | ICD-10-CM | POA: Diagnosis not present

## 2015-03-08 DIAGNOSIS — S72019A Unspecified intracapsular fracture of unspecified femur, initial encounter for closed fracture: Secondary | ICD-10-CM | POA: Diagnosis not present

## 2015-03-09 DIAGNOSIS — S72019A Unspecified intracapsular fracture of unspecified femur, initial encounter for closed fracture: Secondary | ICD-10-CM | POA: Diagnosis not present

## 2015-03-10 DIAGNOSIS — S72019A Unspecified intracapsular fracture of unspecified femur, initial encounter for closed fracture: Secondary | ICD-10-CM | POA: Diagnosis not present

## 2015-03-11 DIAGNOSIS — S72019A Unspecified intracapsular fracture of unspecified femur, initial encounter for closed fracture: Secondary | ICD-10-CM | POA: Diagnosis not present

## 2015-03-12 DIAGNOSIS — S72019A Unspecified intracapsular fracture of unspecified femur, initial encounter for closed fracture: Secondary | ICD-10-CM | POA: Diagnosis not present

## 2015-03-12 LAB — HM DIABETES EYE EXAM

## 2015-03-13 DIAGNOSIS — S72019A Unspecified intracapsular fracture of unspecified femur, initial encounter for closed fracture: Secondary | ICD-10-CM | POA: Diagnosis not present

## 2015-03-14 DIAGNOSIS — S72019A Unspecified intracapsular fracture of unspecified femur, initial encounter for closed fracture: Secondary | ICD-10-CM | POA: Diagnosis not present

## 2015-03-15 DIAGNOSIS — Z124 Encounter for screening for malignant neoplasm of cervix: Secondary | ICD-10-CM | POA: Diagnosis not present

## 2015-03-15 DIAGNOSIS — S72019A Unspecified intracapsular fracture of unspecified femur, initial encounter for closed fracture: Secondary | ICD-10-CM | POA: Diagnosis not present

## 2015-03-15 DIAGNOSIS — Z01419 Encounter for gynecological examination (general) (routine) without abnormal findings: Secondary | ICD-10-CM | POA: Diagnosis not present

## 2015-03-15 DIAGNOSIS — Z1231 Encounter for screening mammogram for malignant neoplasm of breast: Secondary | ICD-10-CM | POA: Diagnosis not present

## 2015-03-15 DIAGNOSIS — E119 Type 2 diabetes mellitus without complications: Secondary | ICD-10-CM | POA: Diagnosis not present

## 2015-03-16 DIAGNOSIS — S72019A Unspecified intracapsular fracture of unspecified femur, initial encounter for closed fracture: Secondary | ICD-10-CM | POA: Diagnosis not present

## 2015-03-17 DIAGNOSIS — S72019A Unspecified intracapsular fracture of unspecified femur, initial encounter for closed fracture: Secondary | ICD-10-CM | POA: Diagnosis not present

## 2015-03-18 ENCOUNTER — Ambulatory Visit (INDEPENDENT_AMBULATORY_CARE_PROVIDER_SITE_OTHER): Payer: Medicare Other | Admitting: Internal Medicine

## 2015-03-18 ENCOUNTER — Encounter: Payer: Self-pay | Admitting: Internal Medicine

## 2015-03-18 VITALS — BP 112/80 | HR 64 | Resp 20 | Wt 171.0 lb

## 2015-03-18 DIAGNOSIS — E119 Type 2 diabetes mellitus without complications: Secondary | ICD-10-CM | POA: Diagnosis not present

## 2015-03-18 DIAGNOSIS — E785 Hyperlipidemia, unspecified: Secondary | ICD-10-CM | POA: Diagnosis not present

## 2015-03-18 DIAGNOSIS — I10 Essential (primary) hypertension: Secondary | ICD-10-CM | POA: Diagnosis not present

## 2015-03-18 DIAGNOSIS — S72019A Unspecified intracapsular fracture of unspecified femur, initial encounter for closed fracture: Secondary | ICD-10-CM | POA: Diagnosis not present

## 2015-03-18 NOTE — Patient Instructions (Signed)
Please continue all other medications as before, including the zyrtec and the nasal spray for allergies  Please have the pharmacy call with any other refills you may need.  Please continue your efforts at being more active, low cholesterol diabetic diet, and weight control.  Please keep your appointments with your specialists as you may have planned  Please return in 3 months, or sooner if needed

## 2015-03-18 NOTE — Progress Notes (Signed)
Pre visit review using our clinic review tool, if applicable. No additional management support is needed unless otherwise documented below in the visit note. 

## 2015-03-18 NOTE — Progress Notes (Signed)
Subjective:    Patient ID: Katherine [name], female    DOB: Apr 07, 1945, 70 y.o.   MRN: [id number]  HPI  Here to f/u; overall doing ok,  Pt denies chest pain, increasing sob or doe, wheezing, orthopnea, PND, increased LE swelling, palpitations, dizziness or syncope.  Pt denies new neurological symptoms such as new headache, or facial or extremity weakness or numbness.  Pt denies polydipsia, polyuria, or low sugar episode.   Pt denies new neurological symptoms such as new headache, or facial or extremity weakness or numbness.   Pt states overall good compliance with meds, mostly trying to follow appropriate diet, with wt overall stable,  but little exercise however.  Does have several wks ongoing nasal allergy symptoms with clearish congestion, itch and sneezing, without fever, pain, ST, cough, swelling or wheezing  , has not been taking the zyrtec and only rhinocort occas. Past Medical History  Diagnosis Date  . ABDOMINAL PAIN, LOWER 02/04/2010  . ABNORMAL THYROID FUNCTION TESTS 08/04/2009  . ALLERGIC RHINITIS 09/05/2006  . ANXIETY 07/31/2007  . DEPRESSION 09/05/2006  . DIVERTICULOSIS, COLON 09/05/2006  . Dysuria 04/15/2010  . Failure to thrive in childhood 04/06/2010  . FATIGUE 04/15/2010  . FIBROMYALGIA 05/10/2009  . FRACTURE, PELVIS, RIGHT 04/15/2010  . GASTROENTERITIS, ACUTE 12/25/2008  . GERD 09/05/2006  . HYPERLIPIDEMIA 09/05/2006  . HYPERTENSION 09/05/2006  . KNEE PAIN, LEFT 02/04/2010  . OSTEOARTHRITIS 09/05/2006  . OSTEOPOROSIS 07/31/2007  . Other specified forms of hearing loss 11/01/2009  . SINUSITIS- ACUTE-NOS 10/09/2007  . SKIN LESION 05/10/2009  . SPRAIN&STRAIN OTH SPEC SITES SHOULDER&UPPER ARM 11/01/2009  . Unspecified hearing loss 10/09/2007  . URI 02/20/2007  . UTI 02/20/2007  . Blood transfusion   . Cataract     resolved -surgery done  . Heart murmur   . DIABETES MELLITUS, TYPE II 07/31/2007    no meds  . MRSA infection 10/26/2014  . Diabetic foot ulcer (HCC) 10/26/2014   Past Surgical History    Procedure Laterality Date  . Tonsillectomy    . Breast biopsy    . Abdominal hysterectomy  12/08    Bladder tac, partial hysterectomy  . Inguinal herniorrhapy      right Dr. Freida Busman 06/2009  . Catarct removal      both eyes  . Total knee arthroplasty Left 12/20/2012    Procedure: LEFT TOTAL KNEE ARTHROPLASTY;  Surgeon: Kathryne Hitch, MD;  Location: [facility];  Service: Orthopedics;  Laterality: Left;  . I&d knee with poly exchange Left 01/03/2013    Procedure: IRRIGATION AND DEBRIDEMENT LEFT KNEE WITH POLY EXCHANGE;  Surgeon: Kathryne Hitch, MD;  Location: [facility];  Service: Orthopedics;  Laterality: Left;    reports that she has never smoked. She does not have any smokeless tobacco history on file. She reports that she does not drink alcohol or use illicit drugs. family history includes Coronary artery disease in her other; Diabetes in her other. There is no history of Colon cancer, Esophageal cancer, Stomach cancer, or Rectal cancer. Allergies  Allergen Reactions  . Penicillins     "made me nervous"  . Thioridazine Hcl Itching   Current Outpatient Prescriptions on File Prior to Visit  Medication Sig Dispense Refill  . albuterol (PROVENTIL HFA;VENTOLIN HFA) 108 (90 BASE) MCG/ACT inhaler Inhale 2 puffs into the lungs every 6 (six) hours as needed for wheezing or shortness of breath. 8.5 g 5  . alendronate (FOSAMAX) 70 MG tablet Take 1 tablet (70 mg total) by mouth every  7 (seven) days. Take with a full glass of water on an empty stomach. 4 tablet 11  . ALPRAZolam (XANAX) 0.5 MG tablet Take 0.5 mg by mouth 2 (two) times daily as needed for anxiety.    [name] amLODipine (NORVASC) 10 MG tablet Take 0.5 tablets (5 mg total) by mouth every morning. 90 tablet 3  . aspirin EC 325 MG EC tablet Take 1 tablet (325 mg total) by mouth 2 (two) times daily after a meal. 45 tablet 0  . atorvastatin (LIPITOR) 40 MG tablet Take 1 tablet (40 mg total) by mouth every morning. 90 tablet 3  .  azelastine (ASTELIN) 0.1 % nasal spray Place 1 spray into both nostrils 2 (two) times daily. Use in each nostril as directed 30 mL 11  . benztropine (COGENTIN) 0.5 MG tablet Take 0.5 mg by mouth 2 (two) times daily.      . budesonide (RHINOCORT AQUA) 32 MCG/ACT nasal spray Place 2 sprays into both nostrils daily. 8.6 g 5  . busPIRone (BUSPAR) 10 MG tablet Take 1 tablet (10 mg total) by mouth 3 (three) times daily. 60 tablet 11  . calcium-vitamin D (OSCAL WITH D) 500-200 MG-UNIT tablet Take 1 tablet by mouth 3 (three) times daily. 270 tablet 3  . cetirizine (ZYRTEC) 10 MG tablet TAKE 1 TABLET BY MOUTH DAILY 30 tablet 11  . diclofenac sodium (VOLTAREN) 1 % GEL Apply topically 4 (four) times daily.    . Diclofenac Sodium 1.5 % SOLN     . etodolac (LODINE) 400 MG tablet Take 400 mg by mouth 2 (two) times daily.    . fluticasone (FLONASE) 50 MCG/ACT nasal spray Place 2 sprays into both nostrils daily. 16 g 5  . glucose blood (ONETOUCH VERIO) test strip 1 each by Other route 2 (two) times daily. Use to check blood sugars twice a day Dx E11.9    . glucose blood (ONETOUCH VERIO) test strip 1 each by Other route 2 (two) times daily. Use to check blood sugar twice a day Dx E11.9 180 each 3  . hydrOXYzine (ATARAX/VISTARIL) 10 MG tablet Take 1 tablet (10 mg total) by mouth 2 (two) times daily as needed. 30 tablet 11  . ibuprofen (ADVIL,MOTRIN) 800 MG tablet Take 1 tablet (800 mg total) by mouth 3 (three) times daily. 42 tablet 0  . ipratropium (ATROVENT) 0.06 % nasal spray Place 1 spray into both nostrils 3 (three) times daily. 45 mL 3  . omeprazole (PRILOSEC) 20 MG capsule     . ondansetron (ZOFRAN) 4 MG tablet Take 1 tablet (4 mg total) by mouth every 8 (eight) hours as needed for nausea or vomiting. 40 tablet 1  . ONE TOUCH LANCETS MISC Use as directed once daily to check blood sugar.  Diagnosis code 250.00 200 each 11  . ONE TOUCH LANCETS MISC Use to check blood sugars twice a day Dx E11.9 200 each 1    . pantoprazole (PROTONIX) 40 MG tablet Take 1 tablet (40 mg total) by mouth daily. 90 tablet 3  . potassium chloride SA (K-DUR,KLOR-CON) 20 MEQ tablet Take 1 tablet (20 mEq total) by mouth 2 (two) times daily. 180 tablet 3  . pregabalin (LYRICA) 75 MG capsule Take 1 capsule (75 mg total) by mouth 2 (two) times daily. 60 capsule 5  . promethazine (PHENERGAN) 12.5 MG tablet TAKE 1 TABLET BY MOUTH EVERY 8 HOURS AS NEEDED FOR NAUSEA AND VOMITING 40 tablet 0  . risperiDONE (RISPERDAL) 3 MG tablet Take 3 mg  by mouth at bedtime.     [name] tiZANidine (ZANAFLEX) 4 MG tablet Take 1 tablet (4 mg total) by mouth every 8 (eight) hours as needed for muscle spasms. 270 tablet 1  . triamcinolone (NASACORT AQ) 55 MCG/ACT AERO nasal inhaler Place 2 sprays into the nose daily. 3 Inhaler 3   No current facility-administered medications on file prior to visit.   Review of Systems  Constitutional: Negative for unusual diaphoresis or night sweats HENT: Negative for ringing in ear or discharge Eyes: Negative for double vision or worsening visual disturbance.  Respiratory: Negative for choking and stridor.   Gastrointestinal: Negative for vomiting or other signifcant bowel change Genitourinary: Negative for hematuria or change in urine volume.  Musculoskeletal: Negative for other MSK pain or swelling Skin: Negative for color change and worsening wound.  Neurological: Negative for tremors and numbness other than noted  Psychiatric/Behavioral: Negative for decreased concentration or agitation other than above       Objective:   Physical Exam BP 112/80 mmHg  Pulse 64  Resp 20  Wt 171 lb (77.565 kg)  SpO2 97% VS noted,  Constitutional: Pt appears in no significant distress HENT: Head: NCAT.  Right Ear: External ear normal.  Left Ear: External ear normal.  Eyes: . Pupils are equal, round, and reactive to light. Conjunctivae and EOM are normal Neck: Normal range of motion. Neck supple.  Cardiovascular: Normal  rate and regular rhythm.   Pulmonary/Chest: Effort normal and breath sounds without rales or wheezing.  Abd:  Soft, NT, ND, + BS Neurological: Pt is alert. Not confused , motor grossly intact Skin: Skin is warm. No rash, no LE edema Psychiatric: Pt behavior is normal. No agitation.     Assessment & Plan:

## 2015-03-19 DIAGNOSIS — S72019A Unspecified intracapsular fracture of unspecified femur, initial encounter for closed fracture: Secondary | ICD-10-CM | POA: Diagnosis not present

## 2015-03-20 DIAGNOSIS — S72019A Unspecified intracapsular fracture of unspecified femur, initial encounter for closed fracture: Secondary | ICD-10-CM | POA: Diagnosis not present

## 2015-03-21 DIAGNOSIS — S72019A Unspecified intracapsular fracture of unspecified femur, initial encounter for closed fracture: Secondary | ICD-10-CM | POA: Diagnosis not present

## 2015-03-21 NOTE — Assessment & Plan Note (Signed)
stable overall by history and exam, recent data reviewed with pt, and pt to continue medical treatment as before,  to f/u any worsening symptoms or concerns Lab Results  Component Value Date   LDLCALC 69 12/16/2014    

## 2015-03-21 NOTE — Assessment & Plan Note (Signed)
stable overall by history and exam, recent data reviewed with pt, and pt to continue medical treatment as before,  to f/u any worsening symptoms or concerns Lab Results  Component Value Date   HGBA1C 6.0 12/16/2014    

## 2015-03-21 NOTE — Assessment & Plan Note (Signed)
stable overall by history and exam, recent data reviewed with pt, and pt to continue medical treatment as before,  to f/u any worsening symptoms or concerns BP Readings from Last 3 Encounters:  03/18/15 112/80  02/08/15 130/80  01/10/15 123/81

## 2015-03-22 DIAGNOSIS — S72019A Unspecified intracapsular fracture of unspecified femur, initial encounter for closed fracture: Secondary | ICD-10-CM | POA: Diagnosis not present

## 2015-03-23 ENCOUNTER — Other Ambulatory Visit: Payer: Self-pay

## 2015-03-23 DIAGNOSIS — S72019A Unspecified intracapsular fracture of unspecified femur, initial encounter for closed fracture: Secondary | ICD-10-CM | POA: Diagnosis not present

## 2015-03-23 MED ORDER — ONDANSETRON HCL 4 MG PO TABS: 4.0000 mg | ORAL_TABLET | Freq: Three times a day (TID) | ORAL | Status: DC | PRN

## 2015-03-24 DIAGNOSIS — S72019A Unspecified intracapsular fracture of unspecified femur, initial encounter for closed fracture: Secondary | ICD-10-CM | POA: Diagnosis not present

## 2015-03-25 DIAGNOSIS — S72019A Unspecified intracapsular fracture of unspecified femur, initial encounter for closed fracture: Secondary | ICD-10-CM | POA: Diagnosis not present

## 2015-03-26 DIAGNOSIS — S72019A Unspecified intracapsular fracture of unspecified femur, initial encounter for closed fracture: Secondary | ICD-10-CM | POA: Diagnosis not present

## 2015-03-27 DIAGNOSIS — S72019A Unspecified intracapsular fracture of unspecified femur, initial encounter for closed fracture: Secondary | ICD-10-CM | POA: Diagnosis not present

## 2015-04-02 ENCOUNTER — Encounter: Payer: Self-pay | Admitting: Internal Medicine

## 2015-04-04 DIAGNOSIS — S72019A Unspecified intracapsular fracture of unspecified femur, initial encounter for closed fracture: Secondary | ICD-10-CM | POA: Diagnosis not present

## 2015-04-05 DIAGNOSIS — S72019A Unspecified intracapsular fracture of unspecified femur, initial encounter for closed fracture: Secondary | ICD-10-CM | POA: Diagnosis not present

## 2015-04-06 DIAGNOSIS — S72019A Unspecified intracapsular fracture of unspecified femur, initial encounter for closed fracture: Secondary | ICD-10-CM | POA: Diagnosis not present

## 2015-04-07 DIAGNOSIS — S72019A Unspecified intracapsular fracture of unspecified femur, initial encounter for closed fracture: Secondary | ICD-10-CM | POA: Diagnosis not present

## 2015-04-08 DIAGNOSIS — S72019A Unspecified intracapsular fracture of unspecified femur, initial encounter for closed fracture: Secondary | ICD-10-CM | POA: Diagnosis not present

## 2015-04-09 DIAGNOSIS — S72019A Unspecified intracapsular fracture of unspecified femur, initial encounter for closed fracture: Secondary | ICD-10-CM | POA: Diagnosis not present

## 2015-04-10 DIAGNOSIS — S72019A Unspecified intracapsular fracture of unspecified femur, initial encounter for closed fracture: Secondary | ICD-10-CM | POA: Diagnosis not present

## 2015-05-20 ENCOUNTER — Telehealth: Payer: Self-pay

## 2015-05-20 NOTE — Telephone Encounter (Signed)
Left message advising patient to call back to schedule nurse visit for prolia injection---insurance has been verified with estimated copay $0 for prolia injection

## 2015-06-03 ENCOUNTER — Other Ambulatory Visit: Payer: Self-pay | Admitting: Internal Medicine

## 2015-06-07 ENCOUNTER — Other Ambulatory Visit: Payer: Self-pay | Admitting: Internal Medicine

## 2015-06-08 ENCOUNTER — Ambulatory Visit: Payer: Medicare Other | Admitting: Podiatry

## 2015-06-15 ENCOUNTER — Ambulatory Visit: Payer: Medicare Other | Admitting: Internal Medicine

## 2015-06-15 ENCOUNTER — Ambulatory Visit (INDEPENDENT_AMBULATORY_CARE_PROVIDER_SITE_OTHER): Payer: Medicare Other | Admitting: Internal Medicine

## 2015-06-15 ENCOUNTER — Other Ambulatory Visit (INDEPENDENT_AMBULATORY_CARE_PROVIDER_SITE_OTHER): Payer: Medicare Other

## 2015-06-15 ENCOUNTER — Encounter: Payer: Self-pay | Admitting: Internal Medicine

## 2015-06-15 VITALS — BP 130/70 | HR 71 | Temp 98.3°F | Resp 20 | Wt 171.0 lb

## 2015-06-15 DIAGNOSIS — Z1159 Encounter for screening for other viral diseases: Secondary | ICD-10-CM | POA: Diagnosis not present

## 2015-06-15 DIAGNOSIS — E785 Hyperlipidemia, unspecified: Secondary | ICD-10-CM

## 2015-06-15 DIAGNOSIS — Z0001 Encounter for general adult medical examination with abnormal findings: Secondary | ICD-10-CM | POA: Diagnosis not present

## 2015-06-15 DIAGNOSIS — E119 Type 2 diabetes mellitus without complications: Secondary | ICD-10-CM

## 2015-06-15 DIAGNOSIS — I10 Essential (primary) hypertension: Secondary | ICD-10-CM

## 2015-06-15 DIAGNOSIS — I350 Nonrheumatic aortic (valve) stenosis: Secondary | ICD-10-CM

## 2015-06-15 DIAGNOSIS — R6889 Other general symptoms and signs: Secondary | ICD-10-CM

## 2015-06-15 LAB — URINALYSIS, ROUTINE W REFLEX MICROSCOPIC
Bilirubin Urine: NEGATIVE
Hgb urine dipstick: NEGATIVE
Ketones, ur: NEGATIVE
Nitrite: NEGATIVE
Specific Gravity, Urine: <=1.005 — AB (ref 1.000–1.030)
TOTAL PROTEIN, URINE-UPE24: NEGATIVE
Urine Glucose: NEGATIVE
Urobilinogen, UA: 0.2 (ref 0.0–1.0)
pH: 6 (ref 5.0–8.0)

## 2015-06-15 LAB — BASIC METABOLIC PANEL
BUN: 22 mg/dL (ref 6–23)
CHLORIDE: 104 meq/L (ref 96–112)
CO2: 28 meq/L (ref 19–32)
CREATININE: 0.73 mg/dL (ref 0.40–1.20)
Calcium: 9.3 mg/dL (ref 8.4–10.5)
GFR: 83.82 mL/min (ref >60.00)
GLUCOSE: 101 mg/dL — AB (ref 70–99)
Potassium: 4 mEq/L (ref 3.5–5.1)
Sodium: 139 mEq/L (ref 135–145)

## 2015-06-15 LAB — CBC WITH DIFFERENTIAL/PLATELET
BASOS PCT: 0.4 % (ref 0.0–3.0)
Basophils Absolute: 0 10*3/uL (ref 0.0–0.1)
Eosinophils Absolute: 0.2 10*3/uL (ref 0.0–0.7)
Eosinophils Relative: 3.1 % (ref 0.0–5.0)
HEMATOCRIT: 38.6 % (ref 36.0–46.0)
Hemoglobin: 12.9 g/dL (ref 12.0–15.0)
LYMPHS PCT: 30.1 % (ref 12.0–46.0)
Lymphs Abs: 2.3 10*3/uL (ref 0.7–4.0)
MCHC: 33.5 g/dL (ref 30.0–36.0)
MCV: 89.9 fl (ref 78.0–100.0)
MONO ABS: 0.7 10*3/uL (ref 0.1–1.0)
Monocytes Relative: 9.2 % (ref 3.0–12.0)
NEUTROS ABS: 4.4 10*3/uL (ref 1.4–7.7)
Neutrophils Relative %: 57.2 % (ref 43.0–77.0)
PLATELETS: 268 10*3/uL (ref 150.0–400.0)
RBC: 4.29 Mil/uL (ref 3.87–5.11)
RDW: 13.6 % (ref 11.5–15.5)
WBC: 7.6 10*3/uL (ref 4.0–10.5)

## 2015-06-15 LAB — HEMOGLOBIN A1C: HEMOGLOBIN A1C: 6.2 % (ref 4.6–6.5)

## 2015-06-15 LAB — LIPID PANEL
CHOL/HDL RATIO: 4
CHOLESTEROL: 140 mg/dL (ref 0–200)
HDL: 39 mg/dL — AB (ref >39.00)
LDL Cholesterol: 72 mg/dL (ref 0–99)
NonHDL: 101.28
TRIGLYCERIDES: 148 mg/dL (ref 0.0–149.0)
VLDL: 29.6 mg/dL (ref 0.0–40.0)

## 2015-06-15 LAB — MICROALBUMIN / CREATININE URINE RATIO
CREATININE, U: 14 mg/dL
Microalb Creat Ratio: 5 mg/g (ref 0.0–30.0)

## 2015-06-15 LAB — HEPATIC FUNCTION PANEL
ALT: 26 U/L (ref 0–35)
AST: 27 U/L (ref 0–37)
Albumin: 4.4 g/dL (ref 3.5–5.2)
Alkaline Phosphatase: 61 U/L (ref 39–117)
Bilirubin, Direct: 0.1 mg/dL (ref 0.0–0.3)
TOTAL PROTEIN: 6.9 g/dL (ref 6.0–8.3)
Total Bilirubin: 0.5 mg/dL (ref 0.2–1.2)

## 2015-06-15 LAB — TSH: TSH: 0.61 u[IU]/mL (ref 0.35–4.50)

## 2015-06-15 NOTE — Progress Notes (Signed)
Pre visit review using our clinic review tool, if applicable. No additional management support is needed unless otherwise documented below in the visit note. 

## 2015-06-15 NOTE — Assessment & Plan Note (Signed)
stable overall by history and exam, recent data reviewed with pt, and pt to continue medical treatment as before,  to f/u any worsening symptoms or concerns Lab Results  Component Value Date   LDLCALC 69 12/16/2014    

## 2015-06-15 NOTE — Assessment & Plan Note (Signed)

## 2015-06-15 NOTE — Assessment & Plan Note (Signed)
Loud murmur, but mild AS only by recent echo and asympt, stable overall by history and exam, recent data reviewed with pt, and pt to continue medical treatment as before,  to f/u any worsening symptoms or concerns

## 2015-06-15 NOTE — Progress Notes (Signed)
Subjective:    Patient ID: Katherine [name], female    DOB: [birth year], 70 y.o.   MRN: [id number]  HPI  Here for wellness and f/u;  Overall doing ok;  Pt denies Chest pain, worsening SOB, DOE, wheezing, orthopnea, PND, worsening LE edema, palpitations, dizziness or syncope.  Pt denies neurological change such as new headache, facial or extremity weakness.  Pt denies polydipsia, polyuria, or low sugar symptoms. Pt states overall good compliance with treatment and medications, good tolerability, and has been trying to follow appropriate diet.  Pt denies worsening depressive symptoms, suicidal ideation or panic. No fever, night sweats, wt loss, loss of appetite, or other constitutional symptoms.  Pt states good ability with ADL's, has low fall risk, home safety reviewed and adequate, no other significant changes in hearing or vision, and only occasionally active with exercise.  [facility].  Due for Hep c screen.  CBg's doing well, recent a1c per home testing 5.2.  Cont's to control with diet. Pt continues to have recurring LBP without change in severity, bowel or bladder change, fever, wt loss,  worsening LE pain/numbness/weakness, gait change or falls, believes her FMS has been more active recently. Denies worsening depressive symptoms, suicidal ideation, or panic; has ongoing anxiety, not increased recently.  Past Medical History  Diagnosis Date  . ABDOMINAL PAIN, LOWER 02/04/2010  . ABNORMAL THYROID FUNCTION TESTS 08/04/2009  . ALLERGIC RHINITIS 09/05/2006  . ANXIETY 07/31/2007  . DEPRESSION 09/05/2006  . DIVERTICULOSIS, COLON 09/05/2006  . Dysuria 04/15/2010  . Failure to thrive in childhood 04/06/2010  . FATIGUE 04/15/2010  . FIBROMYALGIA 05/10/2009  . FRACTURE, PELVIS, RIGHT 04/15/2010  . GASTROENTERITIS, ACUTE 12/25/2008  . GERD 09/05/2006  . HYPERLIPIDEMIA 09/05/2006  . HYPERTENSION 09/05/2006  . KNEE PAIN, LEFT 02/04/2010  . OSTEOARTHRITIS 09/05/2006  . OSTEOPOROSIS 07/31/2007  . Other  specified forms of hearing loss 11/01/2009  . SINUSITIS- ACUTE-NOS 10/09/2007  . SKIN LESION 05/10/2009  . SPRAIN&STRAIN OTH SPEC SITES SHOULDER&UPPER ARM 11/01/2009  . Unspecified hearing loss 10/09/2007  . URI 02/20/2007  . UTI 02/20/2007  . Blood transfusion   . Cataract     resolved -surgery done  . Heart murmur   . DIABETES MELLITUS, TYPE II 07/31/2007    no meds  . MRSA infection 10/26/2014  . Diabetic foot ulcer (HCC) 10/26/2014   Past Surgical History  Procedure Laterality Date  . Tonsillectomy    . Breast biopsy    . Abdominal hysterectomy  12/08    Bladder tac, partial hysterectomy  . Inguinal herniorrhapy      right Dr. Freida Busman 06/2009  . Catarct removal      both eyes  . Total knee arthroplasty Left 12/20/2012    Procedure: LEFT TOTAL KNEE ARTHROPLASTY;  Surgeon: Kathryne Hitch, MD;  Location: [facility];  Service: Orthopedics;  Laterality: Left;  . I&d knee with poly exchange Left 01/03/2013    Procedure: IRRIGATION AND DEBRIDEMENT LEFT KNEE WITH POLY EXCHANGE;  Surgeon: Kathryne Hitch, MD;  Location: [facility];  Service: Orthopedics;  Laterality: Left;    reports that she has never smoked. She does not have any smokeless tobacco history on file. She reports that she does not drink alcohol or use illicit drugs. family history includes Coronary artery disease in her other; Diabetes in her other. There is no history of Colon cancer, Esophageal cancer, Stomach cancer, or Rectal cancer. Allergies  Allergen Reactions  . Penicillins     "made me nervous"  .  Thioridazine Hcl Itching   Current Outpatient Prescriptions on File Prior to Visit  Medication Sig Dispense Refill  . albuterol (PROVENTIL HFA;VENTOLIN HFA) 108 (90 BASE) MCG/ACT inhaler Inhale 2 puffs into the lungs every 6 (six) hours as needed for wheezing or shortness of breath. 8.5 g 5  . alendronate (FOSAMAX) 70 MG tablet Take 1 tablet (70 mg total) by mouth every 7 (seven) days. Take with a full glass of water on an  empty stomach. 4 tablet 11  . ALPRAZolam (XANAX) 0.5 MG tablet Take 0.5 mg by mouth 2 (two) times daily as needed for anxiety.    [name]. amLODipine (NORVASC) 10 MG tablet Take 0.5 tablets (5 mg total) by mouth every morning. 90 tablet 3  . aspirin EC 325 MG EC tablet Take 1 tablet (325 mg total) by mouth 2 (two) times daily after a meal. 45 tablet 0  . atorvastatin (LIPITOR) 40 MG tablet Take 1 tablet (40 mg total) by mouth every morning. 90 tablet 3  . azelastine (ASTELIN) 0.1 % nasal spray Place 1 spray into both nostrils 2 (two) times daily. Use in each nostril as directed 30 mL 11  . benztropine (COGENTIN) 0.5 MG tablet Take 0.5 mg by mouth 2 (two) times daily.      . budesonide (RHINOCORT AQUA) 32 MCG/ACT nasal spray Place 2 sprays into both nostrils daily. 8.6 g 5  . busPIRone (BUSPAR) 10 MG tablet Take 1 tablet (10 mg total) by mouth 3 (three) times daily. 60 tablet 11  . calcium-vitamin D (OSCAL WITH D) 500-200 MG-UNIT tablet Take 1 tablet by mouth 3 (three) times daily. 270 tablet 3  . cetirizine (ZYRTEC) 10 MG tablet TAKE 1 TABLET BY MOUTH DAILY 30 tablet 11  . diclofenac sodium (VOLTAREN) 1 % GEL Apply topically 4 (four) times daily.    . Diclofenac Sodium 1.5 % SOLN     . etodolac (LODINE) 400 MG tablet Take 400 mg by mouth 2 (two) times daily.    . fluticasone (FLONASE) 50 MCG/ACT nasal spray Place 2 sprays into both nostrils daily. 16 g 5  . glucose blood (ONETOUCH VERIO) test strip 1 each by Other route 2 (two) times daily. Use to check blood sugars twice a day Dx E11.9    . glucose blood (ONETOUCH VERIO) test strip 1 each by Other route 2 (two) times daily. Use to check blood sugar twice a day Dx E11.9 180 each 3  . hydrOXYzine (ATARAX/VISTARIL) 10 MG tablet Take 1 tablet (10 mg total) by mouth 2 (two) times daily as needed. 30 tablet 11  . ibuprofen (ADVIL,MOTRIN) 800 MG tablet Take 1 tablet (800 mg total) by mouth 3 (three) times daily. 42 tablet 0  . ipratropium (ATROVENT) 0.06 %  nasal spray Place 1 spray into both nostrils 3 (three) times daily. 45 mL 3  . omeprazole (PRILOSEC) 20 MG capsule     . ondansetron (ZOFRAN) 4 MG tablet TAKE 1 TABLET BY MOUTH EVERY 8 HOURS AS NEEDED FOR NAUSEA OR VOMITING 40 tablet 0  . ONE TOUCH LANCETS MISC Use as directed once daily to check blood sugar.  Diagnosis code 250.00 200 each 11  . ONE TOUCH LANCETS MISC Use to check blood sugars twice a day Dx E11.9 200 each 1  . ONETOUCH DELICA LANCETS 33G MISC CHECK BLOOD SUGAR ONCE DAILY AS DIRECTED 100 each 0  . pantoprazole (PROTONIX) 40 MG tablet Take 1 tablet (40 mg total) by mouth daily. 90 tablet 3  .  potassium chloride SA (K-DUR,KLOR-CON) 20 MEQ tablet Take 1 tablet (20 mEq total) by mouth 2 (two) times daily. 180 tablet 3  . pregabalin (LYRICA) 75 MG capsule Take 1 capsule (75 mg total) by mouth 2 (two) times daily. 60 capsule 5  . promethazine (PHENERGAN) 12.5 MG tablet TAKE 1 TABLET BY MOUTH EVERY 8 HOURS AS NEEDED FOR NAUSEA AND VOMITING 40 tablet 0  . risperiDONE (RISPERDAL) 3 MG tablet Take 3 mg by mouth at bedtime.     [name] tiZANidine (ZANAFLEX) 4 MG tablet Take 1 tablet (4 mg total) by mouth every 8 (eight) hours as needed for muscle spasms. 270 tablet 1  . triamcinolone (NASACORT AQ) 55 MCG/ACT AERO nasal inhaler Place 2 sprays into the nose daily. 3 Inhaler 3   No current facility-administered medications on file prior to visit.   Review of Systems Constitutional: Negative for increased diaphoresis, or other activity, appetite or siginficant weight change other than noted HENT: Negative for worsening hearing loss, ear pain, facial swelling, mouth sores and neck stiffness.   Eyes: Negative for other worsening pain, redness or visual disturbance.  Respiratory: Negative for choking or stridor Cardiovascular: Negative for other chest pain and palpitations.  Gastrointestinal: Negative for worsening diarrhea, blood in stool, or abdominal distention Genitourinary: Negative for  hematuria, flank pain or change in urine volume.  Musculoskeletal: Negative for myalgias or other joint complaints.  Skin: Negative for other color change and wound or drainage.  Neurological: Negative for syncope and numbness. other than noted Hematological: Negative for adenopathy. or other swelling Psychiatric/Behavioral: Negative for hallucinations, SI, self-injury, decreased concentration or other worsening agitation.      Objective:   Physical Exam BP 130/70 mmHg  Pulse 71  Temp(Src) 98.3 F (36.8 C) (Oral)  Resp 20  Wt 171 lb (77.565 kg)  SpO2 91% VS noted,  Constitutional: Pt is oriented to person, place, and time. Appears well-developed and well-nourished, in no significant distress Head: Normocephalic and atraumatic  Eyes: Conjunctivae and EOM are normal. Pupils are equal, round, and reactive to light Right Ear: External ear normal.  Left Ear: External ear normal Nose: Nose normal.  Mouth/Throat: Oropharynx is clear and moist  Neck: Normal range of motion. Neck supple. No JVD present. No tracheal deviation present or significant neck LA or mass Cardiovascular: Normal rate, regular rhythm, normal heart sounds and intact distal pulses. Except for loud RUSB blowing murmur   Pulmonary/Chest: Effort normal and breath sounds without rales or wheezing  Abdominal: Soft. Bowel sounds are normal. NT. No HSM  Musculoskeletal: Normal range of motion. Exhibits no edema Lymphadenopathy: Has no cervical adenopathy.  Neurological: Pt is alert and oriented to person, place, and time. Pt has normal reflexes. No cranial nerve deficit. Motor grossly intact Skin: Skin is warm and dry. No rash noted or new ulcers Psychiatric:  Has mild nervous mood and affect. Behavior is normal.  Most recent echo summary only:   *[facility]*  [address]  [place], [place] [address]   [phone]  ------------------------------------------------------------------- Transthoracic Echocardiography  Patient: Katherine, [name] MR #: [id number] Study Date: 09/29/2014 Gender: F Age: 30 Height: 147.3 cm Weight: 75.8 kg BSA: 1.8 m^2 Pt. Status: Room:  Joya Salm SONOGRAPHER Aida Raider, RDCS ATTENDING Tobias Alexander, M.D. PERFORMING [facility], Outpatient  cc:  ------------------------------------------------------------------- LV EF: 60% - 65%  ------------------------------------------------------------------- Indications: I35.0 Aortic Stenosis.  ------------------------------------------------------------------- History: PMH: Acquired from the patient and from the patient&'s chart. PMH: Murmur.  Fibromyalgia. Risk factors: Hypertension. Diabetes mellitus. Obese. Dyslipidemia.  ------------------------------------------------------------------- Study Conclusions  - Left ventricle: The cavity size was normal. Systolic function was  normal. The estimated ejection fraction was in the range of 60%  to 65%. Wall motion was normal; there were no regional wall  motion abnormalities. Doppler parameters are consistent with  abnormal left ventricular relaxation (grade 1 diastolic  dysfunction). - Aortic valve: Bicuspid; severely thickened, severely calcified  leaflets. Valve mobility was restricted. There was mild stenosis. - Aortic root: The aortic root was normal in size. - Ascending aorta: The ascending aorta was normal in size. - Mitral valve: Structurally normal valve. There was no  regurgitation. - Left atrium: The atrium was normal in size. - Right ventricle: The cavity size was normal. Wall thickness was  normal. Systolic function was normal. - Right atrium: The atrium was normal in size. - Tricuspid valve: There was mild-moderate  regurgitation. - Pulmonic valve: There was mild regurgitation. - Pulmonary arteries: Systolic pressure was within the normal  range. - Inferior vena cava: The vessel was normal in size. - Pericardium, extracardiac: There was no pericardial effusion.  Impressions:  - When compared to the prior study from 05/27/2012 there is no  significant difference, aortic stenosis is still mild.     Assessment & Plan:

## 2015-06-15 NOTE — Patient Instructions (Addendum)

## 2015-06-15 NOTE — Assessment & Plan Note (Addendum)
stable overall by history and exam, recent data reviewed with pt, and pt to continue medical treatment as before,  to f/u any worsening symptoms or concerns Lab Results  Component Value Date   HGBA1C 6.0 12/16/2014   In addition to the time spent performing CPE, I spent an additional 15 minutes face to face,in which greater than 50% of this time was spent in counseling and coordination of care for patient's illness as documented.

## 2015-06-15 NOTE — Assessment & Plan Note (Signed)
stable overall by history and exam, recent data reviewed with pt, and pt to continue medical treatment as before,  to f/u any worsening symptoms or concerns BP Readings from Last 3 Encounters:  06/15/15 130/70  03/18/15 112/80  02/08/15 130/80

## 2015-06-22 ENCOUNTER — Other Ambulatory Visit: Payer: Self-pay | Admitting: Internal Medicine

## 2015-06-24 ENCOUNTER — Ambulatory Visit (INDEPENDENT_AMBULATORY_CARE_PROVIDER_SITE_OTHER): Payer: Medicare Other

## 2015-06-24 DIAGNOSIS — M810 Age-related osteoporosis without current pathological fracture: Secondary | ICD-10-CM | POA: Diagnosis not present

## 2015-06-24 MED ORDER — DENOSUMAB 60 MG/ML ~~LOC~~ SOLN
60.0000 mg | Freq: Once | SUBCUTANEOUS | Status: AC
  Administered 2015-06-24: 60 mg via SUBCUTANEOUS

## 2015-07-09 ENCOUNTER — Encounter: Payer: Self-pay | Admitting: Podiatry

## 2015-07-09 ENCOUNTER — Ambulatory Visit (INDEPENDENT_AMBULATORY_CARE_PROVIDER_SITE_OTHER): Payer: Medicare Other | Admitting: Podiatry

## 2015-07-09 DIAGNOSIS — M79609 Pain in unspecified limb: Secondary | ICD-10-CM

## 2015-07-09 DIAGNOSIS — B351 Tinea unguium: Secondary | ICD-10-CM | POA: Diagnosis not present

## 2015-07-09 DIAGNOSIS — M79676 Pain in unspecified toe(s): Secondary | ICD-10-CM | POA: Diagnosis not present

## 2015-07-09 NOTE — Progress Notes (Signed)
Patient ID: Katherine QueenPamela A Deman, female   DOB: 03/24/1945, 70 y.o.   MRN: [id number] Complaint:  Visit Type: Patient returns to my office for continued preventative foot care services. Complaint: Patient states" my nails have grown long and thick and become painful to walk and wear shoes" Patient has been diagnosed with DM with no foot complications. The patient presents for preventative foot care services. No changes to ROS  Podiatric Exam: Vascular: dorsalis pedis and posterior tibial pulses are palpable bilateral. Capillary return is immediate. Temperature gradient is WNL. Skin turgor WNL  Sensorium: Normal Semmes Weinstein monofilament test. Normal tactile sensation bilaterally. Nail Exam: Pt has thick disfigured discolored nails with subungual debris noted bilateral entire nail hallux through fifth toenails Ulcer Exam: There is no evidence of ulcer or pre-ulcerative changes or infection. Orthopedic Exam: Muscle tone and strength are WNL. No limitations in general ROM. No crepitus or effusions noted. Foot type and digits show no abnormalities. Bony prominences are unremarkable. Skin: No Porokeratosis. No infection or ulcers  Diagnosis:  Onychomycosis, , Pain in right toe, pain in left toes  Treatment & Plan Procedures and Treatment: Consent by patient was obtained for treatment procedures. The patient understood the discussion of treatment and procedures well. All questions were answered thoroughly reviewed. Debridement of mycotic and hypertrophic toenails, 1 through 5 bilateral and clearing of subungual debris. No ulceration, no infection noted.  Return Visit-Office Procedure: Patient instructed to return to the office for a follow up visit 3 months for continued evaluation and treatment.    Helane GuntherGregory Emran Molzahn DPM

## 2015-07-26 ENCOUNTER — Other Ambulatory Visit: Payer: Self-pay | Admitting: Internal Medicine

## 2015-08-24 ENCOUNTER — Telehealth: Payer: Self-pay | Admitting: Internal Medicine

## 2015-08-24 NOTE — Telephone Encounter (Signed)
Pt request to speak to the assistant concern about prolia injection. Pt stated she had one 6 month ago. Please call her back

## 2015-08-30 NOTE — Telephone Encounter (Signed)
Katherine [name] please call pt back, she will be home all day today

## 2015-08-31 NOTE — Telephone Encounter (Signed)
Talked with patient and she was wanting to know when her next prolia injection is scheduled----she is due in late November, I advised patient we will call her late October or early November after insurance verification to schedule next prolia injection

## 2015-09-21 ENCOUNTER — Encounter: Payer: Self-pay | Admitting: Internal Medicine

## 2015-09-21 ENCOUNTER — Ambulatory Visit (INDEPENDENT_AMBULATORY_CARE_PROVIDER_SITE_OTHER): Payer: Medicare Other | Admitting: Internal Medicine

## 2015-09-21 VITALS — BP 130/80 | HR 99 | Temp 98.9°F | Resp 20 | Wt 156.0 lb

## 2015-09-21 DIAGNOSIS — E785 Hyperlipidemia, unspecified: Secondary | ICD-10-CM

## 2015-09-21 DIAGNOSIS — E119 Type 2 diabetes mellitus without complications: Secondary | ICD-10-CM

## 2015-09-21 DIAGNOSIS — I10 Essential (primary) hypertension: Secondary | ICD-10-CM | POA: Diagnosis not present

## 2015-09-21 NOTE — Assessment & Plan Note (Signed)
stable overall by history and exam, recent data reviewed with pt, and pt to continue medical treatment as before,  to f/u any worsening symptoms or concerns Lab Results  Component Value Date   LDLCALC 72 06/15/2015    

## 2015-09-21 NOTE — Progress Notes (Signed)
Subjective:    Patient ID: Katherine [name], female    DOB: 09/01/1945, 70 y.o.   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  Pt denies new neurological symptoms such as new headache, or facial or extremity weakness or numbness.  Pt denies polydipsia, polyuria, or low sugar episode.   Pt denies new neurological symptoms such as new headache, or facial or extremity weakness or numbness.   Pt states overall good compliance with meds, mostly trying to follow appropriate diet, with wt overall stable,  but little exercise however.  Does have several wks ongoing nasal allergy symptoms with clearish congestion, itch and sneezing, without fever, pain, ST, cough, swelling or wheezing, has not been taking her medications.   Past Medical History:  Diagnosis Date  . ABDOMINAL PAIN, LOWER 02/04/2010  . ABNORMAL THYROID FUNCTION TESTS 08/04/2009  . ALLERGIC RHINITIS 09/05/2006  . ANXIETY 07/31/2007  . Blood transfusion   . Cataract    resolved -surgery done  . DEPRESSION 09/05/2006  . DIABETES MELLITUS, TYPE II 07/31/2007   no meds  . Diabetic foot ulcer (HCC) 10/26/2014  . DIVERTICULOSIS, COLON 09/05/2006  . Dysuria 04/15/2010  . Failure to thrive in childhood 04/06/2010  . FATIGUE 04/15/2010  . FIBROMYALGIA 05/10/2009  . FRACTURE, PELVIS, RIGHT 04/15/2010  . GASTROENTERITIS, ACUTE 12/25/2008  . GERD 09/05/2006  . Heart murmur   . HYPERLIPIDEMIA 09/05/2006  . HYPERTENSION 09/05/2006  . KNEE PAIN, LEFT 02/04/2010  . MRSA infection 10/26/2014  . OSTEOARTHRITIS 09/05/2006  . OSTEOPOROSIS 07/31/2007  . Other specified forms of hearing loss 11/01/2009  . SINUSITIS- ACUTE-NOS 10/09/2007  . SKIN LESION 05/10/2009  . SPRAIN&STRAIN OTH SPEC SITES SHOULDER&UPPER ARM 11/01/2009  . Unspecified hearing loss 10/09/2007  . URI 02/20/2007  . UTI  02/20/2007   Past Surgical History:  Procedure Laterality Date  . ABDOMINAL HYSTERECTOMY  12/08   Bladder tac, partial hysterectomy  . BREAST BIOPSY    . catarct removal     both eyes  . I&D KNEE WITH POLY EXCHANGE Left 01/03/2013   Procedure: IRRIGATION AND DEBRIDEMENT LEFT KNEE WITH POLY EXCHANGE;  Surgeon: Kathryne Hitchhristopher Y Blackman, MD;  Location: [facility];  Service: Orthopedics;  Laterality: Left;  . inguinal herniorrhapy     right Dr. Freida BusmanAllen 06/2009  . TONSILLECTOMY    . TOTAL KNEE ARTHROPLASTY Left 12/20/2012   Procedure: LEFT TOTAL KNEE ARTHROPLASTY;  Surgeon: Kathryne Hitchhristopher Y Blackman, MD;  Location: [facility];  Service: Orthopedics;  Laterality: Left;    reports that she has never smoked. She does not have any smokeless tobacco history on file. She reports that she does not drink alcohol or use drugs. family history includes Coronary artery disease in her other; Diabetes in her other. Allergies  Allergen Reactions  . Penicillins     "made me nervous"  . Thioridazine Hcl Itching   Current Outpatient Prescriptions on File Prior to Visit  Medication Sig Dispense Refill  . albuterol (PROVENTIL HFA;VENTOLIN HFA) 108 (90 BASE) MCG/ACT inhaler Inhale 2 puffs into the lungs every 6 (six) hours as needed for wheezing or shortness of breath. 8.5 g 5  . alendronate (FOSAMAX) 70 MG tablet TAKE 1 TABLET BY MOUTH EVERY 7 DAYS. TAKE WITH A FULL GLASS OF WATER ON AN EMPTY STOMACH. 4 tablet 11  .  ALPRAZolam (XANAX) 0.5 MG tablet Take 0.5 mg by mouth 2 (two) times daily as needed for anxiety.    [name]. amLODipine (NORVASC) 10 MG tablet Take 0.5 tablets (5 mg total) by mouth every morning. 90 tablet 3  . aspirin EC 325 MG EC tablet Take 1 tablet (325 mg total) by mouth 2 (two) times daily after a meal. 45 tablet 0  . atorvastatin (LIPITOR) 40 MG tablet Take 1 tablet (40 mg total) by mouth every morning. 90 tablet 3  . azelastine (ASTELIN) 0.1 % nasal spray Place 1 spray into both nostrils 2 (two) times daily. Use  in each nostril as directed 30 mL 11  . benztropine (COGENTIN) 0.5 MG tablet Take 0.5 mg by mouth 2 (two) times daily.      . budesonide (RHINOCORT AQUA) 32 MCG/ACT nasal spray Place 2 sprays into both nostrils daily. 8.6 g 5  . busPIRone (BUSPAR) 10 MG tablet Take 1 tablet (10 mg total) by mouth 3 (three) times daily. 60 tablet 11  . calcium-vitamin D (OSCAL WITH D) 500-200 MG-UNIT tablet Take 1 tablet by mouth 3 (three) times daily. 270 tablet 3  . cetirizine (ZYRTEC) 10 MG tablet TAKE 1 TABLET BY MOUTH DAILY 30 tablet 11  . diclofenac sodium (VOLTAREN) 1 % GEL Apply topically 4 (four) times daily.    . Diclofenac Sodium 1.5 % SOLN     . etodolac (LODINE) 400 MG tablet Take 400 mg by mouth 2 (two) times daily.    . fluticasone (FLONASE) 50 MCG/ACT nasal spray USE 2 SPRAYS IN EACH NOSTRIL EVERY DAY 16 g 11  . glucose blood (ONETOUCH VERIO) test strip 1 each by Other route 2 (two) times daily. Use to check blood sugars twice a day Dx E11.9    . glucose blood (ONETOUCH VERIO) test strip 1 each by Other route 2 (two) times daily. Use to check blood sugar twice a day Dx E11.9 180 each 3  . hydrOXYzine (ATARAX/VISTARIL) 10 MG tablet Take 1 tablet (10 mg total) by mouth 2 (two) times daily as needed. 30 tablet 11  . ibuprofen (ADVIL,MOTRIN) 800 MG tablet Take 1 tablet (800 mg total) by mouth 3 (three) times daily. 42 tablet 0  . ipratropium (ATROVENT) 0.06 % nasal spray USE 1 SPRAY IN EACH NOSTRIL THREE TIMES DAILY 30 mL 11  . omeprazole (PRILOSEC) 20 MG capsule     . ondansetron (ZOFRAN) 4 MG tablet TAKE 1 TABLET BY MOUTH EVERY 8 HOURS AS NEEDED FOR NAUSEA OR VOMITING 40 tablet 0  . ONE TOUCH LANCETS MISC Use as directed once daily to check blood sugar.  Diagnosis code 250.00 200 each 11  . ONE TOUCH LANCETS MISC Use to check blood sugars twice a day Dx E11.9 200 each 1  . ONETOUCH DELICA LANCETS 33G MISC CHECK BLOOD SUGAR ONCE DAILY AS DIRECTED 100 each 0  . pantoprazole (PROTONIX) 40 MG tablet  Take 1 tablet (40 mg total) by mouth daily. 90 tablet 3  . potassium chloride SA (K-DUR,KLOR-CON) 20 MEQ tablet Take 1 tablet (20 mEq total) by mouth 2 (two) times daily. 180 tablet 3  . pregabalin (LYRICA) 75 MG capsule Take 1 capsule (75 mg total) by mouth 2 (two) times daily. 60 capsule 5  . promethazine (PHENERGAN) 12.5 MG tablet TAKE 1 TABLET BY MOUTH EVERY 8 HOURS AS NEEDED FOR NAUSEA AND VOMITING 40 tablet 0  . risperiDONE (RISPERDAL) 3 MG tablet Take 3 mg by mouth at bedtime.     .[name]  tiZANidine (ZANAFLEX) 4 MG tablet Take 1 tablet (4 mg total) by mouth every 8 (eight) hours as needed for muscle spasms. 270 tablet 1  . triamcinolone (NASACORT AQ) 55 MCG/ACT AERO nasal inhaler Place 2 sprays into the nose daily. 3 Inhaler 3   No current facility-administered medications on file prior to visit.     Review of Systems  Constitutional: Negative for unusual diaphoresis or night sweats HENT: Negative for ear swelling or discharge Eyes: Negative for worsening visual haziness  Respiratory: Negative for choking and stridor.   Gastrointestinal: Negative for distension or worsening eructation Genitourinary: Negative for retention or change in urine volume.  Musculoskeletal: Negative for other MSK pain or swelling Skin: Negative for color change and worsening wound Neurological: Negative for tremors and numbness other than noted  Psychiatric/Behavioral: Negative for decreased concentration or agitation other than above       Objective:   Physical Exam BP 130/80   Pulse 99   Temp 98.9 F (37.2 C) (Oral)   Resp 20   Wt 156 lb (70.8 kg)   SpO2 96%   BMI 32.60 kg/m  VS noted, not ill appearing Constitutional: Pt appears in no apparent distress HENT: Head: NCAT.  Right Ear: External ear normal.  Left Ear: External ear normal.  Eyes: . Pupils are equal, round, and reactive to light. Conjunctivae and EOM are normal Neck: Normal range of motion. Neck supple.  Cardiovascular: Normal rate and  regular rhythm.   Pulmonary/Chest: Effort normal and breath sounds without rales or wheezing.  Abd:  Soft, NT, ND, + BS Neurological: Pt is alert. Not confused , motor grossly intact Skin: Skin is warm. No rash, no LE edema Psychiatric: Pt behavior is normal. No agitation.     Assessment & Plan:

## 2015-09-21 NOTE — Progress Notes (Signed)
Pre visit review using our clinic review tool, if applicable. No additional management support is needed unless otherwise documented below in the visit note. 

## 2015-09-21 NOTE — Patient Instructions (Signed)
Please continue all other medications as before, and refills have been done if requested.  Please have the pharmacy call with any other refills you may need.  Please continue your efforts at being more active, low cholesterol diet, and weight control.  You are otherwise up to date with prevention measures today.  Please keep your appointments with your specialists as you may have planned  Please return in 3 months, or sooner if needed, with Lab testing done 3-5 days before  

## 2015-09-21 NOTE — Assessment & Plan Note (Signed)
stable overall by history and exam, recent data reviewed with pt, and pt to continue medical treatment as before,  to f/u any worsening symptoms or concerns Lab Results  Component Value Date   HGBA1C 6.2 06/15/2015

## 2015-09-21 NOTE — Assessment & Plan Note (Signed)
stable overall by history and exam, recent data reviewed with pt, and pt to continue medical treatment as before,  to f/u any worsening symptoms or concerns BP Readings from Last 3 Encounters:  09/21/15 130/80  06/15/15 130/70  03/18/15 112/80

## 2015-09-23 ENCOUNTER — Observation Stay (HOSPITAL_COMMUNITY)
Admission: EM | Admit: 2015-09-23 | Discharge: 2015-09-25 | Disposition: A | Discharge status: Home Health | Payer: Medicare Other | Attending: Family Medicine | Admitting: Family Medicine

## 2015-09-23 ENCOUNTER — Emergency Department (HOSPITAL_COMMUNITY): Payer: Medicare Other

## 2015-09-23 ENCOUNTER — Encounter (HOSPITAL_COMMUNITY): Payer: Self-pay | Admitting: *Deleted

## 2015-09-23 DIAGNOSIS — R4182 Altered mental status, unspecified: Secondary | ICD-10-CM | POA: Insufficient documentation

## 2015-09-23 DIAGNOSIS — M797 Fibromyalgia: Secondary | ICD-10-CM | POA: Insufficient documentation

## 2015-09-23 DIAGNOSIS — K219 Gastro-esophageal reflux disease without esophagitis: Secondary | ICD-10-CM | POA: Diagnosis not present

## 2015-09-23 DIAGNOSIS — N390 Urinary tract infection, site not specified: Secondary | ICD-10-CM

## 2015-09-23 DIAGNOSIS — Z7982 Long term (current) use of aspirin: Secondary | ICD-10-CM | POA: Insufficient documentation

## 2015-09-23 DIAGNOSIS — I10 Essential (primary) hypertension: Secondary | ICD-10-CM | POA: Diagnosis not present

## 2015-09-23 DIAGNOSIS — I700 Atherosclerosis of aorta: Secondary | ICD-10-CM | POA: Insufficient documentation

## 2015-09-23 DIAGNOSIS — J309 Allergic rhinitis, unspecified: Secondary | ICD-10-CM | POA: Diagnosis present

## 2015-09-23 DIAGNOSIS — M810 Age-related osteoporosis without current pathological fracture: Secondary | ICD-10-CM | POA: Diagnosis not present

## 2015-09-23 DIAGNOSIS — R531 Weakness: Secondary | ICD-10-CM | POA: Diagnosis not present

## 2015-09-23 DIAGNOSIS — A419 Sepsis, unspecified organism: Principal | ICD-10-CM | POA: Insufficient documentation

## 2015-09-23 DIAGNOSIS — R000 Tachycardia, unspecified: Secondary | ICD-10-CM | POA: Insufficient documentation

## 2015-09-23 DIAGNOSIS — E785 Hyperlipidemia, unspecified: Secondary | ICD-10-CM | POA: Diagnosis not present

## 2015-09-23 DIAGNOSIS — J189 Pneumonia, unspecified organism: Secondary | ICD-10-CM | POA: Diagnosis not present

## 2015-09-23 DIAGNOSIS — E860 Dehydration: Secondary | ICD-10-CM | POA: Insufficient documentation

## 2015-09-23 DIAGNOSIS — F411 Generalized anxiety disorder: Secondary | ICD-10-CM | POA: Diagnosis not present

## 2015-09-23 DIAGNOSIS — Z96652 Presence of left artificial knee joint: Secondary | ICD-10-CM | POA: Diagnosis not present

## 2015-09-23 DIAGNOSIS — N179 Acute kidney failure, unspecified: Secondary | ICD-10-CM | POA: Insufficient documentation

## 2015-09-23 DIAGNOSIS — E119 Type 2 diabetes mellitus without complications: Secondary | ICD-10-CM | POA: Insufficient documentation

## 2015-09-23 DIAGNOSIS — F419 Anxiety disorder, unspecified: Secondary | ICD-10-CM | POA: Insufficient documentation

## 2015-09-23 DIAGNOSIS — M1990 Unspecified osteoarthritis, unspecified site: Secondary | ICD-10-CM | POA: Insufficient documentation

## 2015-09-23 DIAGNOSIS — Z90710 Acquired absence of both cervix and uterus: Secondary | ICD-10-CM | POA: Diagnosis not present

## 2015-09-23 DIAGNOSIS — E870 Hyperosmolality and hypernatremia: Secondary | ICD-10-CM | POA: Diagnosis not present

## 2015-09-23 DIAGNOSIS — Z880 Allergy status to penicillin: Secondary | ICD-10-CM | POA: Insufficient documentation

## 2015-09-23 LAB — I-STAT VENOUS BLOOD GAS, ED
Acid-Base Excess: 2 mmol/L (ref 0.0–2.0)
BICARBONATE: 26 meq/L — AB (ref 20.0–24.0)
O2 Saturation: 99 %
PH VEN: 7.468 — AB (ref 7.250–7.300)
TCO2: 27 mmol/L (ref 0–100)
pCO2, Ven: 35.9 mmHg — ABNORMAL LOW (ref 45.0–50.0)
pO2, Ven: 119 mmHg — ABNORMAL HIGH (ref 31.0–45.0)

## 2015-09-23 LAB — URINALYSIS, ROUTINE W REFLEX MICROSCOPIC
GLUCOSE, UA: NEGATIVE mg/dL
Ketones, ur: 15 mg/dL — AB
LEUKOCYTES UA: NEGATIVE
Nitrite: NEGATIVE
PH: 6 (ref 5.0–8.0)
PROTEIN: 100 mg/dL — AB
SPECIFIC GRAVITY, URINE: 1.027 (ref 1.005–1.030)

## 2015-09-23 LAB — CBC WITH DIFFERENTIAL/PLATELET
Basophils Absolute: 0 10*3/uL (ref 0.0–0.1)
Basophils Relative: 0 %
EOS ABS: 0 10*3/uL (ref 0.0–0.7)
Eosinophils Relative: 0 %
HCT: 51.8 % — ABNORMAL HIGH (ref 36.0–46.0)
HEMOGLOBIN: 16.8 g/dL — AB (ref 12.0–15.0)
LYMPHS ABS: 2.5 10*3/uL (ref 0.7–4.0)
LYMPHS PCT: 16 %
MCH: 31.1 pg (ref 26.0–34.0)
MCHC: 32.4 g/dL (ref 30.0–36.0)
MCV: 95.9 fL (ref 78.0–100.0)
Monocytes Absolute: 1 10*3/uL (ref 0.1–1.0)
Monocytes Relative: 7 %
NEUTROS ABS: 11.9 10*3/uL — AB (ref 1.7–7.7)
NEUTROS PCT: 77 %
Platelets: 336 10*3/uL (ref 150–400)
RBC: 5.4 MIL/uL — AB (ref 3.87–5.11)
RDW: 14.5 % (ref 11.5–15.5)
WBC: 15.4 10*3/uL — AB (ref 4.0–10.5)

## 2015-09-23 LAB — URINE MICROSCOPIC-ADD ON

## 2015-09-23 LAB — I-STAT CG4 LACTIC ACID, ED: Lactic Acid, Venous: 2.74 mmol/L (ref 0.5–1.9)

## 2015-09-23 LAB — COMPREHENSIVE METABOLIC PANEL
ALBUMIN: 5.2 g/dL — AB (ref 3.5–5.0)
ALK PHOS: 51 U/L (ref 38–126)
ALT: 52 U/L (ref 14–54)
ANION GAP: 14 (ref 5–15)
AST: 112 U/L — AB (ref 15–41)
BILIRUBIN TOTAL: 1.7 mg/dL — AB (ref 0.3–1.2)
BUN: 34 mg/dL — ABNORMAL HIGH (ref 6–20)
CHLORIDE: 120 mmol/L — AB (ref 101–111)
CO2: 20 mmol/L — AB (ref 22–32)
Calcium: 9.3 mg/dL (ref 8.9–10.3)
Creatinine, Ser: 0.98 mg/dL (ref 0.44–1.00)
GFR calc Af Amer: >60 mL/min (ref >60)
GFR, EST NON AFRICAN AMERICAN: 57 mL/min — AB (ref >60)
GLUCOSE: 153 mg/dL — AB (ref 65–99)
POTASSIUM: 3.8 mmol/L (ref 3.5–5.1)
Sodium: 154 mmol/L — ABNORMAL HIGH (ref 135–145)
Total Protein: 8.3 g/dL — ABNORMAL HIGH (ref 6.5–8.1)

## 2015-09-23 LAB — CBG MONITORING, ED: GLUCOSE-CAPILLARY: 140 mg/dL — AB (ref 65–99)

## 2015-09-23 LAB — I-STAT TROPONIN, ED: Troponin i, poc: 0.04 ng/mL (ref 0.00–0.08)

## 2015-09-23 LAB — LACTIC ACID, PLASMA: Lactic Acid, Venous: 1.2 mmol/L (ref 0.5–1.9)

## 2015-09-23 LAB — PROCALCITONIN: Procalcitonin: <0.1 ng/mL

## 2015-09-23 LAB — AMMONIA: AMMONIA: 37 umol/L — AB (ref 9–35)

## 2015-09-23 MED ORDER — ATORVASTATIN CALCIUM 40 MG PO TABS
40.0000 mg | ORAL_TABLET | Freq: Every morning | ORAL | Status: DC
  Administered 2015-09-24 – 2015-09-25 (×2): 40 mg via ORAL
  Filled 2015-09-23 (×2): qty 1

## 2015-09-23 MED ORDER — RISPERIDONE 3 MG PO TABS
3.0000 mg | ORAL_TABLET | Freq: Every day | ORAL | Status: DC
  Administered 2015-09-23 – 2015-09-24 (×2): 3 mg via ORAL
  Filled 2015-09-23 (×3): qty 1

## 2015-09-23 MED ORDER — DEXTROSE-NACL 5-0.45 % IV SOLN
INTRAVENOUS | Status: DC
  Administered 2015-09-23 – 2015-09-24 (×2): via INTRAVENOUS

## 2015-09-23 MED ORDER — AZELASTINE HCL 0.1 % NA SOLN
1.0000 | Freq: Two times a day (BID) | NASAL | Status: DC
  Administered 2015-09-23 – 2015-09-25 (×4): 1 via NASAL
  Filled 2015-09-23: qty 30

## 2015-09-23 MED ORDER — ONDANSETRON HCL 4 MG/2ML IJ SOLN
4.0000 mg | Freq: Four times a day (QID) | INTRAMUSCULAR | Status: DC | PRN
  Administered 2015-09-24: 4 mg via INTRAVENOUS
  Filled 2015-09-23: qty 2

## 2015-09-23 MED ORDER — AMLODIPINE BESYLATE 5 MG PO TABS
5.0000 mg | ORAL_TABLET | Freq: Every morning | ORAL | Status: DC
  Administered 2015-09-24 – 2015-09-25 (×2): 5 mg via ORAL
  Filled 2015-09-23 (×2): qty 1

## 2015-09-23 MED ORDER — ALBUTEROL SULFATE (2.5 MG/3ML) 0.083% IN NEBU: 3.0000 mL | INHALATION_SOLUTION | Freq: Four times a day (QID) | RESPIRATORY_TRACT | Status: DC | PRN

## 2015-09-23 MED ORDER — PANTOPRAZOLE SODIUM 40 MG PO TBEC
40.0000 mg | DELAYED_RELEASE_TABLET | Freq: Every day | ORAL | Status: DC
  Administered 2015-09-24 – 2015-09-25 (×2): 40 mg via ORAL
  Filled 2015-09-23 (×2): qty 1

## 2015-09-23 MED ORDER — PREGABALIN 75 MG PO CAPS
75.0000 mg | ORAL_CAPSULE | Freq: Two times a day (BID) | ORAL | Status: DC
  Administered 2015-09-23 – 2015-09-25 (×4): 75 mg via ORAL
  Filled 2015-09-23 (×4): qty 1

## 2015-09-23 MED ORDER — DEXTROSE 5 % IV SOLN
1.0000 g | Freq: Once | INTRAVENOUS | Status: AC
  Administered 2015-09-23: 1 g via INTRAVENOUS
  Filled 2015-09-23: qty 10

## 2015-09-23 MED ORDER — SODIUM CHLORIDE 0.9 % IV BOLUS (SEPSIS)
1000.0000 mL | Freq: Once | INTRAVENOUS | Status: AC
  Administered 2015-09-23: 1000 mL via INTRAVENOUS

## 2015-09-23 MED ORDER — BENZTROPINE MESYLATE 1 MG PO TABS
0.5000 mg | ORAL_TABLET | Freq: Two times a day (BID) | ORAL | Status: DC
  Administered 2015-09-23 – 2015-09-25 (×4): 0.5 mg via ORAL
  Filled 2015-09-23 (×4): qty 1

## 2015-09-23 MED ORDER — BUSPIRONE HCL 5 MG PO TABS
10.0000 mg | ORAL_TABLET | Freq: Three times a day (TID) | ORAL | Status: DC
  Administered 2015-09-23 – 2015-09-25 (×6): 10 mg via ORAL
  Filled 2015-09-23 (×6): qty 2

## 2015-09-23 MED ORDER — ENOXAPARIN SODIUM 40 MG/0.4ML ~~LOC~~ SOLN
40.0000 mg | SUBCUTANEOUS | Status: DC
  Administered 2015-09-23 – 2015-09-24 (×2): 40 mg via SUBCUTANEOUS
  Filled 2015-09-23 (×2): qty 0.4

## 2015-09-23 MED ORDER — CALCIUM CARBONATE-VITAMIN D 500-200 MG-UNIT PO TABS
1.0000 | ORAL_TABLET | Freq: Three times a day (TID) | ORAL | Status: DC
  Administered 2015-09-24 – 2015-09-25 (×6): 1 via ORAL
  Filled 2015-09-23 (×6): qty 1

## 2015-09-23 MED ORDER — ACETAMINOPHEN 325 MG PO TABS: 650.0000 mg | ORAL_TABLET | Freq: Once | ORAL | Status: DC | PRN

## 2015-09-23 MED ORDER — DEXTROSE 5 % IV SOLN
500.0000 mg | INTRAVENOUS | Status: DC
  Administered 2015-09-24: 500 mg via INTRAVENOUS
  Filled 2015-09-23: qty 500

## 2015-09-23 MED ORDER — FLUTICASONE PROPIONATE 50 MCG/ACT NA SUSP
2.0000 | Freq: Every day | NASAL | Status: DC
  Administered 2015-09-24 – 2015-09-25 (×2): 2 via NASAL
  Filled 2015-09-23: qty 16

## 2015-09-23 MED ORDER — LORATADINE 10 MG PO TABS
10.0000 mg | ORAL_TABLET | Freq: Every day | ORAL | Status: DC
  Administered 2015-09-24 – 2015-09-25 (×2): 10 mg via ORAL
  Filled 2015-09-23 (×2): qty 1

## 2015-09-23 MED ORDER — DEXTROSE 5 % IV SOLN
1.0000 g | INTRAVENOUS | Status: DC
  Administered 2015-09-24: 1 g via INTRAVENOUS
  Filled 2015-09-23 (×2): qty 10

## 2015-09-23 MED ORDER — AZITHROMYCIN 500 MG IV SOLR
500.0000 mg | Freq: Once | INTRAVENOUS | Status: AC
  Administered 2015-09-23: 500 mg via INTRAVENOUS
  Filled 2015-09-23: qty 500

## 2015-09-23 MED ORDER — ACETAMINOPHEN 500 MG PO TABS
1000.0000 mg | ORAL_TABLET | Freq: Once | ORAL | Status: AC
  Administered 2015-09-23: 1000 mg via ORAL
  Filled 2015-09-23: qty 2

## 2015-09-23 MED ORDER — ALPRAZOLAM 0.5 MG PO TABS
0.5000 mg | ORAL_TABLET | Freq: Two times a day (BID) | ORAL | Status: DC | PRN
  Administered 2015-09-25: 0.5 mg via ORAL
  Filled 2015-09-23: qty 1

## 2015-09-23 MED ORDER — TIZANIDINE HCL 4 MG PO TABS
4.0000 mg | ORAL_TABLET | Freq: Three times a day (TID) | ORAL | Status: DC | PRN
  Administered 2015-09-25: 4 mg via ORAL
  Filled 2015-09-23: qty 1

## 2015-09-23 MED ORDER — IPRATROPIUM BROMIDE 0.06 % NA SOLN: 1.0000 | Freq: Three times a day (TID) | NASAL | Status: DC | PRN

## 2015-09-23 NOTE — Progress Notes (Signed)
Pharmacy Antibiotic Note  Katherine [name] is a 70 y.o. female admitted on 09/23/2015 with altered mental status.  Pharmacy has been consulted for renal dose adjusment of antibiotics. Currently on IV azithromycin and ceftriaxone for CAP.  First doses given in ED this afternoon.  Scr 0.98, estimated CrCl ~ 52 ml/min. Febrile to 101.6 F in the ED.  WBC elevated 15.4k   Plan: Continue Azithromcyn 500 mg IV q24h and Ceftriaxone 1gm IV q24h . Azithromycin and ceftriaxone do not require any adjustment if patient has renal insufficiency.    Height: 5' 2.5" (158.8 cm) Weight: 170 lb (77.1 kg) IBW/kg (Calculated) : 51.25  Temp (24hrs), Avg:99.7 F (37.6 C), Min:98.3 F (36.8 C), Max:101.6 F (38.7 C)   Recent Labs Lab 09/23/15 1432 09/23/15 1504  WBC 15.4*  --   CREATININE 0.98  --   LATICACIDVEN  --  2.74*    Estimated Creatinine Clearance: 51.9 mL/min (by C-G formula based on SCr of 0.98 mg/dL).    Allergies  Allergen Reactions  . Mellaril [Thioridazine] Itching and Other (See Comments)  . Penicillins     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 Has patient had a PCN reaction causing immediate rash, facial/tongue/throat swelling, SOB or lighthNoeadedness with hypotension: Yes Has patient had a PCN reaction causing severe rash involving mucus membranes or skin necrosis: Unknown Has patient had a PCN reaction that required hospitalization: Unknown Has patient had a PCN reaction occurring within the last 10 years: Unknown If all of the above answers are "NO", then may proceed with Cephalosporin use.   . Thioridazine Hcl Itching     Thank you for allowing pharmacy to be a part of this patient's care.  Arman FilterClark, Brayn Eckstein [place] 09/23/2015 7:52 PM

## 2015-09-23 NOTE — ED Provider Notes (Signed)
[facility]-EMERGENCY DEPT Provider Note   CSN: [id number] Arrival date & time: 09/23/15  1406     History   Chief Complaint Chief Complaint  Patient presents with  . Altered Mental Status    HPI Katherine [name] is a 70 y.o. female.  70 yo F with a chief complaint of altered mental status. Patient was found in her home after not talking to her friend for the past couple days. Unsure if she's been eating or drinking. Patient has been confused about the scenario does not know the siblings that she has. States that she's been coughing for the past few days to a week. Denies any difficulty breathing. Unsure if she's had fevers. Denies headache neck pain abdominal pain. Patient has had a couple episodes of vomiting. Denies diarrhea.   The history is provided by the patient and a friend. The history is limited by the condition of the patient.  Altered Mental Status   This is a new problem. The current episode started yesterday. The problem has not changed since onset.Associated symptoms include confusion. Risk factors include a recent illness. Her past medical history does not include seizures or head trauma.    Past Medical History:  Diagnosis Date  . ABDOMINAL PAIN, LOWER 02/04/2010  . ABNORMAL THYROID FUNCTION TESTS 08/04/2009  . ALLERGIC RHINITIS 09/05/2006  . ANXIETY 07/31/2007  . Blood transfusion   . Cataract    resolved -surgery done  . DEPRESSION 09/05/2006  . DIABETES MELLITUS, TYPE II 07/31/2007   no meds  . Diabetic foot ulcer (HCC) 10/26/2014  . DIVERTICULOSIS, COLON 09/05/2006  . Dysuria 04/15/2010  . Failure to thrive in childhood 04/06/2010  . FATIGUE 04/15/2010  . FIBROMYALGIA 05/10/2009  . FRACTURE, PELVIS, RIGHT 04/15/2010  . GASTROENTERITIS, ACUTE 12/25/2008  . GERD 09/05/2006  . Heart murmur   . HYPERLIPIDEMIA 09/05/2006  . HYPERTENSION 09/05/2006  . KNEE PAIN, LEFT 02/04/2010  . MRSA infection 10/26/2014  . OSTEOARTHRITIS 09/05/2006  . OSTEOPOROSIS 07/31/2007  . Other specified forms  of hearing loss 11/01/2009  . SINUSITIS- ACUTE-NOS 10/09/2007  . SKIN LESION 05/10/2009  . SPRAIN&STRAIN OTH SPEC SITES SHOULDER&UPPER ARM 11/01/2009  . Unspecified hearing loss 10/09/2007  . URI 02/20/2007  . UTI 02/20/2007    Patient Active Problem List   Diagnosis Date Noted  . CAP (community acquired pneumonia) 09/23/2015  . Sepsis (HCC) 09/23/2015  . Hypernatremia 09/23/2015  . Dehydration 09/23/2015  . MRSA infection 10/26/2014  . Diabetic foot ulcer (HCC) 10/26/2014  . Pre-ulcerative corn or callous 10/09/2013  . UTI (urinary tract infection) 05/09/2013  . Hypokalemia 05/09/2013  . Altered mental status 04/29/2013  . Normocytic anemia 01/06/2013  . 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 Thyroid nodule 11/07/2012  . Aortic stenosis, mild 05/27/2012  . Heart murmur, systolic 05/09/2012  . Encounter for preventative adult health care exam with abnormal findings 10/07/2010  . FATIGUE 04/15/2010  . Dysuria 04/15/2010  . FRACTURE, PELVIS, RIGHT 04/15/2010  . Failure to thrive in childhood 04/06/2010  . SPRAIN&STRAIN OTH SPEC SITES SHOULDER&UPPER ARM 11/01/2009  . ABNORMAL THYROID FUNCTION TESTS 08/04/2009  . SKIN LESION 05/10/2009  . Fibromyalgia 05/10/2009  . Anxiety state 07/31/2007  . Osteoporosis 07/31/2007  . Hyperlipidemia 09/05/2006  . DEPRESSION 09/05/2006  . Essential hypertension 09/05/2006  . Allergic rhinitis 09/05/2006  . GERD 09/05/2006  . DIVERTICULOSIS, COLON 09/05/2006  . OSTEOARTHRITIS 09/05/2006    Past Surgical History:  Procedure Laterality Date  . ABDOMINAL HYSTERECTOMY  12/08  Bladder tac, partial hysterectomy  . BREAST BIOPSY    . catarct removal     both eyes  . I&D KNEE WITH POLY EXCHANGE Left 01/03/2013   Procedure: IRRIGATION AND DEBRIDEMENT LEFT KNEE WITH POLY EXCHANGE;  Surgeon: Kathryne Hitch, MD;  Location: [facility];  Service: Orthopedics;  Laterality: Left;  . inguinal herniorrhapy     right Dr. Freida Busman 06/2009  .  TONSILLECTOMY    . TOTAL KNEE ARTHROPLASTY Left 12/20/2012   Procedure: LEFT TOTAL KNEE ARTHROPLASTY;  Surgeon: Kathryne Hitch, MD;  Location: [facility];  Service: Orthopedics;  Laterality: Left;    OB History    No data available       Home Medications    Prior to Admission medications   Medication Sig Start Date End Date Taking? Authorizing Provider  vitamin C (ASCORBIC ACID) 500 MG tablet Take 500 mg by mouth daily.   Yes Historical Provider, MD  albuterol (PROVENTIL HFA;VENTOLIN HFA) 108 (90 BASE) MCG/ACT inhaler Inhale 2 puffs into the lungs every 6 (six) hours as needed for wheezing or shortness of breath. 06/26/14   Corwin Levins, MD  alendronate (FOSAMAX) 70 MG tablet TAKE 1 TABLET BY MOUTH EVERY 7 DAYS. TAKE WITH A FULL GLASS OF WATER ON AN EMPTY STOMACH. 07/26/15   Corwin Levins, MD  ALPRAZolam Prudy Feeler) 0.5 MG tablet Take 0.5 mg by mouth 2 (two) times daily as needed for anxiety.    Historical Provider, MD  amLODipine (NORVASC) 10 MG tablet Take 0.5 tablets (5 mg total) by mouth every morning. 11/23/14   Corwin Levins, MD  antiseptic oral rinse (BIOTENE) LIQD 15 mLs by Mouth Rinse route daily as needed for dry mouth.    Historical Provider, MD  aspirin EC 325 MG EC tablet Take 1 tablet (325 mg total) by mouth 2 (two) times daily after a meal. 12/23/12   Kirtland Bouchard, PA-C  atorvastatin (LIPITOR) 40 MG tablet Take 1 tablet (40 mg total) by mouth every morning. 02/18/15   Corwin Levins, MD  azelastine (ASTELIN) 0.1 % nasal spray Place 1 spray into both nostrils 2 (two) times daily. Use in each nostril as directed 11/23/14   Corwin Levins, MD  benztropine (COGENTIN) 0.5 MG tablet Take 0.5 mg by mouth 2 (two) times daily.      Historical Provider, MD  budesonide (RHINOCORT AQUA) 32 MCG/ACT nasal spray Place 2 sprays into both nostrils daily. 07/14/14   Corwin Levins, MD  busPIRone (BUSPAR) 10 MG tablet Take 1 tablet (10 mg total) by mouth 3 (three) times daily. 12/11/13   Corwin Levins,  MD  calcium-vitamin D (OSCAL WITH D) 500-200 MG-UNIT tablet Take 1 tablet by mouth 3 (three) times daily. 02/25/15   Corwin Levins, MD  cetirizine (ZYRTEC) 10 MG tablet TAKE 1 TABLET BY MOUTH DAILY 06/23/14   Corwin Levins, MD  diclofenac sodium (VOLTAREN) 1 % GEL Apply 2 g topically daily as needed.     Historical Provider, MD  Diclofenac Sodium 1.5 % SOLN Apply 20-40 drops up to 4 times a day 04/09/14   Historical Provider, MD  etodolac (LODINE) 400 MG tablet Take 400 mg by mouth 2 (two) times daily as needed (for inflammation).     Historical Provider, MD  Flaxseed, Linseed, (FLAXSEED OIL) 1000 MG CAPS Take 1 capsule by mouth daily.    Historical Provider, MD  fluticasone (FLONASE) 50 MCG/ACT nasal spray USE 2 SPRAYS IN EACH NOSTRIL EVERY DAY  07/26/15   Corwin LevinsJames W John, MD  glucose blood [facility](ONETOUCH VERIO) test strip 1 each by Other route 2 (two) times daily. Use to check blood sugars twice a day Dx E11.9    Historical Provider, MD  glucose blood (ONETOUCH VERIO) test strip 1 each by Other route 2 (two) times daily. Use to check blood sugar twice a day Dx E11.9 10/22/14   Corwin LevinsJames W John, MD  hydrOXYzine (ATARAX/VISTARIL) 10 MG tablet Take 1 tablet (10 mg total) by mouth 2 (two) times daily as needed. Patient taking differently: Take 10 mg by mouth 2 (two) times daily as needed for anxiety.  12/11/13   Corwin LevinsJames W John, MD  ibuprofen (ADVIL,MOTRIN) 800 MG tablet Take 1 tablet (800 mg total) by mouth 3 (three) times daily. 01/10/15   Melton KrebsSamantha Nicole Riley, PA-C  ipratropium (ATROVENT) 0.06 % nasal spray USE 1 SPRAY IN [facility] NO[facility]IL THREE TIMES DAILY 07/26/15   Corwin LevinsJames W John, MD  Multiple Vitamin (MULTIVITAMIN WITH MINERALS) TABS tablet Take 1 tablet by mouth daily.    Historical Provider, MD  naproxen sodium (ANAPROX) 220 MG tablet Take 220-440 mg by mouth 2 (two) times daily as needed (for pain).    Historical Provider, MD  omega-3 acid ethyl esters (LOVAZA) 1 g capsule Take by mouth.    Historical Provider, MD    omeprazole (PRILOSEC) 20 MG capsule  09/24/14   Historical Provider, MD  ondansetron (ZOFRAN) 4 MG tablet TAKE 1 TABLET BY MOUTH EVERY 8 HOURS AS NEEDED FOR NAUSEA OR VOMITING 06/08/15   Corwin LevinsJames W John, MD  ONE [facility] LANC[facility] MISC Use as directed once daily to check blood sugar.  Diagnosis code 250.00 12/11/13   Corwin LevinsJames W John, MD  ONE [facility] Use to check blood sugars twice a day Dx E11.9 10/22/14   Corwin LevinsJames W John, MD  [facility] DELICA LANCETS 33G MISC CHECK BLOOD SUGAR ONCE DAILY AS DIRECTED 06/23/15   Corwin LevinsJames W John, MD  pantoprazole (PROTONIX) 40 MG tablet Take 1 tablet (40 mg total) by mouth daily. 11/17/14   Corwin LevinsJames W John, MD  potassium chloride SA (K-DUR,KLOR-CON) 20 MEQ tablet Take 1 tablet (20 mEq total) by mouth 2 (two) times daily. 09/16/14   Corwin LevinsJames W John, MD  pregabalin (LYRICA) 75 MG capsule Take 1 capsule (75 mg total) by mouth 2 (two) times daily. 01/09/13   Sharon SellerJessica K Eubanks, NP  promethazine (PHENERGAN) 12.5 MG tablet TAKE 1 TABLET BY MOUTH EVERY 8 HOURS AS NEEDED FOR NAUSEA AND VOMITING 10/01/13   Corwin LevinsJames W John, MD  risperiDONE (RISPERDAL) 3 MG tablet Take 3 mg by mouth at bedtime.     Historical Provider, MD  sodium chloride (OCEAN) 0.65 % SOLN nasal spray Place 1-2 sprays into both nostrils daily as needed for congestion.    Historical Provider, MD  tiZANidine (ZANAFLEX) 4 MG tablet Take 1 tablet (4 mg total) by mouth every 8 (eight) hours as needed for muscle spasms. 02/19/15   Corwin LevinsJames W John, MD  triamcinolone (NASACORT AQ) 55 MCG/ACT AERO nasal inhaler Place 2 sprays into the nose daily. 07/23/14   Corwin LevinsJames W John, MD    Family History Family History  Problem Relation Age of Onset  . Coronary artery disease Other   . Diabetes Other   . Colon cancer Neg Hx   . Esophageal cancer Neg Hx   . Stomach cancer Neg Hx   . Rectal cancer Neg Hx     Social History Social History  Substance Use  Topics  . Smoking status: Never Smoker  . Smokeless tobacco: Never Used  . Alcohol use No      Allergies   Mellaril [thioridazine]; Penicillins; and Thioridazine hcl   Review of Systems Review of Systems  Constitutional: Positive for activity change. Negative for chills and fever.  HENT: Negative for congestion and rhinorrhea.   Eyes: Negative for redness and visual disturbance.  Respiratory: Negative for shortness of breath and wheezing.   Cardiovascular: Negative for chest pain and palpitations.  Gastrointestinal: Negative for nausea and vomiting.  Genitourinary: Negative for dysuria and urgency.  Musculoskeletal: Negative for arthralgias and myalgias.  Skin: Negative for pallor and wound.  Neurological: Negative for dizziness and headaches.  Psychiatric/Behavioral: Positive for confusion.     Physical Exam Updated Vital Signs BP 129/76 (BP Location: Right Arm)   Pulse 77   Temp 98.3 F (36.8 C) (Oral)   Resp 19   Ht 5' 2.5" (1.588 m)   Wt 170 lb (77.1 kg)   SpO2 98%   BMI 30.60 kg/m   Physical Exam  Constitutional: She is oriented to person, place, and time. She appears well-developed and well-nourished. No distress.  HENT:  Head: Normocephalic and atraumatic.  Eyes: EOM are normal. Pupils are equal, round, and reactive to light.  Neck: Normal range of motion. Neck supple.  Cardiovascular: Normal rate and regular rhythm.  Exam reveals no gallop and no friction rub.   No murmur heard. Pulmonary/Chest: Effort normal. She has no wheezes. She has no rales.  Abdominal: Soft. She exhibits no distension. There is no tenderness.  Musculoskeletal: She exhibits no edema or tenderness.  Neurological: She is alert and oriented to person, place, and time. She has normal strength.  No noted meningeal signs.  Skin: Skin is warm and dry. She is not diaphoretic.  Psychiatric: She has a normal mood and affect. Her behavior is normal.  Nursing note and vitals reviewed.    ED Treatments / Results  Labs (all labs ordered are listed, but only abnormal results are  displayed) Labs Reviewed  COMPREHENSIVE METABOLIC PANEL - Abnormal; Notable for the following:       Result Value   Sodium 154 (*)    Chloride 120 (*)    CO2 20 (*)    Glucose, Bld 153 (*)    BUN 34 (*)    Total Protein 8.3 (*)    Albumin 5.2 (*)    AST 112 (*)    Total Bilirubin 1.7 (*)    GFR calc non Af Amer 57 (*)    All other components within normal limits  URINALYSIS, ROUTINE W REFLEX MICROSCOPIC (NOT [facility]) - Abnormal; Notable for the following:    Color, Urine AMBER (*)    APPearance HAZY (*)    Hgb urine dipstick MODERATE (*)    Bilirubin Urine SMALL (*)    Ketones, ur 15 (*)    Protein, ur 100 (*)    All other components within normal limits  CBC WITH DIFFERENTIAL/PLATELET - Abnormal; Notable for the following:    WBC 15.4 (*)    RBC 5.40 (*)    Hemoglobin 16.8 (*)    HCT 51.8 (*)    Neutro Abs 11.9 (*)    All other components within normal limits  AMMONIA - Abnormal; Notable for the following:    Ammonia 37 (*)    All other components within normal limits  URINE MICROSCOPIC-ADD ON - Abnormal; Notable for the following:    Squamous Epithelial /  LPF 0-5 (*)    Bacteria, UA FEW (*)    Casts HYALINE CASTS (*)    All other components within normal limits  I-STAT CG4 LACTIC ACID, ED - Abnormal; Notable for the following:    Lactic Acid, Venous 2.74 (*)    All other components within normal limits  CBG MONITORING, ED - Abnormal; Notable for the following:    Glucose-Capillary 140 (*)    All other components within normal limits  I-STAT VENOUS BLOOD GAS, ED - Abnormal; Notable for the following:    pH, Ven 7.468 (*)    pCO2, Ven 35.9 (*)    pO2, Ven 119.0 (*)    Bicarbonate 26.0 (*)    All other components within normal limits  CULTURE, BLOOD (ROUTINE X 2)  CULTURE, BLOOD (ROUTINE X 2)  URINE CULTURE  MRSA PCR SCREENING  LACTIC ACID, PLASMA  PROCALCITONIN  BLOOD GAS, VENOUS  HIV ANTIBODY (ROUTINE TESTING)  STREP PNEUMONIAE URINARY ANTIGEN  LACTIC ACID,  PLASMA  BASIC METABOLIC PANEL  CBC WITH DIFFERENTIAL/PLATELET  LEGIONELLA PNEUMOPHILA SEROGP 1 UR AG  I-STAT TROPOININ, ED  I-STAT CG4 LACTIC ACID, ED    EKG  EKG Interpretation  Date/Time:  Thursday September 23 2015 15:27:35 EDT Ventricular Rate:  103 PR Interval:    QRS Duration: 90 QT Interval:  340 QTC Calculation: 445 R Axis:   -46 Text Interpretation:  Sinus tachycardia Inferior infarct, old Probable anterior infarct, age indeterminate No significant change since last tracing Confirmed by Ryliee Figge MD, DANIEL [phone]) on 09/23/2015 3:52:53 PM       Radiology Dg Chest 2 View  Result Date: 09/23/2015 CLINICAL DATA:  Sepsis. EXAM: CHEST  2 VIEW COMPARISON:  April 29, 2013 FINDINGS: Mild atelectasis in the left lung base. No other acute abnormalities or changes. Anterior wedging of a lower thoracic vertebral body is stable since 2014. IMPRESSION: No active cardiopulmonary disease. Electronically Signed   By: Gerome Sam III M.D   On: 09/23/2015 14:50    Procedures Procedures (including critical care time)  Medications Ordered in ED Medications  fluticasone (FLONASE) 50 MCG/ACT nasal spray 2 spray (not administered)  ipratropium (ATROVENT) 0.06 % nasal spray 1 spray (not administered)  calcium-vitamin D (OSCAL WITH D) 500-200 MG-UNIT per tablet 1 tablet (not administered)  tiZANidine (ZANAFLEX) tablet 4 mg (not administered)  atorvastatin (LIPITOR) tablet 40 mg (not administered)  pantoprazole (PROTONIX) EC tablet 40 mg (not administered)  azelastine (ASTELIN) 0.1 % nasal spray 1 spray (1 spray Each Nare Given 09/23/15 2222)  amLODipine (NORVASC) tablet 5 mg (not administered)  ALPRAZolam (XANAX) tablet 0.5 mg (not administered)  albuterol (PROVENTIL) (2.5 MG/3ML) 0.083% nebulizer solution 3 mL (not administered)  loratadine (CLARITIN) tablet 10 mg (not administered)  busPIRone (BUSPAR) tablet 10 mg (10 mg Oral Given 09/23/15 2221)  pregabalin (LYRICA) capsule 75 mg (75 mg  Oral Given 09/23/15 2221)  benztropine (COGENTIN) tablet 0.5 mg (0.5 mg Oral Given 09/23/15 2221)  risperiDONE (RISPERDAL) tablet 3 mg (3 mg Oral Given 09/23/15 2230)  enoxaparin (LOVENOX) injection 40 mg (40 mg Subcutaneous Given 09/23/15 2220)  dextrose 5 %-0.45 % sodium chloride infusion ( Intravenous New Bag/Given 09/23/15 2149)  cefTRIAXone (ROCEPHIN) 1 g in dextrose 5 % 50 mL IVPB (not administered)  azithromycin (ZITHROMAX) 500 mg in dextrose 5 % 250 mL IVPB (not administered)  ondansetron (ZOFRAN) injection 4 mg (not administered)  sodium chloride 0.9 % bolus 1,000 mL (0 mLs Intravenous Stopped 09/23/15 1646)  acetaminophen (TYLENOL) tablet 1,000 mg (  1,000 mg Oral Given 09/23/15 1645)  cefTRIAXone (ROCEPHIN) 1 g in dextrose 5 % 50 mL IVPB (0 g Intravenous Stopped 09/23/15 1713)  azithromycin (ZITHROMAX) 500 mg in dextrose 5 % 250 mL IVPB (0 mg Intravenous Stopped 09/23/15 1822)     Initial Impression / Assessment and Plan / ED Course  I have reviewed the triage vital signs and the nursing notes.  Pertinent labs & imaging results that were available during my care of the patient were reviewed by me and considered in my medical decision making (see chart for details).  Clinical Course    70 yo F With a chief complaint of altered mental status. Patient is pleasantly confused on my exam. Chest x-ray is unremarkable, UA is negative for infection. Patient appears to be significantly hemoconcentrated on lab evaluation. Patient is clinically dehydrated. Febrile. This patient recently had cough and congestion seen in her PCPs office chest x-ray with a possible left lower lobe pneumonia start on Rocephin and azithromycin. Admit for altered mental status.  The patients results and plan were reviewed and discussed.   Any x-rays performed were independently reviewed by myself.   Differential diagnosis were considered with the presenting HPI.  Medications  fluticasone (FLONASE) 50 MCG/ACT nasal spray  2 spray (not administered)  ipratropium (ATROVENT) 0.06 % nasal spray 1 spray (not administered)  calcium-vitamin D (OSCAL WITH D) 500-200 MG-UNIT per tablet 1 tablet (not administered)  tiZANidine (ZANAFLEX) tablet 4 mg (not administered)  atorvastatin (LIPITOR) tablet 40 mg (not administered)  pantoprazole (PROTONIX) EC tablet 40 mg (not administered)  azelastine (ASTELIN) 0.1 % nasal spray 1 spray (1 spray Each Nare Given 09/23/15 2222)  amLODipine (NORVASC) tablet 5 mg (not administered)  ALPRAZolam (XANAX) tablet 0.5 mg (not administered)  albuterol (PROVENTIL) (2.5 MG/3ML) 0.083% nebulizer solution 3 mL (not administered)  loratadine (CLARITIN) tablet 10 mg (not administered)  busPIRone (BUSPAR) tablet 10 mg (10 mg Oral Given 09/23/15 2221)  pregabalin (LYRICA) capsule 75 mg (75 mg Oral Given 09/23/15 2221)  benztropine (COGENTIN) tablet 0.5 mg (0.5 mg Oral Given 09/23/15 2221)  risperiDONE (RISPERDAL) tablet 3 mg (3 mg Oral Given 09/23/15 2230)  enoxaparin (LOVENOX) injection 40 mg (40 mg Subcutaneous Given 09/23/15 2220)  dextrose 5 %-0.45 % sodium chloride infusion ( Intravenous New Bag/Given 09/23/15 2149)  cefTRIAXone (ROCEPHIN) 1 g in dextrose 5 % 50 mL IVPB (not administered)  azithromycin (ZITHROMAX) 500 mg in dextrose 5 % 250 mL IVPB (not administered)  ondansetron (ZOFRAN) injection 4 mg (not administered)  sodium chloride 0.9 % bolus 1,000 mL (0 mLs Intravenous Stopped 09/23/15 1646)  acetaminophen (TYLENOL) tablet 1,000 mg (1,000 mg Oral Given 09/23/15 1645)  cefTRIAXone (ROCEPHIN) 1 g in dextrose 5 % 50 mL IVPB (0 g Intravenous Stopped 09/23/15 1713)  azithromycin (ZITHROMAX) 500 mg in dextrose 5 % 250 mL IVPB (0 mg Intravenous Stopped 09/23/15 1822)    Vitals:   09/23/15 1815 09/23/15 1822 09/23/15 1830 09/23/15 1947  BP: 126/93  142/90 129/76  Pulse: 73  79 77  Resp: 19  23 19   Temp:  98.7 F (37.1 C)  98.3 F (36.8 C)  TempSrc:  Oral  Oral  SpO2: 97%  97% 98%  Weight:       Height:        Final diagnoses:  Altered mental status, unspecified altered mental status type  UTI (lower urinary tract infection)    Admission/ observation were discussed with the admitting physician, patient and/or family and they are comfortable  with the plan.    Final Clinical Impressions(s) / ED Diagnoses   Final diagnoses:  Altered mental status, unspecified altered mental status type  UTI (lower urinary tract infection)    New Prescriptions Current Discharge Medication List       Melene Plan, DO 09/23/15 2348

## 2015-09-23 NOTE — ED Notes (Signed)
Pharmacy at bedside discussing medications that caregiver has just brought to bedside.

## 2015-09-23 NOTE — ED Triage Notes (Signed)
Pt brought here by friend who went to check on her today and the pt, who is normally ao x 4, was unable to let her in the house.  She found her friend rocking back and fourth.  Pt warm to touch and tachy.

## 2015-09-23 NOTE — ED Notes (Signed)
Pt's CBG result was 140. Informed Steward DroneBrenda - RN.

## 2015-09-23 NOTE — H&P (Signed)
History and Physical    Katherine [name] ZOX:[id number] DOB: Mar 01, 1945 DOA: 09/23/2015  Referring MD/NP/PA: Melene Plan, DO - EDP PCP: Oliver Barre, MD  Outpatient Specialists: None per pt Patient coming from: Home where she lives alone but requires an aide to perform fundamental ADLs  Chief Complaint: AMS  HPI: Katherine [name] is a 70 y.o. female with medical history of HTN, GERD, fibromyalgia, and allergic rhinitis brought by a friend for evaluation of altered mental status. Her friend found her in her home disoriented, warm and tachycardic. She endorses a cough productive of thick sputum and fevers worsening over the past several days. She thought it was allergies, so she took medications prescribed for that, though she is unable to tell me what they were. She is unable to tell me if she's been vomiting, though this was documented in ED. She denies trouble breathing.    ED Course: She was noted to be tachycardic, febrile to 101.27F, with a leukocytosis of 15.4 and lactate elevation to 2.74. Troponin negative, ECG abnormal but stable from prior. CXR showed left lower atelectasis vs. early pneumonia. She was given IV abx for CAP, 1L NS bolus, and TRH were called for admission.   Review of Systems: As per HPI otherwise 10 point review of systems negative.  +fever, chills, chronic pain all over attributed to fibromyalgia, unchanged.  Denies weight loss, changes in vision or hearing, headache, sore throat, chest pain, palpitations, shortness of breath, abdominal pain, nausea, vomiting, changes in bowel habits, blood in stool, change in bladder habits, myalgias, arthralgias, and rash.   Past Medical History:  Diagnosis Date  . ABDOMINAL PAIN, LOWER 02/04/2010  . ABNORMAL THYROID FUNCTION TESTS 08/04/2009  . ALLERGIC RHINITIS 09/05/2006  . ANXIETY 07/31/2007  . Blood transfusion   . Cataract    resolved -surgery done  . DEPRESSION 09/05/2006  . DIABETES MELLITUS, TYPE II 07/31/2007   no meds  . Diabetic  foot ulcer (HCC) 10/26/2014  . DIVERTICULOSIS, COLON 09/05/2006  . Dysuria 04/15/2010  . Failure to thrive in childhood 04/06/2010  . FATIGUE 04/15/2010  . FIBROMYALGIA 05/10/2009  . FRACTURE, PELVIS, RIGHT 04/15/2010  . GASTROENTERITIS, ACUTE 12/25/2008  . GERD 09/05/2006  . Heart murmur   . HYPERLIPIDEMIA 09/05/2006  . HYPERTENSION 09/05/2006  . KNEE PAIN, LEFT 02/04/2010  . MRSA infection 10/26/2014  . OSTEOARTHRITIS 09/05/2006  . OSTEOPOROSIS 07/31/2007  . Other specified forms of hearing loss 11/01/2009  . SINUSITIS- ACUTE-NOS 10/09/2007  . SKIN LESION 05/10/2009  . SPRAIN&STRAIN OTH SPEC SITES SHOULDER&UPPER ARM 11/01/2009  . Unspecified hearing loss 10/09/2007  . URI 02/20/2007  . UTI 02/20/2007    Past Surgical History:  Procedure Laterality Date  . ABDOMINAL HYSTERECTOMY  12/08   Bladder tac, partial hysterectomy  . BREAST BIOPSY    . catarct removal     both eyes  . I&D KNEE WITH POLY EXCHANGE Left 01/03/2013   Procedure: IRRIGATION AND DEBRIDEMENT LEFT KNEE WITH POLY EXCHANGE;  Surgeon: Kathryne Hitch, MD;  Location: [facility];  Service: Orthopedics;  Laterality: Left;  . inguinal herniorrhapy     right Dr. Freida Busman 06/2009  . TONSILLECTOMY    . TOTAL KNEE ARTHROPLASTY Left 12/20/2012   Procedure: LEFT TOTAL KNEE ARTHROPLASTY;  Surgeon: Kathryne Hitch, MD;  Location: [facility];  Service: Orthopedics;  Laterality: Left;     reports that she has never smoked. She has never used smokeless tobacco. She reports that she does not drink alcohol or use drugs.  Allergies  Allergen Reactions  . Penicillins     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 Has patient had a PCN reaction causing immediate rash, facial/tongue/throat swelling, SOB or lighthNoeadedness with hypotension: Yes Has patient had a PCN reaction causing severe rash involving mucus membranes or skin necrosis: Unknown Has patient had a PCN reaction that required hospitalization: Unknown Has patient had a PCN reaction occurring  within the last 10 years: Unknown If all of the above answers are "NO", then may proceed with Cephalosporin use.   . Thioridazine Hcl Itching    Family History  Problem Relation Age of Onset  . Coronary artery disease Other   . Diabetes Other   . Colon cancer Neg Hx   . Esophageal cancer Neg Hx   . Stomach cancer Neg Hx   . Rectal cancer Neg Hx    Unacceptable: Noncontributory, unremarkable, or negative. Acceptable: Family history reviewed and not pertinent (If you reviewed it)  Prior to Admission medications   Medication Sig Start Date End Date Taking? Authorizing Provider  albuterol (PROVENTIL HFA;VENTOLIN HFA) 108 (90 BASE) MCG/ACT inhaler Inhale 2 puffs into the lungs every 6 (six) hours as needed for wheezing or shortness of breath. 06/26/14   Corwin Levins, MD  alendronate (FOSAMAX) 70 MG tablet TAKE 1 TABLET BY MOUTH EVERY 7 DAYS. TAKE WITH A FULL GLASS OF WATER ON AN EMPTY STOMACH. 07/26/15   Corwin Levins, MD  ALPRAZolam Prudy Feeler) 0.5 MG tablet Take 0.5 mg by mouth 2 (two) times daily as needed for anxiety.    Historical Provider, MD  amLODipine (NORVASC) 10 MG tablet Take 0.5 tablets (5 mg total) by mouth every morning. 11/23/14   Corwin Levins, MD  aspirin EC 325 MG EC tablet Take 1 tablet (325 mg total) by mouth 2 (two) times daily after a meal. 12/23/12   Kirtland Bouchard, PA-C  atorvastatin (LIPITOR) 40 MG tablet Take 1 tablet (40 mg total) by mouth every morning. 02/18/15   Corwin Levins, MD  azelastine (ASTELIN) 0.1 % nasal spray Place 1 spray into both nostrils 2 (two) times daily. Use in each nostril as directed 11/23/14   Corwin Levins, MD  benztropine (COGENTIN) 0.5 MG tablet Take 0.5 mg by mouth 2 (two) times daily.      Historical Provider, MD  budesonide (RHINOCORT AQUA) 32 MCG/ACT nasal spray Place 2 sprays into both nostrils daily. 07/14/14   Corwin Levins, MD  busPIRone (BUSPAR) 10 MG tablet Take 1 tablet (10 mg total) by mouth 3 (three) times daily. 12/11/13   Corwin Levins,  MD  calcium-vitamin D (OSCAL WITH D) 500-200 MG-UNIT tablet Take 1 tablet by mouth 3 (three) times daily. 02/25/15   Corwin Levins, MD  cetirizine (ZYRTEC) 10 MG tablet TAKE 1 TABLET BY MOUTH DAILY 06/23/14   Corwin Levins, MD  diclofenac sodium (VOLTAREN) 1 % GEL Apply topically 4 (four) times daily.    Historical Provider, MD  Diclofenac Sodium 1.5 % SOLN  04/09/14   Historical Provider, MD  etodolac (LODINE) 400 MG tablet Take 400 mg by mouth 2 (two) times daily.    Historical Provider, MD  fluticasone [facility]) 50 MCG/ACT nasal spray USE 2 SPRAYS IN [facility] NOSTRIL EVERY DAY 07/26/15   Corwin Levins, MD  glucose blood [facility] VERIO) test strip 1 each by Other route 2 (two) times daily. Use to check blood sugars twice a day Dx E11.9    Historical Provider, MD  glucose blood (  ONETOUCH VERIO) test strip 1 each by Other route 2 (two) times daily. Use to check blood sugar twice a day Dx E11.9 10/22/14   Corwin LevinsJames W John, MD  hydrOXYzine (ATARAX/VISTARIL) 10 MG tablet Take 1 tablet (10 mg total) by mouth 2 (two) times daily as needed. 12/11/13   Corwin LevinsJames W John, MD  ibuprofen (ADVIL,MOTRIN) 800 MG tablet Take 1 tablet (800 mg total) by mouth 3 (three) times daily. 01/10/15   Melton KrebsSamantha Nicole Riley, PA-C  ipratropium (ATROVENT) 0.06 % nasal spray USE [facility] THREE TIM[facility] DAILY 07/26/15   Corwin LevinsJames W John, MD  omeprazole (PRILOSEC) 20 MG capsule  09/24/14   Historical Provider, MD  ondansetron (ZOFRAN) 4 MG tablet TAKE 1 TABLET BY MOUTH EVERY 8 HOURS AS NEEDED FOR NAUSEA OR VOMITING 06/08/15   Corwin LevinsJames W John, MD  ONE [facility] LANCETS MISC Use as directed once daily to check blood sugar.  Diagnosis code 250.00 12/11/13   Corwin LevinsJames W John, MD  ONE [facility] LANC[facility] MISC Use to check blood sugars twice a day Dx E11.9 10/22/14   Corwin LevinsJames W John, MD  [facility] DELICA LANCETS 33G MISC CHECK BLOOD SUGAR ONCE DAILY AS DIRECTED 06/23/15   Corwin LevinsJames W John, MD  pantoprazole (PROTONIX) 40 MG tablet Take 1 tablet (40 mg total) by mouth daily.  11/17/14   Corwin LevinsJames W John, MD  potassium chloride SA (K-DUR,KLOR-CON) 20 MEQ tablet Take 1 tablet (20 mEq total) by mouth 2 (two) times daily. 09/16/14   Corwin LevinsJames W John, MD  pregabalin (LYRICA) 75 MG capsule Take 1 capsule (75 mg total) by mouth 2 (two) times daily. 01/09/13   Sharon SellerJessica K Eubanks, NP  promethazine (PHENERGAN) 12.5 MG tablet TAKE 1 TABLET BY MOUTH EVERY 8 HOURS AS NEEDED FOR NAUSEA AND VOMITING 10/01/13   Corwin LevinsJames W John, MD  risperiDONE (RISPERDAL) 3 MG tablet Take 3 mg by mouth at bedtime.     Historical Provider, MD  tiZANidine (ZANAFLEX) 4 MG tablet Take 1 tablet (4 mg total) by mouth every 8 (eight) hours as needed for muscle spasms. 02/19/15   Corwin LevinsJames W John, MD  triamcinolone (NASACORT AQ) 55 MCG/ACT AERO nasal inhaler Place 2 sprays into the nose daily. 07/23/14   Corwin LevinsJames W John, MD   Physical Exam: Vitals:   09/23/15 1800 09/23/15 1815 09/23/15 1822 09/23/15 1830  BP: 120/84 126/93  142/90  Pulse: 72 73  79  Resp: 16 19  23   Temp:   98.7 F (37.1 C)   TempSrc:   Oral   SpO2: 97% 97%  97%  Weight:      Height:       Constitutional: NAD, calm, comfortable Eyes: PERRL, lids and conjunctivae normal ENMT: Mucous membranes are moist. Posterior pharynx clear of any exudate or lesions.Normal dentition.  Neck: normal, supple, no masses, no thyromegaly Respiratory: Good air movement with left basilar crackles, no wheezing. Normal respiratory effort, saturating well on room air. No accessory muscle use.  Cardiovascular: Regular rate and rhythm, no murmurs / rubs / gallops. No extremity edema. 2+ pedal pulses. No carotid bruits.  Abdomen: no tenderness, no masses palpated. No hepatosplenomegaly. Bowel sounds positive.  Musculoskeletal: no clubbing / cyanosis. No joint deformity upper and lower extremities. Good ROM, no contractures. Normal muscle tone.  Skin: no rashes, lesions, ulcers. No induration Neurologic: CN 2-12 grossly intact. Sensation intact, DTR normal. Strength 5/5 in all 4.    Psychiatric: Normal judgment and insight. Alert and oriented x 3. Normal mood.  Labs on Admission: I have personally reviewed following labs and imaging studies  CBC:  Recent Labs Lab 09/23/15 1432  WBC 15.4*  NEUTROABS 11.9*  HGB 16.8*  HCT 51.8*  MCV 95.9  PLT 336   Basic Metabolic Panel:  Recent Labs Lab 09/23/15 1432  NA 154*  K 3.8  CL 120*  CO2 20*  GLUCOSE 153*  BUN 34*  CREATININE 0.98  CALCIUM 9.3   GFR: Estimated Creatinine Clearance: 51.9 mL/min (by C-G formula based on SCr of 0.98 mg/dL). Liver Function Tests:  Recent Labs Lab 09/23/15 1432  AST 112*  ALT 52  ALKPHOS 51  BILITOT 1.7*  PROT 8.3*  ALBUMIN 5.2*   No results for input(s): LIPASE, AMYLASE in the last 168 hours.  Recent Labs Lab 09/23/15 1614  AMMONIA 37*   Coagulation Profile: No results for input(s): INR, PROTIME in the last 168 hours. Cardiac Enzymes: No results for input(s): CKTOTAL, CKMB, CKMBINDEX, TROPONINI in the last 168 hours. BNP (last 3 results) No results for input(s): PROBNP in the last 8760 hours. HbA1C: No results for input(s): HGBA1C in the last 72 hours. CBG:  Recent Labs Lab 09/23/15 1419  GLUCAP 140*   Lipid Profile: No results for input(s): CHOL, HDL, LDLCALC, TRIG, CHOLHDL, LDLDIRECT in the last 72 hours. Thyroid Function Tests: No results for input(s): TSH, T4TOTAL, FREET4, T3FREE, THYROIDAB in the last 72 hours. Anemia Panel: No results for input(s): VITAMINB12, FOLATE, FERRITIN, TIBC, IRON, RETICCTPCT in the last 72 hours. Urine analysis:    Component Value Date/Time   COLORURINE AMBER (A) 09/23/2015 1544   APPEARANCEUR HAZY (A) 09/23/2015 1544   LABSPEC 1.027 09/23/2015 1544   PHURINE 6.0 09/23/2015 1544   GLUCOSEU NEGATIVE 09/23/2015 1544   GLUCOSEU NEGATIVE 06/15/2015 1349   HGBUR MODERATE (A) 09/23/2015 1544   BILIRUBINUR SMALL (A) 09/23/2015 1544   KETONESUR 15 (A) 09/23/2015 1544   PROTEINUR 100 (A) 09/23/2015 1544    UROBILINOGEN 0.2 06/15/2015 1349   NITRITE NEGATIVE 09/23/2015 1544   LEUKOCYTESUR NEGATIVE 09/23/2015 1544   Sepsis Labs: @LABRCNTIP (procalcitonin:4,lacticidven:4) )No results found for this or any previous visit (from the past 240 hour(s)).   Radiological Exams on Admission: Dg Chest 2 View  Result Date: 09/23/2015 CLINICAL DATA:  Sepsis. EXAM: CHEST  2 VIEW COMPARISON:  April 29, 2013 FINDINGS: Mild atelectasis in the left lung base. No other acute abnormalities or changes. Anterior wedging of a lower thoracic vertebral body is stable since 2014. IMPRESSION: No active cardiopulmonary disease. Electronically Signed   By: Gerome Sam III M.D   On: 09/23/2015 14:50    EKG: Independently reviewed. Mild sinus tachycardia with leftward axis and absent R wave progression. Also appears of ST segment abnormalities in I, aVL, and aVR which are unchanged from previous ECGs.   Assessment/Plan Principal Problem:   CAP (community acquired pneumonia) Active Problems:   Anxiety state   Essential hypertension   Allergic rhinitis   GERD   Fibromyalgia   Altered mental status   Sepsis (HCC)   Hypernatremia   Dehydration  Community-acquired pneumonia: With leukocytosis, fever, and lactate elevation, will treat as sepsis without hypotension/indication for CCM. No RF's for HCAP. CXR: with atelectasis possibly developing PNA.  - Admit to telemetry with sepsis protocol. No hypoxemia, but will monitor respiratory status.  - Monitor leukocyte count - Urine pneumococcus, legionella antigens - Blood culture (8/24 >> ) - HIV antibody - Azithromycin (8/24 > 8/28) - Ceftriaxone (8/24 > 8/30), has documented PCN allergy "makes tremors  worse from fibromyalgia," without signs of reaction 2 hours after first dose of rocephin. Will monitor for allergic reaction.  - Supplemental oxygen as needed for hypoxemia; goal SpO2: >90% - Incentive spirometry   Impaired mobility: Requires walker at home and appears  diffusely weak.  - PT/OT  Hypernatremia: Unknown chronicity, presumed more acute due to dehydration, as evidenced by exam, history, and hemoconcentrated labs.  - s/p 1L NS bolus, start D5 1/2NS @ 100cc/hr and encourage po.  - Recheck chemistries in AM.   Allergic rhinitis: Stable, chronic.  - Continue home medications: formulary po antihistamine, atrovent and flonase IN.   Fibromyalgia:  - Continue home medications: tizanidine 4mg  q8h, lyrica, motrin prn  GERD: Chronic, stable.  - continue formulary PPI  HTN: At goal as an outpatient without significant hypotension here. Of note, bicuspid, severely calcified aortic valve with only mild hemodynamic changes on echo 2016. - Continue home medication: norvasc 10mg  starting 8/25  Anxiety/mood disorder:  - Continue home medications: risperdal qHS, cogesntin 0.5mg  BID, buspar 10mg  TID, xanax 0.5mg  prn anxiety  Polypharmacy: There are several medication class duplications (e.g. IN steroids, antiemetics, PPI) listed on pt's med list. Med reconciliation will be important at discharge. Her family shares this concern.  DVT prophylaxis: Lovenox Code Status: Full, confirmed with pt Family Communication: Discussed with sister, Charlean Sanfilippo, this evening, over the phone.  Disposition Plan: Likely discharge to home with home health vs. SNF in the next 24 - 48 hours. Consults called: None Admission status: Observation on med-surg   Hazeline Junker MD [facility] Pager [phone]  If 7PM-7AM, please contact night-coverage [email or web address] Password [email or web address]  09/23/2015, 6:49 PM

## 2015-09-24 DIAGNOSIS — J189 Pneumonia, unspecified organism: Secondary | ICD-10-CM | POA: Diagnosis not present

## 2015-09-24 DIAGNOSIS — A419 Sepsis, unspecified organism: Secondary | ICD-10-CM | POA: Diagnosis not present

## 2015-09-24 LAB — CBC WITH DIFFERENTIAL/PLATELET
Basophils Absolute: 0 10*3/uL (ref 0.0–0.1)
Basophils Relative: 0 %
Eosinophils Absolute: 0 10*3/uL (ref 0.0–0.7)
Eosinophils Relative: 0 %
HEMATOCRIT: 42.9 % (ref 36.0–46.0)
Hemoglobin: 13.3 g/dL (ref 12.0–15.0)
LYMPHS ABS: 3.2 10*3/uL (ref 0.7–4.0)
LYMPHS PCT: 24 %
MCH: 30.3 pg (ref 26.0–34.0)
MCHC: 31 g/dL (ref 30.0–36.0)
MCV: 97.7 fL (ref 78.0–100.0)
MONO ABS: 1 10*3/uL (ref 0.1–1.0)
MONOS PCT: 8 %
NEUTROS ABS: 8.8 10*3/uL — AB (ref 1.7–7.7)
Neutrophils Relative %: 67 %
Platelets: 286 10*3/uL (ref 150–400)
RBC: 4.39 MIL/uL (ref 3.87–5.11)
RDW: 14.7 % (ref 11.5–15.5)
WBC: 13 10*3/uL — AB (ref 4.0–10.5)

## 2015-09-24 LAB — BASIC METABOLIC PANEL
ANION GAP: 10 (ref 5–15)
BUN: 30 mg/dL — AB (ref 6–20)
CHLORIDE: 116 mmol/L — AB (ref 101–111)
CO2: 24 mmol/L (ref 22–32)
Calcium: 8.1 mg/dL — ABNORMAL LOW (ref 8.9–10.3)
Creatinine, Ser: 0.8 mg/dL (ref 0.44–1.00)
GFR calc Af Amer: >60 mL/min (ref >60)
GFR calc non Af Amer: >60 mL/min (ref >60)
Glucose, Bld: 152 mg/dL — ABNORMAL HIGH (ref 65–99)
POTASSIUM: 2.8 mmol/L — AB (ref 3.5–5.1)
SODIUM: 150 mmol/L — AB (ref 135–145)

## 2015-09-24 LAB — LACTIC ACID, PLASMA
Lactic Acid, Venous: 3 mmol/L (ref 0.5–1.9)
Lactic Acid, Venous: 2.3 mmol/L (ref 0.5–1.9)
Lactic Acid, Venous: 2.6 mmol/L (ref 0.5–1.9)

## 2015-09-24 LAB — MAGNESIUM: MAGNESIUM: 1.9 mg/dL (ref 1.7–2.4)

## 2015-09-24 LAB — URINE CULTURE: CULTURE: NO GROWTH

## 2015-09-24 LAB — HIV ANTIBODY (ROUTINE TESTING W REFLEX): HIV Screen 4th Generation wRfx: NONREACTIVE

## 2015-09-24 LAB — STREP PNEUMONIAE URINARY ANTIGEN: Strep Pneumo Urinary Antigen: NEGATIVE

## 2015-09-24 LAB — MRSA PCR SCREENING: MRSA by PCR: NEGATIVE

## 2015-09-24 MED ORDER — SODIUM CHLORIDE 0.9 % IV SOLN
INTRAVENOUS | Status: DC
  Administered 2015-09-24 – 2015-09-25 (×3): via INTRAVENOUS

## 2015-09-24 MED ORDER — POTASSIUM CHLORIDE CRYS ER 20 MEQ PO TBCR
30.0000 meq | EXTENDED_RELEASE_TABLET | ORAL | Status: AC
  Administered 2015-09-24 (×2): 30 meq via ORAL
  Filled 2015-09-24 (×2): qty 1

## 2015-09-24 MED ORDER — SODIUM CHLORIDE 0.45 % IV BOLUS
500.0000 mL | Freq: Once | INTRAVENOUS | Status: AC
  Administered 2015-09-24: 500 mL via INTRAVENOUS

## 2015-09-24 MED ORDER — SODIUM CHLORIDE 0.9 % IV BOLUS (SEPSIS)
1000.0000 mL | Freq: Once | INTRAVENOUS | Status: AC
  Administered 2015-09-24: 1000 mL via INTRAVENOUS

## 2015-09-24 NOTE — Progress Notes (Signed)
PROGRESS NOTE    Katherine [name]  RUE:[id number] DOB: Sep 20, 1945 DOA: 09/23/2015 PCP: Oliver Barre, MD  Outpatient Specialists: None  Brief Narrative: 70 y.o. female with medical history of HTN, GERD, fibromyalgia, and allergic rhinitis presenting with AMS, cough, and fever. She was noted to be tachycardic, febrile to 101.44F, with a leukocytosis of 15.4 and lactate elevation to 2.74, sodium 154. No hypoxemia. Troponin negative, ECG abnormal but stable from prior. CXR showed left lower atelectasis vs. early pneumonia. She was given IV abx for CAP, 1L NS bolus, and TRH were called for admission. She has remained stable overnight, but lactate continues to be elevated.   Assessment & Plan:   Principal Problem:   CAP (community acquired pneumonia) Active Problems:   Anxiety state   Essential hypertension   Allergic rhinitis   GERD   Fibromyalgia   Altered mental status   Sepsis (HCC)   Hypernatremia   Dehydration  Community-acquired pneumonia: With leukocytosis, fever, and lactate elevation, will treat as sepsis without hypotension/indication for CCM. No RF's for HCAP. CXR with atelectasis possibly developing PNA.  - Most parameters improving: leukocytosis decreasing, AKI resolving, hypernatremia improving at acceptable rate.  - Lactate essentially stable, 500cc bolus given overnight, will give additional 1L NS bolus. Pt appears stable with stable, normal vital signs.  - Urine pneumococcus Ag neg - Blood culture (8/24 >> ) NGTD - Azithromycin (8/24 > 8/28) - Ceftriaxone (8/24 > 8/30), tolerating this   Impaired mobility: Requires walker at home and appears diffusely weak.  - PT/OT  Hypernatremia: Unknown chronicity, presumed more acute due to dehydration, improving with IVF's - Continue measured correction of free water deficit. Rate of correction ok.  - Recheck chemistries in AM.   Allergic rhinitis: Stable, chronic.  - Continue home medications: formulary po antihistamine,  atrovent and flonase IN.   Fibromyalgia:  - Continue home medications: tizanidine 4mg  q8h, lyrica, motrin prn  GERD: Chronic, stable.  - continue formulary PPI  HTN: At goal as an outpatient without significant hypotension here. Of note, bicuspid, severely calcified aortic valve with only mild hemodynamic changes on echo 2016. - Continue home medication: norvasc 10mg  starting 8/25  Anxiety/mood disorder:  - Continue home medications: risperdal qHS, cogesntin 0.5mg  BID, buspar 10mg  TID, xanax 0.5mg  prn anxiety  Polypharmacy: There are several medication class duplications (e.g. IN steroids, antiemetics, PPI) listed on pt's med list. Med reconciliation will be important at discharge. Her family shares this concern.  DVT prophylaxis: Lovenox Code Status: Full Family Communication: None at bedside, will attempt to call sister later today with updates Disposition Plan: Anticipate D/C home vs. SNF in next 48 hours.   Consultants:   None  Procedures:  None  Antimicrobials:  - Azithromycin (8/24 > 8/28) - Ceftriaxone (8/24 > 8/30)  Subjective: Reports improved breathing, no chest pain or fevers since admission.   Objective: Vitals:   09/23/15 1830 09/23/15 1947 09/24/15 0138 09/24/15 0532  BP: 142/90 129/76 125/65 (!) 155/82  Pulse: 79 77 67 63  Resp: 23 19  16   Temp:  98.3 F (36.8 C)  97.8 F (36.6 C)  TempSrc:  Oral  Oral  SpO2: 97% 98%  98%  Weight:      Height:        Intake/Output Summary (Last 24 hours) at 09/24/15 1211 Last data filed at 09/24/15 0946  Gross per 24 hour  Intake           898.33 ml  Output  400 ml  Net           498.33 ml   Filed Weights   09/23/15 1414  Weight: 77.1 kg (170 lb)    Examination: General exam: Appears calm and comfortable  Respiratory system: Clear to auscultation. Respiratory effort normal. Cardiovascular system: S1 & S2 heard, RRR. No JVD, murmurs, rubs, gallops or clicks. No JVD.  Gastrointestinal  system: Abdomen is nondistended, soft and nontender. No organomegaly or masses felt. Normal bowel sounds heard. Central nervous system: Alert and oriented. No focal neurological deficits. Extremities: TMT deformities bilaterally, otherwise normal without edema Skin: No rashes, lesions or ulcers Psychiatry: Judgement and insight appear normal. Mood euthymic, displays lighthearted disposition with appropriate behavior.    Data Reviewed: I have personally reviewed following labs and imaging studies  CBC:  Recent Labs Lab 09/23/15 1432 09/24/15 0443  WBC 15.4* 13.0*  NEUTROABS 11.9* 8.8*  HGB 16.8* 13.3  HCT 51.8* 42.9  MCV 95.9 97.7  PLT 336 286   Basic Metabolic Panel:  Recent Labs Lab 09/23/15 1432 09/24/15 0443 09/24/15 1104  NA 154* 150*  --   K 3.8 2.8*  --   CL 120* 116*  --   CO2 20* 24  --   GLUCOSE 153* 152*  --   BUN 34* 30*  --   CREATININE 0.98 0.80  --   CALCIUM 9.3 8.1*  --   MG  --   --  1.9   GFR: Estimated Creatinine Clearance: 63.6 mL/min (by C-G formula based on SCr of 0.8 mg/dL). Liver Function Tests:  Recent Labs Lab 09/23/15 1432  AST 112*  ALT 52  ALKPHOS 51  BILITOT 1.7*  PROT 8.3*  ALBUMIN 5.2*   No results for input(s): LIPASE, AMYLASE in the last 168 hours.  Recent Labs Lab 09/23/15 1614  AMMONIA 37*   Coagulation Profile: No results for input(s): INR, PROTIME in the last 168 hours. Cardiac Enzymes: No results for input(s): CKTOTAL, CKMB, CKMBINDEX, TROPONINI in the last 168 hours. BNP (last 3 results) No results for input(s): PROBNP in the last 8760 hours. HbA1C: No results for input(s): HGBA1C in the last 72 hours. CBG:  Recent Labs Lab 09/23/15 1419  GLUCAP 140*   Lipid Profile: No results for input(s): CHOL, HDL, LDLCALC, TRIG, CHOLHDL, LDLDIRECT in the last 72 hours. Thyroid Function Tests: No results for input(s): TSH, T4TOTAL, FREET4, T3FREE, THYROIDAB in the last 72 hours. Anemia Panel: No results for  input(s): VITAMINB12, FOLATE, FERRITIN, TIBC, IRON, RETICCTPCT in the last 72 hours. Urine analysis:    Component Value Date/Time   COLORURINE AMBER (A) 09/23/2015 1544   APPEARANCEUR HAZY (A) 09/23/2015 1544   LABSPEC 1.027 09/23/2015 1544   PHURINE 6.0 09/23/2015 1544   GLUCOSEU NEGATIVE 09/23/2015 1544   GLUCOSEU NEGATIVE 06/15/2015 1349   HGBUR MODERATE (A) 09/23/2015 1544   BILIRUBINUR SMALL (A) 09/23/2015 1544   KETONESUR 15 (A) 09/23/2015 1544   PROTEINUR 100 (A) 09/23/2015 1544   UROBILINOGEN 0.2 06/15/2015 1349   NITRITE NEGATIVE 09/23/2015 1544   LEUKOCYTESUR NEGATIVE 09/23/2015 1544   Sepsis Labs: @LABRCNTIP (procalcitonin:4,lacticidven:4)  ) Recent Results (from the past 240 hour(s))  MRSA PCR Screening     Status: None   Collection Time: 09/23/15 11:30 PM  Result Value Ref Range Status   MRSA by PCR NEGATIVE NEGATIVE Final    Comment:        The GeneXpert MRSA Assay (FDA approved for NASAL specimens only), is one component of a comprehensive MRSA  colonization surveillance program. It is not intended to diagnose MRSA infection nor to guide or monitor treatment for MRSA infections.     Radiology Studies: Dg Chest 2 View  Result Date: 09/23/2015 CLINICAL DATA:  Sepsis. EXAM: CHEST  2 VIEW COMPARISON:  April 29, 2013 FINDINGS: Mild atelectasis in the left lung base. No other acute abnormalities or changes. Anterior wedging of a lower thoracic vertebral body is stable since 2014. IMPRESSION: No active cardiopulmonary disease. Electronically Signed   By: Gerome Samavid  Williams III M.D   On: 09/23/2015 14:50   Scheduled Meds: . amLODipine  5 mg Oral q morning - 10a  . atorvastatin  40 mg Oral q morning - 10a  . azelastine  1 spray Each Nare BID  . azithromycin  500 mg Intravenous Q24H  . benztropine  0.5 mg Oral BID  . busPIRone  10 mg Oral TID  . calcium-vitamin D  1 tablet Oral TID WC  . cefTRIAXone (ROCEPHIN)  IV  1 g Intravenous Q24H  . enoxaparin (LOVENOX)  injection  40 mg Subcutaneous Q24H  . fluticasone  2 spray Each Nare Daily  . loratadine  10 mg Oral Daily  . pantoprazole  40 mg Oral Daily  . potassium chloride  30 mEq Oral Q4H  . pregabalin  75 mg Oral BID  . risperiDONE  3 mg Oral QHS  . sodium chloride  1,000 mL Intravenous Once   Continuous Infusions: . sodium chloride       LOS: 0 days   Time spent: 25 min  Hazeline Junkeryan Moreen Piggott, MD [facility] Pager [phone]  If 7PM-7AM, please contact night-coverage [email or web address] Password [email or web address] 09/24/2015, 12:11 PM

## 2015-09-24 NOTE — Progress Notes (Signed)
CRITICAL VALUE ALERT  Critical value received:  Lactic Acid 2.3  Date of notification:  09/24/15  Time of notification:  8:05  Critical value read back:Yes.    Nurse who received alert:  Midge AverVicki Anasofia Micallef RN  MD notified (1st page):  Dr. Jarvis NewcomerGrunz  Time of first page:  8:06

## 2015-09-24 NOTE — Progress Notes (Signed)
Call received from lab about critical lactic acid at 0002. MD paged at 0004. New orders placed for 0.45 sodium chloride bolus and lactic acid to be repeated.

## 2015-09-24 NOTE — Progress Notes (Signed)
CRITICAL VALUE ALERT  Critical value received:  Lactic Acid 2.6  Date of notification:  09/24/15  Time of notification:  11:55   Critical value read back:Yes.    Nurse who received alert:  Midge AverVicki Charliene Inoue RN  MD notified (1st page):  Dr. Jarvis NewcomerGrunz   Time of first page:  12:00

## 2015-09-24 NOTE — Progress Notes (Signed)
Occupational Therapy Treatment Patient Details Name: Katherine [name] MRN: [id number] DOB: 07-26-1945 Today's Date: 09/24/2015    History of present illness This 70 y.o. fmeald admitted with AMS and tachycardia.  Dx:  CAP, hypernatremia.  PMH includes:  anxiety, depression, DM, Fibromyalgia, HTN,    OT comments  Pt admitted with above. She demonstrates the below listed deficits and will benefit from continued OT to maximize safety and independence with BADLs.  Pt presents to OT with generalized weakness.  She requires min guard assist for ADLs.  She has periods of histrionic speech that is not appropriate to context of conversation.   CM spoke with pt's sister who reports this has been occurring for ~ 1 mos.  Sister concerned that medications may be contributing.  Sister reports pt has been functioning mod I at home despite this.  Sister also relays pt is seen at mental health clinic, but is uncertain what for.  She reports pt exhibited similar behaviors in high school.   Based on this, recommend psych consult.  Feel pt is likely okay to return home, but would recommend having below listed services to ensure pt returns to modified independent level, and to ensure she is truly functioning well at home.     Follow Up Recommendations  Home health OT;Supervision/Assistance - 24 hour;Other (comment) (HHPT, HHaide, HHSW, HHRN )    Equipment Recommendations  None recommended by OT    Recommendations for Other Services      Precautions / Restrictions Precautions Precautions: Fall       Mobility Bed Mobility Overal bed mobility: Modified Independent             General bed mobility comments: heavy use of bedrail   Transfers Overall transfer level: Needs assistance   Transfers: Sit to/from Stand;Stand Pivot Transfers Sit to Stand: Supervision Stand pivot transfers: Min guard            Balance Overall balance assessment: Needs assistance Sitting-balance support: Feet  supported Sitting balance-Leahy Scale: Good     Standing balance support: During functional activity Standing balance-Leahy Scale: Good                     ADL Overall ADL's : Needs assistance/impaired Eating/Feeding: Independent   Grooming: Wash/dry hands;Wash/dry face;Oral care;Brushing hair;Standing;Min guard Grooming Details (indicate cue type and reason): Pt with one LOB  Upper Body Bathing: Set up;Sitting   Lower Body Bathing: Min guard;Sit to/from stand   Upper Body Dressing : Set up;Sitting   Lower Body Dressing: Min guard;Sit to/from stand   Toilet Transfer: Supervision/safety;Ambulation;Regular Toilet;RW   Toileting- Architect and Hygiene: Min guard;Sit to/from stand       Functional mobility during ADLs: Therapist, music      Cognition   Behavior During Therapy: [facility] for tasks assessed/performed Overall Cognitive Status: No family/caregiver present to determine baseline cognitive functioning                  General Comments: Pt becomse very histrionic at times talking about events of the 60s, her dad dying, that she is illigitimate, Jews being persecuted, etc.  But, when not histrionic, cognition appears [facility].  spoke with CM, who phone sister, who reports these behaviors were present for ~ 1 mos PTA, and had been present  earlier in pt's life.  Sister reports pt is followed by Mental health and is concerned that possibly her medications are contributing to her behaviors      Extremity/Trunk Assessment  Upper Extremity Assessment Upper Extremity Assessment: Generalized weakness   Lower Extremity Assessment Lower Extremity Assessment: Defer to PT evaluation        Exercises     Shoulder Instructions       General Comments      Pertinent Vitals/ Pain          Home Living Family/patient expects to be discharged to:: Private residence Living  Arrangements: Alone Available Help at Discharge: Family;Friend(s);Available PRN/intermittently;Personal care attendant Type of Home: Apartment Home Access: Stairs to enter     Home Layout: One level     Bathroom Shower/Tub: Tub/shower unit;Curtain Shower/tub characteristics: Engineer, building servicesCurtain Bathroom Toilet: Standard     Home Equipment: Environmental consultantWalker - 2 wheels;Shower seat   Additional Comments: Pt reports she has PCA 1-2 hours/day 5days a week, how assists with breakfast and cleaning       Prior Functioning/Environment Level of Independence: Independent            Frequency Min 2X/week     Progress Toward Goals  OT Goals(current goals can now be found in the care plan section)     Acute Rehab OT Goals Patient Stated Goal: to go home  OT Goal Formulation: With patient Time For Goal Achievement: 10/01/15 Potential to Achieve Goals: Good ADL Goals Pt Will Perform Grooming: with modified independence;standing Pt Will Perform Upper Body Bathing: with modified independence;sitting Pt Will Perform Lower Body Bathing: with modified independence;sit to/from stand Pt Will Perform Upper Body Dressing: with modified independence;sitting Pt Will Perform Lower Body Dressing: with modified independence;sit to/from stand Pt Will Transfer to Toilet: with modified independence;ambulating;regular height toilet;bedside commode;grab bars Pt Will Perform Toileting - Clothing Manipulation and hygiene: with modified independence;sit to/from stand  Plan      Co-evaluation                 End of Session     Activity Tolerance Patient tolerated treatment well   Patient Left in chair;with call bell/phone within reach;with chair alarm set   Nurse Communication Mobility status    Functional Limitation: Self care Self Care Current Status (Z6109(G8987): At least 1 percent but less than 20 percent impaired, limited or restricted Self Care Goal Status (U0454(G8988): At least 1 percent but less than 20  percent impaired, limited or restricted   Time: [id number] OT Time Calculation (min): 26 min  Charges: OT G-codes **NOT FOR INPATIENT CLASS** Functional Limitation: Self care Self Care Current Status (N8295(G8987): At least 1 percent but less than 20 percent impaired, limited or restricted Self Care Goal Status (A2130(G8988): At least 1 percent but less than 20 percent impaired, limited or restricted OT General Charges $OT Visit: 1 Procedure OT Evaluation $OT Eval Moderate Complexity: 1 Procedure OT Treatments $Self Care/Home Management : 8-22 mins  Jianna Drabik M 09/24/2015, 4:28 PM

## 2015-09-24 NOTE — Evaluation (Signed)
Physical Therapy Evaluation Patient Details Name: Katherine [name] MRN: [id number] DOB: [birth year] Today's Date: 09/24/2015   History of Present Illness  This pt is a 70 y.o. female admitted with AMS and tachycardia.  Dx:  CAP, hypernatremia.  PMH includes:  anxiety, depression, DM, Fibromyalgia, HTN,   Clinical Impression  Pt presented supine in bed with HOB elevated, awake and willing to participate in therapy session. Prior to admission, pt reported needing assistance from a [facility] aide for dressing, bathing and cooking. Pt also stated using a RW to ambulate within her home. Although pt moved well during evaluation with min guard to ambulate for safety only and no physical assist was required, pt's safety secondary mental status was questionable. Pt with fluctuating cognition throughout session. PT recommending pt d/c to SNF at this time based on her cognitive presentation during this evaluation. Pt would continue to benefit from skilled physical therapy services at this time while admitted and after d/c to address her below listed limitations in order to improve her overall safety and independence with functional mobility.     Follow Up Recommendations SNF (if pt refuses SNF or is denied, pt would require maximal HH services including HH PT, HH OT, HH aide, HHSW, HHRN for her to be safe at home)    Equipment Recommendations  None recommended by PT    Recommendations for Other Services Other (comment) (psych consult)     Precautions / Restrictions Precautions Precautions: Fall Restrictions Weight Bearing Restrictions: No      Mobility  Bed Mobility Overal bed mobility: Needs Assistance Bed Mobility: Supine to Sit;Sit to Supine     Supine to sit: Supervision;HOB elevated Sit to supine: Supervision   General bed mobility comments: pt required increased time and use of bedrail  Transfers Overall transfer level: Needs assistance Equipment used: Rolling walker (2 wheeled) Transfers:  Sit to/from Stand Sit to Stand: Supervision Stand pivot transfers: Min guard       General transfer comment: pt required increased time  Ambulation/Gait Ambulation/Gait assistance: Min guard Ambulation Distance (Feet): 200 Feet Assistive device: Rolling walker (2 wheeled) Gait Pattern/deviations: Step-to pattern;Decreased stride length Gait velocity: decreased      Stairs            Wheelchair Mobility    Modified Rankin (Stroke Patients Only)       Balance Overall balance assessment: Needs assistance Sitting-balance support: Feet supported;No upper extremity supported Sitting balance-Leahy Scale: Fair     Standing balance support: During functional activity Standing balance-Leahy Scale: Fair                               Pertinent Vitals/Pain Pain Assessment: Faces Faces Pain Scale: No hurt Pain Intervention(s): Monitored during session    Home Living Family/patient expects to be discharged to:: Private residence Living Arrangements: Alone Available Help at Discharge: Family;Friend(s);Available PRN/intermittently;Personal care attendant Type of Home: Apartment Home Access: Stairs to enter     Home Layout: One level Home Equipment: Environmental consultant - 2 wheels;Shower seat Additional Comments: Pt reports she has PCA 1-2 hours/day 5days a week, how assists with breakfast and cleaning     Prior Function Level of Independence: Independent with assistive device(s);Needs assistance   Gait / Transfers Assistance Needed: pt reported using RW to ambulate within her home  ADL's / Homemaking Assistance Needed: pt stated having an aide to assist with dressing, bathing, cooking and housework  Hand Dominance   Dominant Hand: Right    Extremity/Trunk Assessment   Upper Extremity Assessment: Defer to OT evaluation           Lower Extremity Assessment: Generalized weakness         Communication   Communication: No difficulties  Cognition  Arousal/Alertness: Awake/alert Behavior During Therapy: WFL for tasks assessed/performed Overall Cognitive Status: No family/caregiver present to determine baseline cognitive functioning Area of Impairment: Attention   Current Attention Level: Alternating           General Comments: Pt's mentality fluctuating between moments of coherence to confusion and randomness, speaking of events that happened many years ago when she was a teenager. Pt's nurse tech stated that a visitor was with her earlier stating that her behavior is different than baseline. However, no caregiver or family present during evaluation to confirm that.    General Comments      Exercises        Assessment/Plan    PT Assessment Patient needs continued PT services  PT Diagnosis Generalized weakness   PT Problem List Decreased strength;Decreased activity tolerance;Decreased balance;Decreased mobility;Decreased coordination  PT Treatment Interventions DME instruction;Gait training;Stair training;Functional mobility training;Therapeutic activities;Therapeutic exercise;Balance training;Neuromuscular re-education;Patient/family education   PT Goals (Current goals can be found in the Care Plan section) Acute Rehab PT Goals Patient Stated Goal: to go home  PT Goal Formulation: With patient Time For Goal Achievement: 10/01/15 Potential to Achieve Goals: Good    Frequency Min 3X/week   Barriers to discharge        Co-evaluation               End of Session Equipment Utilized During Treatment: Gait belt Activity Tolerance: Patient tolerated treatment well Patient left: in bed;with call bell/phone within reach;with bed alarm set Nurse Communication: Mobility status    Functional Assessment Tool Used: clinical judgement Functional Limitation: Mobility: Walking and moving around Mobility: Walking and Moving Around Current Status (M5784(G8978): At least 1 percent but less than 20 percent impaired, limited or  restricted Mobility: Walking and Moving Around Goal Status [phone](G8979): 0 percent impaired, limited or restricted    Time: 1348-1420 PT Time Calculation (min) (ACUTE ONLY): 32 min   Charges:   PT Evaluation $PT Eval Moderate Complexity: 1 Procedure PT Treatments $Gait Training: 8-22 mins   PT G Codes:   PT G-Codes **NOT FOR INPATIENT CLASS** Functional Assessment Tool Used: clinical judgement Functional Limitation: Mobility: Walking and moving around Mobility: Walking and Moving Around Current Status (B2841(G8978): At least 1 percent but less than 20 percent impaired, limited or restricted Mobility: Walking and Moving Around Goal Status [phone](G8979): 0 percent impaired, limited or restricted    [facility]fk Medical CenterJennifer M Amrita Radu 09/24/2015, 4:44 PM Katherine ChalkJennifer Cecelia Graciano, PT, DPT [phone]

## 2015-09-24 NOTE — Progress Notes (Signed)
Pt potassium 2.8, MD notified at 0601.

## 2015-09-25 DIAGNOSIS — J189 Pneumonia, unspecified organism: Secondary | ICD-10-CM | POA: Diagnosis not present

## 2015-09-25 DIAGNOSIS — A419 Sepsis, unspecified organism: Secondary | ICD-10-CM | POA: Diagnosis not present

## 2015-09-25 LAB — BASIC METABOLIC PANEL
Anion gap: 8 (ref 5–15)
BUN: 15 mg/dL (ref 6–20)
CALCIUM: 8.1 mg/dL — AB (ref 8.9–10.3)
CO2: 25 mmol/L (ref 22–32)
CREATININE: 0.75 mg/dL (ref 0.44–1.00)
Chloride: 115 mmol/L — ABNORMAL HIGH (ref 101–111)
GFR calc non Af Amer: >60 mL/min (ref >60)
Glucose, Bld: 109 mg/dL — ABNORMAL HIGH (ref 65–99)
Potassium: 3.4 mmol/L — ABNORMAL LOW (ref 3.5–5.1)
SODIUM: 148 mmol/L — AB (ref 135–145)

## 2015-09-25 LAB — CBC
HCT: 38.4 % (ref 36.0–46.0)
Hemoglobin: 11.7 g/dL — ABNORMAL LOW (ref 12.0–15.0)
MCH: 29.9 pg (ref 26.0–34.0)
MCHC: 30.5 g/dL (ref 30.0–36.0)
MCV: 98.2 fL (ref 78.0–100.0)
Platelets: 240 10*3/uL (ref 150–400)
RBC: 3.91 MIL/uL (ref 3.87–5.11)
RDW: 14.3 % (ref 11.5–15.5)
WBC: 8 10*3/uL (ref 4.0–10.5)

## 2015-09-25 LAB — LACTIC ACID, PLASMA: LACTIC ACID, VENOUS: 2 mmol/L — AB (ref 0.5–1.9)

## 2015-09-25 LAB — LEGIONELLA PNEUMOPHILA SEROGP 1 UR AG: L. PNEUMOPHILA SEROGP 1 UR AG: NEGATIVE

## 2015-09-25 MED ORDER — ASPIRIN EC 81 MG PO TBEC: 81.0000 mg | DELAYED_RELEASE_TABLET | Freq: Every day | ORAL | 0 refills | Status: DC

## 2015-09-25 MED ORDER — AZITHROMYCIN 500 MG PO TABS
500.0000 mg | ORAL_TABLET | Freq: Every day | ORAL | Status: DC
  Administered 2015-09-25: 500 mg via ORAL
  Filled 2015-09-25: qty 1

## 2015-09-25 MED ORDER — AZITHROMYCIN 500 MG PO TABS: 500.0000 mg | ORAL_TABLET | Freq: Every day | ORAL | 0 refills | Status: DC

## 2015-09-25 NOTE — Care Management Note (Signed)
Case Management Note  Patient Details  Name: LAWRIE TUNKS MRN: [id number] Date of Birth: 07-16-1945  Subjective/Objective:                  AMS Action/Plan: Discharge planning Expected Discharge Date:  09/22/15               Expected Discharge Plan:  [place]  In-House Referral:     Discharge planning Services  CM Consult  Post Acute Care Choice:    Choice offered to:  Patient  DME Arranged:    DME Agency:     HH Arranged:  RN, PT, OT, Nurse's Aide, Social Work [employer] Agency:  [place]  Status of Service:  Completed, signed off  If discussed at [employer], dates discussed:    Additional Comments: CM met with pt to offer choice of home health agency.  Pt chooses [facility] to render HHPT/OT/RN/Aide/social worker. Referal called to [facility] rep, Tiffany.   Pt states she has all the DME she needs and Letta Median is providing transportation home.  No other CM needs were communicated. Dellie Catholic, RN 09/25/2015, 3:09 PM

## 2015-09-25 NOTE — Progress Notes (Signed)
CSW received consult regarding PT recommendation of SNF at discharge.  Patient and family friend are refusing SNF and would like home health services.  CSW signing off.   Osborne Cascoadia Ezio Wieck LCSWA [phone]

## 2015-09-25 NOTE — Discharge Summary (Signed)
Physician Discharge Summary  Katherine [name] WUJ:[id number] DOB: 30-Aug-1945 DOA: 09/23/2015  PCP: Oliver Barre, MD  Admit date: 09/23/2015 Discharge date: 09/25/2015  Admitted From: Home Disposition: Home  Recommendations for Outpatient Follow-up:  1. Follow up with PCP in 1 week 2. Please perform thorough medication reconciliation. Many medications were duplications and those that were not filled most recently were discontinued on discharge.  3. Please insure she is receiving home health services.  4. She requires follow up with psychiatry services at [facility].  Home Health: Yes, RN, CSW, PT, OT Equipment/Devices: None  Discharge Condition: Stable CODE STATUS: Full Diet recommendation: Heart healthy  Brief/Interim Summary: 70 y.o.femalewith medical history of HTN, GERD, fibromyalgia, and allergic rhinitis presenting with AMS, cough, and fever. She was noted to be tachycardic, febrile to 101.42F, with a leukocytosis of 15.4 and lactate elevation to 2.74, sodium 154. No hypoxemia. Troponin negative, ECG abnormal but not ischemic and stable from prior. CXR showed left lower atelectasis vs. early pneumonia. She was given IV abx for CAP, 1L NS bolus, and TRH were called for admission. She has remained stable overnight, lactate slowly trended downward with IVF's. On hospital day #2 she was visited by her close friend and quasi-caretaker, Lucendia Herrlich, who reported her mental status had returned to normal. She was not hypoxemic, sepsis had resolved, cultures negative at 2 days, without leukocytosis. Hypernatremia improved to 148 on day of discharge, and she was able to eat a full diet. PT and OT consultants recommended SNF vs. home health and the patient as well as her friend adamantly declined SNF. She will be discharged with a home health aide, RN, PT, OT, and social worker, in addition to her friend staying with her 24 hours per day for at least the next 72 hours.   Discharge Diagnoses:   Principal Problem:   CAP (community acquired pneumonia) Active Problems:   Anxiety state   Essential hypertension   Allergic rhinitis   GERD   Fibromyalgia   Altered mental status   Sepsis (HCC)   Hypernatremia   Dehydration  Discharge Instructions  Discharge Instructions    Discharge instructions    Complete by:  As directed   You were admitted for pneumonia and have done very well with treatment. You will need to continue this treatment in the form of AZITHROMYCIN 500mg  daily, take it once every day until they are gone.   Your medication list has been updated. You were taking way too many medications that interacted poorly. Many have been discontinued because they were duplications of other medications. Please take only the medications on this discharge list and STOP taking medications listed under the STOP taking medications list. Take this list to your doctor for further clarification.   If you have a change in mental status, fever, productive cough, trouble breathing, or any other major symptoms, please seek medical attention right away.   You will be receiving many home health services after discharge to make sure you are safe at home.   It was a pleasure getting to know you. Take care! - Dr. Jarvis Newcomer   Increase activity slowly    Complete by:  As directed       Medication List    STOP taking these medications   budesonide 32 MCG/ACT nasal spray Commonly known as:  RHINOCORT AQUA   etodolac 400 MG tablet Commonly known as:  LODINE   ibuprofen 800 MG tablet Commonly known as:  ADVIL,MOTRIN   omeprazole 20 MG capsule  Commonly known as:  PRILOSEC   ondansetron 4 MG tablet Commonly known as:  ZOFRAN   promethazine 12.5 MG tablet Commonly known as:  PHENERGAN   triamcinolone 55 MCG/ACT Aero nasal inhaler Commonly known as:  NASACORT AQ     TAKE these medications   albuterol 108 (90 Base) MCG/ACT inhaler Commonly known as:  PROVENTIL HFA;VENTOLIN HFA Inhale 2  puffs into the lungs every 6 (six) hours as needed for wheezing or shortness of breath.   alendronate 70 MG tablet Commonly known as:  FOSAMAX TAKE 1 TABLET BY MOUTH EVERY 7 DAYS. TAKE WITH A FULL GLASS OF WATER ON AN EMPTY STOMACH.   ALPRAZolam 0.5 MG tablet Commonly known as:  XANAX Take 0.5 mg by mouth 2 (two) times daily as needed for anxiety.   amLODipine 10 MG tablet Commonly known as:  NORVASC Take 0.5 tablets (5 mg total) by mouth every morning.   antiseptic oral rinse Liqd 15 mLs by Mouth Rinse route daily as needed for dry mouth.   aspirin EC 81 MG tablet Take 1 tablet (81 mg total) by mouth daily. What changed:  medication strength  how much to take  when to take this   atorvastatin 40 MG tablet Commonly known as:  LIPITOR Take 1 tablet (40 mg total) by mouth every morning.   azelastine 0.1 % nasal spray Commonly known as:  ASTELIN Place 1 spray into both nostrils 2 (two) times daily. Use in each nostril as directed   azithromycin 500 MG tablet Commonly known as:  ZITHROMAX Take 1 tablet (500 mg total) by mouth daily.   benztropine 0.5 MG tablet Commonly known as:  COGENTIN Take 0.5 mg by mouth 2 (two) times daily.   busPIRone 10 MG tablet Commonly known as:  BUSPAR Take 1 tablet (10 mg total) by mouth 3 (three) times daily.   calcium-vitamin D 500-200 MG-UNIT tablet Commonly known as:  OSCAL WITH D Take 1 tablet by mouth 3 (three) times daily.   cetirizine 10 MG tablet Commonly known as:  ZYRTEC TAKE 1 TABLET BY MOUTH DAILY   diclofenac sodium 1 % Gel Commonly known as:  VOLTAREN Apply 2 g topically daily as needed.   Diclofenac Sodium 1.5 % Soln Apply 20-40 drops up to 4 times a day   Flaxseed Oil 1000 MG Caps Take 1 capsule by mouth daily.   fluticasone 50 MCG/ACT nasal spray Commonly known as:  FLONASE USE 2 SPRAYS IN EACH NOSTRIL EVERY DAY   hydrOXYzine 10 MG tablet Commonly known as:  ATARAX/VISTARIL Take 1 tablet (10 mg total)  by mouth 2 (two) times daily as needed. What changed:  reasons to take this   ipratropium 0.06 % nasal spray Commonly known as:  ATROVENT USE 1 SPRAY IN EACH NOSTRIL THREE TIMES DAILY   multivitamin with minerals Tabs tablet Take 1 tablet by mouth daily.   naproxen sodium 220 MG tablet Commonly known as:  ANAPROX Take 220-440 mg by mouth 2 (two) times daily as needed (for pain).   omega-3 acid ethyl esters 1 g capsule Commonly known as:  LOVAZA Take by mouth.   ONE TOUCH LANCETS Misc Use to check blood sugars twice a day Dx E11.9   ONETOUCH DELICA LANCETS 33G Misc CHECK BLOOD SUGAR ONCE DAILY AS DIRECTED   ONETOUCH VERIO test strip Generic drug:  glucose blood 1 each by Other route 2 (two) times daily. Use to check blood sugars twice a day Dx E11.9   glucose blood test  strip Commonly known as:  ONETOUCH VERIO 1 each by Other route 2 (two) times daily. Use to check blood sugar twice a day Dx E11.9   pantoprazole 40 MG tablet Commonly known as:  PROTONIX Take 1 tablet (40 mg total) by mouth daily.   potassium chloride SA 20 MEQ tablet Commonly known as:  K-DUR,KLOR-CON Take 1 tablet (20 mEq total) by mouth 2 (two) times daily.   pregabalin 75 MG capsule Commonly known as:  LYRICA Take 1 capsule (75 mg total) by mouth 2 (two) times daily.   risperiDONE 3 MG tablet Commonly known as:  RISPERDAL Take 3 mg by mouth at bedtime.   sodium chloride 0.65 % Soln nasal spray Commonly known as:  OCEAN Place 1-2 sprays into both nostrils daily as needed for congestion.   tiZANidine 4 MG tablet Commonly known as:  ZANAFLEX Take 1 tablet (4 mg total) by mouth every 8 (eight) hours as needed for muscle spasms.   vitamin C 500 MG tablet Commonly known as:  ASCORBIC ACID Take 500 mg by mouth daily.      Follow-up Information    Oliver Barre, MD. Schedule an appointment as soon as possible for a visit in 1 week(s).   Specialties:  Internal Medicine, Radiology Contact  information: [address] [place] [address] [phone]        [facility] .   Why:  home health agency will call you to schedule visit Contact information: [address] [place] [address] [phone]          Allergies  Allergen Reactions  . Mellaril [Thioridazine] Itching and Other (See Comments)  . Penicillins     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 Has patient had a PCN reaction causing immediate rash, facial/tongue/throat swelling, SOB or lighthNoeadedness with hypotension: Yes Has patient had a PCN reaction causing severe rash involving mucus membranes or skin necrosis: Unknown Has patient had a PCN reaction that required hospitalization: Unknown Has patient had a PCN reaction occurring within the last 10 years: Unknown If all of the above answers are "NO", then may proceed with Cephalosporin use.   . Thioridazine Hcl Itching    Consultations:  None  Procedures/Studies: Dg Chest 2 View  Result Date: 09/23/2015 CLINICAL DATA:  Sepsis. EXAM: CHEST  2 VIEW COMPARISON:  April 29, 2013 FINDINGS: Mild atelectasis in the left lung base. No other acute abnormalities or changes. Anterior wedging of a lower thoracic vertebral body is stable since 2014. IMPRESSION: No active cardiopulmonary disease. Electronically Signed   By: Gerome Sam III M.D   On: 09/23/2015 14:50    Subjective: Pt feels improved, desires to go home. No chest pain, trouble breathing, eating well. Family friend reports she is back to her usual self.   Discharge Exam: Vitals:   09/25/15 0626 09/25/15 1435  BP: 118/89 (!) 75/53  Pulse: 66 79  Resp: 20 20  Temp: 98.9 F (37.2 C)    Vitals:   09/24/15 1351 09/24/15 2107 09/25/15 0626 09/25/15 1435  BP: 140/71 (!) 155/81 118/89 (!) 75/53  Pulse: 71 68 66 79  Resp: 20 18 20 20   Temp: 98 F (36.7 C) 98.1 F (36.7 C) 98.9 F (37.2 C)   TempSrc:      SpO2: 99% 99% 99% 99%  Weight:      Height:         General: Pt is alert, awake, not in acute distress Cardiovascular: RRR, S1/S2 +, no  rubs, no gallops Respiratory: CTA bilaterally, no wheezing, no rhonchi Abdominal: Soft, NT, ND, bowel sounds + Extremities: no edema, no cyanosis Psych: Alert, oriented, goal directed speech though with a religious overtone  The results of significant diagnostics from this hospitalization (including imaging, microbiology, ancillary and laboratory) are listed below for reference.     Microbiology: Recent Results (from the past 240 hour(s))  Culture, blood (Routine X 2)     Status: None (Preliminary result)   Collection Time: 09/23/15  3:30 PM  Result Value Ref Range Status   Specimen Description BLOOD LEFT WRIST  Final   Special Requests BOTTLES DRAWN AEROBIC AND ANAEROBIC 5CC  Final   Culture NO GROWTH 1 DAY  Final   Report Status PENDING  Incomplete  Urine culture     Status: None   Collection Time: 09/23/15  3:44 PM  Result Value Ref Range Status   Specimen Description URINE, RANDOM  Final   Special Requests NONE  Final   Culture NO GROWTH  Final   Report Status 09/24/2015 FINAL  Final  Culture, blood (Routine X 2)     Status: None (Preliminary result)   Collection Time: 09/23/15  4:04 PM  Result Value Ref Range Status   Specimen Description BLOOD RIGHT ARM  Final   Special Requests BOTTLES DRAWN AEROBIC AND ANAEROBIC 5CC  Final   Culture NO GROWTH < 24 HOURS  Final   Report Status PENDING  Incomplete  MRSA PCR Screening     Status: None   Collection Time: 09/23/15 11:30 PM  Result Value Ref Range Status   MRSA by PCR NEGATIVE NEGATIVE Final    Comment:        The GeneXpert MRSA Assay (FDA approved for NASAL specimens only), is one component of a comprehensive MRSA colonization surveillance program. It is not intended to diagnose MRSA infection nor to guide or monitor treatment for MRSA infections.      Labs: BNP (last 3 results) No results for input(s): BNP in the last  8760 hours. Basic Metabolic Panel:  Recent Labs Lab 09/23/15 1432 09/24/15 0443 09/24/15 1104 09/25/15 0638  NA 154* 150*  --  148*  K 3.8 2.8*  --  3.4*  CL 120* 116*  --  115*  CO2 20* 24  --  25  GLUCOSE 153* 152*  --  109*  BUN 34* 30*  --  15  CREATININE 0.98 0.80  --  0.75  CALCIUM 9.3 8.1*  --  8.1*  MG  --   --  1.9  --    Liver Function Tests:  Recent Labs Lab 09/23/15 1432  AST 112*  ALT 52  ALKPHOS 51  BILITOT 1.7*  PROT 8.3*  ALBUMIN 5.2*   No results for input(s): LIPASE, AMYLASE in the last 168 hours.  Recent Labs Lab 09/23/15 1614  AMMONIA 37*   CBC:  Recent Labs Lab 09/23/15 1432 09/24/15 0443 09/25/15 0638  WBC 15.4* 13.0* 8.0  NEUTROABS 11.9* 8.8*  --   HGB 16.8* 13.3 11.7*  HCT 51.8* 42.9 38.4  MCV 95.9 97.7 98.2  PLT 336 286 240   Cardiac Enzymes: No results for input(s): CKTOTAL, CKMB, CKMBINDEX, TROPONINI in the last 168 hours. BNP: Invalid input(s): POCBNP CBG:  Recent Labs Lab 09/23/15 1419  GLUCAP 140*   D-Dimer No results for input(s): DDIMER in the last 72 hours. Hgb A1c No results for input(s): HGBA1C in the last 72 hours. Lipid Profile No results for input(s): CHOL, HDL,  LDLCALC, TRIG, CHOLHDL, LDLDIRECT in the last 72 hours. Thyroid function studies No results for input(s): TSH, T4TOTAL, T3FREE, THYROIDAB in the last 72 hours.  Invalid input(s): FREET3 Anemia work up No results for input(s): VITAMINB12, FOLATE, FERRITIN, TIBC, IRON, RETICCTPCT in the last 72 hours. Urinalysis    Component Value Date/Time   COLORURINE AMBER (A) 09/23/2015 1544   APPEARANCEUR HAZY (A) 09/23/2015 1544   LABSPEC 1.027 09/23/2015 1544   PHURINE 6.0 09/23/2015 1544   GLUCOSEU NEGATIVE 09/23/2015 1544   GLUCOSEU NEGATIVE 06/15/2015 1349   HGBUR MODERATE (A) 09/23/2015 1544   BILIRUBINUR SMALL (A) 09/23/2015 1544   KETONESUR 15 (A) 09/23/2015 1544   PROTEINUR 100 (A) 09/23/2015 1544   UROBILINOGEN 0.2 06/15/2015 1349    NITRITE NEGATIVE 09/23/2015 1544   LEUKOCYTESUR NEGATIVE 09/23/2015 1544   Sepsis Labs Invalid input(s): PROCALCITONIN,  WBC,  LACTICIDVEN Microbiology Recent Results (from the past 240 hour(s))  Culture, blood (Routine X 2)     Status: None (Preliminary result)   Collection Time: 09/23/15  3:30 PM  Result Value Ref Range Status   Specimen Description BLOOD LEFT WRIST  Final   Special Requests BOTTLES DRAWN AEROBIC AND ANAEROBIC 5CC  Final   Culture NO GROWTH 1 DAY  Final   Report Status PENDING  Incomplete  Urine culture     Status: None   Collection Time: 09/23/15  3:44 PM  Result Value Ref Range Status   Specimen Description URINE, RANDOM  Final   Special Requests NONE  Final   Culture NO GROWTH  Final   Report Status 09/24/2015 FINAL  Final  Culture, blood (Routine X 2)     Status: None (Preliminary result)   Collection Time: 09/23/15  4:04 PM  Result Value Ref Range Status   Specimen Description BLOOD RIGHT ARM  Final   Special Requests BOTTLES DRAWN AEROBIC AND ANAEROBIC 5CC  Final   Culture NO GROWTH < 24 HOURS  Final   Report Status PENDING  Incomplete  MRSA PCR Screening     Status: None   Collection Time: 09/23/15 11:30 PM  Result Value Ref Range Status   MRSA by PCR NEGATIVE NEGATIVE Final    Comment:        The GeneXpert MRSA Assay (FDA approved for NASAL specimens only), is one component of a comprehensive MRSA colonization surveillance program. It is not intended to diagnose MRSA infection nor to guide or monitor treatment for MRSA infections.     Time coordinating discharge: Over 30 minutes  SIGNED: Hazeline Junker, MD  [facility] 09/25/2015, 4:17 PM Pager [phone]  If 7PM-7AM, please contact night-coverage [email or web address] Password [email or web address]

## 2015-09-26 DIAGNOSIS — R000 Tachycardia, unspecified: Secondary | ICD-10-CM | POA: Diagnosis not present

## 2015-09-26 DIAGNOSIS — F419 Anxiety disorder, unspecified: Secondary | ICD-10-CM | POA: Diagnosis not present

## 2015-09-26 DIAGNOSIS — F329 Major depressive disorder, single episode, unspecified: Secondary | ICD-10-CM | POA: Diagnosis not present

## 2015-09-26 DIAGNOSIS — E870 Hyperosmolality and hypernatremia: Secondary | ICD-10-CM | POA: Diagnosis not present

## 2015-09-26 DIAGNOSIS — I10 Essential (primary) hypertension: Secondary | ICD-10-CM | POA: Diagnosis not present

## 2015-09-26 DIAGNOSIS — E119 Type 2 diabetes mellitus without complications: Secondary | ICD-10-CM | POA: Diagnosis not present

## 2015-09-26 DIAGNOSIS — J189 Pneumonia, unspecified organism: Secondary | ICD-10-CM | POA: Diagnosis not present

## 2015-09-26 DIAGNOSIS — M797 Fibromyalgia: Secondary | ICD-10-CM | POA: Diagnosis not present

## 2015-09-27 ENCOUNTER — Telehealth: Payer: Self-pay | Admitting: *Deleted

## 2015-09-27 DIAGNOSIS — M797 Fibromyalgia: Secondary | ICD-10-CM | POA: Diagnosis not present

## 2015-09-27 DIAGNOSIS — E119 Type 2 diabetes mellitus without complications: Secondary | ICD-10-CM | POA: Diagnosis not present

## 2015-09-27 DIAGNOSIS — F419 Anxiety disorder, unspecified: Secondary | ICD-10-CM | POA: Diagnosis not present

## 2015-09-27 DIAGNOSIS — R000 Tachycardia, unspecified: Secondary | ICD-10-CM | POA: Diagnosis not present

## 2015-09-27 DIAGNOSIS — J189 Pneumonia, unspecified organism: Secondary | ICD-10-CM | POA: Diagnosis not present

## 2015-09-27 DIAGNOSIS — I10 Essential (primary) hypertension: Secondary | ICD-10-CM | POA: Diagnosis not present

## 2015-09-27 DIAGNOSIS — E870 Hyperosmolality and hypernatremia: Secondary | ICD-10-CM | POA: Diagnosis not present

## 2015-09-27 DIAGNOSIS — F329 Major depressive disorder, single episode, unspecified: Secondary | ICD-10-CM | POA: Diagnosis not present

## 2015-09-27 NOTE — Telephone Encounter (Signed)
Transition Care Management Follow-up Telephone Call   Date discharged? 09/25/15   How have you been since you were released from the hospital? Pt states she is doing alright   Do you understand why you were in the hospital? YES   Do you understand the discharge instructions? YES   Where were you discharged to? Home   Items Reviewed:  Medications reviewed: YES  Allergies reviewed: YES  Dietary changes reviewed: YES      Referrals reviewed: Yes pt states [facility] and they should could out tomorrow  Functional Questionnaire:   Activities of Daily Living (ADLs):   She states she are independent in the following: ambulation, bathing and hygiene, feeding, continence, grooming, toileting and dressing States she doesn't require assistance   Any transportation issues/concerns?: NO   Any patient concerns? NO   Confirmed importance and date/time of follow-up visits scheduled YES, appt made 10/05/15 Provider Appointment booked with Dr. Jonny RuizJohn  Confirmed with patient if condition begins to worsen call PCP or go to the ER.  Patient was given the office number and encouraged to call back with question or concerns.  : YES

## 2015-09-28 ENCOUNTER — Telehealth: Payer: Self-pay | Admitting: Internal Medicine

## 2015-09-28 DIAGNOSIS — M797 Fibromyalgia: Secondary | ICD-10-CM | POA: Diagnosis not present

## 2015-09-28 DIAGNOSIS — R000 Tachycardia, unspecified: Secondary | ICD-10-CM | POA: Diagnosis not present

## 2015-09-28 DIAGNOSIS — E119 Type 2 diabetes mellitus without complications: Secondary | ICD-10-CM | POA: Diagnosis not present

## 2015-09-28 DIAGNOSIS — J189 Pneumonia, unspecified organism: Secondary | ICD-10-CM | POA: Diagnosis not present

## 2015-09-28 DIAGNOSIS — I10 Essential (primary) hypertension: Secondary | ICD-10-CM | POA: Diagnosis not present

## 2015-09-28 DIAGNOSIS — F329 Major depressive disorder, single episode, unspecified: Secondary | ICD-10-CM | POA: Diagnosis not present

## 2015-09-28 DIAGNOSIS — F419 Anxiety disorder, unspecified: Secondary | ICD-10-CM | POA: Diagnosis not present

## 2015-09-28 DIAGNOSIS — E870 Hyperosmolality and hypernatremia: Secondary | ICD-10-CM | POA: Diagnosis not present

## 2015-09-28 LAB — CULTURE, BLOOD (ROUTINE X 2)
CULTURE: NO GROWTH
Culture: NO GROWTH

## 2015-09-28 NOTE — Telephone Encounter (Signed)
[name] [phone]   Need verbal orders for:  *OT 3 week 1 and 2 week 2. *Bath aid for 2 week 3

## 2015-09-28 NOTE — Telephone Encounter (Signed)
Ok for verbals 

## 2015-09-28 NOTE — Telephone Encounter (Signed)
[name] form [facility] called and needs verbal orders for 1 week 1 and 2 week 1 for strengthening and gait training. You can give these verbal orsers to [name] as well Thanks.

## 2015-09-29 DIAGNOSIS — J189 Pneumonia, unspecified organism: Secondary | ICD-10-CM | POA: Diagnosis not present

## 2015-09-29 DIAGNOSIS — E119 Type 2 diabetes mellitus without complications: Secondary | ICD-10-CM | POA: Diagnosis not present

## 2015-09-29 DIAGNOSIS — F419 Anxiety disorder, unspecified: Secondary | ICD-10-CM | POA: Diagnosis not present

## 2015-09-29 DIAGNOSIS — F329 Major depressive disorder, single episode, unspecified: Secondary | ICD-10-CM | POA: Diagnosis not present

## 2015-09-29 DIAGNOSIS — I10 Essential (primary) hypertension: Secondary | ICD-10-CM | POA: Diagnosis not present

## 2015-09-29 DIAGNOSIS — R000 Tachycardia, unspecified: Secondary | ICD-10-CM | POA: Diagnosis not present

## 2015-09-29 DIAGNOSIS — E870 Hyperosmolality and hypernatremia: Secondary | ICD-10-CM | POA: Diagnosis not present

## 2015-09-29 DIAGNOSIS — M797 Fibromyalgia: Secondary | ICD-10-CM | POA: Diagnosis not present

## 2015-09-29 NOTE — Telephone Encounter (Signed)
Verbals given  

## 2015-09-30 DIAGNOSIS — R000 Tachycardia, unspecified: Secondary | ICD-10-CM | POA: Diagnosis not present

## 2015-09-30 DIAGNOSIS — E870 Hyperosmolality and hypernatremia: Secondary | ICD-10-CM | POA: Diagnosis not present

## 2015-09-30 DIAGNOSIS — F419 Anxiety disorder, unspecified: Secondary | ICD-10-CM | POA: Diagnosis not present

## 2015-09-30 DIAGNOSIS — J189 Pneumonia, unspecified organism: Secondary | ICD-10-CM | POA: Diagnosis not present

## 2015-09-30 DIAGNOSIS — I10 Essential (primary) hypertension: Secondary | ICD-10-CM | POA: Diagnosis not present

## 2015-09-30 DIAGNOSIS — E119 Type 2 diabetes mellitus without complications: Secondary | ICD-10-CM | POA: Diagnosis not present

## 2015-09-30 DIAGNOSIS — F329 Major depressive disorder, single episode, unspecified: Secondary | ICD-10-CM | POA: Diagnosis not present

## 2015-09-30 DIAGNOSIS — M797 Fibromyalgia: Secondary | ICD-10-CM | POA: Diagnosis not present

## 2015-10-01 DIAGNOSIS — F419 Anxiety disorder, unspecified: Secondary | ICD-10-CM | POA: Diagnosis not present

## 2015-10-01 DIAGNOSIS — J189 Pneumonia, unspecified organism: Secondary | ICD-10-CM | POA: Diagnosis not present

## 2015-10-01 DIAGNOSIS — R000 Tachycardia, unspecified: Secondary | ICD-10-CM | POA: Diagnosis not present

## 2015-10-01 DIAGNOSIS — M542 Cervicalgia: Secondary | ICD-10-CM | POA: Diagnosis not present

## 2015-10-01 DIAGNOSIS — I10 Essential (primary) hypertension: Secondary | ICD-10-CM | POA: Diagnosis not present

## 2015-10-01 DIAGNOSIS — M797 Fibromyalgia: Secondary | ICD-10-CM | POA: Diagnosis not present

## 2015-10-01 DIAGNOSIS — E870 Hyperosmolality and hypernatremia: Secondary | ICD-10-CM | POA: Diagnosis not present

## 2015-10-01 DIAGNOSIS — F329 Major depressive disorder, single episode, unspecified: Secondary | ICD-10-CM | POA: Diagnosis not present

## 2015-10-01 DIAGNOSIS — R32 Unspecified urinary incontinence: Secondary | ICD-10-CM | POA: Diagnosis not present

## 2015-10-01 DIAGNOSIS — E119 Type 2 diabetes mellitus without complications: Secondary | ICD-10-CM | POA: Diagnosis not present

## 2015-10-04 DIAGNOSIS — J189 Pneumonia, unspecified organism: Secondary | ICD-10-CM | POA: Diagnosis not present

## 2015-10-04 DIAGNOSIS — R000 Tachycardia, unspecified: Secondary | ICD-10-CM | POA: Diagnosis not present

## 2015-10-04 DIAGNOSIS — M797 Fibromyalgia: Secondary | ICD-10-CM | POA: Diagnosis not present

## 2015-10-04 DIAGNOSIS — F329 Major depressive disorder, single episode, unspecified: Secondary | ICD-10-CM | POA: Diagnosis not present

## 2015-10-04 DIAGNOSIS — F419 Anxiety disorder, unspecified: Secondary | ICD-10-CM | POA: Diagnosis not present

## 2015-10-04 DIAGNOSIS — E119 Type 2 diabetes mellitus without complications: Secondary | ICD-10-CM | POA: Diagnosis not present

## 2015-10-04 DIAGNOSIS — I10 Essential (primary) hypertension: Secondary | ICD-10-CM | POA: Diagnosis not present

## 2015-10-04 DIAGNOSIS — E870 Hyperosmolality and hypernatremia: Secondary | ICD-10-CM | POA: Diagnosis not present

## 2015-10-05 ENCOUNTER — Inpatient Hospital Stay: Payer: Medicare Other | Admitting: Internal Medicine

## 2015-10-05 DIAGNOSIS — F329 Major depressive disorder, single episode, unspecified: Secondary | ICD-10-CM | POA: Diagnosis not present

## 2015-10-05 DIAGNOSIS — E870 Hyperosmolality and hypernatremia: Secondary | ICD-10-CM | POA: Diagnosis not present

## 2015-10-05 DIAGNOSIS — E119 Type 2 diabetes mellitus without complications: Secondary | ICD-10-CM | POA: Diagnosis not present

## 2015-10-05 DIAGNOSIS — J189 Pneumonia, unspecified organism: Secondary | ICD-10-CM | POA: Diagnosis not present

## 2015-10-05 DIAGNOSIS — I10 Essential (primary) hypertension: Secondary | ICD-10-CM | POA: Diagnosis not present

## 2015-10-05 DIAGNOSIS — F419 Anxiety disorder, unspecified: Secondary | ICD-10-CM | POA: Diagnosis not present

## 2015-10-05 DIAGNOSIS — R000 Tachycardia, unspecified: Secondary | ICD-10-CM | POA: Diagnosis not present

## 2015-10-05 DIAGNOSIS — M797 Fibromyalgia: Secondary | ICD-10-CM | POA: Diagnosis not present

## 2015-10-06 DIAGNOSIS — R000 Tachycardia, unspecified: Secondary | ICD-10-CM | POA: Diagnosis not present

## 2015-10-06 DIAGNOSIS — F419 Anxiety disorder, unspecified: Secondary | ICD-10-CM | POA: Diagnosis not present

## 2015-10-06 DIAGNOSIS — F329 Major depressive disorder, single episode, unspecified: Secondary | ICD-10-CM | POA: Diagnosis not present

## 2015-10-06 DIAGNOSIS — J189 Pneumonia, unspecified organism: Secondary | ICD-10-CM | POA: Diagnosis not present

## 2015-10-06 DIAGNOSIS — E870 Hyperosmolality and hypernatremia: Secondary | ICD-10-CM | POA: Diagnosis not present

## 2015-10-06 DIAGNOSIS — I10 Essential (primary) hypertension: Secondary | ICD-10-CM | POA: Diagnosis not present

## 2015-10-06 DIAGNOSIS — E119 Type 2 diabetes mellitus without complications: Secondary | ICD-10-CM | POA: Diagnosis not present

## 2015-10-06 DIAGNOSIS — M797 Fibromyalgia: Secondary | ICD-10-CM | POA: Diagnosis not present

## 2015-10-07 DIAGNOSIS — F329 Major depressive disorder, single episode, unspecified: Secondary | ICD-10-CM | POA: Diagnosis not present

## 2015-10-07 DIAGNOSIS — E870 Hyperosmolality and hypernatremia: Secondary | ICD-10-CM | POA: Diagnosis not present

## 2015-10-07 DIAGNOSIS — R000 Tachycardia, unspecified: Secondary | ICD-10-CM | POA: Diagnosis not present

## 2015-10-07 DIAGNOSIS — J189 Pneumonia, unspecified organism: Secondary | ICD-10-CM | POA: Diagnosis not present

## 2015-10-07 DIAGNOSIS — F419 Anxiety disorder, unspecified: Secondary | ICD-10-CM | POA: Diagnosis not present

## 2015-10-07 DIAGNOSIS — M797 Fibromyalgia: Secondary | ICD-10-CM | POA: Diagnosis not present

## 2015-10-07 DIAGNOSIS — I10 Essential (primary) hypertension: Secondary | ICD-10-CM | POA: Diagnosis not present

## 2015-10-07 DIAGNOSIS — E119 Type 2 diabetes mellitus without complications: Secondary | ICD-10-CM | POA: Diagnosis not present

## 2015-10-08 ENCOUNTER — Ambulatory Visit: Payer: Self-pay | Admitting: Podiatry

## 2015-10-08 DIAGNOSIS — E870 Hyperosmolality and hypernatremia: Secondary | ICD-10-CM | POA: Diagnosis not present

## 2015-10-08 DIAGNOSIS — M797 Fibromyalgia: Secondary | ICD-10-CM | POA: Diagnosis not present

## 2015-10-08 DIAGNOSIS — E119 Type 2 diabetes mellitus without complications: Secondary | ICD-10-CM | POA: Diagnosis not present

## 2015-10-08 DIAGNOSIS — I10 Essential (primary) hypertension: Secondary | ICD-10-CM | POA: Diagnosis not present

## 2015-10-08 DIAGNOSIS — F419 Anxiety disorder, unspecified: Secondary | ICD-10-CM | POA: Diagnosis not present

## 2015-10-08 DIAGNOSIS — R000 Tachycardia, unspecified: Secondary | ICD-10-CM | POA: Diagnosis not present

## 2015-10-08 DIAGNOSIS — J189 Pneumonia, unspecified organism: Secondary | ICD-10-CM | POA: Diagnosis not present

## 2015-10-08 DIAGNOSIS — F329 Major depressive disorder, single episode, unspecified: Secondary | ICD-10-CM | POA: Diagnosis not present

## 2015-10-11 DIAGNOSIS — R000 Tachycardia, unspecified: Secondary | ICD-10-CM | POA: Diagnosis not present

## 2015-10-11 DIAGNOSIS — I10 Essential (primary) hypertension: Secondary | ICD-10-CM | POA: Diagnosis not present

## 2015-10-11 DIAGNOSIS — F419 Anxiety disorder, unspecified: Secondary | ICD-10-CM | POA: Diagnosis not present

## 2015-10-11 DIAGNOSIS — F329 Major depressive disorder, single episode, unspecified: Secondary | ICD-10-CM | POA: Diagnosis not present

## 2015-10-11 DIAGNOSIS — E119 Type 2 diabetes mellitus without complications: Secondary | ICD-10-CM | POA: Diagnosis not present

## 2015-10-11 DIAGNOSIS — J189 Pneumonia, unspecified organism: Secondary | ICD-10-CM | POA: Diagnosis not present

## 2015-10-11 DIAGNOSIS — M797 Fibromyalgia: Secondary | ICD-10-CM | POA: Diagnosis not present

## 2015-10-11 DIAGNOSIS — E870 Hyperosmolality and hypernatremia: Secondary | ICD-10-CM | POA: Diagnosis not present

## 2015-10-12 ENCOUNTER — Ambulatory Visit: Payer: Self-pay | Admitting: Podiatry

## 2015-10-12 ENCOUNTER — Other Ambulatory Visit: Payer: Self-pay | Admitting: Internal Medicine

## 2015-10-13 ENCOUNTER — Telehealth: Payer: Self-pay

## 2015-10-13 DIAGNOSIS — F419 Anxiety disorder, unspecified: Secondary | ICD-10-CM | POA: Diagnosis not present

## 2015-10-13 DIAGNOSIS — E870 Hyperosmolality and hypernatremia: Secondary | ICD-10-CM | POA: Diagnosis not present

## 2015-10-13 DIAGNOSIS — M797 Fibromyalgia: Secondary | ICD-10-CM | POA: Diagnosis not present

## 2015-10-13 DIAGNOSIS — F329 Major depressive disorder, single episode, unspecified: Secondary | ICD-10-CM | POA: Diagnosis not present

## 2015-10-13 DIAGNOSIS — R000 Tachycardia, unspecified: Secondary | ICD-10-CM | POA: Diagnosis not present

## 2015-10-13 DIAGNOSIS — J189 Pneumonia, unspecified organism: Secondary | ICD-10-CM | POA: Diagnosis not present

## 2015-10-13 DIAGNOSIS — I10 Essential (primary) hypertension: Secondary | ICD-10-CM | POA: Diagnosis not present

## 2015-10-13 DIAGNOSIS — E119 Type 2 diabetes mellitus without complications: Secondary | ICD-10-CM | POA: Diagnosis not present

## 2015-10-13 NOTE — Telephone Encounter (Signed)
Home Health Cert/Plan of Care received signed (09/26/2015 - 11/24/2015). Faxed and copy sent to scan

## 2015-10-29 DIAGNOSIS — E782 Mixed hyperlipidemia: Secondary | ICD-10-CM | POA: Diagnosis not present

## 2015-10-29 DIAGNOSIS — R0602 Shortness of breath: Secondary | ICD-10-CM | POA: Diagnosis not present

## 2015-10-29 DIAGNOSIS — R5383 Other fatigue: Secondary | ICD-10-CM | POA: Diagnosis not present

## 2015-10-29 DIAGNOSIS — E559 Vitamin D deficiency, unspecified: Secondary | ICD-10-CM | POA: Diagnosis not present

## 2015-10-29 DIAGNOSIS — R7303 Prediabetes: Secondary | ICD-10-CM | POA: Diagnosis not present

## 2015-10-29 DIAGNOSIS — Z0000 Encounter for general adult medical examination without abnormal findings: Secondary | ICD-10-CM | POA: Diagnosis not present

## 2015-11-01 ENCOUNTER — Other Ambulatory Visit: Payer: Self-pay | Admitting: Internal Medicine

## 2015-11-02 ENCOUNTER — Other Ambulatory Visit: Payer: Self-pay | Admitting: Cardiology

## 2015-11-02 DIAGNOSIS — Z1231 Encounter for screening mammogram for malignant neoplasm of breast: Secondary | ICD-10-CM

## 2015-11-11 ENCOUNTER — Ambulatory Visit
Admission: RE | Admit: 2015-11-11 | Discharge: 2015-11-11 | Disposition: A | Discharge status: Home / Self Care | Payer: Medicare Other | Source: Ambulatory Visit | Attending: Cardiology | Admitting: Cardiology

## 2015-11-11 DIAGNOSIS — Z1231 Encounter for screening mammogram for malignant neoplasm of breast: Secondary | ICD-10-CM

## 2015-11-16 ENCOUNTER — Other Ambulatory Visit: Payer: Self-pay | Admitting: Internal Medicine

## 2015-11-17 DIAGNOSIS — M79671 Pain in right foot: Secondary | ICD-10-CM | POA: Diagnosis not present

## 2015-11-17 DIAGNOSIS — L851 Acquired keratosis [keratoderma] palmaris et plantaris: Secondary | ICD-10-CM | POA: Diagnosis not present

## 2015-11-17 DIAGNOSIS — M1990 Unspecified osteoarthritis, unspecified site: Secondary | ICD-10-CM | POA: Diagnosis not present

## 2015-11-17 DIAGNOSIS — L603 Nail dystrophy: Secondary | ICD-10-CM | POA: Diagnosis not present

## 2015-11-17 DIAGNOSIS — M79672 Pain in left foot: Secondary | ICD-10-CM | POA: Diagnosis not present

## 2015-11-22 DIAGNOSIS — Z1211 Encounter for screening for malignant neoplasm of colon: Secondary | ICD-10-CM | POA: Diagnosis not present

## 2015-11-25 DIAGNOSIS — E782 Mixed hyperlipidemia: Secondary | ICD-10-CM | POA: Diagnosis not present

## 2015-11-25 DIAGNOSIS — I739 Peripheral vascular disease, unspecified: Secondary | ICD-10-CM | POA: Diagnosis not present

## 2015-11-25 DIAGNOSIS — E0590 Thyrotoxicosis, unspecified without thyrotoxic crisis or storm: Secondary | ICD-10-CM | POA: Diagnosis not present

## 2015-11-25 DIAGNOSIS — I6529 Occlusion and stenosis of unspecified carotid artery: Secondary | ICD-10-CM | POA: Diagnosis not present

## 2015-11-25 DIAGNOSIS — Z23 Encounter for immunization: Secondary | ICD-10-CM | POA: Diagnosis not present

## 2015-11-25 DIAGNOSIS — I10 Essential (primary) hypertension: Secondary | ICD-10-CM | POA: Diagnosis not present

## 2015-11-29 ENCOUNTER — Telehealth: Payer: Self-pay

## 2015-11-29 NOTE — Telephone Encounter (Signed)
Patients next prolia injection is due either on/after 12/26/15---I have requested insurance benefit verification and will be calling patient soon with results----can talk with tamara if any questions

## 2015-12-07 ENCOUNTER — Telehealth: Payer: Self-pay

## 2015-12-07 NOTE — Telephone Encounter (Signed)
I called patient about prolia insurance verification, $0 copay---patient stated she is not coming to [place] anymore and hung up

## 2015-12-16 ENCOUNTER — Ambulatory Visit: Payer: Medicare Other | Admitting: Internal Medicine

## 2016-01-17 ENCOUNTER — Emergency Department (HOSPITAL_COMMUNITY): Payer: Medicare Other

## 2016-01-17 ENCOUNTER — Encounter (HOSPITAL_COMMUNITY): Payer: Self-pay | Admitting: Emergency Medicine

## 2016-01-17 ENCOUNTER — Emergency Department (HOSPITAL_COMMUNITY)
Admission: EM | Admit: 2016-01-17 | Discharge: 2016-01-17 | Disposition: A | Discharge status: Home / Self Care | Payer: Medicare Other | Attending: Emergency Medicine | Admitting: Emergency Medicine

## 2016-01-17 DIAGNOSIS — Z79899 Other long term (current) drug therapy: Secondary | ICD-10-CM | POA: Insufficient documentation

## 2016-01-17 DIAGNOSIS — R51 Headache: Secondary | ICD-10-CM | POA: Insufficient documentation

## 2016-01-17 DIAGNOSIS — Y999 Unspecified external cause status: Secondary | ICD-10-CM | POA: Insufficient documentation

## 2016-01-17 DIAGNOSIS — W0110XA Fall on same level from slipping, tripping and stumbling with subsequent striking against unspecified object, initial encounter: Secondary | ICD-10-CM | POA: Diagnosis not present

## 2016-01-17 DIAGNOSIS — Y92009 Unspecified place in unspecified non-institutional (private) residence as the place of occurrence of the external cause: Secondary | ICD-10-CM | POA: Insufficient documentation

## 2016-01-17 DIAGNOSIS — Y939 Activity, unspecified: Secondary | ICD-10-CM | POA: Diagnosis not present

## 2016-01-17 DIAGNOSIS — Z7982 Long term (current) use of aspirin: Secondary | ICD-10-CM | POA: Insufficient documentation

## 2016-01-17 DIAGNOSIS — E119 Type 2 diabetes mellitus without complications: Secondary | ICD-10-CM | POA: Insufficient documentation

## 2016-01-17 DIAGNOSIS — Z96652 Presence of left artificial knee joint: Secondary | ICD-10-CM | POA: Insufficient documentation

## 2016-01-17 DIAGNOSIS — I10 Essential (primary) hypertension: Secondary | ICD-10-CM | POA: Insufficient documentation

## 2016-01-17 DIAGNOSIS — M7918 Myalgia, other site: Secondary | ICD-10-CM

## 2016-01-17 DIAGNOSIS — W19XXXA Unspecified fall, initial encounter: Secondary | ICD-10-CM

## 2016-01-17 DIAGNOSIS — M25511 Pain in right shoulder: Secondary | ICD-10-CM | POA: Diagnosis not present

## 2016-01-17 NOTE — ED Notes (Signed)
Arm sling applied

## 2016-01-17 NOTE — ED Notes (Signed)
Bed: [facility] Expected date:  Expected time:  Means of arrival:  Comments: 

## 2016-01-17 NOTE — Discharge Instructions (Signed)
Please read and follow all provided instructions.  Your diagnoses today include:  1. Fall, initial encounter   2. Acute pain of right shoulder   3. Musculoskeletal pain    Tests performed today include: Vital signs. See below for your results today.   Medications prescribed:  Take as prescribed   Home care instructions:  Follow any educational materials contained in this packet.  Follow-up instructions: Please follow-up with your primary care provider for further evaluation of symptoms and treatment   Return instructions:  Please return to the Emergency Department if you do not get better, if you get worse, or new symptoms OR  - Fever (temperature greater than 101.22F)  - Bleeding that does not stop with holding pressure to the area    -Severe pain (please note that you may be more sore the day after your accident)  - Chest Pain  - Difficulty breathing  - Severe nausea or vomiting  - Inability to tolerate food and liquids  - Passing out  - Skin becoming red around your wounds  - Change in mental status (confusion or lethargy)  - New numbness or weakness    Please return if you have any other emergent concerns.  Additional Information:  Your vital signs today were: BP 140/84 (BP Location: Right Arm)    Pulse 82    Temp 97.8 F (36.6 C) (Oral)    Resp 18    SpO2 96%  If your blood pressure (BP) was elevated above 135/85 this visit, please have this repeated by your doctor within one month. ---------------

## 2016-01-17 NOTE — ED Notes (Signed)
Pt is alert and oriented x 4. Pt states that her face hurts on right side in where she hit her head on a trash can when she fell, and, her right arm hurts when when her body twisted during a fall. Pt also reports a head ache.

## 2016-01-17 NOTE — ED Triage Notes (Signed)
Per EMS pt complaint of worsening right upper arm pain post fall. Pt impact to left side with fall but complaint of worsening right arm pain which is usual for fibromyalgia pain. Pt no LOC or blood thinners.

## 2016-01-17 NOTE — ED Triage Notes (Signed)
Pt brought in by EMS, Pt had a fall and trip on her step on a heater  cord going into  to her residence, and  fell on her left side of her body, however denies left  Side body  pain, however does report pain to upper arm, and a headache.

## 2016-01-17 NOTE — ED Notes (Addendum)
Staff notified and responded via radio [facility] and VS need to be assessed.

## 2016-01-17 NOTE — ED Provider Notes (Signed)
[facility]-EMERGENCY DEPT Provider Note   CSN: [id number] Arrival date & time: 01/17/16  1911  By signing my name below, I, Linna DarnerRussell Turner, attest that this documentation has been prepared under the direction and in the presence of Audry Piliyler Juvencio Verdi, PA-C. Electronically Signed: Linna Darnerussell Turner, Scribe. 01/17/2016. 8:11 PM.  History   Chief Complaint Chief Complaint  Patient presents with  . Fall  . Arm Pain    The history is provided by the patient. No language interpreter was used.     HPI Comments: Katherine Queenamela A [name] is a 70 y.o. female brought in by EMS who presents to the Emergency Department complaining of an unwitnessed mechanical fall that occurred shortly PTA. She states she tripped on a cord while entering her home and landed on her left side. She notes she struck the right side of her face on a garbage can during the fall but denies losing consciousness. She denies any left-sided pain currently and states her most significant pain is from her right shoulder to her right elbow. She also notes some mild neck and back pain since the fall. Pt endorses right arm pain exacerbation with right arm raising. No medications or treatments tried PTA. She uses a baby aspirin daily. She denies numbness, color change, wounds, or any other associated symptoms.  Past Medical History:  Diagnosis Date  . ABDOMINAL PAIN, LOWER 02/04/2010  . ABNORMAL THYROID FUNCTION TESTS 08/04/2009  . ALLERGIC RHINITIS 09/05/2006  . ANXIETY 07/31/2007  . Blood transfusion   . Cataract    resolved -surgery done  . DEPRESSION 09/05/2006  . DIABETES MELLITUS, TYPE II 07/31/2007   no meds  . Diabetic foot ulcer (HCC) 10/26/2014  . DIVERTICULOSIS, COLON 09/05/2006  . Dysuria 04/15/2010  . Failure to thrive in childhood 04/06/2010  . FATIGUE 04/15/2010  . FIBROMYALGIA 05/10/2009  . FRACTURE, PELVIS, RIGHT 04/15/2010  . GASTROENTERITIS, ACUTE 12/25/2008  . GERD 09/05/2006  . Heart murmur   . HYPERLIPIDEMIA 09/05/2006  . HYPERTENSION 09/05/2006    . KNEE PAIN, LEFT 02/04/2010  . MRSA infection 10/26/2014  . OSTEOARTHRITIS 09/05/2006  . OSTEOPOROSIS 07/31/2007  . Other specified forms of hearing loss 11/01/2009  . SINUSITIS- ACUTE-NOS 10/09/2007  . SKIN LESION 05/10/2009  . SPRAIN&STRAIN OTH SPEC SITES SHOULDER&UPPER ARM 11/01/2009  . Unspecified hearing loss 10/09/2007  . URI 02/20/2007  . UTI 02/20/2007    Patient Active Problem List   Diagnosis Date Noted  . CAP (community acquired pneumonia) 09/23/2015  . Sepsis (HCC) 09/23/2015  . Hypernatremia 09/23/2015  . Dehydration 09/23/2015  . MRSA infection 10/26/2014  . Diabetic foot ulcer (HCC) 10/26/2014  . Pre-ulcerative corn or callous 10/09/2013  . UTI (urinary tract infection) 05/09/2013  . Hypokalemia 05/09/2013  . Altered mental status 04/29/2013  . Normocytic anemia 01/06/2013  . 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 Thyroid nodule 11/07/2012  . Aortic stenosis, mild 05/27/2012  . Heart murmur, systolic 05/09/2012  . Encounter for preventative adult health care exam with abnormal findings 10/07/2010  . FATIGUE 04/15/2010  . Dysuria 04/15/2010  . FRACTURE, PELVIS, RIGHT 04/15/2010  . Failure to thrive in childhood 04/06/2010  . SPRAIN&STRAIN OTH SPEC SITES SHOULDER&UPPER ARM 11/01/2009  . ABNORMAL THYROID FUNCTION TESTS 08/04/2009  . SKIN LESION 05/10/2009  . Fibromyalgia 05/10/2009  . Anxiety state 07/31/2007  . Osteoporosis 07/31/2007  . Hyperlipidemia 09/05/2006  . DEPRESSION 09/05/2006  . Essential hypertension 09/05/2006  . Allergic rhinitis 09/05/2006  . GERD 09/05/2006  . DIVERTICULOSIS, COLON 09/05/2006  .  OSTEOARTHRITIS 09/05/2006    Past Surgical History:  Procedure Laterality Date  . ABDOMINAL HYSTERECTOMY  12/08   Bladder tac, partial hysterectomy  . BREAST BIOPSY    . catarct removal     both eyes  . I&D KNEE WITH POLY EXCHANGE Left 01/03/2013   Procedure: IRRIGATION AND DEBRIDEMENT LEFT KNEE WITH POLY EXCHANGE;  Surgeon: Kathryne Hitch, MD;  Location: [facility];  Service: Orthopedics;  Laterality: Left;  . inguinal herniorrhapy     right Dr. Freida Busman 06/2009  . TONSILLECTOMY    . TOTAL KNEE ARTHROPLASTY Left 12/20/2012   Procedure: LEFT TOTAL KNEE ARTHROPLASTY;  Surgeon: Kathryne Hitch, MD;  Location: [facility];  Service: Orthopedics;  Laterality: Left;    OB History    No data available       Home Medications    Prior to Admission medications   Medication Sig Start Date End Date Taking? Authorizing Provider  albuterol (PROVENTIL HFA;VENTOLIN HFA) 108 (90 BASE) MCG/ACT inhaler Inhale 2 puffs into the lungs every 6 (six) hours as needed for wheezing or shortness of breath. 06/26/14   Corwin Levins, MD  alendronate (FOSAMAX) 70 MG tablet TAKE 1 TABLET BY MOUTH EVERY 7 DAYS. TAKE WITH A FULL GLASS OF WATER ON AN EMPTY STOMACH. 07/26/15   Corwin Levins, MD  ALPRAZolam Prudy Feeler) 0.5 MG tablet Take 0.5 mg by mouth 2 (two) times daily as needed for anxiety.    Historical Provider, MD  amLODipine (NORVASC) 10 MG tablet Take 0.5 tablets (5 mg total) by mouth every morning. 11/23/14   Corwin Levins, MD  antiseptic oral rinse (BIOTENE) LIQD 15 mLs by Mouth Rinse route daily as needed for dry mouth.    Historical Provider, MD  aspirin 81 MG tablet Take 1 tablet (81 mg total) by mouth daily. 09/25/15   Tyrone Nine, MD  atorvastatin (LIPITOR) 40 MG tablet Take 1 tablet (40 mg total) by mouth every morning. 02/18/15   Corwin Levins, MD  azelastine (ASTELIN) 0.1 % nasal spray Place 1 spray into both nostrils 2 (two) times daily. Use in each nostril as directed 11/23/14   Corwin Levins, MD  azithromycin (ZITHROMAX) 500 MG tablet Take 1 tablet (500 mg total) by mouth daily. 09/25/15   Tyrone Nine, MD  benztropine (COGENTIN) 0.5 MG tablet Take 0.5 mg by mouth 2 (two) times daily.      Historical Provider, MD  busPIRone (BUSPAR) 10 MG tablet Take 1 tablet (10 mg total) by mouth 3 (three) times daily. 12/11/13   Corwin Levins, MD    calcium-vitamin D (OSCAL WITH D) 500-200 MG-UNIT tablet Take 1 tablet by mouth 3 (three) times daily. 02/25/15   Corwin Levins, MD  cetirizine (ZYRTEC) 10 MG tablet TAKE 1 TABLET BY MOUTH DAILY 11/02/15   Corwin Levins, MD  diclofenac sodium (VOLTAREN) 1 % GEL Apply 2 g topically daily as needed.     Historical Provider, MD  Diclofenac Sodium 1.5 % SOLN Apply 20-40 drops up to 4 times a day 04/09/14   Historical Provider, MD  Flaxseed, Linseed, (FLAXSEED OIL) 1000 MG CAPS Take 1 capsule by mouth daily.    Historical Provider, MD  fluticasone [facility]) 50 MCG/ACT nasal spray USE 2 SPRAYS IN [facility] NOSTRIL EVERY DAY 07/26/15   Corwin Levins, MD  glucose blood [facility] VERIO) test strip 1 each by Other route 2 (two) times daily. Use to check blood sugars twice a day Dx E11.9  Historical Provider, MD  glucose blood (ONETOUCH VERIO) test strip 1 each by Other route 2 (two) times daily. Use to check blood sugar twice a day Dx E11.9 10/22/14   Corwin Levins, MD  hydrOXYzine (ATARAX/VISTARIL) 10 MG tablet Take 1 tablet (10 mg total) by mouth 2 (two) times daily as needed. Patient taking differently: Take 10 mg by mouth 2 (two) times daily as needed for anxiety.  12/11/13   Corwin Levins, MD  ipratropium (ATROVENT) 0.06 % nasal spray USE 1 SPRAY IN [facility] NOSTRIL THREE TIMES DAILY 07/26/15   Corwin Levins, MD  Multiple Vitamin (MULTIVITAMIN WITH MINERALS) TABS tablet Take 1 tablet by mouth daily.    Historical Provider, MD  naproxen sodium (ANAPROX) 220 MG tablet Take 220-440 mg by mouth 2 (two) times daily as needed (for pain).    Historical Provider, MD  omega-3 acid ethyl esters (LOVAZA) 1 g capsule Take by mouth.    Historical Provider, MD  ONE TOUCH LANCETS MISC Use to check blood sugars twice a day Dx E11.9 10/22/14   Corwin Levins, MD  [facility] DELICA LANCETS 33G MISC CHECK BLOOD SUGAR ONCE DAILY AS DIRECTED 06/23/15   Corwin Levins, MD  pantoprazole (PROTONIX) 40 MG tablet TAKE 1 TABLET BY MOUTH DAILY 11/16/15    Corwin Levins, MD  potassium chloride SA (K-DUR,KLOR-CON) 20 MEQ tablet Take 1 tablet (20 mEq total) by mouth 2 (two) times daily. 09/16/14   Corwin Levins, MD  pregabalin (LYRICA) 75 MG capsule Take 1 capsule (75 mg total) by mouth 2 (two) times daily. 01/09/13   Sharon Seller, NP  risperiDONE (RISPERDAL) 3 MG tablet Take 3 mg by mouth at bedtime.     Historical Provider, MD  sodium chloride (OCEAN) 0.65 % SOLN nasal spray Place 1-2 sprays into both nostrils daily as needed for congestion.    Historical Provider, MD  tiZANidine (ZANAFLEX) 4 MG tablet TAKE 1 TABLET BY MOUTH EVERY 8 HOURS AS NEEDED FOR MUSCLE SPASMS. 10/12/15   Corwin Levins, MD  vitamin C (ASCORBIC ACID) 500 MG tablet Take 500 mg by mouth daily.    Historical Provider, MD    Family History Family History  Problem Relation Age of Onset  . Coronary artery disease Other   . Diabetes Other   . Colon cancer Neg Hx   . Esophageal cancer Neg Hx   . Stomach cancer Neg Hx   . Rectal cancer Neg Hx     Social History Social History  Substance Use Topics  . Smoking status: Never Smoker  . Smokeless tobacco: Never Used  . Alcohol use No     Allergies   Mellaril [thioridazine]; Penicillins; and Thioridazine hcl   Review of Systems Review of Systems A complete 10 system review of systems was obtained and all systems are negative except as noted in the HPI and PMH.   Physical Exam Updated Vital Signs BP 140/84 (BP Location: Right Arm)   Pulse 82   Temp 97.8 F (36.6 C) (Oral)   Resp 18   SpO2 96%   Physical Exam  Constitutional: She is oriented to person, place, and time. Vital signs are normal. She appears well-developed and well-nourished. No distress.  HENT:  Head: Normocephalic and atraumatic. Head is without raccoon's eyes and without Battle's sign.  Right Ear: Hearing normal. No hemotympanum.  Left Ear: Hearing normal. No hemotympanum.  Nose: Nose normal.  Mouth/Throat: Uvula is midline, oropharynx is clear  and moist  and mucous membranes are normal.  Eyes: Conjunctivae and EOM are normal. Pupils are equal, round, and reactive to light.  Neck: Trachea normal and normal range of motion. Neck supple. No spinous process tenderness and no muscular tenderness present. No tracheal deviation and normal range of motion present.  Cardiovascular: Normal rate, regular rhythm, S1 normal, S2 normal, normal heart sounds, intact distal pulses and normal pulses.  Exam reveals no gallop and no friction rub.   No murmur heard. Pulmonary/Chest: Effort normal and breath sounds normal. No respiratory distress. She has no decreased breath sounds. She has no wheezes. She has no rhonchi. She has no rales.  Abdominal: Normal appearance and bowel sounds are normal. There is no tenderness. There is no rigidity and no guarding.  Musculoskeletal: Normal range of motion. She exhibits tenderness. She exhibits no deformity.  Right shoulder/right elbow: pain with passive/active ROM. Tender to palpation along shoulder joint with no obvious palpable or visible deformities. Neurovascularly intact. Distal pulses appreciated.  Tenderness along C-7. No palpable or visible deformities. ROM intact.  Neurological: She is alert and oriented to person, place, and time. She has normal strength. No cranial nerve deficit or sensory deficit.  Cranial Nerves:  II: Pupils equal, round, reactive to light III,IV, VI: ptosis not present, extra-ocular motions intact bilaterally  V,VII: smile symmetric, facial light touch sensation equal VIII: hearing grossly normal bilaterally  IX,X: midline uvula rise  XI: bilateral shoulder shrug equal and strong XII: midline tongue extension  Skin: Skin is warm and dry.  Psychiatric: She has a normal mood and affect. Her speech is normal and behavior is normal. Thought content normal.  Nursing note and vitals reviewed.  ED Treatments / Results  Labs (all labs ordered are listed, but only abnormal results are  displayed) Labs Reviewed - No data to display  EKG  EKG Interpretation None      Radiology Dg Shoulder Right  Result Date: 01/17/2016 CLINICAL DATA:  Initial evaluation for acute right shoulder pain. EXAM: RIGHT SHOULDER - 2+ VIEW COMPARISON:  Prior radiograph from 01/10/2015. FINDINGS: No acute fracture or dislocation. Humeral head is somewhat superior subluxed relative to the glenoid, suggesting underlying rotator cuff pathology. Degenerative osteoarthritic changes present at the right glenohumeral and acromioclavicular joints. Subacromial spurring noted. No periarticular calcification. No acute soft tissue abnormality. Mild diffuse osteopenia noted. Visualized right hemithorax is clear. IMPRESSION: 1. No acute osseous abnormality about the right shoulder. 2. Degenerative osteoarthrosis at the right shoulder joint with superior subluxation of the humeral head relative to the glenoid, suggesting underlying rotator cuff pathology. 3. Degenerative osteoarthrosis with subacromial spurring at the right [facility] joint. Electronically Signed   By: Rise Mu M.D.   On: 01/17/2016 21:14   Dg Elbow Complete Right  Result Date: 01/17/2016 CLINICAL DATA:  Initial evaluation for acute right elbow pain. EXAM: RIGHT ELBOW - COMPLETE 3+ VIEW COMPARISON:  None available. FINDINGS: No acute fracture or dislocation. No joint effusion. Degenerative spurring present at the olecranon an at the radial head. No acute soft tissue abnormality. Bones mildly osteopenic. IMPRESSION: 1. No acute osseous abnormality about the right elbow. 2. Mild to moderate degenerative osteoarthritic changes as above. Electronically Signed   By: Rise Mu M.D.   On: 01/17/2016 21:18   Ct Head Wo Contrast  Result Date: 01/17/2016 CLINICAL DATA:  Initial evaluation for acute trauma, fall. EXAM: CT HEAD WITHOUT CONTRAST CT CERVICAL SPINE WITHOUT CONTRAST TECHNIQUE: Multidetector CT imaging of the head and cervical spine was  performed following  the standard protocol without intravenous contrast. Multiplanar CT image reconstructions of the cervical spine were also generated. COMPARISON:  Prior study from 04/30/2013. FINDINGS: CT HEAD FINDINGS Brain: Cerebral volume within normal limits. Mild chronic microvascular ischemic changes is stable from previous. The the no acute intracranial hemorrhage. No findings to suggest acute large vessel territory infarct. No mass lesion, midline shift or mass effect. Ventricles are normal in size without evidence for hydrocephalus. No extra-axial fluid collection. Vascular: No hyperdense vessel. Scattered vascular calcifications present within the carotid siphons. Skull: Scalp soft tissues within normal limits.  Calvarium intact. Sinuses/Orbits: Globes and orbital soft tissues within normal limits. Patient is status post lens extraction bilaterally. Mild mucosal thickening within the right maxillary sinus. Paranasal sinuses are otherwise largely clear. No mastoid effusion. CT CERVICAL SPINE FINDINGS Alignment: Vertebral bodies normally aligned with preservation of the normal cervical lordosis. No listhesis for subluxation. Skull base and vertebrae: Skullbase intact. Normal C1-2 articulations are preserved. Dens is intact. Vertebral body heights are maintained. No acute fracture identified. Soft tissues and spinal canal: Visualized soft tissues of the neck demonstrate no acute abnormality. Scattered vascular calcifications present about the carotid bifurcations. No prevertebral edema. Disc levels: Moderate degenerative spondylolysis present at C3-4 and C5-6. More mild degenerative changes at C6-7. Upper chest: Visualized upper chest within normal limits. Visualized lung apices are clear. IMPRESSION: CT BRAIN: 1. No acute intracranial process identified. 2. Mild chronic microvascular ischemic disease, stable from previous. CT CERVICAL SPINE: 1. No acute traumatic injury within the cervical spine. 2.  Moderate degenerative spondylolysis at C3-4 and C5-6. Electronically Signed   By: Rise MuBenjamin  McClintock M.D.   On: 01/17/2016 21:37   Ct Cervical Spine Wo Contrast  Result Date: 01/17/2016 CLINICAL DATA:  Initial evaluation for acute trauma, fall. EXAM: CT HEAD WITHOUT CONTRAST CT CERVICAL SPINE WITHOUT CONTRAST TECHNIQUE: Multidetector CT imaging of the head and cervical spine was performed following the standard protocol without intravenous contrast. Multiplanar CT image reconstructions of the cervical spine were also generated. COMPARISON:  Prior study from 04/30/2013. FINDINGS: CT HEAD FINDINGS Brain: Cerebral volume within normal limits. Mild chronic microvascular ischemic changes is stable from previous. The the no acute intracranial hemorrhage. No findings to suggest acute large vessel territory infarct. No mass lesion, midline shift or mass effect. Ventricles are normal in size without evidence for hydrocephalus. No extra-axial fluid collection. Vascular: No hyperdense vessel. Scattered vascular calcifications present within the carotid siphons. Skull: Scalp soft tissues within normal limits.  Calvarium intact. Sinuses/Orbits: Globes and orbital soft tissues within normal limits. Patient is status post lens extraction bilaterally. Mild mucosal thickening within the right maxillary sinus. Paranasal sinuses are otherwise largely clear. No mastoid effusion. CT CERVICAL SPINE FINDINGS Alignment: Vertebral bodies normally aligned with preservation of the normal cervical lordosis. No listhesis for subluxation. Skull base and vertebrae: Skullbase intact. Normal C1-2 articulations are preserved. Dens is intact. Vertebral body heights are maintained. No acute fracture identified. Soft tissues and spinal canal: Visualized soft tissues of the neck demonstrate no acute abnormality. Scattered vascular calcifications present about the carotid bifurcations. No prevertebral edema. Disc levels: Moderate degenerative  spondylolysis present at C3-4 and C5-6. More mild degenerative changes at C6-7. Upper chest: Visualized upper chest within normal limits. Visualized lung apices are clear. IMPRESSION: CT BRAIN: 1. No acute intracranial process identified. 2. Mild chronic microvascular ischemic disease, stable from previous. CT CERVICAL SPINE: 1. No acute traumatic injury within the cervical spine. 2. Moderate degenerative spondylolysis at C3-4 and C5-6. Electronically Signed  By: Rise Mu M.D.   On: 01/17/2016 21:37    Procedures Procedures (including critical care time)  DIAGNOSTIC STUDIES: Oxygen Saturation is 96% on RA, adequate by my interpretation.    COORDINATION OF CARE: 8:20 PM Discussed treatment plan with pt at bedside and pt agreed to plan.  Medications Ordered in ED Medications - No data to display   Initial Impression / Assessment and Plan / ED Course  I have reviewed the triage vital signs and the nursing notes.  Pertinent labs & imaging results that were available during my care of the patient were reviewed by me and considered in my medical decision making (see chart for details).  Clinical Course    Final Clinical Impressions(s) / ED Diagnoses  I have reviewed and evaluated the relevant imaging studies.  I have reviewed the relevant previous healthcare records. I obtained HPI from historian. Patient discussed with supervising physician  ED Course:  Assessment: Pt is a 70yF who presents with mechanical fall. Pain on right arm as well as neck. No N/V. Mild head trauma. No headache. No visual changes. No blood thinners. On exam, pt in NAD. Nontoxic/nonseptic appearing. VSS. Afebrile. Lungs CTA. Heart RRR. Abdomen nontender soft. CN evaluated and unremarkable. Pain on ROM of right elbow/shoulder. NVI. CT Head/Neck unremarkable. Dg Right Shoulder with possible chronic rotator cuff injury, but no acute fracture/dislocation. DG Right elbow unremarkable. Plan is to DC home with  shoulder sling and follow up with PCP. At time of discharge, Patient is in no acute distress. Vital Signs are stable. Patient is able to ambulate. Patient able to tolerate PO.   Disposition/Plan:  DC Home Additional Verbal discharge instructions given and discussed with patient.  Pt Instructed to f/u with PCP in the next week for evaluation and treatment of symptoms. Return precautions given Pt acknowledges and agrees with plan  Supervising Physician Tilden Fossa, MD  Final diagnoses:  Fall, initial encounter  Acute pain of right shoulder  Musculoskeletal pain    New Prescriptions New Prescriptions   No medications on file   I personally performed the services described in this documentation, which was scribed in my presence. The recorded information has been reviewed and is accurate.    Audry Pili, PA-C 01/17/16 [id number]    Tilden Fossa, MD 01/19/16 [id number]

## 2016-05-18 ENCOUNTER — Emergency Department (HOSPITAL_COMMUNITY)
Admission: EM | Admit: 2016-05-18 | Discharge: 2016-05-18 | Disposition: A | Discharge status: Home / Self Care | Payer: Medicare Other | Attending: Emergency Medicine | Admitting: Emergency Medicine

## 2016-05-18 ENCOUNTER — Encounter (HOSPITAL_COMMUNITY): Payer: Self-pay | Admitting: Nurse Practitioner

## 2016-05-18 ENCOUNTER — Emergency Department (HOSPITAL_COMMUNITY): Payer: Medicare Other

## 2016-05-18 DIAGNOSIS — S0990XA Unspecified injury of head, initial encounter: Secondary | ICD-10-CM | POA: Diagnosis present

## 2016-05-18 DIAGNOSIS — W0110XA Fall on same level from slipping, tripping and stumbling with subsequent striking against unspecified object, initial encounter: Secondary | ICD-10-CM | POA: Insufficient documentation

## 2016-05-18 DIAGNOSIS — I10 Essential (primary) hypertension: Secondary | ICD-10-CM | POA: Diagnosis not present

## 2016-05-18 DIAGNOSIS — Z7982 Long term (current) use of aspirin: Secondary | ICD-10-CM | POA: Insufficient documentation

## 2016-05-18 DIAGNOSIS — W19XXXA Unspecified fall, initial encounter: Secondary | ICD-10-CM

## 2016-05-18 DIAGNOSIS — Z96652 Presence of left artificial knee joint: Secondary | ICD-10-CM | POA: Diagnosis not present

## 2016-05-18 DIAGNOSIS — Y9289 Other specified places as the place of occurrence of the external cause: Secondary | ICD-10-CM | POA: Insufficient documentation

## 2016-05-18 DIAGNOSIS — E119 Type 2 diabetes mellitus without complications: Secondary | ICD-10-CM | POA: Diagnosis not present

## 2016-05-18 DIAGNOSIS — S0101XA Laceration without foreign body of scalp, initial encounter: Secondary | ICD-10-CM | POA: Diagnosis not present

## 2016-05-18 DIAGNOSIS — Z23 Encounter for immunization: Secondary | ICD-10-CM | POA: Insufficient documentation

## 2016-05-18 DIAGNOSIS — Y9389 Activity, other specified: Secondary | ICD-10-CM | POA: Insufficient documentation

## 2016-05-18 DIAGNOSIS — Z79899 Other long term (current) drug therapy: Secondary | ICD-10-CM | POA: Diagnosis not present

## 2016-05-18 DIAGNOSIS — Y999 Unspecified external cause status: Secondary | ICD-10-CM | POA: Diagnosis not present

## 2016-05-18 MED ORDER — ACETAMINOPHEN 325 MG PO TABS
650.0000 mg | ORAL_TABLET | Freq: Once | ORAL | Status: AC
  Administered 2016-05-18: 650 mg via ORAL
  Filled 2016-05-18: qty 2

## 2016-05-18 MED ORDER — TETANUS-DIPHTH-ACELL PERTUSSIS 5-2.5-18.5 LF-MCG/0.5 IM SUSP
0.5000 mL | Freq: Once | INTRAMUSCULAR | Status: AC
  Administered 2016-05-18: 0.5 mL via INTRAMUSCULAR
  Filled 2016-05-18: qty 0.5

## 2016-05-18 NOTE — ED Notes (Signed)
Patient's linens changed by RNs and EDT maintaining C-spine precautions.

## 2016-05-18 NOTE — ED Provider Notes (Signed)
[facility]-EMERGENCY DEPT Provider Note   CSN: [id number] Arrival date & time: 05/18/16  1709     History   Chief Complaint Chief Complaint  Patient presents with  . Fall    HPI Katherine [name] is a 71 y.o. female.  Patient states that she slipped and fell and hit her head but had no loss of consciousness   The history is provided by the patient. No language interpreter was used.  Fall  This is a new problem. The current episode started 3 to 5 hours ago. The problem occurs rarely. The problem has been resolved. Pertinent negatives include no chest pain, no abdominal pain and no headaches. Nothing aggravates the symptoms. Nothing relieves the symptoms.    Past Medical History:  Diagnosis Date  . ABDOMINAL PAIN, LOWER 02/04/2010  . ABNORMAL THYROID FUNCTION TESTS 08/04/2009  . ALLERGIC RHINITIS 09/05/2006  . ANXIETY 07/31/2007  . Blood transfusion   . Cataract    resolved -surgery done  . DEPRESSION 09/05/2006  . DIABETES MELLITUS, TYPE II 07/31/2007   no meds  . Diabetic foot ulcer (HCC) 10/26/2014  . DIVERTICULOSIS, COLON 09/05/2006  . Dysuria 04/15/2010  . Failure to thrive in childhood 04/06/2010  . FATIGUE 04/15/2010  . FIBROMYALGIA 05/10/2009  . FRACTURE, PELVIS, RIGHT 04/15/2010  . GASTROENTERITIS, ACUTE 12/25/2008  . GERD 09/05/2006  . Heart murmur   . HYPERLIPIDEMIA 09/05/2006  . HYPERTENSION 09/05/2006  . KNEE PAIN, LEFT 02/04/2010  . MRSA infection 10/26/2014  . OSTEOARTHRITIS 09/05/2006  . OSTEOPOROSIS 07/31/2007  . Other specified forms of hearing loss 11/01/2009  . SINUSITIS- ACUTE-NOS 10/09/2007  . SKIN LESION 05/10/2009  . SPRAIN&STRAIN OTH SPEC SITES SHOULDER&UPPER ARM 11/01/2009  . Unspecified hearing loss 10/09/2007  . URI 02/20/2007  . UTI 02/20/2007    Patient Active Problem List   Diagnosis Date Noted  . CAP (community acquired pneumonia) 09/23/2015  . Sepsis (HCC) 09/23/2015  . Hypernatremia 09/23/2015  . Dehydration 09/23/2015  . MRSA infection 10/26/2014  . Diabetic foot  ulcer (HCC) 10/26/2014  . Pre-ulcerative corn or callous 10/09/2013  . UTI (urinary tract infection) 05/09/2013  . Hypokalemia 05/09/2013  . Altered mental status 04/29/2013  . Normocytic anemia 01/06/2013  . 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 Thyroid nodule 11/07/2012  . Aortic stenosis, mild 05/27/2012  . Heart murmur, systolic 05/09/2012  . Encounter for preventative adult health care exam with abnormal findings 10/07/2010  . FATIGUE 04/15/2010  . Dysuria 04/15/2010  . FRACTURE, PELVIS, RIGHT 04/15/2010  . Failure to thrive in childhood 04/06/2010  . SPRAIN&STRAIN OTH SPEC SITES SHOULDER&UPPER ARM 11/01/2009  . ABNORMAL THYROID FUNCTION TESTS 08/04/2009  . SKIN LESION 05/10/2009  . Fibromyalgia 05/10/2009  . Anxiety state 07/31/2007  . Osteoporosis 07/31/2007  . Hyperlipidemia 09/05/2006  . DEPRESSION 09/05/2006  . Essential hypertension 09/05/2006  . Allergic rhinitis 09/05/2006  . GERD 09/05/2006  . DIVERTICULOSIS, COLON 09/05/2006  . OSTEOARTHRITIS 09/05/2006    Past Surgical History:  Procedure Laterality Date  . ABDOMINAL HYSTERECTOMY  12/08   Bladder tac, partial hysterectomy  . BREAST BIOPSY    . catarct removal     both eyes  . I&D KNEE WITH POLY EXCHANGE Left 01/03/2013   Procedure: IRRIGATION AND DEBRIDEMENT LEFT KNEE WITH POLY EXCHANGE;  Surgeon: Kathryne Hitch, MD;  Location: [facility];  Service: Orthopedics;  Laterality: Left;  . inguinal herniorrhapy     right Dr. Freida Busman 06/2009  . TONSILLECTOMY    . TOTAL KNEE ARTHROPLASTY  Left 12/20/2012   Procedure: LEFT TOTAL KNEE ARTHROPLASTY;  Surgeon: Kathryne Hitch, MD;  Location: [facility];  Service: Orthopedics;  Laterality: Left;    OB History    No data available       Home Medications    Prior to Admission medications   Medication Sig Start Date End Date Taking? Authorizing Provider  albuterol (PROVENTIL HFA;VENTOLIN HFA) 108 (90 BASE) MCG/ACT inhaler Inhale 2 puffs into  the lungs every 6 (six) hours as needed for wheezing or shortness of breath. 06/26/14   Corwin Levins, MD  alendronate (FOSAMAX) 70 MG tablet TAKE 1 TABLET BY MOUTH EVERY 7 DAYS. TAKE WITH A FULL GLASS OF WATER ON AN EMPTY STOMACH. 07/26/15   Corwin Levins, MD  ALPRAZolam Prudy Feeler) 0.5 MG tablet Take 0.5 mg by mouth 2 (two) times daily as needed for anxiety.    Historical Provider, MD  amLODipine (NORVASC) 10 MG tablet Take 0.5 tablets (5 mg total) by mouth every morning. 11/23/14   Corwin Levins, MD  antiseptic oral rinse (BIOTENE) LIQD 15 mLs by Mouth Rinse route daily as needed for dry mouth.    Historical Provider, MD  aspirin 81 MG tablet Take 1 tablet (81 mg total) by mouth daily. 09/25/15   Tyrone Nine, MD  atorvastatin (LIPITOR) 40 MG tablet Take 1 tablet (40 mg total) by mouth every morning. 02/18/15   Corwin Levins, MD  azelastine (ASTELIN) 0.1 % nasal spray Place 1 spray into both nostrils 2 (two) times daily. Use in each nostril as directed 11/23/14   Corwin Levins, MD  azithromycin (ZITHROMAX) 500 MG tablet Take 1 tablet (500 mg total) by mouth daily. 09/25/15   Tyrone Nine, MD  benztropine (COGENTIN) 0.5 MG tablet Take 0.5 mg by mouth 2 (two) times daily.      Historical Provider, MD  busPIRone (BUSPAR) 10 MG tablet Take 1 tablet (10 mg total) by mouth 3 (three) times daily. 12/11/13   Corwin Levins, MD  calcium-vitamin D (OSCAL WITH D) 500-200 MG-UNIT tablet Take 1 tablet by mouth 3 (three) times daily. 02/25/15   Corwin Levins, MD  cetirizine (ZYRTEC) 10 MG tablet TAKE 1 TABLET BY MOUTH DAILY 11/02/15   Corwin Levins, MD  diclofenac sodium (VOLTAREN) 1 % GEL Apply 2 g topically daily as needed.     Historical Provider, MD  Diclofenac Sodium 1.5 % SOLN Apply 20-40 drops up to 4 times a day 04/09/14   Historical Provider, MD  Flaxseed, Linseed, (FLAXSEED OIL) 1000 MG CAPS Take 1 capsule by mouth daily.    Historical Provider, MD  fluticasone [facility]) 50 MCG/ACT nasal spray USE 2 SPRAYS IN [facility] NOSTRIL  EVERY DAY 07/26/15   Corwin Levins, MD  glucose blood [facility] VERIO) test strip 1 each by Other route 2 (two) times daily. Use to check blood sugars twice a day Dx E11.9    Historical Provider, MD  glucose blood (ONETOUCH VERIO) test strip 1 each by Other route 2 (two) times daily. Use to check blood sugar twice a day Dx E11.9 10/22/14   Corwin Levins, MD  hydrOXYzine (ATARAX/VISTARIL) 10 MG tablet Take 1 tablet (10 mg total) by mouth 2 (two) times daily as needed. Patient taking differently: Take 10 mg by mouth 2 (two) times daily as needed for anxiety.  12/11/13   Corwin Levins, MD  ipratropium (ATROVENT) 0.06 % nasal spray USE 1 SPRAY IN EACH NOSTRIL THREE TIMES DAILY  07/26/15   Corwin Levins, MD  Multiple Vitamin (MULTIVITAMIN WITH MINERALS) TABS tablet Take 1 tablet by mouth daily.    Historical Provider, MD  naproxen sodium (ANAPROX) 220 MG tablet Take 220-440 mg by mouth 2 (two) times daily as needed (for pain).    Historical Provider, MD  omega-3 acid ethyl esters (LOVAZA) 1 g capsule Take by mouth.    Historical Provider, MD  ONE TOUCH LANCETS MISC Use to check blood sugars twice a day Dx E11.9 10/22/14   Corwin Levins, MD  [facility] DELICA LANCETS 33G MISC CHECK BLOOD SUGAR ONCE DAILY AS DIRECTED 06/23/15   Corwin Levins, MD  pantoprazole (PROTONIX) 40 MG tablet TAKE 1 TABLET BY MOUTH DAILY 11/16/15   Corwin Levins, MD  potassium chloride SA (K-DUR,KLOR-CON) 20 MEQ tablet Take 1 tablet (20 mEq total) by mouth 2 (two) times daily. 09/16/14   Corwin Levins, MD  pregabalin (LYRICA) 75 MG capsule Take 1 capsule (75 mg total) by mouth 2 (two) times daily. 01/09/13   Sharon Seller, NP  risperiDONE (RISPERDAL) 3 MG tablet Take 3 mg by mouth at bedtime.     Historical Provider, MD  sodium chloride (OCEAN) 0.65 % SOLN nasal spray Place 1-2 sprays into both nostrils daily as needed for congestion.    Historical Provider, MD  tiZANidine (ZANAFLEX) 4 MG tablet TAKE 1 TABLET BY MOUTH EVERY 8 HOURS AS NEEDED  FOR MUSCLE SPASMS. 10/12/15   Corwin Levins, MD  vitamin C (ASCORBIC ACID) 500 MG tablet Take 500 mg by mouth daily.    Historical Provider, MD    Family History Family History  Problem Relation Age of Onset  . Coronary artery disease Other   . Diabetes Other   . Colon cancer Neg Hx   . Esophageal cancer Neg Hx   . Stomach cancer Neg Hx   . Rectal cancer Neg Hx     Social History Social History  Substance Use Topics  . Smoking status: Never Smoker  . Smokeless tobacco: Never Used  . Alcohol use No     Allergies   Mellaril [thioridazine]; Penicillins; and Thioridazine hcl   Review of Systems Review of Systems  Constitutional: Negative for appetite change and fatigue.  HENT: Negative for congestion, ear discharge and sinus pressure.        Headache  Eyes: Negative for discharge.  Respiratory: Negative for cough.   Cardiovascular: Negative for chest pain.  Gastrointestinal: Negative for abdominal pain and diarrhea.  Genitourinary: Negative for frequency and hematuria.  Musculoskeletal: Negative for back pain.  Skin: Negative for rash.  Neurological: Negative for seizures and headaches.  Psychiatric/Behavioral: Negative for hallucinations.     Physical Exam Updated Vital Signs BP (!) 145/93   Pulse (!) 58   Temp 97.7 F (36.5 C) (Oral)   Resp 16   Ht 4' 10.5" (1.486 m)   Wt 159 lb (72.1 kg)   SpO2 98%   BMI 32.67 kg/m   Physical Exam  Constitutional: She is oriented to person, place, and time. She appears well-developed.  HENT:  Head: Normocephalic.  1 cm laceration to occipital head  Eyes: Conjunctivae and EOM are normal. No scleral icterus.  Neck: Neck supple. No thyromegaly present.  Cardiovascular: Normal rate and regular rhythm.  Exam reveals no gallop and no friction rub.   No murmur heard. Pulmonary/Chest: No stridor. She has no wheezes. She has no rales. She exhibits no tenderness.  Abdominal: She exhibits no distension.  There is no tenderness.  There is no rebound.  Musculoskeletal: Normal range of motion. She exhibits no edema.  Lymphadenopathy:    She has no cervical adenopathy.  Neurological: She is oriented to person, place, and time. She exhibits normal muscle tone. Coordination normal.  Skin: No rash noted. No erythema.  Psychiatric: She has a normal mood and affect. Her behavior is normal.     ED Treatments / Results  Labs (all labs ordered are listed, but only abnormal results are displayed) Labs Reviewed - No data to display  EKG  EKG Interpretation None       Radiology Ct Head Wo Contrast  Result Date: 05/18/2016 CLINICAL DATA:  Head laceration and neck pain after fall. EXAM: CT HEAD WITHOUT CONTRAST CT CERVICAL SPINE WITHOUT CONTRAST TECHNIQUE: Multidetector CT imaging of the head and cervical spine was performed following the standard protocol without intravenous contrast. Multiplanar CT image reconstructions of the cervical spine were also generated. COMPARISON:  CT scan of January 17, 2016. FINDINGS: CT HEAD FINDINGS Brain: Mild chronic ischemic white matter disease is noted. No mass effect or midline shift is noted. Ventricular size is within normal limits. There is no evidence of mass lesion, hemorrhage or acute infarction. Vascular: Atherosclerosis of carotid siphons is noted. Skull: Normal. Negative for fracture or focal lesion. Sinuses/Orbits: No acute finding. Other: None. CT CERVICAL SPINE FINDINGS Alignment: Normal. Skull base and vertebrae: No acute fracture. No primary bone lesion or focal pathologic process. Soft tissues and spinal canal: Atherosclerotic calcifications of carotid arteries are noted. Disc levels: Moderate degenerative disc disease is noted at C3-4, C5-6 and C6-7 with anterior osteophyte formation. Upper chest: Negative. Other: None. IMPRESSION: Mild chronic ischemic white matter disease. No acute intracranial abnormality seen. Multilevel degenerative disc disease. No acute abnormality seen  in the cervical spine. Electronically Signed   By: Lupita Raider, M.D.   On: 05/18/2016 20:49   Ct Cervical Spine Wo Contrast  Result Date: 05/18/2016 CLINICAL DATA:  Head laceration and neck pain after fall. EXAM: CT HEAD WITHOUT CONTRAST CT CERVICAL SPINE WITHOUT CONTRAST TECHNIQUE: Multidetector CT imaging of the head and cervical spine was performed following the standard protocol without intravenous contrast. Multiplanar CT image reconstructions of the cervical spine were also generated. COMPARISON:  CT scan of January 17, 2016. FINDINGS: CT HEAD FINDINGS Brain: Mild chronic ischemic white matter disease is noted. No mass effect or midline shift is noted. Ventricular size is within normal limits. There is no evidence of mass lesion, hemorrhage or acute infarction. Vascular: Atherosclerosis of carotid siphons is noted. Skull: Normal. Negative for fracture or focal lesion. Sinuses/Orbits: No acute finding. Other: None. CT CERVICAL SPINE FINDINGS Alignment: Normal. Skull base and vertebrae: No acute fracture. No primary bone lesion or focal pathologic process. Soft tissues and spinal canal: Atherosclerotic calcifications of carotid arteries are noted. Disc levels: Moderate degenerative disc disease is noted at C3-4, C5-6 and C6-7 with anterior osteophyte formation. Upper chest: Negative. Other: None. IMPRESSION: Mild chronic ischemic white matter disease. No acute intracranial abnormality seen. Multilevel degenerative disc disease. No acute abnormality seen in the cervical spine. Electronically Signed   By: Lupita Raider, M.D.   On: 05/18/2016 20:49    Procedures .Marland KitchenLaceration Repair Date/Time: 05/18/2016 9:19 PM Performed by: Bethann Berkshire Authorized by: Bethann Berkshire   Comments:     Patient had a 1 cm laceration to occipital part of her head. Minimal bleeding. Air was cleaned thoroughly with Betadine and 2 staples were used to  close laceration. Patient tolerated procedure well   (including  critical care time)  Medications Ordered in ED Medications - No data to display   Initial Impression / Assessment and Plan / ED Course  I have reviewed the triage vital signs and the nursing notes.  Pertinent labs & imaging results that were available during my care of the patient were reviewed by me and considered in my medical decision making (see chart for details).     Patient with laceration to head from fall. CT scan negative. Patient will be discharged home and will use Tylenol for pain and have staples removed in one week  Final Clinical Impressions(s) / ED Diagnoses   Final diagnoses:  Fall, initial encounter    New Prescriptions New Prescriptions   No medications on file     Bethann Berkshire, MD 05/18/16 2119

## 2016-05-18 NOTE — Discharge Instructions (Signed)
Have staples removed by your doctor in 1 week. Take Tylenol for pain.

## 2016-05-18 NOTE — ED Triage Notes (Signed)
Per EMS pt was bringing food to neighbor and missed step up to house and fell backwards onto concrete on edge of AC unit. Patient has laceration to posterior scalped. Bleeding controlled. Denies LOC, dizziness, lightheadedness. Pt endorses neck and back pain. Equal grips. Patient c/o tenderness in C7-T1 and upper lumbar area. Patient c/o back pain all the way down back and headache.   Hx of HTN, DM,

## 2016-05-18 NOTE — ED Notes (Signed)
Patient transported to CT 

## 2016-05-18 NOTE — ED Notes (Signed)
Pt returned from CT °

## 2016-05-23 ENCOUNTER — Telehealth: Payer: Self-pay

## 2016-05-23 NOTE — Telephone Encounter (Signed)
Talked with patient about prolia injections, she has refused

## 2016-05-26 ENCOUNTER — Encounter (HOSPITAL_COMMUNITY): Payer: Self-pay

## 2016-05-26 ENCOUNTER — Ambulatory Visit (HOSPITAL_COMMUNITY)
Admission: EM | Admit: 2016-05-26 | Discharge: 2016-05-26 | Disposition: A | Discharge status: Home / Self Care | Payer: Medicare Other | Attending: Family Medicine | Admitting: Family Medicine

## 2016-05-26 DIAGNOSIS — Z4802 Encounter for removal of sutures: Secondary | ICD-10-CM

## 2016-05-26 NOTE — ED Provider Notes (Signed)
CSN: [id number]     Arrival date & time 05/26/16  1402 History   First MD Initiated Contact with Patient 05/26/16 1451     Chief Complaint  Patient presents with  . Suture / Staple Removal   (Consider location/radiation/quality/duration/timing/severity/associated sxs/prior Treatment) Patient is here for staple removal.  She had a laceration on her scalp a week ago.   The history is provided by the patient.  Suture / Staple Removal  This is a new problem. The current episode started more than 1 week ago. The problem occurs constantly. The problem has not changed since onset.Nothing aggravates the symptoms.    Past Medical History:  Diagnosis Date  . ABDOMINAL PAIN, LOWER 02/04/2010  . ABNORMAL THYROID FUNCTION TESTS 08/04/2009  . ALLERGIC RHINITIS 09/05/2006  . ANXIETY 07/31/2007  . Blood transfusion   . Cataract    resolved -surgery done  . DEPRESSION 09/05/2006  . DIABETES MELLITUS, TYPE II 07/31/2007   no meds  . Diabetic foot ulcer (HCC) 10/26/2014  . DIVERTICULOSIS, COLON 09/05/2006  . Dysuria 04/15/2010  . Failure to thrive in childhood 04/06/2010  . FATIGUE 04/15/2010  . FIBROMYALGIA 05/10/2009  . FRACTURE, PELVIS, RIGHT 04/15/2010  . GASTROENTERITIS, ACUTE 12/25/2008  . GERD 09/05/2006  . Heart murmur   . HYPERLIPIDEMIA 09/05/2006  . HYPERTENSION 09/05/2006  . KNEE PAIN, LEFT 02/04/2010  . MRSA infection 10/26/2014  . OSTEOARTHRITIS 09/05/2006  . OSTEOPOROSIS 07/31/2007  . Other specified forms of hearing loss 11/01/2009  . SINUSITIS- ACUTE-NOS 10/09/2007  . SKIN LESION 05/10/2009  . SPRAIN&STRAIN OTH SPEC SITES SHOULDER&UPPER ARM 11/01/2009  . Unspecified hearing loss 10/09/2007  . URI 02/20/2007  . UTI 02/20/2007   Past Surgical History:  Procedure Laterality Date  . ABDOMINAL HYSTERECTOMY  12/08   Bladder tac, partial hysterectomy  . BREAST BIOPSY    . catarct removal     both eyes  . I&D KNEE WITH POLY EXCHANGE Left 01/03/2013   Procedure: IRRIGATION AND DEBRIDEMENT LEFT KNEE WITH POLY  EXCHANGE;  Surgeon: Kathryne Hitch, MD;  Location: [facility];  Service: Orthopedics;  Laterality: Left;  . inguinal herniorrhapy     right Dr. Freida Busman 06/2009  . TONSILLECTOMY    . TOTAL KNEE ARTHROPLASTY Left 12/20/2012   Procedure: LEFT TOTAL KNEE ARTHROPLASTY;  Surgeon: Kathryne Hitch, MD;  Location: [facility];  Service: Orthopedics;  Laterality: Left;   Family History  Problem Relation Age of Onset  . Coronary artery disease Other   . Diabetes Other   . Colon cancer Neg Hx   . Esophageal cancer Neg Hx   . Stomach cancer Neg Hx   . Rectal cancer Neg Hx    Social History  Substance Use Topics  . Smoking status: Never Smoker  . Smokeless tobacco: Never Used  . Alcohol use No   OB History    No data available     Review of Systems  Constitutional: Negative.   HENT: Negative.   Eyes: Negative.   Respiratory: Negative.   Gastrointestinal: Negative.   Endocrine: Negative.   Genitourinary: Negative.   Musculoskeletal: Negative.   Skin: Positive for wound.  Allergic/Immunologic: Negative.   Neurological: Negative.   Hematological: Negative.   Psychiatric/Behavioral: Negative.     Allergies  Mellaril [thioridazine]; Penicillins; and Thioridazine hcl  Home Medications   Prior to Admission medications   Medication Sig Start Date End Date Taking? Authorizing Provider  albuterol (PROVENTIL HFA;VENTOLIN HFA) 108 (90 BASE) MCG/ACT inhaler Inhale 2 puffs into the lungs  every 6 (six) hours as needed for wheezing or shortness of breath. 06/26/14   Corwin Levins, MD  alendronate (FOSAMAX) 70 MG tablet TAKE 1 TABLET BY MOUTH EVERY 7 DAYS. TAKE WITH A FULL GLASS OF WATER ON AN EMPTY STOMACH. 07/26/15   Corwin Levins, MD  ALPRAZolam Prudy Feeler) 0.5 MG tablet Take 0.5 mg by mouth 2 (two) times daily as needed for anxiety.    Historical Provider, MD  amLODipine (NORVASC) 10 MG tablet Take 0.5 tablets (5 mg total) by mouth every morning. 11/23/14   Corwin Levins, MD  antiseptic oral  rinse (BIOTENE) LIQD 15 mLs by Mouth Rinse route daily as needed for dry mouth.    Historical Provider, MD  aspirin 81 MG tablet Take 1 tablet (81 mg total) by mouth daily. 09/25/15   Tyrone Nine, MD  atorvastatin (LIPITOR) 40 MG tablet Take 1 tablet (40 mg total) by mouth every morning. 02/18/15   Corwin Levins, MD  azelastine (ASTELIN) 0.1 % nasal spray Place 1 spray into both nostrils 2 (two) times daily. Use in each nostril as directed 11/23/14   Corwin Levins, MD  azithromycin (ZITHROMAX) 500 MG tablet Take 1 tablet (500 mg total) by mouth daily. 09/25/15   Tyrone Nine, MD  benztropine (COGENTIN) 0.5 MG tablet Take 0.5 mg by mouth 2 (two) times daily.      Historical Provider, MD  busPIRone (BUSPAR) 10 MG tablet Take 1 tablet (10 mg total) by mouth 3 (three) times daily. 12/11/13   Corwin Levins, MD  calcium-vitamin D (OSCAL WITH D) 500-200 MG-UNIT tablet Take 1 tablet by mouth 3 (three) times daily. 02/25/15   Corwin Levins, MD  cetirizine (ZYRTEC) 10 MG tablet TAKE 1 TABLET BY MOUTH DAILY 11/02/15   Corwin Levins, MD  diclofenac sodium (VOLTAREN) 1 % GEL Apply 2 g topically daily as needed.     Historical Provider, MD  Diclofenac Sodium 1.5 % SOLN Apply 20-40 drops up to 4 times a day 04/09/14   Historical Provider, MD  Flaxseed, Linseed, (FLAXSEED OIL) 1000 MG CAPS Take 1 capsule by mouth daily.    Historical Provider, MD  fluticasone [facility]) 50 MCG/ACT nasal spray USE 2 SPRAYS IN [facility] NOSTRIL EVERY DAY 07/26/15   Corwin Levins, MD  glucose blood [facility] VERIO) test strip 1 each by Other route 2 (two) times daily. Use to check blood sugars twice a day Dx E11.9    Historical Provider, MD  glucose blood (ONETOUCH VERIO) test strip 1 each by Other route 2 (two) times daily. Use to check blood sugar twice a day Dx E11.9 10/22/14   Corwin Levins, MD  hydrOXYzine (ATARAX/VISTARIL) 10 MG tablet Take 1 tablet (10 mg total) by mouth 2 (two) times daily as needed. Patient taking differently: Take 10 mg by  mouth 2 (two) times daily as needed for anxiety.  12/11/13   Corwin Levins, MD  ipratropium (ATROVENT) 0.06 % nasal spray USE 1 SPRAY IN [facility] NOSTRIL THREE TIMES DAILY 07/26/15   Corwin Levins, MD  Multiple Vitamin (MULTIVITAMIN WITH MINERALS) TABS tablet Take 1 tablet by mouth daily.    Historical Provider, MD  naproxen sodium (ANAPROX) 220 MG tablet Take 220-440 mg by mouth 2 (two) times daily as needed (for pain).    Historical Provider, MD  omega-3 acid ethyl esters (LOVAZA) 1 g capsule Take by mouth.    Historical Provider, MD  ONE TOUCH LANCETS MISC Use to  check blood sugars twice a day Dx E11.9 10/22/14   Corwin Levins, MD  [facility] DELICA LANCETS 33G MISC CHECK BLOOD SUGAR ONCE DAILY AS DIRECTED 06/23/15   Corwin Levins, MD  pantoprazole (PROTONIX) 40 MG tablet TAKE 1 TABLET BY MOUTH DAILY 11/16/15   Corwin Levins, MD  potassium chloride SA (K-DUR,KLOR-CON) 20 MEQ tablet Take 1 tablet (20 mEq total) by mouth 2 (two) times daily. 09/16/14   Corwin Levins, MD  pregabalin (LYRICA) 75 MG capsule Take 1 capsule (75 mg total) by mouth 2 (two) times daily. 01/09/13   Sharon Seller, NP  risperiDONE (RISPERDAL) 3 MG tablet Take 3 mg by mouth at bedtime.     Historical Provider, MD  sodium chloride (OCEAN) 0.65 % SOLN nasal spray Place 1-2 sprays into both nostrils daily as needed for congestion.    Historical Provider, MD  tiZANidine (ZANAFLEX) 4 MG tablet TAKE 1 TABLET BY MOUTH EVERY 8 HOURS AS NEEDED FOR MUSCLE SPASMS. 10/12/15   Corwin Levins, MD  vitamin C (ASCORBIC ACID) 500 MG tablet Take 500 mg by mouth daily.    Historical Provider, MD   Meds Ordered and Administered this Visit  Medications - No data to display  BP (!) 171/82 (BP Location: Right Arm)   Pulse 62   Temp 98.1 F (36.7 C) (Oral)   Resp 20   SpO2 100%  No data found.   Physical Exam  Constitutional: She appears well-developed and well-nourished.  HENT:  Head: Normocephalic and atraumatic.  Eyes: Conjunctivae and EOM are  normal. Pupils are equal, round, and reactive to light.  Neck: Normal range of motion. Neck supple.  Cardiovascular: Normal rate, regular rhythm and normal heart sounds.   Pulmonary/Chest: Effort normal and breath sounds normal.  Abdominal: Soft. Bowel sounds are normal.  Skin:  Laceration posterior scalp occipital region with 2 staples approximating lac well.  Nursing note and vitals reviewed.   Urgent Care Course     .Suture Removal Date/Time: 05/26/2016 2:54 PM Performed by: Deatra Canter Authorized by: Deatra Canter   Consent:    Consent obtained:  Verbal   Consent given by:  Patient   Risks discussed:  Bleeding   Alternatives discussed:  No treatment Location:    Location:  Head/neck   Head/neck location:  Scalp Procedure details:    Wound appearance:  No signs of infection, good wound healing and clean   Number of staples removed:  2 Post-procedure details:    Post-removal:  No dressing applied   Patient tolerance of procedure:  Tolerated well, no immediate complications   (including critical care time)  Labs Review Labs Reviewed - No data to display  Imaging Review No results found.   Visual Acuity Review  Right Eye Distance:   Left Eye Distance:   Bilateral Distance:    Right Eye Near:   Left Eye Near:    Bilateral Near:         MDM   1. Removal of staples    2 staples removed    Deatra Canter, FNP 05/26/16 1455    Deatra Canter, FNP 05/26/16 1455

## 2016-05-26 NOTE — ED Triage Notes (Signed)
Pt here for staple removal in the back of her head. Said no complaints today.

## 2016-05-29 ENCOUNTER — Telehealth (INDEPENDENT_AMBULATORY_CARE_PROVIDER_SITE_OTHER): Payer: Self-pay | Admitting: Orthopaedic Surgery

## 2016-05-29 NOTE — Telephone Encounter (Signed)
Returned call to patient left message to call back  [phone].

## 2016-06-05 ENCOUNTER — Encounter (INDEPENDENT_AMBULATORY_CARE_PROVIDER_SITE_OTHER): Payer: Self-pay | Admitting: Physician Assistant

## 2016-06-05 ENCOUNTER — Ambulatory Visit (INDEPENDENT_AMBULATORY_CARE_PROVIDER_SITE_OTHER): Payer: Medicare Other

## 2016-06-05 ENCOUNTER — Ambulatory Visit (INDEPENDENT_AMBULATORY_CARE_PROVIDER_SITE_OTHER): Payer: Medicare Other | Admitting: Physician Assistant

## 2016-06-05 DIAGNOSIS — M546 Pain in thoracic spine: Secondary | ICD-10-CM | POA: Diagnosis not present

## 2016-06-05 NOTE — Progress Notes (Signed)
Office Visit Note   Patient: Katherine [name]           Date of Birth: 12/09/1945           MRN: [id number] Visit Date: 06/05/2016              Requested by: [name], [name], MD [address] [place] [id number] PCP: System, Provider Not In   Assessment & Plan: Visit Diagnoses:  1. Pain in thoracic spine     Plan: Offered patient physical therapy for her thoracic spine pain. She defers. She'll return if symptoms become worse or continue.  Follow-Up Instructions: Return if symptoms worsen or fail to improve.   Orders:  Orders Placed This Encounter  Procedures  . XR Thoracic Spine 2 View   No orders of the defined types were placed in this encounter.     Procedures: No procedures performed   Clinical Data: No additional findings.   Subjective: Chief Complaint  Patient presents with  . Right Shoulder - Pain  . Neck - Pain  . Lower Back - Pain    HPI Katherine [name] returns today with a new complaint of mid back pain. She had a fall on going up one step on 05/18/2016 at her neighbor's home. Fell backwards. She was evaluated in the ER where CT scans of her neck and head were performed. She actually sustained laceration to her scalp without staples. CT scan of the cervical spine showed no acute findings. She is now having pain in her mid back. She's having no chest pain or shortness of breath. Taken tramadol for the pain. Review of Systems  See history of present illness Objective: Vital Signs: There were no vitals taken for this visit.  Physical Exam  Constitutional: She is oriented to person, place, and time. She appears well-developed and well-nourished. No distress.  Pulmonary/Chest: Effort normal.  Neurological: She is alert and oriented to person, place, and time.    Ortho Exam Upper extremities 5 out of 5 strength throughout the upper extremities. Radial pulses are 2+. Full motor bilateral hands. She has diminished sensation in her left hand  involving the thumb index middle finger. Tenderness over the mid thoracic spine. Specialty Comments:  No specialty comments available.  Imaging: Xr Thoracic Spine 2 View  Result Date: 06/05/2016 AP lateral views of the thoracic spine: Shows no acute fractures no acute findings. Anterior wedging the lower thoracic vertebral body unchanged from previous films. No gross rib fractures on the AP view.    PMFS History: Patient Active Problem List   Diagnosis Date Noted  . CAP (community acquired pneumonia) 09/23/2015  . Sepsis (HCC) 09/23/2015  . Hypernatremia 09/23/2015  . Dehydration 09/23/2015  . MRSA infection 10/26/2014  . Diabetic foot ulcer (HCC) 10/26/2014  . Pre-ulcerative corn or callous 10/09/2013  . UTI (urinary tract infection) 05/09/2013  . Hypokalemia 05/09/2013  . Altered mental status 04/29/2013  . Normocytic anemia 01/06/2013  . 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 Thyroid nodule 11/07/2012  . Aortic stenosis, mild 05/27/2012  . Heart murmur, systolic 05/09/2012  . Encounter for preventative adult health care exam with abnormal findings 10/07/2010  . FATIGUE 04/15/2010  . Dysuria 04/15/2010  . FRACTURE, PELVIS, RIGHT 04/15/2010  . Failure to thrive in childhood 04/06/2010  . SPRAIN&STRAIN OTH SPEC SITES SHOULDER&UPPER ARM 11/01/2009  . ABNORMAL THYROID FUNCTION TESTS 08/04/2009  . SKIN LESION 05/10/2009  . Fibromyalgia 05/10/2009  . Anxiety state 07/31/2007  .  Osteoporosis 07/31/2007  . Hyperlipidemia 09/05/2006  . DEPRESSION 09/05/2006  . Essential hypertension 09/05/2006  . Allergic rhinitis 09/05/2006  . GERD 09/05/2006  . DIVERTICULOSIS, COLON 09/05/2006  . OSTEOARTHRITIS 09/05/2006   Past Medical History:  Diagnosis Date  . ABDOMINAL PAIN, LOWER 02/04/2010  . ABNORMAL THYROID FUNCTION TESTS 08/04/2009  . ALLERGIC RHINITIS 09/05/2006  . ANXIETY 07/31/2007  . Blood transfusion   . Cataract    resolved -surgery done  . DEPRESSION  09/05/2006  . DIABETES MELLITUS, TYPE II 07/31/2007   no meds  . Diabetic foot ulcer (HCC) 10/26/2014  . DIVERTICULOSIS, COLON 09/05/2006  . Dysuria 04/15/2010  . Failure to thrive in childhood 04/06/2010  . FATIGUE 04/15/2010  . FIBROMYALGIA 05/10/2009  . FRACTURE, PELVIS, RIGHT 04/15/2010  . GASTROENTERITIS, ACUTE 12/25/2008  . GERD 09/05/2006  . Heart murmur   . HYPERLIPIDEMIA 09/05/2006  . HYPERTENSION 09/05/2006  . KNEE PAIN, LEFT 02/04/2010  . MRSA infection 10/26/2014  . OSTEOARTHRITIS 09/05/2006  . OSTEOPOROSIS 07/31/2007  . Other specified forms of hearing loss 11/01/2009  . SINUSITIS- ACUTE-NOS 10/09/2007  . SKIN LESION 05/10/2009  . SPRAIN&STRAIN OTH SPEC SITES SHOULDER&UPPER ARM 11/01/2009  . Unspecified hearing loss 10/09/2007  . URI 02/20/2007  . UTI 02/20/2007    Family History  Problem Relation Age of Onset  . Coronary artery disease Other   . Diabetes Other   . Colon cancer Neg Hx   . Esophageal cancer Neg Hx   . Stomach cancer Neg Hx   . Rectal cancer Neg Hx     Past Surgical History:  Procedure Laterality Date  . ABDOMINAL HYSTERECTOMY  12/08   Bladder tac, partial hysterectomy  . BREAST BIOPSY    . catarct removal     both eyes  . I&D KNEE WITH POLY EXCHANGE Left 01/03/2013   Procedure: IRRIGATION AND DEBRIDEMENT LEFT KNEE WITH POLY EXCHANGE;  Surgeon: Kathryne Hitch, MD;  Location: [facility];  Service: Orthopedics;  Laterality: Left;  . inguinal herniorrhapy     right Dr. Freida Busman 06/2009  . TONSILLECTOMY    . TOTAL KNEE ARTHROPLASTY Left 12/20/2012   Procedure: LEFT TOTAL KNEE ARTHROPLASTY;  Surgeon: Kathryne Hitch, MD;  Location: [facility];  Service: Orthopedics;  Laterality: Left;   Social History   Occupational History  . retired [employer]   Social History Main Topics  . Smoking status: Never Smoker  . Smokeless tobacco: Never Used  . Alcohol use No  . Drug use: No  . Sexual activity: Not on file

## 2016-06-20 ENCOUNTER — Ambulatory Visit (INDEPENDENT_AMBULATORY_CARE_PROVIDER_SITE_OTHER): Payer: Medicare Other | Admitting: Orthopaedic Surgery

## 2016-06-28 ENCOUNTER — Telehealth (INDEPENDENT_AMBULATORY_CARE_PROVIDER_SITE_OTHER): Payer: Self-pay

## 2016-06-28 ENCOUNTER — Ambulatory Visit (INDEPENDENT_AMBULATORY_CARE_PROVIDER_SITE_OTHER): Payer: Medicare Other | Admitting: Orthopaedic Surgery

## 2016-06-28 MED ORDER — HYDROCODONE-ACETAMINOPHEN 5-325 MG PO TABS: 1.0000 | ORAL_TABLET | Freq: Four times a day (QID) | ORAL | 0 refills | Status: DC | PRN

## 2016-06-28 NOTE — Telephone Encounter (Signed)
Patient stated that she needs another Rx for pain.  Stated that the Tramadol is not working and she is in a lot of pain.  CB# is [phone]

## 2016-06-28 NOTE — Telephone Encounter (Signed)
I left voicemail for patient advising script at front for pick up.

## 2016-06-28 NOTE — Telephone Encounter (Signed)
norco 5/325 #[phone] zero one po  q6

## 2016-06-28 NOTE — Telephone Encounter (Signed)
Please advise 

## 2016-06-28 NOTE — Addendum Note (Signed)
Addended by: Rogers SeedsYEATTS, Chrishaun Sasso M on: 06/28/2016 05:15 PM   Modules accepted: Orders

## 2016-06-29 ENCOUNTER — Telehealth (INDEPENDENT_AMBULATORY_CARE_PROVIDER_SITE_OTHER): Payer: Self-pay | Admitting: Orthopaedic Surgery

## 2016-06-29 NOTE — Telephone Encounter (Signed)
She wants us to cancel this, she states she can't drive so she will just get this at her PCP tomorrow

## 2016-06-29 NOTE — Telephone Encounter (Signed)
Patient called asked if she can get something else for pain other than Tramadol. Patient said the Tramadol did not work. The number to contact patient is [phone]

## 2016-07-06 ENCOUNTER — Encounter (INDEPENDENT_AMBULATORY_CARE_PROVIDER_SITE_OTHER): Payer: Self-pay | Admitting: Physician Assistant

## 2016-07-06 ENCOUNTER — Ambulatory Visit (INDEPENDENT_AMBULATORY_CARE_PROVIDER_SITE_OTHER): Payer: Medicare Other

## 2016-07-06 ENCOUNTER — Ambulatory Visit (INDEPENDENT_AMBULATORY_CARE_PROVIDER_SITE_OTHER): Payer: Medicare Other | Admitting: Physician Assistant

## 2016-07-06 DIAGNOSIS — G8929 Other chronic pain: Secondary | ICD-10-CM | POA: Diagnosis not present

## 2016-07-06 DIAGNOSIS — M25511 Pain in right shoulder: Secondary | ICD-10-CM | POA: Diagnosis not present

## 2016-07-06 DIAGNOSIS — M546 Pain in thoracic spine: Secondary | ICD-10-CM

## 2016-07-06 NOTE — Progress Notes (Signed)
Office Visit Note   Patient: Katherine [name]           Date of Birth: 06/06/1945           MRN: [id number] Visit Date: 07/06/2016              Requested by: No referring provider defined for this encounter. PCP: System, Provider Not In   Assessment & Plan: Visit Diagnoses:  1. Chronic right shoulder pain   2. Pain in thoracic spine     Plan: We will refer her to Dr. Ave Filterhandler for possible reverse right shoulder replacement . She'll continue her tramadol for neck and thoracic pain. She will follow with us on an as-needed basis.  Follow-Up Instructions: Return if symptoms worsen or fail to improve.   Orders:  Orders Placed This Encounter  Procedures  . XR Shoulder Right   No orders of the defined types were placed in this encounter.     Procedures: No procedures performed   Clinical Data: No additional findings.   Subjective: Chief Complaint  Patient presents with  . Right Shoulder - Pain    HPI  Katherine [name] returns today follow-up of her thoracic pain which she states is better with these tramadol. She is also here for right shoulder pain today. States she has limited range of motion of the right shoulder and pain in the shoulder. Has occasional tingling in her left hand but this is the other than it was. Again she had a fall back in April this year underwent a CT scan of her cervical spine which showed no acute findings. Thoracic spine films here in the office showed no acute findings.  Review of Systems   Objective: Vital Signs: There were no vitals taken for this visit.  Physical Exam  Constitutional: She is oriented to person, place, and time. She appears well-developed and well-nourished. No distress.  Neurological: She is alert and oriented to person, place, and time.  Skin: She is not diaphoretic.  Psychiatric: She has a normal mood and affect.    Ortho Exam Bilateral shoulders right shoulder 3 out of 5 strength with external rotation 5 out of 5  strength with internal rotation. Left shoulder 5 out of 5 strength with internal rotation and 3 out of 5 with external rotation. Left shoulder very limited external rotation both actively and passively. Right shoulder forward flexion 40 actively 110 passively left shoulder 180 passively and actively with forward flexion. She has significant crepitus with passive range of motion of both shoulders with internal/external rotation. Specialty Comments:  No specialty comments available.  Imaging: Xr Shoulder Right  Result Date: 07/06/2016 3 views right shoulder shows  severe shoulder arthropathy. The humeral head appears well maintained. The glenohumeral joint is well maintained. No acute fractures. Moderate acromioclavicular joint arthritis    PMFS History: Patient Active Problem List   Diagnosis Date Noted  . CAP (community acquired pneumonia) 09/23/2015  . Sepsis (HCC) 09/23/2015  . Hypernatremia 09/23/2015  . Dehydration 09/23/2015  . MRSA infection 10/26/2014  . Diabetic foot ulcer (HCC) 10/26/2014  . Pre-ulcerative corn or callous 10/09/2013  . UTI (urinary tract infection) 05/09/2013  . Hypokalemia 05/09/2013  . Altered mental status 04/29/2013  . Normocytic anemia 01/06/2013  . 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 Thyroid nodule 11/07/2012  . Aortic stenosis, mild 05/27/2012  . Heart murmur, systolic 05/09/2012  . Encounter for preventative adult health care exam with abnormal findings 10/07/2010  . FATIGUE 04/15/2010  .  Dysuria 04/15/2010  . FRACTURE, PELVIS, RIGHT 04/15/2010  . Failure to thrive in childhood 04/06/2010  . SPRAIN&STRAIN OTH SPEC SITES SHOULDER&UPPER ARM 11/01/2009  . ABNORMAL THYROID FUNCTION TESTS 08/04/2009  . SKIN LESION 05/10/2009  . Fibromyalgia 05/10/2009  . Anxiety state 07/31/2007  . Osteoporosis 07/31/2007  . Hyperlipidemia 09/05/2006  . DEPRESSION 09/05/2006  . Essential hypertension 09/05/2006  . Allergic rhinitis  09/05/2006  . GERD 09/05/2006  . DIVERTICULOSIS, COLON 09/05/2006  . OSTEOARTHRITIS 09/05/2006   Past Medical History:  Diagnosis Date  . ABDOMINAL PAIN, LOWER 02/04/2010  . ABNORMAL THYROID FUNCTION TESTS 08/04/2009  . ALLERGIC RHINITIS 09/05/2006  . ANXIETY 07/31/2007  . Blood transfusion   . Cataract    resolved -surgery done  . DEPRESSION 09/05/2006  . DIABETES MELLITUS, TYPE II 07/31/2007   no meds  . Diabetic foot ulcer (HCC) 10/26/2014  . DIVERTICULOSIS, COLON 09/05/2006  . Dysuria 04/15/2010  . Failure to thrive in childhood 04/06/2010  . FATIGUE 04/15/2010  . FIBROMYALGIA 05/10/2009  . FRACTURE, PELVIS, RIGHT 04/15/2010  . GASTROENTERITIS, ACUTE 12/25/2008  . GERD 09/05/2006  . Heart murmur   . HYPERLIPIDEMIA 09/05/2006  . HYPERTENSION 09/05/2006  . KNEE PAIN, LEFT 02/04/2010  . MRSA infection 10/26/2014  . OSTEOARTHRITIS 09/05/2006  . OSTEOPOROSIS 07/31/2007  . Other specified forms of hearing loss 11/01/2009  . SINUSITIS- ACUTE-NOS 10/09/2007  . SKIN LESION 05/10/2009  . SPRAIN&STRAIN OTH SPEC SITES SHOULDER&UPPER ARM 11/01/2009  . Unspecified hearing loss 10/09/2007  . URI 02/20/2007  . UTI 02/20/2007    Family History  Problem Relation Age of Onset  . Coronary artery disease Other   . Diabetes Other   . Colon cancer Neg Hx   . Esophageal cancer Neg Hx   . Stomach cancer Neg Hx   . Rectal cancer Neg Hx     Past Surgical History:  Procedure Laterality Date  . ABDOMINAL HYSTERECTOMY  12/08   Bladder tac, partial hysterectomy  . BREAST BIOPSY    . catarct removal     both eyes  . I&D KNEE WITH POLY EXCHANGE Left 01/03/2013   Procedure: IRRIGATION AND DEBRIDEMENT LEFT KNEE WITH POLY EXCHANGE;  Surgeon: Kathryne Hitch, MD;  Location: [facility];  Service: Orthopedics;  Laterality: Left;  . inguinal herniorrhapy     right Dr. Freida Busman 06/2009  . TONSILLECTOMY    . TOTAL KNEE ARTHROPLASTY Left 12/20/2012   Procedure: LEFT TOTAL KNEE ARTHROPLASTY;  Surgeon: Kathryne Hitch, MD;   Location: [facility];  Service: Orthopedics;  Laterality: Left;   Social History   Occupational History  . retired [employer]   Social History Main Topics  . Smoking status: Never Smoker  . Smokeless tobacco: Never Used  . Alcohol use No  . Drug use: No  . Sexual activity: Not on file

## 2016-07-07 ENCOUNTER — Other Ambulatory Visit (INDEPENDENT_AMBULATORY_CARE_PROVIDER_SITE_OTHER): Payer: Self-pay

## 2016-07-07 DIAGNOSIS — M25511 Pain in right shoulder: Secondary | ICD-10-CM

## 2016-08-31 NOTE — Pre-Procedure Instructions (Addendum)
Katherine [name]  08/31/2016      [facility] [place] [phone] [phone] [address] [place] [address] Phone: [phone] Fax: [phone]  [id number] [place] - [address] AT [employer] [address] [place] [id number] Phone: [phone] Fax: [phone]    Your procedure is scheduled on August 9  [facility] Admitting at 0530 A.M.  Call this number if you have problems the morning of surgery:  [phone]   Remember:  Do not eat food or drink liquids after midnight.  Continue all other medications as directed by your physician except follow these instructions about you medications    Take these medicines the morning of surgery with A SIP OF WATER benztropine (COGENTIN), cetirizine (ZYRTEC),  gabapentin (NEURONTIN), hydrOXYzine (ATARAX/VISTARIL), ipratropium (ATROVENT) , pantoprazole (PROTONIX), traMADol (ULTRAM)   7 days prior to surgery STOP taking any celecoxib (CELEBREX),  Aleve, Naproxen, Ibuprofen, Motrin, Advil, Goody's, BC's, all herbal medications, fish oil, and all vitamins  Follow your doctors instructions regarding your Aspirin.  If no instructions were given by the doctor you will need to call the office to get instructions.  Your pre admission RN will also call for those instructions  WHAT DO I DO ABOUT MY DIABETES MEDICATION?   [name]. Do not take oral diabetes medicines (pills) the morning of surgery. metFORMIN (GLUCOPHAGE)  How to Manage Your Diabetes Before and After Surgery  Why is it important to control my blood sugar before and after surgery? . Improving blood sugar levels before and after surgery helps healing and can limit problems. . A way of improving blood sugar control is eating a healthy diet by: o  Eating less sugar and carbohydrates o  Increasing activity/exercise o  Talking with your doctor about reaching your  blood sugar goals . High blood sugars (greater than 180 mg/dL) can raise your risk of infections and slow your recovery, so you will need to focus on controlling your diabetes during the weeks before surgery. . Make sure that the doctor who takes care of your diabetes knows about your planned surgery including the date and location.  How do I manage my blood sugar before surgery? . Check your blood sugar at least 4 times a day, starting 2 days before surgery, to make sure that the level is not too high or low. o Check your blood sugar the morning of your surgery when you wake up and every 2 hours until you get to the Short Stay unit. . If your blood sugar is less than 70 mg/dL, you will need to treat for low blood sugar: o Do not take insulin. o Treat a low blood sugar (less than 70 mg/dL) with  cup of clear juice (cranberry or apple), 4 glucose tablets, OR glucose gel. o Recheck blood sugar in 15 minutes after treatment (to make sure it is greater than 70 mg/dL). If your blood sugar is not greater than 70 mg/dL on recheck, call [phone] for further instructions. . Report your blood sugar to the short stay nurse when you get to Short Stay.  . If you are admitted to the hospital after surgery: o Your blood sugar will be checked by the staff and you will probably be given insulin after surgery (instead of oral diabetes medicines) to make sure you have good blood sugar levels. o The goal for blood sugar control  after surgery is 80-180 mg/dL.   Do not wear jewelry, make-up or nail polish.  Do not wear lotions, powders, or perfumes, or deoderant.  Do not shave 48 hours prior to surgery.  Men may shave face and neck.  Do not bring valuables to the hospital.  [facility] is not responsible for any belongings or valuables.  Contacts, dentures or bridgework may not be worn into surgery.  Leave your suitcase in the car.  After surgery it may be brought to your room.  For patients admitted to the  hospital, discharge time will be determined by your treatment team.  Patients discharged the day of surgery will not be allowed to drive home.    Special instructions:   [place]- Preparing For Surgery  Before surgery, you can play an important role. Because skin is not sterile, your skin needs to be as free of germs as possible. You can reduce the number of germs on your skin by washing with CHG (chlorahexidine gluconate) Soap before surgery.  CHG is an antiseptic cleaner which kills germs and bonds with the skin to continue killing germs even after washing.  Please do not use if you have an allergy to CHG or antibacterial soaps. If your skin becomes reddened/irritated stop using the CHG.  Do not shave (including legs and underarms) for at least 48 hours prior to first CHG shower. It is OK to shave your face.  Please follow these instructions carefully.   1. Shower the NIGHT BEFORE SURGERY and the MORNING OF SURGERY with CHG.   2. If you chose to wash your hair, wash your hair first as usual with your normal shampoo.  3. After you shampoo, rinse your hair and body thoroughly to remove the shampoo.  4. Use CHG as you would any other liquid soap. You can apply CHG directly to the skin and wash gently with a scrungie or a clean washcloth.   5. Apply the CHG Soap to your body ONLY FROM THE NECK DOWN.  Do not use on open wounds or open sores. Avoid contact with your eyes, ears, mouth and genitals (private parts). Wash genitals (private parts) with your normal soap.  6. Wash thoroughly, paying special attention to the area where your surgery will be performed.  7. Thoroughly rinse your body with warm water from the neck down.  8. DO NOT shower/wash with your normal soap after using and rinsing off the CHG Soap.  9. Pat yourself dry with a CLEAN TOWEL.   10. Wear CLEAN PAJAMAS   11. Place CLEAN SHEETS on your bed the night of your first shower and DO NOT SLEEP WITH PETS.    Day of  Surgery: Do not apply any deodorants/lotions. Please wear clean clothes to the hospital/surgery center.      Please read over the following fact sheets that you were given.

## 2016-09-01 ENCOUNTER — Encounter (HOSPITAL_COMMUNITY)
Admission: RE | Admit: 2016-09-01 | Discharge: 2016-09-01 | Disposition: A | Discharge status: Home / Self Care | Payer: Medicare Other | Source: Ambulatory Visit | Attending: Orthopedic Surgery | Admitting: Orthopedic Surgery

## 2016-09-01 ENCOUNTER — Encounter (HOSPITAL_COMMUNITY): Payer: Self-pay

## 2016-09-01 DIAGNOSIS — Z01812 Encounter for preprocedural laboratory examination: Secondary | ICD-10-CM | POA: Diagnosis present

## 2016-09-01 HISTORY — DX: Nonrheumatic aortic (valve) stenosis: I35.0

## 2016-09-01 HISTORY — DX: Cardiac arrhythmia, unspecified: I49.9

## 2016-09-01 LAB — SURGICAL PCR SCREEN
MRSA, PCR: NEGATIVE
STAPHYLOCOCCUS AUREUS: NEGATIVE

## 2016-09-01 LAB — CBC
HCT: 36.8 % (ref 36.0–46.0)
Hemoglobin: 12.2 g/dL (ref 12.0–15.0)
MCH: 30 pg (ref 26.0–34.0)
MCHC: 33.2 g/dL (ref 30.0–36.0)
MCV: 90.6 fL (ref 78.0–100.0)
PLATELETS: 336 10*3/uL (ref 150–400)
RBC: 4.06 MIL/uL (ref 3.87–5.11)
RDW: 13.4 % (ref 11.5–15.5)
WBC: 6.2 10*3/uL (ref 4.0–10.5)

## 2016-09-01 LAB — GLUCOSE, CAPILLARY: Glucose-Capillary: 107 mg/dL — ABNORMAL HIGH (ref 65–99)

## 2016-09-01 LAB — BASIC METABOLIC PANEL
Anion gap: 9 (ref 5–15)
BUN: 18 mg/dL (ref 6–20)
CALCIUM: 9.5 mg/dL (ref 8.9–10.3)
CO2: 24 mmol/L (ref 22–32)
CREATININE: 0.66 mg/dL (ref 0.44–1.00)
Chloride: 104 mmol/L (ref 101–111)
GFR calc Af Amer: >60 mL/min (ref >60)
Glucose, Bld: 99 mg/dL (ref 65–99)
POTASSIUM: 4.7 mmol/L (ref 3.5–5.1)
SODIUM: 137 mmol/L (ref 135–145)

## 2016-09-01 NOTE — Progress Notes (Signed)
Dr. Selina Cooleyhandlers PA Duwayne Heckanielle called and patient is ok to stop Aspirin 5 days - patient called and informed

## 2016-09-01 NOTE — Progress Notes (Signed)
PCP - Roxanne MinsMichael Duran Cardiologist - not sure if she sees one said to ask PCP  Chest x-ray - 09/23/15 EKG - 09/22/16 Stress Test - requesting ECHO - 09/29/14 Cardiac Cath - denies    Fasting Blood Sugar - 80-90s Checks Blood Sugar ___2__ times a day   Patient is a poor historian of her medical history. She could not verify if she has seen a cardiologist.  She told me to ask her PCP because he takes good care of her.  I am [facility]  No orders from Dr. Ave Filterhandler called office for those instructions as well as Aspirin instructions   Patient denies shortness of breath, fever, cough and chest pain at PAT appointment   Patient verbalized understanding of instructions that were given to them at the PAT appointment. Patient was also instructed that they will need to review over the PAT instructions again at home before surgery.

## 2016-09-02 LAB — HEMOGLOBIN A1C
Hgb A1c MFr Bld: 5.6 % (ref 4.8–5.6)
MEAN PLASMA GLUCOSE: 114 mg/dL

## 2016-09-04 ENCOUNTER — Encounter (HOSPITAL_COMMUNITY): Payer: Self-pay

## 2016-09-04 NOTE — Progress Notes (Addendum)
error 

## 2016-09-06 ENCOUNTER — Encounter (HOSPITAL_COMMUNITY): Payer: Self-pay

## 2016-09-06 NOTE — Progress Notes (Signed)
Anesthesia Chart Review: Patient is a 71 year old female scheduled for reverse shoulder arthroplasty, right on 09/07/16 by Dr. Ave Filterhandler.   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   PCP is Roxanne MinsMichael Duran, PA-C with [facility]. (He works at both the IM and cardiology clinic there. Dr. Lollie Marrowaymond Rosario is [facility]ologist at [facility].)    Meds include aspirin 81 mg, Cogentin, Zyrtec, Neurontin, Atrovent, metformin, Protonix, KCl, Lovaza, pravastatin, risperidone, tramadol. Per Dr. Veda Canninghandler's PA, okay to hold ASA for 5 days prior to surgery. (Update 09/06/16 1:40 PM: Patient instructed to hold Cogentin on the morning of surgery.)  BP 138/87   Pulse 69   Temp (!) 36.4 C (Oral)   Resp 20   Ht 5\' 2"  (1.575 m)   Wt 136 lb 11.2 oz (62 kg)   SpO2 99%   BMI 25.00 kg/m   [facility]): NSR, LAD, cannot rule out anterior infarct (age undetermined). High lateral ST abnormality is improved when compared to 09/23/15 tracing. Inferior T wave inversion is less prominent as well.   Nuclear stress test 08/24/16 [facility]): Impressions: The resting ECG shows normal sinus rhythm. The resting stress ECG shows normal ST segment; no ventricular tachycardia, significant QRS prolongation or heart block. Both the rest and stress images are within normal limits. No significant reversible ischemia or fixed scar. Gated left ventricular ejection fraction is normal at 66%. Normal LV segmental wall motion.   Echo 10/29/15 [facility]):  Conclusions: LV size, wall thickness and systolic function normal. Overall left ventricular systolic function is normal with EF between 60 and 65%. Diastolic filling pattern is normal for age. Left atrium is mildly dilated. Right ventricle is normal in size and function. Right atrium is normal in size and function. Aortic valve is trileaflet and moderately thickened. Severe stenosis by calculated valve area (moderate by gradient and valve anatomy/motion) with peak/mean pressure gradient of 38.41 mmHg/21.40 mmHg, aortic valve area by continuity equation is 0.8 cm. No mitral regurgitation. The tricuspid valve appears structurally normal. Mild tricuspid regurgitation. Mild pulmonary hypertension. RVSP as measured by Dopplers 38.36 mmHg. No pericardial effusion. The aortic root, ascending aorta, and aortic arch are normal. (Comparison echo 09/29/14: Mild AS. VTI ratio of LVOT to aortic valve: 0.4. Mean velocity ratio of LVOT to aortic valve: 0.37.    Mean gradient (S): 15 mm Hg.)  Preoperative labs noted. CBC, BMET WNL. A1c 5.6.   I spoke with nurse Neysa Bonitohristy at [facility]. She says there is a more recent echo from 05/2016 (although not mentioned in notes) that she will fax for review. If AS is still moderate-severe, I will discuss with one of our anesthesiologists as they may want Dr. Hanley Haysosario to weigh in prior to surgery to clarify AS management plan. Arnette FeltsMike Duran is out of the office this week, but Dr. Hanley Haysosario is available if we need further clarification.  Velna Ochsllison Kristen Fromm, PA-C [facility] Short Stay Center/Anesthesiology Phone [phone] 09/04/2016 5:40 PM  Addendum: May echo signed this morning by Dr. Hanley Haysosario. Patient has moderate-severe AS as before, although velocities slightly better (see below).   Echo 06/14/16: Conclusions: LV size, wall thickness and  systolic function are normal. Overall left ventricular systolic function is normal with an EF between 55 and 60%. Left atrium is mildly  dilated. Right ventricle is normal in size and function. The right atrium is normal in size and function. Aortic valve is trileaflet and is moderately thickened. Trace amount of aortic regurgitation. Moderate to severe aortic stenosis with peak/mean pressure gradient of 26.46 mmHg/16.31 mmHg, the aortic valve area by continuity equation is 0.9 cm. There is trace mitral regurgitation. The tricuspid valve appears structurally normal. Mild tricuspid regurgitation. Mild pulmonary hypertension. RVSP 38.95 mmHg. There is no pericardial effusion. The aortic root, ascending aorta, and aortic arch are normal.  Since echo report did not include any specific comments from Dr. Hanley Hays regarding on-going management of AS, I called [facility] for additional input. I spoke with Judeth Cornfield who reported that prior to faxing echo report she asked him about any additional preoperative recommendations. In summary, she reported that since he had not recently examined patient, he would only comment that based on echo results he would not anticipate need for further testing prior to surgery as long as patient was asymptomatic (ie, no syncope/presyncope, chest pain, SOB). I called and spoke with patient. She denied chest pain, syncope, dizziness. She says she is "snuffy" at times and that she has dyspnea on exertion, but no recent changes in her symptoms. She describes herself as "active", although I had a hard time getting specifics. She says that she does "arm and leg exercises" and "sit-ups" at home. She uses a cane or walker. She mainly sleeps on her side. She denied orthopnea/PND.   I reviewed above including what input I had received from [facility] with anesthesiologist Dr. Michelle Piper. Echo from three months ago showed stable moderate-severe AS. She denied any recent changes in her SOB and denied any chest pain or syncope/presyncope. She had recent non-ischemic stress test. Based on current information, it is  anticipated that she can proceed as planned; however, she ultimately will need to be evaluated by her assigned anesthesiologist. If any new symptoms come to light or there are unexpected exam findings then it is possible that surgery could be delayed or cancelled. I have notified Carla at Dr. Veda Canning office.   Velna Ochs [facility] Short Stay Center/Anesthesiology Phone [phone] 09/06/2016 5:34 PM

## 2016-09-06 NOTE — Anesthesia Preprocedure Evaluation (Addendum)
Anesthesia Evaluation  Patient identified by MRN, date of birth, ID band Patient awake    Reviewed: Allergy & Precautions, H&P , NPO status , Patient's Chart, lab work & pertinent test results  Airway Mallampati: III  TM Distance: >3 FB Neck ROM: full    Dental  (+) Teeth Intact, Dental Advisory Given, Chipped,    Pulmonary neg pulmonary ROS,    Pulmonary exam normal        Cardiovascular Exercise Tolerance: Good hypertension, Pt. on medications + Valvular Problems/Murmurs AS  Rhythm:regular Rate:Normal + Systolic murmurs- Diastolic murmurs ECG: ST, rate 103  ECHO: When compared to the prior study from 05/27/2012 there is no significant difference, aortic stenosis is still mild. LV EF: 60% -   65%  I reviewed above including what input I had received from [facility] with anesthesiologist Dr. Michelle Piperssey. Echo from three months ago showed stable moderate-severe AS. She denied any recent changes in her SOB and denied any chest pain or syncope/presyncope. She had recent non-ischemic stress test. Based on current information, it is anticipated that she can proceed as planned  Nuclear stress test 08/24/16 [facility]): Impressions: The resting ECG shows normal sinus rhythm. The resting stress ECG shows normal ST segment; no ventricular tachycardia, significant QRS prolongation or heart block. Both the rest and stress images are within normal limits. No significant reversible ischemia or fixed scar. Gated left ventricular ejection fraction is normal at 66%. Normal LV segmental wall motion.    Neuro/Psych Anxiety Depression negative neurological ROS     GI/Hepatic Neg liver ROS, GERD  Medicated,  Endo/Other  diabetes, Well Controlled, Type 2, Oral Hypoglycemic AgentsDiet controlled DM  Renal/GU negative Renal ROS     Musculoskeletal  (+) Arthritis , Osteoarthritis,  Fibromyalgia -  Abdominal   Peds   Hematology negative hematology ROS (+) anemia , hgb 10.1   Anesthesia Other Findings HYPERLIPIDEMIA  Reproductive/Obstetrics                           Anesthesia Physical  Anesthesia Plan  ASA: III  Anesthesia Plan: General and Regional   Post-op Pain Management: GA combined w/ Regional for post-op pain   Induction: Intravenous  PONV Risk Score and Plan: 3 and Ondansetron, Dexamethasone and Midazolam  Airway Management Planned: Oral ETT  Additional Equipment:   Intra-op Plan:   Post-operative Plan: Extubation in OR  Informed Consent: I have reviewed the patients History and Physical, chart, labs and discussed the procedure including the risks, benefits and alternatives for the proposed anesthesia with the patient or authorized representative who has indicated his/her understanding and acceptance.   Dental Advisory Given  Plan Discussed with: CRNA and Surgeon  Anesthesia Plan Comments: (Possible post induction arterial line)        Anesthesia Quick Evaluation

## 2016-09-07 ENCOUNTER — Inpatient Hospital Stay (HOSPITAL_COMMUNITY): Payer: Medicare Other

## 2016-09-07 ENCOUNTER — Inpatient Hospital Stay (HOSPITAL_COMMUNITY): Payer: Medicare Other | Admitting: Vascular Surgery

## 2016-09-07 ENCOUNTER — Encounter (HOSPITAL_COMMUNITY)
Admission: RE | Disposition: A | Discharge status: Home Health | Payer: Self-pay | Source: Ambulatory Visit | Attending: Orthopedic Surgery

## 2016-09-07 ENCOUNTER — Inpatient Hospital Stay (HOSPITAL_COMMUNITY)
Admission: RE | Admit: 2016-09-07 | Discharge: 2016-09-08 | DRG: 483 | Disposition: A | Discharge status: Home Health | Payer: Medicare Other | Source: Ambulatory Visit | Attending: Orthopedic Surgery | Admitting: Orthopedic Surgery

## 2016-09-07 ENCOUNTER — Inpatient Hospital Stay (HOSPITAL_COMMUNITY): Payer: Medicare Other | Admitting: Anesthesiology

## 2016-09-07 ENCOUNTER — Encounter (HOSPITAL_COMMUNITY): Payer: Self-pay | Admitting: Certified Registered Nurse Anesthetist

## 2016-09-07 DIAGNOSIS — K219 Gastro-esophageal reflux disease without esophagitis: Secondary | ICD-10-CM | POA: Diagnosis present

## 2016-09-07 DIAGNOSIS — M797 Fibromyalgia: Secondary | ICD-10-CM | POA: Diagnosis present

## 2016-09-07 DIAGNOSIS — Z96652 Presence of left artificial knee joint: Secondary | ICD-10-CM | POA: Diagnosis present

## 2016-09-07 DIAGNOSIS — M25511 Pain in right shoulder: Secondary | ICD-10-CM | POA: Diagnosis present

## 2016-09-07 DIAGNOSIS — M75101 Unspecified rotator cuff tear or rupture of right shoulder, not specified as traumatic: Secondary | ICD-10-CM | POA: Diagnosis present

## 2016-09-07 DIAGNOSIS — I10 Essential (primary) hypertension: Secondary | ICD-10-CM | POA: Diagnosis not present

## 2016-09-07 DIAGNOSIS — Z7982 Long term (current) use of aspirin: Secondary | ICD-10-CM | POA: Diagnosis not present

## 2016-09-07 DIAGNOSIS — E119 Type 2 diabetes mellitus without complications: Secondary | ICD-10-CM | POA: Diagnosis not present

## 2016-09-07 DIAGNOSIS — M19011 Primary osteoarthritis, right shoulder: Principal | ICD-10-CM | POA: Diagnosis present

## 2016-09-07 DIAGNOSIS — Z96611 Presence of right artificial shoulder joint: Secondary | ICD-10-CM

## 2016-09-07 DIAGNOSIS — E785 Hyperlipidemia, unspecified: Secondary | ICD-10-CM | POA: Diagnosis present

## 2016-09-07 DIAGNOSIS — Z7984 Long term (current) use of oral hypoglycemic drugs: Secondary | ICD-10-CM | POA: Diagnosis not present

## 2016-09-07 HISTORY — PX: REVERSE SHOULDER ARTHROPLASTY: SHX5054

## 2016-09-07 HISTORY — DX: Dyspnea, unspecified: R06.00

## 2016-09-07 LAB — GLUCOSE, CAPILLARY
GLUCOSE-CAPILLARY: 123 mg/dL — AB (ref 65–99)
GLUCOSE-CAPILLARY: 156 mg/dL — AB (ref 65–99)
Glucose-Capillary: 114 mg/dL — ABNORMAL HIGH (ref 65–99)
Glucose-Capillary: 112 mg/dL — ABNORMAL HIGH (ref 65–99)

## 2016-09-07 SURGERY — ARTHROPLASTY, SHOULDER, TOTAL, REVERSE
Anesthesia: Regional | Site: Shoulder | Laterality: Right

## 2016-09-07 MED ORDER — PROMETHAZINE HCL 25 MG/ML IJ SOLN
INTRAMUSCULAR | Status: AC
  Filled 2016-09-07: qty 1

## 2016-09-07 MED ORDER — PROPOFOL 10 MG/ML IV BOLUS
INTRAVENOUS | Status: AC
  Filled 2016-09-07: qty 20

## 2016-09-07 MED ORDER — FLEET ENEMA 7-19 GM/118ML RE ENEM: 1.0000 | ENEMA | Freq: Once | RECTAL | Status: DC | PRN

## 2016-09-07 MED ORDER — ROCURONIUM BROMIDE 10 MG/ML (PF) SYRINGE
PREFILLED_SYRINGE | INTRAVENOUS | Status: AC
  Filled 2016-09-07: qty 5

## 2016-09-07 MED ORDER — SODIUM CHLORIDE 0.9 % IV SOLN
INTRAVENOUS | Status: DC
  Administered 2016-09-07: 10:00:00 via INTRAVENOUS

## 2016-09-07 MED ORDER — SUGAMMADEX SODIUM 200 MG/2ML IV SOLN
INTRAVENOUS | Status: AC
  Filled 2016-09-07: qty 2

## 2016-09-07 MED ORDER — SODIUM CHLORIDE 0.9% FLUSH
INTRAVENOUS | Status: DC | PRN
  Administered 2016-09-07: 34 mL

## 2016-09-07 MED ORDER — INSULIN ASPART 100 UNIT/ML ~~LOC~~ SOLN
0.0000 [IU] | Freq: Three times a day (TID) | SUBCUTANEOUS | Status: DC
Start: 2016-09-07 — End: 2016-09-08
  Administered 2016-09-07: 2 [IU] via SUBCUTANEOUS
  Administered 2016-09-08: 3 [IU] via SUBCUTANEOUS

## 2016-09-07 MED ORDER — RISPERIDONE 0.5 MG PO TABS
3.0000 mg | ORAL_TABLET | Freq: Every day | ORAL | Status: DC
  Administered 2016-09-07: 3 mg via ORAL
  Filled 2016-09-07: qty 6

## 2016-09-07 MED ORDER — OXYCODONE HCL 5 MG PO TABS
5.0000 mg | ORAL_TABLET | ORAL | Status: DC | PRN
  Administered 2016-09-07 (×2): 10 mg via ORAL
  Administered 2016-09-08: 5 mg via ORAL
  Filled 2016-09-07 (×2): qty 2
  Filled 2016-09-07: qty 1

## 2016-09-07 MED ORDER — METOCLOPRAMIDE HCL 5 MG/ML IJ SOLN: 5.0000 mg | Freq: Three times a day (TID) | INTRAMUSCULAR | Status: DC | PRN

## 2016-09-07 MED ORDER — LIDOCAINE 2% (20 MG/ML) 5 ML SYRINGE
INTRAMUSCULAR | Status: DC | PRN
  Administered 2016-09-07: 60 mg via INTRAVENOUS

## 2016-09-07 MED ORDER — ONDANSETRON HCL 4 MG PO TABS: 4.0000 mg | ORAL_TABLET | Freq: Four times a day (QID) | ORAL | Status: DC | PRN

## 2016-09-07 MED ORDER — FENTANYL CITRATE (PF) 100 MCG/2ML IJ SOLN
INTRAMUSCULAR | Status: DC | PRN
  Administered 2016-09-07 (×3): 25 ug via INTRAVENOUS
  Administered 2016-09-07: 50 ug via INTRAVENOUS
  Administered 2016-09-07: 25 ug via INTRAVENOUS

## 2016-09-07 MED ORDER — LACTATED RINGERS IV SOLN
INTRAVENOUS | Status: DC | PRN
  Administered 2016-09-07: 07:00:00 via INTRAVENOUS

## 2016-09-07 MED ORDER — ONDANSETRON HCL 4 MG/2ML IJ SOLN: 4.0000 mg | Freq: Four times a day (QID) | INTRAMUSCULAR | Status: DC | PRN

## 2016-09-07 MED ORDER — PHENYLEPHRINE 40 MCG/ML (10ML) SYRINGE FOR IV PUSH (FOR BLOOD PRESSURE SUPPORT)
PREFILLED_SYRINGE | INTRAVENOUS | Status: AC
  Filled 2016-09-07: qty 10

## 2016-09-07 MED ORDER — PROPOFOL 10 MG/ML IV BOLUS
INTRAVENOUS | Status: DC | PRN
  Administered 2016-09-07: 100 mg via INTRAVENOUS
  Administered 2016-09-07: 50 mg via INTRAVENOUS

## 2016-09-07 MED ORDER — PHENYLEPHRINE HCL 10 MG/ML IJ SOLN
INTRAVENOUS | Status: DC | PRN
  Administered 2016-09-07: 20 ug/min via INTRAVENOUS

## 2016-09-07 MED ORDER — ONDANSETRON HCL 4 MG/2ML IJ SOLN
INTRAMUSCULAR | Status: DC | PRN
  Administered 2016-09-07 (×2): 4 mg via INTRAVENOUS

## 2016-09-07 MED ORDER — POTASSIUM CHLORIDE CRYS ER 20 MEQ PO TBCR
20.0000 meq | EXTENDED_RELEASE_TABLET | Freq: Two times a day (BID) | ORAL | Status: DC
  Administered 2016-09-07 – 2016-09-08 (×3): 20 meq via ORAL
  Filled 2016-09-07 (×3): qty 1

## 2016-09-07 MED ORDER — TRAMADOL HCL 50 MG PO TABS
50.0000 mg | ORAL_TABLET | Freq: Four times a day (QID) | ORAL | Status: DC | PRN
  Administered 2016-09-07 – 2016-09-08 (×3): 50 mg via ORAL
  Filled 2016-09-07 (×4): qty 1

## 2016-09-07 MED ORDER — PANTOPRAZOLE SODIUM 40 MG PO TBEC
40.0000 mg | DELAYED_RELEASE_TABLET | Freq: Every day | ORAL | Status: DC
  Administered 2016-09-07 – 2016-09-08 (×2): 40 mg via ORAL
  Filled 2016-09-07 (×2): qty 1

## 2016-09-07 MED ORDER — LORATADINE 10 MG PO TABS
10.0000 mg | ORAL_TABLET | Freq: Every day | ORAL | Status: DC
  Administered 2016-09-07 – 2016-09-08 (×2): 10 mg via ORAL
  Filled 2016-09-07 (×2): qty 1

## 2016-09-07 MED ORDER — GABAPENTIN 100 MG PO CAPS
100.0000 mg | ORAL_CAPSULE | Freq: Three times a day (TID) | ORAL | Status: DC
  Administered 2016-09-07 – 2016-09-08 (×5): 100 mg via ORAL
  Filled 2016-09-07 (×5): qty 1

## 2016-09-07 MED ORDER — ONDANSETRON HCL 4 MG/2ML IJ SOLN
INTRAMUSCULAR | Status: AC
  Filled 2016-09-07: qty 2

## 2016-09-07 MED ORDER — BUPIVACAINE LIPOSOME 1.3 % IJ SUSP
20.0000 mL | INTRAMUSCULAR | Status: AC
  Administered 2016-09-07: 16 mL
  Filled 2016-09-07: qty 20

## 2016-09-07 MED ORDER — FENTANYL CITRATE (PF) 250 MCG/5ML IJ SOLN
INTRAMUSCULAR | Status: AC
  Filled 2016-09-07: qty 5

## 2016-09-07 MED ORDER — SUGAMMADEX SODIUM 200 MG/2ML IV SOLN
INTRAVENOUS | Status: AC
Start: 2016-09-07 — End: 2016-09-07
  Filled 2016-09-07: qty 2

## 2016-09-07 MED ORDER — SODIUM CHLORIDE 0.9 % IR SOLN
Status: DC | PRN
  Administered 2016-09-07: 3000 mL

## 2016-09-07 MED ORDER — METOCLOPRAMIDE HCL 5 MG PO TABS: 5.0000 mg | ORAL_TABLET | Freq: Three times a day (TID) | ORAL | Status: DC | PRN

## 2016-09-07 MED ORDER — ACETAMINOPHEN 500 MG PO TABS
1000.0000 mg | ORAL_TABLET | Freq: Four times a day (QID) | ORAL | Status: AC
  Administered 2016-09-07 – 2016-09-08 (×4): 1000 mg via ORAL
  Filled 2016-09-07 (×4): qty 2

## 2016-09-07 MED ORDER — 0.9 % SODIUM CHLORIDE (POUR BTL) OPTIME
TOPICAL | Status: DC | PRN
  Administered 2016-09-07: 1000 mL

## 2016-09-07 MED ORDER — ONDANSETRON HCL 4 MG/2ML IJ SOLN: 4.0000 mg | Freq: Once | INTRAMUSCULAR | Status: DC | PRN

## 2016-09-07 MED ORDER — TRANEXAMIC ACID 1000 MG/10ML IV SOLN
1000.0000 mg | INTRAVENOUS | Status: AC
  Administered 2016-09-07: 1000 mg via INTRAVENOUS
  Filled 2016-09-07: qty 10

## 2016-09-07 MED ORDER — MORPHINE SULFATE (PF) 4 MG/ML IV SOLN: 1.0000 mg | INTRAVENOUS | Status: DC | PRN

## 2016-09-07 MED ORDER — BENZTROPINE MESYLATE 0.5 MG PO TABS
0.5000 mg | ORAL_TABLET | Freq: Two times a day (BID) | ORAL | Status: DC
  Administered 2016-09-07 – 2016-09-08 (×3): 0.5 mg via ORAL
  Filled 2016-09-07 (×4): qty 1

## 2016-09-07 MED ORDER — ALUM & MAG HYDROXIDE-SIMETH 200-200-20 MG/5ML PO SUSP: 30.0000 mL | ORAL | Status: DC | PRN

## 2016-09-07 MED ORDER — SUGAMMADEX SODIUM 200 MG/2ML IV SOLN
INTRAVENOUS | Status: DC | PRN
  Administered 2016-09-07: 250 mg via INTRAVENOUS

## 2016-09-07 MED ORDER — POVIDONE-IODINE 10 % EX SWAB: 2.0000 "application " | Freq: Once | CUTANEOUS | Status: DC

## 2016-09-07 MED ORDER — PROMETHAZINE HCL 25 MG/ML IJ SOLN
6.2500 mg | Freq: Four times a day (QID) | INTRAMUSCULAR | Status: DC | PRN
  Administered 2016-09-07: 6.25 mg via INTRAVENOUS

## 2016-09-07 MED ORDER — DIPHENHYDRAMINE HCL 12.5 MG/5ML PO ELIX: 12.5000 mg | ORAL_SOLUTION | ORAL | Status: DC | PRN

## 2016-09-07 MED ORDER — POTASSIUM CHLORIDE ER 20 MEQ PO TBCR: 20.0000 meq | EXTENDED_RELEASE_TABLET | Freq: Two times a day (BID) | ORAL | Status: DC

## 2016-09-07 MED ORDER — PRAVASTATIN SODIUM 20 MG PO TABS
20.0000 mg | ORAL_TABLET | Freq: Every day | ORAL | Status: DC
  Administered 2016-09-07: 20 mg via ORAL
  Filled 2016-09-07: qty 1

## 2016-09-07 MED ORDER — LIDOCAINE 2% (20 MG/ML) 5 ML SYRINGE
INTRAMUSCULAR | Status: AC
  Filled 2016-09-07: qty 5

## 2016-09-07 MED ORDER — PHENYLEPHRINE 40 MCG/ML (10ML) SYRINGE FOR IV PUSH (FOR BLOOD PRESSURE SUPPORT)
PREFILLED_SYRINGE | INTRAVENOUS | Status: DC | PRN
  Administered 2016-09-07 (×3): 80 ug via INTRAVENOUS
  Administered 2016-09-07 (×2): 40 ug via INTRAVENOUS
  Administered 2016-09-07: 80 ug via INTRAVENOUS

## 2016-09-07 MED ORDER — MENTHOL 3 MG MT LOZG: 1.0000 | LOZENGE | OROMUCOSAL | Status: DC | PRN

## 2016-09-07 MED ORDER — FENTANYL CITRATE (PF) 100 MCG/2ML IJ SOLN: 25.0000 ug | INTRAMUSCULAR | Status: DC | PRN

## 2016-09-07 MED ORDER — BISACODYL 5 MG PO TBEC: 5.0000 mg | DELAYED_RELEASE_TABLET | Freq: Every day | ORAL | Status: DC | PRN

## 2016-09-07 MED ORDER — ACETAMINOPHEN 650 MG RE SUPP: 650.0000 mg | Freq: Four times a day (QID) | RECTAL | Status: DC | PRN

## 2016-09-07 MED ORDER — PHENOL 1.4 % MT LIQD: 1.0000 | OROMUCOSAL | Status: DC | PRN

## 2016-09-07 MED ORDER — POLYETHYLENE GLYCOL 3350 17 G PO PACK: 17.0000 g | PACK | Freq: Every day | ORAL | Status: DC | PRN

## 2016-09-07 MED ORDER — ACETAMINOPHEN 325 MG PO TABS: 650.0000 mg | ORAL_TABLET | Freq: Four times a day (QID) | ORAL | Status: DC | PRN

## 2016-09-07 MED ORDER — ASPIRIN EC 325 MG PO TBEC
325.0000 mg | DELAYED_RELEASE_TABLET | Freq: Every day | ORAL | Status: DC
  Administered 2016-09-07 – 2016-09-08 (×2): 325 mg via ORAL
  Filled 2016-09-07 (×2): qty 1

## 2016-09-07 MED ORDER — ROPIVACAINE HCL 5 MG/ML IJ SOLN
INTRAMUSCULAR | Status: DC | PRN
  Administered 2016-09-07: 30 mL via PERINEURAL

## 2016-09-07 MED ORDER — VANCOMYCIN HCL IN DEXTROSE 1-5 GM/200ML-% IV SOLN
1000.0000 mg | INTRAVENOUS | Status: AC
  Administered 2016-09-07: 1000 mg via INTRAVENOUS
  Filled 2016-09-07: qty 200

## 2016-09-07 MED ORDER — HYDROXYZINE HCL 10 MG PO TABS
10.0000 mg | ORAL_TABLET | Freq: Two times a day (BID) | ORAL | Status: DC | PRN
  Filled 2016-09-07: qty 1

## 2016-09-07 MED ORDER — ROCURONIUM BROMIDE 10 MG/ML (PF) SYRINGE
PREFILLED_SYRINGE | INTRAVENOUS | Status: DC | PRN
  Administered 2016-09-07: 40 mg via INTRAVENOUS
  Administered 2016-09-07: 10 mg via INTRAVENOUS

## 2016-09-07 MED ORDER — CLINDAMYCIN PHOSPHATE 600 MG/50ML IV SOLN
600.0000 mg | Freq: Four times a day (QID) | INTRAVENOUS | Status: AC
  Administered 2016-09-07 – 2016-09-08 (×3): 600 mg via INTRAVENOUS
  Filled 2016-09-07 (×3): qty 50

## 2016-09-07 MED ORDER — CHLORHEXIDINE GLUCONATE 4 % EX LIQD: 60.0000 mL | Freq: Once | CUTANEOUS | Status: DC

## 2016-09-07 MED ORDER — DOCUSATE SODIUM 100 MG PO CAPS
100.0000 mg | ORAL_CAPSULE | Freq: Two times a day (BID) | ORAL | Status: DC
  Administered 2016-09-07 – 2016-09-08 (×3): 100 mg via ORAL
  Filled 2016-09-07 (×3): qty 1

## 2016-09-07 SURGICAL SUPPLY — 84 items
AID PSTN UNV HD RSTRNT DISP (MISCELLANEOUS) ×1
BASEPLATE P2 COATD GLND 6.5X30 (Shoulder) IMPLANT
BIT DRILL 5/64X5 DISP (BIT) IMPLANT
BLADE SAW SAG 73X25 THK (BLADE) ×1
BLADE SAW SGTL 73X25 THK (BLADE) ×1 IMPLANT
BLADE SURG 15 STRL LF DISP TIS (BLADE) IMPLANT
BLADE SURG 15 STRL SS (BLADE)
BOWL SMART MIX CTS (DISPOSABLE) IMPLANT
BSPLAT GLND 30 STRL LF SHLDR (Shoulder) ×1 IMPLANT
CHLORAPREP W/TINT 26ML (MISCELLANEOUS) ×2 IMPLANT
CLSR STERI-STRIP ANTIMIC 1/2X4 (GAUZE/BANDAGES/DRESSINGS) ×1 IMPLANT
COVER MAYO STAND STRL (DRAPES) IMPLANT
COVER SURGICAL LIGHT HANDLE (MISCELLANEOUS) ×2 IMPLANT
DRAPE INCISE IOBAN 66X45 STRL (DRAPES) ×2 IMPLANT
DRAPE ORTHO SPLIT 77X108 STRL (DRAPES) ×4
DRAPE SURG ORHT 6 SPLT 77X108 (DRAPES) ×2 IMPLANT
DRSG AQUACEL AG ADV 3.5X10 (GAUZE/BANDAGES/DRESSINGS) ×2 IMPLANT
ELECT BLADE 4.0 EZ CLEAN MEGAD (MISCELLANEOUS) ×2
ELECT REM PT RETURN 9FT ADLT (ELECTROSURGICAL) ×2
ELECTRODE BLDE 4.0 EZ CLN MEGD (MISCELLANEOUS) ×1 IMPLANT
ELECTRODE REM PT RTRN 9FT ADLT (ELECTROSURGICAL) ×1 IMPLANT
EVACUATOR 1/8 PVC DRAIN (DRAIN) IMPLANT
GLOVE BIO SURGEON STRL SZ7 (GLOVE) ×2 IMPLANT
GLOVE BIO SURGEON STRL SZ7.5 (GLOVE) ×2 IMPLANT
GLOVE BIOGEL PI IND STRL 7.0 (GLOVE) ×1 IMPLANT
GLOVE BIOGEL PI IND STRL 8 (GLOVE) ×1 IMPLANT
GLOVE BIOGEL PI INDICATOR 7.0 (GLOVE) ×1
GLOVE BIOGEL PI INDICATOR 8 (GLOVE) ×1
GOWN STRL REUS W/ TWL LRG LVL3 (GOWN DISPOSABLE) ×3 IMPLANT
GOWN STRL REUS W/ TWL XL LVL3 (GOWN DISPOSABLE) ×1 IMPLANT
GOWN STRL REUS W/TWL LRG LVL3 (GOWN DISPOSABLE) ×6
GOWN STRL REUS W/TWL XL LVL3 (GOWN DISPOSABLE) ×2
HANDPIECE INTERPULSE COAX TIP (DISPOSABLE) ×2
HOOD PEEL AWAY FLYTE STAYCOOL (MISCELLANEOUS) ×4 IMPLANT
INSERT SMALL SOCKET 32MM NEU (Insert) ×1 IMPLANT
KIT BASIN OR (CUSTOM PROCEDURE TRAY) ×2 IMPLANT
KIT ROOM TURNOVER OR (KITS) ×2 IMPLANT
MANIFOLD NEPTUNE II (INSTRUMENTS) ×2 IMPLANT
NDL HYPO 25GX1X1/2 BEV (NEEDLE) IMPLANT
NDL MAYO TROCAR (NEEDLE) IMPLANT
NDL SPNL 22GX3.5 QUINCKE BK (NEEDLE) IMPLANT
NEEDLE HYPO 25GX1X1/2 BEV (NEEDLE) IMPLANT
NEEDLE MAYO TROCAR (NEEDLE) IMPLANT
NEEDLE SPNL 22GX3.5 QUINCKE BK (NEEDLE) ×2 IMPLANT
NOZZLE PRISM 8.5MM (MISCELLANEOUS) IMPLANT
NS IRRIG 1000ML POUR BTL (IV SOLUTION) ×2 IMPLANT
P2 COATDE GLNOID BSEPLT 6.5X30 (Shoulder) ×2 IMPLANT
PACK SHOULDER (CUSTOM PROCEDURE TRAY) ×2 IMPLANT
PAD ARMBOARD 7.5X6 YLW CONV (MISCELLANEOUS) ×4 IMPLANT
RESTRAINT HEAD UNIVERSAL NS (MISCELLANEOUS) ×2 IMPLANT
RETRIEVER SUT HEWSON (MISCELLANEOUS) IMPLANT
SCREW BONE LOCKING RSP 5.0X14 (Screw) ×2 IMPLANT
SCREW BONE LOCKING RSP 5.0X30 (Screw) ×4 IMPLANT
SCREW BONE RSP LOCK 5X14 (Screw) IMPLANT
SCREW BONE RSP LOCK 5X18 (Screw) IMPLANT
SCREW BONE RSP LOCK 5X30 (Screw) IMPLANT
SCREW BONE RSP LOCKING 18MM LG (Screw) ×2 IMPLANT
SCREW RETAIN W/HEAD 4MM OFFSET (Shoulder) ×1 IMPLANT
SET HNDPC FAN SPRY TIP SCT (DISPOSABLE) ×1 IMPLANT
SLING ARM FOAM STRAP LRG (SOFTGOODS) ×2 IMPLANT
SLING ARM FOAM STRAP MED (SOFTGOODS) ×1 IMPLANT
SLING ARM IMMOBILIZER MED (SOFTGOODS) ×1 IMPLANT
SPONGE LAP 18X18 X RAY DECT (DISPOSABLE) ×2 IMPLANT
SPONGE LAP 4X18 X RAY DECT (DISPOSABLE) IMPLANT
STEM HUMERAL REV SHL 6X108 SM (Stem) ×1 IMPLANT
STRIP CLOSURE SKIN 1/2X4 (GAUZE/BANDAGES/DRESSINGS) ×2 IMPLANT
SUCTION FRAZIER HANDLE 10FR (MISCELLANEOUS) ×1
SUCTION TUBE FRAZIER 10FR DISP (MISCELLANEOUS) ×1 IMPLANT
SUPPORT WRAP ARM LG (MISCELLANEOUS) ×2 IMPLANT
SUT ETHIBOND NAB CT1 #1 30IN (SUTURE) ×2 IMPLANT
SUT FIBERWIRE #2 38 T-5 BLUE (SUTURE) ×2
SUT MNCRL AB 4-0 PS2 18 (SUTURE) ×2 IMPLANT
SUT SILK 2 0 TIES 17X18 (SUTURE)
SUT SILK 2-0 18XBRD TIE BLK (SUTURE) IMPLANT
SUT VIC AB 2-0 CT1 27 (SUTURE) ×2
SUT VIC AB 2-0 CT1 TAPERPNT 27 (SUTURE) ×1 IMPLANT
SUTURE FIBERWR #2 38 T-5 BLUE (SUTURE) ×1 IMPLANT
SYR 30ML LL (SYRINGE) IMPLANT
SYRINGE TOOMEY DISP (SYRINGE) ×3 IMPLANT
TAPE FIBER 2MM 7IN #2 BLUE (SUTURE) IMPLANT
TOWEL OR 17X24 6PK STRL BLUE (TOWEL DISPOSABLE) ×2 IMPLANT
TOWEL OR 17X26 10 PK STRL BLUE (TOWEL DISPOSABLE) ×2 IMPLANT
WATER STERILE IRR 1000ML POUR (IV SOLUTION) ×2 IMPLANT
YANKAUER SUCT BULB TIP NO VENT (SUCTIONS) IMPLANT

## 2016-09-07 NOTE — Anesthesia Procedure Notes (Signed)
Anesthesia Regional Block: Interscalene brachial plexus block   Pre-Anesthetic Checklist: ,, timeout performed, Correct Patient, Correct Site, Correct Laterality, Correct Procedure,, site marked, risks and benefits discussed, Surgical consent,  Pre-op evaluation,  At surgeon's request and post-op pain management  Laterality: Right  Prep: chloraprep       Needles:  Injection technique: Single-shot  Needle Type: Echogenic Stimulator Needle     Needle Length: 9cm  Needle Gauge: 21     Additional Needles:   Procedures: ultrasound guided,,,,,,,,  Narrative:  Start time: 09/07/2016 7:10 AM End time: 09/07/2016 7:20 AM Injection made incrementally with aspirations every 5 mL.  Performed by: Personally  Anesthesiologist: Karna ChristmasELLENDER, RYAN P  Additional Notes: Functioning IV was confirmed and monitors were applied.  A 90mm 21ga Arrow echogenic stimulator needle was used. Sterile prep,hand hygiene and sterile gloves were used.  Negative aspiration and negative test dose prior to incremental administration of local anesthetic. The patient tolerated the procedure well.

## 2016-09-07 NOTE — Anesthesia Procedure Notes (Signed)
Procedure Name: Intubation Date/Time: 09/07/2016 7:42 AM Performed by: Rise PatienceBELL, Jini Horiuchi T Pre-anesthesia Checklist: Patient identified, Emergency Drugs available, Suction available and Patient being monitored Patient Re-evaluated:Patient Re-evaluated prior to induction Oxygen Delivery Method: Circle System Utilized Preoxygenation: Pre-oxygenation with 100% oxygen Induction Type: IV induction Ventilation: Mask ventilation without difficulty and Oral airway inserted - appropriate to patient size Laryngoscope Size: Hyacinth MeekerMiller and 2 Grade View: Grade I Tube type: Oral Tube size: 7.0 mm Number of attempts: 1 Airway Equipment and Method: Stylet and Oral airway Placement Confirmation: ETT inserted through vocal cords under direct vision,  positive ETCO2 and breath sounds checked- equal and bilateral Secured at: 22 cm Tube secured with: Tape Dental Injury: Teeth and Oropharynx as per pre-operative assessment

## 2016-09-07 NOTE — H&P (Signed)
Katherine [name] is an 71 y.o. female.   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  Pain interferes with sleep and quality of life.   Past Medical History:  Diagnosis Date  . ABDOMINAL PAIN, LOWER 02/04/2010  . ABNORMAL THYROID FUNCTION TESTS 08/04/2009  . ALLERGIC RHINITIS 09/05/2006  . ANXIETY 07/31/2007  . Aortic stenosis    followed at [facility]; 06/14/16 EF 55-60%, mod-severe AS, peak/mean grad 26.46 mmHg/ 16.31 mmHg, AVA by continuity eq 0.90 cm2  . Blood transfusion   . Cataract    resolved -surgery done  . DEPRESSION 09/05/2006  . DIABETES MELLITUS, TYPE II 07/31/2007   no meds  . Diabetic foot ulcer (HCC) 10/26/2014  . DIVERTICULOSIS, COLON 09/05/2006  . Dysrhythmia    patient had a "irregular beat that is being monitored by pcp"  . Dysuria 04/15/2010  . Failure to thrive in childhood 04/06/2010  . FATIGUE 04/15/2010  . FIBROMYALGIA 05/10/2009  . FRACTURE, PELVIS, RIGHT 04/15/2010  . GASTROENTERITIS, ACUTE 12/25/2008  . GERD 09/05/2006  . Heart murmur   . HYPERLIPIDEMIA 09/05/2006  . HYPERTENSION 09/05/2006  . KNEE PAIN, LEFT 02/04/2010  . MRSA infection 10/26/2014  . OSTEOARTHRITIS 09/05/2006  . OSTEOPOROSIS 07/31/2007  . Other specified forms of hearing loss 11/01/2009  . SINUSITIS- ACUTE-NOS 10/09/2007  . SKIN LESION 05/10/2009  . SPRAIN&STRAIN OTH SPEC SITES SHOULDER&UPPER ARM 11/01/2009  . Unspecified hearing loss 10/09/2007  . URI 02/20/2007  . UTI 02/20/2007    Past Surgical History:  Procedure Laterality Date  . ABDOMINAL HYSTERECTOMY  12/08   Bladder tac, partial hysterectomy  . BREAST BIOPSY    . catarct removal     both eyes  . COLONOSCOPY    . I&D KNEE WITH POLY EXCHANGE Left 01/03/2013   Procedure: IRRIGATION AND DEBRIDEMENT LEFT KNEE WITH POLY EXCHANGE;  Surgeon: Kathryne Hitch, MD;  Location: [facility];  Service: Orthopedics;  Laterality:  Left;  . inguinal herniorrhapy     right Dr. Freida Busman 06/2009  . TONSILLECTOMY    . TOTAL KNEE ARTHROPLASTY Left 12/20/2012   Procedure: LEFT TOTAL KNEE ARTHROPLASTY;  Surgeon: Kathryne Hitch, MD;  Location: [facility];  Service: Orthopedics;  Laterality: Left;    Family History  Problem Relation Age of Onset  . Coronary artery disease Other   . Diabetes Other   . Colon cancer Neg Hx   . Esophageal cancer Neg Hx   . Stomach cancer Neg Hx   . Rectal cancer Neg Hx    Social History:  reports that she has never smoked. She has never used smokeless tobacco. She reports that she does not drink alcohol or use drugs.  Allergies:  Allergies  Allergen Reactions  . Penicillins Other (See Comments)    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 PATIENT HAS HAD A PCN REACTION WITH IMMEDIATE RASH, FACIAL/TONGUE/THROAT SWELLING, SOB, OR LIGHTHEADEDNESS WITH HYPOTENSION:  #  #  #  YES  #  #  #   Has patient had a PCN reaction causing severe rash involving mucus membranes or skin necrosis: Unknown Has patient had a PCN reaction that required hospitalization: Unknown Has patient had a PCN reaction occurring within the last 10 years: Unknown   . Coconut Oil     UNSPECIFIED REACTION   . Pollen Extract     UNSPECIFIED REACTION   . Mellaril [Thioridazine] Itching  . Thioridazine Hcl Itching  Medications Prior to Admission  Medication Sig Dispense Refill  . aspirin EC 81 MG tablet Take 81 mg by mouth 2 (two) times daily.    . benztropine (COGENTIN) 0.5 MG tablet Take 0.5 mg by mouth 2 (two) times daily.      . calcium-vitamin D (OSCAL WITH D) 500-200 MG-UNIT tablet Take 1 tablet by mouth 3 (three) times daily. (Patient taking differently: Take 1 tablet by mouth every other day. ) 270 tablet 3  . celecoxib (CELEBREX) 200 MG capsule Take 200 mg by mouth at bedtime.    . cetirizine (ZYRTEC) 10 MG tablet TAKE 1 TABLET BY MOUTH DAILY 30 tablet 0  . gabapentin (NEURONTIN) 100 MG capsule Take 100 mg by  mouth 3 (three) times daily.     . hydrOXYzine (ATARAX/VISTARIL) 10 MG tablet Take 1 tablet (10 mg total) by mouth 2 (two) times daily as needed. (Patient taking differently: Take 10 mg by mouth 2 (two) times daily. ) 30 tablet 11  . ipratropium (ATROVENT) 0.06 % nasal spray USE 1 SPRAY IN EACH NOSTRIL THREE TIMES DAILY (Patient taking differently: USE 1 SPRAY IN EACH NOSTRIL THREE TIMES DAILY AS NEEDED FOR ALLERGIES/SINUS) 30 mL 11  . metFORMIN (GLUCOPHAGE) 500 MG tablet Take 500 mg by mouth 2 (two) times daily.     . Multiple Vitamin (MULTIVITAMIN WITH MINERALS) TABS tablet Take 1 tablet by mouth daily.    [name] omega-3 acid ethyl esters (LOVAZA) 1 g capsule Take 1 g by mouth daily.     . pantoprazole (PROTONIX) 40 MG tablet TAKE 1 TABLET BY MOUTH DAILY 90 tablet 0  . Potassium Chloride ER 20 MEQ TBCR Take 20 mEq by mouth 2 (two) times daily.  1  . pravastatin (PRAVACHOL) 20 MG tablet Take 20 mg by mouth daily.  3  . risperiDONE (RISPERDAL) 3 MG tablet Take 3 mg by mouth at bedtime.     . traMADol (ULTRAM) 50 MG tablet Take 50 mg by mouth every 6 (six) hours as needed (for pain.).     [name] UREA 20 INTENSIVE HYDRATING 20 % cream Apply 1 application topically daily as needed. For dry skin  6  . glucose blood (ONETOUCH VERIO) test strip 1 each by Other route 2 (two) times daily. Use to check blood sugars twice a day Dx E11.9    . HYDROcodone-acetaminophen (NORCO) 5-325 MG tablet Take 1 tablet by mouth every 6 (six) hours as needed for moderate pain. (Patient not taking: Reported on 08/30/2016) 20 tablet 0  . ONETOUCH DELICA LANCETS 33G MISC CHECK BLOOD SUGAR ONCE DAILY AS DIRECTED 100 each 0    No results found for this or any previous visit (from the past 48 hour(s)). No results found.  Review of Systems  All other systems reviewed and are negative.   Blood pressure 140/70, pulse 63, temperature 97.7 F (36.5 C), temperature source Oral, resp. rate 16, SpO2 97 %. Physical Exam  Constitutional: She  is oriented to person, place, and time. She appears well-developed and well-nourished.  HENT:  Head: Atraumatic.  Eyes: EOM are normal.  Cardiovascular: Intact distal pulses.   Respiratory: Effort normal.  Musculoskeletal:  R shoulder with limited ROM and significant pain. Distally NVI.  Neurological: She is alert and oriented to person, place, and time.  Skin: Skin is warm and dry.  Psychiatric: She has a normal mood and affect.     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  Pain interferes with sleep and quality of life. Plan R reverse TSA Risks / benefits of surgery discussed Consent on chart  NPO for OR Preop antibiotics   Mable ParisHANDLER,Annalysse Shoemaker WILLIAM, MD 09/07/2016, 7:05 AM

## 2016-09-07 NOTE — ED Notes (Signed)
[facility] Gwen contacted this ER NS requesting we find the pt's belongings. No details regarding the belongings; what the belongings were being held in and/or what they looked like; were given to this NS. [facility] that I get the name of the RN that the floor RN took report from. This NS was then transferred to the RN of the pt. The RN of the pt stated that the got report from the PACU. I told the RN on the floor that I would call the PACU to see if the belongings got left behind. Called Crystal in the PACU and she stated the belongings are in the PACU. This NS confirmed with Crystal that she would get them transported to [facility]. Contacted Gwen back and made her aware that Crystal from the PACU will get the pt's belongings to [facility].

## 2016-09-07 NOTE — Op Note (Signed)
Procedure(s): REVERSE SHOULDER ARTHROPLASTY Procedure Note  Katherine [name] female 71 y.o. 09/07/2016  Procedure(s) and Anesthesia Type:    * RIGHT REVERSE SHOULDER ARTHROPLASTY - Choice   Indications:  71 y.o. female  With endstage right shoulder arthritis with irrepairable rotator cuff tear. Pain and dysfunction interfered with quality of life and nonoperative treatment with activity modification, NSAIDS and injections failed.     Surgeon: Mable ParisHANDLER,Kinston Magnan WILLIAM   Assistants: Damita Lackanielle Lalibert PA-C [facility](Danielle was present and scrubbed throughout the procedure and was essential in positioning, retraction, exposure, and closure)  Anesthesia: General endotracheal anesthesia with preoperative interscalene block given by the attending anesthesiologist    Procedure Detail  REVERSE SHOULDER ARTHROPLASTY   Estimated Blood Loss:  200 mL         Drains: none  Blood Given: none          Specimens: none        Complications:  * No complications entered in OR log *         Disposition: PACU - hemodynamically stable.         Condition: stable      OPERATIVE FINDINGS:  A DJO Altivate pressfit reverse total shoulder arthroplasty was placed with a  size 6 stem, a 32-4 glenosphere, and a standard poly insert. The base plate  fixation was fair.  PROCEDURE: The patient was identified in the preoperative holding area  where I personally marked the operative site after verifying site, side,  and procedure with the patient. An interscalene block given by  the attending anesthesiologist in the holding area and the patient was taken back to the operating room where all extremities were  carefully padded in position after general anesthesia was induced. She  was placed in a beach-chair position and the operative upper extremity was  prepped and draped in a standard sterile fashion. The patient received 1 g IV tranexamic acid at the start of the case around time of the incision. An  approximately 10-  cm incision was made from the tip of the coracoid process to the center  point of the humerus at the level of the axilla. Dissection was carried  down through subcutaneous tissues to the level of the cephalic vein  which was taken laterally with the deltoid. The pectoralis major was  retracted medially. The subdeltoid space was developed and the lateral  edge of the conjoined tendon was identified. The undersurface of  conjoined tendon was palpated and the musculocutaneous nerve was not in  the field. Retractor was placed underneath the conjoined and second  retractor was placed lateral into the deltoid. The circumflex humeral  artery and vessels were identified and clamped and coagulated. The  biceps tendon was absent.  The subscapularis was chronically torn.  The  joint was then gently externally rotated while the capsule was released  from the humeral neck around to just beyond the 6 o'clock position. At  this point, the joint was dislocated and the humeral head was presented  into the wound. The excessive osteophyte formation was removed with a  large rongeur.  The cutting guide was used to make the appropriate  head cut and the head was saved for potentially bone grafting.  The glenoid was exposed with the arm in an  abducted extended position. The anterior and posterior labrum were  completely excised and the capsule was released circumferentially to  allow for exposure of the glenoid for preparation. The 2.5 mm drill was  placed using the guide  in 5-10 inferior angulation and the tap was then advanced in the same hole. Small and large reamers were then used. The tap was then removed and the Metaglene was then screwed in with fair purchase.  The peripheral guide was then used to drilled measured and filled peripheral locking screws. The size  32-4  glenosphere was then impacted on the [facility] taper and the central screw was placed. The humerus was then again exposed and  the diaphyseal reamers were used followed by the metaphyseal reamers. The final broach was left in place in the proximal trial was placed. The joint was reduced and with this implant it was felt that soft tissue tensioning was appropriate with excellent stability and excellent range of motion. Therefore, final humeral stem was placed press-fit with bone graft.  And then the trial polyethylene inserts were tested again and the above implant was felt to be the most appropriate for final insertion. The joint was reduced taken through full range of motion and felt to be stable. Soft tissue tension was appropriate.  The joint was then copiously irrigated with pulse  lavage and the wound was then closed. The subscapularis was not repaired.  Throughout the case a mixture of 20 mL liposomal bupivacaine and 40 mL normal saline was used to infiltrate the deep capsular tissue, bony surfaces and subcutaneous tissue. Skin was closed with 2-0 Vicryl in a deep dermal layer and 4-0  Monocryl for skin closure. Steri-Strips were applied. Sterile  dressings were then applied as well as a sling. The patient was allowed  to awaken from general anesthesia, transferred to stretcher, and taken  to recovery room in stable condition.   POSTOPERATIVE PLAN: The patient will be kept in the hospital postoperatively  for pain control and therapy.

## 2016-09-07 NOTE — Transfer of Care (Signed)
Immediate Anesthesia Transfer of Care Note  Patient: Katherine [name]  Procedure(s) Performed: Procedure(s) with comments: REVERSE SHOULDER ARTHROPLASTY (Right) - Right reverse shoulder arthroplasty  Patient Location: PACU  Anesthesia Type:General and Regional  Level of Consciousness: awake, alert  and oriented  Airway & Oxygen Therapy: Patient Spontanous Breathing and Patient connected to nasal cannula oxygen  Post-op Assessment: Report given to RN and Post -op Vital signs reviewed and stable  Post vital signs: Reviewed and stable  Last Vitals:  Vitals:   09/07/16 0636  BP: 140/70  Pulse: 63  Resp: 16  Temp: 36.5 C  SpO2: 97%    Last Pain:  Vitals:   09/07/16 0636  TempSrc: Oral  PainSc:       Patients Stated Pain Goal: 3 (09/07/16 0615)  Complications: No apparent anesthesia complications

## 2016-09-07 NOTE — Anesthesia Postprocedure Evaluation (Signed)
Anesthesia Post Note  Patient: [place] Cellaramela A Sanon  Procedure(s) Performed: Procedure(s) (LRB): REVERSE SHOULDER ARTHROPLASTY (Right)     Patient location during evaluation: PACU Anesthesia Type: Regional and General Level of consciousness: awake and alert Pain management: pain level controlled Vital Signs Assessment: post-procedure vital signs reviewed and stable Respiratory status: spontaneous breathing, nonlabored ventilation, respiratory function stable and patient connected to nasal cannula oxygen Cardiovascular status: blood pressure returned to baseline and stable Postop Assessment: no signs of nausea or vomiting Anesthetic complications: no    Last Vitals:  Vitals:   09/07/16 1022 09/07/16 1042  BP: 115/73 (!) 160/75  Pulse: 64 70  Resp: 13 14  Temp: 36.6 C (!) 36.4 C  SpO2: 97% 96%    Last Pain:  Vitals:   09/07/16 1042  TempSrc: Oral  PainSc:                  Ryan P Ellender

## 2016-09-07 NOTE — Discharge Instructions (Signed)

## 2016-09-08 ENCOUNTER — Encounter (HOSPITAL_COMMUNITY): Payer: Self-pay | Admitting: Orthopedic Surgery

## 2016-09-08 LAB — CBC
HCT: 34.1 % — ABNORMAL LOW (ref 36.0–46.0)
Hemoglobin: 11.5 g/dL — ABNORMAL LOW (ref 12.0–15.0)
MCH: 30.5 pg (ref 26.0–34.0)
MCHC: 33.7 g/dL (ref 30.0–36.0)
MCV: 90.5 fL (ref 78.0–100.0)
Platelets: 276 10*3/uL (ref 150–400)
RBC: 3.77 MIL/uL — ABNORMAL LOW (ref 3.87–5.11)
RDW: 13.4 % (ref 11.5–15.5)
WBC: 13 10*3/uL — ABNORMAL HIGH (ref 4.0–10.5)

## 2016-09-08 LAB — BASIC METABOLIC PANEL
Anion gap: 9 (ref 5–15)
BUN: 11 mg/dL (ref 6–20)
CALCIUM: 8.4 mg/dL — AB (ref 8.9–10.3)
CO2: 23 mmol/L (ref 22–32)
CREATININE: 0.51 mg/dL (ref 0.44–1.00)
Chloride: 102 mmol/L (ref 101–111)
GFR calc Af Amer: >60 mL/min (ref >60)
Glucose, Bld: 134 mg/dL — ABNORMAL HIGH (ref 65–99)
Potassium: 4.8 mmol/L (ref 3.5–5.1)
Sodium: 134 mmol/L — ABNORMAL LOW (ref 135–145)

## 2016-09-08 LAB — GLUCOSE, CAPILLARY
GLUCOSE-CAPILLARY: 114 mg/dL — AB (ref 65–99)
GLUCOSE-CAPILLARY: 157 mg/dL — AB (ref 65–99)

## 2016-09-08 MED ORDER — HYDROCODONE-ACETAMINOPHEN 5-325 MG PO TABS: 1.0000 | ORAL_TABLET | ORAL | 0 refills | Status: DC | PRN

## 2016-09-08 NOTE — Progress Notes (Addendum)
   PATIENT ID: Katherine [name]   1 Day Post-Op Procedure(s) (LRB): REVERSE SHOULDER ARTHROPLASTY (Right)  Subjective: Doing well. Minimal pain. No complaints.   Objective:  Vitals:   09/07/16 2006 09/08/16 0400  BP: (!) 149/96 120/68  Pulse: 64 74  Resp: 14 18  Temp: 98.5 F (36.9 C) 97.6 F (36.4 C)  SpO2: 100% 98%     R shoulder dressing c/d/i Wiggles fingers, distally NVI  Labs:   Recent Labs  09/08/16 0424  HGB 11.5*   Recent Labs  09/08/16 0424  WBC 13.0*  RBC 3.77*  HCT 34.1*  PLT 276   Recent Labs  09/08/16 0424  NA 134*  K 4.8  CL 102  CO2 23  BUN 11  CREATININE 0.51  GLUCOSE 134*  CALCIUM 8.4*    Assessment and Plan: 1 day s/p left reverse TSA D/c today when cleared by OT Scripts signed in chart Fu w. Dr. Ave Filterhandler in 2 weeks  VTE proph: ASA, SCDs

## 2016-09-08 NOTE — Discharge Summary (Signed)
Patient ID: Katherine Queenamela A Volker MRN: [id number] DOB/AGE: 71/03/1945 71 y.o.  Admit date: 09/07/2016 Discharge date: 09/08/2016  Admission Diagnoses:  Active Problems:   S/P reverse total shoulder arthroplasty, right   Discharge Diagnoses:  Same  Past Medical History:  Diagnosis Date  . ABDOMINAL PAIN, LOWER 02/04/2010  . ABNORMAL THYROID FUNCTION TESTS 08/04/2009  . ALLERGIC RHINITIS 09/05/2006  . ANXIETY 07/31/2007  . Aortic stenosis    [facility]; 06/14/16 EF 55-60%, mod-severe AS, peak/mean grad 26.46 mmHg/ 16.31 mmHg, AVA by continuity eq 0.90 cm2  . Blood transfusion   . Cataract    resolved -surgery done  . DEPRESSION 09/05/2006  . DIABETES MELLITUS, TYPE II 07/31/2007   no meds  . Diabetic foot ulcer (HCC) 10/26/2014  . DIVERTICULOSIS, COLON 09/05/2006  . Dyspnea   . Dysrhythmia    patient had a "irregular beat that is being monitored by pcp"  . Dysuria 04/15/2010  . Failure to thrive in childhood 04/06/2010  . FATIGUE 04/15/2010  . FIBROMYALGIA 05/10/2009  . FRACTURE, PELVIS, RIGHT 04/15/2010  . GASTROENTERITIS, ACUTE 12/25/2008  . GERD 09/05/2006  . Heart murmur   . HYPERLIPIDEMIA 09/05/2006  . HYPERTENSION 09/05/2006  . KNEE PAIN, LEFT 02/04/2010  . MRSA infection 10/26/2014  . OSTEOARTHRITIS 09/05/2006  . OSTEOPOROSIS 07/31/2007  . Other specified forms of hearing loss 11/01/2009  . SINUSITIS- ACUTE-NOS 10/09/2007  . SKIN LESION 05/10/2009  . SPRAIN&STRAIN OTH SPEC SITES SHOULDER&UPPER ARM 11/01/2009  . Unspecified hearing loss 10/09/2007  . URI 02/20/2007  . UTI 02/20/2007    Surgeries: Procedure(s): REVERSE SHOULDER ARTHROPLASTY on 09/07/2016   Consultants:   Discharged Condition: Improved  Hospital Course: Katherine [name] is an 71 y.o. female who was admitted 09/07/2016 for operative treatment of right rotator cuff tear arthropathy. Patient has severe unremitting pain that affects sleep, daily activities, and work/hobbies. After pre-op clearance the patient was taken  to the operating room on 09/07/2016 and underwent  Procedure(s): REVERSE SHOULDER ARTHROPLASTY.    Patient was given perioperative antibiotics: Anti-infectives    Start     Dose/Rate Route Frequency Ordered Stop   09/07/16 1200  clindamycin (CLEOCIN) IVPB 600 mg     600 mg 100 mL/hr over 30 Minutes Intravenous Every 6 hours 09/07/16 1045 09/08/16 0030   09/07/16 0715  vancomycin (VANCOCIN) IVPB 1000 mg/200 mL premix     1,000 mg 200 mL/hr over 60 Minutes Intravenous On call to O.R. 09/07/16 [id number] 09/07/16 0805       Patient was given sequential compression devices, early ambulation, and asa to prevent DVT.  Patient benefited maximally from hospital stay and there were no complications.    Recent vital signs: Patient Vitals for the past 24 hrs:  BP Temp Temp src Pulse Resp SpO2  09/08/16 0400 120/68 97.6 F (36.4 C) Oral 74 18 98 %  09/07/16 2006 (!) 149/96 98.5 F (36.9 C) Oral 64 14 100 %  09/07/16 1440 (!) 144/80 (!) 97.5 F (36.4 C) Oral 67 14 96 %  09/07/16 1042 (!) 160/75 (!) 97.5 F (36.4 C) Oral 70 14 96 %  09/07/16 1022 115/73 97.8 F (36.6 C) - 64 13 97 %  09/07/16 1010 125/67 - - 65 13 97 %  09/07/16 0955 (!) 158/76 - - 72 19 95 %  09/07/16 0940 (!) 157/73 - - 83 20 96 %  09/07/16 0926 (!) 161/81 97.7 F (36.5 C) - 74 14 94 %     Recent laboratory studies:  Recent Labs  09/08/16 0424  WBC 13.0*  HGB 11.5*  HCT 34.1*  PLT 276  NA 134*  K 4.8  CL 102  CO2 23  BUN 11  CREATININE 0.51  GLUCOSE 134*  CALCIUM 8.4*     Discharge Medications:   Allergies as of 09/08/2016      Reactions   Penicillins Other (See Comments)   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 PATIENT HAS HAD A PCN REACTION WITH IMMEDIATE RASH, FACIAL/TONGUE/THROAT SWELLING, SOB, OR LIGHTHEADEDNESS WITH HYPOTENSION:  #  #  #  YES  #  #  #   Has patient had a PCN reaction causing severe rash involving mucus membranes or skin necrosis: Unknown Has patient had a PCN reaction that required  hospitalization: Unknown Has patient had a PCN reaction occurring within the last 10 years: Unknown   Coconut Oil    UNSPECIFIED REACTION    Pollen Extract    UNSPECIFIED REACTION    Mellaril [thioridazine] Itching   Thioridazine Hcl Itching      Medication List    STOP taking these medications   celecoxib 200 MG capsule Commonly known as:  CELEBREX     TAKE these medications   aspirin EC 81 MG tablet Take 81 mg by mouth 2 (two) times daily.   benztropine 0.5 MG tablet Commonly known as:  COGENTIN Take 0.5 mg by mouth 2 (two) times daily.   calcium-vitamin D 500-200 MG-UNIT tablet Commonly known as:  OSCAL WITH D Take 1 tablet by mouth 3 (three) times daily. What changed:  when to take this   cetirizine 10 MG tablet Commonly known as:  ZYRTEC TAKE 1 TABLET BY MOUTH DAILY   gabapentin 100 MG capsule Commonly known as:  NEURONTIN Take 100 mg by mouth 3 (three) times daily.   HYDROcodone-acetaminophen 5-325 MG tablet Commonly known as:  NORCO Take 1-2 tablets by mouth every 4 (four) hours as needed for moderate pain. What changed:  how much to take  when to take this   hydrOXYzine 10 MG tablet Commonly known as:  ATARAX/VISTARIL Take 1 tablet (10 mg total) by mouth 2 (two) times daily as needed. What changed:  when to take this   ipratropium 0.06 % nasal spray Commonly known as:  ATROVENT USE 1 SPRAY IN EACH NOSTRIL THREE TIMES DAILY What changed:  See the new instructions.   metFORMIN 500 MG tablet Commonly known as:  GLUCOPHAGE Take 500 mg by mouth 2 (two) times daily.   multivitamin with minerals Tabs tablet Take 1 tablet by mouth daily.   omega-3 acid ethyl esters 1 g capsule Commonly known as:  LOVAZA Take 1 g by mouth daily.   ONETOUCH DELICA LANCETS 33G Misc CHECK BLOOD SUGAR ONCE DAILY AS DIRECTED   ONETOUCH VERIO test strip Generic drug:  glucose blood 1 each by Other route 2 (two) times daily. Use to check blood sugars twice a day Dx  E11.9   pantoprazole 40 MG tablet Commonly known as:  PROTONIX TAKE 1 TABLET BY MOUTH DAILY   Potassium Chloride ER 20 MEQ Tbcr Take 20 mEq by mouth 2 (two) times daily.   pravastatin 20 MG tablet Commonly known as:  PRAVACHOL Take 20 mg by mouth daily.   risperiDONE 3 MG tablet Commonly known as:  RISPERDAL Take 3 mg by mouth at bedtime.   traMADol 50 MG tablet Commonly known as:  ULTRAM Take 50 mg by mouth every 6 (six) hours as needed (for pain.).   UREA  20 INTENSIVE HYDRATING 20 % cream Generic drug:  urea Apply 1 application topically daily as needed. For dry skin       Diagnostic Studies: Dg Shoulder Right Port  Result Date: 09/07/2016 CLINICAL DATA:  S/P reverse total shoulder arthroplasty, right [id number] AP neutral only EXAM: PORTABLE RIGHT SHOULDER COMPARISON:  01/17/2016 FINDINGS: Interval arthroplasty, with humeral and glenoid components projecting in expected location on this single projection. No fracture or dislocation. Low lung volumes with some patchy atelectasis or infiltrate at the right lung base. IMPRESSION: Right shoulder arthroplasty without apparent complication. Electronically Signed   By: Corlis Leak M.D.   On: 09/07/2016 09:53    Disposition: 01-Home or Self Care  Discharge Instructions    Call MD / Call 911    Complete by:  As directed    If you experience chest pain or shortness of breath, CALL 911 and be transported to the hospital emergency room.  If you develope a fever above 101 F, pus (white drainage) or increased drainage or redness at the wound, or calf pain, call your surgeon's office.   Constipation Prevention    Complete by:  As directed    Drink plenty of fluids.  Prune juice may be helpful.  You may use a stool softener, such as Colace (over the counter) 100 mg twice a day.  Use MiraLax (over the counter) for constipation as needed.   Diet - low sodium heart healthy    Complete by:  As directed    Increase activity slowly as tolerated     Complete by:  As directed       Follow-up Information    Jones Broom, MD. Schedule an appointment as soon as possible for a visit in 2 weeks.   Specialty:  Orthopedic Surgery Contact information: [address] [place] [address] [phone]            Signed: Jiles Harold 09/08/2016, 8:28 AM

## 2016-09-08 NOTE — Progress Notes (Signed)
Pt discharged to home in stable condition, all discharge instructions and Rx's x1 reviewed with pt and pastor's wife.  Pt transported via wheelchair with transporter x 1, all belongings sent with pt. [id number]

## 2016-09-08 NOTE — Discharge Summary (Deleted)
Patient ID: Katherine [name] MRN: [id number] DOB/AGE: 71/03/1945 [age] y.o.  Admit date: 09/07/2016 Discharge date: 09/08/2016  Admission Diagnoses:  Active Problems:   S/P reverse total shoulder arthroplasty, right   Discharge Diagnoses:  Same  Past Medical History:  Diagnosis Date  . ABDOMINAL PAIN, LOWER 02/04/2010  . ABNORMAL THYROID FUNCTION TESTS 08/04/2009  . ALLERGIC RHINITIS 09/05/2006  . ANXIETY 07/31/2007  . Aortic stenosis    [facility]; 06/14/16 EF 55-60%, mod-severe AS, peak/mean grad 26.46 mmHg/ 16.31 mmHg, AVA by continuity eq 0.90 cm2  . Blood transfusion   . Cataract    resolved -surgery done  . DEPRESSION 09/05/2006  . DIABETES MELLITUS, TYPE II 07/31/2007   no meds  . Diabetic foot ulcer (HCC) 10/26/2014  . DIVERTICULOSIS, COLON 09/05/2006  . Dyspnea   . Dysrhythmia    patient had a "irregular beat that is being monitored by pcp"  . Dysuria 04/15/2010  . Failure to thrive in childhood 04/06/2010  . FATIGUE 04/15/2010  . FIBROMYALGIA 05/10/2009  . FRACTURE, PELVIS, RIGHT 04/15/2010  . GASTROENTERITIS, ACUTE 12/25/2008  . GERD 09/05/2006  . Heart murmur   . HYPERLIPIDEMIA 09/05/2006  . HYPERTENSION 09/05/2006  . KNEE PAIN, LEFT 02/04/2010  . MRSA infection 10/26/2014  . OSTEOARTHRITIS 09/05/2006  . OSTEOPOROSIS 07/31/2007  . Other specified forms of hearing loss 11/01/2009  . SINUSITIS- ACUTE-NOS 10/09/2007  . SKIN LESION 05/10/2009  . SPRAIN&STRAIN OTH SPEC SITES SHOULDER&UPPER ARM 11/01/2009  . Unspecified hearing loss 10/09/2007  . URI 02/20/2007  . UTI 02/20/2007    Surgeries: Procedure(s): REVERSE SHOULDER ARTHROPLASTY on 09/07/2016   Consultants:   Discharged Condition: Improved  Hospital Course: Katherine [name] is an 71 y.o. female who was admitted 09/07/2016 for operative treatment of revision left shoulder hemiarthroplasty to reverse total shoulder arthroplasty. Patient has severe unremitting pain that affects sleep, daily activities, and work/hobbies.  After pre-op clearance the patient was taken to the operating room on 09/07/2016 and underwent  Procedure(s): REVERSE SHOULDER ARTHROPLASTY.    Patient was given perioperative antibiotics: Anti-infectives    Start     Dose/Rate Route Frequency Ordered Stop   09/07/16 1200  clindamycin (CLEOCIN) IVPB 600 mg     600 mg 100 mL/hr over 30 Minutes Intravenous Every 6 hours 09/07/16 1045 09/08/16 0030   09/07/16 0715  vancomycin (VANCOCIN) IVPB 1000 mg/200 mL premix     1,000 mg 200 mL/hr over 60 Minutes Intravenous On call to O.R. 09/07/16 [id number] 09/07/16 0805       Patient was given sequential compression devices, early ambulation, and asa to prevent DVT.  Patient benefited maximally from hospital stay and there were no complications.  Intraoperative cultures were obtained to r/o indolent infection, likelihood low, will tx with doxy x 2 weeks.  Recent vital signs: Patient Vitals for the past 24 hrs:  BP Temp Temp src Pulse Resp SpO2  09/08/16 0400 120/68 97.6 F (36.4 C) Oral 74 18 98 %  09/07/16 2006 (!) 149/96 98.5 F (36.9 C) Oral 64 14 100 %  09/07/16 1440 (!) 144/80 (!) 97.5 F (36.4 C) Oral 67 14 96 %  09/07/16 1042 (!) 160/75 (!) 97.5 F (36.4 C) Oral 70 14 96 %  09/07/16 1022 115/73 97.8 F (36.6 C) - 64 13 97 %  09/07/16 1010 125/67 - - 65 13 97 %  09/07/16 0955 (!) 158/76 - - 72 19 95 %  09/07/16 0940 (!) 157/73 - - 83 20 96 %  09/07/16 0926 (!) 161/81 97.7 F (36.5 C) - 74 14 94 %     Recent laboratory studies:  Recent Labs  09/08/16 0424  WBC 13.0*  HGB 11.5*  HCT 34.1*  PLT 276  NA 134*  K 4.8  CL 102  CO2 23  BUN 11  CREATININE 0.51  GLUCOSE 134*  CALCIUM 8.4*     Discharge Medications:   Allergies as of 09/08/2016      Reactions   Penicillins Other (See Comments)   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 PATIENT HAS HAD A PCN REACTION WITH IMMEDIATE RASH, FACIAL/TONGUE/THROAT SWELLING, SOB, OR LIGHTHEADEDNESS WITH HYPOTENSION:  #  #  #  YES  #  #  #    Has patient had a PCN reaction causing severe rash involving mucus membranes or skin necrosis: Unknown Has patient had a PCN reaction that required hospitalization: Unknown Has patient had a PCN reaction occurring within the last 10 years: Unknown   Coconut Oil    UNSPECIFIED REACTION    Pollen Extract    UNSPECIFIED REACTION    Mellaril [thioridazine] Itching   Thioridazine Hcl Itching      Medication List    STOP taking these medications   celecoxib 200 MG capsule Commonly known as:  CELEBREX     TAKE these medications   aspirin EC 81 MG tablet Take 81 mg by mouth 2 (two) times daily.   benztropine 0.5 MG tablet Commonly known as:  COGENTIN Take 0.5 mg by mouth 2 (two) times daily.   calcium-vitamin D 500-200 MG-UNIT tablet Commonly known as:  OSCAL WITH D Take 1 tablet by mouth 3 (three) times daily. What changed:  when to take this   cetirizine 10 MG tablet Commonly known as:  ZYRTEC TAKE 1 TABLET BY MOUTH DAILY   gabapentin 100 MG capsule Commonly known as:  NEURONTIN Take 100 mg by mouth 3 (three) times daily.   HYDROcodone-acetaminophen 5-325 MG tablet Commonly known as:  NORCO Take 1-2 tablets by mouth every 4 (four) hours as needed for moderate pain. What changed:  how much to take  when to take this   hydrOXYzine 10 MG tablet Commonly known as:  ATARAX/VISTARIL Take 1 tablet (10 mg total) by mouth 2 (two) times daily as needed. What changed:  when to take this   ipratropium 0.06 % nasal spray Commonly known as:  ATROVENT USE 1 SPRAY IN EACH NOSTRIL THREE TIMES DAILY What changed:  See the new instructions.   metFORMIN 500 MG tablet Commonly known as:  GLUCOPHAGE Take 500 mg by mouth 2 (two) times daily.   multivitamin with minerals Tabs tablet Take 1 tablet by mouth daily.   omega-3 acid ethyl esters 1 g capsule Commonly known as:  LOVAZA Take 1 g by mouth daily.   ONETOUCH DELICA LANCETS 33G Misc CHECK BLOOD SUGAR ONCE DAILY AS  DIRECTED   ONETOUCH VERIO test strip Generic drug:  glucose blood 1 each by Other route 2 (two) times daily. Use to check blood sugars twice a day Dx E11.9   pantoprazole 40 MG tablet Commonly known as:  PROTONIX TAKE 1 TABLET BY MOUTH DAILY   Potassium Chloride ER 20 MEQ Tbcr Take 20 mEq by mouth 2 (two) times daily.   pravastatin 20 MG tablet Commonly known as:  PRAVACHOL Take 20 mg by mouth daily.   risperiDONE 3 MG tablet Commonly known as:  RISPERDAL Take 3 mg by mouth at bedtime.   traMADol 50 MG tablet  Commonly known as:  ULTRAM Take 50 mg by mouth every 6 (six) hours as needed (for pain.).   UREA 20 INTENSIVE HYDRATING 20 % cream Generic drug:  urea Apply 1 application topically daily as needed. For dry skin       Diagnostic Studies: Dg Shoulder Right Port  Result Date: 09/07/2016 CLINICAL DATA:  S/P reverse total shoulder arthroplasty, right [id number] AP neutral only EXAM: PORTABLE RIGHT SHOULDER COMPARISON:  01/17/2016 FINDINGS: Interval arthroplasty, with humeral and glenoid components projecting in expected location on this single projection. No fracture or dislocation. Low lung volumes with some patchy atelectasis or infiltrate at the right lung base. IMPRESSION: Right shoulder arthroplasty without apparent complication. Electronically Signed   By: Corlis Leak M.D.   On: 09/07/2016 09:53    Disposition: 01-Home or Self Care  Discharge Instructions    Call MD / Call 911    Complete by:  As directed    If you experience chest pain or shortness of breath, CALL 911 and be transported to the hospital emergency room.  If you develope a fever above 101 F, pus (white drainage) or increased drainage or redness at the wound, or calf pain, call your surgeon's office.   Constipation Prevention    Complete by:  As directed    Drink plenty of fluids.  Prune juice may be helpful.  You may use a stool softener, such as Colace (over the counter) 100 mg twice a day.  Use MiraLax  (over the counter) for constipation as needed.   Diet - low sodium heart healthy    Complete by:  As directed    Increase activity slowly as tolerated    Complete by:  As directed       Follow-up Information    Jones Broom, MD. Schedule an appointment as soon as possible for a visit in 2 weeks.   Specialty:  Orthopedic Surgery Contact information: [address] [place] [address] [phone]            Signed: Jiles Harold 09/08/2016, 8:23 AM

## 2016-09-08 NOTE — Evaluation (Addendum)
Occupational Therapy Evaluation and Discharge Patient Details Name: Katherine [name] MRN: [id number] DOB: 12/22/1945 Today's Date: 09/08/2016    History of Present Illness right REVERSE SHOULDER ARTHROPLASTY; PMHx: anxiety, depression, DM, fibromyalgia, HTN   Clinical Impression   This 71 yo female admitted and underwent above presents to acute OT with all OT education completed and handout provided. HHOT follow up recommended.    Follow Up Recommendations  Home health OT;Supervision/Assistance - 24 hour    Equipment Recommendations  None recommended by OT       Precautions / Restrictions Precautions Precautions: Fall;Shoulder Type of Shoulder Precautions: Elbow, wrist , and hand movements only Shoulder Interventions: Shoulder sling/immobilizer;Off for dressing/bathing/exercises Restrictions Weight Bearing Restrictions: Yes RUE Weight Bearing: Non weight bearing      Mobility Bed Mobility Overal bed mobility: Needs Assistance Bed Mobility: Rolling;Sidelying to Sit Rolling: Supervision (no rail) Sidelying to sit: Min assist       General bed mobility comments: needed A to lift up trunk  Transfers Overall transfer level: Needs assistance Equipment used: None Transfers: Sit to/from Stand Sit to Stand: Min guard              Balance Overall balance assessment: Needs assistance Sitting-balance support: Feet supported;No upper extremity supported Sitting balance-Leahy Scale: Good     Standing balance support: Single extremity supported;During functional activity Standing balance-Leahy Scale: Poor                             ADL either performed or assessed with clinical judgement   ADL Overall ADL's : Needs assistance/impaired Eating/Feeding: Set up;Sitting   Grooming: Moderate assistance Grooming Details (indicate cue type and reason): min guard A standing Upper Body Bathing: Moderate assistance;Sitting   Lower Body Bathing: Maximal  assistance Lower Body Bathing Details (indicate cue type and reason): min guard A sit<>stand Upper Body Dressing : Maximal assistance;Sitting   Lower Body Dressing: Total assistance Lower Body Dressing Details (indicate cue type and reason): min guard A sit<>stand Toilet Transfer: Min guard;Ambulation;Comfort height toilet;Grab bars   Toileting- Clothing Manipulation and Hygiene: Minimal assistance Toileting - Clothing Manipulation Details (indicate cue type and reason): min guard a sit<>stand       General ADL Comments: Post op shoulder handout with general instructions as well as elbow,forearm, wrist exercises provided     Vision Baseline Vision/History: Wears glasses Wears Glasses: Reading only              Pertinent Vitals/Pain Pain Assessment: 0-10 Pain Score: 5  Pain Location: left shoulder Pain Descriptors / Indicators: Aching;Sore Pain Intervention(s): Limited activity within patient's tolerance;Monitored during session;Repositioned     Hand Dominance Right   Extremity/Trunk Assessment Upper Extremity Assessment RUE Deficits / Details: Shoulder sx this admission; elbow, wrist, hand WNL RUE Coordination: decreased gross motor LUE Deficits / Details: Decreased AROM shoulder LUE Coordination: decreased gross motor           Communication Communication Communication: No difficulties   Cognition Arousal/Alertness: Awake/alert Behavior During Therapy: WFL for tasks assessed/performed Overall Cognitive Status: Within Functional Limits for tasks assessed                                        Exercises Other Exercises Other Exercises: Pt educated and returned demo of elbow, forearm, wrist , and hand exercises. (10 reps each); pt aware she is  not suppose to move right shoulder but hard for her to remember due to she is right handed        Home Living Family/patient expects to be discharged to:: Private residence Living Arrangements:  Alone Available Help at Discharge: Family;Friend(s);Available PRN/intermittently Type of Home: Apartment Home Access: Stairs to enter Entrance Stairs-Number of Steps: 4 Entrance Stairs-Rails:  (rail on 2 of them) Home Layout: One level     Bathroom Shower/Tub: Tub/shower unit;Door   [employer] Toilet: Standard     Home Equipment: Environmental consultant - 2 wheels;Shower seat;Cane - single point;Bedside commode   Additional Comments: Pt reports she has PCA 1-2 hours/day 6 days a week, assists with breakfast and cleaning and B/D      Prior Functioning/Environment Level of Independence: Needs assistance  Gait / Transfers Assistance Needed: uses SPC and RW intermittently ADL's / Homemaking Assistance Needed: pt stated having an aide to assist with dressing, bathing, cooking and housework            OT Problem List: Decreased range of motion;Impaired balance (sitting and/or standing);Obesity;Pain         OT Goals(Current goals can be found in the care plan section) Acute Rehab OT Goals Patient Stated Goal: to go home  OT Frequency:                AM-PAC PT "6 Clicks" Daily Activity     Outcome Measure Help from another person eating meals?: A Little Help from another person taking care of personal grooming?: A Lot Help from another person toileting, which includes using toliet, bedpan, or urinal?: A Little Help from another person bathing (including washing, rinsing, drying)?: A Lot Help from another person to put on and taking off regular upper body clothing?: A Lot Help from another person to put on and taking off regular lower body clothing?: A Lot 6 Click Score: 14   End of Session Equipment Utilized During Treatment:  (shoulder sling) Nurse Communication:  (pt needs acute OT consult order and HHOT/PT order)  Activity Tolerance: Patient tolerated treatment well Patient left: in chair;with call bell/phone within reach;with chair alarm set  OT Visit Diagnosis: Unsteadiness on feet  (R26.81);Pain Pain - Right/Left: Right Pain - part of body: Shoulder                Time: 1610-9604 OT Time Calculation (min): 75 min Charges:  OT General Charges $OT Visit: 1 Procedure OT Evaluation $OT Eval Moderate Complexity: 1 Procedure OT Treatments $Self Care/Home Management : 38-52 mins $Therapeutic Exercise: 8-22 mins Ignacia Palma, OTR/L [phone] 09/08/2016

## 2016-09-08 NOTE — Progress Notes (Signed)
Orthopedic Tech Progress Note Patient Details:  Katherine [name] 08/28/1945 [id number]  Ortho Devices Type of Ortho Device: Arm sling Ortho Device/Splint Location: Pt already hand a sling immobilizer applied to arm.  Provided additional arm sling at bedside (placed in pt personal bag to take home). Ortho Device/Splint Interventions: Other (comment)   Alvina ChouWilliams, [place] C 09/08/2016, 1:21 PM

## 2016-10-20 ENCOUNTER — Emergency Department (HOSPITAL_COMMUNITY)
Admission: EM | Admit: 2016-10-20 | Discharge: 2016-10-20 | Disposition: A | Discharge status: Home / Self Care | Payer: Medicare Other | Attending: Emergency Medicine | Admitting: Emergency Medicine

## 2016-10-20 ENCOUNTER — Encounter (HOSPITAL_COMMUNITY): Payer: Self-pay | Admitting: Emergency Medicine

## 2016-10-20 DIAGNOSIS — Z5321 Procedure and treatment not carried out due to patient leaving prior to being seen by health care provider: Secondary | ICD-10-CM | POA: Diagnosis not present

## 2016-10-20 DIAGNOSIS — W19XXXA Unspecified fall, initial encounter: Secondary | ICD-10-CM | POA: Diagnosis not present

## 2016-10-20 DIAGNOSIS — M25569 Pain in unspecified knee: Secondary | ICD-10-CM | POA: Diagnosis present

## 2016-10-20 NOTE — ED Triage Notes (Signed)
Per EMS-mechanical fall-tripped while walking on side walk-abrasion to both knees-no LOC, no deformities-

## 2016-10-20 NOTE — ED Notes (Signed)
Pt called x2, no response from lobby 

## 2016-10-20 NOTE — ED Notes (Signed)
Pt called for v/s recheck, no response from lobby 

## 2016-11-14 ENCOUNTER — Other Ambulatory Visit: Payer: Self-pay | Admitting: Cardiology

## 2016-11-14 DIAGNOSIS — Z1231 Encounter for screening mammogram for malignant neoplasm of breast: Secondary | ICD-10-CM

## 2016-11-17 ENCOUNTER — Encounter (HOSPITAL_COMMUNITY): Payer: Self-pay | Admitting: Emergency Medicine

## 2016-11-17 ENCOUNTER — Emergency Department (HOSPITAL_COMMUNITY): Payer: Medicare Other

## 2016-11-17 ENCOUNTER — Emergency Department (HOSPITAL_COMMUNITY)
Admission: EM | Admit: 2016-11-17 | Discharge: 2016-11-17 | Disposition: A | Discharge status: Home / Self Care | Payer: Medicare Other | Attending: Emergency Medicine | Admitting: Emergency Medicine

## 2016-11-17 DIAGNOSIS — Y999 Unspecified external cause status: Secondary | ICD-10-CM | POA: Insufficient documentation

## 2016-11-17 DIAGNOSIS — Y9301 Activity, walking, marching and hiking: Secondary | ICD-10-CM | POA: Insufficient documentation

## 2016-11-17 DIAGNOSIS — I10 Essential (primary) hypertension: Secondary | ICD-10-CM | POA: Insufficient documentation

## 2016-11-17 DIAGNOSIS — E119 Type 2 diabetes mellitus without complications: Secondary | ICD-10-CM | POA: Diagnosis not present

## 2016-11-17 DIAGNOSIS — Z7984 Long term (current) use of oral hypoglycemic drugs: Secondary | ICD-10-CM | POA: Diagnosis not present

## 2016-11-17 DIAGNOSIS — Z7982 Long term (current) use of aspirin: Secondary | ICD-10-CM | POA: Insufficient documentation

## 2016-11-17 DIAGNOSIS — W0110XA Fall on same level from slipping, tripping and stumbling with subsequent striking against unspecified object, initial encounter: Secondary | ICD-10-CM | POA: Insufficient documentation

## 2016-11-17 DIAGNOSIS — Z7902 Long term (current) use of antithrombotics/antiplatelets: Secondary | ICD-10-CM | POA: Insufficient documentation

## 2016-11-17 DIAGNOSIS — Y929 Unspecified place or not applicable: Secondary | ICD-10-CM | POA: Diagnosis not present

## 2016-11-17 DIAGNOSIS — W19XXXA Unspecified fall, initial encounter: Secondary | ICD-10-CM

## 2016-11-17 DIAGNOSIS — M25561 Pain in right knee: Secondary | ICD-10-CM | POA: Diagnosis not present

## 2016-11-17 DIAGNOSIS — S0083XA Contusion of other part of head, initial encounter: Secondary | ICD-10-CM | POA: Diagnosis not present

## 2016-11-17 DIAGNOSIS — K0889 Other specified disorders of teeth and supporting structures: Secondary | ICD-10-CM | POA: Insufficient documentation

## 2016-11-17 DIAGNOSIS — M25562 Pain in left knee: Secondary | ICD-10-CM | POA: Diagnosis not present

## 2016-11-17 DIAGNOSIS — S0990XA Unspecified injury of head, initial encounter: Secondary | ICD-10-CM | POA: Diagnosis present

## 2016-11-17 MED ORDER — ACETAMINOPHEN 325 MG PO TABS
650.0000 mg | ORAL_TABLET | Freq: Once | ORAL | Status: AC
  Administered 2016-11-17: 650 mg via ORAL
  Filled 2016-11-17: qty 2

## 2016-11-17 NOTE — ED Notes (Signed)
Bed: [facility] Expected date:  Expected time:  Means of arrival:  Comments: 

## 2016-11-17 NOTE — ED Triage Notes (Signed)
Patient brought in by [facility] for falling over door step coming in front of house today. patient hit lower part of face and bilat knees when fell. Patient denies any LOC or taking blood thinners. Patient uses cane with ambulation assistance.

## 2016-11-17 NOTE — Discharge Instructions (Signed)
Use tylenol as needed for pain.  Use ice packs on your knees if it helps control your pain.  Follow up with your primary care doctor for evaluation of your symptoms.  Return to the ER if you develop vision changes, vomiting, loss of bowel or bladder control, or any new or worsening symptoms.

## 2016-11-17 NOTE — ED Provider Notes (Signed)
[facility]-EMERGENCY DEPT Provider Note   CSN: [id number] Arrival date & time: 11/17/16  1617     History   Chief Complaint Chief Complaint  Patient presents with  . Fall  . Knee Pain    bilat  . Dental Pain    HPI Katherine [name] is a 71 y.o. female presenting with bilateral knee pain, facial bruising, low back pain after a fall.  Pt states that she tripped walking in the door of her house today.  She denies loss of consciousness.  She is not on blood thinners.  She landed on her knees, and hit her face against the door.  She was unable to get herself up, needed to call EMS.  With EMS, she walked from the doorway to the stretcher without difficulty.  She denies numbness or tingling.  She reports pain at the anterior aspect of both knees.  She has pain on her chin and inside of her lip.  She has not tried anything to make it better, palpation makes the pain worse.  She denies headache, neck pain, vision changes, slurred speech, neck pain, upper back pain, numbness, or tingling.  She denies pain with movement of her eyes.  She denies chest pain, shortness of breath, nausea, vomiting, or abdominal pain.  She denies hip pain.   HPI  Past Medical History:  Diagnosis Date  . ABDOMINAL PAIN, LOWER 02/04/2010  . ABNORMAL THYROID FUNCTION TESTS 08/04/2009  . ALLERGIC RHINITIS 09/05/2006  . ANXIETY 07/31/2007  . Aortic stenosis    [facility]; 06/14/16 EF 55-60%, mod-severe AS, peak/mean grad 26.46 mmHg/ 16.31 mmHg, AVA by continuity eq 0.90 cm2  . Blood transfusion   . Cataract    resolved -surgery done  . DEPRESSION 09/05/2006  . DIABETES MELLITUS, TYPE II 07/31/2007   no meds  . Diabetic foot ulcer (HCC) 10/26/2014  . DIVERTICULOSIS, COLON 09/05/2006  . Dyspnea   . Dysrhythmia    patient had a "irregular beat that is being monitored by pcp"  . Dysuria 04/15/2010  . Failure to thrive in childhood 04/06/2010  . FATIGUE 04/15/2010  . FIBROMYALGIA  05/10/2009  . FRACTURE, PELVIS, RIGHT 04/15/2010  . GASTROENTERITIS, ACUTE 12/25/2008  . GERD 09/05/2006  . Heart murmur   . HYPERLIPIDEMIA 09/05/2006  . HYPERTENSION 09/05/2006  . KNEE PAIN, LEFT 02/04/2010  . MRSA infection 10/26/2014  . OSTEOARTHRITIS 09/05/2006  . OSTEOPOROSIS 07/31/2007  . Other specified forms of hearing loss 11/01/2009  . SINUSITIS- ACUTE-NOS 10/09/2007  . SKIN LESION 05/10/2009  . SPRAIN&STRAIN OTH SPEC SITES SHOULDER&UPPER ARM 11/01/2009  . Unspecified hearing loss 10/09/2007  . URI 02/20/2007  . UTI 02/20/2007    Patient Active Problem List   Diagnosis Date Noted  . 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 CAP (community acquired pneumonia) 09/23/2015  . Sepsis (HCC) 09/23/2015  . Hypernatremia 09/23/2015  . Dehydration 09/23/2015  . MRSA infection 10/26/2014  . Diabetic foot ulcer (HCC) 10/26/2014  . Pre-ulcerative corn or callous 10/09/2013  . UTI (urinary tract infection) 05/09/2013  . Hypokalemia 05/09/2013  . Altered mental status 04/29/2013  . Normocytic anemia 01/06/2013  . 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 Thyroid nodule 11/07/2012  . Aortic stenosis, mild 05/27/2012  . Heart murmur, systolic 05/09/2012  . Encounter for preventative adult health care exam with abnormal findings 10/07/2010  . FATIGUE 04/15/2010  . Dysuria 04/15/2010  . FRACTURE, PELVIS, RIGHT 04/15/2010  . Failure to thrive  in childhood 04/06/2010  . SPRAIN&STRAIN OTH SPEC SITES SHOULDER&UPPER ARM 11/01/2009  . ABNORMAL THYROID FUNCTION TESTS 08/04/2009  . SKIN LESION 05/10/2009  . Fibromyalgia 05/10/2009  . Anxiety state 07/31/2007  . Osteoporosis 07/31/2007  . Hyperlipidemia 09/05/2006  . DEPRESSION 09/05/2006  . Essential hypertension 09/05/2006  . Allergic rhinitis 09/05/2006  . GERD 09/05/2006  . DIVERTICULOSIS, COLON 09/05/2006  . OSTEOARTHRITIS 09/05/2006    Past Surgical History:  Procedure Laterality Date  . ABDOMINAL HYSTERECTOMY   12/08   Bladder tac, partial hysterectomy  . BREAST BIOPSY    . catarct removal     both eyes  . COLONOSCOPY    . I&D KNEE WITH POLY EXCHANGE Left 01/03/2013   Procedure: IRRIGATION AND DEBRIDEMENT LEFT KNEE WITH POLY EXCHANGE;  Surgeon: Kathryne Hitch, MD;  Location: [facility];  Service: Orthopedics;  Laterality: Left;  . inguinal herniorrhapy     right Dr. Freida Busman 06/2009  . REVERSE SHOULDER ARTHROPLASTY Right 09/07/2016  . REVERSE SHOULDER ARTHROPLASTY Right 09/07/2016   Procedure: REVERSE SHOULDER ARTHROPLASTY;  Surgeon: Jones Broom, MD;  Location: [facility] OR;  Service: Orthopedics;  Laterality: Right;  Right reverse shoulder arthroplasty  . TONSILLECTOMY    . TOTAL KNEE ARTHROPLASTY Left 12/20/2012   Procedure: LEFT TOTAL KNEE ARTHROPLASTY;  Surgeon: Kathryne Hitch, MD;  Location: [facility];  Service: Orthopedics;  Laterality: Left;    OB History    No data available       Home Medications    Prior to Admission medications   Medication Sig Start Date End Date Taking? Authorizing Provider  aspirin EC 81 MG tablet Take 81 mg by mouth 2 (two) times daily.    [provider]  benztropine (COGENTIN) 0.5 MG tablet Take 0.5 mg by mouth 2 (two) times daily.      [provider]  calcium-vitamin D (OSCAL WITH D) 500-200 MG-UNIT tablet Take 1 tablet by mouth 3 (three) times daily. Patient taking differently: Take 1 tablet by mouth every other day.  02/25/15   Corwin Levins, MD  cetirizine (ZYRTEC) 10 MG tablet TAKE 1 TABLET BY MOUTH DAILY 11/02/15   Corwin Levins, MD  gabapentin (NEURONTIN) 100 MG capsule Take 100 mg by mouth 3 (three) times daily.  04/20/16   [provider]  glucose blood (ONETOUCH VERIO) test strip 1 each by Other route 2 (two) times daily. Use to check blood sugars twice a day Dx E11.9    [provider]  HYDROcodone-acetaminophen (NORCO) 5-325 MG tablet Take 1-2 tablets by mouth every 4 (four) hours as needed for moderate pain.  09/08/16   Jiles Harold, PA-C  hydrOXYzine (ATARAX/VISTARIL) 10 MG tablet Take 1 tablet (10 mg total) by mouth 2 (two) times daily as needed. Patient taking differently: Take 10 mg by mouth 2 (two) times daily.  12/11/13   Corwin Levins, MD  ipratropium (ATROVENT) 0.06 % nasal spray USE 1 SPRAY IN EACH NOSTRIL THREE TIMES DAILY Patient taking differently: USE 1 SPRAY IN EACH NOSTRIL THREE TIMES DAILY AS NEEDED FOR ALLERGIES/SINUS 07/26/15   Corwin Levins, MD  metFORMIN (GLUCOPHAGE) 500 MG tablet Take 500 mg by mouth 2 (two) times daily.  05/18/16   [provider]  Multiple Vitamin (MULTIVITAMIN WITH MINERALS) TABS tablet Take 1 tablet by mouth daily.    [provider]  omega-3 acid ethyl esters (LOVAZA) 1 g capsule Take 1 g by mouth daily.     [provider]  Dola Argyle LANCETS  33G MISC CHECK BLOOD SUGAR ONCE DAILY AS DIRECTED 06/23/15   Corwin Levins, MD  pantoprazole (PROTONIX) 40 MG tablet TAKE 1 TABLET BY MOUTH DAILY 11/16/15   Corwin Levins, MD  Potassium Chloride ER 20 MEQ TBCR Take 20 mEq by mouth 2 (two) times daily. 07/04/16   [provider]  pravastatin (PRAVACHOL) 20 MG tablet Take 20 mg by mouth daily. 08/03/16   [provider]  risperiDONE (RISPERDAL) 3 MG tablet Take 3 mg by mouth at bedtime.     [provider]  traMADol (ULTRAM) 50 MG tablet Take 50 mg by mouth every 6 (six) hours as needed (for pain.).  06/02/16   [provider]  UREA 20 INTENSIVE HYDRATING 20 % cream Apply 1 application topically daily as needed. For dry skin 08/07/16   [provider]    Family History Family History  Problem Relation Age of Onset  . Coronary artery disease Other   . Diabetes Other   . Colon cancer Neg Hx   . Esophageal cancer Neg Hx   . Stomach cancer Neg Hx   . Rectal cancer Neg Hx     Social History Social History  Substance Use Topics  . Smoking status: Never Smoker  . Smokeless tobacco: Never Used  .  Alcohol use No     Allergies   Penicillins; Coconut oil; Pollen extract; Mellaril [thioridazine]; and Thioridazine hcl   Review of Systems Review of Systems  HENT: Positive for facial swelling.   Musculoskeletal: Positive for arthralgias and back pain.  Skin: Positive for wound.  Hematological: Does not bruise/bleed easily.     Physical Exam Updated Vital Signs BP (!) 160/89   Pulse 70   Temp 98.4 F (36.9 C) (Oral)   Resp 18   SpO2 99%   Physical Exam  Constitutional: She is oriented to person, place, and time. She appears well-developed and well-nourished. No distress.  HENT:  Head: Normocephalic. Head is with contusion.    Nose: Nose normal.  Mouth/Throat: Uvula is midline, oropharynx is clear and moist and mucous membranes are normal. Lacerations present.  Patient with small contusion of right brow, and chin.  Laceration on inner lower lip.  Not actively bleeding, approximately 0.5 cm log and 1 mm deep.  Eyes: Pupils are equal, round, and reactive to light. Conjunctivae and EOM are normal.  Neck: Normal range of motion.  No tenderness to palpation of midline cervical spine.  No pain of surrounding neck musculature.  Full active range of motion of head without pain.  Cardiovascular: Normal rate, regular rhythm and intact distal pulses.   Pulmonary/Chest: Effort normal and breath sounds normal. No respiratory distress. She has no wheezes.  Abdominal: Soft. She exhibits no distension. There is no tenderness.  Musculoskeletal: Normal range of motion.  Tenderness to palpation of low back, patient is not sure if this feels different than normal. Minor abrasions without bleeding on bilateral knees.  Tenderness to palpation of anterior knees.  No tenderness to palpation of bilateral joint lines.  Pedal pulses intact bilaterally.  Strength of lower extremities equal bilaterally.  Sensation intact bilaterally.  Full active range of motion of bilateral upper extremities without  difficulty.  Radial pulses intact bilaterally.  Strength equal bilaterally.  Sensation intact bilaterally  Neurological: She is alert and oriented to person, place, and time. She has normal strength. No cranial nerve deficit or sensory deficit. GCS eye subscore is 4. GCS verbal subscore is 5. GCS motor subscore is  6.  Skin: Skin is warm and dry.  Psychiatric: She has a normal mood and affect.  Nursing note and vitals reviewed.    ED Treatments / Results  Labs (all labs ordered are listed, but only abnormal results are displayed) Labs Reviewed - No data to display  EKG  EKG Interpretation None       Radiology Dg Lumbar Spine Complete  Result Date: 11/17/2016 CLINICAL DATA:  Low back pain after a fall EXAM: LUMBAR SPINE - COMPLETE 4+ VIEW COMPARISON:  CT 06/19/2016 06/05/2016 radiograph FINDINGS: Severe compression deformity of T11 with mild 4 mm retropulsion but no change compared to thoracic radiographs from May of 2018. Minimal retrolisthesis of L1 on L2. Remaining vertebral body heights are normal. Moderate degenerative changes at T12-L1. Mild scoliosis. Hernia repair in the right lower quadrant. IMPRESSION: 1. No definite acute osseous abnormality. 2. Severe compression deformity at T11, stable since May 2018. Electronically Signed   By: Jasmine Pang M.D.   On: 11/17/2016 17:46   Ct Head Wo Contrast  Result Date: 11/17/2016 CLINICAL DATA:  71 year old female fell over step door hitting lower part of face. Denies loss of consciousness. Initial encounter. EXAM: CT HEAD WITHOUT CONTRAST CT MAXILLOFACIAL WITHOUT CONTRAST CT CERVICAL SPINE WITHOUT CONTRAST TECHNIQUE: Multidetector CT imaging of the head, cervical spine, and maxillofacial structures were performed using the standard protocol without intravenous contrast. Multiplanar CT image reconstructions of the cervical spine and maxillofacial structures were also generated. COMPARISON:  05/18/2016 and 01/17/2016. FINDINGS: CT HEAD  FINDINGS Brain: No intracranial hemorrhage or CT evidence of large acute infarct. No intracranial mass lesion noted on this unenhanced exam. Vascular: Vascular calcifications Skull: No skull fracture Other: Small hematoma right preseptal/supraorbital region. CT MAXILLOFACIAL FINDINGS Osseous: No fracture. Bilateral temporal mandibular joint degenerative change greater on left. Orbits: Globes appear to be grossly intact. Symmetric extra-ocular muscles. Sinuses: Mild polypoid opacification inferior right maxillary sinus. Minimal mucosal thickening left maxillary sinus. Soft tissues: Soft tissue swelling right preseptal/supraorbital region consistent with hematoma. CT CERVICAL SPINE FINDINGS Alignment: Stable alignment. Skull base and vertebrae: No cervical spine fracture. Soft tissues and spinal canal: No abnormal prevertebral soft tissue swelling Disc levels: Multilevel cervical spondylotic changes with various degrees spinal stenosis and foraminal narrowing similar to prior exam. Upper chest: No worrisome abnormality Other: Asymmetry pharynx with mild compression right posterior aspect may be related to osteophyte and ectatic vessel unchanged from prior exams. Carotid bifurcation calcifications. IMPRESSION: Soft tissue swelling consistent with hematoma right preseptal/supraorbital region without underlying fracture or intracranial hemorrhage. Globes appear to be grossly intact. No acute cervical spine abnormality. Chronic changes as detailed above. Electronically Signed   By: Lacy Duverney M.D.   On: 11/17/2016 18:20   Ct Cervical Spine Wo Contrast  Result Date: 11/17/2016 CLINICAL DATA:  71 year old female fell over step door hitting lower part of face. Denies loss of consciousness. Initial encounter. EXAM: CT HEAD WITHOUT CONTRAST CT MAXILLOFACIAL WITHOUT CONTRAST CT CERVICAL SPINE WITHOUT CONTRAST TECHNIQUE: Multidetector CT imaging of the head, cervical spine, and maxillofacial structures were performed  using the standard protocol without intravenous contrast. Multiplanar CT image reconstructions of the cervical spine and maxillofacial structures were also generated. COMPARISON:  05/18/2016 and 01/17/2016. FINDINGS: CT HEAD FINDINGS Brain: No intracranial hemorrhage or CT evidence of large acute infarct. No intracranial mass lesion noted on this unenhanced exam. Vascular: Vascular calcifications Skull: No skull fracture Other: Small hematoma right preseptal/supraorbital region. CT MAXILLOFACIAL FINDINGS Osseous: No fracture. Bilateral temporal mandibular joint degenerative change greater on  left. Orbits: Globes appear to be grossly intact. Symmetric extra-ocular muscles. Sinuses: Mild polypoid opacification inferior right maxillary sinus. Minimal mucosal thickening left maxillary sinus. Soft tissues: Soft tissue swelling right preseptal/supraorbital region consistent with hematoma. CT CERVICAL SPINE FINDINGS Alignment: Stable alignment. Skull base and vertebrae: No cervical spine fracture. Soft tissues and spinal canal: No abnormal prevertebral soft tissue swelling Disc levels: Multilevel cervical spondylotic changes with various degrees spinal stenosis and foraminal narrowing similar to prior exam. Upper chest: No worrisome abnormality Other: Asymmetry pharynx with mild compression right posterior aspect may be related to osteophyte and ectatic vessel unchanged from prior exams. Carotid bifurcation calcifications. IMPRESSION: Soft tissue swelling consistent with hematoma right preseptal/supraorbital region without underlying fracture or intracranial hemorrhage. Globes appear to be grossly intact. No acute cervical spine abnormality. Chronic changes as detailed above. Electronically Signed   By: Lacy Duverney M.D.   On: 11/17/2016 18:20   Dg Knee Complete 4 Views Left  Result Date: 11/17/2016 CLINICAL DATA:  Initial encounter for trauma and pain. EXAM: LEFT KNEE - COMPLETE 4+ VIEW COMPARISON:  12/20/2012  FINDINGS: Left knee arthroplasty. Lucency on the third image about the medial tibia is favored to be artifactual; not confirmed on other views. Otherwise, no acute fracture dislocation. No definite joint effusion. Vascular calcifications. IMPRESSION: Left knee arthroplasty, without acute osseous abnormality. Favor artifactual lucency within the medial proximal tibia on one view. If symptoms persist or warrant, CT could be performed to exclude unlikely nondisplaced fracture. Vascular calcifications. Electronically Signed   By: Jeronimo Greaves M.D.   On: 11/17/2016 17:43   Dg Knee Complete 4 Views Right  Result Date: 11/17/2016 CLINICAL DATA:  Fall bilateral knee pain EXAM: RIGHT KNEE - COMPLETE 4+ VIEW COMPARISON:  None. FINDINGS: No fracture or malalignment. No large effusion. Moderate medial and patellofemoral degenerative changes. Vascular calcifications. IMPRESSION: Mild to moderate degenerative changes. No acute osseous abnormality. Electronically Signed   By: Jasmine Pang M.D.   On: 11/17/2016 17:43   Ct Maxillofacial Wo Contrast  Result Date: 11/17/2016 CLINICAL DATA:  71 year old female fell over step door hitting lower part of face. Denies loss of consciousness. Initial encounter. EXAM: CT HEAD WITHOUT CONTRAST CT MAXILLOFACIAL WITHOUT CONTRAST CT CERVICAL SPINE WITHOUT CONTRAST TECHNIQUE: Multidetector CT imaging of the head, cervical spine, and maxillofacial structures were performed using the standard protocol without intravenous contrast. Multiplanar CT image reconstructions of the cervical spine and maxillofacial structures were also generated. COMPARISON:  05/18/2016 and 01/17/2016. FINDINGS: CT HEAD FINDINGS Brain: No intracranial hemorrhage or CT evidence of large acute infarct. No intracranial mass lesion noted on this unenhanced exam. Vascular: Vascular calcifications Skull: No skull fracture Other: Small hematoma right preseptal/supraorbital region. CT MAXILLOFACIAL FINDINGS Osseous: No  fracture. Bilateral temporal mandibular joint degenerative change greater on left. Orbits: Globes appear to be grossly intact. Symmetric extra-ocular muscles. Sinuses: Mild polypoid opacification inferior right maxillary sinus. Minimal mucosal thickening left maxillary sinus. Soft tissues: Soft tissue swelling right preseptal/supraorbital region consistent with hematoma. CT CERVICAL SPINE FINDINGS Alignment: Stable alignment. Skull base and vertebrae: No cervical spine fracture. Soft tissues and spinal canal: No abnormal prevertebral soft tissue swelling Disc levels: Multilevel cervical spondylotic changes with various degrees spinal stenosis and foraminal narrowing similar to prior exam. Upper chest: No worrisome abnormality Other: Asymmetry pharynx with mild compression right posterior aspect may be related to osteophyte and ectatic vessel unchanged from prior exams. Carotid bifurcation calcifications. IMPRESSION: Soft tissue swelling consistent with hematoma right preseptal/supraorbital region without underlying fracture or intracranial hemorrhage.  Globes appear to be grossly intact. No acute cervical spine abnormality. Chronic changes as detailed above. Electronically Signed   By: Lacy Duverney M.D.   On: 11/17/2016 18:20    Procedures Procedures (including critical care time)  Medications Ordered in ED Medications  acetaminophen (TYLENOL) tablet 650 mg (650 mg Oral Given 11/17/16 1754)     Initial Impression / Assessment and Plan / ED Course  I have reviewed the triage vital signs and the nursing notes.  Pertinent labs & imaging results that were available during my care of the patient were reviewed by me and considered in my medical decision making (see chart for details).     Patient presenting with bilateral knee pain, low back pain, and facial pain after mechanical fall.  Physical exam reassuring, as patient without obvious neurologic deficits, and no pain with movement of the eyes..   Will obtain CT head neck and maxillofacial, x-ray of lumbar spine, and x-ray of bilateral knees for further evaluation.  Tylenol given for pain control All imaging negative for acute findings.  Case discussed with attending, Dr. Rush Landmark evaluated the patient.  Findings discussed with patient.  At this time, patient appears safe for discharge.  Return precautions given.  Patient states she understands and agrees to plan.  Final Clinical Impressions(s) / ED Diagnoses   Final diagnoses:  Fall, initial encounter    New Prescriptions New Prescriptions   No medications on file     Alveria Apley, PA-C 11/17/16 1935    Tegeler, Canary Brim, MD 11/17/16 [phone]

## 2016-12-05 ENCOUNTER — Ambulatory Visit
Admission: RE | Admit: 2016-12-05 | Discharge: 2016-12-05 | Disposition: A | Discharge status: Home / Self Care | Payer: Medicare Other | Source: Ambulatory Visit | Attending: Cardiology | Admitting: Cardiology

## 2016-12-05 DIAGNOSIS — Z1231 Encounter for screening mammogram for malignant neoplasm of breast: Secondary | ICD-10-CM

## 2017-08-04 ENCOUNTER — Encounter (HOSPITAL_COMMUNITY): Payer: Self-pay | Admitting: Emergency Medicine

## 2017-08-04 ENCOUNTER — Emergency Department (HOSPITAL_COMMUNITY): Payer: Medicare Other

## 2017-08-04 ENCOUNTER — Other Ambulatory Visit: Payer: Self-pay

## 2017-08-04 ENCOUNTER — Emergency Department (HOSPITAL_COMMUNITY)
Admission: EM | Admit: 2017-08-04 | Discharge: 2017-08-05 | Disposition: A | Discharge status: Home / Self Care | Payer: Medicare Other | Attending: Emergency Medicine | Admitting: Emergency Medicine

## 2017-08-04 DIAGNOSIS — Z8679 Personal history of other diseases of the circulatory system: Secondary | ICD-10-CM | POA: Diagnosis not present

## 2017-08-04 DIAGNOSIS — R0789 Other chest pain: Secondary | ICD-10-CM | POA: Diagnosis not present

## 2017-08-04 DIAGNOSIS — I2510 Atherosclerotic heart disease of native coronary artery without angina pectoris: Secondary | ICD-10-CM | POA: Diagnosis not present

## 2017-08-04 DIAGNOSIS — Z7984 Long term (current) use of oral hypoglycemic drugs: Secondary | ICD-10-CM | POA: Diagnosis not present

## 2017-08-04 DIAGNOSIS — E119 Type 2 diabetes mellitus without complications: Secondary | ICD-10-CM | POA: Insufficient documentation

## 2017-08-04 DIAGNOSIS — I10 Essential (primary) hypertension: Secondary | ICD-10-CM | POA: Insufficient documentation

## 2017-08-04 DIAGNOSIS — Z7982 Long term (current) use of aspirin: Secondary | ICD-10-CM | POA: Insufficient documentation

## 2017-08-04 DIAGNOSIS — Z79899 Other long term (current) drug therapy: Secondary | ICD-10-CM | POA: Diagnosis not present

## 2017-08-04 LAB — BASIC METABOLIC PANEL
Anion gap: 12 (ref 5–15)
BUN: 15 mg/dL (ref 8–23)
CALCIUM: 9 mg/dL (ref 8.9–10.3)
CO2: 20 mmol/L — ABNORMAL LOW (ref 22–32)
CREATININE: 0.57 mg/dL (ref 0.44–1.00)
Chloride: 102 mmol/L (ref 98–111)
GFR calc Af Amer: >60 mL/min (ref >60)
GLUCOSE: 118 mg/dL — AB (ref 70–99)
Potassium: 4.2 mmol/L (ref 3.5–5.1)
SODIUM: 134 mmol/L — AB (ref 135–145)

## 2017-08-04 LAB — CBC
HEMATOCRIT: 35.5 % — AB (ref 36.0–46.0)
Hemoglobin: 11.3 g/dL — ABNORMAL LOW (ref 12.0–15.0)
MCH: 30.9 pg (ref 26.0–34.0)
MCHC: 31.8 g/dL (ref 30.0–36.0)
MCV: 97 fL (ref 78.0–100.0)
PLATELETS: 391 10*3/uL (ref 150–400)
RBC: 3.66 MIL/uL — ABNORMAL LOW (ref 3.87–5.11)
RDW: 13.2 % (ref 11.5–15.5)
WBC: 10.7 10*3/uL — AB (ref 4.0–10.5)

## 2017-08-04 LAB — I-STAT TROPONIN, ED: Troponin i, poc: 0 ng/mL (ref 0.00–0.08)

## 2017-08-04 NOTE — ED Triage Notes (Addendum)
Per EMS pt lives alone in an apartment. Off and on chest pain with "fluttering" all week. Seems to coincide with getting hot as her apartment air conditioning has not been working well.  Pt had 7/10 CP on EMS arrival completely resolved with 324 ASA and 1 nitro.  BP 120/74, HR 89, 94% on RA, CBG 108. Pt has some confusion on assessment but unsure if this is baseline.

## 2017-08-04 NOTE — ED Provider Notes (Signed)
[facility] EMERGENCY DEPARTMENT Provider Note   CSN: [id number] Arrival date & time: 08/04/17  2030     History   Chief Complaint Chief Complaint  Patient presents with  . Chest Pain    HPI Katherine [name] is a 72 y.o. female.  Patient with a history of DM, CAD, fibromyalgia, HTN, HLD, presents to the ED by EMS called out for "heart fluttering". She feels her chest is tight but not painful. No breathing difficulty, cough. No recent illness or fever. She denies nausea or vomiting. She reports the air conditioning in her apartment does not work and when she gets overheated her heart flutters. Per EMS she complained of chest pain that was relieved with aspirin and one NTG.   The history is provided by the patient and the EMS personnel. No language interpreter was used.  Chest Pain   Pertinent negatives include no abdominal pain, no fever, no nausea and no shortness of breath.    Past Medical History:  Diagnosis Date  . ABDOMINAL PAIN, LOWER 02/04/2010  . ABNORMAL THYROID FUNCTION TESTS 08/04/2009  . ALLERGIC RHINITIS 09/05/2006  . ANXIETY 07/31/2007  . Aortic stenosis    followed at [facility]; 06/14/16 EF 55-60%, mod-severe AS, peak/mean grad 26.46 mmHg/ 16.31 mmHg, AVA by continuity eq 0.90 cm2  . Blood transfusion   . Cataract    resolved -surgery done  . DEPRESSION 09/05/2006  . DIABETES MELLITUS, TYPE II 07/31/2007   no meds  . Diabetic foot ulcer (HCC) 10/26/2014  . DIVERTICULOSIS, COLON 09/05/2006  . Dyspnea   . Dysrhythmia    patient had a "irregular beat that is being monitored by pcp"  . Dysuria 04/15/2010  . Failure to thrive in childhood 04/06/2010  . FATIGUE 04/15/2010  . FIBROMYALGIA 05/10/2009  . FRACTURE, PELVIS, RIGHT 04/15/2010  . GASTROENTERITIS, ACUTE 12/25/2008  . GERD 09/05/2006  . Heart murmur   . HYPERLIPIDEMIA 09/05/2006  . HYPERTENSION 09/05/2006  . KNEE PAIN, LEFT 02/04/2010  . MRSA infection 10/26/2014  . OSTEOARTHRITIS 09/05/2006  .  OSTEOPOROSIS 07/31/2007  . Other specified forms of hearing loss 11/01/2009  . SINUSITIS- ACUTE-NOS 10/09/2007  . SKIN LESION 05/10/2009  . SPRAIN&STRAIN OTH SPEC SITES SHOULDER&UPPER ARM 11/01/2009  . Unspecified hearing loss 10/09/2007  . URI 02/20/2007  . UTI 02/20/2007    Patient Active Problem List   Diagnosis Date Noted  . 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 CAP (community acquired pneumonia) 09/23/2015  . Sepsis (HCC) 09/23/2015  . Hypernatremia 09/23/2015  . Dehydration 09/23/2015  . MRSA infection 10/26/2014  . Diabetic foot ulcer (HCC) 10/26/2014  . Pre-ulcerative corn or callous 10/09/2013  . UTI (urinary tract infection) 05/09/2013  . Hypokalemia 05/09/2013  . Altered mental status 04/29/2013  . Normocytic anemia 01/06/2013  . 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 Thyroid nodule 11/07/2012  . Aortic stenosis, mild 05/27/2012  . Heart murmur, systolic 05/09/2012  . Encounter for preventative adult health care exam with abnormal findings 10/07/2010  . FATIGUE 04/15/2010  . Dysuria 04/15/2010  . FRACTURE, PELVIS, RIGHT 04/15/2010  . Failure to thrive in childhood 04/06/2010  . SPRAIN&STRAIN OTH SPEC SITES SHOULDER&UPPER ARM 11/01/2009  . ABNORMAL THYROID FUNCTION TESTS 08/04/2009  . SKIN LESION 05/10/2009  . Fibromyalgia 05/10/2009  . Anxiety state 07/31/2007  . Osteoporosis 07/31/2007  . Hyperlipidemia 09/05/2006  . DEPRESSION 09/05/2006  . Essential hypertension 09/05/2006  . Allergic rhinitis 09/05/2006  . GERD 09/05/2006  .  DIVERTICULOSIS, COLON 09/05/2006  . OSTEOARTHRITIS 09/05/2006    Past Surgical History:  Procedure Laterality Date  . ABDOMINAL HYSTERECTOMY  12/08   Bladder tac, partial hysterectomy  . BREAST BIOPSY    . catarct removal     both eyes  . COLONOSCOPY    . I&D KNEE WITH POLY EXCHANGE Left 01/03/2013   Procedure: IRRIGATION AND DEBRIDEMENT LEFT KNEE WITH POLY EXCHANGE;  Surgeon: Kathryne Hitch, MD;  Location: [facility] ORS;  Service: Orthopedics;  Laterality: Left;  . inguinal herniorrhapy     right Dr. Freida Busman 06/2009  . REVERSE SHOULDER ARTHROPLASTY Right 09/07/2016  . REVERSE SHOULDER ARTHROPLASTY Right 09/07/2016   Procedure: REVERSE SHOULDER ARTHROPLASTY;  Surgeon: Jones Broom, MD;  Location: [facility] OR;  Service: Orthopedics;  Laterality: Right;  Right reverse shoulder arthroplasty  . TONSILLECTOMY    . TOTAL KNEE ARTHROPLASTY Left 12/20/2012   Procedure: LEFT TOTAL KNEE ARTHROPLASTY;  Surgeon: Kathryne Hitch, MD;  Location: [facility] OR[facility];  Service: Orthopedics;  Laterality: Left;     OB History   None      Home Medications    Prior to Admission medications   Medication Sig Start Date End Date Taking? Authorizing Provider  aspirin EC 81 MG tablet Take 81 mg by mouth 2 (two) times daily.    [provider]  benztropine (COGENTIN) 0.5 MG tablet Take 0.5 mg by mouth 2 (two) times daily.      [provider]  calcium-vitamin D (OSCAL WITH D) 500-200 MG-UNIT tablet Take 1 tablet by mouth 3 (three) times daily. Patient taking differently: Take 1 tablet by mouth every other day.  02/25/15   Corwin Levins, MD  cetirizine (ZYRTEC) 10 MG tablet TAKE 1 TABLET BY MOUTH DAILY 11/02/15   Corwin Levins, MD  gabapentin (NEURONTIN) 100 MG capsule Take 100 mg by mouth 3 (three) times daily.  04/20/16   [provider]  glucose blood (ONETOUCH VERIO) test strip 1 each by Other route 2 (two) times daily. Use to check blood sugars twice a day Dx E11.9    [provider]  HYDROcodone-acetaminophen (NORCO) 5-325 MG tablet Take 1-2 tablets by mouth every 4 (four) hours as needed for moderate pain. 09/08/16   Jiles Harold, PA-C  hydrOXYzine (ATARAX/VISTARIL) 10 MG tablet Take 1 tablet (10 mg total) by mouth 2 (two) times daily as needed. Patient taking differently: Take 10 mg by mouth 2 (two) times daily.  12/11/13   Corwin Levins, MD  ipratropium  (ATROVENT) 0.06 % nasal spray USE 1 SPRAY IN EACH NOSTRIL THREE TIMES DAILY Patient taking differently: USE 1 SPRAY IN EACH NOSTRIL THREE TIMES DAILY AS NEEDED FOR ALLERGIES/SINUS 07/26/15   Corwin Levins, MD  metFORMIN (GLUCOPHAGE) 500 MG tablet Take 500 mg by mouth 2 (two) times daily.  05/18/16   [provider]  Multiple Vitamin (MULTIVITAMIN WITH MINERALS) TABS tablet Take 1 tablet by mouth daily.    [provider]  omega-3 acid ethyl esters (LOVAZA) 1 g capsule Take 1 g by mouth daily.     [provider]  [facility] DELICA LANCETS 33G MISC CHECK BLOOD SUGAR ONCE DAILY AS DIRECTED 06/23/15   Corwin Levins, MD  pantoprazole (PROTONIX) 40 MG tablet TAKE 1 TABLET BY MOUTH DAILY 11/16/15   Corwin Levins, MD  Potassium Chloride ER 20 MEQ TBCR Take 20 mEq by mouth 2 (two) times daily. 07/04/16   [provider]  pravastatin (PRAVACHOL) 20 MG tablet Take  20 mg by mouth daily. 08/03/16   [provider]  risperiDONE (RISPERDAL) 3 MG tablet Take 3 mg by mouth at bedtime.     [provider]  traMADol (ULTRAM) 50 MG tablet Take 50 mg by mouth every 6 (six) hours as needed (for pain.).  06/02/16   [provider]  UREA 20 INTENSIVE HYDRATING 20 % cream Apply 1 application topically daily as needed. For dry skin 08/07/16   [provider]    Family History Family History  Problem Relation Age of Onset  . Coronary artery disease Other   . Diabetes Other   . Colon cancer Neg Hx   . Esophageal cancer Neg Hx   . Stomach cancer Neg Hx   . Rectal cancer Neg Hx     Social History Social History   Tobacco Use  . Smoking status: Never Smoker  . Smokeless tobacco: Never Used  Substance Use Topics  . Alcohol use: No    Alcohol/week: 0.0 oz  . Drug use: No     Allergies   Penicillins; Coconut oil; Pollen extract; Mellaril [thioridazine]; and Thioridazine hcl   Review of Systems Review of Systems  Constitutional: Negative for chills  and fever.  HENT: Negative.   Respiratory: Negative.  Negative for shortness of breath.   Cardiovascular: Positive for chest pain.  Gastrointestinal: Negative.  Negative for abdominal pain and nausea.  Musculoskeletal: Negative.   Skin: Negative.   Neurological: Negative.      Physical Exam Updated Vital Signs BP 121/77 (BP Location: Right Arm)   Pulse 87   Temp 98.2 F (36.8 C) (Oral)   Resp 20   SpO2 95%   Physical Exam  Constitutional: She appears well-developed and well-nourished.  HENT:  Head: Normocephalic.  Neck: Normal range of motion. Neck supple.  Cardiovascular: Normal rate and regular rhythm.  Pulmonary/Chest: Effort normal and breath sounds normal. She has no wheezes. She has no rhonchi. She has no rales.  Abdominal: Soft. Bowel sounds are normal. There is no tenderness. There is no rebound and no guarding.  Musculoskeletal: Normal range of motion.       Right lower leg: She exhibits no edema.       Left lower leg: She exhibits no edema.  Neurological: She is alert.  Skin: Skin is warm and dry. No rash noted.  Psychiatric: She has a normal mood and affect.     ED Treatments / Results  Labs (all labs ordered are listed, but only abnormal results are displayed) Labs Reviewed  BASIC METABOLIC PANEL - Abnormal; Notable for the following components:      Result Value   Sodium 134 (*)    CO2 20 (*)    Glucose, Bld 118 (*)    All other components within normal limits  CBC - Abnormal; Notable for the following components:   WBC 10.7 (*)    RBC 3.66 (*)    Hemoglobin 11.3 (*)    HCT 35.5 (*)    All other components within normal limits  I-STAT TROPONIN, ED    EKG None  Radiology Dg Chest 2 View  Result Date: 08/04/2017 CLINICAL DATA:  Chest pain EXAM: CHEST - 2 VIEW COMPARISON:  09/23/2015 FINDINGS: Two views study shows low lung volumes. Streaky opacity at the bases is probably atelectasis although pneumonia cannot be completely excluded.  Cardiopericardial silhouette is at upper limits of normal for size. Bones are diffusely demineralized. Status post right shoulder replacement. Marked compression deformity of T11  vertebral body is stable. IMPRESSION: Low lung volumes with streaky bibasilar opacity most likely atelectasis although component of underlying pneumonia cannot be excluded. Electronically Signed   By: Kennith Center M.D.   On: 08/04/2017 21:16    Procedures Procedures (including critical care time)  Medications Ordered in ED Medications - No data to display   Initial Impression / Assessment and Plan / ED Course  I have reviewed the triage vital signs and the nursing notes.  Pertinent labs & imaging results that were available during my care of the patient were reviewed by me and considered in my medical decision making (see chart for details).     The patient arrives EMS called out for heart fluttering and, per EMS, chest pain relieved by NTG and ASA.   The patient is confused. She reverts back to her childhood giving a story of how her mother gave her up "she didn't want me", to her dad killed in a car wreck, and stating she was there when [place] Rudd was [place] (in [place]). Unknown baseline mental status. She is able to give similar history to EMS - [facility] being out, calling to have it fixed, heart fluttering when she gets overheated. She is oriented, alert, appropriate. Likely patient is at her baseline.  Concern for elderly woman living in an apartment without AC in current temperatures and humidity. Have asked for a social work consultation in the am to determine appropriateness of living situation.   Patient care signed out to Terance Hart, PA-C, pending social work report. Can discharge home when it is felt appropriate.   Final Clinical Impressions(s) / ED Diagnoses   Final diagnoses:  None   1. Palpitations 2. Unsafe living condition? - pending social work consult  ED Discharge Orders    None         Elpidio Anis, PA-C 08/05/17 1610    Blane Ohara, MD 08/05/17 [phone]

## 2017-08-05 ENCOUNTER — Other Ambulatory Visit: Payer: Self-pay

## 2017-08-05 DIAGNOSIS — R0789 Other chest pain: Secondary | ICD-10-CM | POA: Diagnosis not present

## 2017-08-05 LAB — I-STAT TROPONIN, ED: Troponin i, poc: 0 ng/mL (ref 0.00–0.08)

## 2017-08-05 MED ORDER — ACETAMINOPHEN 325 MG PO TABS
650.0000 mg | ORAL_TABLET | Freq: Once | ORAL | Status: AC
  Administered 2017-08-05: 650 mg via ORAL
  Filled 2017-08-05: qty 2

## 2017-08-05 NOTE — ED Notes (Signed)
Sharion SettlerFriend - Faye -- will take pt home to her apartment- discharge instructions given

## 2017-08-05 NOTE — ED Notes (Signed)
Katherine [name]  [phone]

## 2017-08-05 NOTE — ED Provider Notes (Signed)
72 year old female with atypical chest pain and feeling like her heart is fluttering. Cardiac work up in the ED has been reassuring. Plan is to consult SW to see if we can help.  Nursing has not been able to get in touch with SW. A friend has arrived at the ED, Lucendia HerrlichFaye, who would like to take the patient back to her home. She states she will call the patient's apartment complex and figure out how to get her air working again. I think this is most appropriate since this is a social issue. Pt was discharged and was given return precautions.   Bethel BornGekas, Kelly Marie, PA-C 08/05/17 1052    Wynetta FinesMessick, Peter C, MD 08/05/17 [phone]

## 2017-08-05 NOTE — Discharge Instructions (Signed)
Please return if worsening.

## 2017-11-07 ENCOUNTER — Other Ambulatory Visit: Payer: Self-pay | Admitting: Cardiology

## 2017-11-07 DIAGNOSIS — F0390 Unspecified dementia without behavioral disturbance: Secondary | ICD-10-CM

## 2017-11-21 ENCOUNTER — Ambulatory Visit
Admission: RE | Admit: 2017-11-21 | Discharge: 2017-11-21 | Disposition: A | Discharge status: Home / Self Care | Payer: Medicare Other | Source: Ambulatory Visit | Attending: Cardiology | Admitting: Cardiology

## 2017-11-21 DIAGNOSIS — F0390 Unspecified dementia without behavioral disturbance: Secondary | ICD-10-CM

## 2018-03-04 ENCOUNTER — Other Ambulatory Visit: Payer: Self-pay | Admitting: Cardiology

## 2018-03-04 DIAGNOSIS — Z1231 Encounter for screening mammogram for malignant neoplasm of breast: Secondary | ICD-10-CM

## 2018-04-09 ENCOUNTER — Ambulatory Visit
Admission: RE | Admit: 2018-04-09 | Discharge: 2018-04-09 | Disposition: A | Discharge status: Home / Self Care | Payer: Medicare Other | Source: Ambulatory Visit | Attending: Cardiology | Admitting: Cardiology

## 2018-04-09 DIAGNOSIS — Z1231 Encounter for screening mammogram for malignant neoplasm of breast: Secondary | ICD-10-CM

## 2018-07-10 ENCOUNTER — Encounter: Payer: Self-pay | Admitting: *Deleted

## 2018-07-16 ENCOUNTER — Encounter: Payer: Self-pay | Admitting: Cardiology

## 2018-07-25 ENCOUNTER — Institutional Professional Consult (permissible substitution): Payer: Medicare Other | Admitting: Cardiovascular Disease

## 2018-08-19 ENCOUNTER — Institutional Professional Consult (permissible substitution): Payer: Medicare Other | Admitting: Cardiovascular Disease

## 2018-08-19 ENCOUNTER — Other Ambulatory Visit: Payer: Self-pay

## 2018-08-19 ENCOUNTER — Ambulatory Visit (INDEPENDENT_AMBULATORY_CARE_PROVIDER_SITE_OTHER): Payer: Medicare Other | Admitting: Cardiovascular Disease

## 2018-08-19 ENCOUNTER — Encounter: Payer: Self-pay | Admitting: Cardiovascular Disease

## 2018-08-19 VITALS — BP 126/78 | HR 68 | Ht 58.5 in | Wt 149.1 lb

## 2018-08-19 DIAGNOSIS — I350 Nonrheumatic aortic (valve) stenosis: Secondary | ICD-10-CM

## 2018-08-19 LAB — CBC WITH DIFFERENTIAL/PLATELET
Basophils Absolute: 0.1 10*3/uL (ref 0.0–0.2)
Basos: 1 %
EOS (ABSOLUTE): 0.3 10*3/uL (ref 0.0–0.4)
Eos: 4 %
Hematocrit: 37.4 % (ref 34.0–46.6)
Hemoglobin: 11.9 g/dL (ref 11.1–15.9)
Immature Grans (Abs): 0 10*3/uL (ref 0.0–0.1)
Immature Granulocytes: 0 %
Lymphocytes Absolute: 1.8 10*3/uL (ref 0.7–3.1)
Lymphs: 21 %
MCH: 30.4 pg (ref 26.6–33.0)
MCHC: 31.8 g/dL (ref 31.5–35.7)
MCV: 96 fL (ref 79–97)
Monocytes Absolute: 0.7 10*3/uL (ref 0.1–0.9)
Monocytes: 9 %
Neutrophils Absolute: 5.4 10*3/uL (ref 1.4–7.0)
Neutrophils: 65 %
Platelets: 293 10*3/uL (ref 150–450)
RBC: 3.91 x10E6/uL (ref 3.77–5.28)
RDW: 12.6 % (ref 11.7–15.4)
WBC: 8.2 10*3/uL (ref 3.4–10.8)

## 2018-08-19 LAB — BASIC METABOLIC PANEL
BUN/Creatinine Ratio: 26 (ref 12–28)
BUN: 14 mg/dL (ref 8–27)
CO2: 22 mmol/L (ref 20–29)
Calcium: 9.2 mg/dL (ref 8.7–10.3)
Chloride: 98 mmol/L (ref 96–106)
Creatinine, Ser: 0.54 mg/dL — ABNORMAL LOW (ref 0.57–1.00)
GFR calc Af Amer: 109 mL/min/{1.73_m2} (ref >59)
GFR calc non Af Amer: 95 mL/min/{1.73_m2} (ref >59)
Glucose: 81 mg/dL (ref 65–99)
Potassium: 5 mmol/L (ref 3.5–5.2)
Sodium: 137 mmol/L (ref 134–144)

## 2018-08-19 NOTE — Progress Notes (Addendum)
Patient ID: Katherine [name] MRN: [id number] DOB/AGE: [birth year] 73 y.o.  Primary Care Physician:Duran, Otelia Limes, PA-C Primary Cardiologist: Isaias Cowman, PA-C and Lawson Radar, MD  HPI:  The patient is here with her close friend today who takes her to all of her medical appointments. She lives alone and has not been married. The patient has never had her driver's license. She worked at [employer], now retired for many years. Her friend also checks in on her regularly and reports that they talk on the phone 2-3 times every day. The patient has an aide who is there Mon-Saturday and helps with meal preparation and bathing.   She has had a heart murmur for many years. She doesn't recall a clinical history of rheumatic fever or endocarditis. The patient reports shortness of breath with low level activity. She also reports chest pain when she gets emotionally upset. She sleeps well and denies orthopnea or PND. She denies leg swelling. She complains of 'tremors' and 'shakiness.' The patient carries a diagnosis of paroxysmal atrial fibrillation, but she has not been on anticoagulant drugs. This may have been remote as the patient does not recall having any issues with atrial fibrillation over the past few years. She denies any history of syncope. The patient has advanced arthritis and has had both knee and shoulder replacement. She ambulates with a cane and has a history of falls, but none recently.   The patient has had regular dental care, but reports problems with her teeth and states that she needs 'some work done.'   Past Medical History:  Diagnosis Date  . ABDOMINAL PAIN, LOWER 02/04/2010  . ABNORMAL THYROID FUNCTION TESTS 08/04/2009  . ALLERGIC RHINITIS 09/05/2006  . ANXIETY 07/31/2007  . Aortic stenosis    followed at [facility]; 06/14/16 EF 55-60%, mod-severe AS, peak/mean grad 26.46 mmHg/ 16.31 mmHg, AVA by continuity eq 0.90 cm2  . Blood transfusion   .  Cataract    resolved -surgery done  . DEPRESSION 09/05/2006  . DIABETES MELLITUS, TYPE II 07/31/2007   no meds  . Diabetic foot ulcer ([facility]) 10/26/2014  . DIVERTICULOSIS, COLON 09/05/2006  . Dyspnea   . Dysrhythmia    patient had a "irregular beat that is being monitored by pcp"  . Dysuria 04/15/2010  . Failure to thrive in childhood 04/06/2010  . FATIGUE 04/15/2010  . FIBROMYALGIA 05/10/2009  . FRACTURE, PELVIS, RIGHT 04/15/2010  . GASTROENTERITIS, ACUTE 12/25/2008  . GERD 09/05/2006  . Heart murmur   . HYPERLIPIDEMIA 09/05/2006  . HYPERTENSION 09/05/2006  . KNEE PAIN, LEFT 02/04/2010  . MRSA infection 10/26/2014  . OSTEOARTHRITIS 09/05/2006  . OSTEOPOROSIS 07/31/2007  . Other specified forms of hearing loss 11/01/2009  . SINUSITIS- ACUTE-NOS 10/09/2007  . SKIN LESION 05/10/2009  . SPRAIN&STRAIN OTH SPEC SITES SHOULDER&UPPER ARM 11/01/2009  . Unspecified hearing loss 10/09/2007  . URI 02/20/2007  . UTI 02/20/2007    Past Surgical History:  Procedure Laterality Date  . ABDOMINAL HYSTERECTOMY  12/08   Bladder tac, partial hysterectomy  . BREAST BIOPSY    . catarct removal     both eyes  . COLONOSCOPY    . I&D KNEE WITH POLY EXCHANGE Left 01/03/2013   Procedure: IRRIGATION AND DEBRIDEMENT LEFT KNEE WITH POLY EXCHANGE;  Surgeon: Mcarthur Rossetti, MD;  Location: [facility];  Service: Orthopedics;  Laterality: Left;  . inguinal herniorrhapy     right Dr. [name] 06/2009  . REVERSE SHOULDER ARTHROPLASTY Right 09/07/2016  . REVERSE  SHOULDER ARTHROPLASTY Right 09/07/2016   Procedure: REVERSE SHOULDER ARTHROPLASTY;  Surgeon: Jones Broomhandler, Justin, MD;  [facility];  Service: Orthopedics;  Laterality: Right;  Right reverse shoulder arthroplasty  . TONSILLECTOMY    . TOTAL KNEE ARTHROPLASTY Left 12/20/2012   Procedure: LEFT TOTAL KNEE ARTHROPLASTY;  Surgeon: Kathryne Hitchhristopher Y Blackman, MD;  Location: [facility];  Service: Orthopedics;  Laterality: Left;    Family History  Problem Relation Age of Onset  . Coronary artery  disease Other   . Diabetes Other   . Colon cancer Neg Hx   . Esophageal cancer Neg Hx   . Stomach cancer Neg Hx   . Rectal cancer Neg Hx     Social History   Socioeconomic History  . Marital status: Single    Spouse name: Not on file  . Number of children: Not on file  . Years of education: Not on file  . Highest education level: Not on file  Occupational History  . Occupation: retired [employer]    Employer: DISABLED  Social Needs  . Financial resource strain: Not on file  . Food insecurity    Worry: Not on file    Inability: Not on file  . Transportation needs    Medical: Not on file    Non-medical: Not on file  Tobacco Use  . Smoking status: Never Smoker  . Smokeless tobacco: Never Used  Substance and Sexual Activity  . Alcohol use: No    Alcohol/week: 0.0 standard drinks  . Drug use: No  . Sexual activity: Not on file  Lifestyle  . Physical activity    Days per week: Not on file    Minutes per session: Not on file  . Stress: Not on file  Relationships  . Social Musicianconnections    Talks on phone: Not on file    Gets together: Not on file    Attends religious service: Not on file    Active member of club or organization: Not on file    Attends meetings of clubs or organizations: Not on file    Relationship status: Not on file  . Intimate partner violence    Fear of current or ex partner: Not on file    Emotionally abused: Not on file    Physically abused: Not on file    Forced sexual activity: Not on file  Other Topics Concern  . Not on file  Social History Narrative   LIves alone with home health services.     Prior to Admission medications   Medication Sig Start Date End Date Taking? Authorizing Provider  aspirin EC 81 MG tablet Take 81 mg by mouth 2 (two) times daily.    [provider]  benztropine (COGENTIN) 0.5 MG tablet Take 0.5 mg by mouth 2 (two) times daily.      [provider]  calcium-vitamin D (OSCAL WITH D) 500-200  MG-UNIT tablet Take 1 tablet by mouth 3 (three) times daily. Patient taking differently: Take 2 tablets by mouth daily.  02/25/15   [name], [name], MD  cetirizine (ZYRTEC) 10 MG tablet TAKE 1 TABLET BY MOUTH DAILY 11/02/15   [name], [name], MD  clindamycin (CLEOCIN) 300 MG capsule Take 600 mg by mouth See admin instructions. 1 HOUR PRIOR TO DENTAL APPOINTMENTS    [provider]  gabapentin (NEURONTIN) 100 MG capsule Take 100 mg by mouth 3 (three) times daily.  04/20/16   [provider]  HYDROcodone-acetaminophen (NORCO) 5-325 MG tablet Take 1-2  tablets by mouth every 4 (four) hours as needed for moderate pain. 09/08/16   Grier Mitts, PA-C  hydrOXYzine (ATARAX/VISTARIL) 10 MG tablet Take 1 tablet (10 mg total) by mouth 2 (two) times daily as needed. Patient taking differently: Take 10 mg by mouth 2 (two) times daily as needed for anxiety.  12/11/13   Biagio Borg, MD  ipratropium (ATROVENT) 0.06 % nasal spray USE 1 SPRAY IN EACH NOSTRIL THREE TIMES DAILY Patient taking differently: USE 1 SPRAY IN EACH NOSTRIL THREE TIMES DAILY AS NEEDED FOR ALLERGIES/SINUS 07/26/15   Biagio Borg, MD  metFORMIN (GLUCOPHAGE) 500 MG tablet Take 500 mg by mouth 2 (two) times daily.  05/18/16   [provider]  Multiple Vitamin (MULTIVITAMIN WITH MINERALS) TABS tablet Take 1 tablet by mouth daily.    [provider]  [facility] DELICA LANCETS 87G MISC CHECK BLOOD SUGAR ONCE DAILY AS DIRECTED 06/23/15   Biagio Borg, MD  pantoprazole (PROTONIX) 40 MG tablet TAKE 1 TABLET BY MOUTH DAILY Patient taking differently: TAKE 1 TABLET BY MOUTH TWICE DAILY 11/16/15   Biagio Borg, MD  Potassium Chloride ER 20 MEQ TBCR Take 20 mEq by mouth 2 (two) times daily. 07/04/16   [provider]  pravastatin (PRAVACHOL) 20 MG tablet Take 20 mg by mouth daily. 08/03/16   [provider]  risperiDONE (RISPERDAL) 3 MG tablet Take 3 mg by mouth at bedtime.     [provider]   traMADol (ULTRAM) 50 MG tablet Take 50 mg by mouth every 6 (six) hours as needed (for pain.).  06/02/16   [provider]  UREA 20 INTENSIVE HYDRATING 20 % cream Apply 1 application topically daily as needed. For dry skin 08/07/16   [provider]    Allergies  Allergen Reactions  . Penicillins Other (See Comments)    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 PATIENT HAS HAD A PCN REACTION WITH IMMEDIATE RASH, FACIAL/TONGUE/THROAT SWELLING, SOB, OR LIGHTHEADEDNESS WITH HYPOTENSION:  #  #  #  YES  #  #  #   Has patient had a PCN reaction causing severe rash involving mucus membranes or skin necrosis: Unknown Has patient had a PCN reaction that required hospitalization: Unknown Has patient had a PCN reaction occurring within the last 10 years: Unknown   . Pollen Extract     UNSPECIFIED REACTION   . Coconut Oil Itching    UNSPECIFIED REACTION   . Mellaril [Thioridazine] Itching  . Thioridazine Hcl Itching    ROS:  General: no fevers/chills/night sweats Eyes: no blurry vision, diplopia, or amaurosis ENT: no sore throat or hearing loss Resp: no cough, wheezing, or hemoptysis CV: no edema or palpitations GI: no abdominal pain, nausea, vomiting, diarrhea, or constipation GU: no dysuria, frequency, or hematuria Skin: no rash Neuro: no headache, numbness, tingling, or weakness of extremities. Positive for memory loss. Musculoskeletal: positive for back pain, shoulder pain, knee pain Heme: no bleeding, DVT, or easy bruising Endo: no polydipsia or polyuria  BP 126/78   Pulse 68   Ht 4' 10.5" (1.486 m)   Wt 149 lb 1.9 oz (67.6 kg)   SpO2 97%   BMI 30.64 kg/m   PHYSICAL EXAM: Pt is alert and oriented, elderly woman, in no distress. HEENT: normal Neck: JVP normal. Carotid upstrokes normal with bilateral bruits. No thyromegaly. Lungs: equal expansion, clear bilaterally CV: Apex is discrete and nondisplaced, RRR with grade 3/6 harsh systolic murmur at the RUSB, no  diastolic murmur Abd: soft,  NT, +BS, no bruit, no hepatosplenomegaly Back: no CVA tenderness Ext: no C/C/E        DP/PT pulses intact and = Skin: warm and dry without rash Neuro: CNII-XII intact             Strength intact = bilaterally  EKG:  Not performed  2D ECHO: LVEF 60-65%, moderately thickened Ao valve with peak and mean gradients 84 and 54 mmHg, respectively. Trace MR.  CARDIAC CATH: pending  STS RISK CALCULATOR: Isolated AVR Risk of Mortality: 1.419% Renal Failure: 0.645% Permanent Stroke: 1.461% Prolonged Ventilation: 5.576% DSW Infection: 0.085% Reoperation: 3.079% Morbidity or Mortality: 9.613% Short Length of Stay: 39.024% Long Length of Stay: 4.468%   ASSESSMENT AND PLAN: 73 yo woman with severe, stage D1, aortic stenosis  The patient describes New York Heart Association functional class III symptoms of chronic diastolic heart failure, likely associated with severe aortic stenosis.  Her echo images are not currently available for review, but the report describes severe/critical aortic stenosis with a mean transvalvular gradient of 54 mmHg and calculated aortic valve area of 0.4 cm.  Fortunately the patient does not have symptoms of resting dyspnea, orthopnea, or syncope.  I have reviewed the natural history of aortic stenosis with the patient and her close friend who is present today. We have discussed the limitations of medical therapy and the poor prognosis associated with symptomatic aortic stenosis. We have also reviewed potential treatment options, including palliative medical therapy, conventional surgical aortic valve replacement, and transcatheter aortic valve replacement. We discussed treatment options in the context of this patient's specific comorbid medical conditions.   The patient would like to proceed with further evaluation which will include:  Right and left heart catheterization.  This will be performed to assess for concomitant CAD as well as hemodynamic  evaluation of right heart pressures.  I have reviewed the risks, indications, and alternatives to cardiac catheterization, possible angioplasty, and stenting with the patient. Risks include but are not limited to bleeding, infection, vascular injury, stroke, myocardial infection, arrhythmia, kidney injury, radiation-related injury in the case of prolonged fluoroscopy use, emergency cardiac surgery, and death. The patient understands the risks of serious complication is 1-2 in 1000 with diagnostic cardiac cath and 1-2% or less with angioplasty/stenting.   CT angiography of the heart and CTA of the chest, abdomen, and pelvis.  The studies will be performed to evaluate anatomic suitability for TAVR.  Updated dental evaluation. She will contact her dentist to arrange  After the patient's cardiac catheterization and CTA studies are completed, will request formal cardiac surgical consultation as part of a multidisciplinary heart team approach to her care.  While she is functionally independent with a good support structure in place including her close personal friend who brings her to the office visits and checks on her regularly as well as a home health aide, she clearly has at least early dementia.  Considering her underlying dementia, diabetes, and advanced arthritis with limited functional capacity, I suspect TAVR will be the preferred treatment modality as long as she has suitable anatomy.  All of the patient's questions were answered today. Will make further recommendations based on the results of studies outlined above.   Tonny BollmanMichael Marit Goodwill, MD  08/19/2018 10:11 AM    [facility] [address], [place], [place]  [address] Phone: [phone]; Fax: [phone](336) [phone]   ADDENDUM 09-18-2018: [name] case is reviewed with the multidisciplinary heart team after she underwent repeat echo and right and  left heart catheterization.  The patient has findings consistent with moderate aortic  stenosis and two-vessel obstructive coronary artery disease.  She has no anginal symptoms and has preserved LV function.  After review of her case, we have elected to follow her clinically.  I would like to see her back in the office in 6 months with a repeat echocardiogram.  Plan discussed with her primary cardiologist, Arnette FeltsMike Duran, PA.   Tonny BollmanMichael Loan Oguin 09/18/2018 2:00 PM

## 2018-08-19 NOTE — Patient Instructions (Addendum)
Medication Instructions:  Your provider recommends that you continue on your current medications as directed. Please refer to the Current Medication list given to you today.    Labwork: TODAY!  You are scheduled for your COVID screen on 09/07/2018 at 12:30PM. This is located at [facility].  Testing/Procedures: Your physician has requested that you have a cardiac catheterization. Cardiac catheterization is used to diagnose and/or treat various heart conditions. Doctors may recommend this procedure for a number of different reasons. The most common reason is to evaluate chest pain. Chest pain can be a symptom of coronary artery disease (CAD), and cardiac catheterization can show whether plaque is narrowing or blocking your heart's arteries. This procedure is also used to evaluate the valves, as well as measure the blood flow and oxygen levels in different parts of your heart. For further information please visit [email or web address]. Please follow instruction sheet, as given.   Follow-Up: Please make an appointment with your dentist!  We will contact you for further follow-up!   CATHETERIZATION INSTRUCTIONS:  You are scheduled for a Cardiac Catheterization on Wednesday, September 11, 2018 with Dr. Sherren Mocha.  1. Please arrive at the [facility] (Main Entrance A) at [facility]: [address] [place] [address] at 9:30AM (This time is two hours before your procedure to ensure your preparation). Please wear a mask upon arrival to the hospital. Currently you are allowed 1 visitor who will stay in the waiting room for the duration of your stay. Special note: Every effort is made to have your procedure done on time. Please understand that emergencies sometimes delay scheduled procedures.  2. Diet: Do not eat solid foods after midnight.  You may have clear liquids until 5am upon the day of the procedure.  3. Labs: TODAY!  4. Medication instructions in preparation for your  procedure:  1) HOLD METFORMIN the morning of your procedure and 48 hours after your procedure.  2) MAKE SURE TO TAKE YOUR ASPIRIN the morning of your procedure.  3) You may take your other medications as directed with sips of water.   5. Plan for one night stay--bring personal belongings. 6. Bring a current list of your medications and current insurance cards. 7. You MUST have a responsible person to drive you home. 8. Someone MUST be with you the first 24 hours after you arrive home or your discharge will be delayed. 9. Please wear clothes that are easy to get on and off and wear slip-on shoes.  Thank you for allowing [place] to care for you!   -- [facility]'

## 2018-08-19 NOTE — H&P (View-Only) (Signed)
Patient ID: Katherine [name] MRN: [id number] DOB/AGE: [birth year] 73 y.o.  Primary Care Physician:Duran, Otelia Limes, PA-C Primary Cardiologist: Isaias Cowman, PA-C and Lawson Radar, MD  HPI:  The patient is here with her close friend today who takes her to all of her medical appointments. She lives alone and has not been married. The patient has never had her driver's license. She worked at [employer], now retired for many years. Her friend also checks in on her regularly and reports that they talk on the phone 2-3 times every day. The patient has an aide who is there Mon-Saturday and helps with meal preparation and bathing.   She has had a heart murmur for many years. She doesn't recall a clinical history of rheumatic fever or endocarditis. The patient reports shortness of breath with low level activity. She also reports chest pain when she gets emotionally upset. She sleeps well and denies orthopnea or PND. She denies leg swelling. She complains of 'tremors' and 'shakiness.' The patient carries a diagnosis of paroxysmal atrial fibrillation, but she has not been on anticoagulant drugs. This may have been remote as the patient does not recall having any issues with atrial fibrillation over the past few years. She denies any history of syncope. The patient has advanced arthritis and has had both knee and shoulder replacement. She ambulates with a cane and has a history of falls, but none recently.   The patient has had regular dental care, but reports problems with her teeth and states that she needs 'some work done.'   Past Medical History:  Diagnosis Date  . ABDOMINAL PAIN, LOWER 02/04/2010  . ABNORMAL THYROID FUNCTION TESTS 08/04/2009  . ALLERGIC RHINITIS 09/05/2006  . ANXIETY 07/31/2007  . Aortic stenosis    followed at [facility]; 06/14/16 EF 55-60%, mod-severe AS, peak/mean grad 26.46 mmHg/ 16.31 mmHg, AVA by continuity eq 0.90 cm2  . Blood transfusion   .  Cataract    resolved -surgery done  . DEPRESSION 09/05/2006  . DIABETES MELLITUS, TYPE II 07/31/2007   no meds  . Diabetic foot ulcer ([facility]) 10/26/2014  . DIVERTICULOSIS, COLON 09/05/2006  . Dyspnea   . Dysrhythmia    patient had a "irregular beat that is being monitored by pcp"  . Dysuria 04/15/2010  . Failure to thrive in childhood 04/06/2010  . FATIGUE 04/15/2010  . FIBROMYALGIA 05/10/2009  . FRACTURE, PELVIS, RIGHT 04/15/2010  . GASTROENTERITIS, ACUTE 12/25/2008  . GERD 09/05/2006  . Heart murmur   . HYPERLIPIDEMIA 09/05/2006  . HYPERTENSION 09/05/2006  . KNEE PAIN, LEFT 02/04/2010  . MRSA infection 10/26/2014  . OSTEOARTHRITIS 09/05/2006  . OSTEOPOROSIS 07/31/2007  . Other specified forms of hearing loss 11/01/2009  . SINUSITIS- ACUTE-NOS 10/09/2007  . SKIN LESION 05/10/2009  . SPRAIN&STRAIN OTH SPEC SITES SHOULDER&UPPER ARM 11/01/2009  . Unspecified hearing loss 10/09/2007  . URI 02/20/2007  . UTI 02/20/2007    Past Surgical History:  Procedure Laterality Date  . ABDOMINAL HYSTERECTOMY  12/08   Bladder tac, partial hysterectomy  . BREAST BIOPSY    . catarct removal     both eyes  . COLONOSCOPY    . I&D KNEE WITH POLY EXCHANGE Left 01/03/2013   Procedure: IRRIGATION AND DEBRIDEMENT LEFT KNEE WITH POLY EXCHANGE;  Surgeon: Mcarthur Rossetti, MD;  Location: [facility];  Service: Orthopedics;  Laterality: Left;  . inguinal herniorrhapy     right Dr. [name] 06/2009  . REVERSE SHOULDER ARTHROPLASTY Right 09/07/2016  . REVERSE  SHOULDER ARTHROPLASTY Right 09/07/2016   Procedure: REVERSE SHOULDER ARTHROPLASTY;  Surgeon: Jones Broomhandler, Justin, MD;  [facility];  Service: Orthopedics;  Laterality: Right;  Right reverse shoulder arthroplasty  . TONSILLECTOMY    . TOTAL KNEE ARTHROPLASTY Left 12/20/2012   Procedure: LEFT TOTAL KNEE ARTHROPLASTY;  Surgeon: Kathryne Hitchhristopher Y Blackman, MD;  Location: [facility];  Service: Orthopedics;  Laterality: Left;    Family History  Problem Relation Age of Onset  . Coronary artery  disease Other   . Diabetes Other   . Colon cancer Neg Hx   . Esophageal cancer Neg Hx   . Stomach cancer Neg Hx   . Rectal cancer Neg Hx     Social History   Socioeconomic History  . Marital status: Single    Spouse name: Not on file  . Number of children: Not on file  . Years of education: Not on file  . Highest education level: Not on file  Occupational History  . Occupation: retired [employer]    Employer: DISABLED  Social Needs  . Financial resource strain: Not on file  . Food insecurity    Worry: Not on file    Inability: Not on file  . Transportation needs    Medical: Not on file    Non-medical: Not on file  Tobacco Use  . Smoking status: Never Smoker  . Smokeless tobacco: Never Used  Substance and Sexual Activity  . Alcohol use: No    Alcohol/week: 0.0 standard drinks  . Drug use: No  . Sexual activity: Not on file  Lifestyle  . Physical activity    Days per week: Not on file    Minutes per session: Not on file  . Stress: Not on file  Relationships  . Social Musicianconnections    Talks on phone: Not on file    Gets together: Not on file    Attends religious service: Not on file    Active member of club or organization: Not on file    Attends meetings of clubs or organizations: Not on file    Relationship status: Not on file  . Intimate partner violence    Fear of current or ex partner: Not on file    Emotionally abused: Not on file    Physically abused: Not on file    Forced sexual activity: Not on file  Other Topics Concern  . Not on file  Social History Narrative   LIves alone with home health services.     Prior to Admission medications   Medication Sig Start Date End Date Taking? Authorizing Provider  aspirin EC 81 MG tablet Take 81 mg by mouth 2 (two) times daily.    [provider]  benztropine (COGENTIN) 0.5 MG tablet Take 0.5 mg by mouth 2 (two) times daily.      [provider]  calcium-vitamin D (OSCAL WITH D) 500-200  MG-UNIT tablet Take 1 tablet by mouth 3 (three) times daily. Patient taking differently: Take 2 tablets by mouth daily.  02/25/15   [name], [name], MD  cetirizine (ZYRTEC) 10 MG tablet TAKE 1 TABLET BY MOUTH DAILY 11/02/15   [name], [name], MD  clindamycin (CLEOCIN) 300 MG capsule Take 600 mg by mouth See admin instructions. 1 HOUR PRIOR TO DENTAL APPOINTMENTS    [provider]  gabapentin (NEURONTIN) 100 MG capsule Take 100 mg by mouth 3 (three) times daily.  04/20/16   [provider]  HYDROcodone-acetaminophen (NORCO) 5-325 MG tablet Take 1-2  tablets by mouth every 4 (four) hours as needed for moderate pain. 09/08/16   Grier Mitts, PA-C  hydrOXYzine (ATARAX/VISTARIL) 10 MG tablet Take 1 tablet (10 mg total) by mouth 2 (two) times daily as needed. Patient taking differently: Take 10 mg by mouth 2 (two) times daily as needed for anxiety.  12/11/13   Biagio Borg, MD  ipratropium (ATROVENT) 0.06 % nasal spray USE 1 SPRAY IN EACH NOSTRIL THREE TIMES DAILY Patient taking differently: USE 1 SPRAY IN EACH NOSTRIL THREE TIMES DAILY AS NEEDED FOR ALLERGIES/SINUS 07/26/15   Biagio Borg, MD  metFORMIN (GLUCOPHAGE) 500 MG tablet Take 500 mg by mouth 2 (two) times daily.  05/18/16   [provider]  Multiple Vitamin (MULTIVITAMIN WITH MINERALS) TABS tablet Take 1 tablet by mouth daily.    [provider]  [facility] DELICA LANCETS 87G MISC CHECK BLOOD SUGAR ONCE DAILY AS DIRECTED 06/23/15   Biagio Borg, MD  pantoprazole (PROTONIX) 40 MG tablet TAKE 1 TABLET BY MOUTH DAILY Patient taking differently: TAKE 1 TABLET BY MOUTH TWICE DAILY 11/16/15   Biagio Borg, MD  Potassium Chloride ER 20 MEQ TBCR Take 20 mEq by mouth 2 (two) times daily. 07/04/16   [provider]  pravastatin (PRAVACHOL) 20 MG tablet Take 20 mg by mouth daily. 08/03/16   [provider]  risperiDONE (RISPERDAL) 3 MG tablet Take 3 mg by mouth at bedtime.     [provider]   traMADol (ULTRAM) 50 MG tablet Take 50 mg by mouth every 6 (six) hours as needed (for pain.).  06/02/16   [provider]  UREA 20 INTENSIVE HYDRATING 20 % cream Apply 1 application topically daily as needed. For dry skin 08/07/16   [provider]    Allergies  Allergen Reactions  . Penicillins Other (See Comments)    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 PATIENT HAS HAD A PCN REACTION WITH IMMEDIATE RASH, FACIAL/TONGUE/THROAT SWELLING, SOB, OR LIGHTHEADEDNESS WITH HYPOTENSION:  #  #  #  YES  #  #  #   Has patient had a PCN reaction causing severe rash involving mucus membranes or skin necrosis: Unknown Has patient had a PCN reaction that required hospitalization: Unknown Has patient had a PCN reaction occurring within the last 10 years: Unknown   . Pollen Extract     UNSPECIFIED REACTION   . Coconut Oil Itching    UNSPECIFIED REACTION   . Mellaril [Thioridazine] Itching  . Thioridazine Hcl Itching    ROS:  General: no fevers/chills/night sweats Eyes: no blurry vision, diplopia, or amaurosis ENT: no sore throat or hearing loss Resp: no cough, wheezing, or hemoptysis CV: no edema or palpitations GI: no abdominal pain, nausea, vomiting, diarrhea, or constipation GU: no dysuria, frequency, or hematuria Skin: no rash Neuro: no headache, numbness, tingling, or weakness of extremities. Positive for memory loss. Musculoskeletal: positive for back pain, shoulder pain, knee pain Heme: no bleeding, DVT, or easy bruising Endo: no polydipsia or polyuria  BP 126/78   Pulse 68   Ht 4' 10.5" (1.486 m)   Wt 149 lb 1.9 oz (67.6 kg)   SpO2 97%   BMI 30.64 kg/m   PHYSICAL EXAM: Pt is alert and oriented, elderly woman, in no distress. HEENT: normal Neck: JVP normal. Carotid upstrokes normal with bilateral bruits. No thyromegaly. Lungs: equal expansion, clear bilaterally CV: Apex is discrete and nondisplaced, RRR with grade 3/6 harsh systolic murmur at the RUSB, no  diastolic murmur Abd: soft,  NT, +BS, no bruit, no hepatosplenomegaly Back: no CVA tenderness Ext: no C/C/E        DP/PT pulses intact and = Skin: warm and dry without rash Neuro: CNII-XII intact             Strength intact = bilaterally  EKG:  Not performed  2D ECHO: LVEF 60-65%, moderately thickened Ao valve with peak and mean gradients 84 and 54 mmHg, respectively. Trace MR.  CARDIAC CATH: pending  STS RISK CALCULATOR: Isolated AVR Risk of Mortality: 1.419% Renal Failure: 0.645% Permanent Stroke: 1.461% Prolonged Ventilation: 5.576% DSW Infection: 0.085% Reoperation: 3.079% Morbidity or Mortality: 9.613% Short Length of Stay: 39.024% Long Length of Stay: 4.468%   ASSESSMENT AND PLAN: 73 yo woman with severe, stage D1, aortic stenosis  The patient describes New York Heart Association functional class III symptoms of chronic diastolic heart failure, likely associated with severe aortic stenosis.  Her echo images are not currently available for review, but the report describes severe/critical aortic stenosis with a mean transvalvular gradient of 54 mmHg and calculated aortic valve area of 0.4 cm.  Fortunately the patient does not have symptoms of resting dyspnea, orthopnea, or syncope.  I have reviewed the natural history of aortic stenosis with the patient and her close friend who is present today. We have discussed the limitations of medical therapy and the poor prognosis associated with symptomatic aortic stenosis. We have also reviewed potential treatment options, including palliative medical therapy, conventional surgical aortic valve replacement, and transcatheter aortic valve replacement. We discussed treatment options in the context of this patient's specific comorbid medical conditions.   The patient would like to proceed with further evaluation which will include:  Right and left heart catheterization.  This will be performed to assess for concomitant CAD as well as hemodynamic  evaluation of right heart pressures.  I have reviewed the risks, indications, and alternatives to cardiac catheterization, possible angioplasty, and stenting with the patient. Risks include but are not limited to bleeding, infection, vascular injury, stroke, myocardial infection, arrhythmia, kidney injury, radiation-related injury in the case of prolonged fluoroscopy use, emergency cardiac surgery, and death. The patient understands the risks of serious complication is 1-2 in 1000 with diagnostic cardiac cath and 1-2% or less with angioplasty/stenting.   CT angiography of the heart and CTA of the chest, abdomen, and pelvis.  The studies will be performed to evaluate anatomic suitability for TAVR.  Updated dental evaluation. She will contact her dentist to arrange  After the patient's cardiac catheterization and CTA studies are completed, will request formal cardiac surgical consultation as part of a multidisciplinary heart team approach to her care.  While she is functionally independent with a good support structure in place including her close personal friend who brings her to the office visits and checks on her regularly as well as a home health aide, she clearly has at least early dementia.  Considering her underlying dementia, diabetes, and advanced arthritis with limited functional capacity, I suspect TAVR will be the preferred treatment modality as long as she has suitable anatomy.  All of the patient's questions were answered today. Will make further recommendations based on the results of studies outlined above.   Tonny BollmanMichael Louana Fontenot, MD  08/19/2018 10:11 AM    [facility] [address], [place], [place]  [address] Phone: [phone]) [phone]; Fax: [phone]) [phone]

## 2018-09-05 ENCOUNTER — Other Ambulatory Visit: Payer: Self-pay

## 2018-09-05 ENCOUNTER — Ambulatory Visit (HOSPITAL_COMMUNITY)
Admission: RE | Admit: 2018-09-05 | Discharge: 2018-09-05 | Disposition: A | Discharge status: Home / Self Care | Payer: Medicare Other | Source: Ambulatory Visit | Attending: Cardiology | Admitting: Cardiology

## 2018-09-05 DIAGNOSIS — I350 Nonrheumatic aortic (valve) stenosis: Secondary | ICD-10-CM

## 2018-09-07 ENCOUNTER — Other Ambulatory Visit (HOSPITAL_COMMUNITY)
Admission: RE | Admit: 2018-09-07 | Discharge: 2018-09-07 | Disposition: A | Discharge status: Home / Self Care | Payer: Medicare Other | Source: Ambulatory Visit | Attending: Cardiovascular Disease | Admitting: Cardiovascular Disease

## 2018-09-07 DIAGNOSIS — Z01812 Encounter for preprocedural laboratory examination: Secondary | ICD-10-CM | POA: Diagnosis present

## 2018-09-07 DIAGNOSIS — Z20828 Contact with and (suspected) exposure to other viral communicable diseases: Secondary | ICD-10-CM | POA: Diagnosis not present

## 2018-09-08 LAB — SARS CORONAVIRUS 2 (TAT 6-24 HRS): SARS Coronavirus 2: NEGATIVE

## 2018-09-10 ENCOUNTER — Telehealth: Payer: Self-pay | Admitting: *Deleted

## 2018-09-10 NOTE — Telephone Encounter (Signed)
Pt contacted pre-catheterization scheduled at [facility] for: Wednesday September 11, 2018 11:30 AM Verified arrival time and place: [facility]) at: 9:30 AM   No solid food after midnight prior to cath, clear liquids until 5 AM day of procedure. Contrast allergy: no  Hold: Metformin-day of procedure and 48 hours post procedure.  Except hold medications AM meds can be  taken pre-cath with sip of water including: ASA 81 mg   Confirmed patient has responsible person to drive home post procedure and observe 24 hours after arriving home: yes Letta Median (friend, DPR) can drive patient home and  is working on arrangements for someone to be with patient 24 hours after arriving home.   Due to Covid-19 pandemic, only one support person will be allowed with patient. Must be the same support person for that patient's entire stay, will be screened and required to wear a mask.   Patients are required to wear a mask when they enter the hospital.      COVID-19 Pre-Screening Questions:  . In the past 7 to 10 days have you had a cough,  shortness of breath, headache, congestion, fever (100 or greater) body aches, chills, sore throat, or sudden loss of taste or sense of smell? no . Have you been around anyone with known Covid 19? no . Have you been around anyone who is awaiting Covid 19 test results in the past 7 to 10 days? no . Have you been around anyone who has been exposed to Covid 19, or has mentioned symptoms of Covid 19 within the past 7 to 10 days? no  I reviewed procedure/mask/visitor, Covid-19 screening questions with patient, she verbalized understanding.  I reviewed procedure, mask, visitor instructions with patient's friend, Seth Bake [facility]), she verbalized understanding. Letta Median states she will review medication instructions for procedure with patient and remove metformin from pt's  pill organizer for tomorrow and the next 2 days (48 hours).

## 2018-09-11 ENCOUNTER — Other Ambulatory Visit: Payer: Self-pay | Admitting: Physician Assistant

## 2018-09-11 ENCOUNTER — Encounter (HOSPITAL_COMMUNITY)
Admission: RE | Disposition: A | Discharge status: Home / Self Care | Payer: Self-pay | Source: Home / Self Care | Attending: Cardiovascular Disease

## 2018-09-11 ENCOUNTER — Encounter: Payer: Self-pay | Admitting: Physician Assistant

## 2018-09-11 ENCOUNTER — Ambulatory Visit (HOSPITAL_COMMUNITY)
Admission: RE | Admit: 2018-09-11 | Discharge: 2018-09-11 | Disposition: A | Discharge status: Home / Self Care | Payer: Medicare Other | Attending: Cardiovascular Disease | Admitting: Cardiovascular Disease

## 2018-09-11 ENCOUNTER — Other Ambulatory Visit: Payer: Self-pay

## 2018-09-11 ENCOUNTER — Ambulatory Visit (HOSPITAL_BASED_OUTPATIENT_CLINIC_OR_DEPARTMENT_OTHER): Payer: Medicare Other

## 2018-09-11 DIAGNOSIS — Z7984 Long term (current) use of oral hypoglycemic drugs: Secondary | ICD-10-CM | POA: Diagnosis not present

## 2018-09-11 DIAGNOSIS — E1136 Type 2 diabetes mellitus with diabetic cataract: Secondary | ICD-10-CM | POA: Insufficient documentation

## 2018-09-11 DIAGNOSIS — Z96652 Presence of left artificial knee joint: Secondary | ICD-10-CM | POA: Diagnosis not present

## 2018-09-11 DIAGNOSIS — K219 Gastro-esophageal reflux disease without esophagitis: Secondary | ICD-10-CM | POA: Diagnosis not present

## 2018-09-11 DIAGNOSIS — Z7982 Long term (current) use of aspirin: Secondary | ICD-10-CM | POA: Diagnosis not present

## 2018-09-11 DIAGNOSIS — M797 Fibromyalgia: Secondary | ICD-10-CM | POA: Insufficient documentation

## 2018-09-11 DIAGNOSIS — Z90710 Acquired absence of both cervix and uterus: Secondary | ICD-10-CM | POA: Diagnosis not present

## 2018-09-11 DIAGNOSIS — I480 Paroxysmal atrial fibrillation: Secondary | ICD-10-CM | POA: Insufficient documentation

## 2018-09-11 DIAGNOSIS — Z880 Allergy status to penicillin: Secondary | ICD-10-CM | POA: Insufficient documentation

## 2018-09-11 DIAGNOSIS — I10 Essential (primary) hypertension: Secondary | ICD-10-CM | POA: Diagnosis not present

## 2018-09-11 DIAGNOSIS — I350 Nonrheumatic aortic (valve) stenosis: Secondary | ICD-10-CM | POA: Diagnosis present

## 2018-09-11 DIAGNOSIS — Z8249 Family history of ischemic heart disease and other diseases of the circulatory system: Secondary | ICD-10-CM | POA: Insufficient documentation

## 2018-09-11 DIAGNOSIS — Z79899 Other long term (current) drug therapy: Secondary | ICD-10-CM | POA: Diagnosis not present

## 2018-09-11 DIAGNOSIS — M1990 Unspecified osteoarthritis, unspecified site: Secondary | ICD-10-CM | POA: Insufficient documentation

## 2018-09-11 DIAGNOSIS — H918X9 Other specified hearing loss, unspecified ear: Secondary | ICD-10-CM | POA: Insufficient documentation

## 2018-09-11 DIAGNOSIS — E785 Hyperlipidemia, unspecified: Secondary | ICD-10-CM | POA: Insufficient documentation

## 2018-09-11 DIAGNOSIS — Z833 Family history of diabetes mellitus: Secondary | ICD-10-CM | POA: Insufficient documentation

## 2018-09-11 DIAGNOSIS — I2510 Atherosclerotic heart disease of native coronary artery without angina pectoris: Secondary | ICD-10-CM | POA: Insufficient documentation

## 2018-09-11 DIAGNOSIS — I2584 Coronary atherosclerosis due to calcified coronary lesion: Secondary | ICD-10-CM | POA: Diagnosis not present

## 2018-09-11 DIAGNOSIS — I352 Nonrheumatic aortic (valve) stenosis with insufficiency: Secondary | ICD-10-CM | POA: Insufficient documentation

## 2018-09-11 HISTORY — PX: RIGHT/LEFT HEART CATH AND CORONARY ANGIOGRAPHY: CATH118266

## 2018-09-11 LAB — POCT I-STAT EG7
Acid-base deficit: 2 mmol/L (ref 0.0–2.0)
Bicarbonate: 25.5 mmol/L (ref 20.0–28.0)
Calcium, Ion: 1.28 mmol/L (ref 1.15–1.40)
HCT: 38 % (ref 36.0–46.0)
Hemoglobin: 12.9 g/dL (ref 12.0–15.0)
O2 Saturation: 74 %
Potassium: 5 mmol/L (ref 3.5–5.1)
Sodium: 140 mmol/L (ref 135–145)
TCO2: 27 mmol/L (ref 22–32)
pCO2, Ven: 52 mmHg (ref 44.0–60.0)
pH, Ven: 7.297 (ref 7.250–7.430)
pO2, Ven: 44 mmHg (ref 32.0–45.0)

## 2018-09-11 LAB — POCT I-STAT 7, (LYTES, BLD GAS, ICA,H+H)
Acid-base deficit: 2 mmol/L (ref 0.0–2.0)
Bicarbonate: 24.7 mmol/L (ref 20.0–28.0)
Calcium, Ion: 1.29 mmol/L (ref 1.15–1.40)
HCT: 39 % (ref 36.0–46.0)
Hemoglobin: 13.3 g/dL (ref 12.0–15.0)
O2 Saturation: 98 %
Potassium: 5.2 mmol/L — ABNORMAL HIGH (ref 3.5–5.1)
Sodium: 139 mmol/L (ref 135–145)
TCO2: 26 mmol/L (ref 22–32)
pCO2 arterial: 48.1 mmHg — ABNORMAL HIGH (ref 32.0–48.0)
pH, Arterial: 7.317 — ABNORMAL LOW (ref 7.350–7.450)
pO2, Arterial: 113 mmHg — ABNORMAL HIGH (ref 83.0–108.0)

## 2018-09-11 LAB — GLUCOSE, CAPILLARY: Glucose-Capillary: 103 mg/dL — ABNORMAL HIGH (ref 70–99)

## 2018-09-11 LAB — ECHOCARDIOGRAM COMPLETE
Height: 58.5 in
Weight: 2304 oz

## 2018-09-11 SURGERY — RIGHT/LEFT HEART CATH AND CORONARY ANGIOGRAPHY
Anesthesia: LOCAL

## 2018-09-11 MED ORDER — LIDOCAINE HCL (PF) 1 % IJ SOLN
INTRAMUSCULAR | Status: AC
  Filled 2018-09-11: qty 30

## 2018-09-11 MED ORDER — HEPARIN SODIUM (PORCINE) 1000 UNIT/ML IJ SOLN
INTRAMUSCULAR | Status: DC | PRN
  Administered 2018-09-11: 3500 [IU] via INTRAVENOUS

## 2018-09-11 MED ORDER — HEPARIN (PORCINE) IN NACL 1000-0.9 UT/500ML-% IV SOLN
INTRAVENOUS | Status: AC
  Filled 2018-09-11: qty 1000

## 2018-09-11 MED ORDER — ONDANSETRON HCL 4 MG/2ML IJ SOLN: 4.0000 mg | Freq: Four times a day (QID) | INTRAMUSCULAR | Status: DC | PRN

## 2018-09-11 MED ORDER — SODIUM CHLORIDE 0.9 % WEIGHT BASED INFUSION: 1.0000 mL/kg/h | INTRAVENOUS | Status: DC

## 2018-09-11 MED ORDER — SODIUM CHLORIDE 0.9% FLUSH: 3.0000 mL | INTRAVENOUS | Status: DC | PRN

## 2018-09-11 MED ORDER — ACETAMINOPHEN 325 MG PO TABS: 650.0000 mg | ORAL_TABLET | ORAL | Status: DC | PRN

## 2018-09-11 MED ORDER — HEPARIN (PORCINE) IN NACL 1000-0.9 UT/500ML-% IV SOLN
INTRAVENOUS | Status: DC | PRN
  Administered 2018-09-11 (×2): 500 mL

## 2018-09-11 MED ORDER — LABETALOL HCL 5 MG/ML IV SOLN: 10.0000 mg | INTRAVENOUS | Status: DC | PRN

## 2018-09-11 MED ORDER — LIDOCAINE HCL (PF) 1 % IJ SOLN
INTRAMUSCULAR | Status: DC | PRN
  Administered 2018-09-11 (×2): 2 mL

## 2018-09-11 MED ORDER — SODIUM CHLORIDE 0.9% FLUSH: 3.0000 mL | Freq: Two times a day (BID) | INTRAVENOUS | Status: DC

## 2018-09-11 MED ORDER — VERAPAMIL HCL 2.5 MG/ML IV SOLN
INTRAVENOUS | Status: DC | PRN
  Administered 2018-09-11: 10 mL via INTRA_ARTERIAL

## 2018-09-11 MED ORDER — SODIUM CHLORIDE 0.9 % IV SOLN: 250.0000 mL | INTRAVENOUS | Status: DC | PRN

## 2018-09-11 MED ORDER — IOHEXOL 350 MG/ML SOLN
INTRAVENOUS | Status: DC | PRN
  Administered 2018-09-11: 50 mL via INTRA_ARTERIAL

## 2018-09-11 MED ORDER — SODIUM CHLORIDE 0.9 % WEIGHT BASED INFUSION
3.0000 mL/kg/h | INTRAVENOUS | Status: DC
  Administered 2018-09-11: 12:00:00 3 mL/kg/h via INTRAVENOUS

## 2018-09-11 MED ORDER — VERAPAMIL HCL 2.5 MG/ML IV SOLN
INTRAVENOUS | Status: AC
  Filled 2018-09-11: qty 2

## 2018-09-11 MED ORDER — HYDRALAZINE HCL 20 MG/ML IJ SOLN: 10.0000 mg | INTRAMUSCULAR | Status: DC | PRN

## 2018-09-11 MED ORDER — ASPIRIN 81 MG PO CHEW: 81.0000 mg | CHEWABLE_TABLET | ORAL | Status: DC

## 2018-09-11 SURGICAL SUPPLY — 13 items
CATH 5FR JL3.5 JR4 ANG PIG MP (CATHETERS) ×1 IMPLANT
CATH BALLN WEDGE 5F 110CM (CATHETERS) ×1 IMPLANT
DEVICE RAD COMP TR BAND LRG (VASCULAR PRODUCTS) ×1 IMPLANT
GLIDESHEATH SLEND SS 6F .021 (SHEATH) ×1 IMPLANT
GUIDEWIRE .025 260CM (WIRE) ×1 IMPLANT
GUIDEWIRE INQWIRE 1.5J.035X260 (WIRE) IMPLANT
INQWIRE 1.5J .035X260CM (WIRE) ×2
KIT HEART LEFT (KITS) ×2 IMPLANT
PACK CARDIAC CATHETERIZATION (CUSTOM PROCEDURE TRAY) ×2 IMPLANT
SHEATH GLIDE SLENDER 4/5FR (SHEATH) ×1 IMPLANT
TRANSDUCER W/STOPCOCK (MISCELLANEOUS) ×2 IMPLANT
TUBING CIL FLEX 10 FLL-RA (TUBING) ×2 IMPLANT
WIRE EMERALD 3MM-J .025X260CM (WIRE) ×1 IMPLANT

## 2018-09-11 NOTE — Progress Notes (Signed)
  Echocardiogram 2D Echocardiogram has been performed.  Jennette Dubin 09/11/2018, 3:14 PM

## 2018-09-11 NOTE — Progress Notes (Signed)
Have attempted to have pt sign her consent form but pt is unable to stay awake and sign her name.  Dr Burt Knack coming to assess pt

## 2018-09-11 NOTE — Progress Notes (Signed)
Pt woke and answered questions for Dr Burt Knack.  Pt had earlier denied taking any meds however now states she too a trazodone before she went to sleep.  States she went to sleep at 5am

## 2018-09-11 NOTE — Progress Notes (Signed)
Pt alert and states "Im awake now". Stable without complications. Discharghed home without complications

## 2018-09-11 NOTE — Discharge Instructions (Signed)
Radial Site Care ° °This sheet gives you information about how to care for yourself after your procedure. Your health care provider may also give you more specific instructions. If you have problems or questions, contact your health care provider. °What can I expect after the procedure? °After the procedure, it is common to have: °· Bruising and tenderness at the catheter insertion area. °Follow these instructions at home: °Medicines °· Take over-the-counter and prescription medicines only as told by your health care provider. °Insertion site care °· Follow instructions from your health care provider about how to take care of your insertion site. Make sure you: °? Wash your hands with soap and water before you change your bandage (dressing). If soap and water are not available, use hand sanitizer. °? Change your dressing as told by your health care provider. °? Leave stitches (sutures), skin glue, or adhesive strips in place. These skin closures may need to stay in place for 2 weeks or longer. If adhesive strip edges start to loosen and curl up, you may trim the loose edges. Do not remove adhesive strips completely unless your health care provider tells you to do that. °· Check your insertion site every day for signs of infection. Check for: °? Redness, swelling, or pain. °? Fluid or blood. °? Pus or a bad smell. °? Warmth. °· Do not take baths, swim, or use a hot tub until your health care provider approves. °· You may shower 24-48 hours after the procedure, or as directed by your health care provider. °? Remove the dressing and gently wash the site with plain soap and water. °? Pat the area dry with a clean towel. °? Do not rub the site. That could cause bleeding. °· Do not apply powder or lotion to the site. °Activity ° °· For 24 hours after the procedure, or as directed by your health care provider: °? Do not flex or bend the affected arm. °? Do not push or pull heavy objects with the affected arm. °? Do not  drive yourself home from the hospital or clinic. You may drive 24 hours after the procedure unless your health care provider tells you not to. °? Do not operate machinery or power tools. °· Do not lift anything that is heavier than 10 lb (4.5 kg), or the limit that you are told, until your health care provider says that it is safe. °· Ask your health care provider when it is okay to: °? Return to work or school. °? Resume usual physical activities or sports. °? Resume sexual activity. °General instructions °· If the catheter site starts to bleed, raise your arm and put firm pressure on the site. If the bleeding does not stop, get help right away. This is a medical emergency. °· If you went home on the same day as your procedure, a responsible adult should be with you for the first 24 hours after you arrive home. °· Keep all follow-up visits as told by your health care provider. This is important. °Contact a health care provider if: °· You have a fever. °· You have redness, swelling, or yellow drainage around your insertion site. °Get help right away if: °· You have unusual pain at the radial site. °· The catheter insertion area swells very fast. °· The insertion area is bleeding, and the bleeding does not stop when you hold steady pressure on the area. °· Your arm or hand becomes pale, cool, tingly, or numb. °These symptoms may represent a serious problem   that is an emergency. Do not wait to see if the symptoms will go away. Get medical help right away. Call your local emergency services (911 in the [place] Do not drive yourself to the hospital. °Summary °· After the procedure, it is common to have bruising and tenderness at the site. °· Follow instructions from your health care provider about how to take care of your radial site wound. Check the wound every day for signs of infection. °· Do not lift anything that is heavier than 10 lb (4.5 kg), or the limit that you are told, until your health care provider says  that it is safe. °This information is not intended to replace advice given to you by your health care provider. Make sure you discuss any questions you have with your health care provider. °Document Released: 02/18/2010 Document Revised: 02/21/2017 Document Reviewed: 02/21/2017 °Elsevier Patient Education © 2020 Elsevier Inc[employer] ° °

## 2018-09-11 NOTE — Progress Notes (Signed)
Pt arrived to unit slumped over in her wheelchair asleep. Caregiver reports that pt is not herself today and that she cannot stay awake to finish a sentence.  States pt is always up, dressed and ready when she gets there but not this morning.  She reports that pt is always slow but way more today than usual.  Pt has stable VS.  CBG is 103.  Able to wake pt but she cannot stay awake long enough to finish a sentence.   Pt is able to say her name and where she is.  Concerned for pt's ability to sit in the chair because she is asleep and sliding out.  Pt put on a stretcher and Lindsey,PA and Dr Burt Knack notified.

## 2018-09-11 NOTE — Progress Notes (Signed)
    Called to see patient while in short stay. Concerns over patient being lethargic, possibly confused? In talking with patient she reports staying up all night watching TV. She is alert and oriented to person, place, date and able to state what procedure is planned for today. Care giver at the bedside states the patient was able to shower, dress herself and walk out to the car this morning. Appears to be in her usual state other than being "sleepy". No neuro deficits noted on exam. Will update cath MD on patient status.  Signed, Reino Bellis, NP-C 09/11/2018, 10:31 AM

## 2018-09-11 NOTE — Interval H&P Note (Signed)
History and Physical Interval Note:  09/11/2018 11:52 AM  Katherine [name]  has presented today for surgery, with the diagnosis of as.  The various methods of treatment have been discussed with the patient and family. After consideration of risks, benefits and other options for treatment, the patient has consented to  Procedure(s): RIGHT/LEFT HEART CATH AND CORONARY ANGIOGRAPHY (N/A) as a surgical intervention.  [name] history has been reviewed, patient examined, no change in status, stable for surgery.  I have reviewed the patient's chart and labs.  Questions were answered to the patient's satisfaction.    Pt seen and evaluated. Reportedly very sleepy. At the time of my assessment, she is awake and alert. Her mouth is very dry, but she is responding normally to questions and clearly understands that she is here for cardiac catheterization in order to prepare her for valve surgery. She has no focal neuro deficits and is stable to proceed as planned with R/L heart catheterization.  Sherren Mocha

## 2018-09-12 ENCOUNTER — Encounter (HOSPITAL_COMMUNITY): Payer: Self-pay | Admitting: Cardiovascular Disease

## 2018-09-18 ENCOUNTER — Telehealth: Payer: Self-pay

## 2018-09-18 NOTE — Telephone Encounter (Signed)
  [facility] VALVE TEAM  ADDENDUM 09-18-2018: [name] case is reviewed with the multidisciplinary heart team after she underwent repeat echo and right and left heart catheterization.  The patient has findings consistent with moderate aortic stenosis and two-vessel obstructive coronary artery disease.  She has no anginal symptoms and has preserved LV function.  After review of her case, we have elected to follow her clinically.  I would like to see her back in the office in 6 months with a repeat echocardiogram.  Plan discussed with her primary cardiologist, Roque Cash, PA.   Sherren Mocha 09/18/2018 2:00 PM   I contacted the pt's caregiver Letta Median and made her aware of recommendations from Structural Heart Team.  TAVR CT scans and appointment with Dr Roxy Manns have been cancelled at this time. We will plan to have the pt see Dr Burt Knack in 6 months with an Echocardiogram same day.  The pt will continue routine follow-up with Roque Cash PA. Letta Median verbalized understanding of plan.

## 2018-09-20 ENCOUNTER — Ambulatory Visit (HOSPITAL_COMMUNITY): Payer: Medicare Other

## 2018-09-24 ENCOUNTER — Encounter: Payer: Medicare Other | Admitting: Thoracic Surgery (Cardiothoracic Vascular Surgery)

## 2018-09-27 ENCOUNTER — Ambulatory Visit: Payer: Medicare Other | Admitting: Physical Therapy

## 2018-09-30 ENCOUNTER — Telehealth: Payer: Self-pay | Admitting: Cardiovascular Disease

## 2018-09-30 NOTE — Telephone Encounter (Signed)
New message:     Patient calling stating that some one called about a appt.

## 2018-10-01 NOTE — Telephone Encounter (Signed)
The pt's caregiver Katherine [name] called to make me aware that the pt is confused about why she did not come in for TAVR appointments.  The pt is also saying that Dr Burt Knack left a tube in her arm from cardiac catheterization. Katherine [name] placed me on speaker phone so that the pt could hear our conversation.  I reviewed with the pt that TAVR testing was cancelled and at this time we will continue with observation of AS. We will contact Katherine [name] to schedule Echo and office visit when she is due. I also made the pt aware that the catheter for cardiac catheterization was removed from her arm.  The pt states understanding of the current plan.

## 2018-11-13 ENCOUNTER — Telehealth: Payer: Self-pay | Admitting: Cardiovascular Disease

## 2018-11-13 NOTE — Telephone Encounter (Signed)
   [facility] Pre-operative Risk Assessment    Request for surgical clearance:  1. What type of surgery is being performed? FILLINGS, 1 EXTRACTION, DEEP CLEANING   2. When is this surgery scheduled? 11/18/18   3. What type of clearance is required (medical clearance vs. Pharmacy clearance to hold med vs. Both)? MEDICAL  4. Are there any medications that need to be held prior to surgery and how long? ASA    5. Practice name and name of physician performing surgery? [facility]   6. What is your office phone number [phone]    7.   What is your office fax number [phone]  8.   Anesthesia type (None, local, MAC, general) ? LOCAL   Julaine Hua 11/13/2018, 12:14 PM  _________________________________________________________________   (provider comments below)

## 2018-11-13 NOTE — Telephone Encounter (Signed)
New message     Calling to see if pt had "heart surgery".  They sent a surgical clearance to our office and have not heard back.

## 2018-11-13 NOTE — Telephone Encounter (Signed)
Please inform requesting provider to forward clearance to Roque Cash PA-C and Dr. Claudie Leach who are her primary cardiologist at [facility]. Although Dr. Burt Knack evaluated her for possible TAVR and CAD, however the current plan is to continue observation.

## 2018-11-14 NOTE — Telephone Encounter (Signed)
   Primary Cardiologist: No primary care provider on file.  Although Dr. Burt Knack has evaluated her for possible TAVR, her cardiologist team is Dr. Claudie Leach and Roque Cash, PA-C.  Please contact them for cardiac clearance for her fillings, extraction, and deep cleaning.  I will route this recommendation to the requesting party via Epic fax function and remove from pre-op pool.  Please call with questions.  Rosaria Ferries, PA-C 11/14/2018, 9:33 AM  5. [facility]   6. What is your office phone number [phone]    7.   What is your office fax number [phone].

## 2019-02-20 ENCOUNTER — Telehealth: Payer: Self-pay

## 2019-02-20 DIAGNOSIS — I350 Nonrheumatic aortic (valve) stenosis: Secondary | ICD-10-CM

## 2019-02-20 NOTE — Telephone Encounter (Signed)
Spoke with Lucendia Herrlich, caregiver. Scheduled patient for 6 month echo and office visit with Dr. Excell Seltzer 2/12. She was grateful for assistance.

## 2019-02-27 ENCOUNTER — Other Ambulatory Visit: Payer: Self-pay | Admitting: Cardiology

## 2019-02-27 DIAGNOSIS — Z1231 Encounter for screening mammogram for malignant neoplasm of breast: Secondary | ICD-10-CM

## 2019-03-14 ENCOUNTER — Encounter: Payer: Self-pay | Admitting: Cardiovascular Disease

## 2019-03-14 ENCOUNTER — Ambulatory Visit (HOSPITAL_COMMUNITY): Payer: Medicare Other | Attending: Internal Medicine

## 2019-03-14 ENCOUNTER — Ambulatory Visit: Payer: Medicare Other | Admitting: Cardiovascular Disease

## 2019-03-14 ENCOUNTER — Ambulatory Visit (INDEPENDENT_AMBULATORY_CARE_PROVIDER_SITE_OTHER): Payer: Medicare Other | Admitting: Cardiovascular Disease

## 2019-03-14 ENCOUNTER — Other Ambulatory Visit: Payer: Self-pay

## 2019-03-14 VITALS — BP 130/84 | HR 64 | Ht 58.5 in | Wt 145.2 lb

## 2019-03-14 DIAGNOSIS — I350 Nonrheumatic aortic (valve) stenosis: Secondary | ICD-10-CM | POA: Diagnosis present

## 2019-03-14 DIAGNOSIS — I25119 Atherosclerotic heart disease of native coronary artery with unspecified angina pectoris: Secondary | ICD-10-CM | POA: Diagnosis not present

## 2019-03-14 LAB — BASIC METABOLIC PANEL
BUN/Creatinine Ratio: 21 (ref 12–28)
BUN: 13 mg/dL (ref 8–27)
CO2: 23 mmol/L (ref 20–29)
Calcium: 9.5 mg/dL (ref 8.7–10.3)
Chloride: 99 mmol/L (ref 96–106)
Creatinine, Ser: 0.63 mg/dL (ref 0.57–1.00)
GFR calc Af Amer: 103 mL/min/{1.73_m2} (ref >59)
GFR calc non Af Amer: 89 mL/min/{1.73_m2} (ref >59)
Glucose: 84 mg/dL (ref 65–99)
Potassium: 4.8 mmol/L (ref 3.5–5.2)
Sodium: 137 mmol/L (ref 134–144)

## 2019-03-14 NOTE — Addendum Note (Signed)
Addended by: Gunnar Fusi A on: 03/14/2019 09:29 AM   Modules accepted: Orders

## 2019-03-14 NOTE — Progress Notes (Signed)
Cardiology Office Note:    Date:  03/14/2019   ID:  Katherine [name], DOB 11-23-1945, MRN [id number]  PCP:  Coralee Rud, PA-C  Cardiologist:  No primary care provider on file.  Electrophysiologist:  None   Referring MD: Coralee Rud, PA-C   Chief Complaint  Patient presents with  . Shortness of Breath    History of Present Illness:    Katherine [name] is a 74 y.o. female with a hx of aortic stenosis and coronary artery disease, presenting for follow-up evaluation.  The patient is here with her friend today.  Her friend, Katherine [name], takes her to all of her medical appointments.  I saw her last in July 2020 when she was referred for evaluation of aortic stenosis.  At that time she underwent right and left heart catheterization and echo testing.  Her study suggested moderate aortic stenosis.  She was diagnosed with significant coronary artery disease involving the RCA, left circumflex, and mild diffuse nonobstructive disease of the LAD.  Medical therapy was recommended with close outpatient follow-up.  She returns today after undergoing an echocardiogram for further surveillance of her aortic stenosis.  Patient complains of intermittent chest discomfort.  This can occur with activity or at rest.  She describes a central pain.  She has not taken nitroglycerin.  Symptoms have been self-limited.  She also complains of shortness of breath and fatigue.  She denies orthopnea or PND.  She states that she sleeps well.  She has had no leg swelling, lightheadedness, or syncope.  The patient ambulates with a cane or a walker and she has not had any recent falls.  Past Medical History:  Diagnosis Date  . ANXIETY 07/31/2007  . DEPRESSION 09/05/2006  . DIABETES MELLITUS, TYPE II   . Diabetic foot ulcer (HCC) 10/26/2014  . DIVERTICULOSIS, COLON 09/05/2006  . FIBROMYALGIA 05/10/2009  . GERD 09/05/2006  . HYPERLIPIDEMIA 09/05/2006  . HYPERTENSION 09/05/2006  . MRSA infection 10/26/2014  . OSTEOARTHRITIS 09/05/2006   . OSTEOPOROSIS 07/31/2007  . Other specified forms of hearing loss 11/01/2009  . Severe aortic stenosis    followed at [facility]; 06/14/16 EF 55-60%, mod-severe AS, peak/mean grad 26.46 mmHg/ 16.31 mmHg, AVA by continuity eq 0.90 cm2  . Unspecified hearing loss 10/09/2007    Past Surgical History:  Procedure Laterality Date  . ABDOMINAL HYSTERECTOMY  12/08   Bladder tac, partial hysterectomy  . BREAST BIOPSY    . catarct removal     both eyes  . COLONOSCOPY    . I & D KNEE WITH POLY EXCHANGE Left 01/03/2013   Procedure: IRRIGATION AND DEBRIDEMENT LEFT KNEE WITH POLY EXCHANGE;  Surgeon: Kathryne Hitch, MD;  Location: [facility] ORS;  Service: Orthopedics;  Laterality: Left;  . inguinal herniorrhapy     right Dr. Freida Busman 06/2009  . REVERSE SHOULDER ARTHROPLASTY Right 09/07/2016  . REVERSE SHOULDER ARTHROPLASTY Right 09/07/2016   Procedure: REVERSE SHOULDER ARTHROPLASTY;  Surgeon: Jones Broom, MD;  Location: [facility] OR;  Service: Orthopedics;  Laterality: Right;  Right reverse shoulder arthroplasty  . RIGHT/LEFT HEART CATH AND CORONARY ANGIOGRAPHY N/A 09/11/2018   Procedure: RIGHT/LEFT HEART CATH AND CORONARY ANGIOGRAPHY;  Surgeon: [name], MD;  Location: [facility] INVASIVE CV LAB;  Service: Cardiovascular;  Laterality: N/A;  . TONSILLECTOMY    . TOTAL KNEE ARTHROPLASTY Left 12/20/2012   Procedure: LEFT TOTAL KNEE ARTHROPLASTY;  Surgeon: Kathryne Hitch, MD;  Location: [facility] ORS;  Service: Orthopedics;  Laterality: Left;    Current Medications:  Current Meds  Medication Sig  . albuterol (VENTOLIN HFA) 108 (90 Base) MCG/ACT inhaler Inhale 1 puff into the lungs 2 (two) times daily as needed for wheezing or shortness of breath.   [name] aspirin EC 81 MG tablet Take 81 mg by mouth daily.   . benztropine (COGENTIN) 0.5 MG tablet Take 0.5 mg by mouth 2 (two) times daily.    . calcium-vitamin D (OSCAL WITH D) 500-200 MG-UNIT tablet Take 1 tablet by mouth 3 (three) times daily. (Patient  taking differently: Take 1 tablet by mouth 2 (two) times daily. )  . cetirizine (ZYRTEC) 10 MG tablet TAKE 1 TABLET BY MOUTH DAILY  . clindamycin (CLEOCIN) 300 MG capsule Take 600 mg by mouth See admin instructions. 1 HOUR PRIOR TO DENTAL APPOINTMENTS  . diclofenac sodium (VOLTAREN) 1 % GEL Apply 2-4 g topically 4 (four) times daily as needed for pain.  . fluticasone (FLONASE) 50 MCG/ACT nasal spray Place 1 spray into both nostrils daily as needed for allergies.   [name] gabapentin (NEURONTIN) 100 MG capsule Take 100 mg by mouth 3 (three) times daily.   . hydrOXYzine (ATARAX/VISTARIL) 10 MG tablet Take 1 tablet (10 mg total) by mouth 2 (two) times daily as needed.  [name] ipratropium (ATROVENT) 0.06 % nasal spray USE 1 SPRAY IN EACH NOSTRIL THREE TIMES DAILY  . metFORMIN (GLUCOPHAGE) 500 MG tablet Take 500 mg by mouth 2 (two) times daily.   . Multiple Vitamin (MULTIVITAMIN WITH MINERALS) TABS tablet Take 1 tablet by mouth daily.  Letta Pate DELICA LANCETS 33G MISC CHECK BLOOD SUGAR ONCE DAILY AS DIRECTED  . pantoprazole (PROTONIX) 40 MG tablet TAKE 1 TABLET BY MOUTH DAILY (Patient taking differently: Take 40 mg by mouth 2 (two) times daily. )  . Potassium Chloride ER 20 MEQ TBCR Take 20 mEq by mouth 2 (two) times daily.   . pravastatin (PRAVACHOL) 20 MG tablet Take 20 mg by mouth daily.  . risperiDONE (RISPERDAL) 3 MG tablet Take 3 mg by mouth at bedtime.   . traMADol (ULTRAM) 50 MG tablet Take 50 mg by mouth 3 (three) times daily as needed (for pain.).   [name] UREA 20 INTENSIVE HYDRATING 20 % cream Apply 1 application topically daily as needed. For dry skin     Allergies:   Penicillins, Pollen extract, Coconut oil, Mellaril [thioridazine], and Thioridazine hcl   Social History   Socioeconomic History  . Marital status: Single    Spouse name: Not on file  . Number of children: Not on file  . Years of education: Not on file  . Highest education level: Not on file  Occupational History  . Occupation:  retired [employer]    Employer: DISABLED  Tobacco Use  . Smoking status: Never Smoker  . Smokeless tobacco: Never Used  Substance and Sexual Activity  . Alcohol use: No    Alcohol/week: 0.0 standard drinks  . Drug use: No  . Sexual activity: Not on file  Other Topics Concern  . Not on file  Social History Narrative   LIves alone with home health services.   Social Determinants of Health   Financial Resource Strain:   . Difficulty of Paying Living Expenses: Not on file  Food Insecurity:   . Worried About Programme researcher, broadcasting/film/video in the Last Year: Not on file  . Ran Out of Food in the Last Year: Not on file  Transportation Needs:   . Lack of Transportation (Medical): Not on file  . Lack of  Transportation (Non-Medical): Not on file  Physical Activity:   . Days of Exercise per Week: Not on file  . Minutes of Exercise per Session: Not on file  Stress:   . Feeling of Stress : Not on file  Social Connections:   . Frequency of Communication with Friends and Family: Not on file  . Frequency of Social Gatherings with Friends and Family: Not on file  . Attends Religious Services: Not on file  . Active Member of Clubs or Organizations: Not on file  . Attends Banker Meetings: Not on file  . Marital Status: Not on file     Family History: [name] family history includes Coronary artery disease in an other family member; Diabetes in an other family member. There is no history of Colon cancer, Esophageal cancer, Stomach cancer, or Rectal cancer.  ROS:   Please see the history of present illness.    All other systems reviewed and are negative.  EKGs/Labs/Other Studies Reviewed:    The following studies were reviewed today: Cardiac Cath 09-11-2018: Conclusion    Prox RCA to Mid RCA lesion is 80% stenosed.  Prox Cx lesion is 75% stenosed.  Mid Cx to Dist Cx lesion is 50% stenosed.  Mid LAD lesion is 40% stenosed.  There is moderate aortic valve  stenosis.   1. Severe 2 vessel CAD with severe stenosis of the proximal circumflex and mid-RCA 2. Moderate AS by direct pressure measurement, mean transvalvular gradient 25 mmHg and calculated AVA 1.22 square cm  Plan: Check 2D echo, review case with multidisciplinary heart valve team. If severe AS by echo, likely will need to proceed with 2 vessel PCI with atherectomy prior to TAVR.    Coronary Findings  Diagnostic Dominance: Right Left Anterior Descending  Mid LAD lesion 40% stenosed  Mid LAD lesion is 40% stenosed. The lesion is moderately calcified.  Left Circumflex  Prox Cx lesion 75% stenosed  Prox Cx lesion is 75% stenosed. The lesion is severely calcified.  Mid Cx to Dist Cx lesion 50% stenosed  Mid Cx to Dist Cx lesion is 50% stenosed.  Right Coronary Artery  Prox RCA to Mid RCA lesion 80% stenosed  Prox RCA to Mid RCA lesion is 80% stenosed. The lesion is severely calcified.  Intervention  No interventions have been documented. Left Heart  Aortic Valve There is moderate aortic valve stenosis. The aortic valve is calcified. There is restricted aortic valve motion. Mean gradient 25 mmHg, Area 1.22 square cm  Coronary Diagrams  Diagnostic Dominance: Right    EKG:  EKG is ordered today.  The ekg ordered today demonstrates normal sinus rhythm 64 bpm, left axis deviation, otherwise within normal limits.  Recent Labs: 08/19/2018: BUN 14; Creatinine, Ser 0.54; Platelets 293 09/11/2018: Hemoglobin 12.9; Potassium 5.0; Sodium 140  Recent Lipid Panel    Component Value Date/Time   CHOL 140 06/15/2015 1349   TRIG 148.0 06/15/2015 1349   TRIG 104 01/24/2006 0956   HDL 39.00 (L) 06/15/2015 1349   CHOLHDL 4 06/15/2015 1349   VLDL 29.6 06/15/2015 1349   LDLCALC 72 06/15/2015 1349   LDLDIRECT 66.0 06/16/2014 1437    Physical Exam:    VS:  BP 130/84   Pulse 64   Ht 4' 10.5" (1.486 m)   Wt 145 lb 3.2 oz (65.9 kg)   SpO2 97%   BMI 29.83 kg/m     Wt Readings from  Last 3 Encounters:  03/14/19 145 lb 3.2 oz (65.9 kg)  09/11/18 144 lb (65.3 kg)  08/19/18 149 lb 1.9 oz (67.6 kg)     GEN: Well nourished, well developed in no acute distress HEENT: Normal NECK: No JVD; bilateral carotid bruits LYMPHATICS: No lymphadenopathy CARDIAC: RRR, 3/6 harsh systolic murmur at the right upper sternal border, no diastolic murmur RESPIRATORY:  Clear to auscultation without rales, wheezing or rhonchi  ABDOMEN: Soft, non-tender, non-distended MUSCULOSKELETAL:  No edema; No deformity  SKIN: Warm and dry NEUROLOGIC:  Alert and oriented x 3 PSYCHIATRIC:  Normal affect   ASSESSMENT:    1. Severe aortic stenosis   2. Coronary artery disease involving native coronary artery of native heart with angina pectoris ([place])    PLAN:    In order of problems listed above:  1. I have reviewed the natural history of aortic stenosis with the patient and her close friend who is present today. We have discussed the limitations of medical therapy and the poor prognosis associated with symptomatic aortic stenosis. We have reviewed potential treatment options, including palliative medical therapy, conventional surgical aortic valve replacement, and transcatheter aortic valve replacement. We discussed treatment options in the context of the patient's specific comorbid medical conditions.  I have personally reviewed the patient's echo images from this morning study.  The formal interpretation is currently pending.  [name] LV function appears normal with an LVEF of about 65%.  Her aortic valve is severely calcified and restricted.  There is partial fusion of the right and left cusps with some degree of mobility of the noncoronary cusp.  Doppler data shows a peak systolic velocity of 1.02 m/s.  Peak and mean gradients are 64 and 38 mmHg, respectively, the aortic valve dimensionless index is 0.2.  These findings are consistent with severe, stage D1 aortic stenosis.  I have recommended  continued evaluation for aortic valve replacement.  I suspect the patient will be better suited for transcatheter valve replacement considering her comorbidities and poor functional capacity.  I have also reviewed her coronary angiogram.  She has severe stenosis of the RCA, moderately severe stenosis of the circumflex, and mild nonobstructive disease of the LAD.  I think she should undergo PCI of the right coronary artery and possibly PCI of the left circumflex.  This will require orbital atherectomy and stenting.  I recommended the patient undergo multidisciplinary heart team evaluation for her aortic stenosis and coronary artery disease.  She will undergo a CT angiogram of the heart followed by a CT angiogram of the chest, abdomen, and pelvis.  Once her CT studies are completed, I would like her to undergo formal cardiac surgical consultation as part of a multidisciplinary approach to her care.  All of this is discussed at length with the patient and her friend today.  We discussed the TAVR procedure with potential complications as well as expected postoperative recovery.  She understands that she will have to undergo further testing to make sure that she is an appropriate candidate.   Medication Adjustments/Labs and Tests Ordered: Current medicines are reviewed at length with the patient today.  Concerns regarding medicines are outlined above.  Orders Placed This Encounter  Procedures  . CT ANGIO ABDOMEN PELVIS  W &/OR WO CONTRAST  . CT CORONARY MORPH W/CTA COR W/SCORE W/CA W/CM &/OR WO/CM  . CT ANGIO CHEST AORTA W/CM & OR WO/CM  . Basic metabolic panel  . EKG 12-Lead   No orders of the defined types were placed in this encounter.   Patient Instructions  Medication Instructions:  Your provider recommends that you continue on your current medications as directed. Please refer to the Current Medication list given to you today.    Labwork: TODAY! BMET  Testing/Procedures: We will call you to  arrange your testing.  Follow-Up: You will be called to arrange follow-up.    Signed, [name], MD  03/14/2019 2:41 PM     [facility]

## 2019-03-14 NOTE — Patient Instructions (Signed)
Medication Instructions:  Your provider recommends that you continue on your current medications as directed. Please refer to the Current Medication list given to you today.    Labwork: TODAY! BMET  Testing/Procedures: We will call you to arrange your testing.  Follow-Up: You will be called to arrange follow-up.

## 2019-03-18 ENCOUNTER — Telehealth: Payer: Self-pay | Admitting: Cardiovascular Disease

## 2019-03-18 NOTE — Telephone Encounter (Signed)
The patient called as a reminder to call Lucendia Herrlich to arrange appointments. She was grateful for call back.

## 2019-03-18 NOTE — Telephone Encounter (Signed)
New message:    Patient would like for some one to call her. Patient has some question.

## 2019-04-03 ENCOUNTER — Encounter (HOSPITAL_COMMUNITY): Payer: Self-pay

## 2019-04-03 ENCOUNTER — Other Ambulatory Visit: Payer: Self-pay

## 2019-04-03 ENCOUNTER — Ambulatory Visit (HOSPITAL_COMMUNITY)
Admission: RE | Admit: 2019-04-03 | Discharge: 2019-04-03 | Disposition: A | Discharge status: Home / Self Care | Payer: Medicare Other | Source: Ambulatory Visit | Attending: Cardiovascular Disease | Admitting: Cardiovascular Disease

## 2019-04-03 ENCOUNTER — Encounter: Payer: Self-pay | Admitting: Physician Assistant

## 2019-04-03 DIAGNOSIS — I350 Nonrheumatic aortic (valve) stenosis: Secondary | ICD-10-CM

## 2019-04-03 MED ORDER — IOHEXOL 350 MG/ML SOLN
100.0000 mL | Freq: Once | INTRAVENOUS | Status: AC | PRN
  Administered 2019-04-03: 100 mL via INTRAVENOUS

## 2019-04-07 ENCOUNTER — Encounter: Payer: Self-pay | Admitting: Thoracic Surgery (Cardiothoracic Vascular Surgery)

## 2019-04-07 ENCOUNTER — Other Ambulatory Visit: Payer: Self-pay

## 2019-04-07 ENCOUNTER — Institutional Professional Consult (permissible substitution) (INDEPENDENT_AMBULATORY_CARE_PROVIDER_SITE_OTHER): Payer: Medicare Other | Admitting: Thoracic Surgery (Cardiothoracic Vascular Surgery)

## 2019-04-07 VITALS — BP 155/93 | HR 60 | Temp 97.5°F | Resp 16 | Ht 58.5 in | Wt 141.0 lb

## 2019-04-07 DIAGNOSIS — I350 Nonrheumatic aortic (valve) stenosis: Secondary | ICD-10-CM

## 2019-04-07 DIAGNOSIS — I25119 Atherosclerotic heart disease of native coronary artery with unspecified angina pectoris: Secondary | ICD-10-CM | POA: Diagnosis not present

## 2019-04-07 NOTE — Progress Notes (Signed)
[facility]  MULTIDISCIPLINARY HEART VALVE CLINIC  CARDIOTHORACIC SURGERY CONSULTATION REPORT  Referring Provider is Cooper, Michael, MD Primary Cardiologist is Rosario, Raymond T, MD PCP is Duran, Michael R, PA-C  Chief Complaint  Patient presents with  . Aortic Stenosis    Surgical eval for TAVR, review all studies/testing...? needs PT EVAL    HPI:  Patient is 73-year-old female with history of aortic stenosis, coronary artery disease, hypertension, hyperlipidemia, type 2 diabetes mellitus, arthritis, fibromyalgia, anxiety, and dementia who has been referred for surgical consultation to discuss treatment options for management of severe symptomatic aortic stenosis and multivessel coronary artery disease.  Patient states she has known of presence of a heart murmur for many years.  She has been followed by Michael Duran for many years with multiple medical problems.  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  Echocardiograms have documented the presence of aortic stenosis that has gradually progressed in severity.  She was originally referred to the multidisciplinary heart valve clinic last summer at which time she was evaluated by Dr. Cooper.  Echocardiogram performed at that time revealed moderate aortic stenosis with normal left ventricular systolic function.  Diagnostic cardiac catheterization revealed multivessel coronary artery disease with long segment high-grade stenosis of the right coronary artery and 75% stenosis in the left circumflex.  There was nonobstructive disease in the left anterior descending coronary artery.  Mean transvalvular gradient across the aortic valve was measured 25 mmHg at that time, corresponding to aortic valve area calculated 1.22 cm.  Medical therapy was recommended.  The patient was recently seen in follow-up by Dr. Cooper.  She describes increased fatigue with symptoms of exertional shortness of breath and  chest discomfort.  Follow-up transthoracic echocardiogram revealed significant progression and severity of aortic stenosis.  Left ventricular systolic function remain normal.  CT angiography was performed and surgical consultation requested.  Patient is single and never been married.  She has no children.  She lives in an apartment by herself.  She has limited cognitive ability and physical mobility.  She is accompanied by a caregiver (Faye Harley) who stops by regularly to assist the patient with daily needs and provide transportation for purchasing groceries and taking her for physician appointments.  She has been followed by psychiatrist for many years for "nerves".  The patient has reportedly been diagnosed with some type of dementia, although details remain unclear.  By report the patient's functional status remains limited but quite stable.  She ambulates using a cane.  She has had some mechanical falls in the past because of poor balance.  There is no reported history of stroke.  Patient does admit to symptoms of exertional shortness of breath and chest discomfort.  She denies nocturnal symptoms.  She lives a sedentary lifestyle.  She reports occasional dizzy spells without syncope.  She denies history of PND, orthopnea, or lower extremity edema.  Past Medical History:  Diagnosis Date  . ANXIETY 07/31/2007  . CAD (coronary artery disease)   . Coronary artery disease involving native coronary artery of native heart with angina pectoris (HCC)   . DEPRESSION 09/05/2006  . DIABETES MELLITUS, TYPE II   . Diabetic foot ulcer (HCC) 10/26/2014  . DIVERTICULOSIS, COLON 09/05/2006  . FIBROMYALGIA 05/10/2009  . GERD 09/05/2006  . HYPERLIPIDEMIA 09/05/2006  . HYPERTENSION 09/05/2006  . MRSA infection 10/26/2014  . OSTEOARTHRITIS 09/05/2006  . OSTEOPOROSIS 07/31/2007  . Severe aortic stenosis    followed at [facility]; 06/14/16   EF 55-60%, mod-severe AS, peak/mean grad 26.46 mmHg/ 16.31 mmHg, AVA by continuity  eq 0.90 cm2  . Unspecified hearing loss 10/09/2007    Past Surgical History:  Procedure Laterality Date  . ABDOMINAL HYSTERECTOMY  12/08   Bladder tac, partial hysterectomy  . BREAST BIOPSY    . catarct removal     both eyes  . COLONOSCOPY    . I & D KNEE WITH POLY EXCHANGE Left 01/03/2013   Procedure: IRRIGATION AND DEBRIDEMENT LEFT KNEE WITH POLY EXCHANGE;  Surgeon: Christopher Y Blackman, MD;  Location: [facility];  Service: Orthopedics;  Laterality: Left;  . inguinal herniorrhapy     right Dr. Allen 06/2009  . REVERSE SHOULDER ARTHROPLASTY Right 09/07/2016  . REVERSE SHOULDER ARTHROPLASTY Right 09/07/2016   Procedure: REVERSE SHOULDER ARTHROPLASTY;  Surgeon: Chandler, Justin, MD;  Location: [facility];  Service: Orthopedics;  Laterality: Right;  Right reverse shoulder arthroplasty  . RIGHT/LEFT HEART CATH AND CORONARY ANGIOGRAPHY N/A 09/11/2018   Procedure: RIGHT/LEFT HEART CATH AND CORONARY ANGIOGRAPHY;  Surgeon: Cooper, Michael, MD;  Location: [facility] IN[facility]AS[facility];  Service: Cardiovascular;  Laterality: N/A;  . TONSILLECTOMY    . TOTAL KNEE ARTHROPLASTY Left 12/20/2012   Procedure: LEFT TOTAL KNEE ARTHROPLASTY;  Surgeon: Christopher Y Blackman, MD;  Location: [facility];  Service: Orthopedics;  Laterality: Left;    Family History  Problem Relation Age of Onset  . Coronary artery disease Other   . Diabetes Other   . Colon cancer Neg Hx   . Esophageal cancer Neg Hx   . Stomach cancer Neg Hx   . Rectal cancer Neg Hx     Social History   Socioeconomic History  . Marital status: Single    Spouse name: Not on file  . Number of children: Not on file  . Years of education: Not on file  . Highest education level: Not on file  Occupational History  . Occupation: retired [employer]    Employer: DISABLED  Tobacco Use  . Smoking status: Never Smoker  . Smokeless tobacco: Never Used  Substance and Sexual Activity  . Alcohol use: No    Alcohol/week: 0.0 standard drinks  . Drug use: No   . Sexual activity: Not on file  Other Topics Concern  . Not on file  Social History Narrative   LIves alone with home health services.   Social Determinants of Health   Financial Resource Strain:   . Difficulty of Paying Living Expenses: Not on file  Food Insecurity:   . Worried About Running Out of Food in the Last Year: Not on file  . Ran Out of Food in the Last Year: Not on file  Transportation Needs:   . Lack of Transportation (Medical): Not on file  . Lack of Transportation (Non-Medical): Not on file  Physical Activity:   . Days of Exercise per Week: Not on file  . Minutes of Exercise per Session: Not on file  Stress:   . Feeling of Stress : Not on file  Social Connections:   . Frequency of Communication with Friends and Family: Not on file  . Frequency of Social Gatherings with Friends and Family: Not on file  . Attends Religious Services: Not on file  . Active Member of Clubs or Organizations: Not on file  . Attends Club or Organization Meetings: Not on file  . Marital Status: Not on file  Intimate Partner Violence:   . Fear of Current or Ex-Partner: Not on file  . Emotionally   Abused: Not on file  . Physically Abused: Not on file  . Sexually Abused: Not on file    Current Outpatient Medications  Medication Sig Dispense Refill  . albuterol (VENTOLIN HFA) 108 (90 Base) MCG/ACT inhaler Inhale 1 puff into the lungs 2 (two) times daily as needed for wheezing or shortness of breath.     . aspirin EC 81 MG tablet Take 81 mg by mouth daily.     . benztropine (COGENTIN) 0.5 MG tablet Take 0.5 mg by mouth 2 (two) times daily.      . calcium-vitamin D (OSCAL WITH D) 500-200 MG-UNIT tablet Take 1 tablet by mouth 3 (three) times daily. (Patient taking differently: Take 1 tablet by mouth 2 (two) times daily. ) 270 tablet 3  . cetirizine (ZYRTEC) 10 MG tablet TAKE 1 TABLET BY MOUTH DAILY 30 tablet 0  . clindamycin (CLEOCIN) 300 MG capsule Take 600 mg by mouth See admin  instructions. 1 HOUR PRIOR TO DENTAL APPOINTMENTS    . diclofenac sodium (VOLTAREN) 1 % GEL Apply 2-4 g topically 4 (four) times daily as needed for pain.    . fluticasone (FLONASE) 50 MCG/ACT nasal spray Place 1 spray into both nostrils daily as needed for allergies.     . gabapentin (NEURONTIN) 100 MG capsule Take 100 mg by mouth 3 (three) times daily.     . hydrOXYzine (ATARAX/VISTARIL) 10 MG tablet Take 1 tablet (10 mg total) by mouth 2 (two) times daily as needed. 30 tablet 11  . ipratropium (ATROVENT) 0.06 % nasal spray USE 1 SPRAY IN EACH NOSTRIL THREE TIMES DAILY 30 mL 11  . metFORMIN (GLUCOPHAGE) 500 MG tablet Take 500 mg by mouth 2 (two) times daily.     . Multiple Vitamin (MULTIVITAMIN WITH MINERALS) TABS tablet Take 1 tablet by mouth daily.    . ONETOUCH DELICA LANCETS 33G MISC CHECK BLOOD SUGAR ONCE DAILY AS DIRECTED 100 each 0  . pantoprazole (PROTONIX) 40 MG tablet TAKE 1 TABLET BY MOUTH DAILY (Patient taking differently: Take 40 mg by mouth 2 (two) times daily. ) 90 tablet 0  . Potassium Chloride ER 20 MEQ TBCR Take 20 mEq by mouth 2 (two) times daily.   1  . pravastatin (PRAVACHOL) 20 MG tablet Take 20 mg by mouth daily.  3  . risperiDONE (RISPERDAL) 3 MG tablet Take 3 mg by mouth at bedtime.     . traMADol (ULTRAM) 50 MG tablet Take 50 mg by mouth 3 (three) times daily as needed (for pain.).     . UREA 20 INTENSIVE HYDRATING 20 % cream Apply 1 application topically daily as needed. For dry skin  6   No current facility-administered medications for this visit.    Allergies  Allergen Reactions  . Penicillins Other (See Comments)    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 PATIENT HAS HAD A PCN REACTION WITH IMMEDIATE RASH, FACIAL/TONGUE/THROAT SWELLING, SOB, OR LIGHTHEADEDNESS WITH HYPOTENSION:  #  #  #  YES  #  #  #   Has patient had a PCN reaction causing severe rash involving mucus membranes or skin necrosis: Unknown Has patient had a PCN reaction that required  hospitalization: Unknown Has patient had a PCN reaction occurring within the last 10 years: Unknown   . Pollen Extract     UNSPECIFIED REACTION   . Coconut Oil Itching    UNSPECIFIED REACTION   . Mellaril [Thioridazine] Itching  . Thioridazine Hcl Itching      Review of Systems:     General:  normal appetite, decreased energy, no weight gain, no weight loss, no fever  Cardiac:  + chest pain with exertion, no chest pain at rest, +SOB with exertion, no resting SOB, no PND, no orthopnea, no palpitations, no arrhythmia, no atrial fibrillation, no LE edema, + dizzy spells, no syncope  Respiratory:  + shortness of breath, no home oxygen, no productive cough, no dry cough, no bronchitis, no wheezing, no hemoptysis, no asthma, no pain with inspiration or cough, no sleep apnea, no CPAP at night  GI:   no difficulty swallowing, no reflux, no frequent heartburn, no hiatal hernia, no abdominal pain, no constipation, no diarrhea, no hematochezia, no hematemesis, no melena  GU:   no dysuria,  no frequency, no urinary tract infection, no hematuria, no  kidney stones, no kidney disease  Vascular:  no pain suggestive of claudication, no pain in feet, no leg cramps, no varicose veins, no DVT, no non-healing foot ulcer  Neuro:   no stroke, no TIA's, no seizures, no headaches, no temporary blindness one eye,  no slurred speech, no peripheral neuropathy, no chronic pain, + instability of gait, + memory/cognitive dysfunction  Musculoskeletal: + arthritis, no joint swelling, no myalgias, mild difficulty walking, limited mobility   Skin:   no rash, no itching, no skin infections, no pressure sores or ulcerations  Psych:   + anxiety, + depression, no nervousness, no unusual recent stress  Eyes:   + blurry vision, no floaters, no recent vision changes, does not wear glasses or contacts  ENT:   no hearing loss, no loose or painful teeth, no dentures, last saw dentist 2-3 months ago  Hematologic:  no easy bruising, no  abnormal bleeding, no clotting disorder, no frequent epistaxis  Endocrine:  + diabetes, does check CBG's at home           Physical Exam:   BP (!) 155/93 (BP Location: Left Arm, Patient Position: Sitting, Cuff Size: Normal)   Pulse 60   Temp (!) 97.5 F (36.4 C)   Resp 16   Ht 4' 10.5" (1.486 m)   Wt 141 lb (64 kg)   SpO2 100% Comment: RA  BMI 28.97 kg/m   General:  Elderly and frail-appearing  HEENT:  Unremarkable   Neck:   no JVD, no bruits, no adenopathy   Chest:   clear to auscultation, symmetrical breath sounds, no wheezes, no rhonchi   CV:   RRR, grade III/VI crescendo/decrescendo murmur heard best at RSB,  no diastolic murmur  Abdomen:  soft, non-tender, no masses   Extremities:  warm, well-perfused, pulses diminished but palpable, no LE edema  Rectal/GU  Deferred  Neuro:   Grossly non-focal and symmetrical throughout  Skin:   Clean and dry, no rashes, no breakdown   Diagnostic Tests:  EKG: NSR w/out significant AV conduction delay   ECHOCARDIOGRAM REPORT       Patient Name:  Katherine [name] Date of Exam: 03/14/2019  Medical Rec #: [id number]     Height:    58.5 in  Accession #:  [id number]    Weight:    144.0 lb  Date of Birth: 05/31/1945     BSA:     1.59 m  Patient Age:  73 years     BP:      126/78 mmHg  Patient Gender: F         HR:      66 bpm.  Exam Location: [address]   Procedure: 2D Echo, Cardiac Doppler   and Color Doppler   Indications:  I35.0    History:    Patient has prior history of Echocardiogram examinations,  most         recent 09/11/2018. AS; Risk Factors:Hypertension, Diabetes  and         Dyslipidemia.    Sonographer:  William Edwards RDCS  Referring Phys: [id number] MICHAEL COOPER   IMPRESSIONS    1. The aortic valve is abnormal. Aortic valve regurgitation is trivial .  Severe aortic valve stenosis. Severe aortic valve calcification with    severely reduced cusp separation. LVOT calcifications noted. Aortic valve  mean gradient measures 39.0 mmHg.  Aortic valve Vmax measures 4.03 m/s. Dimensionless index approximately  0.21. AVA approximately 0.67 cm 2.  2. Left ventricular ejection fraction, by estimation, is 65%. The left  ventricle has normal function. The left ventricle has no regional wall  motion abnormalities. There is mildly increased left ventricular  hypertrophy. Left ventricular diastolic  parameters are consistent with Grade II diastolic dysfunction  (pseudonormalization). Elevated left ventricular end-diastolic pressure.  3. Right ventricular systolic function is normal. The right ventricular  size is normal. The estimated right ventricular systolic pressure is 38.3  mmHg.  4. Left atrial size was severely dilated.  5. The mitral valve is abnormal. Trivial mitral valve regurgitation. No  evidence of mitral stenosis.  6. Aortic dilatation noted. There is borderline dilatation of the  ascending aorta measuring 38 mm.  7. The inferior vena cava is normal in size with greater than 50%  respiratory variability, suggesting right atrial pressure of 3 mmHg.   FINDINGS  Left Ventricle: Left ventricular ejection fraction, by estimation, is  65%. The left ventricle has normal function. The left ventricle has no  regional wall motion abnormalities. The left ventricular internal cavity  size was normal in size. There is  mildly increased left ventricular hypertrophy. Left ventricular diastolic  parameters are consistent with Grade II diastolic dysfunction  (pseudonormalization). Elevated left ventricular end-diastolic pressure.   Right Ventricle: The right ventricular size is normal. No increase in  right ventricular wall thickness. Right ventricular systolic function is  normal. The tricuspid regurgitant velocity is 2.97 m/s, and with an  assumed right atrial pressure of 3 mmHg,  the estimated right  ventricular systolic pressure is 38.3 mmHg.   Left Atrium: Left atrial size was severely dilated.   Right Atrium: Right atrial size was normal in size.   Pericardium: There is no evidence of pericardial effusion.   Mitral Valve: Severe LVOT calcification along aorto-mitral continutiy. The  mitral valve is abnormal. Normal mobility of the mitral valve leaflets.  Trivial mitral valve regurgitation. No evidence of mitral valve stenosis.   Tricuspid Valve: The tricuspid valve is normal in structure. Tricuspid  valve regurgitation is mild . No evidence of tricuspid stenosis.   Aortic Valve: The aortic valve is abnormal. Aortic valve regurgitation is  trivial. Aortic regurgitation PHT measures 532 msec. Severe aortic  stenosis is present. There is severe calcifcation of the aortic valve.  Aortic valve mean gradient measures 39.0  mmHg. Aortic valve peak gradient measures 65.0 mmHg. Aortic valve area, by  VTI measures 0.69 cm.   Pulmonic Valve: The pulmonic valve was normal in structure. Pulmonic valve  regurgitation is trivial. No evidence of pulmonic stenosis.   Aorta: Aortic dilatation noted. There is borderline dilatation of the  ascending aorta measuring 38 mm.   Venous: The inferior vena cava is normal in size with greater than 50%  respiratory variability,   suggesting right atrial pressure of 3 mmHg.   IAS/Shunts: No atrial level shunt detected by color flow Doppler.     LEFT VENTRICLE  PLAX 2D  LVIDd:     4.40 cm Diastology  LVIDs:     2.90 cm LV e' lateral:  6.09 cm/s  LV PW:     1.10 cm LV E/e' lateral: 19.4  LV IVS:    1.20 cm LV e' medial:  3.92 cm/s  LVOT diam:   2.00 cm LV E/e' medial: 30.1  LV SV:     63.84 ml  LV SV Index:  33.27  LVOT Area:   3.14 cm     RIGHT VENTRICLE       IVC  RV S prime:   17.60 cm/s IVC diam: 1.50 cm  TAPSE (M-mode): 2.8 cm  RVSP:      38.3 mmHg   LEFT ATRIUM       Index     RIGHT ATRIUM      Index  LA diam:    3.80 cm 2.38 cm/m RA Pressure: 3.00 mmHg  LA Vol (A2C):  82.6 ml 51.82 ml/m RA Area:   10.70 cm  LA Vol (A4C):  72.1 ml 45.23 ml/m RA Volume:  22.70 ml 14.24 ml/m  LA Biplane Vol: 78.3 ml 49.12 ml/m  AORTIC VALVE  AV Area (Vmax):  0.64 cm  AV Area (Vmean):  0.67 cm  AV Area (VTI):   0.69 cm  AV Vmax:      403.00 cm/s  AV Vmean:     283.500 cm/s  AV VTI:      0.921 m  AV Peak Grad:   65.0 mmHg  AV Mean Grad:   39.0 mmHg  LVOT Vmax:     81.96 cm/s  LVOT Vmean:    60.620 cm/s  LVOT VTI:     0.203 m  LVOT/AV VTI ratio: 0.22  AI PHT:      532 msec    AORTA  Ao Root diam: 3.00 cm  Ao Asc diam: 3.80 cm   MV E velocity: 118.00 cm/s TRICUSPID VALVE  MV A velocity: 83.14 cm/s  TR Peak grad:  35.3 mmHg  MV E/A ratio: 1.42     TR Vmax:    297.00 cm/s               Estimated RAP: 3.00 mmHg               RVSP:      38.3 mmHg                 SHUNTS               Systemic VTI: 0.20 m               Systemic Diam: 2.00 cm   Gayatri Acharya MD  Electronically signed by Gayatri Acharya MD  Signature Date/Time: 03/15/2019/11:14:37 AM      RIGHT/LEFT HEART CATH AND CORONARY ANGIOGRAPHY  Conclusion    Prox RCA to Mid RCA lesion is 80% stenosed.  Prox Cx lesion is 75% stenosed.  Mid Cx to Dist Cx lesion is 50% stenosed.  Mid LAD lesion is 40% stenosed.  There is moderate aortic valve stenosis.   1. Severe 2 vessel CAD with severe stenosis of the proximal circumflex and mid-RCA 2. Moderate AS by direct pressure measurement, mean transvalvular gradient 25 mmHg and calculated AVA 1.22 square cm  Plan: Check 2D echo, review case with multidisciplinary   heart valve team. If severe AS by echo, likely will need to proceed with 2 vessel PCI with atherectomy prior to TAVR.      Indications  Severe aortic stenosis [I35.0 (ICD-10-CM)]  Procedural Details  Technical Details INDICATION: Severe, non-rheumatic AS  PROCEDURAL DETAILS: There was an indwelling IV in a right antecubital vein. Using normal sterile technique, the IV was changed out for a 5 Fr brachial sheath over a 0.018 inch wire. The right wrist was then prepped, draped, and anesthetized with 1% lidocaine. Using the modified Seldinger technique a 5/6 French Slender sheath was placed in the right radial artery. Intra-arterial verapamil was administered through the radial artery sheath. IV heparin was administered after a JR4 catheter was advanced into the central aorta. A Swan-Ganz catheter was used for the right heart catheterization. Standard protocol was followed for recording of right heart pressures and sampling of oxygen saturations. Fick cardiac output was calculated. Standard Judkins catheters were used for selective coronary angiography. LV pressure is recorded and an aortic valve pullback is performed. There were no immediate procedural complications. The patient was transferred to the post catheterization recovery area for further monitoring.    Estimated blood loss <50 mL.   During this procedure medications were administered to achieve and maintain moderate conscious sedation while the patient's heart rate, blood pressure, and oxygen saturation were continuously monitored and I was present face-to-face 100% of this time.  Medications (Filter: Administrations occurring from 09/11/18 1158 to 09/11/18 1314) (important)  Continuous medications are totaled by the amount administered until 09/11/18 1314.  lidocaine (PF) (XYLOCAINE) 1 % injection (mL) Total volume:  4 mL Date/Time  Rate/Dose/Volume Action  09/11/18 1220  2 mL Given  1223  2 mL Given    Heparin (Porcine) in NaCl 1000-0.9 UT/500ML-% SOLN (mL) Total volume:  1,000 mL Date/Time  Rate/Dose/Volume Action  09/11/18 1220  500 mL Given  1220   500 mL Given    Radial Cocktail/Verapamil only (mL) Total volume:  10 mL Date/Time  Rate/Dose/Volume Action  09/11/18 1223  10 mL Given    heparin injection (Units) Total dose:  3,500 Units Date/Time  Rate/Dose/Volume Action  09/11/18 1234  3,500 Units Given    iohexol (OMNIPAQUE) 350 MG/ML injection (mL) Total volume:  50 mL Date/Time  Rate/Dose/Volume Action  09/11/18 1246  50 mL Given    Contrast  Medication Name Total Dose  iohexol (OMNIPAQUE) 350 MG/ML injection 50 mL    Radiation/Fluoro  Fluoro time: 7.1 (min) DAP: 14.3 (Gycm2) Cumulative Air Kerma: 278.6 (mGy)  Coronary Findings  Diagnostic Dominance: Right Left Anterior Descending  Mid LAD lesion 40% stenosed  Mid LAD lesion is 40% stenosed. The lesion is moderately calcified.  Left Circumflex  Prox Cx lesion 75% stenosed  Prox Cx lesion is 75% stenosed. The lesion is severely calcified.  Mid Cx to Dist Cx lesion 50% stenosed  Mid Cx to Dist Cx lesion is 50% stenosed.  Right Coronary Artery  Prox RCA to Mid RCA lesion 80% stenosed  Prox RCA to Mid RCA lesion is 80% stenosed. The lesion is severely calcified.  Intervention  No interventions have been documented. Left Heart  Aortic Valve There is moderate aortic valve stenosis. The aortic valve is calcified. There is restricted aortic valve motion. Mean gradient 25 mmHg, Area 1.22 square cm  Coronary Diagrams  Diagnostic Dominance: Right  Intervention  Implants   No implant documentation for this case.  Syngo Images  Show images for CARDIAC CATHETERIZATION  Images on   Long Term Storage  Show images for Schnelle, Raelan A   Link to Procedure Log  Procedure Log    Hemo Data   Most Recent Value  Fick Cardiac Output 5.48 L/min  Fick Cardiac Output Index 3.42 (L/min)/BSA  Aortic Mean Gradient 24.7 mmHg  Aortic Peak Gradient 22 mmHg  Aortic Valve Area 1.22  Aortic Value Area Index 0.76 cm2/BSA  RA A Wave 9 mmHg  RA V Wave 8 mmHg  RA  Mean 5 mmHg  RV Systolic Pressure 37 mmHg  RV Diastolic Pressure 7 mmHg  RV EDP 13 mmHg  PA Systolic Pressure 33 mmHg  PA Diastolic Pressure 17 mmHg  PA Mean 24 mmHg  PW A Wave 20 mmHg  PW V Wave 24 mmHg  PW Mean 18 mmHg  AO Systolic Pressure 124 mmHg  AO Diastolic Pressure 69 mmHg  AO Mean 92 mmHg  LV Systolic Pressure 137 mmHg  LV Diastolic Pressure 5 mmHg  LV EDP 11 mmHg  AOp Systolic Pressure 122 mmHg  AOp Diastolic Pressure 65 mmHg  AOp Mean Pressure 88 mmHg  LVp Systolic Pressure 144 mmHg  LVp Diastolic Pressure 13 mmHg  LVp EDP Pressure 19 mmHg  QP/QS 1  TPVR Index 7.01 HRUI  TSVR Index 26.88 HRUI  PVR SVR Ratio 0.07  TPVR/TSVR Ratio 0.26      Cardiac TAVR CT  TECHNIQUE: The patient was scanned on a Phillips Force scanner. A 120 kV retrospective scan was triggered in the descending thoracic aorta at 111 HU's. Gantry rotation speed was 250 msecs and collimation was .6 mm. No beta blockade or nitro were given. The 3D data set was reconstructed in 5% intervals of the R-R cycle. Systolic and diastolic phases were analyzed on a dedicated work station using MPR, MIP and VRT modes. The patient received 80 cc of contrast.  FINDINGS: Aortic Root:  Aortic valve: Trileaflet  Aortic valve calcium score: 1546  Aortic annulus:  Diameter: 29mm x 21mm  Perimeter: 79mm  Area: 456 mm^2  Calcifications: Moderate annular calcifications adjacent to left coronary cusp  Coronary height: Min Left - 12mm, Max Left - 16mm; Min Right - 13mm  Sinotubular height: Left cusp - 20mm; Right cusp - 19mm; Noncoronary cusp - 19mm  LVOT (as measured 3 mm below the annulus):  Diameter: 30mm x 20mm  Area: 471 mm^2  Calcifications: Moderate calcifications, located beneath left coronary cusp  Aortic sinus width: Left cusp -32mm ; Right cusp - 28mm; Noncoronary cusp - 31mm  Sinotubular junction width: 28mm x 27mm  Optimum Fluoroscopic Angle for Delivery:  LAO 27 CAU 11  Cardiac:  Right atrium: Mild enlargement  Right ventricle: Mild dilatation  Pulmonary arteries: Normal size  Pulmonary veins: Normal configuration  Left atrium: Mild enlargement  Left ventricle: Normal size  Pericardium: Normal thickness  Coronary arteries: Calcium score 2164 (99th percentile)  IMPRESSION: 1. Trileaflet aortic valve with severe calcifications (calcium score 1546)  2. Aortic annulus measures 29mm x 21mm in diameter with perimeter 79mm and area 456 mm^2. Annular measurements suitable for delivery of a 26mm Edwards-Sapien 3 valve  3. Moderate annular calcifications adjacent to the left coronary cusp, extending into the LVOT  4. Coronary to annulus distance is borderline low from the left main (12mm). Sufficient coronary to annulus distance from the RCA (13mm)  5. Optimum Fluoroscopic Angle for Delivery (centered on RCC): LAO 27 CAU 11  6. Coronary arteries are severely calcified (calcium score 2164, 99th percentile)   Electronically Signed     By: Christopher  Schumann MD   On: 04/03/2019 22:21    CT ANGIOGRAPHY CHEST, ABDOMEN AND PELVIS  TECHNIQUE: Multidetector CT imaging through the chest, abdomen and pelvis was performed using the standard protocol during bolus administration of intravenous contrast. Multiplanar reconstructed images and MIPs were obtained and reviewed to evaluate the vascular anatomy.  CONTRAST:  100mL OMNIPAQUE IOHEXOL 350 MG/ML SOLN  COMPARISON:  None.  FINDINGS: CTA CHEST FINDINGS  Cardiovascular: Heart size is normal. There is no significant pericardial fluid, thickening or pericardial calcification. There is aortic atherosclerosis, as well as atherosclerosis of the great vessels of the mediastinum and the coronary arteries, including calcified atherosclerotic plaque in the left main, left anterior descending, left circumflex and right coronary arteries. Severe thickening  calcification of the aortic valve. 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  Mediastinum/Lymph Nodes: No pathologically enlarged mediastinal or hilar lymph nodes. Moderate hiatal hernia. No axillary lymphadenopathy.  Lungs/Pleura: No suspicious appearing pulmonary nodules or masses are noted. No acute consolidative airspace disease. No pleural effusions.  Musculoskeletal/Soft Tissues: Chronic appearing compression fractures of T6 and T11, most severe at T11 where there is complete loss of anterior vertebral body height and an acute kyphotic deformity. Status post right shoulder arthroplasty. There are no aggressive appearing lytic or blastic lesions noted in the visualized portions of the skeleton.  CTA ABDOMEN AND PELVIS FINDINGS  Hepatobiliary: Subcentimeter low-attenuation lesion in segment 2 of the liver, too small to characterize, but statistically likely to represent a tiny cyst. No other suspicious hepatic lesions. No intra or extrahepatic biliary ductal dilatation. Gallbladder is normal in appearance.  Pancreas: No pancreatic mass. No pancreatic ductal dilatation. No pancreatic or peripancreatic fluid collections or inflammatory changes.  Spleen: Unremarkable.  Adrenals/Urinary Tract: Subcentimeter low-attenuation lesions in both kidneys, too small to characterize, but statistically likely to represent tiny cysts. No hydroureteronephrosis. Urinary bladder is normal in appearance. Right adrenal gland is normal. In the lateral limb of the left adrenal gland there is a 6 mm fatty attenuation lesion compatible with a small adrenal myelolipoma (axial image 81 of series 15).  Stomach/Bowel: The appearance of the stomach is normal. No pathologic dilatation of small bowel or colon. Numerous colonic diverticulae are noted, without surrounding inflammatory changes to suggest an acute diverticulitis at this time. Normal  appendix.  Vascular/Lymphatic: Aortic atherosclerosis, without evidence of aneurysm or dissection in the abdominal or pelvic vasculature. Vascular findings and measurements pertinent to potential TAVR procedure, as detailed below. No lymphadenopathy noted in the abdomen or pelvis.  Reproductive: Status post hysterectomy. Ovaries are not confidently identified may be surgically absent or atrophic.  Other: Small umbilical hernia containing only omental fat. No significant volume of ascites. No pneumoperitoneum.  Musculoskeletal: There are no aggressive appearing lytic or blastic lesions noted in the visualized portions of the skeleton. Old healed fractures of the parasymphyseal region of the right pubic bone and left inferior and superior pubic rami.  VASCULAR MEASUREMENTS PERTINENT TO TAVR:  AORTA:  Minimal Aortic Diameter-14 x 12 mm  Severity of Aortic Calcification-moderate  RIGHT PELVIS:  Right Common Iliac Artery -  Minimal Diameter-8.3 x 8.0 mm  Tortuosity-moderate  Calcification-moderate  Right External Iliac Artery -  Minimal Diameter-7.2 x 6.3 mm  Tortuosity-severe  Calcification-mild  Right Common Femoral Artery -  Minimal Diameter-6.7 x 6.4 mm  Tortuosity-mild  Calcification-moderate  LEFT PELVIS:  Left Common Iliac Artery -  Minimal Diameter-8.3 x 8.0 mm  Tortuosity-severe  Calcification-mild-to-moderate  Left External Iliac Artery -  Minimal Diameter-7.2   x 6.3 mm  Tortuosity-moderate  Calcification-none  Left Common Femoral Artery -  Minimal Diameter-6.7 x 6.4 mm  Tortuosity-severe  Calcification-mild-to-moderate  Review of the MIP images confirms the above findings.  IMPRESSION: 1. Vascular findings and measurements pertinent to potential TAVR procedure, as detailed above. 2. Severe thickening calcification of the aortic valve, compatible with the reported clinical history of severe  aortic stenosis. 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 3. Aortic atherosclerosis, in addition to left main and 3 vessel coronary artery disease. 4. Mild colonic diverticulosis without evidence of acute diverticulitis at this time. 5. Additional incidental findings, as above.   Electronically Signed   By: Daniel  Entrikin M.D.   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  She describes stable but worsening symptoms of increased fatigue with exertional chest discomfort and shortness of breath consistent with chronic diastolic congestive heart failure, [place] Heart Association functional class II.  Anginal symptoms coincide with shortness of breath and sound clinically stable.  Patient's clinical history is complicated by the presence of multiple other medical problems including what sounds to be stable mild dementia with fairly limited functional status at baseline.  I have personally reviewed the patient's recent transthoracic echocardiogram, diagnostic cardiac catheterization performed last August, and recent CT angiograms.  Echocardiogram confirms the presence of normal left ventricular systolic function with severe aortic stenosis.  The aortic valve is trileaflet with severe thickening, calcification, and restricted leaflet mobility involving all 3 leaflets of the aortic valve.  Peak velocity across the aortic valve measured greater than 4.0 m/s corresponding to aortic valve area calculated 0.67 cm.  Left ventricular systolic function remains normal with ejection fraction estimated 65%.  Diagnostic cardiac catheterization performed August 2020 revealed severe multivessel coronary artery disease with long segment diffuse high-grade stenosis of the mid right coronary artery and 75% stenosis of the proximal left circumflex coronary  artery.  There was nonobstructive disease in the left anterior descending coronary artery territory.  Right heart pressures were normal.  I agree the patient would benefit from coronary revascularization and aortic valve replacement.  However, I would not consider this elderly patient with marginal functional status and baseline dementia to be a candidate for conventional surgery.  Cardiac-gated CTA of the heart reveals anatomical characteristics consistent with aortic stenosis suitable for treatment by transcatheter aortic valve replacement without any significant complicating features other than one area of mild calcification in the aortic annulus that extends into the LV outflow tract and borderline low origins of the coronary arteries.  CTA of the aorta and iliac vessels demonstrate what appears to be adequate pelvic vascular access to facilitate a transfemoral approach although the left iliac system appears very tortuous.  Baseline EKG reveals sinus rhythm without significant AV conduction delay.    Plan:  The patient and her caregiver were counseled at length regarding treatment alternatives for management of severe symptomatic aortic stenosis and coronary artery disease. Alternative approaches such as conventional aortic valve replacement, transcatheter aortic valve replacement, and continued medical therapy without intervention were compared and contrasted at length.  The risks associated with conventional surgical aortic valve replacement and coronary artery bypass grafting were discussed in detail, as were expectations for post-operative convalescence, and why I would be reluctant to consider this patient a candidate for conventional surgery.  Issues specific to transcatheter aortic valve replacement were discussed including questions about long   term valve durability, the potential for paravalvular leak, possible increased risk of need for permanent pacemaker placement, and other technical  complications related to the procedure itself.  Long-term prognosis with medical therapy was discussed. This discussion was placed in the context of the patient's own specific clinical presentation and past medical history.  All of their questions have been addressed.  The patient desires to proceed with PCI and stenting of the coronary arteries followed by transcatheter aortic valve replacement had a delayed date in the near future.  Following the decision to proceed with transcatheter aortic valve replacement, a discussion has been held regarding what types of management strategies would be attempted intraoperatively in the event of life-threatening complications, including whether or not the patient would be considered a candidate for the use of cardiopulmonary bypass and/or conversion to open sternotomy for attempted surgical intervention.  The patient specifically requests that should a potentially life-threatening complication develop we would not attempt emergency median sternotomy and/or other aggressive surgical procedures.  The patient has been advised of a variety of complications that might develop including but not limited to risks of death, stroke, paravalvular leak, aortic dissection or other major vascular complications, aortic annulus rupture, device embolization, cardiac rupture or perforation, mitral regurgitation, acute myocardial infarction, arrhythmia, heart block or bradycardia requiring permanent pacemaker placement, congestive heart failure, respiratory failure, renal failure, pneumonia, infection, other late complications related to structural valve deterioration or migration, or other complications that might ultimately cause a temporary or permanent loss of functional independence or other long term morbidity.     I spent in excess of 90 minutes during the conduct of this office consultation and >50% of this time involved direct face-to-face encounter with the patient for counseling  and/or coordination of their care.      Jeffery Bachmeier H. Miryam Mcelhinney, MD 04/07/2019 9:58 AM   

## 2019-04-07 NOTE — Patient Instructions (Signed)
Continue all previous medications without any changes at this time  

## 2019-04-07 NOTE — H&P (View-Only) (Signed)
[facility]  MULTIDISCIPLINARY HEART VALVE CLINIC  CARDIOTHORACIC SURGERY CONSULTATION REPORT  Referring Provider is [name], MD Primary Cardiologist is Sherrill Raring, MD PCP is Coralee Rud, PA-C  Chief Complaint  Patient presents with  . Aortic Stenosis    Surgical eval for TAVR, review all studies/testing.Katherine KitchenMarland [name]? needs PT EVAL    HPI:  Patient is 74 year old female with history of aortic stenosis, coronary artery disease, hypertension, hyperlipidemia, type 2 diabetes mellitus, arthritis, fibromyalgia, anxiety, and dementia who has been referred for surgical consultation to discuss treatment options for management of severe symptomatic aortic stenosis and multivessel coronary artery disease.  Patient states she has known of presence of a heart murmur for many years.  She has been followed by Roxanne Mins for many years with multiple medical problems.  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  Echocardiograms have documented the presence of aortic stenosis that has gradually progressed in severity.  She was originally referred to the multidisciplinary heart valve clinic last summer at which time she was evaluated by Dr. Excell Seltzer.  Echocardiogram performed at that time revealed moderate aortic stenosis with normal left ventricular systolic function.  Diagnostic cardiac catheterization revealed multivessel coronary artery disease with long segment high-grade stenosis of the right coronary artery and 75% stenosis in the left circumflex.  There was nonobstructive disease in the left anterior descending coronary artery.  Mean transvalvular gradient across the aortic valve was measured 25 mmHg at that time, corresponding to aortic valve area calculated 1.22 cm.  Medical therapy was recommended.  The patient was recently seen in follow-up by Dr. Excell Seltzer.  She describes increased fatigue with symptoms of exertional shortness of breath and  chest discomfort.  Follow-up transthoracic echocardiogram revealed significant progression and severity of aortic stenosis.  Left ventricular systolic function remain normal.  CT angiography was performed and surgical consultation requested.  Patient is single and never been married.  She has no children.  She lives in an apartment by herself.  She has limited cognitive ability and physical mobility.  She is accompanied by a caregiver [name]) who stops by regularly to assist the patient with daily needs and provide transportation for purchasing groceries and taking her for physician appointments.  She has been followed by psychiatrist for many years for "nerves".  The patient has reportedly been diagnosed with some type of dementia, although details remain unclear.  By report the patient's functional status remains limited but quite stable.  She ambulates using a cane.  She has had some mechanical falls in the past because of poor balance.  There is no reported history of stroke.  Patient does admit to symptoms of exertional shortness of breath and chest discomfort.  She denies nocturnal symptoms.  She lives a sedentary lifestyle.  She reports occasional dizzy spells without syncope.  She denies history of PND, orthopnea, or lower extremity edema.  Past Medical History:  Diagnosis Date  . ANXIETY 07/31/2007  . CAD (coronary artery disease)   . Coronary artery disease involving native coronary artery of native heart with angina pectoris (HCC)   . DEPRESSION 09/05/2006  . DIABETES MELLITUS, TYPE II   . Diabetic foot ulcer (HCC) 10/26/2014  . DIVERTICULOSIS, COLON 09/05/2006  . FIBROMYALGIA 05/10/2009  . GERD 09/05/2006  . HYPERLIPIDEMIA 09/05/2006  . HYPERTENSION 09/05/2006  . MRSA infection 10/26/2014  . OSTEOARTHRITIS 09/05/2006  . OSTEOPOROSIS 07/31/2007  . Severe aortic stenosis    followed at [facility]; 06/14/16  EF 55-60%, mod-severe AS, peak/mean grad 26.46 mmHg/ 16.31 mmHg, AVA by continuity  eq 0.90 cm2  . Unspecified hearing loss 10/09/2007    Past Surgical History:  Procedure Laterality Date  . ABDOMINAL HYSTERECTOMY  12/08   Bladder tac, partial hysterectomy  . BREAST BIOPSY    . catarct removal     both eyes  . COLONOSCOPY    . I & D KNEE WITH POLY EXCHANGE Left 01/03/2013   Procedure: IRRIGATION AND DEBRIDEMENT LEFT KNEE WITH POLY EXCHANGE;  Surgeon: Kathryne Hitch, MD;  Location: [facility] ORS;  Service: Orthopedics;  Laterality: Left;  . inguinal herniorrhapy     right Dr. Freida Busman 06/2009  . REVERSE SHOULDER ARTHROPLASTY Right 09/07/2016  . REVERSE SHOULDER ARTHROPLASTY Right 09/07/2016   Procedure: REVERSE SHOULDER ARTHROPLASTY;  Surgeon: Jones Broom, MD;  Location: [facility] OR;  Service: Orthopedics;  Laterality: Right;  Right reverse shoulder arthroplasty  . RIGHT/LEFT HEART CATH AND CORONARY ANGIOGRAPHY N/A 09/11/2018   Procedure: RIGHT/LEFT HEART CATH AND CORONARY ANGIOGRAPHY;  Surgeon: [name], MD;  Location: [facility] INVASIVE CV LAB;  Service: Cardiovascular;  Laterality: N/A;  . TONSILLECTOMY    . TOTAL KNEE ARTHROPLASTY Left 12/20/2012   Procedure: LEFT TOTAL KNEE ARTHROPLASTY;  Surgeon: Kathryne Hitch, MD;  Location: [facility];  Service: Orthopedics;  Laterality: Left;    Family History  Problem Relation Age of Onset  . Coronary artery disease Other   . Diabetes Other   . Colon cancer Neg Hx   . Esophageal cancer Neg Hx   . Stomach cancer Neg Hx   . Rectal cancer Neg Hx     Social History   Socioeconomic History  . Marital status: Single    Spouse name: Not on file  . Number of children: Not on file  . Years of education: Not on file  . Highest education level: Not on file  Occupational History  . Occupation: retired [employer]    Employer: DISABLED  Tobacco Use  . Smoking status: Never Smoker  . Smokeless tobacco: Never Used  Substance and Sexual Activity  . Alcohol use: No    Alcohol/week: 0.0 standard drinks  . Drug use: No   . Sexual activity: Not on file  Other Topics Concern  . Not on file  Social History Narrative   LIves alone with home health services.   Social Determinants of Health   Financial Resource Strain:   . Difficulty of Paying Living Expenses: Not on file  Food Insecurity:   . Worried About Programme researcher, broadcasting/film/video in the Last Year: Not on file  . Ran Out of Food in the Last Year: Not on file  Transportation Needs:   . Lack of Transportation (Medical): Not on file  . Lack of Transportation (Non-Medical): Not on file  Physical Activity:   . Days of Exercise per Week: Not on file  . Minutes of Exercise per Session: Not on file  Stress:   . Feeling of Stress : Not on file  Social Connections:   . Frequency of Communication with Friends and Family: Not on file  . Frequency of Social Gatherings with Friends and Family: Not on file  . Attends Religious Services: Not on file  . Active Member of Clubs or Organizations: Not on file  . Attends Banker Meetings: Not on file  . Marital Status: Not on file  Intimate Partner Violence:   . Fear of Current or Ex-Partner: Not on file  . Emotionally  Abused: Not on file  . Physically Abused: Not on file  . Sexually Abused: Not on file    Current Outpatient Medications  Medication Sig Dispense Refill  . albuterol (VENTOLIN HFA) 108 (90 Base) MCG/ACT inhaler Inhale 1 puff into the lungs 2 (two) times daily as needed for wheezing or shortness of breath.     Katherine [name] aspirin EC 81 MG tablet Take 81 mg by mouth daily.     . benztropine (COGENTIN) 0.5 MG tablet Take 0.5 mg by mouth 2 (two) times daily.      . calcium-vitamin D (OSCAL WITH D) 500-200 MG-UNIT tablet Take 1 tablet by mouth 3 (three) times daily. (Patient taking differently: Take 1 tablet by mouth 2 (two) times daily. ) 270 tablet 3  . cetirizine (ZYRTEC) 10 MG tablet TAKE 1 TABLET BY MOUTH DAILY 30 tablet 0  . clindamycin (CLEOCIN) 300 MG capsule Take 600 mg by mouth See admin  instructions. 1 HOUR PRIOR TO DENTAL APPOINTMENTS    . diclofenac sodium (VOLTAREN) 1 % GEL Apply 2-4 g topically 4 (four) times daily as needed for pain.    . fluticasone (FLONASE) 50 MCG/ACT nasal spray Place 1 spray into both nostrils daily as needed for allergies.     Katherine [name] gabapentin (NEURONTIN) 100 MG capsule Take 100 mg by mouth 3 (three) times daily.     . hydrOXYzine (ATARAX/VISTARIL) 10 MG tablet Take 1 tablet (10 mg total) by mouth 2 (two) times daily as needed. 30 tablet 11  . ipratropium (ATROVENT) 0.06 % nasal spray USE 1 SPRAY IN EACH NOSTRIL THREE TIMES DAILY 30 mL 11  . metFORMIN (GLUCOPHAGE) 500 MG tablet Take 500 mg by mouth 2 (two) times daily.     . Multiple Vitamin (MULTIVITAMIN WITH MINERALS) TABS tablet Take 1 tablet by mouth daily.    Letta Pate DELICA LANCETS 33G MISC CHECK BLOOD SUGAR ONCE DAILY AS DIRECTED 100 each 0  . pantoprazole (PROTONIX) 40 MG tablet TAKE 1 TABLET BY MOUTH DAILY (Patient taking differently: Take 40 mg by mouth 2 (two) times daily. ) 90 tablet 0  . Potassium Chloride ER 20 MEQ TBCR Take 20 mEq by mouth 2 (two) times daily.   1  . pravastatin (PRAVACHOL) 20 MG tablet Take 20 mg by mouth daily.  3  . risperiDONE (RISPERDAL) 3 MG tablet Take 3 mg by mouth at bedtime.     . traMADol (ULTRAM) 50 MG tablet Take 50 mg by mouth 3 (three) times daily as needed (for pain.).     Katherine [name] UREA 20 INTENSIVE HYDRATING 20 % cream Apply 1 application topically daily as needed. For dry skin  6   No current facility-administered medications for this visit.    Allergies  Allergen Reactions  . Penicillins Other (See Comments)    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 PATIENT HAS HAD A PCN REACTION WITH IMMEDIATE RASH, FACIAL/TONGUE/THROAT SWELLING, SOB, OR LIGHTHEADEDNESS WITH HYPOTENSION:  #  #  #  YES  #  #  #   Has patient had a PCN reaction causing severe rash involving mucus membranes or skin necrosis: Unknown Has patient had a PCN reaction that required  hospitalization: Unknown Has patient had a PCN reaction occurring within the last 10 years: Unknown   . Pollen Extract     UNSPECIFIED REACTION   . Coconut Oil Itching    UNSPECIFIED REACTION   . Mellaril [Thioridazine] Itching  . Thioridazine Hcl Itching      Review of Systems:  General:  normal appetite, decreased energy, no weight gain, no weight loss, no fever  Cardiac:  + chest pain with exertion, no chest pain at rest, +SOB with exertion, no resting SOB, no PND, no orthopnea, no palpitations, no arrhythmia, no atrial fibrillation, no LE edema, + dizzy spells, no syncope  Respiratory:  + shortness of breath, no home oxygen, no productive cough, no dry cough, no bronchitis, no wheezing, no hemoptysis, no asthma, no pain with inspiration or cough, no sleep apnea, no CPAP at night  GI:   no difficulty swallowing, no reflux, no frequent heartburn, no hiatal hernia, no abdominal pain, no constipation, no diarrhea, no hematochezia, no hematemesis, no melena  GU:   no dysuria,  no frequency, no urinary tract infection, no hematuria, no  kidney stones, no kidney disease  Vascular:  no pain suggestive of claudication, no pain in feet, no leg cramps, no varicose veins, no DVT, no non-healing foot ulcer  Neuro:   no stroke, no TIA's, no seizures, no headaches, no temporary blindness one eye,  no slurred speech, no peripheral neuropathy, no chronic pain, + instability of gait, + memory/cognitive dysfunction  Musculoskeletal: + arthritis, no joint swelling, no myalgias, mild difficulty walking, limited mobility   Skin:   no rash, no itching, no skin infections, no pressure sores or ulcerations  Psych:   + anxiety, + depression, no nervousness, no unusual recent stress  Eyes:   + blurry vision, no floaters, no recent vision changes, does not wear glasses or contacts  ENT:   no hearing loss, no loose or painful teeth, no dentures, last saw dentist 2-3 months ago  Hematologic:  no easy bruising, no  abnormal bleeding, no clotting disorder, no frequent epistaxis  Endocrine:  + diabetes, does check CBG's at home           Physical Exam:   BP (!) 155/93 (BP Location: Left Arm, Patient Position: Sitting, Cuff Size: Normal)   Pulse 60   Temp (!) 97.5 F (36.4 C)   Resp 16   Ht 4' 10.5" (1.486 m)   Wt 141 lb (64 kg)   SpO2 100% Comment: RA  BMI 28.97 kg/m   General:  Elderly and frail-appearing  HEENT:  Unremarkable   Neck:   no JVD, no bruits, no adenopathy   Chest:   clear to auscultation, symmetrical breath sounds, no wheezes, no rhonchi   CV:   RRR, grade III/VI crescendo/decrescendo murmur heard best at RSB,  no diastolic murmur  Abdomen:  soft, non-tender, no masses   Extremities:  warm, well-perfused, pulses diminished but palpable, no LE edema  Rectal/GU  Deferred  Neuro:   Grossly non-focal and symmetrical throughout  Skin:   Clean and dry, no rashes, no breakdown   Diagnostic Tests:  EKG: NSR w/out significant AV conduction delay   ECHOCARDIOGRAM REPORT       Patient Name:  Katherine [name] Date of Exam: 03/14/2019  Medical Rec #: [id number]     Height:    58.5 in  Accession #:  [id number]    Weight:    144.0 lb  Date of Birth: 06/27/1945     BSA:     1.59 m  Patient Age:  73 years     BP:      126/78 mmHg  Patient Gender: F         HR:      66 bpm.  Exam Location: [address]   Procedure: 2D Echo, Cardiac Doppler  and Color Doppler   Indications:  I35.0    History:    Patient has prior history of Echocardiogram examinations,  most         recent 09/11/2018. AS; Risk Factors:Hypertension, Diabetes  and         Dyslipidemia.    Sonographer:  Samule Ohm RDCS  Referring Phys: [id number] MICHAEL COOPER   IMPRESSIONS    1. The aortic valve is abnormal. Aortic valve regurgitation is trivial .  Severe aortic valve stenosis. Severe aortic valve calcification with    severely reduced cusp separation. LVOT calcifications noted. Aortic valve  mean gradient measures 39.0 mmHg.  Aortic valve Vmax measures 4.03 m/s. Dimensionless index approximately  0.21. AVA approximately 0.67 cm 2.  2. Left ventricular ejection fraction, by estimation, is 65%. The left  ventricle has normal function. The left ventricle has no regional wall  motion abnormalities. There is mildly increased left ventricular  hypertrophy. Left ventricular diastolic  parameters are consistent with Grade II diastolic dysfunction  (pseudonormalization). Elevated left ventricular end-diastolic pressure.  3. Right ventricular systolic function is normal. The right ventricular  size is normal. The estimated right ventricular systolic pressure is 38.3  mmHg.  4. Left atrial size was severely dilated.  5. The mitral valve is abnormal. Trivial mitral valve regurgitation. No  evidence of mitral stenosis.  6. Aortic dilatation noted. There is borderline dilatation of the  ascending aorta measuring 38 mm.  7. The inferior vena cava is normal in size with greater than 50%  respiratory variability, suggesting right atrial pressure of 3 mmHg.   FINDINGS  Left Ventricle: Left ventricular ejection fraction, by estimation, is  65%. The left ventricle has normal function. The left ventricle has no  regional wall motion abnormalities. The left ventricular internal cavity  size was normal in size. There is  mildly increased left ventricular hypertrophy. Left ventricular diastolic  parameters are consistent with Grade II diastolic dysfunction  (pseudonormalization). Elevated left ventricular end-diastolic pressure.   Right Ventricle: The right ventricular size is normal. No increase in  right ventricular wall thickness. Right ventricular systolic function is  normal. The tricuspid regurgitant velocity is 2.97 m/s, and with an  assumed right atrial pressure of 3 mmHg,  the estimated right  ventricular systolic pressure is 38.3 mmHg.   Left Atrium: Left atrial size was severely dilated.   Right Atrium: Right atrial size was normal in size.   Pericardium: There is no evidence of pericardial effusion.   Mitral Valve: Severe LVOT calcification along aorto-mitral continutiy. The  mitral valve is abnormal. Normal mobility of the mitral valve leaflets.  Trivial mitral valve regurgitation. No evidence of mitral valve stenosis.   Tricuspid Valve: The tricuspid valve is normal in structure. Tricuspid  valve regurgitation is mild . No evidence of tricuspid stenosis.   Aortic Valve: The aortic valve is abnormal. Aortic valve regurgitation is  trivial. Aortic regurgitation PHT measures 532 msec. Severe aortic  stenosis is present. There is severe calcifcation of the aortic valve.  Aortic valve mean gradient measures 39.0  mmHg. Aortic valve peak gradient measures 65.0 mmHg. Aortic valve area, by  VTI measures 0.69 cm.   Pulmonic Valve: The pulmonic valve was normal in structure. Pulmonic valve  regurgitation is trivial. No evidence of pulmonic stenosis.   Aorta: Aortic dilatation noted. There is borderline dilatation of the  ascending aorta measuring 38 mm.   Venous: The inferior vena cava is normal in size with greater than 50%  respiratory variability,  suggesting right atrial pressure of 3 mmHg.   IAS/Shunts: No atrial level shunt detected by color flow Doppler.     LEFT VENTRICLE  PLAX 2D  LVIDd:     4.40 cm Diastology  LVIDs:     2.90 cm LV e' lateral:  6.09 cm/s  LV PW:     1.10 cm LV E/e' lateral: 19.4  LV IVS:    1.20 cm LV e' medial:  3.92 cm/s  LVOT diam:   2.00 cm LV E/e' medial: 30.1  LV SV:     63.84 ml  LV SV Index:  33.27  LVOT Area:   3.14 cm     RIGHT VENTRICLE       IVC  RV S prime:   17.60 cm/s IVC diam: 1.50 cm  TAPSE (M-mode): 2.8 cm  RVSP:      38.3 mmHg   LEFT ATRIUM       Index     RIGHT ATRIUM      Index  LA diam:    3.80 cm 2.38 cm/m RA Pressure: 3.00 mmHg  LA Vol (A2C):  82.6 ml 51.82 ml/m RA Area:   10.70 cm  LA Vol (A4C):  72.1 ml 45.23 ml/m RA Volume:  22.70 ml 14.24 ml/m  LA Biplane Vol: 78.3 ml 49.12 ml/m  AORTIC VALVE  AV Area (Vmax):  0.64 cm  AV Area (Vmean):  0.67 cm  AV Area (VTI):   0.69 cm  AV Vmax:      403.00 cm/s  AV Vmean:     283.500 cm/s  AV VTI:      0.921 m  AV Peak Grad:   65.0 mmHg  AV Mean Grad:   39.0 mmHg  LVOT Vmax:     81.96 cm/s  LVOT Vmean:    60.620 cm/s  LVOT VTI:     0.203 m  LVOT/AV VTI ratio: 0.22  AI PHT:      532 msec    AORTA  Ao Root diam: 3.00 cm  Ao Asc diam: 3.80 cm   MV E velocity: 118.00 cm/s TRICUSPID VALVE  MV A velocity: 83.14 cm/s  TR Peak grad:  35.3 mmHg  MV E/A ratio: 1.42     TR Vmax:    297.00 cm/s               Estimated RAP: 3.00 mmHg               RVSP:      38.3 mmHg                 SHUNTS               Systemic VTI: 0.20 m               Systemic Diam: 2.00 cm   Weston Brass MD  Electronically signed by Weston Brass MD  Signature Date/Time: 03/15/2019/11:14:37 AM      RIGHT/LEFT HEART CATH AND CORONARY ANGIOGRAPHY  Conclusion    Prox RCA to Mid RCA lesion is 80% stenosed.  Prox Cx lesion is 75% stenosed.  Mid Cx to Dist Cx lesion is 50% stenosed.  Mid LAD lesion is 40% stenosed.  There is moderate aortic valve stenosis.   1. Severe 2 vessel CAD with severe stenosis of the proximal circumflex and mid-RCA 2. Moderate AS by direct pressure measurement, mean transvalvular gradient 25 mmHg and calculated AVA 1.22 square cm  Plan: Check 2D echo, review case with multidisciplinary  heart valve team. If severe AS by echo, likely will need to proceed with 2 vessel PCI with atherectomy prior to TAVR.      Indications  Severe aortic stenosis [I35.0 (ICD-10-CM)]  Procedural Details  Technical Details INDICATION: Severe, non-rheumatic AS  PROCEDURAL DETAILS: There was an indwelling IV in a right antecubital vein. Using normal sterile technique, the IV was changed out for a 5 Fr brachial sheath over a 0.018 inch wire. The right wrist was then prepped, draped, and anesthetized with 1% lidocaine. Using the modified Seldinger technique a 5/6 French Slender sheath was placed in the right radial artery. Intra-arterial verapamil was administered through the radial artery sheath. IV heparin was administered after a JR4 catheter was advanced into the central aorta. A Swan-Ganz catheter was used for the right heart catheterization. Standard protocol was followed for recording of right heart pressures and sampling of oxygen saturations. Fick cardiac output was calculated. Standard Judkins catheters were used for selective coronary angiography. LV pressure is recorded and an aortic valve pullback is performed. There were no immediate procedural complications. The patient was transferred to the post catheterization recovery area for further monitoring.    Estimated blood loss <50 mL.   During this procedure medications were administered to achieve and maintain moderate conscious sedation while the patient's heart rate, blood pressure, and oxygen saturation were continuously monitored and I was present face-to-face 100% of this time.  Medications (Filter: Administrations occurring from 09/11/18 1158 to 09/11/18 1314) (important)  Continuous medications are totaled by the amount administered until 09/11/18 1314.  lidocaine (PF) (XYLOCAINE) 1 % injection (mL) Total volume:  4 mL Date/Time  Rate/Dose/Volume Action  09/11/18 1220  2 mL Given  1223  2 mL Given    Heparin (Porcine) in NaCl 1000-0.9 UT/500ML-% SOLN (mL) Total volume:  1,000 mL Date/Time  Rate/Dose/Volume Action  09/11/18 1220  500 mL Given  1220   500 mL Given    Radial Cocktail/Verapamil only (mL) Total volume:  10 mL Date/Time  Rate/Dose/Volume Action  09/11/18 1223  10 mL Given    heparin injection (Units) Total dose:  3,500 Units Date/Time  Rate/Dose/Volume Action  09/11/18 1234  3,500 Units Given    iohexol (OMNIPAQUE) 350 MG/ML injection (mL) Total volume:  50 mL Date/Time  Rate/Dose/Volume Action  09/11/18 1246  50 mL Given    Contrast  Medication Name Total Dose  iohexol (OMNIPAQUE) 350 MG/ML injection 50 mL    Radiation/Fluoro  Fluoro time: 7.1 (min) DAP: 14.3 (Gycm2) Cumulative Air Kerma: 278.6 (mGy)  Coronary Findings  Diagnostic Dominance: Right Left Anterior Descending  Mid LAD lesion 40% stenosed  Mid LAD lesion is 40% stenosed. The lesion is moderately calcified.  Left Circumflex  Prox Cx lesion 75% stenosed  Prox Cx lesion is 75% stenosed. The lesion is severely calcified.  Mid Cx to Dist Cx lesion 50% stenosed  Mid Cx to Dist Cx lesion is 50% stenosed.  Right Coronary Artery  Prox RCA to Mid RCA lesion 80% stenosed  Prox RCA to Mid RCA lesion is 80% stenosed. The lesion is severely calcified.  Intervention  No interventions have been documented. Left Heart  Aortic Valve There is moderate aortic valve stenosis. The aortic valve is calcified. There is restricted aortic valve motion. Mean gradient 25 mmHg, Area 1.22 square cm  Coronary Diagrams  Diagnostic Dominance: Right  Intervention  Implants   No implant documentation for this case.  Syngo Images  Show images for CARDIAC CATHETERIZATION  Images on  [employer] Term Storage  Show images for [employer], RONNETTA CURRINGTON to Procedure Log  Procedure Log    Hemo Data   Most Recent Value  Fick Cardiac Output 5.48 L/min  Fick Cardiac Output Index 3.42 (L/min)/BSA  Aortic Mean Gradient 24.7 mmHg  Aortic Peak Gradient 22 mmHg  Aortic Valve Area 1.22  Aortic Value Area Index 0.76 cm2/BSA  RA A Wave 9 mmHg  RA V Wave 8 mmHg  RA  Mean 5 mmHg  RV Systolic Pressure 37 mmHg  RV Diastolic Pressure 7 mmHg  RV EDP 13 mmHg  PA Systolic Pressure 33 mmHg  PA Diastolic Pressure 17 mmHg  PA Mean 24 mmHg  PW A Wave 20 mmHg  PW V Wave 24 mmHg  PW Mean 18 mmHg  AO Systolic Pressure 124 mmHg  AO Diastolic Pressure 69 mmHg  AO Mean 92 mmHg  LV Systolic Pressure 137 mmHg  LV Diastolic Pressure 5 mmHg  LV EDP 11 mmHg  AOp Systolic Pressure 122 mmHg  AOp Diastolic Pressure 65 mmHg  AOp Mean Pressure 88 mmHg  LVp Systolic Pressure 144 mmHg  LVp Diastolic Pressure 13 mmHg  LVp EDP Pressure 19 mmHg  QP/QS 1  TPVR Index 7.01 HRUI  TSVR Index 26.88 HRUI  PVR SVR Ratio 0.07  TPVR/TSVR Ratio 0.26      Cardiac TAVR CT  TECHNIQUE: The patient was scanned on a [employer]. A 120 kV retrospective scan was triggered in the descending thoracic aorta at 111 HU's. Gantry rotation speed was 250 msecs and collimation was .6 mm. No beta blockade or nitro were given. The 3D data set was reconstructed in 5% intervals of the R-R cycle. Systolic and diastolic phases were analyzed on a dedicated work station using MPR, MIP and VRT modes. The patient received 80 cc of contrast.  FINDINGS: Aortic Root:  Aortic valve: Trileaflet  Aortic valve calcium score: 1546  Aortic annulus:  Diameter: 29mm x 21mm  Perimeter: 79mm  Area: 456 mm^2  Calcifications: Moderate annular calcifications adjacent to left coronary cusp  Coronary height: Min Left - 12mm, Max Left - 16mm; Min Right - 13mm  Sinotubular height: Left cusp - 20mm; Right cusp - 19mm; Noncoronary cusp - 19mm  LVOT (as measured 3 mm below the annulus):  Diameter: 30mm x 20mm  Area: 471 mm^2  Calcifications: Moderate calcifications, located beneath left coronary cusp  Aortic sinus width: Left cusp -32mm ; Right cusp - 28mm; Noncoronary cusp - 31mm  Sinotubular junction width: 28mm x 27mm  Optimum Fluoroscopic Angle for Delivery:  LAO 27 CAU 11  Cardiac:  Right atrium: Mild enlargement  Right ventricle: Mild dilatation  Pulmonary arteries: Normal size  Pulmonary veins: Normal configuration  Left atrium: Mild enlargement  Left ventricle: Normal size  Pericardium: Normal thickness  Coronary arteries: Calcium score 2164 (99th percentile)  IMPRESSION: 1. Trileaflet aortic valve with severe calcifications (calcium score 1546)  2. Aortic annulus measures 29mm x 21mm in diameter with perimeter 79mm and area 456 mm^2. Annular measurements suitable for delivery of a 26mm Edwards-Sapien 3 valve  3. Moderate annular calcifications adjacent to the left coronary cusp, extending into the LVOT  4. Coronary to annulus distance is borderline low from the left main (12mm). Sufficient coronary to annulus distance from the RCA (13mm)  5. Optimum Fluoroscopic Angle for Delivery (centered on RCC): LAO 27 CAU 11  6. Coronary arteries are severely calcified (calcium score 2164, 99th percentile)   Electronically Signed  By: Oswaldo Milian MD   On: 04/03/2019 22:21    CT ANGIOGRAPHY CHEST, ABDOMEN AND PELVIS  TECHNIQUE: Multidetector CT imaging through the chest, abdomen and pelvis was performed using the standard protocol during bolus administration of intravenous contrast. Multiplanar reconstructed images and MIPs were obtained and reviewed to evaluate the vascular anatomy.  CONTRAST:  138mL OMNIPAQUE IOHEXOL 350 MG/ML SOLN  COMPARISON:  None.  FINDINGS: CTA CHEST FINDINGS  Cardiovascular: Heart size is normal. There is no significant pericardial fluid, thickening or pericardial calcification. There is aortic atherosclerosis, as well as atherosclerosis of the great vessels of the mediastinum and the coronary arteries, including calcified atherosclerotic plaque in the left main, left anterior descending, left circumflex and right coronary arteries. Severe thickening  calcification of the aortic valve. 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  Mediastinum/Lymph Nodes: No pathologically enlarged mediastinal or hilar lymph nodes. Moderate hiatal hernia. No axillary lymphadenopathy.  Lungs/Pleura: No suspicious appearing pulmonary nodules or masses are noted. No acute consolidative airspace disease. No pleural effusions.  Musculoskeletal/Soft Tissues: Chronic appearing compression fractures of T6 and T11, most severe at T11 where there is complete loss of anterior vertebral body height and an acute kyphotic deformity. Status post right shoulder arthroplasty. There are no aggressive appearing lytic or blastic lesions noted in the visualized portions of the skeleton.  CTA ABDOMEN AND PELVIS FINDINGS  Hepatobiliary: Subcentimeter low-attenuation lesion in segment 2 of the liver, too small to characterize, but statistically likely to represent a tiny cyst. No other suspicious hepatic lesions. No intra or extrahepatic biliary ductal dilatation. Gallbladder is normal in appearance.  Pancreas: No pancreatic mass. No pancreatic ductal dilatation. No pancreatic or peripancreatic fluid collections or inflammatory changes.  Spleen: Unremarkable.  Adrenals/Urinary Tract: Subcentimeter low-attenuation lesions in both kidneys, too small to characterize, but statistically likely to represent tiny cysts. No hydroureteronephrosis. Urinary bladder is normal in appearance. Right adrenal gland is normal. In the lateral limb of the left adrenal gland there is a 6 mm fatty attenuation lesion compatible with a small adrenal myelolipoma (axial image 81 of series 15).  Stomach/Bowel: The appearance of the stomach is normal. No pathologic dilatation of small bowel or colon. Numerous colonic diverticulae are noted, without surrounding inflammatory changes to suggest an acute diverticulitis at this time. Normal  appendix.  Vascular/Lymphatic: Aortic atherosclerosis, without evidence of aneurysm or dissection in the abdominal or pelvic vasculature. Vascular findings and measurements pertinent to potential TAVR procedure, as detailed below. No lymphadenopathy noted in the abdomen or pelvis.  Reproductive: Status post hysterectomy. Ovaries are not confidently identified may be surgically absent or atrophic.  Other: Small umbilical hernia containing only omental fat. No significant volume of ascites. No pneumoperitoneum.  Musculoskeletal: There are no aggressive appearing lytic or blastic lesions noted in the visualized portions of the skeleton. Old healed fractures of the parasymphyseal region of the right pubic bone and left inferior and superior pubic rami.  VASCULAR MEASUREMENTS PERTINENT TO TAVR:  AORTA:  Minimal Aortic Diameter-14 x 12 mm  Severity of Aortic Calcification-moderate  RIGHT PELVIS:  Right Common Iliac Artery -  Minimal Diameter-8.3 x 8.0 mm  Tortuosity-moderate  Calcification-moderate  Right External Iliac Artery -  Minimal Diameter-7.2 x 6.3 mm  Tortuosity-severe  Calcification-mild  Right Common Femoral Artery -  Minimal Diameter-6.7 x 6.4 mm  Tortuosity-mild  Calcification-moderate  LEFT PELVIS:  Left Common Iliac Artery -  Minimal Diameter-8.3 x 8.0 mm  Tortuosity-severe  Calcification-mild-to-moderate  Left External Iliac Artery -  Minimal Diameter-7.2  x 6.3 mm  Tortuosity-moderate  Calcification-none  Left Common Femoral Artery -  Minimal Diameter-6.7 x 6.4 mm  Tortuosity-severe  Calcification-mild-to-moderate  Review of the MIP images confirms the above findings.  IMPRESSION: 1. Vascular findings and measurements pertinent to potential TAVR procedure, as detailed above. 2. Severe thickening calcification of the aortic valve, compatible with the reported clinical history of severe  aortic stenosis. 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 3. Aortic atherosclerosis, in addition to left main and 3 vessel coronary artery disease. 4. Mild colonic diverticulosis without evidence of acute diverticulitis at this time. 5. Additional incidental findings, as above.   Electronically Signed   By: Trudie Reed M.D.   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  She describes stable but worsening symptoms of increased fatigue with exertional chest discomfort and shortness of breath consistent with chronic diastolic congestive heart failure, [place] Heart Association functional class II.  Anginal symptoms coincide with shortness of breath and sound clinically stable.  Patient's clinical history is complicated by the presence of multiple other medical problems including what sounds to be stable mild dementia with fairly limited functional status at baseline.  I have personally reviewed the patient's recent transthoracic echocardiogram, diagnostic cardiac catheterization performed last August, and recent CT angiograms.  Echocardiogram confirms the presence of normal left ventricular systolic function with severe aortic stenosis.  The aortic valve is trileaflet with severe thickening, calcification, and restricted leaflet mobility involving all 3 leaflets of the aortic valve.  Peak velocity across the aortic valve measured greater than 4.0 m/s corresponding to aortic valve area calculated 0.67 cm.  Left ventricular systolic function remains normal with ejection fraction estimated 65%.  Diagnostic cardiac catheterization performed August 2020 revealed severe multivessel coronary artery disease with long segment diffuse high-grade stenosis of the mid right coronary artery and 75% stenosis of the proximal left circumflex coronary  artery.  There was nonobstructive disease in the left anterior descending coronary artery territory.  Right heart pressures were normal.  I agree the patient would benefit from coronary revascularization and aortic valve replacement.  However, I would not consider this elderly patient with marginal functional status and baseline dementia to be a candidate for conventional surgery.  Cardiac-gated CTA of the heart reveals anatomical characteristics consistent with aortic stenosis suitable for treatment by transcatheter aortic valve replacement without any significant complicating features other than one area of mild calcification in the aortic annulus that extends into the LV outflow tract and borderline low origins of the coronary arteries.  CTA of the aorta and iliac vessels demonstrate what appears to be adequate pelvic vascular access to facilitate a transfemoral approach although the left iliac system appears very tortuous.  Baseline EKG reveals sinus rhythm without significant AV conduction delay.    Plan:  The patient and her caregiver were counseled at length regarding treatment alternatives for management of severe symptomatic aortic stenosis and coronary artery disease. Alternative approaches such as conventional aortic valve replacement, transcatheter aortic valve replacement, and continued medical therapy without intervention were compared and contrasted at length.  The risks associated with conventional surgical aortic valve replacement and coronary artery bypass grafting were discussed in detail, as were expectations for post-operative convalescence, and why I would be reluctant to consider this patient a candidate for conventional surgery.  Issues specific to transcatheter aortic valve replacement were discussed including questions about long  term valve durability, the potential for paravalvular leak, possible increased risk of need for permanent pacemaker placement, and other technical  complications related to the procedure itself.  Long-term prognosis with medical therapy was discussed. This discussion was placed in the context of the patient's own specific clinical presentation and past medical history.  All of their questions have been addressed.  The patient desires to proceed with PCI and stenting of the coronary arteries followed by transcatheter aortic valve replacement had a delayed date in the near future.  Following the decision to proceed with transcatheter aortic valve replacement, a discussion has been held regarding what types of management strategies would be attempted intraoperatively in the event of life-threatening complications, including whether or not the patient would be considered a candidate for the use of cardiopulmonary bypass and/or conversion to open sternotomy for attempted surgical intervention.  The patient specifically requests that should a potentially life-threatening complication develop we would not attempt emergency median sternotomy and/or other aggressive surgical procedures.  The patient has been advised of a variety of complications that might develop including but not limited to risks of death, stroke, paravalvular leak, aortic dissection or other major vascular complications, aortic annulus rupture, device embolization, cardiac rupture or perforation, mitral regurgitation, acute myocardial infarction, arrhythmia, heart block or bradycardia requiring permanent pacemaker placement, congestive heart failure, respiratory failure, renal failure, pneumonia, infection, other late complications related to structural valve deterioration or migration, or other complications that might ultimately cause a temporary or permanent loss of functional independence or other long term morbidity.     I spent in excess of 90 minutes during the conduct of this office consultation and >50% of this time involved direct face-to-face encounter with the patient for counseling  and/or coordination of their care.      Salvatore Decent. Cornelius Moras, MD 04/07/2019 9:58 AM

## 2019-04-07 NOTE — H&P (View-Only) (Signed)
[facility]  MULTIDISCIPLINARY HEART VALVE CLINIC  CARDIOTHORACIC SURGERY CONSULTATION REPORT  Referring Provider is Cooper, Michael, MD Primary Cardiologist is Rosario, Raymond T, MD PCP is Duran, Michael R, PA-C  Chief Complaint  Patient presents with  . Aortic Stenosis    Surgical eval for TAVR, review all studies/testing...? needs PT EVAL    HPI:  Patient is 73-year-old female with history of aortic stenosis, coronary artery disease, hypertension, hyperlipidemia, type 2 diabetes mellitus, arthritis, fibromyalgia, anxiety, and dementia who has been referred for surgical consultation to discuss treatment options for management of severe symptomatic aortic stenosis and multivessel coronary artery disease.  Patient states she has known of presence of a heart murmur for many years.  She has been followed by Michael Duran for many years with multiple medical problems.  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  Echocardiograms have documented the presence of aortic stenosis that has gradually progressed in severity.  She was originally referred to the multidisciplinary heart valve clinic last summer at which time she was evaluated by Dr. Cooper.  Echocardiogram performed at that time revealed moderate aortic stenosis with normal left ventricular systolic function.  Diagnostic cardiac catheterization revealed multivessel coronary artery disease with long segment high-grade stenosis of the right coronary artery and 75% stenosis in the left circumflex.  There was nonobstructive disease in the left anterior descending coronary artery.  Mean transvalvular gradient across the aortic valve was measured 25 mmHg at that time, corresponding to aortic valve area calculated 1.22 cm.  Medical therapy was recommended.  The patient was recently seen in follow-up by Dr. Cooper.  She describes increased fatigue with symptoms of exertional shortness of breath and  chest discomfort.  Follow-up transthoracic echocardiogram revealed significant progression and severity of aortic stenosis.  Left ventricular systolic function remain normal.  CT angiography was performed and surgical consultation requested.  Patient is single and never been married.  She has no children.  She lives in an apartment by herself.  She has limited cognitive ability and physical mobility.  She is accompanied by a caregiver (Faye Harley) who stops by regularly to assist the patient with daily needs and provide transportation for purchasing groceries and taking her for physician appointments.  She has been followed by psychiatrist for many years for "nerves".  The patient has reportedly been diagnosed with some type of dementia, although details remain unclear.  By report the patient's functional status remains limited but quite stable.  She ambulates using a cane.  She has had some mechanical falls in the past because of poor balance.  There is no reported history of stroke.  Patient does admit to symptoms of exertional shortness of breath and chest discomfort.  She denies nocturnal symptoms.  She lives a sedentary lifestyle.  She reports occasional dizzy spells without syncope.  She denies history of PND, orthopnea, or lower extremity edema.  Past Medical History:  Diagnosis Date  . ANXIETY 07/31/2007  . CAD (coronary artery disease)   . Coronary artery disease involving native coronary artery of native heart with angina pectoris (HCC)   . DEPRESSION 09/05/2006  . DIABETES MELLITUS, TYPE II   . Diabetic foot ulcer (HCC) 10/26/2014  . DIVERTICULOSIS, COLON 09/05/2006  . FIBROMYALGIA 05/10/2009  . GERD 09/05/2006  . HYPERLIPIDEMIA 09/05/2006  . HYPERTENSION 09/05/2006  . MRSA infection 10/26/2014  . OSTEOARTHRITIS 09/05/2006  . OSTEOPOROSIS 07/31/2007  . Severe aortic stenosis    followed at [facility]; 06/14/16   EF 55-60%, mod-severe AS, peak/mean grad 26.46 mmHg/ 16.31 mmHg, AVA by continuity  eq 0.90 cm2  . Unspecified hearing loss 10/09/2007    Past Surgical History:  Procedure Laterality Date  . ABDOMINAL HYSTERECTOMY  12/08   Bladder tac, partial hysterectomy  . BREAST BIOPSY    . catarct removal     both eyes  . COLONOSCOPY    . I & D KNEE WITH POLY EXCHANGE Left 01/03/2013   Procedure: IRRIGATION AND DEBRIDEMENT LEFT KNEE WITH POLY EXCHANGE;  Surgeon: Christopher Y Blackman, MD;  Location: [facility];  Service: Orthopedics;  Laterality: Left;  . inguinal herniorrhapy     right Dr. Allen 06/2009  . REVERSE SHOULDER ARTHROPLASTY Right 09/07/2016  . REVERSE SHOULDER ARTHROPLASTY Right 09/07/2016   Procedure: REVERSE SHOULDER ARTHROPLASTY;  Surgeon: Chandler, Justin, MD;  Location: [facility];  Service: Orthopedics;  Laterality: Right;  Right reverse shoulder arthroplasty  . RIGHT/LEFT HEART CATH AND CORONARY ANGIOGRAPHY N/A 09/11/2018   Procedure: RIGHT/LEFT HEART CATH AND CORONARY ANGIOGRAPHY;  Surgeon: Cooper, Michael, MD;  Location: [facility] IN[facility]AS[facility];  Service: Cardiovascular;  Laterality: N/A;  . TONSILLECTOMY    . TOTAL KNEE ARTHROPLASTY Left 12/20/2012   Procedure: LEFT TOTAL KNEE ARTHROPLASTY;  Surgeon: Christopher Y Blackman, MD;  Location: [facility];  Service: Orthopedics;  Laterality: Left;    Family History  Problem Relation Age of Onset  . Coronary artery disease Other   . Diabetes Other   . Colon cancer Neg Hx   . Esophageal cancer Neg Hx   . Stomach cancer Neg Hx   . Rectal cancer Neg Hx     Social History   Socioeconomic History  . Marital status: Single    Spouse name: Not on file  . Number of children: Not on file  . Years of education: Not on file  . Highest education level: Not on file  Occupational History  . Occupation: retired [employer]    Employer: DISABLED  Tobacco Use  . Smoking status: Never Smoker  . Smokeless tobacco: Never Used  Substance and Sexual Activity  . Alcohol use: No    Alcohol/week: 0.0 standard drinks  . Drug use: No   . Sexual activity: Not on file  Other Topics Concern  . Not on file  Social History Narrative   LIves alone with home health services.   Social Determinants of Health   Financial Resource Strain:   . Difficulty of Paying Living Expenses: Not on file  Food Insecurity:   . Worried About Running Out of Food in the Last Year: Not on file  . Ran Out of Food in the Last Year: Not on file  Transportation Needs:   . Lack of Transportation (Medical): Not on file  . Lack of Transportation (Non-Medical): Not on file  Physical Activity:   . Days of Exercise per Week: Not on file  . Minutes of Exercise per Session: Not on file  Stress:   . Feeling of Stress : Not on file  Social Connections:   . Frequency of Communication with Friends and Family: Not on file  . Frequency of Social Gatherings with Friends and Family: Not on file  . Attends Religious Services: Not on file  . Active Member of Clubs or Organizations: Not on file  . Attends Club or Organization Meetings: Not on file  . Marital Status: Not on file  Intimate Partner Violence:   . Fear of Current or Ex-Partner: Not on file  . Emotionally   Abused: Not on file  . Physically Abused: Not on file  . Sexually Abused: Not on file    Current Outpatient Medications  Medication Sig Dispense Refill  . albuterol (VENTOLIN HFA) 108 (90 Base) MCG/ACT inhaler Inhale 1 puff into the lungs 2 (two) times daily as needed for wheezing or shortness of breath.     . aspirin EC 81 MG tablet Take 81 mg by mouth daily.     . benztropine (COGENTIN) 0.5 MG tablet Take 0.5 mg by mouth 2 (two) times daily.      . calcium-vitamin D (OSCAL WITH D) 500-200 MG-UNIT tablet Take 1 tablet by mouth 3 (three) times daily. (Patient taking differently: Take 1 tablet by mouth 2 (two) times daily. ) 270 tablet 3  . cetirizine (ZYRTEC) 10 MG tablet TAKE 1 TABLET BY MOUTH DAILY 30 tablet 0  . clindamycin (CLEOCIN) 300 MG capsule Take 600 mg by mouth See admin  instructions. 1 HOUR PRIOR TO DENTAL APPOINTMENTS    . diclofenac sodium (VOLTAREN) 1 % GEL Apply 2-4 g topically 4 (four) times daily as needed for pain.    . fluticasone (FLONASE) 50 MCG/ACT nasal spray Place 1 spray into both nostrils daily as needed for allergies.     . gabapentin (NEURONTIN) 100 MG capsule Take 100 mg by mouth 3 (three) times daily.     . hydrOXYzine (ATARAX/VISTARIL) 10 MG tablet Take 1 tablet (10 mg total) by mouth 2 (two) times daily as needed. 30 tablet 11  . ipratropium (ATROVENT) 0.06 % nasal spray USE 1 SPRAY IN EACH NOSTRIL THREE TIMES DAILY 30 mL 11  . metFORMIN (GLUCOPHAGE) 500 MG tablet Take 500 mg by mouth 2 (two) times daily.     . Multiple Vitamin (MULTIVITAMIN WITH MINERALS) TABS tablet Take 1 tablet by mouth daily.    . ONETOUCH DELICA LANCETS 33G MISC CHECK BLOOD SUGAR ONCE DAILY AS DIRECTED 100 each 0  . pantoprazole (PROTONIX) 40 MG tablet TAKE 1 TABLET BY MOUTH DAILY (Patient taking differently: Take 40 mg by mouth 2 (two) times daily. ) 90 tablet 0  . Potassium Chloride ER 20 MEQ TBCR Take 20 mEq by mouth 2 (two) times daily.   1  . pravastatin (PRAVACHOL) 20 MG tablet Take 20 mg by mouth daily.  3  . risperiDONE (RISPERDAL) 3 MG tablet Take 3 mg by mouth at bedtime.     . traMADol (ULTRAM) 50 MG tablet Take 50 mg by mouth 3 (three) times daily as needed (for pain.).     . UREA 20 INTENSIVE HYDRATING 20 % cream Apply 1 application topically daily as needed. For dry skin  6   No current facility-administered medications for this visit.    Allergies  Allergen Reactions  . Penicillins Other (See Comments)    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 PATIENT HAS HAD A PCN REACTION WITH IMMEDIATE RASH, FACIAL/TONGUE/THROAT SWELLING, SOB, OR LIGHTHEADEDNESS WITH HYPOTENSION:  #  #  #  YES  #  #  #   Has patient had a PCN reaction causing severe rash involving mucus membranes or skin necrosis: Unknown Has patient had a PCN reaction that required  hospitalization: Unknown Has patient had a PCN reaction occurring within the last 10 years: Unknown   . Pollen Extract     UNSPECIFIED REACTION   . Coconut Oil Itching    UNSPECIFIED REACTION   . Mellaril [Thioridazine] Itching  . Thioridazine Hcl Itching      Review of Systems:     General:  normal appetite, decreased energy, no weight gain, no weight loss, no fever  Cardiac:  + chest pain with exertion, no chest pain at rest, +SOB with exertion, no resting SOB, no PND, no orthopnea, no palpitations, no arrhythmia, no atrial fibrillation, no LE edema, + dizzy spells, no syncope  Respiratory:  + shortness of breath, no home oxygen, no productive cough, no dry cough, no bronchitis, no wheezing, no hemoptysis, no asthma, no pain with inspiration or cough, no sleep apnea, no CPAP at night  GI:   no difficulty swallowing, no reflux, no frequent heartburn, no hiatal hernia, no abdominal pain, no constipation, no diarrhea, no hematochezia, no hematemesis, no melena  GU:   no dysuria,  no frequency, no urinary tract infection, no hematuria, no  kidney stones, no kidney disease  Vascular:  no pain suggestive of claudication, no pain in feet, no leg cramps, no varicose veins, no DVT, no non-healing foot ulcer  Neuro:   no stroke, no TIA's, no seizures, no headaches, no temporary blindness one eye,  no slurred speech, no peripheral neuropathy, no chronic pain, + instability of gait, + memory/cognitive dysfunction  Musculoskeletal: + arthritis, no joint swelling, no myalgias, mild difficulty walking, limited mobility   Skin:   no rash, no itching, no skin infections, no pressure sores or ulcerations  Psych:   + anxiety, + depression, no nervousness, no unusual recent stress  Eyes:   + blurry vision, no floaters, no recent vision changes, does not wear glasses or contacts  ENT:   no hearing loss, no loose or painful teeth, no dentures, last saw dentist 2-3 months ago  Hematologic:  no easy bruising, no  abnormal bleeding, no clotting disorder, no frequent epistaxis  Endocrine:  + diabetes, does check CBG's at home           Physical Exam:   BP (!) 155/93 (BP Location: Left Arm, Patient Position: Sitting, Cuff Size: Normal)   Pulse 60   Temp (!) 97.5 F (36.4 C)   Resp 16   Ht 4' 10.5" (1.486 m)   Wt 141 lb (64 kg)   SpO2 100% Comment: RA  BMI 28.97 kg/m   General:  Elderly and frail-appearing  HEENT:  Unremarkable   Neck:   no JVD, no bruits, no adenopathy   Chest:   clear to auscultation, symmetrical breath sounds, no wheezes, no rhonchi   CV:   RRR, grade III/VI crescendo/decrescendo murmur heard best at RSB,  no diastolic murmur  Abdomen:  soft, non-tender, no masses   Extremities:  warm, well-perfused, pulses diminished but palpable, no LE edema  Rectal/GU  Deferred  Neuro:   Grossly non-focal and symmetrical throughout  Skin:   Clean and dry, no rashes, no breakdown   Diagnostic Tests:  EKG: NSR w/out significant AV conduction delay   ECHOCARDIOGRAM REPORT       Patient Name:  Katherine [name] Date of Exam: 03/14/2019  Medical Rec #: [id number]     Height:    58.5 in  Accession #:  [id number]    Weight:    144.0 lb  Date of Birth: 07/04/1945     BSA:     1.59 m  Patient Age:  73 years     BP:      126/78 mmHg  Patient Gender: F         HR:      66 bpm.  Exam Location: [address]   Procedure: 2D Echo, Cardiac Doppler   and Color Doppler   Indications:  I35.0    History:    Patient has prior history of Echocardiogram examinations,  most         recent 09/11/2018. AS; Risk Factors:Hypertension, Diabetes  and         Dyslipidemia.    Sonographer:  William Edwards RDCS  Referring Phys: [id number] MICHAEL COOPER   IMPRESSIONS    1. The aortic valve is abnormal. Aortic valve regurgitation is trivial .  Severe aortic valve stenosis. Severe aortic valve calcification with    severely reduced cusp separation. LVOT calcifications noted. Aortic valve  mean gradient measures 39.0 mmHg.  Aortic valve Vmax measures 4.03 m/s. Dimensionless index approximately  0.21. AVA approximately 0.67 cm 2.  2. Left ventricular ejection fraction, by estimation, is 65%. The left  ventricle has normal function. The left ventricle has no regional wall  motion abnormalities. There is mildly increased left ventricular  hypertrophy. Left ventricular diastolic  parameters are consistent with Grade II diastolic dysfunction  (pseudonormalization). Elevated left ventricular end-diastolic pressure.  3. Right ventricular systolic function is normal. The right ventricular  size is normal. The estimated right ventricular systolic pressure is 38.3  mmHg.  4. Left atrial size was severely dilated.  5. The mitral valve is abnormal. Trivial mitral valve regurgitation. No  evidence of mitral stenosis.  6. Aortic dilatation noted. There is borderline dilatation of the  ascending aorta measuring 38 mm.  7. The inferior vena cava is normal in size with greater than 50%  respiratory variability, suggesting right atrial pressure of 3 mmHg.   FINDINGS  Left Ventricle: Left ventricular ejection fraction, by estimation, is  65%. The left ventricle has normal function. The left ventricle has no  regional wall motion abnormalities. The left ventricular internal cavity  size was normal in size. There is  mildly increased left ventricular hypertrophy. Left ventricular diastolic  parameters are consistent with Grade II diastolic dysfunction  (pseudonormalization). Elevated left ventricular end-diastolic pressure.   Right Ventricle: The right ventricular size is normal. No increase in  right ventricular wall thickness. Right ventricular systolic function is  normal. The tricuspid regurgitant velocity is 2.97 m/s, and with an  assumed right atrial pressure of 3 mmHg,  the estimated right  ventricular systolic pressure is 38.3 mmHg.   Left Atrium: Left atrial size was severely dilated.   Right Atrium: Right atrial size was normal in size.   Pericardium: There is no evidence of pericardial effusion.   Mitral Valve: Severe LVOT calcification along aorto-mitral continutiy. The  mitral valve is abnormal. Normal mobility of the mitral valve leaflets.  Trivial mitral valve regurgitation. No evidence of mitral valve stenosis.   Tricuspid Valve: The tricuspid valve is normal in structure. Tricuspid  valve regurgitation is mild . No evidence of tricuspid stenosis.   Aortic Valve: The aortic valve is abnormal. Aortic valve regurgitation is  trivial. Aortic regurgitation PHT measures 532 msec. Severe aortic  stenosis is present. There is severe calcifcation of the aortic valve.  Aortic valve mean gradient measures 39.0  mmHg. Aortic valve peak gradient measures 65.0 mmHg. Aortic valve area, by  VTI measures 0.69 cm.   Pulmonic Valve: The pulmonic valve was normal in structure. Pulmonic valve  regurgitation is trivial. No evidence of pulmonic stenosis.   Aorta: Aortic dilatation noted. There is borderline dilatation of the  ascending aorta measuring 38 mm.   Venous: The inferior vena cava is normal in size with greater than 50%  respiratory variability,   suggesting right atrial pressure of 3 mmHg.   IAS/Shunts: No atrial level shunt detected by color flow Doppler.     LEFT VENTRICLE  PLAX 2D  LVIDd:     4.40 cm Diastology  LVIDs:     2.90 cm LV e' lateral:  6.09 cm/s  LV PW:     1.10 cm LV E/e' lateral: 19.4  LV IVS:    1.20 cm LV e' medial:  3.92 cm/s  LVOT diam:   2.00 cm LV E/e' medial: 30.1  LV SV:     63.84 ml  LV SV Index:  33.27  LVOT Area:   3.14 cm     RIGHT VENTRICLE       IVC  RV S prime:   17.60 cm/s IVC diam: 1.50 cm  TAPSE (M-mode): 2.8 cm  RVSP:      38.3 mmHg   LEFT ATRIUM       Index     RIGHT ATRIUM      Index  LA diam:    3.80 cm 2.38 cm/m RA Pressure: 3.00 mmHg  LA Vol (A2C):  82.6 ml 51.82 ml/m RA Area:   10.70 cm  LA Vol (A4C):  72.1 ml 45.23 ml/m RA Volume:  22.70 ml 14.24 ml/m  LA Biplane Vol: 78.3 ml 49.12 ml/m  AORTIC VALVE  AV Area (Vmax):  0.64 cm  AV Area (Vmean):  0.67 cm  AV Area (VTI):   0.69 cm  AV Vmax:      403.00 cm/s  AV Vmean:     283.500 cm/s  AV VTI:      0.921 m  AV Peak Grad:   65.0 mmHg  AV Mean Grad:   39.0 mmHg  LVOT Vmax:     81.96 cm/s  LVOT Vmean:    60.620 cm/s  LVOT VTI:     0.203 m  LVOT/AV VTI ratio: 0.22  AI PHT:      532 msec    AORTA  Ao Root diam: 3.00 cm  Ao Asc diam: 3.80 cm   MV E velocity: 118.00 cm/s TRICUSPID VALVE  MV A velocity: 83.14 cm/s  TR Peak grad:  35.3 mmHg  MV E/A ratio: 1.42     TR Vmax:    297.00 cm/s               Estimated RAP: 3.00 mmHg               RVSP:      38.3 mmHg                 SHUNTS               Systemic VTI: 0.20 m               Systemic Diam: 2.00 cm   Gayatri Acharya MD  Electronically signed by Gayatri Acharya MD  Signature Date/Time: 03/15/2019/11:14:37 AM      RIGHT/LEFT HEART CATH AND CORONARY ANGIOGRAPHY  Conclusion    Prox RCA to Mid RCA lesion is 80% stenosed.  Prox Cx lesion is 75% stenosed.  Mid Cx to Dist Cx lesion is 50% stenosed.  Mid LAD lesion is 40% stenosed.  There is moderate aortic valve stenosis.   1. Severe 2 vessel CAD with severe stenosis of the proximal circumflex and mid-RCA 2. Moderate AS by direct pressure measurement, mean transvalvular gradient 25 mmHg and calculated AVA 1.22 square cm  Plan: Check 2D echo, review case with multidisciplinary   heart valve team. If severe AS by echo, likely will need to proceed with 2 vessel PCI with atherectomy prior to TAVR.      Indications  Severe aortic stenosis [I35.0 (ICD-10-CM)]  Procedural Details  Technical Details INDICATION: Severe, non-rheumatic AS  PROCEDURAL DETAILS: There was an indwelling IV in a right antecubital vein. Using normal sterile technique, the IV was changed out for a 5 Fr brachial sheath over a 0.018 inch wire. The right wrist was then prepped, draped, and anesthetized with 1% lidocaine. Using the modified Seldinger technique a 5/6 French Slender sheath was placed in the right radial artery. Intra-arterial verapamil was administered through the radial artery sheath. IV heparin was administered after a JR4 catheter was advanced into the central aorta. A Swan-Ganz catheter was used for the right heart catheterization. Standard protocol was followed for recording of right heart pressures and sampling of oxygen saturations. Fick cardiac output was calculated. Standard Judkins catheters were used for selective coronary angiography. LV pressure is recorded and an aortic valve pullback is performed. There were no immediate procedural complications. The patient was transferred to the post catheterization recovery area for further monitoring.    Estimated blood loss <50 mL.   During this procedure medications were administered to achieve and maintain moderate conscious sedation while the patient's heart rate, blood pressure, and oxygen saturation were continuously monitored and I was present face-to-face 100% of this time.  Medications (Filter: Administrations occurring from 09/11/18 1158 to 09/11/18 1314) (important)  Continuous medications are totaled by the amount administered until 09/11/18 1314.  lidocaine (PF) (XYLOCAINE) 1 % injection (mL) Total volume:  4 mL Date/Time  Rate/Dose/Volume Action  09/11/18 1220  2 mL Given  1223  2 mL Given    Heparin (Porcine) in NaCl 1000-0.9 UT/500ML-% SOLN (mL) Total volume:  1,000 mL Date/Time  Rate/Dose/Volume Action  09/11/18 1220  500 mL Given  1220   500 mL Given    Radial Cocktail/Verapamil only (mL) Total volume:  10 mL Date/Time  Rate/Dose/Volume Action  09/11/18 1223  10 mL Given    heparin injection (Units) Total dose:  3,500 Units Date/Time  Rate/Dose/Volume Action  09/11/18 1234  3,500 Units Given    iohexol (OMNIPAQUE) 350 MG/ML injection (mL) Total volume:  50 mL Date/Time  Rate/Dose/Volume Action  09/11/18 1246  50 mL Given    Contrast  Medication Name Total Dose  iohexol (OMNIPAQUE) 350 MG/ML injection 50 mL    Radiation/Fluoro  Fluoro time: 7.1 (min) DAP: 14.3 (Gycm2) Cumulative Air Kerma: 278.6 (mGy)  Coronary Findings  Diagnostic Dominance: Right Left Anterior Descending  Mid LAD lesion 40% stenosed  Mid LAD lesion is 40% stenosed. The lesion is moderately calcified.  Left Circumflex  Prox Cx lesion 75% stenosed  Prox Cx lesion is 75% stenosed. The lesion is severely calcified.  Mid Cx to Dist Cx lesion 50% stenosed  Mid Cx to Dist Cx lesion is 50% stenosed.  Right Coronary Artery  Prox RCA to Mid RCA lesion 80% stenosed  Prox RCA to Mid RCA lesion is 80% stenosed. The lesion is severely calcified.  Intervention  No interventions have been documented. Left Heart  Aortic Valve There is moderate aortic valve stenosis. The aortic valve is calcified. There is restricted aortic valve motion. Mean gradient 25 mmHg, Area 1.22 square cm  Coronary Diagrams  Diagnostic Dominance: Right  Intervention  Implants   No implant documentation for this case.  Syngo Images  Show images for CARDIAC CATHETERIZATION  Images on   Long Term Storage  Show images for Eskew, Peyson A   Link to Procedure Log  Procedure Log    Hemo Data   Most Recent Value  Fick Cardiac Output 5.48 L/min  Fick Cardiac Output Index 3.42 (L/min)/BSA  Aortic Mean Gradient 24.7 mmHg  Aortic Peak Gradient 22 mmHg  Aortic Valve Area 1.22  Aortic Value Area Index 0.76 cm2/BSA  RA A Wave 9 mmHg  RA V Wave 8 mmHg  RA  Mean 5 mmHg  RV Systolic Pressure 37 mmHg  RV Diastolic Pressure 7 mmHg  RV EDP 13 mmHg  PA Systolic Pressure 33 mmHg  PA Diastolic Pressure 17 mmHg  PA Mean 24 mmHg  PW A Wave 20 mmHg  PW V Wave 24 mmHg  PW Mean 18 mmHg  AO Systolic Pressure 124 mmHg  AO Diastolic Pressure 69 mmHg  AO Mean 92 mmHg  LV Systolic Pressure 137 mmHg  LV Diastolic Pressure 5 mmHg  LV EDP 11 mmHg  AOp Systolic Pressure 122 mmHg  AOp Diastolic Pressure 65 mmHg  AOp Mean Pressure 88 mmHg  LVp Systolic Pressure 144 mmHg  LVp Diastolic Pressure 13 mmHg  LVp EDP Pressure 19 mmHg  QP/QS 1  TPVR Index 7.01 HRUI  TSVR Index 26.88 HRUI  PVR SVR Ratio 0.07  TPVR/TSVR Ratio 0.26      Cardiac TAVR CT  TECHNIQUE: The patient was scanned on a Phillips Force scanner. A 120 kV retrospective scan was triggered in the descending thoracic aorta at 111 HU's. Gantry rotation speed was 250 msecs and collimation was .6 mm. No beta blockade or nitro were given. The 3D data set was reconstructed in 5% intervals of the R-R cycle. Systolic and diastolic phases were analyzed on a dedicated work station using MPR, MIP and VRT modes. The patient received 80 cc of contrast.  FINDINGS: Aortic Root:  Aortic valve: Trileaflet  Aortic valve calcium score: 1546  Aortic annulus:  Diameter: 29mm x 21mm  Perimeter: 79mm  Area: 456 mm^2  Calcifications: Moderate annular calcifications adjacent to left coronary cusp  Coronary height: Min Left - 12mm, Max Left - 16mm; Min Right - 13mm  Sinotubular height: Left cusp - 20mm; Right cusp - 19mm; Noncoronary cusp - 19mm  LVOT (as measured 3 mm below the annulus):  Diameter: 30mm x 20mm  Area: 471 mm^2  Calcifications: Moderate calcifications, located beneath left coronary cusp  Aortic sinus width: Left cusp -32mm ; Right cusp - 28mm; Noncoronary cusp - 31mm  Sinotubular junction width: 28mm x 27mm  Optimum Fluoroscopic Angle for Delivery:  LAO 27 CAU 11  Cardiac:  Right atrium: Mild enlargement  Right ventricle: Mild dilatation  Pulmonary arteries: Normal size  Pulmonary veins: Normal configuration  Left atrium: Mild enlargement  Left ventricle: Normal size  Pericardium: Normal thickness  Coronary arteries: Calcium score 2164 (99th percentile)  IMPRESSION: 1. Trileaflet aortic valve with severe calcifications (calcium score 1546)  2. Aortic annulus measures 29mm x 21mm in diameter with perimeter 79mm and area 456 mm^2. Annular measurements suitable for delivery of a 26mm Edwards-Sapien 3 valve  3. Moderate annular calcifications adjacent to the left coronary cusp, extending into the LVOT  4. Coronary to annulus distance is borderline low from the left main (12mm). Sufficient coronary to annulus distance from the RCA (13mm)  5. Optimum Fluoroscopic Angle for Delivery (centered on RCC): LAO 27 CAU 11  6. Coronary arteries are severely calcified (calcium score 2164, 99th percentile)   Electronically Signed     By: Christopher  Schumann MD   On: 04/03/2019 22:21    CT ANGIOGRAPHY CHEST, ABDOMEN AND PELVIS  TECHNIQUE: Multidetector CT imaging through the chest, abdomen and pelvis was performed using the standard protocol during bolus administration of intravenous contrast. Multiplanar reconstructed images and MIPs were obtained and reviewed to evaluate the vascular anatomy.  CONTRAST:  100mL OMNIPAQUE IOHEXOL 350 MG/ML SOLN  COMPARISON:  None.  FINDINGS: CTA CHEST FINDINGS  Cardiovascular: Heart size is normal. There is no significant pericardial fluid, thickening or pericardial calcification. There is aortic atherosclerosis, as well as atherosclerosis of the great vessels of the mediastinum and the coronary arteries, including calcified atherosclerotic plaque in the left main, left anterior descending, left circumflex and right coronary arteries. Severe thickening  calcification of the aortic valve. 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  Mediastinum/Lymph Nodes: No pathologically enlarged mediastinal or hilar lymph nodes. Moderate hiatal hernia. No axillary lymphadenopathy.  Lungs/Pleura: No suspicious appearing pulmonary nodules or masses are noted. No acute consolidative airspace disease. No pleural effusions.  Musculoskeletal/Soft Tissues: Chronic appearing compression fractures of T6 and T11, most severe at T11 where there is complete loss of anterior vertebral body height and an acute kyphotic deformity. Status post right shoulder arthroplasty. There are no aggressive appearing lytic or blastic lesions noted in the visualized portions of the skeleton.  CTA ABDOMEN AND PELVIS FINDINGS  Hepatobiliary: Subcentimeter low-attenuation lesion in segment 2 of the liver, too small to characterize, but statistically likely to represent a tiny cyst. No other suspicious hepatic lesions. No intra or extrahepatic biliary ductal dilatation. Gallbladder is normal in appearance.  Pancreas: No pancreatic mass. No pancreatic ductal dilatation. No pancreatic or peripancreatic fluid collections or inflammatory changes.  Spleen: Unremarkable.  Adrenals/Urinary Tract: Subcentimeter low-attenuation lesions in both kidneys, too small to characterize, but statistically likely to represent tiny cysts. No hydroureteronephrosis. Urinary bladder is normal in appearance. Right adrenal gland is normal. In the lateral limb of the left adrenal gland there is a 6 mm fatty attenuation lesion compatible with a small adrenal myelolipoma (axial image 81 of series 15).  Stomach/Bowel: The appearance of the stomach is normal. No pathologic dilatation of small bowel or colon. Numerous colonic diverticulae are noted, without surrounding inflammatory changes to suggest an acute diverticulitis at this time. Normal  appendix.  Vascular/Lymphatic: Aortic atherosclerosis, without evidence of aneurysm or dissection in the abdominal or pelvic vasculature. Vascular findings and measurements pertinent to potential TAVR procedure, as detailed below. No lymphadenopathy noted in the abdomen or pelvis.  Reproductive: Status post hysterectomy. Ovaries are not confidently identified may be surgically absent or atrophic.  Other: Small umbilical hernia containing only omental fat. No significant volume of ascites. No pneumoperitoneum.  Musculoskeletal: There are no aggressive appearing lytic or blastic lesions noted in the visualized portions of the skeleton. Old healed fractures of the parasymphyseal region of the right pubic bone and left inferior and superior pubic rami.  VASCULAR MEASUREMENTS PERTINENT TO TAVR:  AORTA:  Minimal Aortic Diameter-14 x 12 mm  Severity of Aortic Calcification-moderate  RIGHT PELVIS:  Right Common Iliac Artery -  Minimal Diameter-8.3 x 8.0 mm  Tortuosity-moderate  Calcification-moderate  Right External Iliac Artery -  Minimal Diameter-7.2 x 6.3 mm  Tortuosity-severe  Calcification-mild  Right Common Femoral Artery -  Minimal Diameter-6.7 x 6.4 mm  Tortuosity-mild  Calcification-moderate  LEFT PELVIS:  Left Common Iliac Artery -  Minimal Diameter-8.3 x 8.0 mm  Tortuosity-severe  Calcification-mild-to-moderate  Left External Iliac Artery -  Minimal Diameter-7.2   x 6.3 mm  Tortuosity-moderate  Calcification-none  Left Common Femoral Artery -  Minimal Diameter-6.7 x 6.4 mm  Tortuosity-severe  Calcification-mild-to-moderate  Review of the MIP images confirms the above findings.  IMPRESSION: 1. Vascular findings and measurements pertinent to potential TAVR procedure, as detailed above. 2. Severe thickening calcification of the aortic valve, compatible with the reported clinical history of severe  aortic stenosis. 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 3. Aortic atherosclerosis, in addition to left main and 3 vessel coronary artery disease. 4. Mild colonic diverticulosis without evidence of acute diverticulitis at this time. 5. Additional incidental findings, as above.   Electronically Signed   By: Daniel  Entrikin M.D.   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  She describes stable but worsening symptoms of increased fatigue with exertional chest discomfort and shortness of breath consistent with chronic diastolic congestive heart failure, New York Heart Association functional class II.  Anginal symptoms coincide with shortness of breath and sound clinically stable.  Patient's clinical history is complicated by the presence of multiple other medical problems including what sounds to be stable mild dementia with fairly limited functional status at baseline.  I have personally reviewed the patient's recent transthoracic echocardiogram, diagnostic cardiac catheterization performed last August, and recent CT angiograms.  Echocardiogram confirms the presence of normal left ventricular systolic function with severe aortic stenosis.  The aortic valve is trileaflet with severe thickening, calcification, and restricted leaflet mobility involving all 3 leaflets of the aortic valve.  Peak velocity across the aortic valve measured greater than 4.0 m/s corresponding to aortic valve area calculated 0.67 cm.  Left ventricular systolic function remains normal with ejection fraction estimated 65%.  Diagnostic cardiac catheterization performed August 2020 revealed severe multivessel coronary artery disease with long segment diffuse high-grade stenosis of the mid right coronary artery and 75% stenosis of the proximal left circumflex coronary  artery.  There was nonobstructive disease in the left anterior descending coronary artery territory.  Right heart pressures were normal.  I agree the patient would benefit from coronary revascularization and aortic valve replacement.  However, I would not consider this elderly patient with marginal functional status and baseline dementia to be a candidate for conventional surgery.  Cardiac-gated CTA of the heart reveals anatomical characteristics consistent with aortic stenosis suitable for treatment by transcatheter aortic valve replacement without any significant complicating features other than one area of mild calcification in the aortic annulus that extends into the LV outflow tract and borderline low origins of the coronary arteries.  CTA of the aorta and iliac vessels demonstrate what appears to be adequate pelvic vascular access to facilitate a transfemoral approach although the left iliac system appears very tortuous.  Baseline EKG reveals sinus rhythm without significant AV conduction delay.    Plan:  The patient and her caregiver were counseled at length regarding treatment alternatives for management of severe symptomatic aortic stenosis and coronary artery disease. Alternative approaches such as conventional aortic valve replacement, transcatheter aortic valve replacement, and continued medical therapy without intervention were compared and contrasted at length.  The risks associated with conventional surgical aortic valve replacement and coronary artery bypass grafting were discussed in detail, as were expectations for post-operative convalescence, and why I would be reluctant to consider this patient a candidate for conventional surgery.  Issues specific to transcatheter aortic valve replacement were discussed including questions about long   term valve durability, the potential for paravalvular leak, possible increased risk of need for permanent pacemaker placement, and other technical  complications related to the procedure itself.  Long-term prognosis with medical therapy was discussed. This discussion was placed in the context of the patient's own specific clinical presentation and past medical history.  All of their questions have been addressed.  The patient desires to proceed with PCI and stenting of the coronary arteries followed by transcatheter aortic valve replacement had a delayed date in the near future.  Following the decision to proceed with transcatheter aortic valve replacement, a discussion has been held regarding what types of management strategies would be attempted intraoperatively in the event of life-threatening complications, including whether or not the patient would be considered a candidate for the use of cardiopulmonary bypass and/or conversion to open sternotomy for attempted surgical intervention.  The patient specifically requests that should a potentially life-threatening complication develop we would not attempt emergency median sternotomy and/or other aggressive surgical procedures.  The patient has been advised of a variety of complications that might develop including but not limited to risks of death, stroke, paravalvular leak, aortic dissection or other major vascular complications, aortic annulus rupture, device embolization, cardiac rupture or perforation, mitral regurgitation, acute myocardial infarction, arrhythmia, heart block or bradycardia requiring permanent pacemaker placement, congestive heart failure, respiratory failure, renal failure, pneumonia, infection, other late complications related to structural valve deterioration or migration, or other complications that might ultimately cause a temporary or permanent loss of functional independence or other long term morbidity.     I spent in excess of 90 minutes during the conduct of this office consultation and >50% of this time involved direct face-to-face encounter with the patient for counseling  and/or coordination of their care.      Kazzandra Desaulniers H. Katha Kuehne, MD 04/07/2019 9:58 AM   

## 2019-04-09 ENCOUNTER — Telehealth: Payer: Self-pay | Admitting: Cardiovascular Disease

## 2019-04-09 ENCOUNTER — Other Ambulatory Visit: Payer: Self-pay

## 2019-04-09 MED ORDER — CLOPIDOGREL BISULFATE 75 MG PO TABS: 75.0000 mg | ORAL_TABLET | Freq: Every day | ORAL | 11 refills | Status: DC

## 2019-04-09 NOTE — Telephone Encounter (Signed)
I spoke with the pt's caregiver, Lucendia Herrlich, and reviewed pre-procedure instructions for 3/16 PCI/Atherectomy (instructions under letter tab). Lucendia Herrlich will pick up plavix Rx today so the pt can start this medication tomorrow. The pt will have covid test performed 3/12.

## 2019-04-09 NOTE — Telephone Encounter (Signed)
I spoke with the Katherine [name] and advised her that I will be speaking with her caregiver Lucendia Herrlich in detail with pre-procedure instructions.

## 2019-04-09 NOTE — Telephone Encounter (Signed)
   Pt said would like to speak with Olegario Messier or Leotis Shames, she said she wanted to know what she needs to do on or before her surgery on 04/15/19  Please call

## 2019-04-10 ENCOUNTER — Ambulatory Visit: Payer: Medicare Other

## 2019-04-11 ENCOUNTER — Other Ambulatory Visit (HOSPITAL_COMMUNITY)
Admission: RE | Admit: 2019-04-11 | Discharge: 2019-04-11 | Disposition: A | Discharge status: Home / Self Care | Payer: Medicare Other | Source: Ambulatory Visit | Attending: Cardiovascular Disease | Admitting: Cardiovascular Disease

## 2019-04-11 DIAGNOSIS — Z01812 Encounter for preprocedural laboratory examination: Secondary | ICD-10-CM | POA: Insufficient documentation

## 2019-04-11 DIAGNOSIS — Z20822 Contact with and (suspected) exposure to covid-19: Secondary | ICD-10-CM | POA: Diagnosis not present

## 2019-04-11 LAB — SARS CORONAVIRUS 2 (TAT 6-24 HRS): SARS Coronavirus 2: NEGATIVE

## 2019-04-11 NOTE — Telephone Encounter (Signed)
I spoke with the pt and the pharmacist at [facility] had contacted her to review her medication list prior to 3/16 procedure.  The pt said she skipped this medication on her list and she wanted to verify that we have this information.  I made the pt aware that this medication is on her list in Epic.

## 2019-04-11 NOTE — Telephone Encounter (Signed)
Patient states she is calling to follow up in regards to procedure. She states she was advised to call and state the name of a medication. She states the medication is Fluticasone Propionate 50 MG. Please return call to discuss.

## 2019-04-14 ENCOUNTER — Telehealth: Payer: Self-pay | Admitting: *Deleted

## 2019-04-14 NOTE — Telephone Encounter (Signed)
Pt contacted pre-catheterization scheduled at [facility] [id number] April 15, 2019 9 AM Verified arrival time and place: [facility] Main Entrance [facility]) at: 7 AM   No solid food after midnight prior to cath, clear liquids until 5 AM day of procedure. Contrast allergy: no  Hold: Metformin-day of procedure and 48 hours post procedure.  Except hold medications AM meds can be  taken pre-cath with sip of water including: ASA 81 mg Plavix 75 mg  Confirmed patient has responsible adult to drive home post procedure and observe 24 hours after arriving home: yes  Currently, due to Covid-19 pandemic, only one person will be allowed with patient. Must be the same person for patient's entire stay and will be required to wear a mask. They will be asked to wait in the waiting room for the duration of the patient's stay.  Patients are required to wear a mask when they enter the hospital.     COVID-19 Pre-Screening Questions:  . In the past 7 to 10 days have you had a cough,  shortness of breath, headache, congestion, fever (100 or greater) body aches, chills, sore throat, or sudden loss of taste or sense of smell? Allergic to pollen- congestion/shortness of breath . Have you been around anyone with known Covid 19 in the past 7-10 days? No  I reviewed procedure/mask/visitor instructions, COVID-19 screening questions with patient, she verbalized understanding, thanked me for call. I confirmed with patient that she did get prescription for Plavix and is currently taking daily, knows to take with ASA 81 mg AM of procedure before she goes to hospital.   I also reviewed procedure instructions with patient's friend (DPR), Wendall Mola requests  update on patient after procedure.

## 2019-04-15 ENCOUNTER — Encounter (HOSPITAL_COMMUNITY)
Admission: RE | Disposition: A | Discharge status: Home / Self Care | Payer: Self-pay | Source: Home / Self Care | Attending: Cardiovascular Disease

## 2019-04-15 ENCOUNTER — Other Ambulatory Visit: Payer: Self-pay

## 2019-04-15 ENCOUNTER — Ambulatory Visit (HOSPITAL_COMMUNITY)
Admission: RE | Admit: 2019-04-15 | Discharge: 2019-04-16 | Disposition: A | Discharge status: Home / Self Care | Payer: Medicare Other | Attending: Cardiovascular Disease | Admitting: Cardiovascular Disease

## 2019-04-15 DIAGNOSIS — E119 Type 2 diabetes mellitus without complications: Secondary | ICD-10-CM | POA: Diagnosis not present

## 2019-04-15 DIAGNOSIS — I10 Essential (primary) hypertension: Secondary | ICD-10-CM | POA: Insufficient documentation

## 2019-04-15 DIAGNOSIS — F419 Anxiety disorder, unspecified: Secondary | ICD-10-CM | POA: Insufficient documentation

## 2019-04-15 DIAGNOSIS — M6281 Muscle weakness (generalized): Secondary | ICD-10-CM | POA: Diagnosis not present

## 2019-04-15 DIAGNOSIS — I350 Nonrheumatic aortic (valve) stenosis: Secondary | ICD-10-CM

## 2019-04-15 DIAGNOSIS — I25119 Atherosclerotic heart disease of native coronary artery with unspecified angina pectoris: Secondary | ICD-10-CM

## 2019-04-15 DIAGNOSIS — Z7984 Long term (current) use of oral hypoglycemic drugs: Secondary | ICD-10-CM | POA: Diagnosis not present

## 2019-04-15 DIAGNOSIS — M797 Fibromyalgia: Secondary | ICD-10-CM | POA: Diagnosis not present

## 2019-04-15 DIAGNOSIS — F0390 Unspecified dementia without behavioral disturbance: Secondary | ICD-10-CM | POA: Diagnosis not present

## 2019-04-15 DIAGNOSIS — Z7982 Long term (current) use of aspirin: Secondary | ICD-10-CM | POA: Insufficient documentation

## 2019-04-15 DIAGNOSIS — E785 Hyperlipidemia, unspecified: Secondary | ICD-10-CM | POA: Insufficient documentation

## 2019-04-15 DIAGNOSIS — Z79899 Other long term (current) drug therapy: Secondary | ICD-10-CM | POA: Insufficient documentation

## 2019-04-15 DIAGNOSIS — Z955 Presence of coronary angioplasty implant and graft: Secondary | ICD-10-CM

## 2019-04-15 HISTORY — PX: CORONARY ATHERECTOMY: CATH118238

## 2019-04-15 HISTORY — PX: CORONARY STENT INTERVENTION: CATH118234

## 2019-04-15 LAB — GLUCOSE, CAPILLARY
Glucose-Capillary: 98 mg/dL (ref 70–99)
Glucose-Capillary: 106 mg/dL — ABNORMAL HIGH (ref 70–99)
Glucose-Capillary: 147 mg/dL — ABNORMAL HIGH (ref 70–99)
Glucose-Capillary: 121 mg/dL — ABNORMAL HIGH (ref 70–99)

## 2019-04-15 LAB — BASIC METABOLIC PANEL
Anion gap: 12 (ref 5–15)
BUN: 10 mg/dL (ref 8–23)
CO2: 23 mmol/L (ref 22–32)
Calcium: 9.4 mg/dL (ref 8.9–10.3)
Chloride: 102 mmol/L (ref 98–111)
Creatinine, Ser: 0.61 mg/dL (ref 0.44–1.00)
GFR calc Af Amer: >60 mL/min (ref >60)
GFR calc non Af Amer: >60 mL/min (ref >60)
Glucose, Bld: 116 mg/dL — ABNORMAL HIGH (ref 70–99)
Potassium: 4.8 mmol/L (ref 3.5–5.1)
Sodium: 137 mmol/L (ref 135–145)

## 2019-04-15 LAB — HEMOGLOBIN A1C
Hgb A1c MFr Bld: 5.7 % — ABNORMAL HIGH (ref 4.8–5.6)
Mean Plasma Glucose: 116.89 mg/dL

## 2019-04-15 LAB — CBC
HCT: 41 % (ref 36.0–46.0)
Hemoglobin: 13.2 g/dL (ref 12.0–15.0)
MCH: 31.4 pg (ref 26.0–34.0)
MCHC: 32.2 g/dL (ref 30.0–36.0)
MCV: 97.4 fL (ref 80.0–100.0)
Platelets: 321 10*3/uL (ref 150–400)
RBC: 4.21 MIL/uL (ref 3.87–5.11)
RDW: 14 % (ref 11.5–15.5)
WBC: 5.3 10*3/uL (ref 4.0–10.5)
nRBC: 0 % (ref 0.0–0.2)

## 2019-04-15 LAB — POCT ACTIVATED CLOTTING TIME: Activated Clotting Time: 175 seconds

## 2019-04-15 SURGERY — CORONARY STENT INTERVENTION
Anesthesia: LOCAL

## 2019-04-15 MED ORDER — SODIUM CHLORIDE 0.9 % IV SOLN: 250.0000 mL | INTRAVENOUS | Status: DC | PRN

## 2019-04-15 MED ORDER — PRAVASTATIN SODIUM 10 MG PO TABS
20.0000 mg | ORAL_TABLET | Freq: Every day | ORAL | Status: DC
  Administered 2019-04-15: 20 mg via ORAL
  Filled 2019-04-15: qty 2

## 2019-04-15 MED ORDER — HYDRALAZINE HCL 20 MG/ML IJ SOLN
INTRAMUSCULAR | Status: AC
  Filled 2019-04-15: qty 1

## 2019-04-15 MED ORDER — POTASSIUM CHLORIDE ER 10 MEQ PO TBCR
20.0000 meq | EXTENDED_RELEASE_TABLET | Freq: Two times a day (BID) | ORAL | Status: DC
  Administered 2019-04-15 – 2019-04-16 (×2): 20 meq via ORAL
  Filled 2019-04-15 (×6): qty 2

## 2019-04-15 MED ORDER — ONDANSETRON HCL 4 MG/2ML IJ SOLN
INTRAMUSCULAR | Status: DC | PRN
  Administered 2019-04-15: 4 mg via INTRAVENOUS

## 2019-04-15 MED ORDER — SODIUM CHLORIDE 0.9% FLUSH: 3.0000 mL | INTRAVENOUS | Status: DC | PRN

## 2019-04-15 MED ORDER — SODIUM CHLORIDE 0.9% FLUSH: 3.0000 mL | Freq: Two times a day (BID) | INTRAVENOUS | Status: DC

## 2019-04-15 MED ORDER — HEPARIN (PORCINE) IN NACL 1000-0.9 UT/500ML-% IV SOLN
INTRAVENOUS | Status: DC | PRN
  Administered 2019-04-15 (×2): 500 mL

## 2019-04-15 MED ORDER — PANTOPRAZOLE SODIUM 40 MG PO TBEC
40.0000 mg | DELAYED_RELEASE_TABLET | Freq: Every day | ORAL | Status: DC
  Administered 2019-04-15 – 2019-04-16 (×2): 40 mg via ORAL
  Filled 2019-04-15 (×2): qty 1

## 2019-04-15 MED ORDER — VERAPAMIL HCL 2.5 MG/ML IV SOLN
INTRAVENOUS | Status: DC | PRN
  Administered 2019-04-15: 10:00:00 10 mL via INTRA_ARTERIAL

## 2019-04-15 MED ORDER — IOHEXOL 350 MG/ML SOLN
INTRAVENOUS | Status: DC | PRN
  Administered 2019-04-15: 12:00:00 110 mL via INTRA_ARTERIAL

## 2019-04-15 MED ORDER — NITROGLYCERIN 1 MG/10 ML FOR IR/CATH LAB
INTRA_ARTERIAL | Status: AC
  Filled 2019-04-15: qty 10

## 2019-04-15 MED ORDER — LIDOCAINE HCL (PF) 1 % IJ SOLN
INTRAMUSCULAR | Status: DC | PRN
  Administered 2019-04-15: 20 mL

## 2019-04-15 MED ORDER — SODIUM CHLORIDE 0.9 % IV SOLN
INTRAVENOUS | Status: AC | PRN
  Administered 2019-04-15: 10 mL/h via INTRAVENOUS

## 2019-04-15 MED ORDER — ONDANSETRON HCL 4 MG/2ML IJ SOLN: 4.0000 mg | Freq: Four times a day (QID) | INTRAMUSCULAR | Status: DC | PRN

## 2019-04-15 MED ORDER — VERAPAMIL HCL 2.5 MG/ML IV SOLN
INTRAVENOUS | Status: AC
  Filled 2019-04-15: qty 2

## 2019-04-15 MED ORDER — SODIUM CHLORIDE 0.9% FLUSH
3.0000 mL | Freq: Two times a day (BID) | INTRAVENOUS | Status: DC
  Administered 2019-04-15: 21:00:00 3 mL via INTRAVENOUS

## 2019-04-15 MED ORDER — ASPIRIN 81 MG PO CHEW: 81.0000 mg | CHEWABLE_TABLET | ORAL | Status: DC

## 2019-04-15 MED ORDER — SODIUM CHLORIDE 0.9 % WEIGHT BASED INFUSION
3.0000 mL/kg/h | INTRAVENOUS | Status: DC
  Administered 2019-04-15: 3 mL/kg/h via INTRAVENOUS

## 2019-04-15 MED ORDER — RISPERIDONE 3 MG PO TABS
3.0000 mg | ORAL_TABLET | Freq: Every day | ORAL | Status: DC
  Administered 2019-04-15: 3 mg via ORAL
  Filled 2019-04-15 (×2): qty 1

## 2019-04-15 MED ORDER — MIDAZOLAM HCL 2 MG/2ML IJ SOLN
INTRAMUSCULAR | Status: DC | PRN
  Administered 2019-04-15 (×3): 1 mg via INTRAVENOUS

## 2019-04-15 MED ORDER — LIDOCAINE HCL (PF) 1 % IJ SOLN
INTRAMUSCULAR | Status: AC
  Filled 2019-04-15: qty 30

## 2019-04-15 MED ORDER — HEPARIN SODIUM (PORCINE) 1000 UNIT/ML IJ SOLN
INTRAMUSCULAR | Status: AC
  Filled 2019-04-15: qty 1

## 2019-04-15 MED ORDER — GABAPENTIN 100 MG PO CAPS
100.0000 mg | ORAL_CAPSULE | Freq: Three times a day (TID) | ORAL | Status: DC
  Administered 2019-04-15 – 2019-04-16 (×3): 100 mg via ORAL
  Filled 2019-04-15 (×3): qty 1

## 2019-04-15 MED ORDER — INSULIN ASPART 100 UNIT/ML ~~LOC~~ SOLN
0.0000 [IU] | Freq: Three times a day (TID) | SUBCUTANEOUS | Status: DC
  Administered 2019-04-15: 2 [IU] via SUBCUTANEOUS

## 2019-04-15 MED ORDER — HEPARIN (PORCINE) IN NACL 1000-0.9 UT/500ML-% IV SOLN
INTRAVENOUS | Status: AC
  Filled 2019-04-15: qty 1000

## 2019-04-15 MED ORDER — HYDRALAZINE HCL 20 MG/ML IJ SOLN
10.0000 mg | INTRAMUSCULAR | Status: AC | PRN
  Administered 2019-04-15: 13:00:00 10 mg via INTRAVENOUS

## 2019-04-15 MED ORDER — ONDANSETRON HCL 4 MG/2ML IJ SOLN
INTRAMUSCULAR | Status: AC
  Filled 2019-04-15: qty 2

## 2019-04-15 MED ORDER — LABETALOL HCL 5 MG/ML IV SOLN: 10.0000 mg | INTRAVENOUS | Status: AC | PRN

## 2019-04-15 MED ORDER — CLOPIDOGREL BISULFATE 75 MG PO TABS: 75.0000 mg | ORAL_TABLET | ORAL | Status: DC

## 2019-04-15 MED ORDER — ALBUTEROL SULFATE (2.5 MG/3ML) 0.083% IN NEBU: 2.5000 mg | INHALATION_SOLUTION | Freq: Two times a day (BID) | RESPIRATORY_TRACT | Status: DC | PRN

## 2019-04-15 MED ORDER — BENZTROPINE MESYLATE 0.5 MG PO TABS
0.5000 mg | ORAL_TABLET | Freq: Two times a day (BID) | ORAL | Status: DC
  Administered 2019-04-15 – 2019-04-16 (×2): 0.5 mg via ORAL
  Filled 2019-04-15 (×3): qty 1

## 2019-04-15 MED ORDER — SODIUM CHLORIDE 0.9 % WEIGHT BASED INFUSION
1.0000 mL/kg/h | INTRAVENOUS | Status: AC
  Administered 2019-04-15: 1 mL/kg/h via INTRAVENOUS

## 2019-04-15 MED ORDER — FENTANYL CITRATE (PF) 100 MCG/2ML IJ SOLN
INTRAMUSCULAR | Status: DC | PRN
  Administered 2019-04-15 (×2): 25 ug via INTRAVENOUS

## 2019-04-15 MED ORDER — VIPERSLIDE LUBRICANT OPTIME: TOPICAL | Status: DC | PRN

## 2019-04-15 MED ORDER — MIDAZOLAM HCL 2 MG/2ML IJ SOLN
INTRAMUSCULAR | Status: AC
  Filled 2019-04-15: qty 2

## 2019-04-15 MED ORDER — ACETAMINOPHEN 325 MG PO TABS
650.0000 mg | ORAL_TABLET | ORAL | Status: DC | PRN
  Administered 2019-04-15: 21:00:00 650 mg via ORAL
  Filled 2019-04-15: qty 2

## 2019-04-15 MED ORDER — CLOPIDOGREL BISULFATE 75 MG PO TABS
75.0000 mg | ORAL_TABLET | Freq: Every day | ORAL | Status: DC
  Administered 2019-04-16: 10:00:00 75 mg via ORAL
  Filled 2019-04-15: qty 1

## 2019-04-15 MED ORDER — LORATADINE 10 MG PO TABS
10.0000 mg | ORAL_TABLET | Freq: Every day | ORAL | Status: DC
  Administered 2019-04-16: 10 mg via ORAL
  Filled 2019-04-15: qty 1

## 2019-04-15 MED ORDER — HEPARIN SODIUM (PORCINE) 1000 UNIT/ML IJ SOLN
INTRAMUSCULAR | Status: DC | PRN
  Administered 2019-04-15 (×2): 2000 [IU] via INTRAVENOUS
  Administered 2019-04-15: 5000 [IU] via INTRAVENOUS

## 2019-04-15 MED ORDER — FENTANYL CITRATE (PF) 100 MCG/2ML IJ SOLN
INTRAMUSCULAR | Status: AC
  Filled 2019-04-15: qty 2

## 2019-04-15 MED ORDER — SODIUM CHLORIDE 0.9 % WEIGHT BASED INFUSION: 1.0000 mL/kg/h | INTRAVENOUS | Status: DC

## 2019-04-15 MED ORDER — IOHEXOL 350 MG/ML SOLN
INTRAVENOUS | Status: AC
  Filled 2019-04-15: qty 1

## 2019-04-15 MED ORDER — NITROGLYCERIN 1 MG/10 ML FOR IR/CATH LAB
INTRA_ARTERIAL | Status: DC | PRN
  Administered 2019-04-15 (×2): 150 ug via INTRACORONARY
  Administered 2019-04-15 (×2): 100 ug via INTRACORONARY
  Administered 2019-04-15: 150 ug via INTRACORONARY

## 2019-04-15 MED ORDER — ASPIRIN EC 81 MG PO TBEC
81.0000 mg | DELAYED_RELEASE_TABLET | Freq: Every day | ORAL | Status: DC
  Administered 2019-04-16: 81 mg via ORAL
  Filled 2019-04-15: qty 1

## 2019-04-15 SURGICAL SUPPLY — 32 items
BALLN SAPPHIRE 2.5X15 (BALLOONS) ×2
BALLN SAPPHIRE ~~LOC~~ 2.75X18 (BALLOONS) ×1 IMPLANT
BALLN SAPPHIRE ~~LOC~~ 3.0X15 (BALLOONS) ×2 IMPLANT
BALLN ~~LOC~~ EMERGE MR 2.5X20 (BALLOONS) ×2
BALLN ~~LOC~~ EMERGE MR 3.25X6 (BALLOONS) ×2
BALLOON SAPPHIRE 2.5X15 (BALLOONS) ×1 IMPLANT
BALLOON ~~LOC~~ EMERGE MR 2.5X20 (BALLOONS) ×1 IMPLANT
BALLOON ~~LOC~~ EMERGE MR 3.25X6 (BALLOONS) IMPLANT
CABLE ADAPT PACING TEMP 12FT (ADAPTER) ×2 IMPLANT
CATH LAUNCHER 6FR AL.75 (CATHETERS) ×2 IMPLANT
CATH S G BIP PACING (CATHETERS) ×2 IMPLANT
COVER DOME SNAP 22 D (MISCELLANEOUS) ×2 IMPLANT
CROWN DIAMONDBACK CLASSIC 1.25 (BURR) ×2 IMPLANT
DEVICE RAD TR BAND REGULAR (VASCULAR PRODUCTS) ×1 IMPLANT
ELECT DEFIB PAD ADLT CADENCE (PAD) ×1 IMPLANT
GLIDESHEATH SLEND SS 6F .021 (SHEATH) ×2 IMPLANT
GUIDEWIRE INQWIRE 1.5J.035X260 (WIRE) ×1 IMPLANT
INQWIRE 1.5J .035X260CM (WIRE) ×2
KIT ENCORE 26 ADVANTAGE (KITS) ×2 IMPLANT
KIT HEART LEFT (KITS) ×2 IMPLANT
LUBRICANT VIPERSLIDE CORONARY (MISCELLANEOUS) ×1 IMPLANT
PACK CARDIAC CATHETERIZATION (CUSTOM PROCEDURE TRAY) ×2 IMPLANT
SHEATH PINNACLE 6F 10CM (SHEATH) ×2 IMPLANT
SHEATH PROBE COVER 6X72 (BAG) ×2 IMPLANT
SLEEVE REPOSITIONING LENGTH 30 (MISCELLANEOUS) ×2 IMPLANT
STENT RESOLUTE ONYX 2.5X26 (Permanent Stent) ×1 IMPLANT
STENT RESOLUTE ONYX 3.0X22 (Permanent Stent) ×1 IMPLANT
STENT RESOLUTE ONYX 3.0X8 (Permanent Stent) ×1 IMPLANT
TRANSDUCER W/STOPCOCK (MISCELLANEOUS) ×2 IMPLANT
TUBING CIL FLEX 10 FLL-RA (TUBING) ×2 IMPLANT
WIRE COUGAR XT STRL 190CM (WIRE) ×2 IMPLANT
WIRE VIPERWIRE COR FLEX .012 (WIRE) ×2 IMPLANT

## 2019-04-15 NOTE — Interval H&P Note (Signed)
Cath Lab Visit (complete for each Cath Lab visit)  Clinical Evaluation Leading to the Procedure:   ACS: No.  Non-ACS:    Anginal Classification: CCS III  Anti-ischemic medical therapy: No Therapy  Non-Invasive Test Results: No non-invasive testing performed  Prior CABG: No previous CABG      History and Physical Interval Note:  04/15/2019 9:01 AM  Katherine [name]  has presented today for surgery, with the diagnosis of cad.  The various methods of treatment have been discussed with the patient and family. After consideration of risks, benefits and other options for treatment, the patient has consented to  Procedure(s): CORONARY STENT INTERVENTION (N/A) CORONARY ATHERECTOMY (N/A) as a surgical intervention.  [name] history has been reviewed, patient examined, no change in status, stable for surgery.  I have reviewed the patient's chart and labs.  Questions were answered to the patient's satisfaction.     [name]

## 2019-04-15 NOTE — Progress Notes (Addendum)
Site area: Right groin a 7 french venous sheath was removed by Alfredos Mitchell [facility]  Site Prior to Removal:  Level 0  Pressure Applied For 20 MINUTES    Bedrest  Beginning at   Manual:   Yes.    Patient Status During Pull:  stable  Post Pull Groin Site:  Level 0  Post Pull Instructions Given:  Yes.    Post Pull Pulses Present:  Yes.    Dressing Applied:  Yes.    Comments:

## 2019-04-15 NOTE — Progress Notes (Signed)
Bedrest started at 1315pm

## 2019-04-15 NOTE — Discharge Summary (Addendum)
Discharge Summary    Patient ID: Katherine [name],  MRN: [id number], DOB/AGE: 1945/10/26 74 y.o.  Admit date: 04/15/2019 Discharge date: 04/16/2019   Primary Care Provider: Coralee Rud Primary Cardiologist: No primary care provider on file.  Discharge Diagnoses    Active Problems:   Coronary artery disease involving native coronary artery of native heart with angina pectoris (HCC)   Allergies Allergies  Allergen Reactions  . Penicillins Other (See Comments)    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 PATIENT HAS HAD A PCN REACTION WITH IMMEDIATE RASH, FACIAL/TONGUE/THROAT SWELLING, SOB, OR LIGHTHEADEDNESS WITH HYPOTENSION:  #  #  #  YES  #  #  #   Has patient had a PCN reaction causing severe rash involving mucus membranes or skin necrosis: Unknown Has patient had a PCN reaction that required hospitalization: Unknown Has patient had a PCN reaction occurring within the last 10 years: Unknown   . Pollen Extract     UNSPECIFIED REACTION   . Coconut Oil Itching    UNSPECIFIED REACTION   . Mellaril [Thioridazine] Itching  . Thioridazine Hcl Itching    Diagnostic Studies/Procedures    Cath: 04/15/19  Successful two-vessel orbital atherectomy and PCI using a 3.0 x 20 mm resolute Onyx DES in the RCA, 3.0 x 8 mm resolute Onyx DES in the proximal circumflex, and 2.5 x 26 mm resolute Onyx DES in the mid circumflex  Recommendation: Continue aspirin and clopidogrel without interruption for at least 6 months.  Continue with plans for TAVR for treatment of severe symptomatic aortic stenosis.  Diagnostic Dominance: Right  Intervention    _____________   History of Present Illness     74 year old female with history of aortic stenosis, coronary artery disease, hypertension, hyperlipidemia, type 2 diabetes mellitus, arthritis, fibromyalgia, anxiety, and dementia who was referred to structural heart team to discuss treatment options for management of severe symptomatic aortic  stenosis and multivessel coronary artery disease.  She was originally referred to the multidisciplinary heart valve clinic last summer at which time she was evaluated by Dr. Excell Seltzer.  Echocardiogram performed at that time revealed moderate aortic stenosis with normal left ventricular systolic function. Diagnostic cardiac catheterization revealed multivessel coronary artery disease with long segment high-grade stenosis of the right coronary artery and 75% stenosis in the left circumflex.  There was nonobstructive disease in the left anterior descending coronary artery.  Mean transvalvular gradient across the aortic valve was measured 25 mmHg at that time, corresponding to aortic valve area calculated 1.22 cm.  Medical therapy was recommended.  The patient was recently seen in follow-up by Dr. Excell Seltzer.  She described increased fatigue with symptoms of exertional shortness of breath and chest discomfort. Follow-up transthoracic echocardiogram revealed significant progression and severity of aortic stenosis.  Left ventricular systolic function remain normal.  CT angiography was performed and surgical consultation requested. She was seen by Dr. Cornelius Moras and deemed a candidate for TAVR. Set up for outpatient cardiac cath.   Hospital Course     Underwent cardiac cath noted above with successful 2v orbital atherectomy and PCI with DES x2 p/mLCx and DES x1 to mRCA. Plan for DAPT with ASA/Plavix for at least 6 months. No complications noted overnight. Morning labs were stable. Worked well with cardiac rehab without recurrent chest pain. With cath findings she was switched from Pravastatin to Crestor 40mg  daily. Scheduled for TAVR 04/22/19.   General: Well developed, well nourished, female appearing in no acute distress. Head: Normocephalic, atraumatic.  Neck: Supple  without bruits, JVD. Lungs:  Resp regular and unlabored, CTA. Heart: RRR, S1, S2, harsh systolic murmur; no rub. Abdomen: Soft, non-tender,  non-distended with normoactive bowel sounds. No hepatomegaly. No rebound/guarding. No obvious abdominal masses. Extremities: No clubbing, cyanosis, edema. Distal pedal pulses are 2+ bilaterally. Right radial/femoral cath site stable without bruising or hematoma Neuro: Alert and oriented X 3. Moves all extremities spontaneously. Psych: Normal affect.  Katherine [name] was seen by Dr. Eldridge Dace and determined stable for discharge home. Follow up in the office has been arranged. Medications are listed below.   _____________  Discharge Vitals Blood pressure 128/78, pulse 68, temperature 98 F (36.7 C), temperature source Oral, resp. rate 12, height 4' 10.5" (1.486 m), weight 64.4 kg, SpO2 97 %.  Filed Weights   04/15/19 0728 04/16/19 0543  Weight: 64 kg 64.4 kg    Labs & Radiologic Studies    CBC Recent Labs    04/15/19 0744 04/16/19 0433  WBC 5.3 9.3  HGB 13.2 12.1  HCT 41.0 36.7  MCV 97.4 95.1  PLT 321 279   Basic Metabolic Panel Recent Labs    62/70/35 0744 04/16/19 0433  NA 137 139  K 4.8 4.0  CL 102 103  CO2 23 23  GLUCOSE 116* 115*  BUN 10 8  CREATININE 0.61 0.53  CALCIUM 9.4 8.8*   Liver Function Tests No results for input(s): AST, ALT, ALKPHOS, BILITOT, PROT, ALBUMIN in the last 72 hours. No results for input(s): LIPASE, AMYLASE in the last 72 hours. Cardiac Enzymes No results for input(s): CKTOTAL, CKMB, CKMBINDEX, TROPONINI in the last 72 hours. BNP Invalid input(s): POCBNP D-Dimer No results for input(s): DDIMER in the last 72 hours. Hemoglobin A1C Recent Labs    04/15/19 1453  HGBA1C 5.7*   Fasting Lipid Panel No results for input(s): CHOL, HDL, LDLCALC, TRIG, CHOLHDL, LDLDIRECT in the last 72 hours. Thyroid Function Tests No results for input(s): TSH, T4TOTAL, T3FREE, THYROIDAB in the last 72 hours.  Invalid input(s): FREET3 _____________  CARDIAC CATHETERIZATION  Result Date: 04/15/2019 Successful two-vessel orbital atherectomy and PCI  using a 3.0 x 20 mm resolute Onyx DES in the RCA, 3.0 x 8 mm resolute Onyx DES in the proximal circumflex, and 2.5 x 26 mm resolute Onyx DES in the mid circumflex Recommendation: Continue aspirin and clopidogrel without interruption for at least 6 months.  Continue with plans for TAVR for treatment of severe symptomatic aortic stenosis.  CT CORONARY MORPH W/CTA COR W/SCORE W/CA W/CM &/OR WO/CM  Addendum Date: 04/03/2019   ADDENDUM REPORT: 04/03/2019 22:21 CLINICAL DATA:  77 -year-old female with severe aortic stenosis being evaluated for a TAVR procedure. EXAM: Cardiac TAVR CT TECHNIQUE: The patient was scanned on a [employer]. A 120 kV retrospective scan was triggered in the descending thoracic aorta at 111 HU's. Gantry rotation speed was 250 msecs and collimation was .6 mm. No beta blockade or nitro were given. The 3D data set was reconstructed in 5% intervals of the R-R cycle. Systolic and diastolic phases were analyzed on a dedicated work station using MPR, MIP and VRT modes. The patient received 80 cc of contrast. FINDINGS: Aortic Root: Aortic valve: Trileaflet Aortic valve calcium score: 1546 Aortic annulus: Diameter: 50mm x 71mm Perimeter: 46mm Area: 456 mm^2 Calcifications: Moderate annular calcifications adjacent to left coronary cusp Coronary height: Min Left - 40mm, Max Left - 32mm; Min Right - 28mm Sinotubular height: Left cusp - 66mm; Right cusp - 44mm; Noncoronary cusp - 48mm LVOT (as  measured 3 mm below the annulus): Diameter: 64mm x 46mm Area: 471 mm^2 Calcifications: Moderate calcifications, located beneath left coronary cusp Aortic sinus width: Left cusp -19mm ; Right cusp - 64mm; Noncoronary cusp - 53mm Sinotubular junction width: 49mm x 62mm Optimum Fluoroscopic Angle for Delivery: LAO 27 CAU 11 Cardiac: Right atrium: Mild enlargement Right ventricle: Mild dilatation Pulmonary arteries: Normal size Pulmonary veins: Normal configuration Left atrium: Mild enlargement Left ventricle:  Normal size Pericardium: Normal thickness Coronary arteries: Calcium score 2164 (99th percentile) IMPRESSION: 1. Trileaflet aortic valve with severe calcifications (calcium score 1546) 2. Aortic annulus measures 12mm x 54mm in diameter with perimeter 37mm and area 456 mm^2. Annular measurements suitable for delivery of a 30mm Edwards-Sapien 3 valve 3. Moderate annular calcifications adjacent to the left coronary cusp, extending into the LVOT 4. Coronary to annulus distance is borderline low from the left main (79mm). Sufficient coronary to annulus distance from the RCA (61mm) 5. Optimum Fluoroscopic Angle for Delivery (centered on RCC): LAO 27 CAU 11 6. Coronary arteries are severely calcified (calcium score 2164, 99th percentile) Electronically Signed   By: Oswaldo Milian MD   On: 04/03/2019 22:21   Result Date: 04/03/2019 EXAM: OVER-READ INTERPRETATION  CT CHEST The following report is an over-read performed by radiologist Dr. Vinnie Langton of [facility], [place] on 04/03/2019. This over-read does not include interpretation of cardiac or coronary anatomy or pathology. The coronary calcium score/coronary CTA interpretation by the cardiologist is attached. COMPARISON:  None. FINDINGS: Extracardiac findings will be described separately under dictation for contemporaneously obtained CTA chest, abdomen and pelvis. IMPRESSION: 1. Please see separate dictation for contemporaneously obtained CTA chest, abdomen and pelvis 04/03/2019 for full description of relevant extracardiac findings. Electronically Signed: By: Vinnie Langton M.D. On: 04/03/2019 13:58   CT ANGIO CHEST AORTA W/CM & OR WO/CM  Result Date: 04/03/2019 CLINICAL DATA:  74 year old female with history of severe aortic stenosis under preprocedural evaluation for potential transcatheter aortic valve replacement (TAVR) procedure. EXAM: CT ANGIOGRAPHY CHEST, ABDOMEN AND PELVIS TECHNIQUE: Multidetector CT imaging through the chest, abdomen and  pelvis was performed using the standard protocol during bolus administration of intravenous contrast. Multiplanar reconstructed images and MIPs were obtained and reviewed to evaluate the vascular anatomy. CONTRAST:  136mL OMNIPAQUE IOHEXOL 350 MG/ML SOLN COMPARISON:  None. FINDINGS: CTA CHEST FINDINGS Cardiovascular: Heart size is normal. There is no significant pericardial fluid, thickening or pericardial calcification. There is aortic atherosclerosis, as well as atherosclerosis of the great vessels of the mediastinum and the coronary arteries, including calcified atherosclerotic plaque in the left main, left anterior descending, left circumflex and right coronary arteries. Severe thickening calcification of the aortic valve. 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 Mediastinum/Lymph Nodes: No pathologically enlarged mediastinal or hilar lymph nodes. Moderate hiatal hernia. No axillary lymphadenopathy. Lungs/Pleura: No suspicious appearing pulmonary nodules or masses are noted. No acute consolidative airspace disease. No pleural effusions. Musculoskeletal/Soft Tissues: Chronic appearing compression fractures of T6 and T11, most severe at T11 where there is complete loss of anterior vertebral body height and an acute kyphotic deformity. Status post right shoulder arthroplasty. There are no aggressive appearing lytic or blastic lesions noted in the visualized portions of the skeleton. CTA ABDOMEN AND PELVIS FINDINGS Hepatobiliary: Subcentimeter low-attenuation lesion in segment 2 of the liver, too small to characterize, but statistically likely to represent a tiny cyst. No other suspicious hepatic lesions. No intra or extrahepatic biliary ductal dilatation. Gallbladder is normal in appearance. Pancreas: No pancreatic mass. No pancreatic ductal  dilatation. No pancreatic or peripancreatic fluid collections or inflammatory changes. Spleen: Unremarkable. Adrenals/Urinary Tract: Subcentimeter  low-attenuation lesions in both kidneys, too small to characterize, but statistically likely to represent tiny cysts. No hydroureteronephrosis. Urinary bladder is normal in appearance. Right adrenal gland is normal. In the lateral limb of the left adrenal gland there is a 6 mm fatty attenuation lesion compatible with a small adrenal myelolipoma (axial image 81 of series 15). Stomach/Bowel: The appearance of the stomach is normal. No pathologic dilatation of small bowel or colon. Numerous colonic diverticulae are noted, without surrounding inflammatory changes to suggest an acute diverticulitis at this time. Normal appendix. Vascular/Lymphatic: Aortic atherosclerosis, without evidence of aneurysm or dissection in the abdominal or pelvic vasculature. Vascular findings and measurements pertinent to potential TAVR procedure, as detailed below. No lymphadenopathy noted in the abdomen or pelvis. Reproductive: Status post hysterectomy. Ovaries are not confidently identified may be surgically absent or atrophic. Other: Small umbilical hernia containing only omental fat. No significant volume of ascites. No pneumoperitoneum. Musculoskeletal: There are no aggressive appearing lytic or blastic lesions noted in the visualized portions of the skeleton. Old healed fractures of the parasymphyseal region of the right pubic bone and left inferior and superior pubic rami. VASCULAR MEASUREMENTS PERTINENT TO TAVR: AORTA: Minimal Aortic Diameter-14 x 12 mm Severity of Aortic Calcification-moderate RIGHT PELVIS: Right Common Iliac Artery - Minimal Diameter-8.3 x 8.0 mm Tortuosity-moderate Calcification-moderate Right External Iliac Artery - Minimal Diameter-7.2 x 6.3 mm Tortuosity-severe Calcification-mild Right Common Femoral Artery - Minimal Diameter-6.7 x 6.4 mm Tortuosity-mild Calcification-moderate LEFT PELVIS: Left Common Iliac Artery - Minimal Diameter-8.3 x 8.0 mm Tortuosity-severe Calcification-mild-to-moderate Left External  Iliac Artery - Minimal Diameter-7.2 x 6.3 mm Tortuosity-moderate Calcification-none Left Common Femoral Artery - Minimal Diameter-6.7 x 6.4 mm Tortuosity-severe Calcification-mild-to-moderate Review of the MIP images confirms the above findings. IMPRESSION: 1. Vascular findings and measurements pertinent to potential TAVR procedure, as detailed above. 2. Severe thickening calcification of the aortic valve, compatible with the reported clinical history of severe aortic stenosis. 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 3. Aortic atherosclerosis, in addition to left main and 3 vessel coronary artery disease. 4. Mild colonic diverticulosis without evidence of acute diverticulitis at this time. 5. Additional incidental findings, as above. Electronically Signed   By: Trudie Reed M.D.   On: 04/03/2019 14:58   CT ANGIO ABDOMEN PELVIS  W &/OR WO CONTRAST  Result Date: 04/03/2019 CLINICAL DATA:  74 year old female with history of severe aortic stenosis under preprocedural evaluation for potential transcatheter aortic valve replacement (TAVR) procedure. EXAM: CT ANGIOGRAPHY CHEST, ABDOMEN AND PELVIS TECHNIQUE: Multidetector CT imaging through the chest, abdomen and pelvis was performed using the standard protocol during bolus administration of intravenous contrast. Multiplanar reconstructed images and MIPs were obtained and reviewed to evaluate the vascular anatomy. CONTRAST:  OMNIPAQUE IOHEXOL 350 MG/ML SOLN COMPARISON:  None. FINDINGS: CTA CHEST FINDINGS Cardiovascular: Heart size is normal. There is no significant pericardial fluid, thickening or pericardial calcification. There is aortic atherosclerosis, as well as atherosclerosis of the great vessels of the mediastinum and the coronary arteries, including calcified atherosclerotic plaque in the left main, left anterior descending, left circumflex and right coronary arteries. Severe thickening calcification of the  aortic valve. 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 Mediastinum/Lymph Nodes: No pathologically enlarged mediastinal or hilar lymph nodes. Moderate hiatal hernia. No axillary lymphadenopathy. Lungs/Pleura: No suspicious appearing pulmonary nodules or masses are noted. No acute consolidative airspace disease. No pleural effusions. Musculoskeletal/Soft Tissues: Chronic  appearing compression fractures of T6 and T11, most severe at T11 where there is complete loss of anterior vertebral body height and an acute kyphotic deformity. Status post right shoulder arthroplasty. There are no aggressive appearing lytic or blastic lesions noted in the visualized portions of the skeleton. CTA ABDOMEN AND PELVIS FINDINGS Hepatobiliary: Subcentimeter low-attenuation lesion in segment 2 of the liver, too small to characterize, but statistically likely to represent a tiny cyst. No other suspicious hepatic lesions. No intra or extrahepatic biliary ductal dilatation. Gallbladder is normal in appearance. Pancreas: No pancreatic mass. No pancreatic ductal dilatation. No pancreatic or peripancreatic fluid collections or inflammatory changes. Spleen: Unremarkable. Adrenals/Urinary Tract: Subcentimeter low-attenuation lesions in both kidneys, too small to characterize, but statistically likely to represent tiny cysts. No hydroureteronephrosis. Urinary bladder is normal in appearance. Right adrenal gland is normal. In the lateral limb of the left adrenal gland there is a 6 mm fatty attenuation lesion compatible with a small adrenal myelolipoma (axial image 81 of series 15). Stomach/Bowel: The appearance of the stomach is normal. No pathologic dilatation of small bowel or colon. Numerous colonic diverticulae are noted, without surrounding inflammatory changes to suggest an acute diverticulitis at this time. Normal appendix. Vascular/Lymphatic: Aortic atherosclerosis, without evidence of aneurysm or dissection in the  abdominal or pelvic vasculature. Vascular findings and measurements pertinent to potential TAVR procedure, as detailed below. No lymphadenopathy noted in the abdomen or pelvis. Reproductive: Status post hysterectomy. Ovaries are not confidently identified may be surgically absent or atrophic. Other: Small umbilical hernia containing only omental fat. No significant volume of ascites. No pneumoperitoneum. Musculoskeletal: There are no aggressive appearing lytic or blastic lesions noted in the visualized portions of the skeleton. Old healed fractures of the parasymphyseal region of the right pubic bone and left inferior and superior pubic rami. VASCULAR MEASUREMENTS PERTINENT TO TAVR: AORTA: Minimal Aortic Diameter-14 x 12 mm Severity of Aortic Calcification-moderate RIGHT PELVIS: Right Common Iliac Artery - Minimal Diameter-8.3 x 8.0 mm Tortuosity-moderate Calcification-moderate Right External Iliac Artery - Minimal Diameter-7.2 x 6.3 mm Tortuosity-severe Calcification-mild Right Common Femoral Artery - Minimal Diameter-6.7 x 6.4 mm Tortuosity-mild Calcification-moderate LEFT PELVIS: Left Common Iliac Artery - Minimal Diameter-8.3 x 8.0 mm Tortuosity-severe Calcification-mild-to-moderate Left External Iliac Artery - Minimal Diameter-7.2 x 6.3 mm Tortuosity-moderate Calcification-none Left Common Femoral Artery - Minimal Diameter-6.7 x 6.4 mm Tortuosity-severe Calcification-mild-to-moderate Review of the MIP images confirms the above findings. IMPRESSION: 1. Vascular findings and measurements pertinent to potential TAVR procedure, as detailed above. 2. Severe thickening calcification of the aortic valve, compatible with the reported clinical history of severe aortic stenosis. 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 3. Aortic atherosclerosis, in addition to left main and 3 vessel coronary artery disease. 4. Mild colonic diverticulosis without evidence of acute  diverticulitis at this time. 5. Additional incidental findings, as above. Electronically Signed   By: Trudie Reed M.D.   On: 04/03/2019 14:58   Disposition   Pt is being discharged home today in good condition.  Follow-up Plans & Appointments    Follow-up Information    TAVR WORK UP Follow up.   Why: Please keep your scheduled follow up appts for your TAVR         Discharge Instructions    Amb Referral to Cardiac Rehabilitation   Complete by: As directed    Diagnosis: Coronary Stents   After initial evaluation and assessments completed: Virtual Based Care may be provided alone or in conjunction with Phase 2 Cardiac Rehab based  on patient barriers.: Yes   Call MD for:  redness, tenderness, or signs of infection (pain, swelling, redness, odor or green/yellow discharge around incision site)   Complete by: As directed    Diet - low sodium heart healthy   Complete by: As directed    Discharge instructions   Complete by: As directed    Radial Site Care Refer to this sheet in the next few weeks. These instructions provide you with information on caring for yourself after your procedure. Your caregiver may also give you more specific instructions. Your treatment has been planned according to current medical practices, but problems sometimes occur. Call your caregiver if you have any problems or questions after your procedure. HOME CARE INSTRUCTIONS You may shower the day after the procedure.Remove the bandage (dressing) and gently wash the site with plain soap and water.Gently pat the site dry.  Do not apply powder or lotion to the site.  Do not submerge the affected site in water for 3 to 5 days.  Inspect the site at least twice daily.  Do not flex or bend the affected arm for 24 hours.  No lifting over 5 pounds (2.3 kg) for 5 days after your procedure.  Do not drive home if you are discharged the same day of the procedure. Have someone else drive you.  You may drive 24 hours  after the procedure unless otherwise instructed by your caregiver.  What to expect: Any bruising will usually fade within 1 to 2 weeks.  Blood that collects in the tissue (hematoma) may be painful to the touch. It should usually decrease in size and tenderness within 1 to 2 weeks.  SEEK IMMEDIATE MEDICAL CARE IF: You have unusual pain at the radial site.  You have redness, warmth, swelling, or pain at the radial site.  You have drainage (other than a small amount of blood on the dressing).  You have chills.  You have a fever or persistent symptoms for more than 72 hours.  You have a fever and your symptoms suddenly get worse.  Your arm becomes pale, cool, tingly, or numb.  You have heavy bleeding from the site. Hold pressure on the site.   Groin Site Care Refer to this sheet in the next few weeks. These instructions provide you with information on caring for yourself after your procedure. Your caregiver may also give you more specific instructions. Your treatment has been planned according to current medical practices, but problems sometimes occur. Call your caregiver if you have any problems or questions after your procedure. HOME CARE INSTRUCTIONS You may shower 24 hours after the procedure. Remove the bandage (dressing) and gently wash the site with plain soap and water. Gently pat the site dry.  Do not apply powder or lotion to the site.  Do not sit in a bathtub, swimming pool, or whirlpool for 5 to 7 days.  No bending, squatting, or lifting anything over 10 pounds (4.5 kg) as directed by your caregiver.  Inspect the site at least twice daily.  Do not drive home if you are discharged the same day of the procedure. Have someone else drive you.  You may drive 24 hours after the procedure unless otherwise instructed by your caregiver.  What to expect: Any bruising will usually fade within 1 to 2 weeks.  Blood that collects in the tissue (hematoma) may be painful to the touch. It should  usually decrease in size and tenderness within 1 to 2 weeks.  SEEK IMMEDIATE MEDICAL  CARE IF: You have unusual pain at the groin site or down the affected leg.  You have redness, warmth, swelling, or pain at the groin site.  You have drainage (other than a small amount of blood on the dressing).  You have chills.  You have a fever or persistent symptoms for more than 72 hours.  You have a fever and your symptoms suddenly get worse.  Your leg becomes pale, cool, tingly, or numb.  You have heavy bleeding from the site. Hold pressure on the site. .   Increase activity slowly   Complete by: As directed        Discharge Medications     Medication List    STOP taking these medications   pravastatin 20 MG tablet Commonly known as: PRAVACHOL     TAKE these medications   albuterol 108 (90 Base) MCG/ACT inhaler Commonly known as: VENTOLIN HFA Inhale 1 puff into the lungs 2 (two) times daily as needed for wheezing or shortness of breath.   ascorbic acid 500 MG tablet Commonly known as: VITAMIN C Take 500 mg by mouth daily.   aspirin EC 81 MG tablet Take 81 mg by mouth daily.   benztropine 0.5 MG tablet Commonly known as: COGENTIN Take 0.5 mg by mouth 2 (two) times daily.   calcium-vitamin D 500-200 MG-UNIT tablet Commonly known as: OSCAL WITH D Take 1 tablet by mouth 3 (three) times daily. What changed: when to take this   cetirizine 10 MG tablet Commonly known as: ZYRTEC TAKE 1 TABLET BY MOUTH DAILY   clindamycin 300 MG capsule Commonly known as: CLEOCIN Take 600 mg by mouth See admin instructions. 1 HOUR PRIOR TO DENTAL APPOINTMENTS   clopidogrel 75 MG tablet Commonly known as: Plavix Take 1 tablet (75 mg total) by mouth daily.   diclofenac sodium 1 % Gel Commonly known as: VOLTAREN Apply 2-4 g topically 4 (four) times daily as needed (Pain).   fluticasone 50 MCG/ACT nasal spray Commonly known as: FLONASE Place 1 spray into both nostrils daily as needed for  allergies.   gabapentin 100 MG capsule Commonly known as: NEURONTIN Take 100 mg by mouth 3 (three) times daily.   hydrOXYzine 10 MG tablet Commonly known as: ATARAX/VISTARIL Take 1 tablet (10 mg total) by mouth 2 (two) times daily as needed. What changed: reasons to take this   ipratropium 0.06 % nasal spray Commonly known as: ATROVENT USE 1 SPRAY IN EACH NOSTRIL THREE TIMES DAILY What changed: See the new instructions.   metFORMIN 500 MG tablet Commonly known as: GLUCOPHAGE Take 500 mg by mouth 2 (two) times daily.   multivitamin with minerals Tabs tablet Take 1 tablet by mouth daily.   OneTouch Delica Lancets 33G Misc CHECK BLOOD SUGAR ONCE DAILY AS DIRECTED   pantoprazole 40 MG tablet Commonly known as: PROTONIX TAKE 1 TABLET BY MOUTH DAILY What changed: when to take this   Potassium Chloride ER 20 MEQ Tbcr Take 20 mEq by mouth 2 (two) times daily.   risperiDONE 3 MG tablet Commonly known as: RISPERDAL Take 3 mg by mouth at bedtime.   rosuvastatin 40 MG tablet Commonly known as: CRESTOR Take 1 tablet (40 mg total) by mouth daily at 6 PM.   Urea 20 Intensive Hydrating 20 % cream Generic drug: urea Apply 1 application topically daily as needed (dry skin). For dry skin       No  Did the patient have a percutaneous coronary intervention (stent / angioplasty)?:  Yes.     Cath/PCI Registry Performance & Quality Measures: 1. Aspirin prescribed? - Yes 2. ADP Receptor Inhibitor (Plavix/Clopidogrel, Brilinta/Ticagrelor or Effient/Prasugrel) prescribed (includes medically managed patients)? - Yes 3. High Intensity Statin (Lipitor 40-80mg  or Crestor 20-40mg ) prescribed? - Yes 4. For EF <40%, was ACEI/ARB prescribed? - Not Applicable (EF >/= 40%) 5. For EF <40%, Aldosterone Antagonist (Spironolactone or Eplerenone) prescribed? - Not Applicable (EF >/= 40%) 6. Cardiac Rehab Phase II ordered (Included Medically managed Patients)? - Yes       Outstanding Labs/Studies   FLP/LFTs in 8 weeks.   Duration of Discharge Encounter   Greater than 30 minutes including physician time.  Signed, Laverda Page NP-C 04/16/2019, 11:31 AM  Patient seen, examined. Available data reviewed. Agree with findings, assessment, and plan as outlined by Laverda Page, NP.  On my exam this morning the patient is alert, oriented, in no distress.  Lungs are clear, heart is regular rate and rhythm with a grade 3/6 harsh systolic murmur at the right upper sternal border.  The right radial site is clear with mild ecchymosis but no hematoma.  The right groin site is clear with minor ecchymosis noted.  I agree with plans as outlined above.  The patient is stable for discharge with plans for TAVR next week.  She has done very well with two-vessel PCI and should continue on dual antiplatelet therapy with aspirin and clopidogrel for at least 6 months.  [name], M.D. 04/16/2019 12:43 PM

## 2019-04-16 DIAGNOSIS — I350 Nonrheumatic aortic (valve) stenosis: Secondary | ICD-10-CM | POA: Diagnosis not present

## 2019-04-16 DIAGNOSIS — I10 Essential (primary) hypertension: Secondary | ICD-10-CM | POA: Diagnosis not present

## 2019-04-16 DIAGNOSIS — E785 Hyperlipidemia, unspecified: Secondary | ICD-10-CM | POA: Diagnosis not present

## 2019-04-16 DIAGNOSIS — I25119 Atherosclerotic heart disease of native coronary artery with unspecified angina pectoris: Secondary | ICD-10-CM | POA: Diagnosis not present

## 2019-04-16 LAB — CBC
HCT: 36.7 % (ref 36.0–46.0)
Hemoglobin: 12.1 g/dL (ref 12.0–15.0)
MCH: 31.3 pg (ref 26.0–34.0)
MCHC: 33 g/dL (ref 30.0–36.0)
MCV: 95.1 fL (ref 80.0–100.0)
Platelets: 279 10*3/uL (ref 150–400)
RBC: 3.86 MIL/uL — ABNORMAL LOW (ref 3.87–5.11)
RDW: 14.2 % (ref 11.5–15.5)
WBC: 9.3 10*3/uL (ref 4.0–10.5)
nRBC: 0 % (ref 0.0–0.2)

## 2019-04-16 LAB — BASIC METABOLIC PANEL
Anion gap: 13 (ref 5–15)
BUN: 8 mg/dL (ref 8–23)
CO2: 23 mmol/L (ref 22–32)
Calcium: 8.8 mg/dL — ABNORMAL LOW (ref 8.9–10.3)
Chloride: 103 mmol/L (ref 98–111)
Creatinine, Ser: 0.53 mg/dL (ref 0.44–1.00)
GFR calc Af Amer: >60 mL/min (ref >60)
GFR calc non Af Amer: >60 mL/min (ref >60)
Glucose, Bld: 115 mg/dL — ABNORMAL HIGH (ref 70–99)
Potassium: 4 mmol/L (ref 3.5–5.1)
Sodium: 139 mmol/L (ref 135–145)

## 2019-04-16 LAB — POCT ACTIVATED CLOTTING TIME
Activated Clotting Time: 246 seconds
Activated Clotting Time: 263 seconds
Activated Clotting Time: 257 seconds

## 2019-04-16 LAB — GLUCOSE, CAPILLARY
Glucose-Capillary: 111 mg/dL — ABNORMAL HIGH (ref 70–99)
Glucose-Capillary: 113 mg/dL — ABNORMAL HIGH (ref 70–99)

## 2019-04-16 MED ORDER — ROSUVASTATIN CALCIUM 20 MG PO TABS: 40.0000 mg | ORAL_TABLET | Freq: Every day | ORAL | Status: DC

## 2019-04-16 MED ORDER — ROSUVASTATIN CALCIUM 40 MG PO TABS: 40.0000 mg | ORAL_TABLET | Freq: Every day | ORAL | 0 refills | Status: DC

## 2019-04-16 NOTE — Evaluation (Signed)
Physical Therapy Evaluation Patient Details Name: Katherine [name] MRN: [id number] DOB: 1945-12-26 Today's Date: 04/16/2019   History of Present Illness  Pt admit with chest issues.  Pt with CAD and work up for TAVR.  To d/c home and come back for surgery next wekk. PMHx: anxiety, depression, DM, fibromyalgia, HTN  Clinical Impression  04/16/2019 PT TAVR Pre-Assessment  Clinical Impression Statement: Pt is a 74 y.o.female being assessed for pre-TAVR.  Pt reports symptoms of dyspnea with activity.  Pt has WFL strength, WFL ROM, and good balance.  Pt ambulated 990 ft during the 6 minute walk test requiring 0 rest breaks with max HR of 115 bpm, lowest O2 sat 94, BP 127/90 during mobility.  5 meter walk test produced an average gait speed of 2.19 which indicates that pt is a limited community ambulator .  RPE was 15 and dyspnea was 3 during mobility.  Pt was limited by fatigue per pt.  Pt's frailty rating was 6 which is considered Moderately frail.  Pt would benefit from continued PT in the acute care setting due to the above listed deficits in balance, strength, ROM, endurance and activity tolerance.    General UE/LE Strength and ROM:  Strength (0-5/5) ROM (limited/full)  R UE 3+/5 full  L UE 3+/5 full  R LE 4-/5 full  L LE 4-/5 full    6 Minute Walk Test:   Total Distance Walked:990 ft.    Did the pt need a rest break? No If yes, why? Pain:; Fatigue:; Dyspnea/O2 saturations:  Comments: Pt reports fatigue at end of walk   Pre-Test Post-Test  BP 149/78 127/90  HR 83 bpm 115 bpm  O2 saturations (indicated RA or L/min [place]) 98% RA 97% RA  Modified Borg Dyspnea Scale (0 none-10 maximal) 2 3  RPE (6 very light-10 very hard) 6 15  Comments: Pt tolerated well overall.    5 Meter Walk Test:  Trial 1 12.4 seconds  Trial 2 10.86 seconds  Trial 3 12.77 seconds  3 Trial Average/Gait Speed 36.03 seconds/16.4 ft/sec= 2.19  Comments: Pt is a limited community ambulator  Clinical Frailty Scale  (1 very fit - 9 terminally ill): 6 (</= 5/12 is considered frail)                 Follow Up Recommendations Supervision - Intermittent;No PT follow up    Equipment Recommendations  None recommended by PT    Recommendations for Other Services       Precautions / Restrictions Precautions Precautions: Fall Restrictions Weight Bearing Restrictions: No      Mobility  Bed Mobility Overal bed mobility: Independent                Transfers Overall transfer level: Needs assistance Equipment used: Rolling walker (2 wheeled) Transfers: Sit to/from Stand Sit to Stand: Min guard         General transfer comment: No assist needed just cues for hand placement  Ambulation/Gait Ambulation/Gait assistance: Min guard Gait Distance (Feet): 990 Feet Assistive device: Rolling walker (2 wheeled) Gait Pattern/deviations: Step-through pattern;Decreased stride length;Trunk flexed;Decreased weight shift to right;Wide base of support   Gait velocity interpretation: 1.31 - 2.62 ft/sec, indicative of limited community ambulator General Gait Details: Pt was able to ambulate with RW on unit with good cadence and good safety.  TAVR assesment complete.   Stairs            Wheelchair Mobility    Modified Rankin (Stroke Patients Only)  Balance Overall balance assessment: Needs assistance Sitting-balance support: No upper extremity supported;Feet supported Sitting balance-Leahy Scale: Fair     Standing balance support: Bilateral upper extremity supported;During functional activity;No upper extremity supported Standing balance-Leahy Scale: Fair Standing balance comment: can stand statically without the RW.  Needs RW for dynamic activity                             Pertinent Vitals/Pain Pain Assessment: No/denies pain    Home Living Family/patient expects to be discharged to:: Private residence Living Arrangements: Alone Available Help at  Discharge: Family;Friend(s);Personal care attendant;Available PRN/intermittently Type of Home: Apartment Home Access: Stairs to enter Entrance Stairs-Rails: None Entrance Stairs-Number of Steps: 1 +1 Home Layout: One level Home Equipment: Walker - 4 wheels;Bedside commode;Cane - single point;Shower seat Additional Comments: Pt has PCA 2.5-3 hours day M-Sat    Prior Function Level of Independence: Needs assistance   Gait / Transfers Assistance Needed: used rollator most of time per pt and SPC at times Modif I.   ADL's / Homemaking Assistance Needed: pt stated having an aide to assist with dressing, bathing, cooking and housework  Comments: pt has aide  and family that assist at her baseline     Hand Dominance   Dominant Hand: Right    Extremity/Trunk Assessment   Upper Extremity Assessment Upper Extremity Assessment: Defer to OT evaluation    Lower Extremity Assessment Lower Extremity Assessment: Generalized weakness    Cervical / Trunk Assessment Cervical / Trunk Assessment: Kyphotic  Communication   Communication: No difficulties  Cognition Arousal/Alertness: Awake/alert Behavior During Therapy: WFL for tasks assessed/performed Overall Cognitive Status: Within Functional Limits for tasks assessed                                        General Comments      Exercises     Assessment/Plan    PT Assessment Patient needs continued PT services  PT Problem List Decreased balance;Decreased activity tolerance;Decreased mobility;Decreased knowledge of use of DME;Decreased safety awareness;Decreased knowledge of precautions;Cardiopulmonary status limiting activity       PT Treatment Interventions DME instruction;Gait training;Functional mobility training;Therapeutic activities;Therapeutic exercise;Balance training;Patient/family education;Stair training    PT Goals (Current goals can be found in the Care Plan section)  Acute Rehab PT Goals Patient  Stated Goal: to go home PT Goal Formulation: With patient Time For Goal Achievement: 04/30/19 Potential to Achieve Goals: Good    Frequency Min 3X/week   Barriers to discharge        Co-evaluation               AM-PAC PT "6 Clicks" Mobility  Outcome Measure Help needed turning from your back to your side while in a flat bed without using bedrails?: None Help needed moving from lying on your back to sitting on the side of a flat bed without using bedrails?: None Help needed moving to and from a bed to a chair (including a wheelchair)?: None Help needed standing up from a chair using your arms (e.g., wheelchair or bedside chair)?: None Help needed to walk in hospital room?: A Little Help needed climbing 3-5 steps with a railing? : A Little 6 Click Score: 22    End of Session Equipment Utilized During Treatment: Gait belt Activity Tolerance: Patient limited by fatigue Patient left: in bed;with call bell/phone within reach;with  bed alarm set Nurse Communication: Mobility status PT Visit Diagnosis: Muscle weakness (generalized) (M62.81)    Time: 1364-3837 PT Time Calculation (min) (ACUTE ONLY): 42 min   Charges:   PT Evaluation $PT Eval Moderate Complexity: 1 Mod PT Treatments $Gait Training: 23-37 mins        Kaylah Chiasson W,PT Acute Rehabilitation Services Pager:  [phone]  Office:  [place] 04/16/2019, 10:28 AM

## 2019-04-16 NOTE — Progress Notes (Signed)
[id number] Did not walk with pt as she just walked with PT to do 6 minute walk test. Education completed with pt who voiced understanding but also asked that I write some of it down. Discussed importance of plavix with stents and gave stent card. Reviewed NTG use (also wrote down how to take per pt's request), gave diabetic and heart healthy diets and encouraged light walking until TAVR next week. No weight lifting or ex equipment. Discussed CRP 2 for after TAVR. Referred to [place] program. Pt stated not good with technology so will not refer for Virtual App. Pt does not drive so she would need transportation to CRP 2. Will follow up and see pt when she returns for TAVR. Luetta Nutting RN BSN 04/16/2019 9:49 AM

## 2019-04-17 NOTE — Progress Notes (Signed)
[facility] #[id number], [address] [place]  [address] Phone: [phone] Fax: [phone]      Your procedure is scheduled on April 22, 2019.  Report to [facility] Main Entrance "A" at 10:15 A.M., and check in at the Admitting office.  Call this number if you have problems the morning of surgery:  [phone]  Call [phone] if you have any questions prior to your surgery date Monday-Friday 8am-4pm    Remember:  Do not eat or drink after midnight the night before your surgery   Take these medicines the morning of surgery with A SIP OF WATER:  NONE  Continue Aspirin and PLAVIX through day before surgery.  Last dose of METFORMIN on 04/19/19.    7 days prior to surgery, STOP taking Aleve, Naproxen, Ibuprofen, Motrin, Advil, Goody's, BC's, all herbal medications, fish oil, and all vitamins.    The Morning of Surgery  Do not wear jewelry, make-up or nail polish.  Do not wear lotions, powders, or perfumes or deodorant  Do not shave 48 hours prior to surgery.    Do not bring valuables to the hospital.  [facility] is not responsible for any belongings or valuables.  If you are a smoker, DO NOT Smoke 24 hours prior to surgery  If you wear a CPAP at night please bring your mask the morning of surgery   Remember that you must have someone to transport you home after your surgery, and remain with you for 24 hours if you are discharged the same day.   Please bring cases for contacts, glasses, hearing aids, dentures or bridgework because it cannot be worn into surgery.    Leave your suitcase in the car.  After surgery it may be brought to your room.  For patients admitted to the hospital, discharge time will be determined by your treatment team.  Patients discharged the day of surgery will not be allowed to drive home.    Special instructions:   [facility]- Preparing For  Surgery  Before surgery, you can play an important role. Because skin is not sterile, your skin needs to be as free of germs as possible. You can reduce the number of germs on your skin by washing with CHG (chlorahexidine gluconate) Soap before surgery.  CHG is an antiseptic cleaner which kills germs and bonds with the skin to continue killing germs even after washing.    Oral Hygiene is also important to reduce your risk of infection.  Remember - BRUSH YOUR TEETH THE MORNING OF SURGERY WITH YOUR REGULAR TOOTHPASTE  Please do not use if you have an allergy to CHG or antibacterial soaps. If your skin becomes reddened/irritated stop using the CHG.  Do not shave (including legs and underarms) for at least 48 hours prior to first CHG shower. It is OK to shave your face.  Please follow these instructions carefully.   1. Shower the NIGHT BEFORE SURGERY and the MORNING OF SURGERY with CHG Soap.   2. If you chose to wash your hair, wash your hair first as usual with your normal shampoo.  3. After you shampoo, rinse your hair and body thoroughly to remove the shampoo.  4. Use CHG as you would any other liquid soap. You can apply CHG directly to the skin and wash gently with a scrungie or a clean washcloth.   5. Apply the CHG Soap to your  body ONLY FROM THE NECK DOWN.  Do not use on open wounds or open sores. Avoid contact with your eyes, ears, mouth and genitals (private parts). Wash Face and genitals (private parts)  with your normal soap.   6. Wash thoroughly, paying special attention to the area where your surgery will be performed.  7. Thoroughly rinse your body with warm water from the neck down.  8. DO NOT shower/wash with your normal soap after using and rinsing off the CHG Soap.  9. Pat yourself dry with a CLEAN TOWEL.  10. Wear CLEAN PAJAMAS to bed the night before surgery, wear comfortable clothes the morning of surgery  11. Place CLEAN SHEETS on your bed the night of your first  shower and DO NOT SLEEP WITH PETS.    Day of Surgery:  Please shower the morning of surgery with the CHG soap Do not apply any deodorants/lotions. Please wear clean clothes to the hospital/surgery center.   Remember to brush your teeth WITH YOUR REGULAR TOOTHPASTE.   Please read over the following fact sheets that you were given.

## 2019-04-18 ENCOUNTER — Encounter (HOSPITAL_COMMUNITY)
Admission: RE | Admit: 2019-04-18 | Discharge: 2019-04-18 | Disposition: A | Discharge status: Home / Self Care | Payer: Medicare Other | Source: Ambulatory Visit | Attending: Cardiovascular Disease | Admitting: Cardiovascular Disease

## 2019-04-18 ENCOUNTER — Other Ambulatory Visit (HOSPITAL_COMMUNITY)
Admit: 2019-04-18 | Discharge: 2019-04-18 | Disposition: A | Discharge status: Home / Self Care | Payer: Medicare Other | Attending: Cardiovascular Disease | Admitting: Cardiovascular Disease

## 2019-04-18 ENCOUNTER — Encounter (HOSPITAL_COMMUNITY): Payer: Self-pay

## 2019-04-18 ENCOUNTER — Other Ambulatory Visit: Payer: Self-pay

## 2019-04-18 ENCOUNTER — Encounter (HOSPITAL_COMMUNITY)
Admit: 2019-04-18 | Discharge: 2019-04-18 | Disposition: A | Discharge status: Home / Self Care | Payer: Medicare Other | Source: Ambulatory Visit | Attending: Cardiovascular Disease | Admitting: Cardiovascular Disease

## 2019-04-18 ENCOUNTER — Telehealth: Payer: Self-pay | Admitting: Cardiovascular Disease

## 2019-04-18 DIAGNOSIS — I350 Nonrheumatic aortic (valve) stenosis: Secondary | ICD-10-CM | POA: Diagnosis not present

## 2019-04-18 DIAGNOSIS — Z01812 Encounter for preprocedural laboratory examination: Secondary | ICD-10-CM | POA: Insufficient documentation

## 2019-04-18 DIAGNOSIS — Z20822 Contact with and (suspected) exposure to covid-19: Secondary | ICD-10-CM | POA: Diagnosis not present

## 2019-04-18 DIAGNOSIS — I700 Atherosclerosis of aorta: Secondary | ICD-10-CM | POA: Insufficient documentation

## 2019-04-18 LAB — COMPREHENSIVE METABOLIC PANEL
ALT: 30 U/L (ref 0–44)
AST: 38 U/L (ref 15–41)
Albumin: 4.1 g/dL (ref 3.5–5.0)
Alkaline Phosphatase: 51 U/L (ref 38–126)
Anion gap: 12 (ref 5–15)
BUN: 11 mg/dL (ref 8–23)
CO2: 21 mmol/L — ABNORMAL LOW (ref 22–32)
Calcium: 9.4 mg/dL (ref 8.9–10.3)
Chloride: 104 mmol/L (ref 98–111)
Creatinine, Ser: 0.59 mg/dL (ref 0.44–1.00)
GFR calc Af Amer: >60 mL/min (ref >60)
GFR calc non Af Amer: >60 mL/min (ref >60)
Glucose, Bld: 112 mg/dL — ABNORMAL HIGH (ref 70–99)
Potassium: 4.4 mmol/L (ref 3.5–5.1)
Sodium: 137 mmol/L (ref 135–145)
Total Bilirubin: 0.6 mg/dL (ref 0.3–1.2)
Total Protein: 6.7 g/dL (ref 6.5–8.1)

## 2019-04-18 LAB — URINALYSIS, ROUTINE W REFLEX MICROSCOPIC
Bilirubin Urine: NEGATIVE
Glucose, UA: NEGATIVE mg/dL
Hgb urine dipstick: NEGATIVE
Ketones, ur: NEGATIVE mg/dL
Leukocytes,Ua: NEGATIVE
Nitrite: NEGATIVE
Protein, ur: NEGATIVE mg/dL
Specific Gravity, Urine: 1.004 — ABNORMAL LOW (ref 1.005–1.030)
pH: 7 (ref 5.0–8.0)

## 2019-04-18 LAB — BLOOD GAS, ARTERIAL
Acid-Base Excess: 0.4 mmol/L (ref 0.0–2.0)
Bicarbonate: 24.1 mmol/L (ref 20.0–28.0)
FIO2: 21
O2 Saturation: 98.1 %
Patient temperature: 37
pCO2 arterial: 35.8 mmHg (ref 32.0–48.0)
pH, Arterial: 7.443 (ref 7.350–7.450)
pO2, Arterial: 130 mmHg — ABNORMAL HIGH (ref 83.0–108.0)

## 2019-04-18 LAB — CBC
HCT: 37.2 % (ref 36.0–46.0)
Hemoglobin: 12.2 g/dL (ref 12.0–15.0)
MCH: 31.5 pg (ref 26.0–34.0)
MCHC: 32.8 g/dL (ref 30.0–36.0)
MCV: 96.1 fL (ref 80.0–100.0)
Platelets: 295 10*3/uL (ref 150–400)
RBC: 3.87 MIL/uL (ref 3.87–5.11)
RDW: 14.1 % (ref 11.5–15.5)
WBC: 7.6 10*3/uL (ref 4.0–10.5)
nRBC: 0 % (ref 0.0–0.2)

## 2019-04-18 LAB — APTT: aPTT: 28 seconds (ref 24–36)

## 2019-04-18 LAB — SURGICAL PCR SCREEN
MRSA, PCR: NEGATIVE
Staphylococcus aureus: NEGATIVE

## 2019-04-18 LAB — GLUCOSE, CAPILLARY: Glucose-Capillary: 101 mg/dL — ABNORMAL HIGH (ref 70–99)

## 2019-04-18 LAB — PROTIME-INR
INR: 1 (ref 0.8–1.2)
Prothrombin Time: 12.9 seconds (ref 11.4–15.2)

## 2019-04-18 LAB — ABO/RH: ABO/RH(D): O POS

## 2019-04-18 LAB — HEMOGLOBIN A1C
Hgb A1c MFr Bld: 5.7 % — ABNORMAL HIGH (ref 4.8–5.6)
Mean Plasma Glucose: 116.89 mg/dL

## 2019-04-18 LAB — SARS CORONAVIRUS 2 (TAT 6-24 HRS): SARS Coronavirus 2: NEGATIVE

## 2019-04-18 LAB — BRAIN NATRIURETIC PEPTIDE: B Natriuretic Peptide: 97.7 pg/mL (ref 0.0–100.0)

## 2019-04-18 NOTE — Progress Notes (Signed)
PCP: [facility] Roxanne Mins PA-C Cardiologist: Dr. Excell Seltzer  EKG: 04/16/19 CXR: Today ECHO: 03/14/19 Stress Test: denies Cardiac Cath: 04/15/19  Fasting Blood Sugar- 78-130's Checks Blood Sugar once daily in evenings  Per MD order, will continue ASA and Plavix until day before surgery.  Patient denies shortness of breath, fever, cough, and chest pain at PAT appointment.  Patient verbalized understanding of instructions provided today at the PAT appointment.  Patient asked to review instructions at home and day of surgery.

## 2019-04-18 NOTE — Telephone Encounter (Signed)
The patient is scheduled for TAVR on 04/22/19. I spoke with Carlean Jews, PA-C who will call patient to discuss her request.

## 2019-04-18 NOTE — Telephone Encounter (Signed)
Talked to the patient and she has the wash.

## 2019-04-18 NOTE — Telephone Encounter (Signed)
Pt states that she has an upcoming heart cath procedure and needed a medication cream to wash with. Please address

## 2019-04-18 NOTE — Progress Notes (Signed)
[facility] DR[facility]ORE #[id number] [place], [place] - [address] AT [facility] OF [address] [place] [address] Phone: [phone] Fax: [phone]      Your procedure is scheduled on April 22, 2019.  Report to [facility] Main Entrance "A" at 10:15 A.M., and check in at the Admitting office.  Call this number if you have problems the morning of surgery:  [phone]  Call [phone] if you have any questions prior to your surgery date Monday-Friday 8am-4pm    Remember:  Do not eat or drink after midnight the night before your surgery  STOP Metformin on 04/20/2019, take last dose on 04/19/2019.   Continue taking all current medications including Aspirin and Clopidogrel (PLAVIX) without change through the day before surgery.   On the morning of surgery do not take any medications.     HOW TO MANAGE YOUR DIABETES BEFORE AND AFTER SURGERY  Why is it important to control my blood sugar before and after surgery? . Improving blood sugar levels before and after surgery helps healing and can limit problems. . A way of improving blood sugar control is eating a healthy diet by: o  Eating less sugar and carbohydrates o  Increasing activity/exercise o  Talking with your doctor about reaching your blood sugar goals . High blood sugars (greater than 180 mg/dL) can raise your risk of infections and slow your recovery, so you will need to focus on controlling your diabetes during the weeks before surgery. . Make sure that the doctor who takes care of your diabetes knows about your planned surgery including the date and location.  How do I manage my blood sugar before surgery? . Check your blood sugar at least 4 times a day, starting 2 days before surgery, to make sure that the level is not too high or low. . Check your blood sugar the morning of your surgery when you wake up and every 2 hours until you get to the Short Stay unit. o If your blood  sugar is less than 70 mg/dL, you will need to treat for low blood sugar: - Do not take insulin. - Treat a low blood sugar (less than 70 mg/dL) with  cup of clear juice (cranberry or apple), 4 glucose tablets, OR glucose gel. - Recheck blood sugar in 15 minutes after treatment (to make sure it is greater than 70 mg/dL). If your blood sugar is not greater than 70 mg/dL on recheck, call [phone] for further instructions. . Report your blood sugar to the short stay nurse when you get to Short Stay.  . If you are admitted to the hospital after surgery: o Your blood sugar will be checked by the staff and you will probably be given insulin after surgery (instead of oral diabetes medicines) to make sure you have good blood sugar levels. o The goal for blood sugar control after surgery is 80-180 mg/dL.      The Morning of Surgery  Do not wear jewelry, make-up or nail polish.  Do not wear lotions, powders, or perfumes or deodorant  Do not shave 48 hours prior to surgery.    Do not bring valuables to the hospital.  [facility] is not responsible for any belongings or valuables.  If you are a smoker, DO NOT Smoke 24 hours prior to surgery  If you wear a CPAP at night please bring your mask the morning of surgery   Remember that you must have  someone to transport you home after your surgery, and remain with you for 24 hours if you are discharged the same day.   Please bring cases for contacts, glasses, hearing aids, dentures or bridgework because it cannot be worn into surgery.    Leave your suitcase in the car.  After surgery it may be brought to your room.  For patients admitted to the hospital, discharge time will be determined by your treatment team.  Patients discharged the day of surgery will not be allowed to drive home.    Special instructions:   [facility]- Preparing For Surgery  Before surgery, you can play an important role. Because skin is not sterile, your skin needs to  be as free of germs as possible. You can reduce the number of germs on your skin by washing with CHG (chlorahexidine gluconate) Soap before surgery.  CHG is an antiseptic cleaner which kills germs and bonds with the skin to continue killing germs even after washing.    Oral Hygiene is also important to reduce your risk of infection.  Remember - BRUSH YOUR TEETH THE MORNING OF SURGERY WITH YOUR REGULAR TOOTHPASTE  Please do not use if you have an allergy to CHG or antibacterial soaps. If your skin becomes reddened/irritated stop using the CHG.  Do not shave (including legs and underarms) for at least 48 hours prior to first CHG shower. It is OK to shave your face.  Please follow these instructions carefully.   1. Shower the NIGHT BEFORE SURGERY and the MORNING OF SURGERY with CHG Soap.   2. If you chose to wash your hair, wash your hair first as usual with your normal shampoo.  3. After you shampoo, rinse your hair and body thoroughly to remove the shampoo.  4. Use CHG as you would any other liquid soap. You can apply CHG directly to the skin and wash gently with a scrungie or a clean washcloth.   5. Apply the CHG Soap to your body ONLY FROM THE NECK DOWN.  Do not use on open wounds or open sores. Avoid contact with your eyes, ears, mouth and genitals (private parts). Wash Face and genitals (private parts)  with your normal soap.   6. Wash thoroughly, paying special attention to the area where your surgery will be performed.  7. Thoroughly rinse your body with warm water from the neck down.  8. DO NOT shower/wash with your normal soap after using and rinsing off the CHG Soap.  9. Pat yourself dry with a CLEAN TOWEL.  10. Wear CLEAN PAJAMAS to bed the night before surgery, wear comfortable clothes the morning of surgery  11. Place CLEAN SHEETS on your bed the night of your first shower and DO NOT SLEEP WITH PETS.    Day of Surgery:  Please shower the morning of surgery with the CHG  soap Do not apply any deodorants/lotions. Please wear clean clothes to the hospital/surgery center.   Remember to brush your teeth WITH YOUR REGULAR TOOTHPASTE.   Please read over the following fact sheets that you were given.

## 2019-04-18 NOTE — Telephone Encounter (Signed)
*  STAT* If patient is at the pharmacy, call can be transferred to refill team.   1. Which medications need to be refilled? (please list name of each medication and dose if known)   UREA 20 INTENSIVE HYDRATING 20 % cream  2. Which pharmacy/location (including street and city if local pharmacy) is medication to be sent to?  [facility] #[id number] - [place], [place] - [address] AT [facility] OF [address]  3. Do they need a 30 day or 90 day supply? 30  Patient asked for a cream to use after her upcoming  Heart cath procedure. She could not remember the name of the medication, but this was the only one on her file.   If this is not the correct medication please disregard the request.

## 2019-04-19 LAB — TYPE AND SCREEN
ABO/RH(D): O POS
Antibody Screen: NEGATIVE

## 2019-04-21 MED ORDER — POTASSIUM CHLORIDE 2 MEQ/ML IV SOLN
80.0000 meq | INTRAVENOUS | Status: DC
  Filled 2019-04-21: qty 40

## 2019-04-21 MED ORDER — VANCOMYCIN HCL 1250 MG/250ML IV SOLN
1250.0000 mg | INTRAVENOUS | Status: AC
  Administered 2019-04-22: 1250 mg via INTRAVENOUS
  Filled 2019-04-21: qty 250

## 2019-04-21 MED ORDER — DEXMEDETOMIDINE HCL IN NACL 400 MCG/100ML IV SOLN
0.1000 ug/kg/h | INTRAVENOUS | Status: AC
  Administered 2019-04-22: 1 ug/kg/h via INTRAVENOUS
  Filled 2019-04-21: qty 100

## 2019-04-21 MED ORDER — SODIUM CHLORIDE 0.9 % IV SOLN
INTRAVENOUS | Status: DC
  Filled 2019-04-21: qty 30

## 2019-04-21 MED ORDER — LEVOFLOXACIN IN D5W 500 MG/100ML IV SOLN
500.0000 mg | INTRAVENOUS | Status: AC
  Administered 2019-04-22: 500 mg via INTRAVENOUS
  Filled 2019-04-21: qty 100

## 2019-04-21 MED ORDER — MAGNESIUM SULFATE 50 % IJ SOLN
40.0000 meq | INTRAMUSCULAR | Status: DC
  Filled 2019-04-21: qty 9.85

## 2019-04-21 MED ORDER — NOREPINEPHRINE 4 MG/250ML-% IV SOLN
0.0000 ug/min | INTRAVENOUS | Status: DC
  Filled 2019-04-21: qty 250

## 2019-04-22 ENCOUNTER — Encounter (HOSPITAL_COMMUNITY): Payer: Self-pay | Admitting: Cardiovascular Disease

## 2019-04-22 ENCOUNTER — Encounter (HOSPITAL_COMMUNITY)
Admission: RE | Disposition: A | Discharge status: Home / Self Care | Payer: Self-pay | Source: Home / Self Care | Attending: Cardiovascular Disease

## 2019-04-22 ENCOUNTER — Other Ambulatory Visit: Payer: Self-pay

## 2019-04-22 ENCOUNTER — Inpatient Hospital Stay (HOSPITAL_COMMUNITY)
Admission: RE | Admit: 2019-04-22 | Discharge: 2019-04-24 | DRG: 267 | Disposition: A | Discharge status: Home / Self Care | Payer: Medicare Other | Attending: Cardiovascular Disease | Admitting: Cardiovascular Disease

## 2019-04-22 ENCOUNTER — Ambulatory Visit (HOSPITAL_COMMUNITY)
Admission: RE | Admit: 2019-04-22 | Discharge: 2019-04-22 | Disposition: A | Discharge status: Home / Self Care | Payer: Medicare Other | Source: Ambulatory Visit | Attending: Cardiovascular Disease | Admitting: Cardiovascular Disease

## 2019-04-22 ENCOUNTER — Other Ambulatory Visit: Payer: Self-pay | Admitting: Physician Assistant

## 2019-04-22 ENCOUNTER — Inpatient Hospital Stay (HOSPITAL_COMMUNITY): Payer: Medicare Other | Admitting: Certified Registered"

## 2019-04-22 ENCOUNTER — Inpatient Hospital Stay (HOSPITAL_COMMUNITY): Payer: Medicare Other | Admitting: Physician Assistant

## 2019-04-22 DIAGNOSIS — Z888 Allergy status to other drugs, medicaments and biological substances status: Secondary | ICD-10-CM

## 2019-04-22 DIAGNOSIS — Z8614 Personal history of Methicillin resistant Staphylococcus aureus infection: Secondary | ICD-10-CM | POA: Diagnosis not present

## 2019-04-22 DIAGNOSIS — I350 Nonrheumatic aortic (valve) stenosis: Secondary | ICD-10-CM | POA: Diagnosis not present

## 2019-04-22 DIAGNOSIS — Z880 Allergy status to penicillin: Secondary | ICD-10-CM

## 2019-04-22 DIAGNOSIS — I10 Essential (primary) hypertension: Secondary | ICD-10-CM | POA: Diagnosis present

## 2019-04-22 DIAGNOSIS — E118 Type 2 diabetes mellitus with unspecified complications: Secondary | ICD-10-CM | POA: Diagnosis present

## 2019-04-22 DIAGNOSIS — F0390 Unspecified dementia without behavioral disturbance: Secondary | ICD-10-CM | POA: Diagnosis present

## 2019-04-22 DIAGNOSIS — Z833 Family history of diabetes mellitus: Secondary | ICD-10-CM | POA: Diagnosis not present

## 2019-04-22 DIAGNOSIS — Z90711 Acquired absence of uterus with remaining cervical stump: Secondary | ICD-10-CM

## 2019-04-22 DIAGNOSIS — I25119 Atherosclerotic heart disease of native coronary artery with unspecified angina pectoris: Secondary | ICD-10-CM | POA: Diagnosis present

## 2019-04-22 DIAGNOSIS — Z006 Encounter for examination for normal comparison and control in clinical research program: Secondary | ICD-10-CM

## 2019-04-22 DIAGNOSIS — M810 Age-related osteoporosis without current pathological fracture: Secondary | ICD-10-CM | POA: Diagnosis present

## 2019-04-22 DIAGNOSIS — K219 Gastro-esophageal reflux disease without esophagitis: Secondary | ICD-10-CM | POA: Diagnosis present

## 2019-04-22 DIAGNOSIS — D62 Acute posthemorrhagic anemia: Secondary | ICD-10-CM | POA: Diagnosis not present

## 2019-04-22 DIAGNOSIS — M1990 Unspecified osteoarthritis, unspecified site: Secondary | ICD-10-CM | POA: Diagnosis present

## 2019-04-22 DIAGNOSIS — Z8249 Family history of ischemic heart disease and other diseases of the circulatory system: Secondary | ICD-10-CM | POA: Diagnosis not present

## 2019-04-22 DIAGNOSIS — Z952 Presence of prosthetic heart valve: Secondary | ICD-10-CM | POA: Diagnosis not present

## 2019-04-22 DIAGNOSIS — Z79899 Other long term (current) drug therapy: Secondary | ICD-10-CM

## 2019-04-22 DIAGNOSIS — D649 Anemia, unspecified: Secondary | ICD-10-CM | POA: Diagnosis present

## 2019-04-22 DIAGNOSIS — E11621 Type 2 diabetes mellitus with foot ulcer: Secondary | ICD-10-CM | POA: Diagnosis present

## 2019-04-22 DIAGNOSIS — Z96652 Presence of left artificial knee joint: Secondary | ICD-10-CM | POA: Diagnosis present

## 2019-04-22 DIAGNOSIS — M797 Fibromyalgia: Secondary | ICD-10-CM | POA: Diagnosis present

## 2019-04-22 DIAGNOSIS — E785 Hyperlipidemia, unspecified: Secondary | ICD-10-CM | POA: Diagnosis present

## 2019-04-22 DIAGNOSIS — Z7984 Long term (current) use of oral hypoglycemic drugs: Secondary | ICD-10-CM

## 2019-04-22 DIAGNOSIS — Z96611 Presence of right artificial shoulder joint: Secondary | ICD-10-CM | POA: Diagnosis present

## 2019-04-22 DIAGNOSIS — Z7982 Long term (current) use of aspirin: Secondary | ICD-10-CM

## 2019-04-22 HISTORY — PX: TRANSCATHETER AORTIC VALVE REPLACEMENT, TRANSFEMORAL: SHX6400

## 2019-04-22 HISTORY — DX: Presence of prosthetic heart valve: Z95.2

## 2019-04-22 HISTORY — PX: TEE WITHOUT CARDIOVERSION: SHX5443

## 2019-04-22 LAB — POCT I-STAT, CHEM 8
BUN: 15 mg/dL (ref 8–23)
BUN: 15 mg/dL (ref 8–23)
BUN: 14 mg/dL (ref 8–23)
Calcium, Ion: 1.2 mmol/L (ref 1.15–1.40)
Calcium, Ion: 1.18 mmol/L (ref 1.15–1.40)
Calcium, Ion: 1.16 mmol/L (ref 1.15–1.40)
Chloride: 104 mmol/L (ref 98–111)
Chloride: 103 mmol/L (ref 98–111)
Chloride: 105 mmol/L (ref 98–111)
Creatinine, Ser: 0.3 mg/dL — ABNORMAL LOW (ref 0.44–1.00)
Creatinine, Ser: 0.4 mg/dL — ABNORMAL LOW (ref 0.44–1.00)
Creatinine, Ser: 0.4 mg/dL — ABNORMAL LOW (ref 0.44–1.00)
Glucose, Bld: 101 mg/dL — ABNORMAL HIGH (ref 70–99)
Glucose, Bld: 120 mg/dL — ABNORMAL HIGH (ref 70–99)
Glucose, Bld: 119 mg/dL — ABNORMAL HIGH (ref 70–99)
HCT: 37 % (ref 36.0–46.0)
HCT: 32 % — ABNORMAL LOW (ref 36.0–46.0)
HCT: 30 % — ABNORMAL LOW (ref 36.0–46.0)
Hemoglobin: 12.6 g/dL (ref 12.0–15.0)
Hemoglobin: 10.9 g/dL — ABNORMAL LOW (ref 12.0–15.0)
Hemoglobin: 10.2 g/dL — ABNORMAL LOW (ref 12.0–15.0)
Potassium: 3.7 mmol/L (ref 3.5–5.1)
Potassium: 3.7 mmol/L (ref 3.5–5.1)
Potassium: 4 mmol/L (ref 3.5–5.1)
Sodium: 140 mmol/L (ref 135–145)
Sodium: 140 mmol/L (ref 135–145)
Sodium: 141 mmol/L (ref 135–145)
TCO2: 29 mmol/L (ref 22–32)
TCO2: 29 mmol/L (ref 22–32)
TCO2: 25 mmol/L (ref 22–32)

## 2019-04-22 LAB — POCT I-STAT 7, (LYTES, BLD GAS, ICA,H+H)
Bicarbonate: 27 mmol/L (ref 20.0–28.0)
Calcium, Ion: 1.21 mmol/L (ref 1.15–1.40)
HCT: 33 % — ABNORMAL LOW (ref 36.0–46.0)
Hemoglobin: 11.2 g/dL — ABNORMAL LOW (ref 12.0–15.0)
O2 Saturation: 100 %
Potassium: 3.8 mmol/L (ref 3.5–5.1)
Sodium: 140 mmol/L (ref 135–145)
TCO2: 29 mmol/L (ref 22–32)
pCO2 arterial: 51.7 mmHg — ABNORMAL HIGH (ref 32.0–48.0)
pH, Arterial: 7.326 — ABNORMAL LOW (ref 7.350–7.450)
pO2, Arterial: 312 mmHg — ABNORMAL HIGH (ref 83.0–108.0)

## 2019-04-22 LAB — GLUCOSE, CAPILLARY: Glucose-Capillary: 97 mg/dL (ref 70–99)

## 2019-04-22 LAB — POCT ACTIVATED CLOTTING TIME
Activated Clotting Time: 125 seconds
Activated Clotting Time: 103 seconds
Activated Clotting Time: 290 seconds

## 2019-04-22 SURGERY — IMPLANTATION, AORTIC VALVE, TRANSCATHETER, FEMORAL APPROACH
Anesthesia: Monitor Anesthesia Care

## 2019-04-22 MED ORDER — LIDOCAINE HCL (PF) 1 % IJ SOLN
INTRAMUSCULAR | Status: AC
  Filled 2019-04-22: qty 30

## 2019-04-22 MED ORDER — DEXAMETHASONE SODIUM PHOSPHATE 10 MG/ML IJ SOLN
INTRAMUSCULAR | Status: DC | PRN
  Administered 2019-04-22: 4 mg via INTRAVENOUS

## 2019-04-22 MED ORDER — SODIUM CHLORIDE 0.9% FLUSH
3.0000 mL | Freq: Two times a day (BID) | INTRAVENOUS | Status: DC
  Administered 2019-04-22 – 2019-04-24 (×4): 3 mL via INTRAVENOUS

## 2019-04-22 MED ORDER — GABAPENTIN 100 MG PO CAPS
100.0000 mg | ORAL_CAPSULE | Freq: Three times a day (TID) | ORAL | Status: DC
  Administered 2019-04-22 – 2019-04-24 (×6): 100 mg via ORAL
  Filled 2019-04-22 (×6): qty 1

## 2019-04-22 MED ORDER — HEPARIN (PORCINE) IN NACL 1000-0.9 UT/500ML-% IV SOLN
INTRAVENOUS | Status: AC
  Filled 2019-04-22: qty 1500

## 2019-04-22 MED ORDER — ASPIRIN EC 81 MG PO TBEC
81.0000 mg | DELAYED_RELEASE_TABLET | Freq: Every day | ORAL | Status: DC
  Administered 2019-04-22 – 2019-04-24 (×3): 81 mg via ORAL
  Filled 2019-04-22 (×3): qty 1

## 2019-04-22 MED ORDER — MORPHINE SULFATE (PF) 10 MG/ML IV SOLN: 1.0000 mg | INTRAVENOUS | Status: DC | PRN

## 2019-04-22 MED ORDER — CHLORHEXIDINE GLUCONATE 0.12 % MT SOLN
OROMUCOSAL | Status: AC
  Administered 2019-04-22: 15 mL via OROMUCOSAL
  Filled 2019-04-22: qty 15

## 2019-04-22 MED ORDER — MORPHINE SULFATE (PF) 2 MG/ML IV SOLN: 1.0000 mg | INTRAVENOUS | Status: DC | PRN

## 2019-04-22 MED ORDER — PANTOPRAZOLE SODIUM 40 MG PO TBEC
40.0000 mg | DELAYED_RELEASE_TABLET | Freq: Two times a day (BID) | ORAL | Status: DC
  Administered 2019-04-22 – 2019-04-24 (×4): 40 mg via ORAL
  Filled 2019-04-22 (×4): qty 1

## 2019-04-22 MED ORDER — VANCOMYCIN HCL IN DEXTROSE 1-5 GM/200ML-% IV SOLN
1000.0000 mg | Freq: Once | INTRAVENOUS | Status: AC
  Administered 2019-04-23: 1000 mg via INTRAVENOUS
  Filled 2019-04-22: qty 200

## 2019-04-22 MED ORDER — HEPARIN SODIUM (PORCINE) 1000 UNIT/ML IJ SOLN
INTRAMUSCULAR | Status: DC | PRN
  Administered 2019-04-22: 10000 [IU] via INTRAVENOUS

## 2019-04-22 MED ORDER — CLOPIDOGREL BISULFATE 75 MG PO TABS
75.0000 mg | ORAL_TABLET | Freq: Every day | ORAL | Status: DC
  Administered 2019-04-23 – 2019-04-24 (×2): 75 mg via ORAL
  Filled 2019-04-22 (×2): qty 1

## 2019-04-22 MED ORDER — ACETAMINOPHEN 650 MG RE SUPP: 650.0000 mg | Freq: Four times a day (QID) | RECTAL | Status: DC | PRN

## 2019-04-22 MED ORDER — IOHEXOL 350 MG/ML SOLN
INTRAVENOUS | Status: AC
  Filled 2019-04-22: qty 1

## 2019-04-22 MED ORDER — PROTAMINE SULFATE 10 MG/ML IV SOLN
INTRAVENOUS | Status: DC | PRN
  Administered 2019-04-22: 20 mg via INTRAVENOUS
  Administered 2019-04-22: 40 mg via INTRAVENOUS
  Administered 2019-04-22 (×2): 20 mg via INTRAVENOUS

## 2019-04-22 MED ORDER — ACETAMINOPHEN 325 MG PO TABS: 650.0000 mg | ORAL_TABLET | Freq: Four times a day (QID) | ORAL | Status: DC | PRN

## 2019-04-22 MED ORDER — LIDOCAINE HCL (PF) 1 % IJ SOLN
INTRAMUSCULAR | Status: DC | PRN
  Administered 2019-04-22: 20 mL

## 2019-04-22 MED ORDER — SODIUM CHLORIDE 0.9 % IV SOLN: 250.0000 mL | INTRAVENOUS | Status: DC | PRN

## 2019-04-22 MED ORDER — IPRATROPIUM BROMIDE 0.06 % NA SOLN
1.0000 | Freq: Three times a day (TID) | NASAL | Status: DC
  Administered 2019-04-22 – 2019-04-24 (×5): 1 via NASAL
  Filled 2019-04-22: qty 15

## 2019-04-22 MED ORDER — IOHEXOL 350 MG/ML SOLN
INTRAVENOUS | Status: DC | PRN
  Administered 2019-04-22: 60 mL via INTRA_ARTERIAL

## 2019-04-22 MED ORDER — CHLORHEXIDINE GLUCONATE 0.12 % MT SOLN: 15.0000 mL | Freq: Once | OROMUCOSAL | Status: AC

## 2019-04-22 MED ORDER — NITROGLYCERIN IN D5W 200-5 MCG/ML-% IV SOLN: 0.0000 ug/min | INTRAVENOUS | Status: DC

## 2019-04-22 MED ORDER — SODIUM CHLORIDE 0.9 % IV SOLN: INTRAVENOUS | Status: DC

## 2019-04-22 MED ORDER — RISPERIDONE 3 MG PO TABS
3.0000 mg | ORAL_TABLET | Freq: Every day | ORAL | Status: DC
  Administered 2019-04-22 – 2019-04-23 (×2): 3 mg via ORAL
  Filled 2019-04-22 (×2): qty 1

## 2019-04-22 MED ORDER — PROPOFOL 500 MG/50ML IV EMUL
INTRAVENOUS | Status: DC | PRN
  Administered 2019-04-22: 50 ug/kg/min via INTRAVENOUS

## 2019-04-22 MED ORDER — CHLORHEXIDINE GLUCONATE 4 % EX LIQD: 60.0000 mL | Freq: Once | CUTANEOUS | Status: DC

## 2019-04-22 MED ORDER — TRAMADOL HCL 50 MG PO TABS
50.0000 mg | ORAL_TABLET | ORAL | Status: DC | PRN
  Administered 2019-04-22: 100 mg via ORAL
  Filled 2019-04-22: qty 2

## 2019-04-22 MED ORDER — ROSUVASTATIN CALCIUM 20 MG PO TABS
40.0000 mg | ORAL_TABLET | Freq: Every day | ORAL | Status: DC
  Administered 2019-04-22 – 2019-04-23 (×2): 40 mg via ORAL
  Filled 2019-04-22 (×2): qty 2

## 2019-04-22 MED ORDER — DEXMEDETOMIDINE HCL 200 MCG/2ML IV SOLN
INTRAVENOUS | Status: DC | PRN
  Administered 2019-04-22: 64 ug via INTRAVENOUS

## 2019-04-22 MED ORDER — EPHEDRINE SULFATE-NACL 50-0.9 MG/10ML-% IV SOSY
PREFILLED_SYRINGE | INTRAVENOUS | Status: DC | PRN
  Administered 2019-04-22: 5 mg via INTRAVENOUS
  Administered 2019-04-22: 10 mg via INTRAVENOUS

## 2019-04-22 MED ORDER — OXYCODONE HCL 5 MG PO TABS: 5.0000 mg | ORAL_TABLET | ORAL | Status: DC | PRN

## 2019-04-22 MED ORDER — SODIUM CHLORIDE 0.9 % IV SOLN: INTRAVENOUS | Status: AC

## 2019-04-22 MED ORDER — LACTATED RINGERS IV SOLN: INTRAVENOUS | Status: DC | PRN

## 2019-04-22 MED ORDER — LORATADINE 10 MG PO TABS
10.0000 mg | ORAL_TABLET | Freq: Every day | ORAL | Status: DC
  Administered 2019-04-22 – 2019-04-24 (×3): 10 mg via ORAL
  Filled 2019-04-22 (×3): qty 1

## 2019-04-22 MED ORDER — ONDANSETRON HCL 4 MG/2ML IJ SOLN
4.0000 mg | Freq: Four times a day (QID) | INTRAMUSCULAR | Status: DC | PRN
  Administered 2019-04-22: 4 mg via INTRAVENOUS
  Filled 2019-04-22: qty 2

## 2019-04-22 MED ORDER — PHENYLEPHRINE HCL-NACL 20-0.9 MG/250ML-% IV SOLN
0.0000 ug/min | INTRAVENOUS | Status: DC
  Filled 2019-04-22: qty 250

## 2019-04-22 MED ORDER — SODIUM CHLORIDE 0.9% FLUSH: 3.0000 mL | INTRAVENOUS | Status: DC | PRN

## 2019-04-22 MED ORDER — LEVOFLOXACIN IN D5W 750 MG/150ML IV SOLN
750.0000 mg | INTRAVENOUS | Status: AC
  Administered 2019-04-23: 750 mg via INTRAVENOUS
  Filled 2019-04-22: qty 150

## 2019-04-22 MED ORDER — ONDANSETRON HCL 4 MG/2ML IJ SOLN
INTRAMUSCULAR | Status: DC | PRN
  Administered 2019-04-22: 4 mg via INTRAVENOUS

## 2019-04-22 MED ORDER — BENZTROPINE MESYLATE 0.5 MG PO TABS
0.5000 mg | ORAL_TABLET | Freq: Two times a day (BID) | ORAL | Status: DC
  Administered 2019-04-22 – 2019-04-24 (×4): 0.5 mg via ORAL
  Filled 2019-04-22 (×4): qty 1

## 2019-04-22 MED ORDER — CHLORHEXIDINE GLUCONATE 4 % EX LIQD: 30.0000 mL | CUTANEOUS | Status: DC

## 2019-04-22 MED ORDER — ACETAMINOPHEN 500 MG PO TABS
500.0000 mg | ORAL_TABLET | Freq: Four times a day (QID) | ORAL | Status: DC | PRN
  Administered 2019-04-22 – 2019-04-23 (×3): 500 mg via ORAL
  Filled 2019-04-22 (×3): qty 1

## 2019-04-22 MED ORDER — HEPARIN (PORCINE) IN NACL 1000-0.9 UT/500ML-% IV SOLN
INTRAVENOUS | Status: DC | PRN
  Administered 2019-04-22 (×3): 500 mL

## 2019-04-22 SURGICAL SUPPLY — 32 items
BAG SNAP BAND KOVER 36X36 (MISCELLANEOUS) ×4 IMPLANT
BLANKET WARM UNDERBOD FULL ACC (MISCELLANEOUS) ×2 IMPLANT
CABLE ADAPT PACING TEMP 12FT (ADAPTER) ×1 IMPLANT
CATH 26 ULTRA DELIVERY (CATHETERS) ×1 IMPLANT
CATH DIAG 6FR PIGTAIL ANGLED (CATHETERS) ×2 IMPLANT
CATH INFINITI 6F AL1 (CATHETERS) ×1 IMPLANT
CATH S G BIP PACING (CATHETERS) ×1 IMPLANT
CLOSURE MYNX CONTROL 6F/7F (Vascular Products) ×1 IMPLANT
CRIMPER (MISCELLANEOUS) ×1 IMPLANT
DEVICE CLOSURE PERCLS PRGLD 6F (VASCULAR PRODUCTS) IMPLANT
DEVICE INFLATION ATRION QL2530 (MISCELLANEOUS) ×1 IMPLANT
ELECT DEFIB PAD ADLT CADENCE (PAD) ×1 IMPLANT
GUIDEWIRE SAFE TJ AMPLATZ EXST (WIRE) ×1 IMPLANT
KIT HEART LEFT (KITS) ×2 IMPLANT
KIT MICROPUNCTURE NIT STIFF (SHEATH) ×1 IMPLANT
PACK CARDIAC CATHETERIZATION (CUSTOM PROCEDURE TRAY) ×2 IMPLANT
PERCLOSE PROGLIDE 6F (VASCULAR PRODUCTS) ×4
SHEATH 14X36 EDWARDS (SHEATH) ×1 IMPLANT
SHEATH BRITE TIP 7FR 35CM (SHEATH) ×1 IMPLANT
SHEATH PINNACLE 6F 10CM (SHEATH) ×1 IMPLANT
SHEATH PINNACLE 8F 10CM (SHEATH) ×1 IMPLANT
SHEATH PROBE COVER 6X72 (BAG) ×1 IMPLANT
SHIELD RADPAD SCOOP 12X17 (MISCELLANEOUS) ×1 IMPLANT
SLEEVE REPOSITIONING LENGTH 30 (MISCELLANEOUS) ×1 IMPLANT
STOPCOCK MORSE 400PSI 3WAY (MISCELLANEOUS) ×4 IMPLANT
SYR MEDRAD MARK V 150ML (SYRINGE) ×1 IMPLANT
TRANSDUCER W/STOPCOCK (MISCELLANEOUS) ×4 IMPLANT
VALVE 26 ULTRA SAPIEN KIT (Valve) ×1 IMPLANT
WIRE AMPLATZ SS-J .035X180CM (WIRE) ×1 IMPLANT
WIRE EMERALD 3MM-J .035X150CM (WIRE) ×1 IMPLANT
WIRE EMERALD 3MM-J .035X260CM (WIRE) ×1 IMPLANT
WIRE EMERALD ST .035X260CM (WIRE) ×1 IMPLANT

## 2019-04-22 NOTE — Anesthesia Procedure Notes (Signed)
Arterial Line Insertion Start/End3/23/2021 12:00 PM, 04/22/2019 12:15 PM Performed by: Shireen Quan, CRNA, CRNA  Patient location: Pre-op. Preanesthetic checklist: patient identified, IV checked, site marked, risks and benefits discussed, surgical consent, monitors and equipment checked, pre-op evaluation, timeout performed and anesthesia consent Lidocaine 1% used for infiltration Right, radial was placed Catheter size: 20 G Hand hygiene performed  and maximum sterile barriers used   Attempts: 2 Procedure performed without using ultrasound guided technique. Following insertion, dressing applied and Biopatch. Post procedure assessment: normal  Patient tolerated the procedure well with no immediate complications.

## 2019-04-22 NOTE — Progress Notes (Signed)
Pt received from PACU. VSS. CHG complete. Bilateral groin level 1 but no active hematoma. Pt instructed on bedrest. Call light in reach. Will continue to monitor.  Versie Starks, RN

## 2019-04-22 NOTE — Anesthesia Postprocedure Evaluation (Signed)
Anesthesia Post Note  Patient: Katherine [name]  Procedure(s) Performed: TRANSCATHETER AORTIC VALVE REPLACEMENT, TRANSFEMORAL (N/A ) TRANSESOPHAGEAL ECHOCARDIOGRAM (TEE) (N/A )     Patient location during evaluation: Cath Lab Anesthesia Type: MAC Level of consciousness: awake and alert Pain management: pain level controlled Vital Signs Assessment: post-procedure vital signs reviewed and stable Respiratory status: spontaneous breathing, nonlabored ventilation, respiratory function stable and patient connected to nasal cannula oxygen Cardiovascular status: stable and blood pressure returned to baseline Postop Assessment: no apparent nausea or vomiting Anesthetic complications: no    Last Vitals:  Vitals:   04/22/19 1535 04/22/19 1550  BP: 108/62 108/64  Pulse: 63 65  Resp: 19 (!) 25  Temp:    SpO2: 97% 93%    Last Pain:  Vitals:   04/22/19 1535  TempSrc:   PainSc: 0-No pain                 Ahmia Colford COKER

## 2019-04-22 NOTE — Op Note (Signed)
[facility]   MULTIDISCIPLINARY HEART VALVE TEAM   TAVR OPERATIVE NOTE   Date of Procedure:  04/22/2019  Preoperative Diagnosis: Severe Aortic Stenosis   Postoperative Diagnosis: Same   Procedure:    Transcatheter Aortic Valve Replacement - Percutaneous  Transfemoral Approach  Edwards Sapien 3 Ultra THV (size 26 mm, model # 9750TFX, serial # [id number])   Co-Surgeons:  Valentina Gu. Roxy Manns, MD and Sherren Mocha, MD  Anesthesiologist:  Roberts Gaudy, MD  Echocardiographer:  Ena Dawley, MD  Pre-operative Echo Findings:  Severe aortic stenosis  Normal left ventricular systolic function  Post-operative Echo Findings:  No paravalvular leak  Normal/unchanged left ventricular systolic function  BRIEF CLINICAL NOTE AND INDICATIONS FOR SURGERY  Katherine [name] with severe aortic stenosis, coronary artery disease with angina, type 2 diabetes, and hypertension, presents today for TAVR.  She has developed progressive symptoms of fatigue, shortness of breath, and chest discomfort.  Noninvasive assessment is demonstrated findings consistent with severe aortic stenosis.  She was found to have severe obstructive coronary disease and was treated with atherectomy and PCI of both the RCA and left circumflex vessels last week.  She presents today for TAVR after undergoing multidisciplinary evaluation.  During the course of the patient's preoperative work up they have been evaluated comprehensively by a multidisciplinary team of specialists coordinated through the [facility] in the [facility].  They have been demonstrated to suffer from symptomatic severe aortic stenosis as noted above. The patient has been counseled extensively as to the relative risks and benefits of all options for the treatment of severe aortic stenosis including long term medical therapy, conventional surgery for aortic valve replacement, and transcatheter  aortic valve replacement.  The patient has been independently evaluated in formal cardiac surgical consultation by Dr Roxy Manns, who deemed the patient appropriate for TAVR. Based upon review of all of the patient's preoperative diagnostic tests they are felt to be candidate for transcatheter aortic valve replacement using the transfemoral approach as an alternative to conventional surgery.    Following the decision to proceed with transcatheter aortic valve replacement, a discussion has been held regarding what types of management strategies would be attempted intraoperatively in the event of life-threatening complications, including whether or not the patient would be considered a candidate for the use of cardiopulmonary bypass and/or conversion to open sternotomy for attempted surgical intervention.  The patient has been advised of a variety of complications that might develop peculiar to this approach including but not limited to risks of death, stroke, paravalvular leak, aortic dissection or other major vascular complications, aortic annulus rupture, device embolization, cardiac rupture or perforation, acute myocardial infarction, arrhythmia, heart block or bradycardia requiring permanent pacemaker placement, congestive heart failure, respiratory failure, renal failure, pneumonia, infection, other late complications related to structural valve deterioration or migration, or other complications that might ultimately cause a temporary or permanent loss of functional independence or other long term morbidity.  The patient provides full informed consent for the procedure as described and all questions were answered preoperatively.  DETAILS OF THE OPERATIVE PROCEDURE  PREPARATION:   The patient is brought to the operating room on the above mentioned date and central monitoring was established by the anesthesia team including placement of a central venous catheter and radial arterial line. The patient is placed in  the supine position on the operating table.  Intravenous antibiotics are administered. The patient is monitored closely throughout the procedure under conscious sedation.  Baseline transthoracic  echocardiogram is performed. [name] chest, abdomen, both groins, and both lower extremities are prepared and draped in a sterile manner. A time out procedure is performed.   PERIPHERAL ACCESS:   Using ultrasound guidance, femoral arterial and venous access is obtained with placement of 6 Fr sheaths on the left side.  A pigtail diagnostic catheter was passed through the femoral arterial sheath under fluoroscopic guidance into the aortic root.  A temporary transvenous pacemaker catheter was passed through the femoral venous sheath under fluoroscopic guidance into the right ventricle.  The pacemaker was tested to ensure stable lead placement and pacemaker capture. Aortic root angiography was performed in order to determine the optimal angiographic angle for valve deployment.  TRANSFEMORAL ACCESS:  A micropuncture technique is used to access the right femoral artery under fluoroscopic and ultrasound guidance.  2 Perclose devices are deployed at 10' and 2' positions to 'PreClose' the femoral artery. An 8 French sheath is placed and then an Amplatz Superstiff wire is advanced through the sheath. This is changed out for a 14 French transfemoral E-Sheath after progressively dilating over the Superstiff wire.  An AL-1 catheter was used to direct a straight-tip exchange length wire across the native aortic valve into the left ventricle. This was exchanged out for a pigtail catheter and position was confirmed in the LV apex. Simultaneous LV and Ao pressures were recorded.  The pigtail catheter was exchanged for an Amplatz Extra-stiff wire in the LV apex.    BALLOON AORTIC VALVULOPLASTY:  Not performed  TRANSCATHETER HEART VALVE DEPLOYMENT:  An Edwards Sapien 3 transcatheter heart valve (size 26 mm) was prepared  and crimped per manufacturer's guidelines, and the proper orientation of the valve is confirmed on the [employer] delivery system. The valve was advanced through the introducer sheath using normal technique until in an appropriate position in the abdominal aorta beyond the sheath tip. The balloon was then retracted and using the fine-tuning wheel was centered on the valve. The valve was then advanced across the aortic arch using appropriate flexion of the catheter. The valve was carefully positioned across the aortic valve annulus. The Commander catheter was retracted using normal technique. Once final position of the valve has been confirmed by angiographic assessment, the valve is deployed while temporarily holding ventilation and during rapid ventricular pacing to maintain systolic blood pressure < 50 mmHg and pulse pressure < 10 mmHg. The balloon inflation is held for >3 seconds after reaching full deployment volume. Once the balloon has fully deflated the balloon is retracted into the ascending aorta and valve function is assessed using echocardiography. [name] hemodynamic recovery following valve deployment is good.  The deployment balloon and guidewire are both removed. Echo demostrated acceptable post-procedural gradients, stable mitral valve function, and no aortic insufficiency.    PROCEDURE COMPLETION:  The sheath was removed and femoral artery closure is performed using the 2 previously deployed Perclose devices.  Protamine is administered once femoral arterial repair was complete. The site is clear with no evidence of bleeding or hematoma after the sutures are tightened. The temporary pacemaker and pigtail catheters are removed. Mynx closure is used for contralateral femoral arterial hemostasis for the 6 Fr sheath.  The patient tolerated the procedure well and is transported to the surgical intensive care in stable condition. There were no immediate intraoperative complications. All  sponge instrument and needle counts are verified correct at completion of the operation.   The patient received a total of 60 mL of intravenous contrast during the  procedure.   [name], MD 04/22/2019 2:45 PM

## 2019-04-22 NOTE — Progress Notes (Signed)
  Echocardiogram 2D Echocardiogram has been performed.  Tye Savoy 04/22/2019, 2:08 PM

## 2019-04-22 NOTE — Interval H&P Note (Signed)
History and Physical Interval Note:  04/22/2019 12:50 PM  Katherine [name]  has presented today for surgery, with the diagnosis of Severe Aortic Stenosis.  The various methods of treatment have been discussed with the patient and family. After consideration of risks, benefits and other options for treatment, the patient has consented to  Procedure(s): TRANSCATHETER AORTIC VALVE REPLACEMENT, TRANSFEMORAL (N/A) TRANSESOPHAGEAL ECHOCARDIOGRAM (TEE) (N/A) as a surgical intervention.  [name] history has been reviewed, patient examined, no change in status, stable for surgery.  I have reviewed the patient's chart and labs.  Questions were answered to the patient's satisfaction.    The patient reports no chest pain since her PCI procedure last week.Her exertional dyspnea is unchanged with New York Heart Association functional class II limitation.  [name]

## 2019-04-22 NOTE — Op Note (Signed)
[facility]   MULTIDISCIPLINARY HEART VALVE TEAM   TAVR OPERATIVE NOTE   Date of Procedure:  04/22/2019  Preoperative Diagnosis: Severe Aortic Stenosis   Postoperative Diagnosis: Same   Procedure:    Transcatheter Aortic Valve Replacement - Percutaneous Right Transfemoral Approach  Edwards Sapien 3 Ultra THV (size 26 mm, model # 9750TFX, serial # [id number])   Co-Surgeons:  Salvatore Decent. Cornelius Moras, MD and [name], MD  Anesthesiologist:  Kipp Brood, MD  Echocardiographer:  Tobias Alexander, MD  Pre-operative Echo Findings:  Severe aortic stenosis  Normal left ventricular systolic function  Post-operative Echo Findings:  No paravalvular leak  Normal left ventricular systolic function   BRIEF CLINICAL NOTE AND INDICATIONS FOR SURGERY  Patient is 74 year old female with history of aortic stenosis, coronary artery disease, hypertension, hyperlipidemia, type 2 diabetes mellitus, arthritis, fibromyalgia, anxiety, and dementia who has been referred for surgical consultation to discuss treatment options for management of severe symptomatic aortic stenosis and multivessel coronary artery disease.  Patient states she has known of presence of a heart murmur for many years.  She has been followed by Roxanne Mins for many years with multiple medical problems.  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  Echocardiograms have documented the presence of aortic stenosis that has gradually progressed in severity.  She was originally referred to the multidisciplinary heart valve clinic last summer at which time she was evaluated by Dr. Excell Seltzer.  Echocardiogram performed at that time revealed moderate aortic stenosis with normal left ventricular systolic function.  Diagnostic cardiac catheterization revealed multivessel coronary artery disease with long segment high-grade stenosis of the right coronary artery and 75% stenosis in the left circumflex.   There was nonobstructive disease in the left anterior descending coronary artery.  Mean transvalvular gradient across the aortic valve was measured 25 mmHg at that time, corresponding to aortic valve area calculated 1.22 cm.  Medical therapy was recommended.  The patient was recently seen in follow-up by Dr. Excell Seltzer.  She describes increased fatigue with symptoms of exertional shortness of breath and chest discomfort.  Follow-up transthoracic echocardiogram revealed significant progression and severity of aortic stenosis.  Left ventricular systolic function remain normal.  CT angiography was performed and surgical consultation requested.  During the course of the patient's preoperative work up they have been evaluated comprehensively by a multidisciplinary team of specialists coordinated through the [facility] in the [facility].  They have been demonstrated to suffer from symptomatic severe aortic stenosis as noted above. The patient has been counseled extensively as to the relative risks and benefits of all options for the treatment of severe aortic stenosis including long term medical therapy, conventional surgery for aortic valve replacement, and transcatheter aortic valve replacement.  All questions have been answered, and the patient provides full informed consent for the operation as described.   DETAILS OF THE OPERATIVE PROCEDURE  PREPARATION:    The patient is brought to the operating room on the above mentioned date and appropriate monitoring was established by the anesthesia team. The patient is placed in the supine position on the operating table.  Intravenous antibiotics are administered. The patient is monitored closely throughout the procedure under conscious sedation.  Baseline transthoracic echocardiogram was performed. [name] chest, abdomen, both groins, and both lower extremities are prepared and draped in a sterile manner. A  time out procedure is performed.   PERIPHERAL ACCESS:    Using the modified Seldinger technique,  femoral arterial and venous access was obtained with placement of 6 Fr sheaths on the left side.  A pigtail diagnostic catheter was passed through the left arterial sheath under fluoroscopic guidance into the aortic root.  A temporary transvenous pacemaker catheter was passed through the left femoral venous sheath under fluoroscopic guidance into the right ventricle.  The pacemaker was tested to ensure stable lead placement and pacemaker capture. Aortic root angiography was performed in order to determine the optimal angiographic angle for valve deployment.   TRANSFEMORAL ACCESS:   Percutaneous transfemoral access and sheath placement was performed using ultrasound guidance.  The right common femoral artery was cannulated using a micropuncture needle and appropriate location was verified using hand injection angiogram.  A pair of Abbott Perclose percutaneous closure devices were placed and a 6 French sheath replaced into the femoral artery.  The patient was heparinized systemically and ACT verified > 250 seconds.    A 14 Fr transfemoral E-sheath was introduced into the right common femoral artery after progressively dilating over an Amplatz superstiff wire. An AL-1 catheter was used to direct a straight-tip exchange length wire across the native aortic valve into the left ventricle. This was exchanged out for a pigtail catheter and position was confirmed in the LV apex. Simultaneous LV and Ao pressures were recorded.  The pigtail catheter was exchanged for an Amplatz Extra-stiff wire in the LV apex.  Echocardiography was utilized to confirm appropriate wire position and no sign of entanglement in the mitral subvalvular apparatus.   TRANSCATHETER HEART VALVE DEPLOYMENT:   An Edwards Sapien 3 Ultra transcatheter heart valve (size 26 mm, model #9750TFX, serial #[id number]) was prepared and crimped per  manufacturer's guidelines, and the proper orientation of the valve is confirmed on the [employer] delivery system. The valve was advanced through the introducer sheath using normal technique until in an appropriate position in the abdominal aorta beyond the sheath tip. The balloon was then retracted and using the fine-tuning wheel was centered on the valve. The valve was then advanced across the aortic arch using appropriate flexion of the catheter. The valve was carefully positioned across the aortic valve annulus. The Commander catheter was retracted using normal technique. Once final position of the valve has been confirmed by angiographic assessment, the valve is deployed while temporarily holding ventilation and during rapid ventricular pacing to maintain systolic blood pressure < 50 mmHg and pulse pressure < 10 mmHg. The balloon inflation is held for >3 seconds after reaching full deployment volume. Once the balloon has fully deflated the balloon is retracted into the ascending aorta and valve function is assessed using echocardiography. There is felt to be no paravalvular leak and no central aortic insufficiency.  [name] hemodynamic recovery following valve deployment is good.  The deployment balloon and guidewire are both removed.    PROCEDURE COMPLETION:   The sheath was removed and femoral artery closure performed.  Protamine was administered once femoral arterial repair was complete. The temporary pacemaker, pigtail catheters and femoral sheaths were removed with manual pressure used for hemostasis.  A Mynx femoral closure device was utilized following removal of the diagnostic sheath in the left femoral artery.  The patient tolerated the procedure well and is transported to the surgical intensive care in stable condition. There were no immediate intraoperative complications. All sponge instrument and needle counts are verified correct at completion of the operation.   No blood  products were administered during the operation.  The patient received a total of 60  mL of intravenous contrast during the procedure.   Purcell Nails, MD 04/22/2019 2:06 PM

## 2019-04-22 NOTE — Progress Notes (Signed)
  [facility]   MULTIDISCIPLINARY HEART VALVE TEAM  Patient doing well s/p TAVR. She is hemodynamically stable. Groin sites stable. Left groin with moderate hematoma but soft. ECG with sinus brady but no high grade block. Arterial line to be discontinued and transfer to 4E. Plan for early ambulation after bedrest completed and hopeful discharge over the next 24-48 hours.   Cline Crock PA-C  [facility]  Pager [phone]

## 2019-04-22 NOTE — Transfer of Care (Signed)
Immediate Anesthesia Transfer of Care Note  Patient: Katherine [name]  Procedure(s) Performed: TRANSCATHETER AORTIC VALVE REPLACEMENT, TRANSFEMORAL (N/A ) TRANSESOPHAGEAL ECHOCARDIOGRAM (TEE) (N/A )  Patient Location: Cath Lab  Anesthesia Type:MAC  Level of Consciousness: awake, alert  and oriented  Airway & Oxygen Therapy: Patient Spontanous Breathing and Patient connected to face mask oxygen  Post-op Assessment: Report given to RN and Post -op Vital signs reviewed and stable  Post vital signs: Reviewed and stable  Last Vitals:  Vitals Value Taken Time  BP    Temp    Pulse 60 04/22/19 1432  Resp 17 04/22/19 1432  SpO2 98 % 04/22/19 1432  Vitals shown include unvalidated device data.  Last Pain:  Vitals:   04/22/19 1028  TempSrc:   PainSc: 1          Complications: No apparent anesthesia complications

## 2019-04-22 NOTE — Discharge Instructions (Signed)
ACTIVITY AND EXERCISE °• Daily activity and exercise are an important part of your recovery. People recover at different rates depending on their general health and type of valve procedure. °• Most people recovering from TAVR feel better relatively quickly  °• No lifting, pushing, pulling more than 10 pounds (examples to avoid: groceries, vacuuming, gardening, golfing): °            - For one week with a procedure through the groin. °            - For six weeks for procedures through the chest wall or neck. °NOTE: You will typically see one of our providers 7-14 days after your procedure to discuss WHEN TO RESUME the above activities.  °  °  °DRIVING °• Do not drive until you are seen for follow up and cleared by a provider. Generally, we ask patient to not drive for 1 week after their procedure. °• If you have been told by your doctor in the past that you may not drive, you must talk with him/her before you begin driving again. °  °DRESSING °• Groin site: you may leave the clear dressing over the site for up to one week or until it falls off. °  °HYGIENE °• If you had a femoral (leg) procedure, you may take a shower when you return home. After the shower, pat the site dry. Do NOT use powder, oils or lotions in your groin area until the site has completely healed. °• If you had a chest procedure, you may shower when you return home unless specifically instructed not to by your discharging practitioner. °            - DO NOT scrub incision; pat dry with a towel. °            - DO NOT apply any lotions, oils, powders to the incision. °            - No tub baths / swimming for at least 2 weeks. °• If you notice any fevers, chills, increased pain, swelling, bleeding or pus, please contact your doctor. °  °ADDITIONAL INFORMATION °• If you are going to have an upcoming dental procedure, please contact our office as you will require antibiotics ahead of time to prevent infection on your heart valve.  ° ° °If you have any  questions or concerns you can call the structural heart phone during normal business hours 8am-4pm. If you have an urgent need after hours or weekends please call [phone] to talk to the on call provider for general cardiology. If you have an emergency that requires immediate attention, please call 911.  ° ° °After TAVR Checklist ° °Check  Test Description  ° Follow up appointment in 1-2 weeks  You will see our structural heart physician assistant, Katie Jamaya Sleeth. Your incision sites will be checked and you will be cleared to drive and resume all normal activities if you are doing well.    ° 1 month echo and follow up  You will have an echo to check on your new heart valve and be seen back in the office by Katie Maikayla Beggs. Many times the echo is not read by your appointment time, but Katie will call you later that day or the following day to report your results.  ° Follow up with your primary cardiologist You will need to be seen by your primary cardiologist in the following 3-6 months after your 1 month appointment in the valve   clinic. Often times your Plavix or Aspirin will be discontinued during this time, but this is decided on a case by case basis.   ° 1 year echo and follow up You will have another echo to check on your heart valve after 1 year and be seen back in the office by Katie Alfonso Carden. This your last structural heart visit.  ° Bacterial endocarditis prophylaxis  You will have to take antibiotics for the rest of your life before all dental procedures (even teeth cleanings) to protect your heart valve. Antibiotics are also required before some surgeries. Please check with your cardiologist before scheduling any surgeries. Also, please make sure to tell us if you have a penicillin allergy as you will require an alternative antibiotic.   ° ° °

## 2019-04-22 NOTE — Anesthesia Procedure Notes (Signed)
Procedure Name: MAC Date/Time: 04/22/2019 1:20 PM Performed by: Teressa Lower., CRNA Pre-anesthesia Checklist: Patient identified, Emergency Drugs available, Suction available, Patient being monitored and Timeout performed Patient Re-evaluated:Patient Re-evaluated prior to induction Oxygen Delivery Method: Simple face mask Ventilation: Oral airway inserted - appropriate to patient size

## 2019-04-22 NOTE — Anesthesia Preprocedure Evaluation (Addendum)
Anesthesia Evaluation  Patient identified by MRN, date of birth, ID band Patient awake    Reviewed: Allergy & Precautions, NPO status , Patient's Chart, lab work & pertinent test results  Airway Mallampati: II  TM Distance: >3 FB Neck ROM: Full    Dental  (+) Teeth Intact, Dental Advisory Given   Pulmonary    breath sounds clear to auscultation       Cardiovascular hypertension,  Rhythm:Regular Rate:Normal + Systolic murmurs    Neuro/Psych    GI/Hepatic   Endo/Other  diabetes  Renal/GU      Musculoskeletal   Abdominal (+) + obese,   Peds  Hematology   Anesthesia Other Findings   Reproductive/Obstetrics                            Anesthesia Physical Anesthesia Plan  ASA: III  Anesthesia Plan: MAC   Post-op Pain Management:    Induction: Intravenous  PONV Risk Score and Plan: Ondansetron and Dexamethasone  Airway Management Planned: Natural Airway and Simple Face Mask  Additional Equipment: Arterial line  Intra-op Plan:   Post-operative Plan:   Informed Consent: I have reviewed the patients History and Physical, chart, labs and discussed the procedure including the risks, benefits and alternatives for the proposed anesthesia with the patient or authorized representative who has indicated his/her understanding and acceptance.     Dental advisory given  Plan Discussed with: CRNA and Anesthesiologist  Anesthesia Plan Comments:         Anesthesia Quick Evaluation

## 2019-04-23 ENCOUNTER — Inpatient Hospital Stay (HOSPITAL_COMMUNITY): Payer: Medicare Other

## 2019-04-23 DIAGNOSIS — Z952 Presence of prosthetic heart valve: Secondary | ICD-10-CM

## 2019-04-23 LAB — CBC
HCT: 29.4 % — ABNORMAL LOW (ref 36.0–46.0)
Hemoglobin: 9.6 g/dL — ABNORMAL LOW (ref 12.0–15.0)
MCH: 31.6 pg (ref 26.0–34.0)
MCHC: 32.7 g/dL (ref 30.0–36.0)
MCV: 96.7 fL (ref 80.0–100.0)
Platelets: 273 10*3/uL (ref 150–400)
RBC: 3.04 MIL/uL — ABNORMAL LOW (ref 3.87–5.11)
RDW: 13.9 % (ref 11.5–15.5)
WBC: 11.5 10*3/uL — ABNORMAL HIGH (ref 4.0–10.5)
nRBC: 0 % (ref 0.0–0.2)

## 2019-04-23 LAB — BASIC METABOLIC PANEL
Anion gap: 8 (ref 5–15)
BUN: 19 mg/dL (ref 8–23)
CO2: 24 mmol/L (ref 22–32)
Calcium: 8.7 mg/dL — ABNORMAL LOW (ref 8.9–10.3)
Chloride: 105 mmol/L (ref 98–111)
Creatinine, Ser: 0.53 mg/dL (ref 0.44–1.00)
GFR calc Af Amer: >60 mL/min (ref >60)
GFR calc non Af Amer: >60 mL/min (ref >60)
Glucose, Bld: 146 mg/dL — ABNORMAL HIGH (ref 70–99)
Potassium: 4.1 mmol/L (ref 3.5–5.1)
Sodium: 137 mmol/L (ref 135–145)

## 2019-04-23 LAB — ECHOCARDIOGRAM LIMITED
Height: 58.5 in
Weight: 2328.06 oz

## 2019-04-23 LAB — GLUCOSE, CAPILLARY
Glucose-Capillary: 106 mg/dL — ABNORMAL HIGH (ref 70–99)
Glucose-Capillary: 135 mg/dL — ABNORMAL HIGH (ref 70–99)

## 2019-04-23 LAB — MAGNESIUM: Magnesium: 2 mg/dL (ref 1.7–2.4)

## 2019-04-23 MED ORDER — INSULIN ASPART 100 UNIT/ML ~~LOC~~ SOLN: 0.0000 [IU] | Freq: Three times a day (TID) | SUBCUTANEOUS | Status: DC

## 2019-04-23 NOTE — Progress Notes (Signed)
CARDIAC REHAB PHASE I   PRE:  Rate/Rhythm: 75 SR  BP:  Supine: 118/65  Sitting:   Standing:    SaO2: 96%RA  MODE:  Ambulation: 470 ft   POST:  Rate/Rhythm: 107 ST  BP:  Supine:   Sitting: 137/75  Standing:    SaO2: 100%RA 0920-1000 Pt walked 470 ft on RA with rolling walker and asst x 1. Has rollator at home if needed and cane. Encouraged pt to walk in house at home until stronger as she stated it is hilly outside. Will update CRP 2 [place] to let them know pt has had her TAVR. Pt has no transportation for program as she discussed with me last week. To recliner with call bell. Delightful lady.   Luetta Nutting, RN BSN  04/23/2019 9:51 AM

## 2019-04-23 NOTE — Progress Notes (Signed)
  Echocardiogram 2D Echocardiogram limited post TAVR has been performed.  Leta Jungling M 04/23/2019, 8:45 AM

## 2019-04-23 NOTE — Progress Notes (Addendum)
[place] VALVE TEAM  Patient Name: Katherine [name] Date of Encounter: 04/23/2019  Primary Cardiologist: Roque Cash / Dr. Burt Knack & Dr. Roxy Manns (TAVR)  Hospital Problem List     Principal Problem:   S/P TAVR (transcatheter aortic valve replacement) Active Problems:   Hyperlipidemia   Essential hypertension   GERD   Fibromyalgia   Osteoporosis   Severe aortic stenosis   Normocytic anemia   Diabetic foot ulcer ([place])   Type 2 diabetes mellitus with complication (HCC)     Subjective   No complaints. Very thankful   Inpatient Medications    Scheduled Meds: . aspirin EC  81 mg Oral Daily  . benztropine  0.5 mg Oral BID  . clopidogrel  75 mg Oral Daily  . gabapentin  100 mg Oral TID  . ipratropium  1 spray Each Nare TID  . loratadine  10 mg Oral Daily  . pantoprazole  40 mg Oral BID  . risperiDONE  3 mg Oral QHS  . rosuvastatin  40 mg Oral q1800  . sodium chloride flush  3 mL Intravenous Q12H   Continuous Infusions: . sodium chloride    . levofloxacin (LEVAQUIN) IV     PRN Meds: sodium chloride, acetaminophen **OR** acetaminophen, ondansetron (ZOFRAN) IV, oxyCODONE, sodium chloride flush, traMADol   Vital Signs    Vitals:   04/23/19 0205 04/23/19 0335 04/23/19 0405 04/23/19 0500  BP: 107/66 92/77 105/61   Pulse: 68 64 66   Resp: (!) 9 13 12    Temp:  98.1 F (36.7 C)    TempSrc:  Oral    SpO2: 98% 96% 99%   Weight:    66 kg  Height:        Intake/Output Summary (Last 24 hours) at 04/23/2019 1150 Last data filed at 04/23/2019 0700 Gross per 24 hour  Intake 2932.18 ml  Output 515 ml  Net 2417.18 ml   Filed Weights   04/22/19 0954 04/23/19 0500  Weight: 64 kg 66 kg    Physical Exam   GEN: Well nourished, well developed, in no acute distress.  HEENT: Grossly normal.  Neck: Supple, no JVD, carotid bruits, or masses. Cardiac: RRR, no murmurs, rubs, or gallops. No clubbing, cyanosis, edema.   Respiratory:   Respirations regular and unlabored, clear to auscultation bilaterally. GI: Soft, nontender, nondistended, BS + x 4. MS: no deformity or atrophy. Skin: warm and dry, no rash. Left groin with significant ecchymosis but soft. Right groin with some mild ecchymosis Neuro:  Strength and sensation are intact. Psych: AAOx3.  Normal affect.  Labs    CBC Recent Labs    04/22/19 1440 04/23/19 0308  WBC  --  11.5*  HGB 10.2* 9.6*  HCT 30.0* 29.4*  MCV  --  96.7  PLT  --  283   Basic Metabolic Panel Recent Labs    04/22/19 1440 04/23/19 0308  NA 141 137  K 4.0 4.1  CL 105 105  CO2  --  24  GLUCOSE 119* 146*  BUN 14 19  CREATININE 0.40* 0.53  CALCIUM  --  8.7*  MG  --  2.0   Liver Function Tests No results for input(s): AST, ALT, ALKPHOS, BILITOT, PROT, ALBUMIN in the last 72 hours. No results for input(s): LIPASE, AMYLASE in the last 72 hours. Cardiac Enzymes No results for input(s): CKTOTAL, CKMB, CKMBINDEX, TROPONINI in the last 72 hours. BNP Invalid input(s): POCBNP D-Dimer No results for input(s): DDIMER in the  last 72 hours. Hemoglobin A1C No results for input(s): HGBA1C in the last 72 hours. Fasting Lipid Panel No results for input(s): CHOL, HDL, LDLCALC, TRIG, CHOLHDL, LDLDIRECT in the last 72 hours. Thyroid Function Tests No results for input(s): TSH, T4TOTAL, T3FREE, THYROIDAB in the last 72 hours.  Invalid input(s): FREET3  Telemetry    sinus - Personally Reviewed  ECG    Sinus HR 63 - Personally Reviewed  Radiology    ECHOCARDIOGRAM LIMITED  Result Date: 04/22/2019    ECHOCARDIOGRAM LIMITED REPORT   Patient Name:   Katherine [name] Date of Exam: 04/22/2019 Medical Rec #:  [id number]         Height:       58.5 in Accession #:    [id number]        Weight:       141.0 lb Date of Birth:  Aug 20, 1945          BSA:          1.580 m Patient Age:    73 years          BP:           172/87 mmHg Patient Gender: F                 HR:           71 bpm. Exam Location:   Anesthesiology Procedure: Limited Echo, Echo Assisted Procedure, Limited Color Doppler and            Cardiac Doppler Indications:     Aortic Stenosis 135.0; TAVR Procedure  History:         Patient has prior history of Echocardiogram examinations, most                  recent 03/15/2019. CAD; Risk Factors:Hypertension, Dyslipidemia                  and Diabetes.  Sonographer:     Thurman Coyer RDCS (AE) Referring Phys:  [id number] Gunter Conde Diagnosing Phys: Tobias Alexander MD IMPRESSIONS  1. This was a periprocedural TTE during a TAVR procedure via transfemoral access. A 26 mm Edwards-SAPIEN 3 valve was successfully deployed in the aortic position with improvement of transaortic gradients from peak/mean 41/26 mmHg to 6/3 mmHg. No paravalvular leak.  2. Left ventricular ejection fraction, by estimation, is 60 to 65%. The left ventricle has normal function. There is mild concentric left ventricular hypertrophy. Left ventricular diastolic function could not be evaluated.  3. Mild mitral valve regurgitation.  4. The aortic valve is tricuspid. Aortic valve regurgitation is mild. Severe aortic valve stenosis. FINDINGS  Left Ventricle: Left ventricular ejection fraction, by estimation, is 60 to 65%. The left ventricle has normal function. There is mild concentric left ventricular hypertrophy. Mitral Valve: There is moderate thickening of the mitral valve leaflet(s). There is mild calcification of the mitral valve leaflet(s). Moderate mitral annular calcification. Mild mitral valve regurgitation. Tricuspid Valve: Tricuspid valve regurgitation is mild. Aortic Valve: The aortic valve is tricuspid. . There is severe thickening and severe calcifcation of the aortic valve. Aortic valve regurgitation is mild. Severe aortic stenosis is present. There is severe thickening of the aortic valve. There is severe calcifcation of the aortic valve. Aortic valve mean gradient measures 26.0 mmHg. Aortic valve peak gradient measures 19.8  mmHg. Aortic valve area, by VTI measures 1.10 cm. There is a 26 mm Edwards Sapien prosthetic, stented (TAVR) valve present in the aortic position. Pulmonic Valve: Pulmonic  valve regurgitation is mild.  LEFT VENTRICLE PLAX 2D LVOT diam:     2.00 cm LV SV:         67 LV SV Index:   42 LVOT Area:     3.14 cm  AORTIC VALVE AV Area (Vmax):    1.04 cm AV Area (Vmean):   1.00 cm AV Area (VTI):     1.10 cm AV Vmax:           222.50 cm/s AV Vmean:          165.400 cm/s AV VTI:            0.606 m AV Peak Grad:      19.8 mmHg AV Mean Grad:      26.0 mmHg LVOT Vmax:         73.45 cm/s LVOT Vmean:        52.650 cm/s LVOT VTI:          0.213 m LVOT/AV VTI ratio: 0.35  SHUNTS Systemic VTI:  0.21 m Systemic Diam: 2.00 cm Tobias Alexander MD Electronically signed by Tobias Alexander MD Signature Date/Time: 04/22/2019/3:29:44 PM    Final    Structural Heart Procedure  Result Date: 04/22/2019 See surgical note for result.   Cardiac Studies    TAVR OPERATIVE NOTE   Date of Procedure:                04/22/2019  Preoperative Diagnosis:      Severe Aortic Stenosis   Postoperative Diagnosis:    Same   Procedure:        Transcatheter Aortic Valve Replacement - Percutaneous  Transfemoral Approach             Edwards Sapien 3 Ultra THV (size 26 mm, model # 9750TFX, serial # [id number])              Co-Surgeons:                        Salvatore Decent. Cornelius Moras, MD and [name], MD  Anesthesiologist:                  Kipp Brood, MD  Echocardiographer:              Tobias Alexander, MD  Pre-operative Echo Findings: ? Severe aortic stenosis ? Normal left ventricular systolic function  Post-operative Echo Findings: ? No paravalvular leak ? Normal/unchanged left ventricular systolic function  _________________   Echo 04/23/19: complete but pending formal read at the time of discharge    Patient Profile     Katherine [name] is a 74 y.o. female with a history of CAD, GERD, TIA (12/2018), remote  atrial fibrillation (not on OAC) and recent monitor with no definite afib, history of PE, hypothyroidism, and severe aortic stenosis who presented to [facility] on 04/22/19 for planned TAVR.   Assessment & Plan    Severe AS: s/p successful TAVR with a 26 mm Edwards Sapien 3 THV via the TF approach on 04/22/19. Post operative echo completed but pending formal read. Groin sites are stable (left with significant ecchymosis but soft). ECG with sinus and no high grade heart block. Continue Asprin and plavix.   CAD: pre TAVR cath showed severe 2 vessel CAD with severe stenosis of the proximal circumflex and mid-RCA. She underwent successful two-vessel orbital atherectomy and PCI using a 3.0 x 20 mm resolute Onyx DES in the RCA, 3.0 x 8 mm resolute Onyx DES in  the proximal circumflex, and 2.5 x 26 mm resolute Onyx DES in the mid circumflex. Continue DAPT with aspirin and plavix. Continue statin  DMT2: continue SSI while admitted.   Post op anemia: Hg dropped from 12.6--> 9.6. Groin sites are stable. Continue to monitor  Signed, Cline Crock, PA-C  04/23/2019, 11:50 AM  Pager [phone]  Patient seen, examined. Available data reviewed. Agree with findings, assessment, and plan as outlined by Carlean Jews. Pt seen independently morning of 3/24 and doing well. On exam, she has a 2/6 SEM at the RUSB, lungs CTA, abd soft, NT. BL groin ecchymosis without firm hematoma. Progressing well - likely DC 3/25.  [name], M.D. 04/24/2019 5:54 AM

## 2019-04-24 LAB — CBC
HCT: 27.5 % — ABNORMAL LOW (ref 36.0–46.0)
Hemoglobin: 9 g/dL — ABNORMAL LOW (ref 12.0–15.0)
MCH: 32 pg (ref 26.0–34.0)
MCHC: 32.7 g/dL (ref 30.0–36.0)
MCV: 97.9 fL (ref 80.0–100.0)
Platelets: 249 10*3/uL (ref 150–400)
RBC: 2.81 MIL/uL — ABNORMAL LOW (ref 3.87–5.11)
RDW: 14.1 % (ref 11.5–15.5)
WBC: 8.9 10*3/uL (ref 4.0–10.5)
nRBC: 0 % (ref 0.0–0.2)

## 2019-04-24 LAB — GLUCOSE, CAPILLARY: Glucose-Capillary: 108 mg/dL — ABNORMAL HIGH (ref 70–99)

## 2019-04-24 LAB — BASIC METABOLIC PANEL
Anion gap: 9 (ref 5–15)
BUN: 17 mg/dL (ref 8–23)
CO2: 25 mmol/L (ref 22–32)
Calcium: 8.5 mg/dL — ABNORMAL LOW (ref 8.9–10.3)
Chloride: 104 mmol/L (ref 98–111)
Creatinine, Ser: 0.53 mg/dL (ref 0.44–1.00)
GFR calc Af Amer: >60 mL/min (ref >60)
GFR calc non Af Amer: >60 mL/min (ref >60)
Glucose, Bld: 101 mg/dL — ABNORMAL HIGH (ref 70–99)
Potassium: 4.2 mmol/L (ref 3.5–5.1)
Sodium: 138 mmol/L (ref 135–145)

## 2019-04-24 MED ORDER — CLOPIDOGREL BISULFATE 75 MG PO TABS: 75.0000 mg | ORAL_TABLET | Freq: Every day | ORAL | 1 refills | Status: DC

## 2019-04-24 NOTE — Discharge Summary (Addendum)
[facility]   MULTIDISCIPLINARY HEART VALVE TEAM  Discharge Summary    Patient ID: Katherine [name] MRN: [id number]; DOB: 10-10-1945  Admit date: 04/22/2019 Discharge date: 04/24/2019  Primary Care Provider: Coralee Rud, PA-C  Primary Cardiologist: Arnette Felts / Dr. Excell Seltzer & Dr. Cornelius Moras (TAVR)  Discharge Diagnoses    Principal Problem:   S/P TAVR (transcatheter aortic valve replacement) Active Problems:   Hyperlipidemia   Essential hypertension   GERD   Fibromyalgia   Osteoporosis   Severe aortic stenosis   Normocytic anemia   Diabetic foot ulcer (HCC)   Type 2 diabetes mellitus with complication (HCC)   Allergies Allergies  Allergen Reactions  . Penicillins Other (See Comments)    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 PATIENT HAS HAD A PCN REACTION WITH IMMEDIATE RASH, FACIAL/TONGUE/THROAT SWELLING, SOB, OR LIGHTHEADEDNESS WITH HYPOTENSION:  #  #  #  YES  #  #  #   Has patient had a PCN reaction causing severe rash involving mucus membranes or skin necrosis: Unknown Has patient had a PCN reaction that required hospitalization: Unknown Has patient had a PCN reaction occurring within the last 10 years: Unknown   . Pollen Extract     UNSPECIFIED REACTION   . Coconut Oil Itching    UNSPECIFIED REACTION   . Mellaril [Thioridazine] Itching    Diagnostic Studies/Procedures     TAVR OPERATIVE NOTE   Date of Procedure:04/22/2019  Preoperative Diagnosis:Severe Aortic Stenosis   Postoperative Diagnosis:Same   Procedure:   Transcatheter Aortic Valve Replacement - Percutaneous Transfemoral Approach Edwards Sapien 3 UltraTHV (size25mm, model # [id number], serial [id number])  Co-Surgeons:Clarence H. Cornelius Moras, MD and [name], MD  Anesthesiologist:David Noreene Larsson, MD  Delano Metz, MD  Pre-operative Echo  Findings: ? Severe aortic stenosis ? Normalleft ventricular systolic function  Post-operative Echo Findings: ? Noparavalvular leak ? Normal/unchangedleft ventricular systolic function  _________________   Echo 04/23/19: IMPRESSIONS  1. Day 1 post TAVR, normal transaortic gradients with peak/mean gradients 9/5 mHg, no paravalvular leak.  2. Left ventricular ejection fraction, by estimation, is 65 to 70%. The  left ventricle has hyperdynamic function. Left ventricular diastolic  parameters are consistent with Grade I diastolic dysfunction (impaired  relaxation). Elevated left ventricular  end-diastolic pressure.  3. Mild mitral valve regurgitation.  4. Tricuspid valve regurgitation is mild to moderate.  5. The aortic valve has been repaired/replaced. There is a 26 mm Edwards Sapien prosthetic (TAVR) valve present in the aortic position. Procedure  Date: 04/23/2019. Aortic valve mean gradient measures 5.0 mmHg.  6. There is mildly elevated pulmonary artery systolic pressure. The  estimated right ventricular systolic pressure is 30.2 mmHg.    History of Present Illness     [name] is a 74 y.o. female with a history of CAD, GERD, TIA (12/2018), remote atrial fibrillation (not on OAC) and recent monitor with no definite afib, history of PE, hypothyroidism, and severe aortic stenosis who presented to [facility] on 04/22/19 for planned TAVR.   Patient states she has known of presence of a heart murmur for many years.  She has been followed by Roxanne Mins for many years with multiple medical problems. 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  Echocardiograms have documented the presence of aortic stenosis that has gradually progressed in severity. She was originally referred to the multidisciplinary heart valve clinic last summer at which time she was evaluated by Dr. Excell Seltzer. Echocardiogram performed at that time revealed moderate aortic  stenosis  with normal left ventricular systolic function.  Diagnostic cardiac catheterization revealed multivessel coronary artery disease with long segment high-grade stenosis of the right coronary artery and 75% stenosis in the left circumflex. There was nonobstructive disease in the left anterior descending coronary artery. Mean transvalvular gradient across the aortic valve was measured 25 mmHg at that time, corresponding to aortic valve area calculated 1.22 cm.  Medical therapy was recommended at the time. She was later evaluated by Dr. Cornelius Moras who felt these required revascularization. She underwent successful two-vessel orbital atherectomy and PCI using a 3.0 x 20 mm resolute Onyx DES in the RCA, 3.0 x 8 mm resolute Onyx DES in the proximal circumflex, and 2.5 x 26 mm resolute Onyx DES in the mid circumflex on 04/15/19.  The patient has been evaluated by the multidisciplinary valve team and felt to have severe, symptomatic aortic stenosis and to be a suitable candidate for TAVR, which was set up for 04/22/19.   Hospital Course     Consultants: none  Severe AS:s/p successful TAVR with a 26 mm Edwards Sapien 3 THV via the TF approach on 04/22/19. Post operative echo showed EF 65-70%, normally functioning TAVR with a mean gradient of 5 mm Hg and no PVL. Groin sites are stable (left with significant ecchymosis but soft). ECG with sinus and no high grade heart block. Continue Asprin and plavix. Plan was for early follow up in the office next week, but pt has an apt with her PCP and would prefer a virtual visit.   CAD: pre TAVR cath showed severe 2 vessel CAD with severe stenosis of the proximal circumflex and mid-RCA. She underwent successful two-vessel orbital atherectomy and PCI using a 3.0 x 20 mm resolute Onyx DES in the RCA, 3.0 x 8 mm resolute Onyx DES in the proximal circumflex, and 2.5 x 26 mm resolute Onyx DES in the mid circumflex. Continue DAPT with aspirin and plavix. Continue statin  DMT2:  continue SSI while admitted. Okay to resume Metformin tonight  Post op anemia: Hg dropped from 12.6--> 9.0. Groin sites are stable. Will recheck labs next week ( will discuss with Arnette Felts who she is seeing next week). _____________  Discharge Vitals Blood pressure (!) 143/74, pulse 89, temperature 98.5 F (36.9 C), temperature source Oral, resp. rate 20, height 4' 10.5" (1.486 m), weight 66.1 kg, SpO2 98 %.  Filed Weights   04/22/19 0954 04/23/19 0500 04/24/19 0359  Weight: 64 kg 66 kg 66.1 kg    GEN: Well nourished, well developed, in no acute distress HEENT: normal Neck: no JVD or masses Cardiac: RRR; no murmurs, rubs, or gallops,no edema (difficult to hear heart as patient would not stop talking during exam) Respiratory:  clear to auscultation bilaterally, normal work of breathing GI: soft, nontender, nondistended, + BS MS: no deformity or atrophy Skin: warm and dry, no rash. Extensive ecchymosis on both groins R>L. Soft and non tender.  Neuro:  Alert and Oriented x 3, Strength and sensation are intact Psych: euthymic mood, full affect   Labs & Radiologic Studies    CBC Recent Labs    04/23/19 0308 04/24/19 0355  WBC 11.5* 8.9  HGB 9.6* 9.0*  HCT 29.4* 27.5*  MCV 96.7 97.9  PLT 273 249   Basic Metabolic Panel Recent Labs    16/10/96 0308 04/24/19 0355  NA 137 138  K 4.1 4.2  CL 105 104  CO2 24 25  GLUCOSE 146* 101*  BUN 19 17  CREATININE 0.53 0.53  CALCIUM 8.7* 8.5*  MG 2.0  --    Liver Function Tests No results for input(s): AST, ALT, ALKPHOS, BILITOT, PROT, ALBUMIN in the last 72 hours. No results for input(s): LIPASE, AMYLASE in the last 72 hours. Cardiac Enzymes No results for input(s): CKTOTAL, CKMB, CKMBINDEX, TROPONINI in the last 72 hours. BNP Invalid input(s): POCBNP D-Dimer No results for input(s): DDIMER in the last 72 hours. Hemoglobin A1C No results for input(s): HGBA1C in the last 72 hours. Fasting Lipid Panel No results for  input(s): CHOL, HDL, LDLCALC, TRIG, CHOLHDL, LDLDIRECT in the last 72 hours. Thyroid Function Tests No results for input(s): TSH, T4TOTAL, T3FREE, THYROIDAB in the last 72 hours.  Invalid input(s): FREET3 _____________  DG Chest 2 View  Result Date: 04/18/2019 CLINICAL DATA:  Preop. Severe aortic stenosis. EXAM: CHEST - 2 VIEW COMPARISON:  Chest CTA 04/03/2019, radiograph 09/23/2015 FINDINGS: Low lung volumes. Unchanged heart size and mediastinal contours. Aortic atherosclerosis. Linear atelectasis or scarring in the left mid lung, unchanged from prior exam. No pulmonary edema, confluent airspace disease, pneumothorax or pleural effusion. Exaggerated thoracic kyphosis. Severe compression fracture in the midthoracic spine, unchanged from prior CT. Severe compression fracture in the lower thoracic spine is unchanged from 09/23/2015 radiograph. Reverse right shoulder arthroplasty. IMPRESSION: 1. No acute abnormality. 2. Low lung volumes with left mid lung atelectasis or scarring. 3. Stable severe compression fractures in the mid and lower thoracic spine. Aortic Atherosclerosis (ICD10-I70.0). Electronically Signed   By: Narda Rutherford M.D.   On: 04/18/2019 15:07   CARDIAC CATHETERIZATION  Result Date: 04/15/2019 Successful two-vessel orbital atherectomy and PCI using a 3.0 x 20 mm resolute Onyx DES in the RCA, 3.0 x 8 mm resolute Onyx DES in the proximal circumflex, and 2.5 x 26 mm resolute Onyx DES in the mid circumflex Recommendation: Continue aspirin and clopidogrel without interruption for at least 6 months.  Continue with plans for TAVR for treatment of severe symptomatic aortic stenosis.  CT CORONARY MORPH W/CTA COR W/SCORE W/CA W/CM &/OR WO/CM  Addendum Date: 04/03/2019   ADDENDUM REPORT: 04/03/2019 22:21 CLINICAL DATA:  58 -year-old female with severe aortic stenosis being evaluated for a TAVR procedure. EXAM: Cardiac TAVR CT TECHNIQUE: The patient was scanned on a [employer]. A 120  kV retrospective scan was triggered in the descending thoracic aorta at 111 HU's. Gantry rotation speed was 250 msecs and collimation was .6 mm. No beta blockade or nitro were given. The 3D data set was reconstructed in 5% intervals of the R-R cycle. Systolic and diastolic phases were analyzed on a dedicated work station using MPR, MIP and VRT modes. The patient received 80 cc of contrast. FINDINGS: Aortic Root: Aortic valve: Trileaflet Aortic valve calcium score: 1546 Aortic annulus: Diameter: 29mm x 21mm Perimeter: 79mm Area: 456 mm^2 Calcifications: Moderate annular calcifications adjacent to left coronary cusp Coronary height: Min Left - 12mm, Max Left - 16mm; Min Right - 13mm Sinotubular height: Left cusp - 20mm; Right cusp - 19mm; Noncoronary cusp - 19mm LVOT (as measured 3 mm below the annulus): Diameter: 30mm x 20mm Area: 471 mm^2 Calcifications: Moderate calcifications, located beneath left coronary cusp Aortic sinus width: Left cusp -32mm ; Right cusp - 28mm; Noncoronary cusp - 31mm Sinotubular junction width: 28mm x 27mm Optimum Fluoroscopic Angle for Delivery: LAO 27 CAU 11 Cardiac: Right atrium: Mild enlargement Right ventricle: Mild dilatation Pulmonary arteries: Normal size Pulmonary veins: Normal configuration Left atrium: Mild enlargement Left ventricle: Normal size Pericardium: Normal thickness Coronary arteries: Calcium  score 2164 (99th percentile) IMPRESSION: 1. Trileaflet aortic valve with severe calcifications (calcium score 1546) 2. Aortic annulus measures 58mm x 55mm in diameter with perimeter 4mm and area 456 mm^2. Annular measurements suitable for delivery of a 1mm Edwards-Sapien 3 valve 3. Moderate annular calcifications adjacent to the left coronary cusp, extending into the LVOT 4. Coronary to annulus distance is borderline low from the left main (30mm). Sufficient coronary to annulus distance from the RCA (67mm) 5. Optimum Fluoroscopic Angle for Delivery (centered on RCC): LAO 27 CAU  11 6. Coronary arteries are severely calcified (calcium score 2164, 99th percentile) Electronically Signed   By: Oswaldo Milian MD   On: 04/03/2019 22:21   Result Date: 04/03/2019 EXAM: OVER-READ INTERPRETATION  CT CHEST The following report is an over-read performed by radiologist Dr. Vinnie Langton of [facility], [place] on 04/03/2019. This over-read does not include interpretation of cardiac or coronary anatomy or pathology. The coronary calcium score/coronary CTA interpretation by the cardiologist is attached. COMPARISON:  None. FINDINGS: Extracardiac findings will be described separately under dictation for contemporaneously obtained CTA chest, abdomen and pelvis. IMPRESSION: 1. Please see separate dictation for contemporaneously obtained CTA chest, abdomen and pelvis 04/03/2019 for full description of relevant extracardiac findings. Electronically Signed: By: Vinnie Langton M.D. On: 04/03/2019 13:58   CT ANGIO CHEST AORTA W/CM & OR WO/CM  Result Date: 04/03/2019 CLINICAL DATA:  74 year old female with history of severe aortic stenosis under preprocedural evaluation for potential transcatheter aortic valve replacement (TAVR) procedure. EXAM: CT ANGIOGRAPHY CHEST, ABDOMEN AND PELVIS TECHNIQUE: Multidetector CT imaging through the chest, abdomen and pelvis was performed using the standard protocol during bolus administration of intravenous contrast. Multiplanar reconstructed images and MIPs were obtained and reviewed to evaluate the vascular anatomy. CONTRAST:  174mL OMNIPAQUE IOHEXOL 350 MG/ML SOLN COMPARISON:  None. FINDINGS: CTA CHEST FINDINGS Cardiovascular: Heart size is normal. There is no significant pericardial fluid, thickening or pericardial calcification. There is aortic atherosclerosis, as well as atherosclerosis of the great vessels of the mediastinum and the coronary arteries, including calcified atherosclerotic plaque in the left main, left anterior descending, left circumflex and  right coronary arteries. Severe thickening calcification of the aortic valve. 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 Mediastinum/Lymph Nodes: No pathologically enlarged mediastinal or hilar lymph nodes. Moderate hiatal hernia. No axillary lymphadenopathy. Lungs/Pleura: No suspicious appearing pulmonary nodules or masses are noted. No acute consolidative airspace disease. No pleural effusions. Musculoskeletal/Soft Tissues: Chronic appearing compression fractures of T6 and T11, most severe at T11 where there is complete loss of anterior vertebral body height and an acute kyphotic deformity. Status post right shoulder arthroplasty. There are no aggressive appearing lytic or blastic lesions noted in the visualized portions of the skeleton. CTA ABDOMEN AND PELVIS FINDINGS Hepatobiliary: Subcentimeter low-attenuation lesion in segment 2 of the liver, too small to characterize, but statistically likely to represent a tiny cyst. No other suspicious hepatic lesions. No intra or extrahepatic biliary ductal dilatation. Gallbladder is normal in appearance. Pancreas: No pancreatic mass. No pancreatic ductal dilatation. No pancreatic or peripancreatic fluid collections or inflammatory changes. Spleen: Unremarkable. Adrenals/Urinary Tract: Subcentimeter low-attenuation lesions in both kidneys, too small to characterize, but statistically likely to represent tiny cysts. No hydroureteronephrosis. Urinary bladder is normal in appearance. Right adrenal gland is normal. In the lateral limb of the left adrenal gland there is a 6 mm fatty attenuation lesion compatible with a small adrenal myelolipoma (axial image 81 of series 15). Stomach/Bowel: The appearance of the stomach is normal. No  pathologic dilatation of small bowel or colon. Numerous colonic diverticulae are noted, without surrounding inflammatory changes to suggest an acute diverticulitis at this time. Normal appendix. Vascular/Lymphatic: Aortic  atherosclerosis, without evidence of aneurysm or dissection in the abdominal or pelvic vasculature. Vascular findings and measurements pertinent to potential TAVR procedure, as detailed below. No lymphadenopathy noted in the abdomen or pelvis. Reproductive: Status post hysterectomy. Ovaries are not confidently identified may be surgically absent or atrophic. Other: Small umbilical hernia containing only omental fat. No significant volume of ascites. No pneumoperitoneum. Musculoskeletal: There are no aggressive appearing lytic or blastic lesions noted in the visualized portions of the skeleton. Old healed fractures of the parasymphyseal region of the right pubic bone and left inferior and superior pubic rami. VASCULAR MEASUREMENTS PERTINENT TO TAVR: AORTA: Minimal Aortic Diameter-14 x 12 mm Severity of Aortic Calcification-moderate RIGHT PELVIS: Right Common Iliac Artery - Minimal Diameter-8.3 x 8.0 mm Tortuosity-moderate Calcification-moderate Right External Iliac Artery - Minimal Diameter-7.2 x 6.3 mm Tortuosity-severe Calcification-mild Right Common Femoral Artery - Minimal Diameter-6.7 x 6.4 mm Tortuosity-mild Calcification-moderate LEFT PELVIS: Left Common Iliac Artery - Minimal Diameter-8.3 x 8.0 mm Tortuosity-severe Calcification-mild-to-moderate Left External Iliac Artery - Minimal Diameter-7.2 x 6.3 mm Tortuosity-moderate Calcification-none Left Common Femoral Artery - Minimal Diameter-6.7 x 6.4 mm Tortuosity-severe Calcification-mild-to-moderate Review of the MIP images confirms the above findings. IMPRESSION: 1. Vascular findings and measurements pertinent to potential TAVR procedure, as detailed above. 2. Severe thickening calcification of the aortic valve, compatible with the reported clinical history of severe aortic stenosis. 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 3. Aortic atherosclerosis, in addition to left main and 3 vessel coronary artery disease. 4.  Mild colonic diverticulosis without evidence of acute diverticulitis at this time. 5. Additional incidental findings, as above. Electronically Signed   By: Trudie Reed M.D.   On: 04/03/2019 14:58   ECHOCARDIOGRAM LIMITED  Result Date: 04/23/2019    ECHOCARDIOGRAM LIMITED REPORT   Patient Name:   ROBINN OVERHOLT Santillano Date of Exam: 04/23/2019 Medical Rec #:  [id number]         Height:       58.5 in Accession #:    [id number]        Weight:       145.5 lb Date of Birth:  [birth year]          BSA:          1.601 m Patient Age:    73 years          BP:           105/61 mmHg Patient Gender: F                 HR:           66 bpm. Exam Location:  Inpatient Procedure: Limited Echo, Limited Color Doppler and Cardiac Doppler Indications:    Post TAVR evaluation V43.3 / Z95.2  History:        Patient has prior history of Echocardiogram examinations, most                 recent 04/22/2019. CAD; Risk Factors:Dyslipidemia, Diabetes and                 Hypertension. GERD.                 Aortic Valve: 26 mm Edwards Sapien prosthetic, stented (TAVR)  valve is present in the aortic position. Procedure Date:                 04/23/2019.  Sonographer:    Leta Jungling RDCS Referring Phys: [id number] Pheng Prokop R Lanika Colgate IMPRESSIONS  1. Day 1 post TAVR, normal transaortic gradients with peak/mean gradients 9/5 mHg, no paravalvular leak.  2. Left ventricular ejection fraction, by estimation, is 65 to 70%. The left ventricle has hyperdynamic function. Left ventricular diastolic parameters are consistent with Grade I diastolic dysfunction (impaired relaxation). Elevated left ventricular end-diastolic pressure.  3. Mild mitral valve regurgitation.  4. Tricuspid valve regurgitation is mild to moderate.  5. The aortic valve has been repaired/replaced. There is a 26 mm Edwards Sapien prosthetic (TAVR) valve present in the aortic position. Procedure Date: 04/23/2019. Aortic valve mean gradient measures 5.0 mmHg.  6. There is mildly  elevated pulmonary artery systolic pressure. The estimated right ventricular systolic pressure is 30.2 mmHg. FINDINGS  Left Ventricle: Left ventricular ejection fraction, by estimation, is 65 to 70%. The left ventricle has hyperdynamic function. Elevated left ventricular end-diastolic pressure. Right Ventricle: There is mildly elevated pulmonary artery systolic pressure. The tricuspid regurgitant velocity is 2.61 m/s, and with an assumed right atrial pressure of 3 mmHg, the estimated right ventricular systolic pressure is 30.2 mmHg. Mitral Valve: Mild mitral valve regurgitation. Tricuspid Valve: Tricuspid valve regurgitation is mild to moderate. Aortic Valve: The aortic valve has been repaired/replaced. Aortic valve mean gradient measures 5.0 mmHg. Aortic valve peak gradient measures 9.0 mmHg. Aortic valve area, by VTI measures 3.17 cm. There is a 26 mm Edwards Sapien prosthetic, stented (TAVR)  valve present in the aortic position. Procedure Date: 04/23/2019.  LEFT VENTRICLE PLAX 2D LVOT diam:     2.50 cm  Diastology LV SV:         86       LV e' lateral:   5.77 cm/s LV SV Index:   54       LV E/e' lateral: 17.0 LVOT Area:     4.91 cm LV e' medial:    4.46 cm/s                         LV E/e' medial:  22.0  AORTIC VALVE AV Area (Vmax):    3.27 cm AV Area (Vmean):   3.43 cm AV Area (VTI):     3.17 cm AV Vmax:           150.00 cm/s AV Vmean:          102.333 cm/s AV VTI:            0.273 m AV Peak Grad:      9.0 mmHg AV Mean Grad:      5.0 mmHg LVOT Vmax:         99.80 cm/s LVOT Vmean:        71.500 cm/s LVOT VTI:          0.176 m LVOT/AV VTI ratio: 0.65 MITRAL VALVE                TRICUSPID VALVE MV Area (PHT): 3.12 cm     TR Peak grad:   27.2 mmHg MV Decel Time: 243 msec     TR Vmax:        261.00 cm/s MV E velocity: 98.00 cm/s MV A velocity: 103.00 cm/s  SHUNTS MV E/A ratio:  0.95         Systemic VTI:  0.18 m                             Systemic Diam: 2.50 cm Tobias Alexander MD Electronically signed by  Tobias Alexander MD Signature Date/Time: 04/23/2019/10:04:29 PM    Final    ECHOCARDIOGRAM LIMITED  Result Date: 04/22/2019    ECHOCARDIOGRAM LIMITED REPORT   Patient Name:   ROGUE PAUTLER Kross Date of Exam: 04/22/2019 Medical Rec #:  [id number]         Height:       58.5 in Accession #:    [id number]        Weight:       141.0 lb Date of Birth:  1945/04/29          BSA:          1.580 m Patient Age:    73 years          BP:           172/87 mmHg Patient Gender: F                 HR:           71 bpm. Exam Location:  Anesthesiology Procedure: Limited Echo, Echo Assisted Procedure, Limited Color Doppler and            Cardiac Doppler Indications:     Aortic Stenosis 135.0; TAVR Procedure  History:         Patient has prior history of Echocardiogram examinations, most                  recent 03/15/2019. CAD; Risk Factors:Hypertension, Dyslipidemia                  and Diabetes.  Sonographer:     Thurman Coyer RDCS (AE) Referring Phys:  [id number] MICHAEL COOPER Diagnosing Phys: Tobias Alexander MD IMPRESSIONS  1. This was a periprocedural TTE during a TAVR procedure via transfemoral access. A 26 mm Edwards-SAPIEN 3 valve was successfully deployed in the aortic position with improvement of transaortic gradients from peak/mean 41/26 mmHg to 6/3 mmHg. No paravalvular leak.  2. Left ventricular ejection fraction, by estimation, is 60 to 65%. The left ventricle has normal function. There is mild concentric left ventricular hypertrophy. Left ventricular diastolic function could not be evaluated.  3. Mild mitral valve regurgitation.  4. The aortic valve is tricuspid. Aortic valve regurgitation is mild. Severe aortic valve stenosis. FINDINGS  Left Ventricle: Left ventricular ejection fraction, by estimation, is 60 to 65%. The left ventricle has normal function. There is mild concentric left ventricular hypertrophy. Mitral Valve: There is moderate thickening of the mitral valve leaflet(s). There is mild calcification of the mitral  valve leaflet(s). Moderate mitral annular calcification. Mild mitral valve regurgitation. Tricuspid Valve: Tricuspid valve regurgitation is mild. Aortic Valve: The aortic valve is tricuspid. . There is severe thickening and severe calcifcation of the aortic valve. Aortic valve regurgitation is mild. Severe aortic stenosis is present. There is severe thickening of the aortic valve. There is severe calcifcation of the aortic valve. Aortic valve mean gradient measures 26.0 mmHg. Aortic valve peak gradient measures 19.8 mmHg. Aortic valve area, by VTI measures 1.10 cm. There is a 26 mm Edwards Sapien prosthetic, stented (TAVR) valve present in the aortic position. Pulmonic Valve: Pulmonic valve regurgitation is mild.  LEFT VENTRICLE PLAX 2D LVOT diam:     2.00 cm LV SV:  67 LV SV Index:   42 LVOT Area:     3.14 cm  AORTIC VALVE AV Area (Vmax):    1.04 cm AV Area (Vmean):   1.00 cm AV Area (VTI):     1.10 cm AV Vmax:           222.50 cm/s AV Vmean:          165.400 cm/s AV VTI:            0.606 m AV Peak Grad:      19.8 mmHg AV Mean Grad:      26.0 mmHg LVOT Vmax:         73.45 cm/s LVOT Vmean:        52.650 cm/s LVOT VTI:          0.213 m LVOT/AV VTI ratio: 0.35  SHUNTS Systemic VTI:  0.21 m Systemic Diam: 2.00 cm Tobias Alexander MD Electronically signed by Tobias Alexander MD Signature Date/Time: 04/22/2019/3:29:44 PM    Final    Structural Heart Procedure  Result Date: 04/22/2019 See surgical note for result.  CT ANGIO ABDOMEN PELVIS  W &/OR WO CONTRAST  Result Date: 04/03/2019 CLINICAL DATA:  74 year old female with history of severe aortic stenosis under preprocedural evaluation for potential transcatheter aortic valve replacement (TAVR) procedure. EXAM: CT ANGIOGRAPHY CHEST, ABDOMEN AND PELVIS TECHNIQUE: Multidetector CT imaging through the chest, abdomen and pelvis was performed using the standard protocol during bolus administration of intravenous contrast. Multiplanar reconstructed images and  MIPs were obtained and reviewed to evaluate the vascular anatomy. CONTRAST:  OMNIPAQUE IOHEXOL 350 MG/ML SOLN COMPARISON:  None. FINDINGS: CTA CHEST FINDINGS Cardiovascular: Heart size is normal. There is no significant pericardial fluid, thickening or pericardial calcification. There is aortic atherosclerosis, as well as atherosclerosis of the great vessels of the mediastinum and the coronary arteries, including calcified atherosclerotic plaque in the left main, left anterior descending, left circumflex and right coronary arteries. Severe thickening calcification of the aortic valve. 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 Mediastinum/Lymph Nodes: No pathologically enlarged mediastinal or hilar lymph nodes. Moderate hiatal hernia. No axillary lymphadenopathy. Lungs/Pleura: No suspicious appearing pulmonary nodules or masses are noted. No acute consolidative airspace disease. No pleural effusions. Musculoskeletal/Soft Tissues: Chronic appearing compression fractures of T6 and T11, most severe at T11 where there is complete loss of anterior vertebral body height and an acute kyphotic deformity. Status post right shoulder arthroplasty. There are no aggressive appearing lytic or blastic lesions noted in the visualized portions of the skeleton. CTA ABDOMEN AND PELVIS FINDINGS Hepatobiliary: Subcentimeter low-attenuation lesion in segment 2 of the liver, too small to characterize, but statistically likely to represent a tiny cyst. No other suspicious hepatic lesions. No intra or extrahepatic biliary ductal dilatation. Gallbladder is normal in appearance. Pancreas: No pancreatic mass. No pancreatic ductal dilatation. No pancreatic or peripancreatic fluid collections or inflammatory changes. Spleen: Unremarkable. Adrenals/Urinary Tract: Subcentimeter low-attenuation lesions in both kidneys, too small to characterize, but statistically likely to represent tiny cysts. No hydroureteronephrosis.  Urinary bladder is normal in appearance. Right adrenal gland is normal. In the lateral limb of the left adrenal gland there is a 6 mm fatty attenuation lesion compatible with a small adrenal myelolipoma (axial image 81 of series 15). Stomach/Bowel: The appearance of the stomach is normal. No pathologic dilatation of small bowel or colon. Numerous colonic diverticulae are noted, without surrounding inflammatory changes to suggest an acute diverticulitis at this time. Normal appendix. Vascular/Lymphatic: Aortic atherosclerosis, without evidence of aneurysm or  dissection in the abdominal or pelvic vasculature. Vascular findings and measurements pertinent to potential TAVR procedure, as detailed below. No lymphadenopathy noted in the abdomen or pelvis. Reproductive: Status post hysterectomy. Ovaries are not confidently identified may be surgically absent or atrophic. Other: Small umbilical hernia containing only omental fat. No significant volume of ascites. No pneumoperitoneum. Musculoskeletal: There are no aggressive appearing lytic or blastic lesions noted in the visualized portions of the skeleton. Old healed fractures of the parasymphyseal region of the right pubic bone and left inferior and superior pubic rami. VASCULAR MEASUREMENTS PERTINENT TO TAVR: AORTA: Minimal Aortic Diameter-14 x 12 mm Severity of Aortic Calcification-moderate RIGHT PELVIS: Right Common Iliac Artery - Minimal Diameter-8.3 x 8.0 mm Tortuosity-moderate Calcification-moderate Right External Iliac Artery - Minimal Diameter-7.2 x 6.3 mm Tortuosity-severe Calcification-mild Right Common Femoral Artery - Minimal Diameter-6.7 x 6.4 mm Tortuosity-mild Calcification-moderate LEFT PELVIS: Left Common Iliac Artery - Minimal Diameter-8.3 x 8.0 mm Tortuosity-severe Calcification-mild-to-moderate Left External Iliac Artery - Minimal Diameter-7.2 x 6.3 mm Tortuosity-moderate Calcification-none Left Common Femoral Artery - Minimal Diameter-6.7 x 6.4 mm  Tortuosity-severe Calcification-mild-to-moderate Review of the MIP images confirms the above findings. IMPRESSION: 1. Vascular findings and measurements pertinent to potential TAVR procedure, as detailed above. 2. Severe thickening calcification of the aortic valve, compatible with the reported clinical history of severe aortic stenosis. 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 3. Aortic atherosclerosis, in addition to left main and 3 vessel coronary artery disease. 4. Mild colonic diverticulosis without evidence of acute diverticulitis at this time. 5. Additional incidental findings, as above. Electronically Signed   By: Trudie Reedaniel  Entrikin M.D.   On: 04/03/2019 14:58   Disposition   Pt is being discharged home today in good condition.  Follow-up Plans & Appointments    Follow-up Information    Janetta Horahompson, Lissandro Dilorenzo R, PA-C. Go on 05/01/2019.   Specialties: Cardiology, Radiology Why: @ 2:30pm for your telephone virtual visit  Contact information: [address] [place] [id number] [phone]          Discharge Instructions    Amb Referral to Cardiac Rehabilitation   Complete by: As directed    Diagnosis: Valve Replacement   Valve: Aortic Comment - TAVR   After initial evaluation and assessments completed: Virtual Based Care may be provided alone or in conjunction with Phase 2 Cardiac Rehab based on patient barriers.: Yes      Discharge Medications   Allergies as of 04/24/2019      Reactions   Penicillins Other (See Comments)   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 PATIENT HAS HAD A PCN REACTION WITH IMMEDIATE RASH, FACIAL/TONGUE/THROAT SWELLING, SOB, OR LIGHTHEADEDNESS WITH HYPOTENSION:  #  #  #  YES  #  #  #   Has patient had a PCN reaction causing severe rash involving mucus membranes or skin necrosis: Unknown Has patient had a PCN reaction that required hospitalization: Unknown Has patient had a PCN reaction occurring  within the last 10 years: Unknown   Pollen Extract    UNSPECIFIED REACTION    Coconut Oil Itching   UNSPECIFIED REACTION    Mellaril [thioridazine] Itching      Medication List    TAKE these medications   albuterol 108 (90 Base) MCG/ACT inhaler Commonly known as: VENTOLIN HFA Inhale 1 puff into the lungs 2 (two) times daily as needed for wheezing or shortness of breath.   ascorbic acid 500 MG tablet Commonly known as: VITAMIN C Take 500 mg by  mouth daily.   aspirin EC 81 MG tablet Take 81 mg by mouth daily.   benztropine 0.5 MG tablet Commonly known as: COGENTIN Take 0.5 mg by mouth 2 (two) times daily.   calcium-vitamin D 500-200 MG-UNIT tablet Commonly known as: OSCAL WITH D Take 1 tablet by mouth 3 (three) times daily. What changed: when to take this   cetirizine 10 MG tablet Commonly known as: ZYRTEC TAKE 1 TABLET BY MOUTH DAILY   clindamycin 300 MG capsule Commonly known as: CLEOCIN Take 600 mg by mouth See admin instructions. 1 HOUR PRIOR TO DENTAL APPOINTMENTS   clopidogrel 75 MG tablet Commonly known as: Plavix Take 1 tablet (75 mg total) by mouth daily.   diclofenac sodium 1 % Gel Commonly known as: VOLTAREN Apply 2-4 g topically 4 (four) times daily as needed (Pain).   fluticasone 50 MCG/ACT nasal spray Commonly known as: FLONASE Place 1 spray into both nostrils daily as needed for allergies.   gabapentin 100 MG capsule Commonly known as: NEURONTIN Take 100 mg by mouth 3 (three) times daily.   hydrOXYzine 10 MG tablet Commonly known as: ATARAX/VISTARIL Take 1 tablet (10 mg total) by mouth 2 (two) times daily as needed. What changed: reasons to take this   ipratropium 0.06 % nasal spray Commonly known as: ATROVENT USE 1 SPRAY IN EACH NOSTRIL THREE TIMES DAILY What changed: See the new instructions.   metFORMIN 500 MG tablet Commonly known as: GLUCOPHAGE Take 500 mg by mouth 2 (two) times daily.   multivitamin with minerals Tabs  tablet Take 1 tablet by mouth daily.   OneTouch Delica Lancets 33G Misc CHECK BLOOD SUGAR ONCE DAILY AS DIRECTED   pantoprazole 40 MG tablet Commonly known as: PROTONIX TAKE 1 TABLET BY MOUTH DAILY What changed: when to take this   Potassium Chloride ER 20 MEQ Tbcr Take 20 mEq by mouth 2 (two) times daily.   risperiDONE 3 MG tablet Commonly known as: RISPERDAL Take 3 mg by mouth at bedtime.   rosuvastatin 40 MG tablet Commonly known as: CRESTOR Take 1 tablet (40 mg total) by mouth daily at 6 PM.   Urea 20 Intensive Hydrating 20 % cream Generic drug: urea Apply 1 application topically daily as needed (dry skin). For dry skin         Outstanding Labs/Studies   CBC  Duration of Discharge Encounter   Greater than 30 minutes including physician time.  Byrd Hesselbach, PA-C 04/24/2019, 9:44 AM [phone]  Available data reviewed. Agree with findings, assessment, and plan as outlined by Carlean Jews, PA-C. Pt discharged before I was able to see her. I reviewed echo and agree with medications and DC plans outlined above.   [name], M.D. 04/25/2019 8:27 AM

## 2019-04-24 NOTE — Progress Notes (Signed)
MOBILITY TEAM - Progress Note   04/24/19 1055  Mobility  Activity Ambulated in hall  Level of Assistance Modified independent, requires aide device or extra time  Assistive Device Cane  Distance Ambulated (ft) 490 ft  Mobility Response Tolerated well  Mobility performed by Mobility specialist  Bed Position Chair   Mod indep ambulating with SPC. SOB with walking, pt taking 1x standing rest break and able to perform pursed lip breathing without cues. Good recall of activity recommendations made by CRP I. Planning to d/c home this afternoon.  Ina Homes, PT, DPT Mobility Team Pager [phone]

## 2019-04-24 NOTE — Progress Notes (Signed)
CARDIAC REHAB PHASE I   PRE:  Rate/Rhythm: 86 SR  BP:  Supine:   Sitting: 143/74  Standing:    SaO2: 97%RA  MODE:  Ambulation: 500 ft   POST:  Rate/Rhythm: 110 ST  BP:  Supine:   Sitting: 134/68  Standing:    SaO2: 98%RA [id number] Pt walked 500 ft on RA with rolling walker and little assistance. Tolerated well. Slightly SOB and stopped once to rest. To recliner with call bell. Encouraged pt not to get up by herself. Pt used BSC prior to going to recliner.   Luetta Nutting, RN BSN  04/24/2019 8:59 AM

## 2019-04-25 ENCOUNTER — Telehealth: Payer: Self-pay | Admitting: Physician Assistant

## 2019-04-25 NOTE — Telephone Encounter (Signed)
  [facility]   MULTIDISCIPLINARY HEART VALVE TEAM   Patient contacted regarding discharge from [facility] on 3/25  Patient understands to virtual follow up with provider Carlean Jews on 4/1 at [facility]  Patient understands discharge instructions? yes Patient understands medications and regimen? yes Patient understands to bring all medications to this visit? yes  Cline Crock PA-C  [facility]

## 2019-04-30 ENCOUNTER — Telehealth (HOSPITAL_COMMUNITY): Payer: Self-pay

## 2019-04-30 NOTE — Telephone Encounter (Signed)
Pt insurance is active and benefits verified through [facility] Medicare Co-pay 0, DED 0/0 met, out of pocket $7,550/0 met, co-insurance 20%. no pre-authorization required. Passport, 04/30/2019@11 :44am, REF# [id number]  Will contact patient to see if she is interested in the Cardiac Rehab Program. If interested, patient will need to complete follow up appt. Once completed, patient will be contacted for scheduling upon review by the RN Navigator.  2ndary insurance is active and benefits verified through [place]. Co-pay 0, DED 0/0 met, out of pocket 0/0 met, co-insurance 0. No pre-authorization required. Passport, 04/30/2019@11 :54, REF# [id number]

## 2019-05-01 ENCOUNTER — Ambulatory Visit: Payer: Medicare Other | Admitting: Physician Assistant

## 2019-05-01 ENCOUNTER — Telehealth (INDEPENDENT_AMBULATORY_CARE_PROVIDER_SITE_OTHER): Payer: Medicare Other | Admitting: Physician Assistant

## 2019-05-01 ENCOUNTER — Other Ambulatory Visit: Payer: Self-pay

## 2019-05-01 ENCOUNTER — Encounter: Payer: Self-pay | Admitting: Physician Assistant

## 2019-05-01 VITALS — Ht 58.5 in | Wt 141.0 lb

## 2019-05-01 DIAGNOSIS — Z952 Presence of prosthetic heart valve: Secondary | ICD-10-CM | POA: Diagnosis not present

## 2019-05-01 DIAGNOSIS — I25119 Atherosclerotic heart disease of native coronary artery with unspecified angina pectoris: Secondary | ICD-10-CM

## 2019-05-01 DIAGNOSIS — D649 Anemia, unspecified: Secondary | ICD-10-CM

## 2019-05-01 MED ORDER — CLINDAMYCIN HCL 300 MG PO CAPS: ORAL_CAPSULE | ORAL | 3 refills | Status: DC

## 2019-05-01 NOTE — Progress Notes (Signed)
[facility]   MULTIDISCIPLINARY HEART VALVE TEAM   Virtual Visit via Telephone Note   This visit type was conducted due to national recommendations for restrictions regarding the COVID-19 Pandemic (e.g. social distancing) in an effort to limit this patient's exposure and mitigate transmission in our community.  Due to her co-morbid illnesses, this patient is at least at moderate risk for complications without adequate follow up.  This format is felt to be most appropriate for this patient at this time.  The patient did not have access to video technology/had technical difficulties with video requiring transitioning to audio format only (telephone).  All issues noted in this document were discussed and addressed.  No physical exam could be performed with this format.  Please refer to the patient's chart for her  consent to telehealth for [facility].   The patient was identified using 2 identifiers.  Date:  05/01/2019   ID:  Katherine [name], DOB 1945/10/05, MRN [id number]  Patient Location: Home Provider Location: Office  PCP:  Coralee Rud, PA-C  Cardiologist: Arnette Felts PA-C / Dr. Excell Seltzer & Dr. Cornelius Moras (TAVR) Electrophysiologist:  None   Evaluation Performed:  Follow-Up Visit  Chief Complaint:  TOC s/p TAVR  History of Present Illness:    Katherine [name] is a 74 y.o. female with CAD, GERD, TIA (12/2018), remote atrial fibrillation (not on OAC) and recent monitor with no definite afib,history ofPE, hypothyroidism, and severe aortic stenosiss/p TAVR (04/22/19) who presents to clinic for follow up.   The patient does not have symptoms concerning for COVID-19 infection (fever, chills, cough, or new shortness of breath).   Patient states she has known of presence of a heart murmur for many years. She has been followed by Roxanne Mins for many years with multiple medical problems. 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  Echocardiograms have documented the presence of aortic stenosis that has gradually progressed in severity. She was originally referred to the multidisciplinary heart valve clinic last summer at which time she was evaluated by Dr. Excell Seltzer. Echocardiogram performed at that time revealed moderate aortic stenosis with normal left ventricular systolic function. Diagnostic cardiac catheterization revealed multivessel coronary artery disease with long segment high-grade stenosis of the right coronary artery and 75% stenosis in the left circumflex. There was nonobstructive disease in the left anterior descending coronary artery. Mean transvalvular gradient across the aortic valve was measured 25 mmHg at that time, corresponding to aortic valve area calculated 1.22 cm. Medical therapy was recommended at the time. She was later evaluated by Dr. Cornelius Moras who felt these required revascularization. She underwent successful two-vessel orbital atherectomy and PCI using a 3.0 x 20 mm resolute Onyx DES in the RCA, 3.0 x 8 mm resolute Onyx DES in the proximal circumflex, and 2.5 x 26 mm resolute Onyx DES in the mid circumflex on 04/15/19.  The patient was evaluated by the multidisciplinary valve team and underwent successful TAVR with a52mm Edwards Sapien 3 THV via the TF approach on 04/22/19. Post operative echoshowed EF 65-70%, normally functioning TAVR with a mean gradient of 5 mm Hg and no PVL. She was discharged on home on continued aspirin and plavix.   Today she presents via virtual medicine for follow up. No CP or SOB. No LE edema, orthopnea or PND. No dizziness or syncope. No blood in stool or urine. No palpitations. Patient difficult to follow as pt rambles..  Past Medical History:  Diagnosis Date  . ANXIETY 07/31/2007  .  CAD (coronary artery disease)   . Coronary artery disease involving native coronary artery of native heart with angina pectoris ([place])   . DEPRESSION 09/05/2006  . DIABETES MELLITUS, TYPE II   .  Diabetic foot ulcer ([place]) 10/26/2014  . DIVERTICULOSIS, COLON 09/05/2006  . FIBROMYALGIA 05/10/2009  . GERD 09/05/2006  . HYPERLIPIDEMIA 09/05/2006  . HYPERTENSION 09/05/2006  . MRSA infection 10/26/2014  . OSTEOARTHRITIS 09/05/2006  . OSTEOPOROSIS 07/31/2007  . 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 Severe aortic stenosis    s/p TAVR  . Unspecified hearing loss 10/09/2007   Past Surgical History:  Procedure Laterality Date  . ABDOMINAL HYSTERECTOMY  12/08   Bladder tac, partial hysterectomy  . BREAST BIOPSY    . catarct removal     both eyes  . COLONOSCOPY    . CORONARY ATHERECTOMY N/A 04/15/2019   Procedure: CORONARY ATHERECTOMY;  Surgeon: Sherren Mocha, MD;  Location: [place] [facility];  Service: Cardiovascular;  Laterality: N/A;  . CORONARY STENT INTERVENTION N/A 04/15/2019   Procedure: CORONARY STENT INTERVENTION;  Surgeon: Sherren Mocha, MD;  Location: [place] [facility];  Service: Cardiovascular;  Laterality: N/A;  . I & D KNEE WITH POLY EXCHANGE Left 01/03/2013   Procedure: IRRIGATION AND DEBRIDEMENT LEFT KNEE WITH POLY EXCHANGE;  Surgeon: Mcarthur Rossetti, MD;  Location: [facility];  Service: Orthopedics;  Laterality: Left;  . inguinal herniorrhapy     right Dr. [name] 06/2009  . REVERSE SHOULDER ARTHROPLASTY Right 09/07/2016  . REVERSE SHOULDER ARTHROPLASTY Right 09/07/2016   Procedure: REVERSE SHOULDER ARTHROPLASTY;  Surgeon: Tania Ade, MD;  Location: [place];  Service: Orthopedics;  Laterality: Right;  Right reverse shoulder arthroplasty  . RIGHT/LEFT HEART CATH AND CORONARY ANGIOGRAPHY N/A 09/11/2018   Procedure: RIGHT/LEFT HEART CATH AND CORONARY ANGIOGRAPHY;  Surgeon: Sherren Mocha, MD;  Location: [place] [facility];  Service: Cardiovascular;  Laterality: N/A;  . TEE WITHOUT CARDIOVERSION N/A 04/22/2019   Procedure: TRANSESOPHAGEAL ECHOCARDIOGRAM (TEE);  Surgeon: Sherren Mocha, MD;  Location: [place] [facility];  Service: Open Heart Surgery;  Laterality: N/A;  . TONSILLECTOMY    . TOTAL KNEE ARTHROPLASTY Left 12/20/2012   Procedure: LEFT TOTAL KNEE ARTHROPLASTY;  Surgeon: Mcarthur Rossetti, MD;  Location: [facility];  Service: Orthopedics;  Laterality: Left;  . TRANSCATHETER AORTIC VALVE REPLACEMENT, TRANSFEMORAL N/A 04/22/2019   Procedure: TRANSCATHETER AORTIC VALVE REPLACEMENT, TRANSFEMORAL;  Surgeon: Sherren Mocha, MD;  Location: [place] [facility];  Service: Open Heart Surgery;  Laterality: N/A;     Current Meds  Medication Sig  . albuterol (VENTOLIN HFA) 108 (90 Base) MCG/ACT inhaler Inhale 1 puff into the lungs 2 (two) times daily as needed for wheezing or shortness of breath.   [name] ascorbic acid (VITAMIN C) 500 MG tablet Take 500 mg by mouth daily.  [name] aspirin EC 81 MG tablet Take 81 mg by mouth daily.   . benztropine (COGENTIN) 0.5 MG tablet Take 0.5 mg by mouth 2 (two) times daily.    . calcium-vitamin D (OSCAL WITH D) 500-200 MG-UNIT tablet Take 1 tablet by mouth 3 (three) times daily. (Patient taking differently: Take 1 tablet by mouth 2 (two) times daily. )  . cetirizine (ZYRTEC) 10 MG tablet TAKE 1 TABLET BY MOUTH DAILY (Patient taking differently: Take 10 mg by mouth daily. )  . clindamycin (CLEOCIN) 300 MG capsule Take 600 mg by  mouth See admin instructions. 1 HOUR PRIOR TO DENTAL APPOINTMENTS  . clopidogrel (PLAVIX) 75 MG tablet Take 1 tablet (75 mg total) by mouth daily.  . diclofenac sodium (VOLTAREN) 1 % GEL Apply 2-4 g topically 4 (four) times daily as needed (Pain).   . fluticasone (FLONASE) 50 MCG/ACT nasal spray Place 1 spray into both nostrils daily as needed for allergies.   [name] gabapentin (NEURONTIN) 100 MG capsule Take 100 mg by mouth 3 (three) times daily.   . hydrOXYzine (ATARAX/VISTARIL) 10 MG tablet Take 1 tablet (10 mg total) by mouth 2 (two) times daily as needed. (Patient taking differently: Take 10 mg by mouth 2 (two) times  daily as needed for anxiety. )  . ipratropium (ATROVENT) 0.06 % nasal spray USE 1 SPRAY IN EACH NOSTRIL THREE TIMES DAILY (Patient taking differently: Place 1 spray into both nostrils 3 (three) times daily. )  . metFORMIN (GLUCOPHAGE) 500 MG tablet Take 500 mg by mouth 2 (two) times daily.   . Multiple Vitamin (MULTIVITAMIN WITH MINERALS) TABS tablet Take 1 tablet by mouth daily.  Letta Pate DELICA LANCETS 33G MISC CHECK BLOOD SUGAR ONCE DAILY AS DIRECTED  . pantoprazole (PROTONIX) 40 MG tablet TAKE 1 TABLET BY MOUTH DAILY (Patient taking differently: Take 40 mg by mouth 2 (two) times daily. )  . Potassium Chloride ER 20 MEQ TBCR Take 20 mEq by mouth 2 (two) times daily.   . risperiDONE (RISPERDAL) 3 MG tablet Take 3 mg by mouth at bedtime.   . rosuvastatin (CRESTOR) 40 MG tablet Take 1 tablet (40 mg total) by mouth daily at 6 PM.  . UREA 20 INTENSIVE HYDRATING 20 % cream Apply 1 application topically daily as needed (dry skin). For dry skin     Allergies:   Penicillins, Pollen extract, Coconut oil, and Mellaril [thioridazine]   Social History   Tobacco Use  . Smoking status: Never Smoker  . Smokeless tobacco: Never Used  Substance Use Topics  . Alcohol use: No    Alcohol/week: 0.0 standard drinks  . Drug use: No     Family Hx: [name] family history includes Coronary artery disease in an other family member; Diabetes in an other family member. There is no history of Colon cancer, Esophageal cancer, Stomach cancer, or Rectal cancer.  ROS:   Please see the history of present illness.    All other systems reviewed and are negative.   Prior CV studies:   The following studies were reviewed today:  TAVR OPERATIVE NOTE   Date of Procedure:04/22/2019  Preoperative Diagnosis:Severe Aortic Stenosis   Postoperative Diagnosis:Same   Procedure:   Transcatheter Aortic Valve Replacement - Percutaneous Transfemoral  Approach Edwards Sapien 3 UltraTHV (size3mm, model # [id number], serial [id number])  Co-Surgeons:Clarence H. Cornelius Moras, MD and [name], MD  Anesthesiologist:David Noreene Larsson, MD  Delano Metz, MD  Pre-operative Echo Findings: ? Severe aortic stenosis ? Normalleft ventricular systolic function  Post-operative Echo Findings: ? Noparavalvular leak ? Normal/unchangedleft ventricular systolic function  _________________   Echo3/24/21: IMPRESSIONS  1. Day 1 post TAVR, normal transaortic gradients with peak/mean gradients 9/5 mHg, no paravalvular leak.  2. Left ventricular ejection fraction, by estimation, is 65 to 70%. The  left ventricle has hyperdynamic function. Left ventricular diastolic  parameters are consistent with Grade I diastolic dysfunction (impaired  relaxation). Elevated left ventricular  end-diastolic pressure.  3. Mild mitral valve regurgitation.  4. Tricuspid valve regurgitation is mild to moderate.  5. The aortic valve has been repaired/replaced. There is a  26 mm Edwards Sapien prosthetic (TAVR) valve present in the aortic position. Procedure  Date: 04/23/2019. Aortic valve mean gradient measures 5.0 mmHg.  6. There is mildly elevated pulmonary artery systolic pressure. The  estimated right ventricular systolic pressure is 30.2 mmHg.     Labs/Other Tests and Data Reviewed:    EKG:  Not done  Recent Labs: 04/18/2019: ALT 30; B Natriuretic Peptide 97.7 04/23/2019: Magnesium 2.0 04/24/2019: BUN 17; Creatinine, Ser 0.53; Hemoglobin 9.0; Platelets 249; Potassium 4.2; Sodium 138   Recent Lipid Panel Lab Results  Component Value Date/Time   CHOL 140 06/15/2015 01:49 PM   TRIG 148.0 06/15/2015 01:49 PM   TRIG 104 01/24/2006 09:56 AM   HDL 39.00 (L) 06/15/2015 01:49 PM   CHOLHDL 4 06/15/2015 01:49 PM   LDLCALC 72 06/15/2015 01:49 PM   LDLDIRECT  66.0 06/16/2014 02:37 PM    Wt Readings from Last 3 Encounters:  05/01/19 141 lb (64 kg)  04/24/19 145 lb 11.6 oz (66.1 kg)  04/18/19 144 lb 12.8 oz (65.7 kg)     Objective:    Vital Signs:  Ht 4' 10.5" (1.486 m)   Wt 141 lb (64 kg)   BMI 28.97 kg/m     ASSESSMENT & PLAN:    Severe AS s/p TAVR:doing well. Groin sites and ECG checked by Arnette Felts PA-C today. Continue on aspirin and plavix. SBE prophylaxis discussed; I have RX'd clindamycin due to a PCN allergy. I will see her back later this month for follow up and echocardiogram.  CAD: pre TAVR cath showedsevere 2 vessel CAD with severe stenosis of the proximal circumflex and mid-RCA.She underwent successful two-vessel orbital atherectomy and PCI using a 3.0 x 20 mm resolute Onyx DES in the RCA, 3.0 x 8 mm resolute Onyx DES in the proximal circumflex, and 2.5 x 26 mm resolute Onyx DES in the mid circumflex. Continue DAPT with aspirin and plavix. Continue statin.   Post op anemia: Hg dropped from 12.6--> 9.0. She was seen by her PCP, Arnette Felts today. CBC checked at their office.   COVID-19 Education: The signs and symptoms of COVID-19 were discussed with the patient and how to seek care for testing (follow up with PCP or arrange E-visit).  The importance of social distancing was discussed today.  Time:   Today, I have spent 11 minutes with the patient with telehealth technology discussing the above problems.     Medication Adjustments/Labs and Tests Ordered: Current medicines are reviewed at length with the patient today.  Concerns regarding medicines are outlined above.   Tests Ordered: No orders of the defined types were placed in this encounter.   Medication Changes: No orders of the defined types were placed in this encounter.   Follow Up:  In Person in 3 week(s)  Signed, Cline Crock, PA-C  05/01/2019 2:26 PM    [facility]

## 2019-05-01 NOTE — Patient Instructions (Signed)
Medication Instructions:  No changes You will need to take antibiotics (clindamycin 600 mg) 1 hour prior to any dental procedures.  Lab Work: none If you have labs (blood work) drawn today and your tests are completely normal, you will receive your results only by: [name] MyChart Message (if you have MyChart) OR . A paper copy in the mail If you have any lab test that is abnormal or we need to change your treatment, we will call you to review the results.   Testing/Procedures: As scheduled.   Follow-Up: As scheduled.   Other Instructions

## 2019-05-01 NOTE — Addendum Note (Signed)
Addended by: Lendon Ka on: 05/01/2019 02:46 PM   Modules accepted: Orders

## 2019-05-06 ENCOUNTER — Other Ambulatory Visit: Payer: Self-pay | Admitting: Cardiology

## 2019-05-08 ENCOUNTER — Encounter: Payer: Self-pay | Admitting: *Deleted

## 2019-05-08 NOTE — Telephone Encounter (Signed)
This encounter was created in error - please disregard.

## 2019-05-09 ENCOUNTER — Telehealth: Payer: Self-pay

## 2019-05-09 NOTE — Telephone Encounter (Signed)
I left message for [facility] to call our office back for  more information about patient medical clearance that they fax over to our office.  Information needed listed below   1. What type of surgery is being performed?  2. When is this surgery scheduled?  3. What type of clearance is required (medical clearance vs. Pharmacy clearance to hold med vs. Both)?Are there any medications that need to be held prior to surgery and how long? 4. Name of physician performing surgery?  5. Anesthesia type (None, local, MAC, general) ?

## 2019-05-12 NOTE — Telephone Encounter (Signed)
   Primary Cardiologist: Roxanne Mins, PA-C  Structural Heart Cardiologist: [name], MD  Chart reviewed as part of pre-operative protocol coverage. Simple dental extractions are considered low risk procedures per guidelines and generally do not require any specific cardiac clearance. It is also generally accepted that for simple extractions and dental cleanings, there is no need to interrupt blood thinner therapy.   SBE prophylaxis is required for the patient. Patient has an allergy to penicillin. Will need clindamycin 600mg  to be taken 1 hour prior to her dental cleaning.   I will route this recommendation to the requesting party via Epic fax function and remove from pre-op pool.  Please call with questions.  Callback:  - Please ensure patient has a prescription for clindamycin 300mg  with instructions to take 2 tablets 1 hour prior to her dental cleaning. Thank you!  , PA-C 05/12/2019, 10:29 AM

## 2019-05-12 NOTE — Telephone Encounter (Signed)
   [facility] Pre-operative Risk Assessment    Request for surgical clearance:  1. What type of surgery is being performed?  DEEP CLEANING    2. When is this surgery scheduled?  TBD   3. What type of clearance is required (medical clearance vs. Pharmacy clearance to hold med vs. Both)?  BOTH AND DOES PT NEED ANAPHYLAXIS   4. Are there any medications that need to be held prior to surgery and how long? ASPIRIN   5. Practice name and name of physician performing surgery?  [facility]    6. What is your office phone number [phone]    7.   What is your office fax number [phone]  8.   Anesthesia type (None, local, MAC, general) ?  NONE    Katherine [name] 05/12/2019, 8:40 AM  _________________________________________________________________   (provider comments below)

## 2019-05-21 ENCOUNTER — Telehealth: Payer: Self-pay | Admitting: Cardiovascular Disease

## 2019-05-21 NOTE — Telephone Encounter (Signed)
Patient called in error 

## 2019-05-21 NOTE — Progress Notes (Signed)
[facility] [place]                                       Cardiology Office Note    Date:  05/22/2019   ID:  Katherine [name], DOB 02-Jul-1945, MRN [id number]  PCP:  [name], PA-C  Cardiologist: Roque Cash PA-C / Dr. Burt Knack & Dr. Roxy Manns (TAVR)  CC: 1 month s/p TAVR   History of Present Illness:  Katherine [name] is a 74 y.o. female with a history of CAD, GERD, TIA (12/2018), remote atrial fibrillation (not on [facility]) and recent monitor with no definite afib,history ofPE, hypothyroidism, and severe aortic stenosiss/p TAVR (04/22/19) who presents to clinic for follow up.   Patient states she has known of presence of a heart murmur for many years. She has been followed by Isaias Cowman for many years with multiple medical problems. 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 Echocardiograms have documented the presence of aortic stenosis that has gradually progressed in severity. She was originally referred to the multidisciplinary heart valve clinic last summer at which time she was evaluated by Dr. Burt Knack. Echocardiogram performed at that time revealed moderate aortic stenosis with normal left ventricular systolic function. Diagnostic cardiac catheterization revealed multivessel coronary artery disease with long segment high-grade stenosis of the right coronary artery and 75% stenosis in the left circumflex. There was nonobstructive disease in the left anterior descending coronary artery. Mean transvalvular gradient across the aortic valve was measured 25 mmHg at that time, corresponding to aortic valve area calculated 1.22 cm. Medical therapy was recommendedat the time. She was later evaluated by Dr. Roxy Manns who felt these required revascularization. She underwent successful two-vessel orbital atherectomy and PCI using a 3.0 x 20 mm resolute Onyx DES in the RCA, 3.0 x 8 mm resolute Onyx DES in the proximal circumflex,  and 2.5 x 26 mm resolute Onyx DES in the mid circumflexon 04/15/19.  The patient was evaluated by the multidisciplinary valve team and underwent successful TAVR with a52mm Edwards Sapien 3 THV via the TF approach on 04/22/19. Post operative echoshowed EF 65-70%, normally functioning TAVR with a mean gradient of 5 mm Hg and no PVL. She was discharged on home on continued aspirin and plavix.  Today she presents to clinic for follow up. We conferenced in [place] over the phone. She has had a big improvement in symptoms since TAVR. She is able to be more active now. No CP or SOB. No LE edema, orthopnea or PND. No dizziness or syncope. No blood in stool or urine. No palpitations.    Past Medical History:  Diagnosis Date  . ANXIETY 07/31/2007  . CAD (coronary artery disease)   . Coronary artery disease involving native coronary artery of native heart with angina pectoris (Daniel)   . DEPRESSION 09/05/2006  . DIABETES MELLITUS, TYPE II   . Diabetic foot ulcer (Katherine [name]) 10/26/2014  . DIVERTICULOSIS, COLON 09/05/2006  . FIBROMYALGIA 05/10/2009  . GERD 09/05/2006  . HYPERLIPIDEMIA 09/05/2006  . HYPERTENSION 09/05/2006  . MRSA infection 10/26/2014  . OSTEOARTHRITIS 09/05/2006  . OSTEOPOROSIS 07/31/2007  . 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 Severe aortic stenosis    s/p TAVR  . Unspecified hearing loss 10/09/2007  Past Surgical History:  Procedure Laterality Date  . ABDOMINAL HYSTERECTOMY  12/08   Bladder tac, partial hysterectomy  . BREAST BIOPSY    . catarct removal     both eyes  . COLONOSCOPY    . CORONARY ATHERECTOMY N/A 04/15/2019   Procedure: CORONARY ATHERECTOMY;  Surgeon: [name], MD;  Location: [facility] INVASIVE CV LAB;  Service: Cardiovascular;  Laterality: N/A;  . CORONARY STENT INTERVENTION N/A 04/15/2019   Procedure: CORONARY STENT INTERVENTION;  Surgeon: [name], MD;  Location: [facility]  INVASIVE CV LAB;  Service: Cardiovascular;  Laterality: N/A;  . I & D KNEE WITH POLY EXCHANGE Left 01/03/2013   Procedure: IRRIGATION AND DEBRIDEMENT LEFT KNEE WITH POLY EXCHANGE;  Surgeon: Kathryne Hitch, MD;  Location: [facility] ORS;  Service: Orthopedics;  Laterality: Left;  . inguinal herniorrhapy     right Dr. Freida Busman 06/2009  . REVERSE SHOULDER ARTHROPLASTY Right 09/07/2016  . REVERSE SHOULDER ARTHROPLASTY Right 09/07/2016   Procedure: REVERSE SHOULDER ARTHROPLASTY;  Surgeon: Jones Broom, MD;  Location: [facility] OR;  Service: Orthopedics;  Laterality: Right;  Right reverse shoulder arthroplasty  . RIGHT/LEFT HEART CATH AND CORONARY ANGIOGRAPHY N/A 09/11/2018   Procedure: RIGHT/LEFT HEART CATH AND CORONARY ANGIOGRAPHY;  Surgeon: [name], MD;  Location: [facility] INVASIVE CV LAB;  Service: Cardiovascular;  Laterality: N/A;  . TEE WITHOUT CARDIOVERSION N/A 04/22/2019   Procedure: TRANSESOPHAGEAL ECHOCARDIOGRAM (TEE);  Surgeon: [name], MD;  Location: [facility] INVASIVE CV LAB;  Service: Open Heart Surgery;  Laterality: N/A;  . TONSILLECTOMY    . TOTAL KNEE ARTHROPLASTY Left 12/20/2012   Procedure: LEFT TOTAL KNEE ARTHROPLASTY;  Surgeon: Kathryne Hitch, MD;  Location: [facility] ORS;  Service: Orthopedics;  Laterality: Left;  . TRANSCATHETER AORTIC VALVE REPLACEMENT, TRANSFEMORAL N/A 04/22/2019   Procedure: TRANSCATHETER AORTIC VALVE REPLACEMENT, TRANSFEMORAL;  Surgeon: [name], MD;  Location: [facility] INVASIVE CV LAB;  Service: Open Heart Surgery;  Laterality: N/A;    Current Medications: Outpatient Medications Prior to Visit  Medication Sig Dispense Refill  . albuterol (VENTOLIN HFA) 108 (90 Base) MCG/ACT inhaler Inhale 1 puff into the lungs 2 (two) times daily as needed for wheezing or shortness of breath.     [name] ascorbic acid (VITAMIN C) 500 MG tablet Take 500 mg by mouth daily.    [name] aspirin EC 81 MG tablet Take 81 mg by mouth daily.     . benztropine (COGENTIN) 0.5 MG tablet Take 0.5 mg by  mouth 2 (two) times daily.      . calcium-vitamin D (OSCAL WITH D) 500-200 MG-UNIT tablet Take 1 tablet by mouth 3 (three) times daily. 270 tablet 3  . cetirizine (ZYRTEC) 10 MG tablet TAKE 1 TABLET BY MOUTH DAILY 30 tablet 0  . clindamycin (CLEOCIN) 300 MG capsule Take 600 mg (2 capsules) by mouth 60 minutes prior to all dental procedures. 4 capsule 3  . clopidogrel (PLAVIX) 75 MG tablet Take 1 tablet (75 mg total) by mouth daily. 30 tablet 11  . diclofenac sodium (VOLTAREN) 1 % GEL Apply 2-4 g topically 4 (four) times daily as needed (Pain).     . fluticasone (FLONASE) 50 MCG/ACT nasal spray Place 1 spray into both nostrils daily as needed for allergies.     [name] gabapentin (NEURONTIN) 100 MG capsule Take 100 mg by mouth 3 (three) times daily.     . hydrOXYzine (ATARAX/VISTARIL) 10 MG tablet Take 1 tablet (10 mg total) by mouth 2 (two) times daily as needed. 30 tablet 11  . ipratropium (ATROVENT)  0.06 % nasal spray USE 1 SPRAY IN EACH NOSTRIL THREE TIMES DAILY 30 mL 11  . metFORMIN (GLUCOPHAGE) 500 MG tablet Take 500 mg by mouth 2 (two) times daily.     . Multiple Vitamin (MULTIVITAMIN WITH MINERALS) TABS tablet Take 1 tablet by mouth daily.    Letta Pate DELICA LANCETS 33G MISC CHECK BLOOD SUGAR ONCE DAILY AS DIRECTED 100 each 0  . pantoprazole (PROTONIX) 40 MG tablet TAKE 1 TABLET BY MOUTH DAILY 90 tablet 0  . Potassium Chloride ER 20 MEQ TBCR Take 20 mEq by mouth 2 (two) times daily.   1  . risperiDONE (RISPERDAL) 3 MG tablet Take 3 mg by mouth at bedtime.     . rosuvastatin (CRESTOR) 40 MG tablet TAKE 1 TABLET(40 MG) BY MOUTH DAILY AT 6 PM 30 tablet 11  . UREA 20 INTENSIVE HYDRATING 20 % cream Apply 1 application topically daily as needed (dry skin). For dry skin  6   No facility-administered medications prior to visit.     Allergies:   Penicillins, Pollen extract, Coconut oil, and Mellaril [thioridazine]   Social History   Socioeconomic History  . Marital status: Single    Spouse  name: Not on file  . Number of children: Not on file  . Years of education: Not on file  . Highest education level: Not on file  Occupational History  . Occupation: retired [employer]    Employer: DISABLED  Tobacco Use  . Smoking status: Never Smoker  . Smokeless tobacco: Never Used  Substance and Sexual Activity  . Alcohol use: No    Alcohol/week: 0.0 standard drinks  . Drug use: No  . Sexual activity: Not on file  Other Topics Concern  . Not on file  Social History Narrative   LIves alone with home health services.   Social Determinants of Health   Financial Resource Strain:   . Difficulty of Paying Living Expenses:   Food Insecurity:   . Worried About Programme researcher, broadcasting/film/video in the Last Year:   . Barista in the Last Year:   Transportation Needs:   . Freight forwarder (Medical):   [name] Lack of Transportation (Non-Medical):   Physical Activity:   . Days of Exercise per Week:   . Minutes of Exercise per Session:   Stress:   . Feeling of Stress :   Social Connections:   . Frequency of Communication with Friends and Family:   . Frequency of Social Gatherings with Friends and Family:   . Attends Religious Services:   . Active Member of Clubs or Organizations:   . Attends Banker Meetings:   [name] Marital Status:      Family History:  [name] family history includes Coronary artery disease in an other family member; Diabetes in an other family member.     ROS:   Please see the history of present illness.    ROS All other systems reviewed and are negative.   PHYSICAL EXAM:   VS:  BP 130/78   Pulse 91   Ht 4' 10.5" (1.486 m)   Wt 148 lb (67.1 kg)   SpO2 98%   BMI 30.41 kg/m    GEN: Well nourished, well developed, in no acute distress HEENT: normal Neck: no JVD or masses Cardiac: RRR; soft flow murmur. No rubs, or gallops,no edema  Respiratory:  clear to auscultation bilaterally, normal work of breathing GI: soft, nontender,  nondistended, + BS MS: no  deformity or atrophy Skin: warm and dry, no rash Neuro:  Alert and Oriented x 3, Strength and sensation are intact Psych: euthymic mood, full affect   Wt Readings from Last 3 Encounters:  05/22/19 148 lb (67.1 kg)  05/01/19 141 lb (64 kg)  04/24/19 145 lb 11.6 oz (66.1 kg)      Studies/Labs Reviewed:   EKG:  EKG is NOT ordered today.   Recent Labs: 04/18/2019: ALT 30; B Natriuretic Peptide 97.7 04/23/2019: Magnesium 2.0 04/24/2019: BUN 17; Creatinine, Ser 0.53; Hemoglobin 9.0; Platelets 249; Potassium 4.2; Sodium 138   Lipid Panel    Component Value Date/Time   CHOL 140 06/15/2015 1349   TRIG 148.0 06/15/2015 1349   TRIG 104 01/24/2006 0956   HDL 39.00 (L) 06/15/2015 1349   CHOLHDL 4 06/15/2015 1349   VLDL 29.6 06/15/2015 1349   LDLCALC 72 06/15/2015 1349   LDLDIRECT 66.0 06/16/2014 1437    Additional studies/ records that were reviewed today include:  TAVR OPERATIVE NOTE   Date of Procedure:04/22/2019  Preoperative Diagnosis:Severe Aortic Stenosis   Postoperative Diagnosis:Same   Procedure:   Transcatheter Aortic Valve Replacement - Percutaneous Transfemoral Approach Edwards Sapien 3 UltraTHV (size1126mm, model # [id number], serial [id number])  Co-Surgeons:Clarence H. Cornelius Moraswen, MD and Tonny BollmanMichael Cooper, MD  Anesthesiologist:David Noreene LarssonJoslin, MD  Delano MetzEchocardiographer:Katarina Nelson, MD  Pre-operative Echo Findings: ? Severe aortic stenosis ? Normalleft ventricular systolic function  Post-operative Echo Findings: ? Noparavalvular leak ? Normal/unchangedleft ventricular systolic function  _________________   Echo3/24/21: IMPRESSIONS  1. Day 1 post TAVR, normal transaortic gradients with peak/mean gradients 9/5 mHg, no paravalvular leak.  2. Left ventricular ejection fraction, by estimation, is 65 to 70%. The   left ventricle has hyperdynamic function. Left ventricular diastolic  parameters are consistent with Grade I diastolic dysfunction (impaired  relaxation). Elevated left ventricular  end-diastolic pressure.  3. Mild mitral valve regurgitation.  4. Tricuspid valve regurgitation is mild to moderate.  5. The aortic valve has been repaired/replaced. There is a 26 mm Edwards Sapien prosthetic (TAVR) valve present in the aortic position. Procedure  Date: 04/23/2019. Aortic valve mean gradient measures 5.0 mmHg.  6. There is mildly elevated pulmonary artery systolic pressure. The  estimated right ventricular systolic pressure is 30.2 mmHg.   _____________________  Echo 05/22/19 IMPRESSIONS  1. Left ventricular ejection fraction, by estimation, is 60 to 65%. The left ventricle has normal function. The left ventricle has no regional wall motion abnormalities. There is mild left ventricular hypertrophy. Left ventricular diastolic parameters  were normal.  2. Right ventricular systolic function is normal. The right ventricular size is normal. There is normal pulmonary artery systolic pressure.  3. Left atrial size was mildly dilated.  4. The mitral valve is normal in structure. Trivial mitral valve regurgitation. No evidence of mitral stenosis.  5. Well seated 26 mm Sapien 3 Ultra valve with no PVL. Gradients have increased since 04/23/19 day one post implant when mean gradient was 5 mmHg and peak 9 mmHg . The aortic valve has been repaired/replaced. Aortic valve regurgitation is not visualized.  No aortic stenosis is present. There is a 26 mm Edwards Sapien prosthetic (TAVR) valve present in the aortic position. Procedure Date: 04/22/19.  6. The inferior vena cava is normal in size with greater than 50% respiratory variability, suggesting right atrial pressure of 3 mmHg.    ASSESSMENT & PLAN:   Severe AS s/p TAVR: echo today shows EF 60%, normally functioning TAVR with a mean gradient of 10  mm hg and  no PVL (gradients increased from POD1 echo where mean gradient 5 mm hg but still within an acceptable range). SBE prophylaxis discussed; I have RX'd clindamycin due to a PCN allergy. She has NYHA class II symptoms, but has had a marked symptomatic improvement since TAVR. Continue on aspirin and plaivx. Will stop plavix after 6 months from TAVR (10/23/19).  CAD: pre TAVR cath showedsevere 2 vessel CAD with severe stenosis of the proximal circumflex and mid-RCA.She underwent successful two-vessel orbital atherectomy and PCI using a 3.0 x 20 mm resolute Onyx DES in the RCA, 3.0 x 8 mm resolute Onyx DES in the proximal circumflex, and 2.5 x 26 mm resolute Onyx DES in the mid circumflex. Continue DAPT with aspirin and plavix (will stop plavix as outlined above). Continue statin.    Medication Adjustments/Labs and Tests Ordered: Current medicines are reviewed at length with the patient today.  Concerns regarding medicines are outlined above.  Medication changes, Labs and Tests ordered today are listed in the Patient Instructions below. Patient Instructions  Medication Instructions:  1) you may STOP PLAVIX October 23, 2019 2) Do not forget to take your CLINDAMYCIN 600 mg 1 hour prior to your dental visits! *If you need a refill on your cardiac medications before your next appointment, please call your pharmacy*   Follow-Up: Please continue your regular follow-up with Arnette Felts.  We will call you to arrange your 1 year TAVR echo and office visits.     Signed, Cline Crock, PA-C  05/22/2019 9:23 PM    [facility] [address] [place], [place], [place]  [address] Phone: [phone]; Fax: [phone]

## 2019-05-22 ENCOUNTER — Other Ambulatory Visit: Payer: Self-pay

## 2019-05-22 ENCOUNTER — Ambulatory Visit (HOSPITAL_COMMUNITY): Payer: Medicare Other | Attending: Cardiovascular Disease

## 2019-05-22 ENCOUNTER — Encounter: Payer: Self-pay | Admitting: Physician Assistant

## 2019-05-22 ENCOUNTER — Other Ambulatory Visit: Payer: Self-pay | Admitting: Physician Assistant

## 2019-05-22 ENCOUNTER — Ambulatory Visit (INDEPENDENT_AMBULATORY_CARE_PROVIDER_SITE_OTHER): Payer: Medicare Other | Admitting: Physician Assistant

## 2019-05-22 VITALS — BP 130/78 | HR 91 | Ht 58.5 in | Wt 148.0 lb

## 2019-05-22 DIAGNOSIS — I25119 Atherosclerotic heart disease of native coronary artery with unspecified angina pectoris: Secondary | ICD-10-CM | POA: Diagnosis not present

## 2019-05-22 DIAGNOSIS — Z952 Presence of prosthetic heart valve: Secondary | ICD-10-CM | POA: Insufficient documentation

## 2019-05-22 LAB — ECHOCARDIOGRAM COMPLETE
Height: 58.5 in
Weight: 2368 oz

## 2019-05-22 NOTE — Patient Instructions (Signed)
Medication Instructions:  1) you may STOP PLAVIX October 23, 2019 2) Do not forget to take your CLINDAMYCIN 600 mg 1 hour prior to your dental visits! *If you need a refill on your cardiac medications before your next appointment, please call your pharmacy*   Follow-Up: Please continue your regular follow-up with Arnette Felts.  We will call you to arrange your 1 year TAVR echo and office visits.

## 2019-05-23 NOTE — Telephone Encounter (Signed)
Called and spoke with pt in regards to CR, pt stated she was not sure. Adv pt I will mail her out a brochure and follow up with her in 2 weeks.

## 2019-05-26 ENCOUNTER — Telehealth: Payer: Self-pay | Admitting: Cardiovascular Disease

## 2019-05-26 NOTE — Telephone Encounter (Signed)
Patient called stating her dentist wants her to get a letter stating it okay for her to have work done on her teeth.

## 2019-05-26 NOTE — Telephone Encounter (Signed)
I s/w the pt and asked her who is doing her dental work as I am going to need further information for the pre op team. Pt states [facility] is doing her dental work. I assured the pt that I will reach out to the dental office so that I may obtain dental procedure information. Once I have the I have the information I will send to pre op.     [facility] Pre-operative Risk Assessment    Request for surgical clearance:  1. What type of surgery is being performed? CROWN AND PERIO DENTAL MAINTANCE   2. When is this surgery scheduled? TBD   3. What type of clearance is required (medical clearance vs. Pharmacy clearance to hold med vs. Both)? MEDICAL  4. Are there any medications that need to be held prior to surgery and how long? ASA   5. Practice name and name of physician performing surgery? [facility]; DR. Darrell Jewel; DR. DeSHEILD-MAYS   6. What is your office phone number [phone]    7.   What is your office fax number [phone]  8.   Anesthesia type (None, local, MAC, general) ? LOCAL; LIDOCAINE OR SEPTACAINE    Julaine Hua 05/26/2019, 12:19 PM  _________________________________________________________________   (provider comments below)

## 2019-05-28 NOTE — Telephone Encounter (Signed)
No we are not awaiting on any further information. All information is available in the pre op note. Phone note was just FYI as pt had called in and we had not received an actual clearance request.

## 2019-05-28 NOTE — Telephone Encounter (Signed)
Reached out to the dental office to confirm exactly what is perio dental maintenance include (is this a deep cleaning). Dental office is at lunch as of 12:30. I will try again later.

## 2019-05-28 NOTE — Telephone Encounter (Signed)
   Primary Cardiologist: Roxanne Mins, PA-C; also followed by structural heart team with our group  Chart reviewed as part of pre-operative protocol coverage. Callback, are we still waiting on additional information from the dental office? Just need more information what the specific procedure entails or if this is just a generalized question surrounding routine dental maintenance. Patient had PCI and TAVR in 03/2019 so will likely not advise holding antiplatelets. Patient will need SBE ppx addressed as well in our final clearance.  Laurann Montana, PA-C 05/28/2019, 11:26 AM

## 2019-05-28 NOTE — Telephone Encounter (Signed)
OK- then can you please clarify what crown and perio dental maintenance entails? Is this one specific procedure they have planned or just an ongoing blanket dental cleaning/maintenance? Would need to know if they are OK doing procedure on aspirin given that patient had fresh stent and heart surgery a month ago. Thanks!

## 2019-05-29 ENCOUNTER — Telehealth (HOSPITAL_COMMUNITY): Payer: Self-pay

## 2019-05-29 ENCOUNTER — Other Ambulatory Visit: Payer: Self-pay

## 2019-05-29 MED ORDER — CLINDAMYCIN HCL 300 MG PO CAPS: ORAL_CAPSULE | ORAL | 3 refills | Status: DC

## 2019-05-29 NOTE — Telephone Encounter (Signed)
   Primary Cardiologist: Roxanne Mins, PA-C  Chart reviewed as part of pre-operative protocol coverage. Simple dental extractions are considered low risk procedures per guidelines and generally do not require any specific cardiac clearance. It is also generally accepted that for simple extractions and dental cleanings, there is no need to interrupt blood thinner therapy.   SBE prophylaxis is required for the patient.  I will route this recommendation to the requesting party via Epic fax function and remove from pre-op pool.  Please call with questions.  Callback: - Please ensure the patient takes her clindamycin 1 hour prior to her upcoming dental procedure. Send Rx for clindamycin 300mg  tablets - take 2 tablets 1 hour prior to dental procedures if needed. Thank you!  , PA-C 05/29/2019, 10:54 AM

## 2019-05-29 NOTE — Telephone Encounter (Signed)
I called and spoke with dental office, they stated that patient will have a regular cleaning without numbing medication. Also stated that the crown prep will need numbing medication. They want to make sure they can use numbing medication for crown prep? Looks like patient is also on clindamycin prior to dental procedures they will need something with that on there for the dentist to review.

## 2019-05-29 NOTE — Telephone Encounter (Signed)
Pt called back to schedule CR, pt will come in for CR orientation on 06/19/19 @ 2PM and will attend the 3PM class.  Will contact transportation for pt.  Mailed letter.

## 2019-05-29 NOTE — Telephone Encounter (Signed)
I called and spoke with Katherine [name], ok per patient DPR. Lucendia Herrlich is aware to have patient take 2 Clindamycin 1 hour prior to dental procedure. RX sent to pharmacy.

## 2019-06-02 ENCOUNTER — Telehealth: Payer: Self-pay | Admitting: Cardiovascular Disease

## 2019-06-02 MED ORDER — ROSUVASTATIN CALCIUM 40 MG PO TABS: ORAL_TABLET | ORAL | 3 refills | Status: DC

## 2019-06-02 MED ORDER — CLOPIDOGREL BISULFATE 75 MG PO TABS: 75.0000 mg | ORAL_TABLET | Freq: Every day | ORAL | 3 refills | Status: DC

## 2019-06-02 NOTE — Telephone Encounter (Signed)
Pt's medication was sent to pt's pharmacy as requested. Confirmation received.  °

## 2019-06-02 NOTE — Telephone Encounter (Signed)
*  STAT* If patient is at the pharmacy, call can be transferred to refill team.   1. Which medications need to be refilled? (please list name of each medication and dose if known)  clopidogrel (PLAVIX) 75 MG tablet rosuvastatin (CRESTOR) 40 MG tablet  2. Which pharmacy/location (including street and city if local pharmacy) is medication to be sent to? [id number] - [place], [place] - [address] AT [facility] OF [address]  3. Do they need a 30 day or 90 day supply?  90 day supply

## 2019-06-16 ENCOUNTER — Telehealth (HOSPITAL_COMMUNITY): Payer: Self-pay | Admitting: *Deleted
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
## 2019-06-17 ENCOUNTER — Telehealth (HOSPITAL_COMMUNITY): Payer: Self-pay | Admitting: Pharmacist

## 2019-06-17 NOTE — Telephone Encounter (Signed)
Cardiac Rehab Medication Review by a Pharmacist  Unfortunately, I was unable to complete an accurate medication history today. She has some baseline delusions and was not able to discuss what medications she takes today.   Please follow-up with this at the cardiac rehab orientation appointment. Hopefully the patient will bring their medications with them as instructed.   Domenic Moras, PharmD PGY1 Ambulatory Care Pharmacy Resident 06/17/2019 11:32 AM

## 2019-06-17 NOTE — Telephone Encounter (Signed)
Cardiac Rehab Medication Review by a Pharmacist  Unable to accurately complete medication reconciliation with the patient today. She was very sweet but was delusion and was not able to accurately tell me what medications she takes.    Domenic Moras, PharmD PGY1 Ambulatory Care Pharmacy Resident 06/17/2019 11:22 AM

## 2019-06-18 ENCOUNTER — Other Ambulatory Visit: Payer: Self-pay

## 2019-06-18 ENCOUNTER — Telehealth (HOSPITAL_COMMUNITY): Payer: Self-pay | Admitting: *Deleted

## 2019-06-18 ENCOUNTER — Encounter (HOSPITAL_COMMUNITY): Payer: Medicare Other

## 2019-06-18 ENCOUNTER — Encounter (HOSPITAL_COMMUNITY)
Admission: RE | Admit: 2019-06-18 | Discharge: 2019-06-18 | Disposition: A | Discharge status: Home / Self Care | Payer: Medicare Other | Source: Ambulatory Visit | Attending: Cardiovascular Disease | Admitting: Cardiovascular Disease

## 2019-06-18 DIAGNOSIS — M797 Fibromyalgia: Secondary | ICD-10-CM | POA: Insufficient documentation

## 2019-06-18 DIAGNOSIS — Z79899 Other long term (current) drug therapy: Secondary | ICD-10-CM | POA: Insufficient documentation

## 2019-06-18 DIAGNOSIS — H9190 Unspecified hearing loss, unspecified ear: Secondary | ICD-10-CM | POA: Insufficient documentation

## 2019-06-18 DIAGNOSIS — Z8631 Personal history of diabetic foot ulcer: Secondary | ICD-10-CM | POA: Insufficient documentation

## 2019-06-18 DIAGNOSIS — K219 Gastro-esophageal reflux disease without esophagitis: Secondary | ICD-10-CM | POA: Insufficient documentation

## 2019-06-18 DIAGNOSIS — Z7982 Long term (current) use of aspirin: Secondary | ICD-10-CM | POA: Insufficient documentation

## 2019-06-18 DIAGNOSIS — E119 Type 2 diabetes mellitus without complications: Secondary | ICD-10-CM | POA: Insufficient documentation

## 2019-06-18 DIAGNOSIS — I2510 Atherosclerotic heart disease of native coronary artery without angina pectoris: Secondary | ICD-10-CM | POA: Insufficient documentation

## 2019-06-18 DIAGNOSIS — Z955 Presence of coronary angioplasty implant and graft: Secondary | ICD-10-CM | POA: Insufficient documentation

## 2019-06-18 DIAGNOSIS — I10 Essential (primary) hypertension: Secondary | ICD-10-CM | POA: Insufficient documentation

## 2019-06-18 DIAGNOSIS — M1990 Unspecified osteoarthritis, unspecified site: Secondary | ICD-10-CM | POA: Insufficient documentation

## 2019-06-18 DIAGNOSIS — Z7984 Long term (current) use of oral hypoglycemic drugs: Secondary | ICD-10-CM | POA: Insufficient documentation

## 2019-06-18 DIAGNOSIS — E785 Hyperlipidemia, unspecified: Secondary | ICD-10-CM | POA: Insufficient documentation

## 2019-06-18 DIAGNOSIS — Z952 Presence of prosthetic heart valve: Secondary | ICD-10-CM | POA: Insufficient documentation

## 2019-06-18 DIAGNOSIS — Z7902 Long term (current) use of antithrombotics/antiplatelets: Secondary | ICD-10-CM | POA: Insufficient documentation

## 2019-06-18 NOTE — Telephone Encounter (Signed)
Spoke with Katherine [name] regarding cardiac rehab orientation. Patient confirms that she is planning on attending orientation tomorrow. Katherine [name] was reminded to bring her medication list and wear a comfortable walking shoe. Katherine [name] gave me the number of her niece Katherine [name] who dispenses her medications. I reviewed Katherine [name]'s medications with her niece who says she is taking her medications as prescribed. Will complete's Katherine [name]'s health history in person on 06/19/19.Katherine Lighter, RN,BSN 06/18/2019 11:02 AM

## 2019-06-19 ENCOUNTER — Other Ambulatory Visit: Payer: Self-pay

## 2019-06-19 ENCOUNTER — Encounter (HOSPITAL_COMMUNITY)
Admission: RE | Admit: 2019-06-19 | Discharge: 2019-06-19 | Disposition: A | Discharge status: Home / Self Care | Payer: Medicare Other | Source: Ambulatory Visit | Attending: Cardiovascular Disease | Admitting: Cardiovascular Disease

## 2019-06-19 VITALS — BP 118/70 | HR 70 | Ht <= 58 in | Wt 148.1 lb

## 2019-06-19 DIAGNOSIS — Z955 Presence of coronary angioplasty implant and graft: Secondary | ICD-10-CM

## 2019-06-19 DIAGNOSIS — Z952 Presence of prosthetic heart valve: Secondary | ICD-10-CM

## 2019-06-20 ENCOUNTER — Encounter (HOSPITAL_COMMUNITY): Payer: Self-pay

## 2019-06-20 NOTE — Progress Notes (Signed)
Intermittent PVC's noted during walk test. Patient asymptomatic. ECG tracings faxed to [facility] at [facility] for review.Gladstone Lighter, RN,BSN 06/20/2019 12:04 PM

## 2019-06-20 NOTE — Progress Notes (Signed)
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  albuterol (VENTOLIN HFA) 108 (90 Base) MCG/ACT inhaler, Inhale 1 puff into the lungs 2 (two) times daily as needed for wheezing or shortness of breath. , Disp: , Rfl:  .  ascorbic acid (VITAMIN C) 500 MG tablet, Take 500 mg by mouth daily., Disp: , Rfl:  .  aspirin EC 81 MG tablet, Take 81 mg by mouth daily. , Disp: , Rfl:  .  benztropine (COGENTIN) 0.5 MG tablet, Take 0.5 mg by mouth 2 (two) times daily.  , Disp: , Rfl:  .  calcium-vitamin D (OSCAL WITH D) 500-200 MG-UNIT tablet, Take 1 tablet by mouth 3 (three) times daily., Disp: 270 tablet, Rfl: 3 .  cetirizine (ZYRTEC) 10 MG tablet, TAKE 1 TABLET BY MOUTH DAILY, Disp: 30 tablet, Rfl: 0 .  clindamycin (CLEOCIN) 300 MG capsule, Take 600 mg (2 capsules) by mouth 60 minutes prior to all dental procedures., Disp: 4 capsule, Rfl: 3 .  clopidogrel (PLAVIX) 75 MG tablet, Take 1 tablet (75 mg total) by mouth daily., Disp: 90 tablet, Rfl: 3 .  diclofenac sodium (VOLTAREN) 1 % GEL, Apply 2-4 g topically 4 (four) times daily as needed (Pain). , Disp: , Rfl:  .  fluticasone (FLONASE) 50 MCG/ACT nasal spray, Place 1 spray into both nostrils daily as needed for allergies. , Disp: , Rfl:  .  gabapentin (NEURONTIN) 100 MG capsule, Take 100 mg by mouth 3 (three) times daily. , Disp: , Rfl:  .  hydrOXYzine (ATARAX/VISTARIL) 10 MG tablet, Take 1 tablet (10  mg total) by mouth 2 (two) times daily as needed., Disp: 30 tablet, Rfl: 11 .  ipratropium (ATROVENT) 0.06 % nasal spray, USE 1 SPRAY IN EACH NOSTRIL THREE TIMES DAILY, Disp: 30 mL, Rfl: 11 .  metFORMIN (GLUCOPHAGE) 500 MG tablet, Take 500 mg by mouth 2 (two) times daily. , Disp: , Rfl:  .  Multiple Vitamin (MULTIVITAMIN WITH MINERALS) TABS tablet, Take 1 tablet by mouth daily., Disp: , Rfl:  .  ONETOUCH DELICA LANCETS 78G MISC, CHECK BLOOD SUGAR ONCE DAILY AS DIRECTED, Disp: 100 each, Rfl: 0 .  pantoprazole (PROTONIX) 40 MG tablet, TAKE 1 TABLET BY MOUTH DAILY, Disp: 90 tablet, Rfl: 0 .  Potassium Chloride ER 20 MEQ TBCR, Take 20 mEq by mouth 2 (two) times daily. , Disp: , Rfl: 1 .  risperiDONE (RISPERDAL) 3 MG tablet, Take 3 mg by mouth at bedtime. , Disp: , Rfl:  .  rosuvastatin (CRESTOR) 40 MG tablet, TAKE 1 TABLET(40 MG) BY MOUTH DAILY AT 6 PM, Disp: 90 tablet, Rfl: 3 .  UREA 20 INTENSIVE HYDRATING 20 % cream, Apply 1 application topically daily as needed (dry skin). For dry skin, Disp: , Rfl: 6  Past Medical History: Past Medical History:  Diagnosis Date  . ANXIETY 07/31/2007  . CAD (coronary artery disease)   . Coronary artery disease involving native coronary artery of native  heart with angina pectoris (HCC)   . DEPRESSION 09/05/2006  . DIABETES MELLITUS, TYPE II   . Diabetic foot ulcer (HCC) 10/26/2014  . DIVERTICULOSIS, COLON 09/05/2006  . FIBROMYALGIA 05/10/2009  . GERD 09/05/2006  . HYPERLIPIDEMIA 09/05/2006  . HYPERTENSION 09/05/2006  . MRSA infection 10/26/2014  . OSTEOARTHRITIS 09/05/2006  . OSTEOPOROSIS 07/31/2007  . 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 Severe aortic stenosis    s/p TAVR  . Unspecified hearing loss 10/09/2007    Tobacco Use: Social History   Tobacco Use  Smoking Status Never Smoker  Smokeless Tobacco Never Used    Labs: Recent Review Flowsheet Data    Labs  for ITP Cardiac and Pulmonary Rehab Latest Ref Rng & Units 04/18/2019 04/22/2019 04/22/2019 04/22/2019 04/22/2019   Cholestrol 0 - 200 mg/dL - - - - -   LDLCALC 0 - 99 mg/dL - - - - -   LDLDIRECT mg/dL - - - - -   HDL >63.87 mg/dL - - - - -   Trlycerides 0.0 - 149.0 mg/dL - - - - -   Hemoglobin A1c 4.8 - 5.6 % 5.7(H) - - - -   PHART 7.350 - 7.450 7.443 - 7.326(L) - -   PCO2ART 32.0 - 48.0 mmHg 35.8 - 51.7(H) - -   HCO3 20.0 - 28.0 mmol/L 24.1 - 27.0 - -   TCO2 22 - 32 mmol/L - 29 29 29 25    ACIDBASEDEF 0.0 - 2.0 mmol/L - - - - -   O2SAT % 98.1 - 100.0 - -      Capillary Blood Glucose: Lab Results  Component Value Date   GLUCAP 108 (H) 04/24/2019   GLUCAP 135 (H) 04/23/2019   GLUCAP 106 (H) 04/23/2019   GLUCAP 97 04/22/2019   GLUCAP 101 (H) 04/18/2019     Exercise Target Goals: Exercise Program Goal: Individual exercise prescription set using results from initial 6 min walk test and THRR while considering  patient's activity barriers and safety.   Exercise Prescription Goal: Starting with aerobic activity 30 plus minutes a day, 3 days per week for initial exercise prescription. Provide home exercise prescription and guidelines that participant acknowledges understanding prior to discharge.  Activity Barriers & Risk Stratification: Activity Barriers & Cardiac Risk Stratification - 06/19/19 1455      Activity Barriers & Cardiac Risk Stratification   Activity Barriers  Fibromyalgia;Deconditioning    Cardiac Risk Stratification  High       6 Minute Walk: 6 Minute Walk    Row Name 06/19/19 1453         6 Minute Walk   Phase  Initial     Distance  685 feet     Walk Time  6 minutes     # of Rest Breaks  1     MPH  1.3     METS  1.3     RPE  12     Perceived Dyspnea   1     VO2 Peak  4.7     Symptoms  Yes (comment)     Comments  Mild SOB +1, Pt took one 27 second rest break     Resting HR  70 bpm     Resting BP  118/70     Resting Oxygen Saturation   97 %      Exercise Oxygen Saturation  during  6 min walk  97 %     Max Ex. HR  96 bpm     Max Ex. BP  144/82     2 Minute Post BP  118/68        Oxygen Initial Assessment:   Oxygen Re-Evaluation:   Oxygen Discharge (Final Oxygen Re-Evaluation):   Initial Exercise Prescription: Initial Exercise Prescription - 06/19/19 1400      Date of Initial Exercise RX and Referring Provider   Date  06/19/19    Referring Provider  [name] MD    Expected Discharge Date  08/15/19      NuStep   Level  1    SPM  75    Minutes  25    METs  1.8      Prescription Details   Frequency (times per week)  3x    Duration  Progress to 30 minutes of continuous aerobic without signs/symptoms of physical distress      Intensity   THRR 40-80% of Max Heartrate  59-117    Ratings of Perceived Exertion  11-13    Perceived Dyspnea  0-4      Progression   Progression  Continue progressive overload as per policy without signs/symptoms or physical distress.      Resistance Training   Training Prescription  Yes    Weight  2lbs    Reps  10-15       Perform Capillary Blood Glucose checks as needed.  Exercise Prescription Changes:   Exercise Comments:   Exercise Goals and Review: Exercise Goals    Row Name 06/19/19 1457             Exercise Goals   Increase Physical Activity  Yes       Intervention  Provide advice, education, support and counseling about physical activity/exercise needs.;Develop an individualized exercise prescription for aerobic and resistive training based on initial evaluation findings, risk stratification, comorbidities and participant's personal goals.       Expected Outcomes  Short Term: Attend rehab on a regular basis to increase amount of physical activity.;Long Term: Add in home exercise to make exercise part of routine and to increase amount of physical activity.;Long Term: Exercising regularly at least 3-5 days a week.       Increase Strength and Stamina  Yes        Intervention  Provide advice, education, support and counseling about physical activity/exercise needs.;Develop an individualized exercise prescription for aerobic and resistive training based on initial evaluation findings, risk stratification, comorbidities and participant's personal goals.       Expected Outcomes  Short Term: Increase workloads from initial exercise prescription for resistance, speed, and METs.;Short Term: Perform resistance training exercises routinely during rehab and add in resistance training at home;Long Term: Improve cardiorespiratory fitness, muscular endurance and strength as measured by increased METs and functional capacity ( )       Able to understand and use rate of perceived exertion (RPE) scale  Yes       Intervention  Provide education and explanation on how to use RPE scale       Expected Outcomes  Short Term: Able to use RPE daily in rehab to express subjective intensity level;Long Term:  Able to use RPE to guide intensity level when exercising independently       Knowledge and understanding of Target Heart Rate Range (THRR)  Yes       Intervention  Provide education and explanation of THRR including how the numbers  were predicted and where they are located for reference       Expected Outcomes  Short Term: Able to state/look up THRR;Long Term: Able to use THRR to govern intensity when exercising independently;Short Term: Able to use daily as guideline for intensity in rehab       Able to check pulse independently  Yes       Intervention  Provide education and demonstration on how to check pulse in carotid and radial arteries.;Review the importance of being able to check your own pulse for safety during independent exercise       Expected Outcomes  Short Term: Able to explain why pulse checking is important during independent exercise;Long Term: Able to check pulse independently and accurately       Understanding of Exercise Prescription  Yes       Intervention   Provide education, explanation, and written materials on patient's individual exercise prescription       Expected Outcomes  Short Term: Able to explain program exercise prescription;Long Term: Able to explain home exercise prescription to exercise independently          Exercise Goals Re-Evaluation :    Discharge Exercise Prescription (Final Exercise Prescription Changes):   Nutrition:  Target Goals: Understanding of nutrition guidelines, daily intake of sodium 1500mg , cholesterol 200mg , calories 30% from fat and 7% or less from saturated fats, daily to have 5 or more servings of fruits and vegetables.  Biometrics: Pre Biometrics - 06/19/19 1455      Pre Biometrics   Height  4' 9.7" (1.466 m)    Weight  67.2 kg    Waist Circumference  44.5 inches    Hip Circumference  45.5 inches    Waist to Hip Ratio  0.98 %    BMI (Calculated)  31.27    Triceps Skinfold  22 mm    % Body Fat  44.9 %    Grip Strength  18 kg    Flexibility  0 in    Single Leg Stand  0 seconds        Nutrition Therapy Plan and Nutrition Goals:   Nutrition Assessments:   Nutrition Goals Re-Evaluation:   Nutrition Goals Discharge (Final Nutrition Goals Re-Evaluation):   Psychosocial: Target Goals: Acknowledge presence or absence of significant depression and/or stress, maximize coping skills, provide positive support system. Participant is able to verbalize types and ability to use techniques and skills needed for reducing stress and depression.  Initial Review & Psychosocial Screening: Initial Psych Review & Screening - 06/20/19 0840      Initial Review   Current issues with  Current Anxiety/Panic;History of Depression;Current Psychotropic Meds      Family Dynamics   Good Support System?  Yes   [name] lives alone. [name] has assistance from her daily care by a home health aide that assists her 6 days a week. [name] has firends who help with filling her pill box and other needs. [name] also has a sister  available if needed   Comments  [name] has short term memory deficits. Will assist with patient as needed.      Barriers   Psychosocial barriers to participate in program  The patient should benefit from training in stress management and relaxation.      Screening Interventions   Interventions  Encouraged to exercise;Provide feedback about the scores to participant;To provide support and resources with identified psychosocial needs    Expected Outcomes  Short Term goal: Utilizing psychosocial counselor, staff and physician  to assist with identification of specific Stressors or current issues interfering with healing process. Setting desired goal for each stressor or current issue identified.;Long Term Goal: Stressors or current issues are controlled or eliminated.;Short Term goal: Identification and review with participant of any Quality of Life or Depression concerns found by scoring the questionnaire.;Long Term goal: The participant improves quality of Life and PHQ9 Scores as seen by post scores and/or verbalization of changes       Quality of Life Scores: Quality of Life - 06/20/19 1017      Quality of Life   Select  --   Reviewed quality of life with the patient. Lives alone but has support from friends, family and the community     Scores of 19 and below usually indicate a poorer quality of life in these areas.  A difference of  2-3 points is a clinically meaningful difference.  A difference of 2-3 points in the total score of the Quality of Life Index has been associated with significant improvement in overall quality of life, self-image, physical symptoms, and general health in studies assessing change in quality of life.  PHQ-9: Recent Review Flowsheet Data    Depression screen [facility] 2/9 06/20/2019 06/15/2015 03/30/2014 08/08/2013 11/07/2012   Decreased Interest 0 0 0 0 0   Down, Depressed, Hopeless 0 1 0 1  0   PHQ - 2 Score 0 1 0 1 0     Interpretation of Total Score  Total Score  Depression Severity:  1-4 = Minimal depression, 5-9 = Mild depression, 10-14 = Moderate depression, 15-19 = Moderately severe depression, 20-27 = Severe depression   Psychosocial Evaluation and Intervention:   Psychosocial Re-Evaluation:   Psychosocial Discharge (Final Psychosocial Re-Evaluation):   Vocational Rehabilitation: Provide vocational rehab assistance to qualifying candidates.   Vocational Rehab Evaluation & Intervention: Vocational Rehab - 06/20/19 1019      Initial Vocational Rehab Evaluation & Intervention   Assessment shows need for Vocational Rehabilitation  No   Jeannene Patella is disabled and does not need vocational rehab at this time.      Education: Education Goals: Education classes will be provided on a weekly basis, covering required topics. Participant will state understanding/return demonstration of topics presented.  Learning Barriers/Preferences: Learning Barriers/Preferences - 06/19/19 1520      Learning Barriers/Preferences   Learning Barriers  --   Mild Dementia   Learning Preferences  Skilled Demonstration;Individual Instruction       Education Topics: Hypertension, Hypertension Reduction -Define heart disease and high blood pressure. Discus how high blood pressure affects the body and ways to reduce high blood pressure.   Exercise and Your Heart -Discuss why it is important to exercise, the FITT principles of exercise, normal and abnormal responses to exercise, and how to exercise safely.   Angina -Discuss definition of angina, causes of angina, treatment of angina, and how to decrease risk of having angina.   Cardiac Medications -Review what the following cardiac medications are used for, how they affect the body, and side effects that may occur when taking the medications.  Medications include Aspirin, Beta blockers, calcium channel blockers, ACE Inhibitors, angiotensin receptor blockers, diuretics, digoxin, and  antihyperlipidemics.   Congestive Heart Failure -Discuss the definition of CHF, how to live with CHF, the signs and symptoms of CHF, and how keep track of weight and sodium intake.   Heart Disease and Intimacy -Discus the effect sexual activity has on the heart, how changes occur during intimacy as we age, and  safety during sexual activity.   Smoking Cessation / COPD -Discuss different methods to quit smoking, the health benefits of quitting smoking, and the definition of COPD.   Nutrition I: Fats -Discuss the types of cholesterol, what cholesterol does to the heart, and how cholesterol levels can be controlled.   Nutrition II: Labels -Discuss the different components of food labels and how to read food label   Heart Parts/Heart Disease and PAD -Discuss the anatomy of the heart, the pathway of blood circulation through the heart, and these are affected by heart disease.   Stress I: Signs and Symptoms -Discuss the causes of stress, how stress may lead to anxiety and depression, and ways to limit stress.   Stress II: Relaxation -Discuss different types of relaxation techniques to limit stress.   Warning Signs of Stroke / TIA -Discuss definition of a stroke, what the signs and symptoms are of a stroke, and how to identify when someone is having stroke.   Knowledge Questionnaire Score: Knowledge Questionnaire Score - 06/19/19 1509      Knowledge Questionnaire Score   Pre Score  19/24       Core Components/Risk Factors/Patient Goals at Admission: Personal Goals and Risk Factors at Admission - 06/20/19 1020      Core Components/Risk Factors/Patient Goals on Admission    Weight Management  Yes;Obesity    Intervention  Weight Management: Develop a combined nutrition and exercise program designed to reach desired caloric intake, while maintaining appropriate intake of nutrient and fiber, sodium and fats, and appropriate energy expenditure required for the weight  goal.;Weight Management: Provide education and appropriate resources to help participant work on and attain dietary goals.;Weight Management/Obesity: Establish reasonable short term and long term weight goals.;Obesity: Provide education and appropriate resources to help participant work on and attain dietary goals.    Expected Outcomes  Short Term: Continue to assess and modify interventions until short term weight is achieved;Long Term: Adherence to nutrition and physical activity/exercise program aimed toward attainment of established weight goal;Weight Maintenance: Understanding of the daily nutrition guidelines, which includes 25-35% calories from fat, 7% or less cal from saturated fats, less than 200mg  cholesterol, less than 1.5gm of sodium, & 5 or more servings of fruits and vegetables daily;Weight Loss: Understanding of general recommendations for a balanced deficit meal plan, which promotes 1-2 lb weight loss per week and includes a negative energy balance of [id number] kcal/d;Understanding recommendations for meals to include 15-35% energy as protein, 25-35% energy from fat, 35-60% energy from carbohydrates, less than 200mg  of dietary cholesterol, 20-35 gm of total fiber daily;Understanding of distribution of calorie intake throughout the day with the consumption of 4-5 meals/snacks    Diabetes  Yes    Intervention  Provide education about signs/symptoms and action to take for hypo/hyperglycemia.;Provide education about proper nutrition, including hydration, and aerobic/resistive exercise prescription along with prescribed medications to achieve blood glucose in normal ranges: Fasting glucose 65-99 mg/dL    Expected Outcomes  Long Term: Attainment of HbA1C < 7%.;Short Term: Participant verbalizes understanding of the signs/symptoms and immediate care of hyper/hypoglycemia, proper foot care and importance of medication, aerobic/resistive exercise and nutrition plan for blood glucose control.     Hypertension  Yes    Intervention  Provide education on lifestyle modifcations including regular physical activity/exercise, weight management, moderate sodium restriction and increased consumption of fresh fruit, vegetables, and low fat dairy, alcohol moderation, and smoking cessation.;Monitor prescription use compliance.    Expected Outcomes  Short Term: Continued assessment and intervention  until BP is < 140/5490mm HG in hypertensive participants. < 130/4380mm HG in hypertensive participants with diabetes, heart failure or chronic kidney disease.;Long Term: Maintenance of blood pressure at goal levels.    Lipids  Yes    Intervention  Provide education and support for participant on nutrition & aerobic/resistive exercise along with prescribed medications to achieve LDL 70mg , HDL >40mg .    Expected Outcomes  Short Term: Participant states understanding of desired cholesterol values and is compliant with medications prescribed. Participant is following exercise prescription and nutrition guidelines.;Long Term: Cholesterol controlled with medications as prescribed, with individualized exercise RX and with personalized nutrition plan. Value goals: LDL < 70mg , HDL > 40 mg.    Stress  Yes    Intervention  Offer individual and/or small group education and counseling on adjustment to heart disease, stress management and health-related lifestyle change. Teach and support self-help strategies.    Expected Outcomes  Short Term: Participant demonstrates changes in health-related behavior, relaxation and other stress management skills, ability to obtain effective social support, and compliance with psychotropic medications if prescribed.;Long Term: Emotional wellbeing is indicated by absence of clinically significant psychosocial distress or social isolation.       Core Components/Risk Factors/Patient Goals Review:    Core Components/Risk Factors/Patient Goals at Discharge (Final Review):    ITP Comments: ITP  Comments    Row Name 06/19/19 1453           ITP Comments  Dr. Armanda Magicraci Turner, Medical Director          Comments: Elita QuickPam attended orientation on 06/20/2019 to review rules and guidelines for program.  Completed 6 minute walk test, Intitial ITP, and exercise prescription.  VSS. Telemetry-Sinus Rhythm with intermittent .  Asymptomatic. Safety measures and social distancing in place per CDC guidelines. Will [facility]. Patient alert and oriented times 2. [name] said that she could not remember the name of the current President. Once I reminded [name] she  remembered. [name] did occasionally say things out of context. Patient is very pleasant and know she is in cardiac rehab to strengthen her heart.Gladstone LighterMaria Shakeema Lippman, RN,BSN 06/20/2019 10:32 AM

## 2019-06-23 ENCOUNTER — Encounter (HOSPITAL_COMMUNITY)
Admission: RE | Admit: 2019-06-23 | Discharge: 2019-06-23 | Disposition: A | Discharge status: Home / Self Care | Payer: Medicare Other | Source: Ambulatory Visit | Attending: Cardiovascular Disease | Admitting: Cardiovascular Disease

## 2019-06-23 ENCOUNTER — Other Ambulatory Visit: Payer: Self-pay

## 2019-06-23 DIAGNOSIS — H9190 Unspecified hearing loss, unspecified ear: Secondary | ICD-10-CM | POA: Diagnosis not present

## 2019-06-23 DIAGNOSIS — Z952 Presence of prosthetic heart valve: Secondary | ICD-10-CM | POA: Diagnosis not present

## 2019-06-23 DIAGNOSIS — E119 Type 2 diabetes mellitus without complications: Secondary | ICD-10-CM | POA: Diagnosis not present

## 2019-06-23 DIAGNOSIS — Z7902 Long term (current) use of antithrombotics/antiplatelets: Secondary | ICD-10-CM | POA: Diagnosis not present

## 2019-06-23 DIAGNOSIS — I2510 Atherosclerotic heart disease of native coronary artery without angina pectoris: Secondary | ICD-10-CM | POA: Diagnosis not present

## 2019-06-23 DIAGNOSIS — Z7982 Long term (current) use of aspirin: Secondary | ICD-10-CM | POA: Diagnosis not present

## 2019-06-23 DIAGNOSIS — M797 Fibromyalgia: Secondary | ICD-10-CM | POA: Diagnosis not present

## 2019-06-23 DIAGNOSIS — E785 Hyperlipidemia, unspecified: Secondary | ICD-10-CM | POA: Diagnosis not present

## 2019-06-23 DIAGNOSIS — Z955 Presence of coronary angioplasty implant and graft: Secondary | ICD-10-CM

## 2019-06-23 DIAGNOSIS — Z8631 Personal history of diabetic foot ulcer: Secondary | ICD-10-CM | POA: Diagnosis not present

## 2019-06-23 DIAGNOSIS — Z7984 Long term (current) use of oral hypoglycemic drugs: Secondary | ICD-10-CM | POA: Diagnosis not present

## 2019-06-23 DIAGNOSIS — M1990 Unspecified osteoarthritis, unspecified site: Secondary | ICD-10-CM | POA: Diagnosis not present

## 2019-06-23 DIAGNOSIS — K219 Gastro-esophageal reflux disease without esophagitis: Secondary | ICD-10-CM | POA: Diagnosis not present

## 2019-06-23 DIAGNOSIS — I10 Essential (primary) hypertension: Secondary | ICD-10-CM | POA: Diagnosis not present

## 2019-06-23 DIAGNOSIS — Z79899 Other long term (current) drug therapy: Secondary | ICD-10-CM | POA: Diagnosis not present

## 2019-06-23 LAB — GLUCOSE, CAPILLARY
Glucose-Capillary: 103 mg/dL — ABNORMAL HIGH (ref 70–99)
Glucose-Capillary: 112 mg/dL — ABNORMAL HIGH (ref 70–99)

## 2019-06-23 NOTE — Progress Notes (Signed)
Daily Session Note  Patient Details  Name: Katherine [name] MRN: [id number] Date of Birth: 03/07/1945 Referring Provider:     [place] from 06/19/2019 in [place]  Referring Provider  Sherren Mocha MD      Encounter Date: 06/23/2019  Check In: Session Check In - 06/23/19 1459      Check-In   Supervising physician immediately available to respond to emergencies  [facility] immediately available    Physician(s)  Dr. Tyrell Antonio    Location  [facility]    Staff Present  Barnet Pall, RN, BSN;David Makemson MS, EP-C, CCRP;Olinty Celesta Aver, MS, ACSM CEP, Exercise Physiologist;Portia Rollene Rotunda, RN, BSN    Virtual Visit  No    Medication changes reported      No    Fall or balance concerns reported     No    Tobacco Cessation  No Change    Warm-up and Cool-down  Performed on first and last piece of equipment    Resistance Training Performed  Yes    VAD Patient?  No    PAD/SET Patient?  No      Pain Assessment   Currently in Pain?  No/denies    Pain Score  0-No pain    Multiple Pain Sites  No       Capillary Blood Glucose: Results for orders placed or performed during the hospital encounter of 06/23/19 (from the past 24 hour(s))  Glucose, capillary     Status: Abnormal   Collection Time: 06/23/19  3:06 PM  Result Value Ref Range   Glucose-Capillary 103 (H) 70 - 99 mg/dL  Glucose, capillary     Status: Abnormal   Collection Time: 06/23/19  3:50 PM  Result Value Ref Range   Glucose-Capillary 112 (H) 70 - 99 mg/dL    Exercise Prescription Changes - 06/23/19 1518      Response to Exercise   Blood Pressure (Admit)  130/72    Blood Pressure (Exercise)  138/82    Blood Pressure (Exit)  128/72    Heart Rate (Admit)  85 bpm    Heart Rate (Exercise)  87 bpm    Heart Rate (Exit)  76 bpm    Rating of Perceived Exertion (Exercise)  13    Symptoms  none    Comments  Off to a good start with exercise.      Duration  Progress to 30 minutes of  aerobic without signs/symptoms of physical distress    Intensity  THRR unchanged      Progression   Progression  Continue to progress workloads to maintain intensity without signs/symptoms of physical distress.    Average METs  1.4      Resistance Training   Training Prescription  Yes    Weight  1lb    Reps  10-15    Time  10 Minutes      Interval Training   Interval Training  No      NuStep   Level  1    SPM  75    Minutes  25    METs  1.4       Social History   Tobacco Use  Smoking Status Never Smoker  Smokeless Tobacco Never Used    Goals Met:  No report of cardiac concerns or symptoms  Goals Unmet:  Not Applicable  Comments: Katherine [name] started cardiac rehab today.  Pt tolerated light exercise without difficulty. VSS, telemetry-Sinus Rhythm , asymptomatic.  Medication list reconciled. Pt denies barriers to medicaiton compliance.  PSYCHOSOCIAL ASSESSMENT:  PHQ-0. Pt exhibits positive coping skills, hopeful outlook with supportive friends and community network. No psychosocial needs identified at this time, no psychosocial interventions necessary.    Pt enjoys watching TV and participating in bible study  Pt oriented to exercise equipment and routine.    Understanding verbalized. Did not use hand weights after patient disclosed that she has problems with her left shoulder. Patient is pleasant and deconditioned. Patient assisted to her ride post exercise. Will continue to monitor the patient throughout  the program.Katherine Jeanbaptiste Venetia Maxon, RN,BSN 06/24/2019 11:14 AM    Dr. Fransico Him is Medical Director for Cardiac Rehab at [facility].

## 2019-06-25 ENCOUNTER — Other Ambulatory Visit: Payer: Self-pay

## 2019-06-25 ENCOUNTER — Encounter (HOSPITAL_COMMUNITY)
Admission: RE | Admit: 2019-06-25 | Discharge: 2019-06-25 | Disposition: A | Discharge status: Home / Self Care | Payer: Medicare Other | Source: Ambulatory Visit | Attending: Cardiovascular Disease | Admitting: Cardiovascular Disease

## 2019-06-25 DIAGNOSIS — Z952 Presence of prosthetic heart valve: Secondary | ICD-10-CM | POA: Diagnosis not present

## 2019-06-25 DIAGNOSIS — Z955 Presence of coronary angioplasty implant and graft: Secondary | ICD-10-CM

## 2019-06-25 LAB — GLUCOSE, CAPILLARY: Glucose-Capillary: 90 mg/dL (ref 70–99)

## 2019-06-27 ENCOUNTER — Other Ambulatory Visit: Payer: Self-pay

## 2019-06-27 ENCOUNTER — Encounter (HOSPITAL_COMMUNITY)
Admission: RE | Admit: 2019-06-27 | Discharge: 2019-06-27 | Disposition: A | Discharge status: Home / Self Care | Payer: Medicare Other | Source: Ambulatory Visit | Attending: Cardiovascular Disease | Admitting: Cardiovascular Disease

## 2019-06-27 DIAGNOSIS — Z955 Presence of coronary angioplasty implant and graft: Secondary | ICD-10-CM

## 2019-06-27 DIAGNOSIS — Z952 Presence of prosthetic heart valve: Secondary | ICD-10-CM

## 2019-07-02 ENCOUNTER — Other Ambulatory Visit: Payer: Self-pay

## 2019-07-02 ENCOUNTER — Encounter (HOSPITAL_COMMUNITY)
Admission: RE | Admit: 2019-07-02 | Discharge: 2019-07-02 | Disposition: A | Discharge status: Home / Self Care | Payer: Medicare Other | Source: Ambulatory Visit | Attending: Cardiovascular Disease | Admitting: Cardiovascular Disease

## 2019-07-02 DIAGNOSIS — Z7902 Long term (current) use of antithrombotics/antiplatelets: Secondary | ICD-10-CM | POA: Insufficient documentation

## 2019-07-02 DIAGNOSIS — E119 Type 2 diabetes mellitus without complications: Secondary | ICD-10-CM | POA: Diagnosis not present

## 2019-07-02 DIAGNOSIS — I10 Essential (primary) hypertension: Secondary | ICD-10-CM | POA: Diagnosis not present

## 2019-07-02 DIAGNOSIS — H9190 Unspecified hearing loss, unspecified ear: Secondary | ICD-10-CM | POA: Insufficient documentation

## 2019-07-02 DIAGNOSIS — K219 Gastro-esophageal reflux disease without esophagitis: Secondary | ICD-10-CM | POA: Diagnosis not present

## 2019-07-02 DIAGNOSIS — M1990 Unspecified osteoarthritis, unspecified site: Secondary | ICD-10-CM | POA: Insufficient documentation

## 2019-07-02 DIAGNOSIS — I2510 Atherosclerotic heart disease of native coronary artery without angina pectoris: Secondary | ICD-10-CM | POA: Insufficient documentation

## 2019-07-02 DIAGNOSIS — Z7984 Long term (current) use of oral hypoglycemic drugs: Secondary | ICD-10-CM | POA: Diagnosis not present

## 2019-07-02 DIAGNOSIS — E785 Hyperlipidemia, unspecified: Secondary | ICD-10-CM | POA: Insufficient documentation

## 2019-07-02 DIAGNOSIS — Z8631 Personal history of diabetic foot ulcer: Secondary | ICD-10-CM | POA: Insufficient documentation

## 2019-07-02 DIAGNOSIS — Z79899 Other long term (current) drug therapy: Secondary | ICD-10-CM | POA: Insufficient documentation

## 2019-07-02 DIAGNOSIS — Z955 Presence of coronary angioplasty implant and graft: Secondary | ICD-10-CM

## 2019-07-02 DIAGNOSIS — Z7982 Long term (current) use of aspirin: Secondary | ICD-10-CM | POA: Diagnosis not present

## 2019-07-02 DIAGNOSIS — Z952 Presence of prosthetic heart valve: Secondary | ICD-10-CM

## 2019-07-02 DIAGNOSIS — M797 Fibromyalgia: Secondary | ICD-10-CM | POA: Insufficient documentation

## 2019-07-02 LAB — GLUCOSE, CAPILLARY: Glucose-Capillary: 105 mg/dL — ABNORMAL HIGH (ref 70–99)

## 2019-07-03 NOTE — Progress Notes (Signed)
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  albuterol (VENTOLIN HFA) 108 (90 Base) MCG/ACT inhaler, Inhale 1 puff into the lungs 2 (two) times daily as needed for wheezing or shortness of breath. , Disp: , Rfl:  .  ascorbic acid (VITAMIN C) 500 MG tablet, Take 500 mg by mouth daily., Disp: , Rfl:  .  aspirin EC 81 MG tablet, Take 81 mg by mouth daily. , Disp: , Rfl:  .  benztropine (COGENTIN) 0.5 MG tablet, Take 0.5 mg by mouth 2 (two) times daily.  , Disp: , Rfl:  .  calcium-vitamin D (OSCAL WITH D) 500-200 MG-UNIT tablet, Take 1 tablet by mouth 3 (three) times daily., Disp: 270 tablet, Rfl: 3 .  cetirizine (ZYRTEC) 10 MG tablet, TAKE 1 TABLET BY MOUTH DAILY, Disp: 30 tablet, Rfl: 0 .  clindamycin (CLEOCIN) 300 MG capsule, Take 600 mg (2 capsules) by mouth 60 minutes prior to all dental procedures., Disp: 4 capsule, Rfl: 3 .  clopidogrel (PLAVIX) 75 MG tablet, Take 1 tablet (75 mg total) by mouth daily., Disp: 90 tablet, Rfl: 3 .  diclofenac sodium (VOLTAREN) 1 % GEL, Apply 2-4 g topically 4 (four) times daily as needed (Pain). , Disp: , Rfl:  .  fluticasone (FLONASE) 50 MCG/ACT nasal spray, Place 1 spray into both nostrils daily as needed for allergies. , Disp: , Rfl:  .  gabapentin (NEURONTIN) 100 MG capsule, Take 100 mg by mouth 3 (three) times daily. , Disp: , Rfl:  .  hydrOXYzine (ATARAX/VISTARIL) 10 MG tablet, Take 1 tablet (10  mg total) by mouth 2 (two) times daily as needed., Disp: 30 tablet, Rfl: 11 .  ipratropium (ATROVENT) 0.06 % nasal spray, USE 1 SPRAY IN EACH NOSTRIL THREE TIMES DAILY, Disp: 30 mL, Rfl: 11 .  metFORMIN (GLUCOPHAGE) 500 MG tablet, Take 500 mg by mouth 2 (two) times daily. , Disp: , Rfl:  .  Multiple Vitamin (MULTIVITAMIN WITH MINERALS) TABS tablet, Take 1 tablet by mouth daily., Disp: , Rfl:  .  ONETOUCH DELICA LANCETS 78G MISC, CHECK BLOOD SUGAR ONCE DAILY AS DIRECTED, Disp: 100 each, Rfl: 0 .  pantoprazole (PROTONIX) 40 MG tablet, TAKE 1 TABLET BY MOUTH DAILY, Disp: 90 tablet, Rfl: 0 .  Potassium Chloride ER 20 MEQ TBCR, Take 20 mEq by mouth 2 (two) times daily. , Disp: , Rfl: 1 .  risperiDONE (RISPERDAL) 3 MG tablet, Take 3 mg by mouth at bedtime. , Disp: , Rfl:  .  rosuvastatin (CRESTOR) 40 MG tablet, TAKE 1 TABLET(40 MG) BY MOUTH DAILY AT 6 PM, Disp: 90 tablet, Rfl: 3 .  UREA 20 INTENSIVE HYDRATING 20 % cream, Apply 1 application topically daily as needed (dry skin). For dry skin, Disp: , Rfl: 6  Past Medical History: Past Medical History:  Diagnosis Date  . ANXIETY 07/31/2007  . CAD (coronary artery disease)   . Coronary artery disease involving native coronary artery of native  heart with angina pectoris (HCC)   . DEPRESSION 09/05/2006  . DIABETES MELLITUS, TYPE II   . Diabetic foot ulcer (HCC) 10/26/2014  . DIVERTICULOSIS, COLON 09/05/2006  . FIBROMYALGIA 05/10/2009  . GERD 09/05/2006  . HYPERLIPIDEMIA 09/05/2006  . HYPERTENSION 09/05/2006  . MRSA infection 10/26/2014  . OSTEOARTHRITIS 09/05/2006  . OSTEOPOROSIS 07/31/2007  . 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 Severe aortic stenosis    s/p TAVR  . Unspecified hearing loss 10/09/2007    Tobacco Use: Social History   Tobacco Use  Smoking Status Never Smoker  Smokeless Tobacco Never Used    Labs: Recent Review Flowsheet Data    Labs  for ITP Cardiac and Pulmonary Rehab Latest Ref Rng & Units 04/18/2019 04/22/2019 04/22/2019 04/22/2019 04/22/2019   Cholestrol 0 - 200 mg/dL - - - - -   LDLCALC 0 - 99 mg/dL - - - - -   LDLDIRECT mg/dL - - - - -   HDL >29.56 mg/dL - - - - -   Trlycerides 0.0 - 149.0 mg/dL - - - - -   Hemoglobin A1c 4.8 - 5.6 % 5.7(H) - - - -   PHART 7.350 - 7.450 7.443 - 7.326(L) - -   PCO2ART 32.0 - 48.0 mmHg 35.8 - 51.7(H) - -   HCO3 20.0 - 28.0 mmol/L 24.1 - 27.0 - -   TCO2 22 - 32 mmol/L - ACIDBASEDEF 0.0 - 2.0 mmol/L - - - - -   O2SAT % 98.1 - 100.0 - -      Capillary Blood Glucose: Lab Results  Component Value Date   GLUCAP 105 (H) 07/02/2019   GLUCAP 90 06/25/2019   GLUCAP 112 (H) 06/23/2019   GLUCAP 103 (H) 06/23/2019   GLUCAP 108 (H) 04/24/2019     Exercise Target Goals: Exercise Program Goal: Individual exercise prescription set using results from initial 6 min walk test and THRR while considering  patient's activity barriers and safety.   Exercise Prescription Goal: Starting with aerobic activity 30 plus minutes a day, 3 days per week for initial exercise prescription. Provide home exercise prescription and guidelines that participant acknowledges understanding prior to discharge.  Activity Barriers & Risk Stratification: Activity Barriers & Cardiac Risk Stratification - 06/19/19 1455      Activity Barriers & Cardiac Risk Stratification   Activity Barriers  Fibromyalgia;Deconditioning    Cardiac Risk Stratification  High       6 Minute Walk: 6 Minute Walk    Row Name 06/19/19 1453         6 Minute Walk   Phase  Initial     Distance  685 feet     Walk Time  6 minutes     # of Rest Breaks  1     MPH  1.3     METS  1.3     RPE  12     Perceived Dyspnea   1     VO2 Peak  4.7     Symptoms  Yes (comment)     Comments  Mild SOB +1, Pt took one 27 second rest break     Resting HR  70 bpm     Resting BP  118/70     Resting Oxygen Saturation   97 %      Exercise Oxygen Saturation  during  6 min walk  97 %     Max Ex. HR  96 bpm     Max Ex. BP  144/82     2 Minute Post BP  118/68        Oxygen Initial Assessment:   Oxygen Re-Evaluation:   Oxygen Discharge (Final Oxygen Re-Evaluation):   Initial Exercise Prescription: Initial Exercise Prescription - 06/19/19 1400      Date of Initial Exercise RX and Referring Provider   Date  06/19/19    Referring Provider  [name] MD    Expected Discharge Date  08/15/19      NuStep   Level  1    SPM  75    Minutes  25    METs  1.8      Prescription Details   Frequency (times per week)  3x    Duration  Progress to 30 minutes of continuous aerobic without signs/symptoms of physical distress      Intensity   THRR 40-80% of Max Heartrate  59-117    Ratings of Perceived Exertion  11-13    Perceived Dyspnea  0-4      Progression   Progression  Continue progressive overload as per policy without signs/symptoms or physical distress.      Resistance Training   Training Prescription  Yes    Weight  2lbs    Reps  10-15       Perform Capillary Blood Glucose checks as needed.  Exercise Prescription Changes: Exercise Prescription Changes    Row Name 06/23/19 1518 06/27/19 1352           Response to Exercise   Blood Pressure (Admit)  130/72  124/60      Blood Pressure (Exercise)  138/82  136/64      Blood Pressure (Exit)  128/72  124/80      Heart Rate (Admit)  85 bpm  85 bpm      Heart Rate (Exercise)  87 bpm  99 bpm      Heart Rate (Exit)  76 bpm  78 bpm      Rating of Perceived Exertion (Exercise)  13  12      Perceived Dyspnea (Exercise)  --  0      Symptoms  none  None      Comments  Off to a good start with exercise.   None      Duration  Progress to 30 minutes of  aerobic without signs/symptoms of physical distress  Progress to 30 minutes of  aerobic without signs/symptoms of physical distress      Intensity  THRR unchanged  THRR unchanged        Progression    Progression  Continue to progress workloads to maintain intensity without signs/symptoms of physical distress.  Continue to progress workloads to maintain intensity without signs/symptoms of physical distress.      Average METs  1.4  1.5        Resistance Training   Training Prescription  Yes  No      Weight  1lb  --      Reps  10-15  --      Time  10 Minutes  --        Interval Training   Interval Training  No  No        NuStep   Level  1  2      SPM  75  75      Minutes  25  25      METs  1.4  1.5         Exercise Comments: Exercise Comments    Row Name 06/23/19 1609           Exercise Comments  Patient tolerated 1st session of exercise well without symptoms.          Exercise Goals and Review: Exercise Goals    Row Name 06/19/19 1457             Exercise Goals   Increase Physical Activity  Yes       Intervention  Provide advice, education, support and counseling about physical activity/exercise needs.;Develop an individualized exercise prescription for aerobic and resistive training based on initial evaluation findings, risk stratification, comorbidities and participant's personal goals.       Expected Outcomes  Short Term: Attend rehab on a regular basis to increase amount of physical activity.;Long Term: Add in home exercise to make exercise part of routine and to increase amount of physical activity.;Long Term: Exercising regularly at least 3-5 days a week.       Increase Strength and Stamina  Yes       Intervention  Provide advice, education, support and counseling about physical activity/exercise needs.;Develop an individualized exercise prescription for aerobic and resistive training based on initial evaluation findings, risk stratification, comorbidities and participant's personal goals.       Expected Outcomes  Short Term: Increase workloads from initial exercise prescription for resistance, speed, and METs.;Short Term: Perform resistance training exercises  routinely during rehab and add in resistance training at home;Long Term: Improve cardiorespiratory fitness, muscular endurance and strength as measured by increased METs and functional capacity ( )       Able to understand and use rate of perceived exertion (RPE) scale  Yes       Intervention  Provide education and explanation on how to use RPE scale       Expected Outcomes  Short Term: Able to use RPE daily in rehab to express subjective intensity level;Long Term:  Able to use RPE to guide intensity level when exercising independently       Knowledge and understanding of Target Heart Rate Range (THRR)  Yes       Intervention  Provide education and explanation of THRR including how the numbers were predicted and where they are located for reference       Expected Outcomes  Short Term: Able to state/look up THRR;Long Term: Able to use THRR to govern intensity when exercising independently;Short Term: Able to use daily as guideline for intensity in rehab       Able to check pulse independently  Yes       Intervention  Provide education and demonstration on how to check pulse in carotid and radial arteries.;Review the importance of being able to check your own pulse for safety during independent exercise       Expected Outcomes  Short Term: Able to explain why pulse checking is important during independent exercise;Long Term: Able to check pulse independently and accurately       Understanding of Exercise Prescription  Yes       Intervention  Provide education, explanation, and written materials on patient's individual exercise prescription       Expected Outcomes  Short Term: Able to explain program exercise prescription;Long Term: Able to explain home exercise prescription to exercise independently          Exercise Goals Re-Evaluation : Exercise Goals  Re-Evaluation    Row Name 06/23/19 1609             Exercise Goal Re-Evaluation   Exercise Goals Review  Increase Physical Activity        Comments  Patient tolerated low intensity exercise without symptoms.       Expected Outcomes  Increase workloads as tolerated to help improve cardiorespiratory fitness.           Discharge Exercise Prescription (Final Exercise Prescription Changes): Exercise Prescription Changes - 06/27/19 1352      Response to Exercise   Blood Pressure (Admit)  124/60    Blood Pressure (Exercise)  136/64    Blood Pressure (Exit)  124/80    Heart Rate (Admit)  85 bpm    Heart Rate (Exercise)  99 bpm    Heart Rate (Exit)  78 bpm    Rating of Perceived Exertion (Exercise)  12    Perceived Dyspnea (Exercise)  0    Symptoms  None    Comments  None    Duration  Progress to 30 minutes of  aerobic without signs/symptoms of physical distress    Intensity  THRR unchanged      Progression   Progression  Continue to progress workloads to maintain intensity without signs/symptoms of physical distress.    Average METs  1.5      Resistance Training   Training Prescription  No      Interval Training   Interval Training  No      NuStep   Level  2    SPM  75    Minutes  25    METs  1.5       Nutrition:  Target Goals: Understanding of nutrition guidelines, daily intake of sodium 1500mg , cholesterol 200mg , calories 30% from fat and 7% or less from saturated fats, daily to have 5 or more servings of fruits and vegetables.  Biometrics: Pre Biometrics - 06/19/19 1455      Pre Biometrics   Height  4' 9.7" (1.466 m)    Weight  67.2 kg    Waist Circumference  44.5 inches    Hip Circumference  45.5 inches    Waist to Hip Ratio  0.98 %    BMI (Calculated)  31.27    Triceps Skinfold  22 mm    % Body Fat  44.9 %    Grip Strength  18 kg    Flexibility  0 in    Single Leg Stand  0 seconds        Nutrition Therapy Plan and Nutrition Goals:   Nutrition Assessments:   Nutrition Goals Re-Evaluation:   Nutrition Goals Discharge (Final Nutrition Goals Re-Evaluation):   Psychosocial: Target  Goals: Acknowledge presence or absence of significant depression and/or stress, maximize coping skills, provide positive support system. Participant is able to verbalize types and ability to use techniques and skills needed for reducing stress and depression.  Initial Review & Psychosocial Screening: Initial Psych Review & Screening - 06/20/19 0840      Initial Review   Current issues with  Current Anxiety/Panic;History of Depression;Current Psychotropic Meds      Family Dynamics   Good Support System?  Yes   Katherine [name] lives alone. Katherine [name] has assistance from her daily care by a home health aide that assists her 6 days a week. Katherine [name] has firends who help with filling her pill box and other needs. Katherine [name] also has a sister available if needed   Comments  Katherine [name]  has short term memory deficits. Will assist with patient as needed.      Barriers   Psychosocial barriers to participate in program  The patient should benefit from training in stress management and relaxation.      Screening Interventions   Interventions  Encouraged to exercise;Provide feedback about the scores to participant;To provide support and resources with identified psychosocial needs    Expected Outcomes  Short Term goal: Utilizing psychosocial counselor, staff and physician to assist with identification of specific Stressors or current issues interfering with healing process. Setting desired goal for each stressor or current issue identified.;Long Term Goal: Stressors or current issues are controlled or eliminated.;Short Term goal: Identification and review with participant of any Quality of Life or Depression concerns found by scoring the questionnaire.;Long Term goal: The participant improves quality of Life and PHQ9 Scores as seen by post scores and/or verbalization of changes       Quality of Life Scores: Quality of Life - 06/20/19 1017      Quality of Life   Select  --   Reviewed quality of life with the patient. Lives alone but has  support from friends, family and the community     Scores of 19 and below usually indicate a poorer quality of life in these areas.  A difference of  2-3 points is a clinically meaningful difference.  A difference of 2-3 points in the total score of the Quality of Life Index has been associated with significant improvement in overall quality of life, self-image, physical symptoms, and general health in studies assessing change in quality of life.  PHQ-9: Recent Review Flowsheet Data    Depression screen [facility] 2/9 06/20/2019 06/15/2015 03/30/2014 08/08/2013 11/07/2012   Decreased Interest 0 0 0 0 0   Down, Depressed, Hopeless 0 1 0 1  0   PHQ - 2 Score 0 1 0 1 0     Interpretation of Total Score  Total Score Depression Severity:  1-4 = Minimal depression, 5-9 = Mild depression, 10-14 = Moderate depression, 15-19 = Moderately severe depression, 20-27 = Severe depression   Psychosocial Evaluation and Intervention:   Psychosocial Re-Evaluation: Psychosocial Re-Evaluation    Row Name 07/03/19 1637             Psychosocial Re-Evaluation   Current issues with  History of Depression;Current Psychotropic Meds       Comments  Katherine [name] has not identified any increased stressors since being involved in phase 2 cardiac rehab       Interventions  Stress management education;Encouraged to attend Cardiac Rehabilitation for the exercise       Continue Psychosocial Services   Follow up required by staff          Psychosocial Discharge (Final Psychosocial Re-Evaluation): Psychosocial Re-Evaluation - 07/03/19 1637      Psychosocial Re-Evaluation   Current issues with  History of Depression;Current Psychotropic Meds    Comments  Katherine [name] has not identified any increased stressors since being involved in phase 2 cardiac rehab    Interventions  Stress management education;Encouraged to attend Cardiac Rehabilitation for the exercise    Continue Psychosocial Services   Follow up required by staff        Vocational Rehabilitation: Provide vocational rehab assistance to qualifying candidates.   Vocational Rehab Evaluation & Intervention: Vocational Rehab - 06/20/19 1019      Initial Vocational Rehab Evaluation & Intervention   Assessment shows need for Vocational Rehabilitation  No   Elita Quick is disabled  and does not need vocational rehab at this time.      Education: Education Goals: Education classes will be provided on a weekly basis, covering required topics. Participant will state understanding/return demonstration of topics presented.  Learning Barriers/Preferences: Learning Barriers/Preferences - 06/19/19 1520      Learning Barriers/Preferences   Learning Barriers  --   Mild Dementia   Learning Preferences  Skilled Demonstration;Individual Instruction       Education Topics: Hypertension, Hypertension Reduction -Define heart disease and high blood pressure. Discus how high blood pressure affects the body and ways to reduce high blood pressure.   Exercise and Your Heart -Discuss why it is important to exercise, the FITT principles of exercise, normal and abnormal responses to exercise, and how to exercise safely.   Angina -Discuss definition of angina, causes of angina, treatment of angina, and how to decrease risk of having angina.   Cardiac Medications -Review what the following cardiac medications are used for, how they affect the body, and side effects that may occur when taking the medications.  Medications include Aspirin, Beta blockers, calcium channel blockers, ACE Inhibitors, angiotensin receptor blockers, diuretics, digoxin, and antihyperlipidemics.   Congestive Heart Failure -Discuss the definition of CHF, how to live with CHF, the signs and symptoms of CHF, and how keep track of weight and sodium intake.   Heart Disease and Intimacy -Discus the effect sexual activity has on the heart, how changes occur during intimacy as we age, and safety during  sexual activity.   Smoking Cessation / COPD -Discuss different methods to quit smoking, the health benefits of quitting smoking, and the definition of COPD.   Nutrition I: Fats -Discuss the types of cholesterol, what cholesterol does to the heart, and how cholesterol levels can be controlled.   Nutrition II: Labels -Discuss the different components of food labels and how to read food label   Heart Parts/Heart Disease and PAD -Discuss the anatomy of the heart, the pathway of blood circulation through the heart, and these are affected by heart disease.   Stress I: Signs and Symptoms -Discuss the causes of stress, how stress may lead to anxiety and depression, and ways to limit stress.   Stress II: Relaxation -Discuss different types of relaxation techniques to limit stress.   Warning Signs of Stroke / TIA -Discuss definition of a stroke, what the signs and symptoms are of a stroke, and how to identify when someone is having stroke.   Knowledge Questionnaire Score: Knowledge Questionnaire Score - 06/19/19 1509      Knowledge Questionnaire Score   Pre Score  19/24       Core Components/Risk Factors/Patient Goals at Admission: Personal Goals and Risk Factors at Admission - 06/20/19 1020      Core Components/Risk Factors/Patient Goals on Admission    Weight Management  Yes;Obesity    Intervention  Weight Management: Develop a combined nutrition and exercise program designed to reach desired caloric intake, while maintaining appropriate intake of nutrient and fiber, sodium and fats, and appropriate energy expenditure required for the weight goal.;Weight Management: Provide education and appropriate resources to help participant work on and attain dietary goals.;Weight Management/Obesity: Establish reasonable short term and long term weight goals.;Obesity: Provide education and appropriate resources to help participant work on and attain dietary goals.    Expected Outcomes  Short  Term: Continue to assess and modify interventions until short term weight is achieved;Long Term: Adherence to nutrition and physical activity/exercise program aimed toward attainment of established weight goal;Weight  Maintenance: Understanding of the daily nutrition guidelines, which includes 25-35% calories from fat, 7% or less cal from saturated fats, less than 200mg  cholesterol, less than 1.5gm of sodium, & 5 or more servings of fruits and vegetables daily;Weight Loss: Understanding of general recommendations for a balanced deficit meal plan, which promotes 1-2 lb weight loss per week and includes a negative energy balance of [phone] kcal/d;Understanding recommendations for meals to include 15-35% energy as protein, 25-35% energy from fat, 35-60% energy from carbohydrates, less than 200mg  of dietary cholesterol, 20-35 gm of total fiber daily;Understanding of distribution of calorie intake throughout the day with the consumption of 4-5 meals/snacks    Diabetes  Yes    Intervention  Provide education about signs/symptoms and action to take for hypo/hyperglycemia.;Provide education about proper nutrition, including hydration, and aerobic/resistive exercise prescription along with prescribed medications to achieve blood glucose in normal ranges: Fasting glucose 65-99 mg/dL    Expected Outcomes  Long Term: Attainment of HbA1C < 7%.;Short Term: Participant verbalizes understanding of the signs/symptoms and immediate care of hyper/hypoglycemia, proper foot care and importance of medication, aerobic/resistive exercise and nutrition plan for blood glucose control.    Hypertension  Yes    Intervention  Provide education on lifestyle modifcations including regular physical activity/exercise, weight management, moderate sodium restriction and increased consumption of fresh fruit, vegetables, and low fat dairy, alcohol moderation, and smoking cessation.;Monitor prescription use compliance.    Expected Outcomes  Short  Term: Continued assessment and intervention until BP is < 140/15mm HG in hypertensive participants. < 130/75mm HG in hypertensive participants with diabetes, heart failure or chronic kidney disease.;Long Term: Maintenance of blood pressure at goal levels.    Lipids  Yes    Intervention  Provide education and support for participant on nutrition & aerobic/resistive exercise along with prescribed medications to achieve LDL 70mg , HDL >40mg .    Expected Outcomes  Short Term: Participant states understanding of desired cholesterol values and is compliant with medications prescribed. Participant is following exercise prescription and nutrition guidelines.;Long Term: Cholesterol controlled with medications as prescribed, with individualized exercise RX and with personalized nutrition plan. Value goals: LDL < 70mg , HDL > 40 mg.    Stress  Yes    Intervention  Offer individual and/or small group education and counseling on adjustment to heart disease, stress management and health-related lifestyle change. Teach and support self-help strategies.    Expected Outcomes  Short Term: Participant demonstrates changes in health-related behavior, relaxation and other stress management skills, ability to obtain effective social support, and compliance with psychotropic medications if prescribed.;Long Term: Emotional wellbeing is indicated by absence of clinically significant psychosocial distress or social isolation.       Core Components/Risk Factors/Patient Goals Review:  Goals and Risk Factor Review    Row Name 06/23/19 1639 06/24/19 1114 07/03/19 1638         Core Components/Risk Factors/Patient Goals Review   Personal Goals Review  Weight Management/Obesity;Lipids;Stress;Hypertension  --  Weight Management/Obesity;Lipids;Stress;Hypertension     Review  Katherine [name] did fair with her first day of exercise on 06/23/19. Vital signs were stable.  Katherine [name] did fair with her first day of exercise on 06/23/19. Vital signs were  stable. Katherine [name] is deconditioned.  Katherine [name] has done as well as can be expected due to her deconditioned state. Katherine [name] is pleasant and enjoys participating in the program     Expected Outcomes  Katherine [name] will continue to participate in phase 2 cardiac rehab for exercise nutrtion and lifestyle modifications  --  Katherine [name]  will continue to participate in phase 2 cardiac rehab for exercise nutrtion and lifestyle modifications        Core Components/Risk Factors/Patient Goals at Discharge (Final Review):  Goals and Risk Factor Review - 07/03/19 1638      Core Components/Risk Factors/Patient Goals Review   Personal Goals Review  Weight Management/Obesity;Lipids;Stress;Hypertension    Review  Katherine [name] has done as well as can be expected due to her deconditioned state. Katherine [name] is pleasant and enjoys participating in the program    Expected Outcomes  Katherine [name] will continue to participate in phase 2 cardiac rehab for exercise nutrtion and lifestyle modifications       ITP Comments: ITP Comments    Row Name 06/19/19 1453 07/03/19 1636         ITP Comments  Dr. Armanda Magicraci Turner, Medical Director  30 Day ITP Review. Katherine [name] is doing fair with exercise. Katherine [name] has good attendance.         Comments: See ITP comments.[place] LighterMaria Alyla Pietila, RN,BSN 07/03/2019 4:43 PM

## 2019-07-04 ENCOUNTER — Encounter (HOSPITAL_COMMUNITY): Payer: Medicare Other

## 2019-07-07 ENCOUNTER — Other Ambulatory Visit: Payer: Self-pay

## 2019-07-07 ENCOUNTER — Encounter (HOSPITAL_COMMUNITY)
Admission: RE | Admit: 2019-07-07 | Discharge: 2019-07-07 | Disposition: A | Discharge status: Home / Self Care | Payer: Medicare Other | Source: Ambulatory Visit | Attending: Cardiovascular Disease | Admitting: Cardiovascular Disease

## 2019-07-07 DIAGNOSIS — Z952 Presence of prosthetic heart valve: Secondary | ICD-10-CM | POA: Diagnosis not present

## 2019-07-07 DIAGNOSIS — Z955 Presence of coronary angioplasty implant and graft: Secondary | ICD-10-CM

## 2019-07-09 ENCOUNTER — Encounter (HOSPITAL_COMMUNITY)
Admission: RE | Admit: 2019-07-09 | Discharge: 2019-07-09 | Disposition: A | Discharge status: Home / Self Care | Payer: Medicare Other | Source: Ambulatory Visit | Attending: Cardiovascular Disease | Admitting: Cardiovascular Disease

## 2019-07-09 ENCOUNTER — Other Ambulatory Visit: Payer: Self-pay

## 2019-07-09 DIAGNOSIS — Z952 Presence of prosthetic heart valve: Secondary | ICD-10-CM

## 2019-07-09 DIAGNOSIS — Z955 Presence of coronary angioplasty implant and graft: Secondary | ICD-10-CM

## 2019-07-11 ENCOUNTER — Other Ambulatory Visit: Payer: Self-pay

## 2019-07-11 ENCOUNTER — Encounter (HOSPITAL_COMMUNITY)
Admission: RE | Admit: 2019-07-11 | Discharge: 2019-07-11 | Disposition: A | Discharge status: Home / Self Care | Payer: Medicare Other | Source: Ambulatory Visit | Attending: Cardiovascular Disease | Admitting: Cardiovascular Disease

## 2019-07-11 DIAGNOSIS — Z952 Presence of prosthetic heart valve: Secondary | ICD-10-CM

## 2019-07-11 DIAGNOSIS — Z955 Presence of coronary angioplasty implant and graft: Secondary | ICD-10-CM

## 2019-07-14 ENCOUNTER — Encounter (HOSPITAL_COMMUNITY)
Admission: RE | Admit: 2019-07-14 | Discharge: 2019-07-14 | Disposition: A | Discharge status: Home / Self Care | Payer: Medicare Other | Source: Ambulatory Visit | Attending: Cardiovascular Disease | Admitting: Cardiovascular Disease

## 2019-07-14 ENCOUNTER — Other Ambulatory Visit: Payer: Self-pay

## 2019-07-14 DIAGNOSIS — Z952 Presence of prosthetic heart valve: Secondary | ICD-10-CM | POA: Diagnosis not present

## 2019-07-14 DIAGNOSIS — Z955 Presence of coronary angioplasty implant and graft: Secondary | ICD-10-CM

## 2019-07-16 ENCOUNTER — Encounter (HOSPITAL_COMMUNITY)
Admission: RE | Admit: 2019-07-16 | Discharge: 2019-07-16 | Disposition: A | Discharge status: Home / Self Care | Payer: Medicare Other | Source: Ambulatory Visit | Attending: Cardiovascular Disease | Admitting: Cardiovascular Disease

## 2019-07-16 ENCOUNTER — Other Ambulatory Visit: Payer: Self-pay

## 2019-07-16 DIAGNOSIS — Z952 Presence of prosthetic heart valve: Secondary | ICD-10-CM | POA: Diagnosis not present

## 2019-07-16 DIAGNOSIS — Z955 Presence of coronary angioplasty implant and graft: Secondary | ICD-10-CM

## 2019-07-18 ENCOUNTER — Other Ambulatory Visit: Payer: Self-pay

## 2019-07-18 ENCOUNTER — Encounter (HOSPITAL_COMMUNITY)
Admission: RE | Admit: 2019-07-18 | Discharge: 2019-07-18 | Disposition: A | Discharge status: Home / Self Care | Payer: Medicare Other | Source: Ambulatory Visit | Attending: Cardiovascular Disease | Admitting: Cardiovascular Disease

## 2019-07-18 DIAGNOSIS — Z952 Presence of prosthetic heart valve: Secondary | ICD-10-CM | POA: Diagnosis not present

## 2019-07-18 DIAGNOSIS — Z955 Presence of coronary angioplasty implant and graft: Secondary | ICD-10-CM

## 2019-07-21 ENCOUNTER — Encounter (HOSPITAL_COMMUNITY)
Admission: RE | Admit: 2019-07-21 | Discharge: 2019-07-21 | Disposition: A | Discharge status: Home / Self Care | Payer: Medicare Other | Source: Ambulatory Visit | Attending: Cardiovascular Disease | Admitting: Cardiovascular Disease

## 2019-07-21 ENCOUNTER — Other Ambulatory Visit: Payer: Self-pay

## 2019-07-21 DIAGNOSIS — Z952 Presence of prosthetic heart valve: Secondary | ICD-10-CM

## 2019-07-21 DIAGNOSIS — Z955 Presence of coronary angioplasty implant and graft: Secondary | ICD-10-CM

## 2019-07-23 ENCOUNTER — Other Ambulatory Visit: Payer: Self-pay

## 2019-07-23 ENCOUNTER — Encounter (HOSPITAL_COMMUNITY)
Admission: RE | Admit: 2019-07-23 | Discharge: 2019-07-23 | Disposition: A | Discharge status: Home / Self Care | Payer: Medicare Other | Source: Ambulatory Visit | Attending: Cardiovascular Disease | Admitting: Cardiovascular Disease

## 2019-07-23 DIAGNOSIS — Z952 Presence of prosthetic heart valve: Secondary | ICD-10-CM

## 2019-07-23 DIAGNOSIS — Z955 Presence of coronary angioplasty implant and graft: Secondary | ICD-10-CM

## 2019-07-24 NOTE — Progress Notes (Signed)
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  albuterol (VENTOLIN HFA) 108 (90 Base) MCG/ACT inhaler, Inhale 1 puff into the lungs 2 (two) times daily as needed for wheezing or shortness of breath. , Disp: , Rfl:  .  ascorbic acid (VITAMIN C) 500 MG tablet, Take 500 mg by mouth daily., Disp: , Rfl:  .  aspirin EC 81 MG tablet, Take 81 mg by mouth daily. , Disp: , Rfl:  .  benztropine (COGENTIN) 0.5 MG tablet, Take 0.5 mg by mouth 2 (two) times daily.  , Disp: , Rfl:  .  calcium-vitamin D (OSCAL WITH D) 500-200 MG-UNIT tablet, Take 1 tablet by mouth 3 (three) times daily., Disp: 270 tablet, Rfl: 3 .  cetirizine (ZYRTEC) 10 MG tablet, TAKE 1 TABLET BY MOUTH DAILY, Disp: 30 tablet, Rfl: 0 .  clindamycin (CLEOCIN) 300 MG capsule, Take 600 mg (2 capsules) by mouth 60 minutes prior to all dental procedures., Disp: 4 capsule, Rfl: 3 .  clopidogrel (PLAVIX) 75 MG tablet, Take 1 tablet (75 mg total) by mouth daily., Disp: 90 tablet, Rfl: 3 .  diclofenac sodium (VOLTAREN) 1 % GEL, Apply 2-4 g topically 4 (four) times daily as needed (Pain). , Disp: , Rfl:  .  fluticasone (FLONASE) 50 MCG/ACT nasal spray, Place 1 spray into both nostrils daily as needed for allergies. , Disp: , Rfl:  .  gabapentin (NEURONTIN) 100 MG capsule, Take 100 mg by mouth 3 (three) times daily. , Disp: , Rfl:  .  hydrOXYzine (ATARAX/VISTARIL) 10 MG tablet, Take 1 tablet (10 mg  total) by mouth 2 (two) times daily as needed., Disp: 30 tablet, Rfl: 11 .  ipratropium (ATROVENT) 0.06 % nasal spray, USE 1 SPRAY IN EACH NOSTRIL THREE TIMES DAILY, Disp: 30 mL, Rfl: 11 .  metFORMIN (GLUCOPHAGE) 500 MG tablet, Take 500 mg by mouth 2 (two) times daily. , Disp: , Rfl:  .  Multiple Vitamin (MULTIVITAMIN WITH MINERALS) TABS tablet, Take 1 tablet by mouth daily., Disp: , Rfl:  .  ONETOUCH DELICA LANCETS 20U MISC, CHECK BLOOD SUGAR ONCE DAILY AS DIRECTED, Disp: 100 each, Rfl: 0 .  pantoprazole (PROTONIX) 40 MG tablet, TAKE 1 TABLET BY MOUTH DAILY, Disp: 90 tablet, Rfl: 0 .  Potassium Chloride ER 20 MEQ TBCR, Take 20 mEq by mouth 2 (two) times daily. , Disp: , Rfl: 1 .  risperiDONE (RISPERDAL) 3 MG tablet, Take 3 mg by mouth at bedtime. , Disp: , Rfl:  .  rosuvastatin (CRESTOR) 40 MG tablet, TAKE 1 TABLET(40 MG) BY MOUTH DAILY AT 6 PM, Disp: 90 tablet, Rfl: 3 .  UREA 20 INTENSIVE HYDRATING 20 % cream, Apply 1 application topically daily as needed (dry skin). For dry skin, Disp: , Rfl: 6  Past Medical History: Past Medical History:  Diagnosis Date  . ANXIETY 07/31/2007  . CAD (coronary artery disease)   . Coronary artery disease involving native coronary artery of native heart with  angina pectoris ([place])   . DEPRESSION 09/05/2006  . DIABETES MELLITUS, TYPE II   . Diabetic foot ulcer ([place]) 10/26/2014  . DIVERTICULOSIS, COLON 09/05/2006  . FIBROMYALGIA 05/10/2009  . GERD 09/05/2006  . HYPERLIPIDEMIA 09/05/2006  . HYPERTENSION 09/05/2006  . MRSA infection 10/26/2014  . OSTEOARTHRITIS 09/05/2006  . OSTEOPOROSIS 07/31/2007  . 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 Severe aortic stenosis    s/p TAVR  . Unspecified hearing loss 10/09/2007    Tobacco Use: Social History   Tobacco Use  Smoking Status Never Smoker  Smokeless Tobacco Never Used    Labs: Recent Review Flowsheet Data    Labs  for ITP Cardiac and Pulmonary Rehab Latest Ref Rng & Units 04/18/2019 04/22/2019 04/22/2019 04/22/2019 04/22/2019   Cholestrol 0 - 200 mg/dL - - - - -   LDLCALC 0 - 99 mg/dL - - - - -   LDLDIRECT mg/dL - - - - -   HDL >39.00 mg/dL - - - - -   Trlycerides 0 - 149 mg/dL - - - - -   Hemoglobin A1c 4.8 - 5.6 % 5.7(H) - - - -   PHART 7.35 - 7.45 7.443 - 7.326(L) - -   PCO2ART 32 - 48 mmHg 35.8 - 51.7(H) - -   HCO3 20.0 - 28.0 mmol/L 24.1 - 27.0 - -   TCO2 22 - 32 mmol/L - _0 ACIDBASEDEF 0.0 - 2.0 mmol/L - - - - -   O2SAT % 98.1 - 100.0 - -      Capillary Blood Glucose: Lab Results  Component Value Date   GLUCAP 105 (H) 07/02/2019   GLUCAP 90 06/25/2019   GLUCAP 112 (H) 06/23/2019   GLUCAP 103 (H) 06/23/2019   GLUCAP 108 (H) 04/24/2019     Exercise Target Goals: Exercise Program Goal: Individual exercise prescription set using results from initial 6 min walk test and THRR while considering  patient's activity barriers and safety.   Exercise Prescription Goal: Starting with aerobic activity 30 plus minutes a day, 3 days per week for initial exercise prescription. Provide home exercise prescription and guidelines that participant acknowledges understanding prior to discharge.  Activity Barriers & Risk Stratification:  Activity Barriers & Cardiac Risk Stratification - 06/19/19 1455      Activity Barriers & Cardiac Risk Stratification   Activity Barriers Fibromyalgia;Deconditioning    Cardiac Risk Stratification High           6 Minute Walk:  6 Minute Walk    Row Name 06/19/19 1453         6 Minute Walk   Phase Initial     Distance 685 feet     Walk Time 6 minutes     # of Rest Breaks 1     MPH 1.3     METS 1.3     RPE 12     Perceived Dyspnea  1     VO2 Peak 4.7     Symptoms Yes (comment)     Comments Mild SOB +1, Pt took one 27 second rest break     Resting HR 70 bpm     Resting BP 118/70     Resting Oxygen Saturation  97 %     Exercise Oxygen  Saturation  during 6 min walk 97 %     Max Ex. HR  96 bpm     Max Ex. BP 144/82     2 Minute Post BP 118/68            Oxygen Initial Assessment:   Oxygen Re-Evaluation:   Oxygen Discharge (Final Oxygen Re-Evaluation):   Initial Exercise Prescription:  Initial Exercise Prescription - 06/19/19 1400      Date of Initial Exercise RX and Referring Provider   Date 06/19/19    Referring Provider Sherren Mocha MD    Expected Discharge Date 08/15/19      NuStep   Level 1    SPM 75    Minutes 25    METs 1.8      Prescription Details   Frequency (times per week) 3x    Duration Progress to 30 minutes of continuous aerobic without signs/symptoms of physical distress      Intensity   THRR 40-80% of Max Heartrate 59-117    Ratings of Perceived Exertion 11-13    Perceived Dyspnea 0-4      Progression   Progression Continue progressive overload as per policy without signs/symptoms or physical distress.      Resistance Training   Training Prescription Yes    Weight 2lbs    Reps 10-15           Perform Capillary Blood Glucose checks as needed.  Exercise Prescription Changes:  Exercise Prescription Changes    Row Name 06/23/19 1518 06/27/19 1352 07/11/19 1600         Response to Exercise   Blood Pressure (Admit) 130/72 124/60 102/64     Blood Pressure (Exercise) 138/82 136/64 162/84     Blood Pressure (Exit) 128/72 124/80 134/80     Heart Rate (Admit) 85 bpm 85 bpm 83 bpm     Heart Rate (Exercise) 87 bpm 99 bpm 82 bpm     Heart Rate (Exit) 76 bpm 78 bpm 80 bpm     Rating of Perceived Exertion (Exercise) _0 Perceived Dyspnea (Exercise) -- 0 --     Symptoms none None None     Comments Off to a good start with exercise.  None Pt progressing slowly     Duration Progress to 30 minutes of  aerobic without signs/symptoms of physical distress Progress to 30 minutes of  aerobic without signs/symptoms of physical distress Progress to 30 minutes of  aerobic  without signs/symptoms of physical distress     Intensity THRR unchanged THRR unchanged THRR unchanged       Progression   Progression Continue to progress workloads to maintain intensity without signs/symptoms of physical distress. Continue to progress workloads to maintain intensity without signs/symptoms of physical distress. Continue to progress workloads to maintain intensity without signs/symptoms of physical distress.     Average METs 1.4 1.5 1.4       Resistance Training   Training Prescription Yes No Yes     Weight 1lb -- 2     Reps 10-15 -- 10-15     Time 10 Minutes -- 10 Minutes  Needs one-to-one to complete       Interval Training   Interval Training No No No       NuStep   Level _1 SPM 75 75 75     Minutes _2 METs 1.4 1.5 1.4            Exercise Comments:  Exercise Comments    Row  Name 06/23/19 1609 07/11/19 1642 07/17/19 1154       Exercise Comments Patient tolerated 1st session of exercise well without symptoms. Reviwed nustep workload. Pt still at level 2, progressing slowing. Increased to 2 lb wts. Reviwed nustep workload. Pt still at level 2, progressing slowing. Increased to 2 lb wts. Pt is not appropriate to recieve home exercise prescription due to her Dementia.            Exercise Goals and Review:  Exercise Goals    Row Name 06/19/19 1457             Exercise Goals   Increase Physical Activity Yes       Intervention Provide advice, education, support and counseling about physical activity/exercise needs.;Develop an individualized exercise prescription for aerobic and resistive training based on initial evaluation findings, risk stratification, comorbidities and participant's personal goals.       Expected Outcomes Short Term: Attend rehab on a regular basis to increase amount of physical activity.;Long Term: Add in home exercise to make exercise part of routine and to increase amount of physical activity.;Long Term: Exercising  regularly at least 3-5 days a week.       Increase Strength and Stamina Yes       Intervention Provide advice, education, support and counseling about physical activity/exercise needs.;Develop an individualized exercise prescription for aerobic and resistive training based on initial evaluation findings, risk stratification, comorbidities and participant's personal goals.       Expected Outcomes Short Term: Increase workloads from initial exercise prescription for resistance, speed, and METs.;Short Term: Perform resistance training exercises routinely during rehab and add in resistance training at home;Long Term: Improve cardiorespiratory fitness, muscular endurance and strength as measured by increased METs and functional capacity (6MWT)       Able to understand and use rate of perceived exertion (RPE) scale Yes       Intervention Provide education and explanation on how to use RPE scale       Expected Outcomes Short Term: Able to use RPE daily in rehab to express subjective intensity level;Long Term:  Able to use RPE to guide intensity level when exercising independently       Knowledge and understanding of Target Heart Rate Range (THRR) Yes       Intervention Provide education and explanation of THRR including how the numbers were predicted and where they are located for reference       Expected Outcomes Short Term: Able to state/look up THRR;Long Term: Able to use THRR to govern intensity when exercising independently;Short Term: Able to use daily as guideline for intensity in rehab       Able to check pulse independently Yes       Intervention Provide education and demonstration on how to check pulse in carotid and radial arteries.;Review the importance of being able to check your own pulse for safety during independent exercise       Expected Outcomes Short Term: Able to explain why pulse checking is important during independent exercise;Long Term: Able to check pulse independently and accurately        Understanding of Exercise Prescription Yes       Intervention Provide education, explanation, and written materials on patient's individual exercise prescription       Expected Outcomes Short Term: Able to explain program exercise prescription;Long Term: Able to explain home exercise prescription to exercise independently              Exercise Goals Re-Evaluation :  Exercise Goals Re-Evaluation    [place] Name 06/23/19 1609 07/21/19 1610           Exercise Goal Re-Evaluation   Exercise Goals Review Increase Physical Activity Increase Physical Activity;Increase Strength and Stamina      Comments Patient tolerated low intensity exercise without symptoms. Pt continues to exercise at low level without symptoms. Pt requires frequent direction, due to memory issues, to keep SPM on nustep at desired level.      Expected Outcomes Increase workloads as tolerated to help improve cardiorespiratory fitness. Will continue to increase workloads as tolerated.              Discharge Exercise Prescription (Final Exercise Prescription Changes):  Exercise Prescription Changes - 07/11/19 1600      Response to Exercise   Blood Pressure (Admit) 102/64    Blood Pressure (Exercise) 162/84    Blood Pressure (Exit) 134/80    Heart Rate (Admit) 83 bpm    Heart Rate (Exercise) 82 bpm    Heart Rate (Exit) 80 bpm    Rating of Perceived Exertion (Exercise) 11    Symptoms None    Comments Pt progressing slowly    Duration Progress to 30 minutes of  aerobic without signs/symptoms of physical distress    Intensity THRR unchanged      Progression   Progression Continue to progress workloads to maintain intensity without signs/symptoms of physical distress.    Average METs 1.4      Resistance Training   Training Prescription Yes    Weight 2    Reps 10-15    Time 10 Minutes   Needs one-to-one to complete     Interval Training   Interval Training No      NuStep   Level 2    SPM 75    Minutes 30     METs 1.4           Nutrition:  Target Goals: Understanding of nutrition guidelines, daily intake of sodium <1527m, cholesterol <2057m calories 30% from fat and 7% or less from saturated fats, daily to have 5 or more servings of fruits and vegetables.  Biometrics:  Pre Biometrics - 06/19/19 1455      Pre Biometrics   Height 4' 9.7" (1.466 m)    Weight 67.2 kg    Waist Circumference 44.5 inches    Hip Circumference 45.5 inches    Waist to Hip Ratio 0.98 %    BMI (Calculated) 31.27    Triceps Skinfold 22 mm    % Body Fat 44.9 %    Grip Strength 18 kg    Flexibility 0 in    Single Leg Stand 0 seconds            Nutrition Therapy Plan and Nutrition Goals:   Nutrition Assessments:   Nutrition Goals Re-Evaluation:   Nutrition Goals Discharge (Final Nutrition Goals Re-Evaluation):   Psychosocial: Target Goals: Acknowledge presence or absence of significant depression and/or stress, maximize coping skills, provide positive support system. Participant is able to verbalize types and ability to use techniques and skills needed for reducing stress and depression.  Initial Review & Psychosocial Screening:  Initial Psych Review & Screening - 06/20/19 0840      Initial Review   Current issues with Current Anxiety/Panic;History of Depression;Current Psychotropic Meds      Family Dynamics   Good Support System? Yes   [name] lives alone. [name] has assistance from her daily care by a home health aide that assists  her 6 days a week. [name] has firends who help with filling her pill box and other needs. [name] also has a sister available if needed   Comments [name] has short term memory deficits. Will assist with patient as needed.      Barriers   Psychosocial barriers to participate in program The patient should benefit from training in stress management and relaxation.      Screening Interventions   Interventions Encouraged to exercise;Provide feedback about the scores to participant;To  provide support and resources with identified psychosocial needs    Expected Outcomes Short Term goal: Utilizing psychosocial counselor, staff and physician to assist with identification of specific Stressors or current issues interfering with healing process. Setting desired goal for each stressor or current issue identified.;Long Term Goal: Stressors or current issues are controlled or eliminated.;Short Term goal: Identification and review with participant of any Quality of Life or Depression concerns found by scoring the questionnaire.;Long Term goal: The participant improves quality of Life and PHQ9 Scores as seen by post scores and/or verbalization of changes           Quality of Life Scores:  Quality of Life - 06/20/19 1017      Quality of Life   Select --   Reviewed quality of life with the patient. Lives alone but has support from friends, family and the community         Scores of 19 and below usually indicate a poorer quality of life in these areas.  A difference of  2-3 points is a clinically meaningful difference.  A difference of 2-3 points in the total score of the Quality of Life Index has been associated with significant improvement in overall quality of life, self-image, physical symptoms, and general health in studies assessing change in quality of life.  PHQ-9: Recent Review Flowsheet Data    Depression screen [facility] 2/9 06/20/2019 06/15/2015 03/30/2014 08/08/2013 11/07/2012   Decreased Interest 0 0 0 0 0   Down, Depressed, Hopeless 0 1 0 1  0   PHQ - 2 Score 0 1 0 1 0     Interpretation of Total Score  Total Score Depression Severity:  1-4 = Minimal depression, 5-9 = Mild depression, 10-14 = Moderate depression, 15-19 = Moderately severe depression, 20-27 = Severe depression   Psychosocial Evaluation and Intervention:   Psychosocial Re-Evaluation:  Psychosocial Re-Evaluation    [place] Name 07/03/19 1637 07/24/19 1600           Psychosocial Re-Evaluation   Current  issues with History of Depression;Current Psychotropic Meds History of Depression;Current Psychotropic Meds      Comments [name] has not identified any increased stressors since being involved in phase 2 cardiac rehab [name] has not identified any increased stressors since being involved in phase 2 cardiac rehab      Interventions Stress management education;Encouraged to attend Cardiac Rehabilitation for the exercise Stress management education;Encouraged to attend Cardiac Rehabilitation for the exercise      Continue Psychosocial Services  Follow up required by staff Follow up required by staff             Psychosocial Discharge (Final Psychosocial Re-Evaluation):  Psychosocial Re-Evaluation - 07/24/19 1600      Psychosocial Re-Evaluation   Current issues with History of Depression;Current Psychotropic Meds    Comments [name] has not identified any increased stressors since being involved in phase 2 cardiac rehab    Interventions Stress management education;Encouraged to attend Cardiac Rehabilitation for the exercise  Continue Psychosocial Services  Follow up required by staff           Vocational Rehabilitation: Provide vocational rehab assistance to qualifying candidates.   Vocational Rehab Evaluation & Intervention:  Vocational Rehab - 06/20/19 1019      Initial Vocational Rehab Evaluation & Intervention   Assessment shows need for Vocational Rehabilitation No   Jeannene Patella is disabled and does not need vocational rehab at this time.          Education: Education Goals: Education classes will be provided on a weekly basis, covering required topics. Participant will state understanding/return demonstration of topics presented.  Learning Barriers/Preferences:  Learning Barriers/Preferences - 06/19/19 1520      Learning Barriers/Preferences   Learning Barriers --   Mild Dementia   Learning Preferences Skilled Demonstration;Individual Instruction           Education  Topics: Hypertension, Hypertension Reduction -Define heart disease and high blood pressure. Discus how high blood pressure affects the body and ways to reduce high blood pressure.   Exercise and Your Heart -Discuss why it is important to exercise, the FITT principles of exercise, normal and abnormal responses to exercise, and how to exercise safely.   Angina -Discuss definition of angina, causes of angina, treatment of angina, and how to decrease risk of having angina.   Cardiac Medications -Review what the following cardiac medications are used for, how they affect the body, and side effects that may occur when taking the medications.  Medications include Aspirin, Beta blockers, calcium channel blockers, ACE Inhibitors, angiotensin receptor blockers, diuretics, digoxin, and antihyperlipidemics.   Congestive Heart Failure -Discuss the definition of CHF, how to live with CHF, the signs and symptoms of CHF, and how keep track of weight and sodium intake.   Heart Disease and Intimacy -Discus the effect sexual activity has on the heart, how changes occur during intimacy as we age, and safety during sexual activity.   Smoking Cessation / COPD -Discuss different methods to quit smoking, the health benefits of quitting smoking, and the definition of COPD.   Nutrition I: Fats -Discuss the types of cholesterol, what cholesterol does to the heart, and how cholesterol levels can be controlled.   Nutrition II: Labels -Discuss the different components of food labels and how to read food label   Heart Parts/Heart Disease and PAD -Discuss the anatomy of the heart, the pathway of blood circulation through the heart, and these are affected by heart disease.   Stress I: Signs and Symptoms -Discuss the causes of stress, how stress may lead to anxiety and depression, and ways to limit stress.   Stress II: Relaxation -Discuss different types of relaxation techniques to limit  stress.   Warning Signs of Stroke / TIA -Discuss definition of a stroke, what the signs and symptoms are of a stroke, and how to identify when someone is having stroke.   Knowledge Questionnaire Score:  Knowledge Questionnaire Score - 06/19/19 1509      Knowledge Questionnaire Score   Pre Score 19/24           Core Components/Risk Factors/Patient Goals at Admission:  Personal Goals and Risk Factors at Admission - 06/20/19 1020      Core Components/Risk Factors/Patient Goals on Admission    Weight Management Yes;Obesity    Intervention Weight Management: Develop a combined nutrition and exercise program designed to reach desired caloric intake, while maintaining appropriate intake of nutrient and fiber, sodium and fats, and appropriate energy expenditure required for the  weight goal.;Weight Management: Provide education and appropriate resources to help participant work on and attain dietary goals.;Weight Management/Obesity: Establish reasonable short term and long term weight goals.;Obesity: Provide education and appropriate resources to help participant work on and attain dietary goals.    Expected Outcomes Short Term: Continue to assess and modify interventions until short term weight is achieved;Long Term: Adherence to nutrition and physical activity/exercise program aimed toward attainment of established weight goal;Weight Maintenance: Understanding of the daily nutrition guidelines, which includes 25-35% calories from fat, 7% or less cal from saturated fats, less than 253m cholesterol, less than 1.5gm of sodium, & 5 or more servings of fruits and vegetables daily;Weight Loss: Understanding of general recommendations for a balanced deficit meal plan, which promotes 1-2 lb weight loss per week and includes a negative energy balance of [phone] kcal/d;Understanding recommendations for meals to include 15-35% energy as protein, 25-35% energy from fat, 35-60% energy from carbohydrates, less  than 2076mof dietary cholesterol, 20-35 gm of total fiber daily;Understanding of distribution of calorie intake throughout the day with the consumption of 4-5 meals/snacks    Diabetes Yes    Intervention Provide education about signs/symptoms and action to take for hypo/hyperglycemia.;Provide education about proper nutrition, including hydration, and aerobic/resistive exercise prescription along with prescribed medications to achieve blood glucose in normal ranges: Fasting glucose 65-99 mg/dL    Expected Outcomes Long Term: Attainment of HbA1C < 7%.;Short Term: Participant verbalizes understanding of the signs/symptoms and immediate care of hyper/hypoglycemia, proper foot care and importance of medication, aerobic/resistive exercise and nutrition plan for blood glucose control.    Hypertension Yes    Intervention Provide education on lifestyle modifcations including regular physical activity/exercise, weight management, moderate sodium restriction and increased consumption of fresh fruit, vegetables, and low fat dairy, alcohol moderation, and smoking cessation.;Monitor prescription use compliance.    Expected Outcomes Short Term: Continued assessment and intervention until BP is < 140/9066mG in hypertensive participants. < 130/72m8m in hypertensive participants with diabetes, heart failure or chronic kidney disease.;Long Term: Maintenance of blood pressure at goal levels.    Lipids Yes    Intervention Provide education and support for participant on nutrition & aerobic/resistive exercise along with prescribed medications to achieve LDL <70mg98mL >40mg.63mExpected Outcomes Short Term: Participant states understanding of desired cholesterol values and is compliant with medications prescribed. Participant is following exercise prescription and nutrition guidelines.;Long Term: Cholesterol controlled with medications as prescribed, with individualized exercise RX and with personalized nutrition plan. Value  goals: LDL < 70mg, 73m> 40 mg.    Stress Yes    Intervention Offer individual and/or small group education and counseling on adjustment to heart disease, stress management and health-related lifestyle change. Teach and support self-help strategies.    Expected Outcomes Short Term: Participant demonstrates changes in health-related behavior, relaxation and other stress management skills, ability to obtain effective social support, and compliance with psychotropic medications if prescribed.;Long Term: Emotional wellbeing is indicated by absence of clinically significant psychosocial distress or social isolation.           Core Components/Risk Factors/Patient Goals Review:   Goals and Risk Factor Review    Row Name 06/23/19 1639 06/24/19 1114 07/03/19 1638 07/24/19 1600       Core Components/Risk Factors/Patient Goals Review   Personal Goals Review Weight Management/Obesity;Lipids;Stress;Hypertension -- Weight Management/Obesity;Lipids;Stress;Hypertension Weight Management/Obesity;Lipids;Stress;Hypertension    Review [name] did fair with her first day of exercise on 06/23/19. Vital signs were stable. [name] did fair with her  first day of exercise on 06/23/19. Vital signs were stable. [name] is deconditioned. [name] has done as well as can be expected due to her deconditioned state. [name] is pleasant and enjoys participating in the program [name] continues to exercise at cardiac rehab mainly on the nustep with light stretching . [name] is pleasant and enjoys participating in the program. [name]'s met level remains low.    Expected Outcomes [name] will continue to participate in phase 2 cardiac rehab for exercise nutrtion and lifestyle modifications -- [name] will continue to participate in phase 2 cardiac rehab for exercise nutrtion and lifestyle modifications [name] will continue to participate in phase 2 cardiac rehab for exercise nutrtion and lifestyle modifications           Core Components/Risk Factors/Patient Goals at  Discharge (Final Review):   Goals and Risk Factor Review - 07/24/19 1600      Core Components/Risk Factors/Patient Goals Review   Personal Goals Review Weight Management/Obesity;Lipids;Stress;Hypertension    Review [name] continues to exercise at cardiac rehab mainly on the nustep with light stretching . [name] is pleasant and enjoys participating in the program. [name]'s met level remains low.    Expected Outcomes [name] will continue to participate in phase 2 cardiac rehab for exercise nutrtion and lifestyle modifications           ITP Comments:  ITP Comments    Row Name 06/19/19 1453 07/03/19 1636 07/24/19 1559       ITP Comments Dr. Fransico Him, Medical Director 30 Day ITP Review. [name] is doing fair with exercise. [name] has good attendance. 30 Day ITP Review. [name] continues to have good attendance at cardiac rehab and fair participation            Comments: See ITP comments.Barnet Pall, RN,BSN 07/24/2019 4:03 PM

## 2019-07-25 ENCOUNTER — Encounter (HOSPITAL_COMMUNITY): Payer: Medicare Other

## 2019-07-28 ENCOUNTER — Encounter (HOSPITAL_COMMUNITY)
Admission: RE | Admit: 2019-07-28 | Discharge: 2019-07-28 | Disposition: A | Discharge status: Home / Self Care | Payer: Medicare Other | Source: Ambulatory Visit | Attending: Cardiovascular Disease | Admitting: Cardiovascular Disease

## 2019-07-28 ENCOUNTER — Other Ambulatory Visit: Payer: Self-pay

## 2019-07-28 DIAGNOSIS — Z952 Presence of prosthetic heart valve: Secondary | ICD-10-CM | POA: Diagnosis not present

## 2019-07-28 DIAGNOSIS — Z955 Presence of coronary angioplasty implant and graft: Secondary | ICD-10-CM

## 2019-07-30 ENCOUNTER — Encounter (HOSPITAL_COMMUNITY)
Admission: RE | Admit: 2019-07-30 | Discharge: 2019-07-30 | Disposition: A | Discharge status: Home / Self Care | Payer: Medicare Other | Source: Ambulatory Visit | Attending: Cardiovascular Disease | Admitting: Cardiovascular Disease

## 2019-07-30 ENCOUNTER — Other Ambulatory Visit: Payer: Self-pay

## 2019-07-30 DIAGNOSIS — Z955 Presence of coronary angioplasty implant and graft: Secondary | ICD-10-CM

## 2019-07-30 DIAGNOSIS — Z952 Presence of prosthetic heart valve: Secondary | ICD-10-CM

## 2019-08-01 ENCOUNTER — Other Ambulatory Visit: Payer: Self-pay

## 2019-08-01 ENCOUNTER — Encounter (HOSPITAL_COMMUNITY)
Admission: RE | Admit: 2019-08-01 | Discharge: 2019-08-01 | Disposition: A | Discharge status: Home / Self Care | Payer: Medicare Other | Source: Ambulatory Visit | Attending: Cardiovascular Disease | Admitting: Cardiovascular Disease

## 2019-08-01 DIAGNOSIS — M797 Fibromyalgia: Secondary | ICD-10-CM | POA: Insufficient documentation

## 2019-08-01 DIAGNOSIS — Z8631 Personal history of diabetic foot ulcer: Secondary | ICD-10-CM | POA: Diagnosis not present

## 2019-08-01 DIAGNOSIS — E119 Type 2 diabetes mellitus without complications: Secondary | ICD-10-CM | POA: Insufficient documentation

## 2019-08-01 DIAGNOSIS — M1990 Unspecified osteoarthritis, unspecified site: Secondary | ICD-10-CM | POA: Insufficient documentation

## 2019-08-01 DIAGNOSIS — Z7902 Long term (current) use of antithrombotics/antiplatelets: Secondary | ICD-10-CM | POA: Insufficient documentation

## 2019-08-01 DIAGNOSIS — Z955 Presence of coronary angioplasty implant and graft: Secondary | ICD-10-CM

## 2019-08-01 DIAGNOSIS — E785 Hyperlipidemia, unspecified: Secondary | ICD-10-CM | POA: Diagnosis not present

## 2019-08-01 DIAGNOSIS — Z79899 Other long term (current) drug therapy: Secondary | ICD-10-CM | POA: Diagnosis not present

## 2019-08-01 DIAGNOSIS — I2510 Atherosclerotic heart disease of native coronary artery without angina pectoris: Secondary | ICD-10-CM | POA: Insufficient documentation

## 2019-08-01 DIAGNOSIS — Z7984 Long term (current) use of oral hypoglycemic drugs: Secondary | ICD-10-CM | POA: Insufficient documentation

## 2019-08-01 DIAGNOSIS — Z7982 Long term (current) use of aspirin: Secondary | ICD-10-CM | POA: Insufficient documentation

## 2019-08-01 DIAGNOSIS — K219 Gastro-esophageal reflux disease without esophagitis: Secondary | ICD-10-CM | POA: Insufficient documentation

## 2019-08-01 DIAGNOSIS — H9190 Unspecified hearing loss, unspecified ear: Secondary | ICD-10-CM | POA: Insufficient documentation

## 2019-08-01 DIAGNOSIS — I10 Essential (primary) hypertension: Secondary | ICD-10-CM | POA: Diagnosis not present

## 2019-08-01 DIAGNOSIS — Z952 Presence of prosthetic heart valve: Secondary | ICD-10-CM | POA: Diagnosis present

## 2019-08-05 ENCOUNTER — Encounter: Payer: Self-pay | Admitting: Physician Assistant

## 2019-08-05 ENCOUNTER — Telehealth (HOSPITAL_COMMUNITY): Payer: Self-pay | Admitting: Cardiology

## 2019-08-05 NOTE — Progress Notes (Signed)
Cardiology Office Note    Date:  08/07/2019   ID:  EVELIN CAKE, DOB Feb 13, 1945, MRN [id number]  PCP:  Coralee Rud, PA-C  Cardiologist:  Roxanne Mins, PA-C  Electrophysiologist:  None   Chief Complaint: "my primary care doctor wanted me to get checked out so I can be released"  History of Present Illness:   ELINOR KLEINE is a 74 y.o. female with history of remote atrial fibrillation (not on OAC) and subsequent monitor with no definite afib,history ofPE, hypothyroidism, CAD s/p orbital atherectomy/PCI with DES to RCA, prox Cx, and mid Cx 03/2019, severe AS s/p TAVR 03/2019, GERD, TIA (12/2018), DM, fibromyalgia who presents to clinic for follow-up. 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  She traditionally follows with Arnette Felts for her cardiology care. In 07/2018 she established with our structural heart team due to the presence of aortic stenosis. She underwent diagnostic cath in 08/2018 demonstrating 2V CAD with moderate AS at that time. In spring 2021 aortic stenosis progressed to severe so she underwent planned PCI as outlined above. She then went onto have TAVR on 04/22/19. Post TAVR echo 05/22/19 showed EF 60-65%, mild LVH, mild LAE, well seated TAVR valve (per structural team, gradients increased from POD1 echo where mean gradient 5 mm hg but still within an acceptable range). Dr. Rebecca Eaton Thompson's last notes also report a history of remote atrial fib with recent monitor with no definite afib, results not in Epic.  She is seen back today for follow-up. Per appointment notes, instructed to see [place] back by primary care. She tells me that Arnette Felts recommended she come in for evaluation to be released to the rest of her doctors to have her mammogram and routine care. She reports she's doing great from a cardiac  standpoint without any angina, dyspnea, palpitations. She does mention she might have to have left shoulder surgery at one point. We discussed that she would not be able to stop her blood thinners until 10/23/19 as previously outlined. She states she hasn't made the orthopedic appointment yet so it's not urgent. She lives at home with home health services and uses provided transportation for getting around. Otherwise she seems A+Ox3 today, albeit somewhat tangential. She asked me what her TAVR card was for.  She states she's been followed by psychiatry and feels like her medications have kept her steady.  She declined to have any friends/family phoned in on the visit today.    Labwork independently reviewed: 05/2019 Hgb 10.1 03/2019 K 4.2, Cr 0.53, LFTs ok Last recent LDL not on file   Past Medical History:  Diagnosis Date  . ANXIETY 07/31/2007  . Atrial fibrillation (HCC)    a. per structural notes, previous hx remote Afib.  [name] CAD (coronary artery disease)    a. 03/2019 orbital atherectomy/PCI with DES to RCA, prox Cx, and mid Cx.  [name] DEPRESSION 09/05/2006  . DIABETES MELLITUS, TYPE II   . Diabetic foot ulcer (HCC) 10/26/2014  . DIVERTICULOSIS, COLON 09/05/2006  . FIBROMYALGIA 05/10/2009  . GERD 09/05/2006  . HYPERLIPIDEMIA 09/05/2006  . HYPERTENSION 09/05/2006  . MRSA infection 10/26/2014  . OSTEOARTHRITIS 09/05/2006  . OSTEOPOROSIS 07/31/2007  . 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 Severe aortic  stenosis    s/p TAVR  . Unspecified hearing loss 10/09/2007    Past Surgical History:  Procedure Laterality Date  . ABDOMINAL HYSTERECTOMY  12/08   Bladder tac, partial hysterectomy  . BREAST BIOPSY    . CARDIAC CATHETERIZATION    . catarct removal     both eyes  . COLONOSCOPY    . CORONARY ATHERECTOMY N/A 04/15/2019   Procedure: CORONARY ATHERECTOMY;  Surgeon: [name], MD;  Location: [facility]  INVASIVE CV LAB;  Service: Cardiovascular;  Laterality: N/A;  . CORONARY STENT INTERVENTION N/A 04/15/2019   Procedure: CORONARY STENT INTERVENTION;  Surgeon: [name], MD;  Location: [facility] INVASIVE CV LAB;  Service: Cardiovascular;  Laterality: N/A;  . I & D KNEE WITH POLY EXCHANGE Left 01/03/2013   Procedure: IRRIGATION AND DEBRIDEMENT LEFT KNEE WITH POLY EXCHANGE;  Surgeon: Kathryne Hitch, MD;  Location: [facility] ORS;  Service: Orthopedics;  Laterality: Left;  . inguinal herniorrhapy     right Dr. Freida Busman 06/2009  . REVERSE SHOULDER ARTHROPLASTY Right 09/07/2016  . REVERSE SHOULDER ARTHROPLASTY Right 09/07/2016   Procedure: REVERSE SHOULDER ARTHROPLASTY;  Surgeon: Jones Broom, MD;  Location: [facility] OR;  Service: Orthopedics;  Laterality: Right;  Right reverse shoulder arthroplasty  . RIGHT/LEFT HEART CATH AND CORONARY ANGIOGRAPHY N/A 09/11/2018   Procedure: RIGHT/LEFT HEART CATH AND CORONARY ANGIOGRAPHY;  Surgeon: [name], MD;  Location: [facility] INVASIVE CV LAB;  Service: Cardiovascular;  Laterality: N/A;  . TEE WITHOUT CARDIOVERSION N/A 04/22/2019   Procedure: TRANSESOPHAGEAL ECHOCARDIOGRAM (TEE);  Surgeon: [name], MD;  Location: [facility] INVASIVE CV LAB;  Service: Open Heart Surgery;  Laterality: N/A;  . TONSILLECTOMY    . TOTAL KNEE ARTHROPLASTY Left 12/20/2012   Procedure: LEFT TOTAL KNEE ARTHROPLASTY;  Surgeon: Kathryne Hitch, MD;  Location: [facility] ORS;  Service: Orthopedics;  Laterality: Left;  . TRANSCATHETER AORTIC VALVE REPLACEMENT, TRANSFEMORAL N/A 04/22/2019   Procedure: TRANSCATHETER AORTIC VALVE REPLACEMENT, TRANSFEMORAL;  Surgeon: [name], MD;  Location: [facility] INVASIVE CV LAB;  Service: Open Heart Surgery;  Laterality: N/A;    Current Medications: Current Meds  Medication Sig  . albuterol (VENTOLIN HFA) 108 (90 Base) MCG/ACT inhaler Inhale 1 puff into the lungs 2 (two) times daily as needed for wheezing or shortness of breath.   [name] ascorbic acid (VITAMIN C) 500 MG  tablet Take 500 mg by mouth daily.  [name] aspirin EC 81 MG tablet Take 81 mg by mouth daily.   . benztropine (COGENTIN) 0.5 MG tablet Take 0.5 mg by mouth 2 (two) times daily.    . calcium-vitamin D (OSCAL WITH D) 500-200 MG-UNIT tablet Take 1 tablet by mouth 3 (three) times daily.  . cetirizine (ZYRTEC) 10 MG tablet TAKE 1 TABLET BY MOUTH DAILY  . clindamycin (CLEOCIN) 300 MG capsule Take 600 mg (2 capsules) by mouth 60 minutes prior to all dental procedures.  . clopidogrel (PLAVIX) 75 MG tablet Take 1 tablet (75 mg total) by mouth daily.  . diclofenac sodium (VOLTAREN) 1 % GEL Apply 2-4 g topically 4 (four) times daily as needed (Pain).   . fluticasone (FLONASE) 50 MCG/ACT nasal spray Place 1 spray into both nostrils daily as needed for allergies.   [name] gabapentin (NEURONTIN) 100 MG capsule Take 100 mg by mouth 3 (three) times daily.   . hydrOXYzine (ATARAX/VISTARIL) 10 MG tablet Take 1 tablet (10 mg total) by mouth 2 (two) times daily as needed.  [name] ipratropium (ATROVENT) 0.06 % nasal spray USE 1 SPRAY IN EACH NOSTRIL THREE TIMES DAILY  .  metFORMIN (GLUCOPHAGE) 500 MG tablet Take 500 mg by mouth 2 (two) times daily.   . Multiple Vitamin (MULTIVITAMIN WITH MINERALS) TABS tablet Take 1 tablet by mouth daily.  [name] NAMZARIC 7 & 14 & 21 &28 -10 MG C4PK SMARTSIG:Capsule(s) By Mouth As Directed  . ONETOUCH DELICA LANCETS 33G MISC CHECK BLOOD SUGAR ONCE DAILY AS DIRECTED  . pantoprazole (PROTONIX) 40 MG tablet TAKE 1 TABLET BY MOUTH DAILY  . Potassium Chloride ER 20 MEQ TBCR Take 20 mEq by mouth 2 (two) times daily.   . risperiDONE (RISPERDAL) 3 MG tablet Take 3 mg by mouth at bedtime.   . rosuvastatin (CRESTOR) 40 MG tablet TAKE 1 TABLET(40 MG) BY MOUTH DAILY AT 6 PM  . UREA 20 INTENSIVE HYDRATING 20 % cream Apply 1 application topically daily as needed (dry skin). For dry skin      Allergies:   Penicillins, Pollen extract, Coconut oil, and Mellaril [thioridazine]   Social History   Socioeconomic  History  . Marital status: Single    Spouse name: Not on file  . Number of children: 0  . Years of education: 37  . Highest education level: High school graduate  Occupational History  . Occupation: retired [employer]    Employer: DISABLED  Tobacco Use  . Smoking status: Never Smoker  . Smokeless tobacco: Never Used  Vaping Use  . Vaping Use: Never used  Substance and Sexual Activity  . Alcohol use: No    Alcohol/week: 0.0 standard drinks  . Drug use: No  . Sexual activity: Not on file  Other Topics Concern  . Not on file  Social History Narrative   LIves alone with home health services.   Social Determinants of Health   Financial Resource Strain:   . Difficulty of Paying Living Expenses:   Food Insecurity:   . Worried About Programme researcher, broadcasting/film/video in the Last Year:   . Barista in the Last Year:   Transportation Needs:   . Freight forwarder (Medical):   [name] Lack of Transportation (Non-Medical):   Physical Activity:   . Days of Exercise per Week:   . Minutes of Exercise per Session:   Stress:   . Feeling of Stress :   Social Connections:   . Frequency of Communication with Friends and Family:   . Frequency of Social Gatherings with Friends and Family:   . Attends Religious Services:   . Active Member of Clubs or Organizations:   . Attends Banker Meetings:   [name] Marital Status:      Family History:  [name] family history includes Coronary artery disease in an other family member; Diabetes in an other family member. There is no history of Colon cancer, Esophageal cancer, Stomach cancer, or Rectal cancer.  ROS:   Please see the history of present illness.  All other systems are reviewed and otherwise negative.    EKGs/Labs/Other Studies Reviewed:    Studies reviewed are outlined and summarized above. Reports included below if pertinent.  2D echo 05/22/19  1. Left ventricular ejection fraction, by estimation, is 60 to 65%.  The  left ventricle has normal function. The left ventricle has no regional  wall motion abnormalities. There is mild left ventricular hypertrophy.  Left ventricular diastolic parameters  were normal.  2. Right ventricular systolic function is normal. The right ventricular  size is normal. There is normal pulmonary artery systolic pressure.  3. Left atrial size was mildly dilated.  4. The mitral valve is normal in structure. Trivial mitral valve  regurgitation. No evidence of mitral stenosis.  5. Well seated 26 mm Sapien 3 Ultra valve with no PVL. Gradients have  increased since 04/23/19 day one post implant when mean gradient was 5 mmHg  and peak 9 mmHg . The aortic valve has been repaired/replaced. Aortic  valve regurgitation is not visualized.  No aortic stenosis is present. There is a 26 mm Edwards Sapien prosthetic  (TAVR) valve present in the aortic position. Procedure Date: 04/22/19.  6. The inferior vena cava is normal in size with greater than 50%  respiratory variability, suggesting right atrial pressure of 3 mmHg.    LHC/PCI 04/15/19 Successful two-vessel orbital atherectomy and PCI using a 3.0 x 20 mm resolute Onyx DES in the RCA, 3.0 x 8 mm resolute Onyx DES in the proximal circumflex, and 2.5 x 26 mm resolute Onyx DES in the mid circumflex  Recommendation: Continue aspirin and clopidogrel without interruption for at least 6 months.  Continue with plans for TAVR for treatment of severe symptomatic aortic stenosis.     EKG:  EKG is ordered today, personally reviewed, demonstrating NSR 68bpm, poor R wave progression, nonspecific TW changes, no acute change from prior  Recent Labs: 04/18/2019: ALT 30; B Natriuretic Peptide 97.7 04/23/2019: Magnesium 2.0 04/24/2019: BUN 17; Creatinine, Ser 0.53; Hemoglobin 9.0; Platelets 249; Potassium 4.2; Sodium 138  Recent Lipid Panel    Component Value Date/Time   CHOL 140 06/15/2015 1349   TRIG 148.0 06/15/2015 1349   TRIG 104  01/24/2006 0956   HDL 39.00 (L) 06/15/2015 1349   CHOLHDL 4 06/15/2015 1349   VLDL 29.6 06/15/2015 1349   LDLCALC 72 06/15/2015 1349   LDLDIRECT 66.0 06/16/2014 1437    PHYSICAL EXAM:    VS:  BP 120/80   Pulse 76   Ht 4' 10.5" (1.486 m)   Wt 144 lb 3.2 oz (65.4 kg)   SpO2 96%   BMI 29.62 kg/m   BMI: Body mass index is 29.62 kg/m.  GEN: Well nourished, well developed WF in no acute distress HEENT: normocephalic, atraumatic Neck: no JVD, carotid bruits, or masses Cardiac: RRR; no murmurs, rubs, or gallops, no edema  Respiratory:  clear to auscultation bilaterally, normal work of breathing GI: soft, nontender, nondistended, + BS MS: kyphotic posture Skin: warm and dry, no rash Neuro:  Alert and Oriented x 3, Strength and sensation are intact, follows commands, no focal deficit Psych: euthymic mood, full affect, tangential but friendly and engaging  Wt Readings from Last 3 Encounters:  08/07/19 144 lb 3.2 oz (65.4 kg)  06/19/19 148 lb 2.4 oz (67.2 kg)  05/22/19 148 lb (67.1 kg)     ASSESSMENT & PLAN:    1. CAD - it is not entirely clear why this appointment was requested today. We called Forde RadonMike Duran's office and the staff indicated there wasn't anything in the recent notes about seeing us back. The patient is a poor historian in general, but very cheerful and A+Ox3 although tangential at times. She appears to be doing well from a cardiac standpoint. EKG shows on acute changes. She will continue DAPT and statin. Per previous structural heart team instructions, she can stop the Plavix October 23, 2019. She was reminded of these instructions today (she recalled being told this in the past). She will continue aspirin indefinitely as tolerated. I do not see where she has had recent lipids in our system but she reports this is being  checked by her primary care. She had recent LFTs that were OK. I encouraged regular follow-up with her PCP for her cholesterol management. 2. History of  TAVR for severe AS - appears to be doing well clinically. Antiplatelet management as above. Reminded her of need for SBE ppx. 3. Remote history of atrial fibrillation - quiescent, maintaining NSR. Follow clinically for recurrence. If this were seen in the future, would need consideration of anticoagulation. 4. Pre-op cardiovascular examination - we discussed that she would not be allowed to stop her Plavix until 10/23/19. It is difficult to say at today's visit that she would be definitively cleared since this is still so far out. She has not even attended any appointments making formal plans yet. If it is determined she will definitely need surgery, I would suggest sending the clearance through our preop box so that our office can call her closer to time of procedure to review how she is doing.  Disposition: F/u with the structural heart team in 04/2020 as previously outlined. Will route to Katie/Dr. Excell Seltzer to ensure this is in the queue as we could not see the recall from our end.  Medication Adjustments/Labs and Tests Ordered: Current medicines are reviewed at length with the patient today.  Concerns regarding medicines are outlined above. Medication changes, Labs and Tests ordered today are summarized above and listed in the Patient Instructions accessible in Encounters.   Signed, Laurann Montana, PA-C  08/07/2019 3:33 PM    [facility] [address] [place], [place], [place]  [address] Phone: [phone]; Fax: [phone]

## 2019-08-06 ENCOUNTER — Encounter (HOSPITAL_COMMUNITY): Admission: RE | Admit: 2019-08-06 | Payer: Medicare Other | Source: Ambulatory Visit

## 2019-08-06 ENCOUNTER — Other Ambulatory Visit: Payer: Self-pay

## 2019-08-06 ENCOUNTER — Encounter (HOSPITAL_COMMUNITY): Payer: Medicare Other

## 2019-08-07 ENCOUNTER — Ambulatory Visit (INDEPENDENT_AMBULATORY_CARE_PROVIDER_SITE_OTHER): Payer: Medicare Other | Admitting: Physician Assistant

## 2019-08-07 ENCOUNTER — Encounter: Payer: Self-pay | Admitting: Physician Assistant

## 2019-08-07 ENCOUNTER — Other Ambulatory Visit: Payer: Self-pay

## 2019-08-07 VITALS — BP 120/80 | HR 76 | Ht 58.5 in | Wt 144.2 lb

## 2019-08-07 DIAGNOSIS — Z01810 Encounter for preprocedural cardiovascular examination: Secondary | ICD-10-CM | POA: Diagnosis not present

## 2019-08-07 DIAGNOSIS — I4891 Unspecified atrial fibrillation: Secondary | ICD-10-CM | POA: Diagnosis not present

## 2019-08-07 DIAGNOSIS — Z952 Presence of prosthetic heart valve: Secondary | ICD-10-CM

## 2019-08-07 DIAGNOSIS — I2510 Atherosclerotic heart disease of native coronary artery without angina pectoris: Secondary | ICD-10-CM | POA: Diagnosis not present

## 2019-08-07 NOTE — Patient Instructions (Addendum)
Medication Instructions:  Your physician recommends that you continue on your current medications as directed, BUT Remember these instructions from a previous visit: 1) you may STOP PLAVIX October 23, 2019 2) Do not forget to take your CLINDAMYCIN 600 mg 1 hour prior to your dental visits!  Please touch base with your primary care doctor to make sure your cholesterol has been checked recently.   *If you need a refill on your cardiac medications before your next appointment, please call your pharmacy*   Lab Work: None ordered  If you have labs (blood work) drawn today and your tests are completely normal, you will receive your results only by: [name] MyChart Message (if you have MyChart) OR . A paper copy in the mail If you have any lab test that is abnormal or we need to change your treatment, we will call you to review the results.   Testing/Procedures: None ordered   Follow-Up: At [facility], you and your health needs are our priority.  As part of our continuing mission to provide you with exceptional heart care, we have created designated Provider Care Teams.  These Care Teams include your primary Cardiologist (physician) and Advanced Practice Providers (APPs -  Physician Assistants and Nurse Practitioners) who all work together to provide you with the care you need, when you need it.  We recommend signing up for the patient portal called "MyChart".  Sign up information is provided on this After Visit Summary.  MyChart is used to connect with patients for Virtual Visits (Telemedicine).  Patients are able to view lab/test results, encounter notes, upcoming appointments, etc.  Non-urgent messages can be sent to your provider as well.   To learn more about what you can do with MyChart, go to [email or web address].    Your next appointment:   Follow-up as directed

## 2019-08-08 ENCOUNTER — Encounter (HOSPITAL_COMMUNITY): Payer: Medicare Other

## 2019-08-11 ENCOUNTER — Encounter (HOSPITAL_COMMUNITY): Payer: Medicare Other

## 2019-08-13 ENCOUNTER — Other Ambulatory Visit: Payer: Self-pay

## 2019-08-13 ENCOUNTER — Encounter (HOSPITAL_COMMUNITY)
Admission: RE | Admit: 2019-08-13 | Discharge: 2019-08-13 | Disposition: A | Discharge status: Home / Self Care | Payer: Medicare Other | Source: Ambulatory Visit | Attending: Cardiovascular Disease | Admitting: Cardiovascular Disease

## 2019-08-13 VITALS — Ht <= 58 in | Wt 143.5 lb

## 2019-08-13 DIAGNOSIS — Z952 Presence of prosthetic heart valve: Secondary | ICD-10-CM

## 2019-08-13 DIAGNOSIS — Z955 Presence of coronary angioplasty implant and graft: Secondary | ICD-10-CM

## 2019-08-15 ENCOUNTER — Other Ambulatory Visit: Payer: Self-pay

## 2019-08-15 ENCOUNTER — Encounter (HOSPITAL_COMMUNITY)
Admission: RE | Admit: 2019-08-15 | Discharge: 2019-08-15 | Disposition: A | Discharge status: Home / Self Care | Payer: Medicare Other | Source: Ambulatory Visit | Attending: Cardiovascular Disease | Admitting: Cardiovascular Disease

## 2019-08-15 DIAGNOSIS — Z952 Presence of prosthetic heart valve: Secondary | ICD-10-CM

## 2019-08-15 DIAGNOSIS — Z955 Presence of coronary angioplasty implant and graft: Secondary | ICD-10-CM

## 2019-08-15 NOTE — Progress Notes (Signed)
Discharge Progress Report  Patient Details  Name: Katherine [name] MRN: [id number] Date of Birth: 1946-01-23 Referring Provider:     [facility] from 06/19/2019 in [place]  Referring Provider Sherren Mocha MD       Number of Visits: 17  Reason for Discharge:  Patient reached a stable level of exercise.  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  ADL [facility]:   Initial Exercise Prescription:  Initial Exercise Prescription - 06/19/19 1400      Date of Initial Exercise RX and Referring Provider   Date 06/19/19    Referring Provider Sherren Mocha MD    Expected Discharge Date 08/15/19      NuStep   Level 1    SPM 75    Minutes 25    METs 1.8      Prescription Details   Frequency (times per week) 3x    Duration Progress to 30 minutes of continuous aerobic without signs/symptoms of physical distress      Intensity   THRR 40-80% of Max Heartrate 59-117    Ratings of Perceived Exertion 11-13    Perceived Dyspnea 0-4      Progression   Progression Continue progressive overload as per policy without signs/symptoms or physical distress.      Resistance Training   Training Prescription Yes    Weight 2lbs    Reps 10-15           Discharge Exercise Prescription (Final Exercise Prescription Changes):  Exercise Prescription Changes - 08/15/19 1600      Response to Exercise   Blood Pressure (Admit) 122/80    Blood Pressure (Exercise) 134/72    Blood Pressure (Exit) 124/72    Heart Rate (Admit) 77 bpm    Heart Rate (Exercise) 90 bpm    Heart Rate (Exit) 77 bpm    Rating of Perceived Exertion (Exercise) 13    Symptoms None    Comments Pt completed CRP2 program today    Duration Progress to 30 minutes of  aerobic without signs/symptoms of  physical distress    Intensity THRR unchanged      Progression   Progression Continue to progress workloads to maintain intensity without signs/symptoms of physical distress.    Average METs 1.4      Resistance Training   Training Prescription Yes    Weight 2    Reps 10-15    Time 10 Minutes      Interval Training   Interval Training No      NuStep   Level 2    SPM 50    Minutes 30    METs 1.4           Functional Capacity:  6 Minute Walk    Row Name 06/19/19 1453 08/13/19 1625       6 Minute Walk   Phase Initial Discharge    Distance 685 feet 1000 feet    Distance % Change -- 45.99 %    Distance Feet Change -- 315 ft    Walk Time 6 minutes 6 minutes    # of Rest Breaks 1 1    MPH 1.3 1.89    METS 1.3 1.9    RPE 12 13    Perceived Dyspnea  1 1  VO2 Peak 4.7 6.67    Symptoms Yes (comment) Yes (comment)    Comments Mild SOB +1, Pt took one 27 second rest break SOB, RPD=1. 1 break 45 seconds (5:15 - 6:00)    Resting HR 70 bpm 86 bpm    Resting BP 118/70 100/60    Resting Oxygen Saturation  97 % 97 %    Exercise Oxygen Saturation  during 6 min walk 97 % 98 %    Max Ex. HR 96 bpm 93 bpm    Max Ex. BP 144/82 144/80    2 Minute Post BP 118/68 142/70           Psychological, QOL, Others - Outcomes: PHQ 2/9: Depression screen [facility] 2/9 06/20/2019 06/15/2015 03/30/2014 08/08/2013 11/07/2012  Decreased Interest 0 0 0 0 0  Down, Depressed, Hopeless 0 1 0 1 0  PHQ - 2 Score 0 1 0 1 0  Some recent data might be hidden    Quality of Life:  Quality of Life - 08/13/19 1445      Quality of Life Scores   Health/Function Post 23.57 %    Socioeconomic Post 20.92 %    Psych/Spiritual Post 30 %    Family Post 21 %    GLOBAL Post 24.18 %           Personal Goals: Goals established at orientation with interventions provided to work toward goal.  Personal Goals and Risk Factors at Admission - 06/20/19 1020      Core Components/Risk Factors/Patient Goals on  Admission    Weight Management Yes;Obesity    Intervention Weight Management: Develop a combined nutrition and exercise program designed to reach desired caloric intake, while maintaining appropriate intake of nutrient and fiber, sodium and fats, and appropriate energy expenditure required for the weight goal.;Weight Management: Provide education and appropriate resources to help participant work on and attain dietary goals.;Weight Management/Obesity: Establish reasonable short term and long term weight goals.;Obesity: Provide education and appropriate resources to help participant work on and attain dietary goals.    Expected Outcomes Short Term: Continue to assess and modify interventions until short term weight is achieved;Long Term: Adherence to nutrition and physical activity/exercise program aimed toward attainment of established weight goal;Weight Maintenance: Understanding of the daily nutrition guidelines, which includes 25-35% calories from fat, 7% or less cal from saturated fats, less than 265m cholesterol, less than 1.5gm of sodium, & 5 or more servings of fruits and vegetables daily;Weight Loss: Understanding of general recommendations for a balanced deficit meal plan, which promotes 1-2 lb weight loss per week and includes a negative energy balance of [phone] kcal/d;Understanding recommendations for meals to include 15-35% energy as protein, 25-35% energy from fat, 35-60% energy from carbohydrates, less than 2065mof dietary cholesterol, 20-35 gm of total fiber daily;Understanding of distribution of calorie intake throughout the day with the consumption of 4-5 meals/snacks    Diabetes Yes    Intervention Provide education about signs/symptoms and action to take for hypo/hyperglycemia.;Provide education about proper nutrition, including hydration, and aerobic/resistive exercise prescription along with prescribed medications to achieve blood glucose in normal ranges: Fasting glucose 65-99 mg/dL     Expected Outcomes Long Term: Attainment of HbA1C < 7%.;Short Term: Participant verbalizes understanding of the signs/symptoms and immediate care of hyper/hypoglycemia, proper foot care and importance of medication, aerobic/resistive exercise and nutrition plan for blood glucose control.    Hypertension Yes    Intervention Provide education on lifestyle modifcations including regular physical activity/exercise, weight management, moderate  sodium restriction and increased consumption of fresh fruit, vegetables, and low fat dairy, alcohol moderation, and smoking cessation.;Monitor prescription use compliance.    Expected Outcomes Short Term: Continued assessment and intervention until BP is < 140/53m HG in hypertensive participants. < 130/863mHG in hypertensive participants with diabetes, heart failure or chronic kidney disease.;Long Term: Maintenance of blood pressure at goal levels.    Lipids Yes    Intervention Provide education and support for participant on nutrition & aerobic/resistive exercise along with prescribed medications to achieve LDL <7024mHDL >64m53m  Expected Outcomes Short Term: Participant states understanding of desired cholesterol values and is compliant with medications prescribed. Participant is following exercise prescription and nutrition guidelines.;Long Term: Cholesterol controlled with medications as prescribed, with individualized exercise RX and with personalized nutrition plan. Value goals: LDL < 70mg60mL > 40 mg.    Stress Yes    Intervention Offer individual and/or small group education and counseling on adjustment to heart disease, stress management and health-related lifestyle change. Teach and support self-help strategies.    Expected Outcomes Short Term: Participant demonstrates changes in health-related behavior, relaxation and other stress management skills, ability to obtain effective social support, and compliance with psychotropic medications if  prescribed.;Long Term: Emotional wellbeing is indicated by absence of clinically significant psychosocial distress or social isolation.            Personal Goals Discharge:  Goals and Risk Factor Review    Row Name 06/23/19 1639 06/24/19 1114 07/03/19 1638 07/24/19 1600       Core Components/Risk Factors/Patient Goals Review   Personal Goals Review Weight Management/Obesity;Lipids;Stress;Hypertension -- Weight Management/Obesity;Lipids;Stress;Hypertension Weight Management/Obesity;Lipids;Stress;Hypertension    Review Katherine [name] did fair with her first day of exercise on 06/23/19. Vital signs were stable. Katherine [name] did fair with her first day of exercise on 06/23/19. Vital signs were stable. Katherine [name] is deconditioned. Katherine [name] has done as well as can be expected due to her deconditioned state. Katherine [name] is pleasant and enjoys participating in the program Katherine [name] continues to exercise at cardiac rehab mainly on the nustep with light stretching . Katherine [name] is pleasant and enjoys participating in the program. Katherine [name]'s met level remains low.    Expected Outcomes Katherine [name] will continue to participate in phase 2 cardiac rehab for exercise nutrtion and lifestyle modifications -- Katherine [name] will continue to participate in phase 2 cardiac rehab for exercise nutrtion and lifestyle modifications Katherine [name] will continue to participate in phase 2 cardiac rehab for exercise nutrtion and lifestyle modifications           Exercise Goals and Review:  Exercise Goals    Row Name 06/19/19 1457             Exercise Goals   Increase Physical Activity Yes       Intervention Provide advice, education, support and counseling about physical activity/exercise needs.;Develop an individualized exercise prescription for aerobic and resistive training based on initial evaluation findings, risk stratification, comorbidities and participant's personal goals.       Expected Outcomes Short Term: Attend rehab on a regular basis to increase amount of physical activity.;Long Term: Add  in home exercise to make exercise part of routine and to increase amount of physical activity.;Long Term: Exercising regularly at least 3-5 days a week.       Increase Strength and Stamina Yes       Intervention Provide advice, education, support and counseling about physical activity/exercise needs.;Develop an individualized exercise prescription for aerobic and resistive training based on initial evaluation findings,  risk stratification, comorbidities and participant's personal goals.       Expected Outcomes Short Term: Increase workloads from initial exercise prescription for resistance, speed, and METs.;Short Term: Perform resistance training exercises routinely during rehab and add in resistance training at home;Long Term: Improve cardiorespiratory fitness, muscular endurance and strength as measured by increased METs and functional capacity (6MWT)       Able to understand and use rate of perceived exertion (RPE) scale Yes       Intervention Provide education and explanation on how to use RPE scale       Expected Outcomes Short Term: Able to use RPE daily in rehab to express subjective intensity level;Long Term:  Able to use RPE to guide intensity level when exercising independently       Knowledge and understanding of Target Heart Rate Range (THRR) Yes       Intervention Provide education and explanation of THRR including how the numbers were predicted and where they are located for reference       Expected Outcomes Short Term: Able to state/look up THRR;Long Term: Able to use THRR to govern intensity when exercising independently;Short Term: Able to use daily as guideline for intensity in rehab       Able to check pulse independently Yes       Intervention Provide education and demonstration on how to check pulse in carotid and radial arteries.;Review the importance of being able to check your own pulse for safety during independent exercise       Expected Outcomes Short Term: Able to explain why  pulse checking is important during independent exercise;Long Term: Able to check pulse independently and accurately       Understanding of Exercise Prescription Yes       Intervention Provide education, explanation, and written materials on patient's individual exercise prescription       Expected Outcomes Short Term: Able to explain program exercise prescription;Long Term: Able to explain home exercise prescription to exercise independently              Exercise Goals Re-Evaluation:  Exercise Goals Re-Evaluation    Row Name 06/23/19 1609 07/21/19 1610 08/15/19 1623         Exercise Goal Re-Evaluation   Exercise Goals Review Increase Physical Activity Increase Physical Activity;Increase Strength and Stamina Increase Physical Activity;Increase Strength and Stamina     Comments Patient tolerated low intensity exercise without symptoms. Pt continues to exercise at low level without symptoms. Pt requires frequent direction, due to memory issues, to keep SPM on nustep at desired level. Pt graduated the [place] program today. Pt had good attendance and had an average MET level of 1.4.     Expected Outcomes Increase workloads as tolerated to help improve cardiorespiratory fitness. Will continue to increase workloads as tolerated. Pt encouraged to walk inside her home.            Nutrition & Weight - Outcomes:  Pre Biometrics - 06/19/19 1455      Pre Biometrics   Height 4' 9.7" (1.466 m)    Weight 67.2 kg    Waist Circumference 44.5 inches    Hip Circumference 45.5 inches    Waist to Hip Ratio 0.98 %    BMI (Calculated) 31.27    Triceps Skinfold 22 mm    % Body Fat 44.9 %    Grip Strength 18 kg    Flexibility 0 in    Single Leg Stand 0 seconds  Post Biometrics - 08/13/19 1430       Post  Biometrics   Height 4' 9.5" (1.461 m)    Weight 65.1 kg    Waist Circumference 45 inches    Hip Circumference 45 inches    Waist to Hip Ratio 1 %    BMI (Calculated) 30.5    Triceps  Skinfold 21.5 mm    % Body Fat 44.4 %    Grip Strength 16 kg    Flexibility 0 in    Single Leg Stand 0 seconds           Nutrition:   Nutrition Discharge:   Education Questionnaire Score:  Knowledge Questionnaire Score - 06/19/19 1509      Knowledge Questionnaire Score   Pre Score 19/24          Katherine [name] graduated from cardiac rehab program on 08/15/19 with completion of 17 exercise sessions in Phase II. Pt maintained good attendance. Katherine [name] did not increase her met level on the nustep . Repeat  PHQ score- 0 .   Pt feels she has achieved her goals during cardiac rehab.   Pt plans to continue to walk about in her apartment as she is able. Katherine [name] increased her distance on her post exercise walk test by 315 feet and lost 2.1 kg. We are proud of Katherine [name]'s progress.Barnet Pall, RN,BSN 08/28/2019 1:30 PM

## 2019-08-25 ENCOUNTER — Ambulatory Visit (INDEPENDENT_AMBULATORY_CARE_PROVIDER_SITE_OTHER): Payer: Medicare Other

## 2019-08-25 ENCOUNTER — Other Ambulatory Visit: Payer: Self-pay

## 2019-08-25 ENCOUNTER — Ambulatory Visit (HOSPITAL_COMMUNITY)
Admission: EM | Admit: 2019-08-25 | Discharge: 2019-08-25 | Disposition: A | Discharge status: Home / Self Care | Payer: Medicare Other | Attending: Family Medicine | Admitting: Family Medicine

## 2019-08-25 ENCOUNTER — Encounter (HOSPITAL_COMMUNITY): Payer: Self-pay

## 2019-08-25 DIAGNOSIS — W19XXXA Unspecified fall, initial encounter: Secondary | ICD-10-CM

## 2019-08-25 DIAGNOSIS — S39012A Strain of muscle, fascia and tendon of lower back, initial encounter: Secondary | ICD-10-CM

## 2019-08-25 DIAGNOSIS — M545 Low back pain: Secondary | ICD-10-CM

## 2019-08-25 DIAGNOSIS — Y92009 Unspecified place in unspecified non-institutional (private) residence as the place of occurrence of the external cause: Secondary | ICD-10-CM | POA: Diagnosis not present

## 2019-08-25 MED ORDER — TRAMADOL HCL 50 MG PO TABS: 50.0000 mg | ORAL_TABLET | Freq: Four times a day (QID) | ORAL | 0 refills | Status: DC | PRN

## 2019-08-25 NOTE — ED Triage Notes (Signed)
Pt is here with back pain that started after falling off her friend's couch. Pt has taken Tylenol to relieve discomfort, pt has requested she be prescribed Tramadol.

## 2019-08-25 NOTE — Discharge Instructions (Signed)
Take tramadol as needed for pain Follow-up with your primary care doctor

## 2019-08-25 NOTE — ED Provider Notes (Signed)
[facility]    CSN: [id number] Arrival date & time: 08/25/19  1140      History   Chief Complaint Chief Complaint  Patient presents with  . Back Pain    HPI Katherine [name] is a 74 y.o. female.   HPI  Patient has multiple injuries of falling off of a couch several days ago.  She has bruising on her right elbow.  Bruising on her left lower leg.  Pain in her low back.  She states that the arm and leg are not painful.  The low back pain has been unremitting since she fell.  It is in the central low back.  No radiation.  No bowel or bladder complaint.  She is a chronic back pain and problems in the past.  Known osteoporosis.   Past Medical History:  Diagnosis Date  . ANXIETY 07/31/2007  . Atrial fibrillation (HCC)    a. per structural notes, previous hx remote Afib.  [name] CAD (coronary artery disease)    a. 03/2019 orbital atherectomy/PCI with DES to RCA, prox Cx, and mid Cx.  [name] DEPRESSION 09/05/2006  . DIABETES MELLITUS, TYPE II   . Diabetic foot ulcer (HCC) 10/26/2014  . DIVERTICULOSIS, COLON 09/05/2006  . FIBROMYALGIA 05/10/2009  . GERD 09/05/2006  . HYPERLIPIDEMIA 09/05/2006  . HYPERTENSION 09/05/2006  . MRSA infection 10/26/2014  . OSTEOARTHRITIS 09/05/2006  . OSTEOPOROSIS 07/31/2007  . 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 Severe aortic stenosis    s/p TAVR  . Unspecified hearing loss 10/09/2007    Patient Active Problem List   Diagnosis Date Noted  . 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 Type 2 diabetes mellitus with complication (HCC)   . Coronary artery disease involving native coronary artery of native heart with angina pectoris (HCC)   . 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 MRSA infection 10/26/2014  . Diabetic foot ulcer (HCC) 10/26/2014  . Pre-ulcerative corn or callous 10/09/2013  . Altered mental status  04/29/2013  . Normocytic anemia 01/06/2013  . 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 Thyroid nodule 11/07/2012  . Severe aortic stenosis 05/27/2012  . FRACTURE, PELVIS, RIGHT 04/15/2010  . Fibromyalgia 05/10/2009  . Anxiety state 07/31/2007  . Osteoporosis 07/31/2007  . Hyperlipidemia 09/05/2006  . DEPRESSION 09/05/2006  . Essential hypertension 09/05/2006  . Allergic rhinitis 09/05/2006  . GERD 09/05/2006  . DIVERTICULOSIS, COLON 09/05/2006  . OSTEOARTHRITIS 09/05/2006    Past Surgical History:  Procedure Laterality Date  . ABDOMINAL HYSTERECTOMY  12/08   Bladder tac, partial hysterectomy  . BREAST BIOPSY    . CARDIAC CATHETERIZATION    . catarct removal     both eyes  . COLONOSCOPY    . CORONARY ATHERECTOMY N/A 04/15/2019   Procedure: CORONARY ATHERECTOMY;  Surgeon: [name], MD;  Location: [facility] INVASIVE CV LAB;  Service: Cardiovascular;  Laterality: N/A;  . CORONARY STENT INTERVENTION N/A 04/15/2019   Procedure: CORONARY STENT INTERVENTION;  Surgeon: [name], MD;  [facility];  Service: Cardiovascular;  Laterality: N/A;  . I & D KNEE WITH POLY EXCHANGE Left 01/03/2013   Procedure: IRRIGATION AND DEBRIDEMENT LEFT KNEE WITH POLY EXCHANGE;  Surgeon: Kathryne Hitch, MD;  Location: [facility] ORS;  Service: Orthopedics;  Laterality: Left;  . inguinal herniorrhapy     right Dr. Freida Busman 06/2009  .  REVERSE SHOULDER ARTHROPLASTY Right 09/07/2016  . REVERSE SHOULDER ARTHROPLASTY Right 09/07/2016   Procedure: REVERSE SHOULDER ARTHROPLASTY;  Surgeon: Jones Broomhandler, Justin, MD;  [facility];  Service: Orthopedics;  Laterality: Right;  Right reverse shoulder arthroplasty  . RIGHT/LEFT HEART CATH AND CORONARY ANGIOGRAPHY N/A 09/11/2018   Procedure: RIGHT/LEFT HEART CATH AND CORONARY ANGIOGRAPHY;  Surgeon: Tonny Bollmanooper, Michael, MD;  [facility];  Service: Cardiovascular;  Laterality: N/A;  . TEE WITHOUT CARDIOVERSION N/A 04/22/2019    Procedure: TRANSESOPHAGEAL ECHOCARDIOGRAM (TEE);  Surgeon: Tonny Bollmanooper, Michael, MD;  [facility];  Service: Open Heart Surgery;  Laterality: N/A;  . TONSILLECTOMY    . TOTAL KNEE ARTHROPLASTY Left 12/20/2012   Procedure: LEFT TOTAL KNEE ARTHROPLASTY;  Surgeon: Kathryne Hitchhristopher Y Blackman, MD;  Location: [facility];  Service: Orthopedics;  Laterality: Left;  . TRANSCATHETER AORTIC VALVE REPLACEMENT, TRANSFEMORAL N/A 04/22/2019   Procedure: TRANSCATHETER AORTIC VALVE REPLACEMENT, TRANSFEMORAL;  Surgeon: Tonny Bollmanooper, Michael, MD;  [facility];  Service: Open Heart Surgery;  Laterality: N/A;    OB History   No obstetric history on file.      Home Medications    Prior to Admission medications   Medication Sig Start Date End Date Taking? Authorizing Provider  albuterol (VENTOLIN HFA) 108 (90 Base) MCG/ACT inhaler Inhale 1 puff into the lungs 2 (two) times daily as needed for wheezing or shortness of breath.  08/16/18   [provider]  ascorbic acid (VITAMIN C) 500 MG tablet Take 500 mg by mouth daily.    [provider]  aspirin EC 81 MG tablet Take 81 mg by mouth daily.     [provider]  benztropine (COGENTIN) 0.5 MG tablet Take 0.5 mg by mouth 2 (two) times daily.      [provider]  calcium-vitamin D (OSCAL WITH D) 500-200 MG-UNIT tablet Take 1 tablet by mouth 3 (three) times daily. 02/25/15   [name], [name], MD  cetirizine (ZYRTEC) 10 MG tablet TAKE 1 TABLET BY MOUTH DAILY 11/02/15   [name], [name], MD  clindamycin (CLEOCIN) 300 MG capsule Take 600 mg (2 capsules) by mouth 60 minutes prior to all dental procedures. 05/29/19   Janetta Horahompson, Kathryn R, PA-C  clopidogrel (PLAVIX) 75 MG tablet Take 1 tablet (75 mg total) by mouth daily. 06/02/19   Tonny Bollmanooper, Michael, MD  diclofenac sodium (VOLTAREN) 1 % GEL Apply 2-4 g topically 4 (four) times daily as needed (Pain).  04/10/18   [provider]  fluticasone (FLONASE) 50 MCG/ACT nasal spray Place  1 spray into both nostrils daily as needed for allergies.  06/05/18   [provider]  gabapentin (NEURONTIN) 100 MG capsule Take 100 mg by mouth 3 (three) times daily.  04/20/16   [provider]  hydrOXYzine (ATARAX/VISTARIL) 10 MG tablet Take 1 tablet (10 mg total) by mouth 2 (two) times daily as needed. 12/11/13   [name], [name], MD  ipratropium (ATROVENT) 0.06 % nasal spray USE 1 SPRAY IN [facility] NOSTRIL THREE TIMES DAILY 07/26/15   [name], [name], MD  metFORMIN (GLUCOPHAGE) 500 MG tablet Take 500 mg by mouth 2 (two) times daily.  05/18/16   [provider]  Multiple Vitamin (MULTIVITAMIN WITH MINERALS) TABS tablet Take 1 tablet by mouth daily.    [provider]  [facility]IC 7 & 14 & 21 &28 -10 MG C4PK SMARTSIG:Capsule(s) By Mouth As Directed 08/01/19   [provider]  Dola ArgyleNETOUCH DELICA LANCETS 33G MISC CHECK BLOOD SUGAR ONCE  DAILY AS DIRECTED 06/23/15   Corwin Levins, MD  pantoprazole (PROTONIX) 40 MG tablet TAKE 1 TABLET BY MOUTH DAILY 11/16/15   Corwin Levins, MD  Potassium Chloride ER 20 MEQ TBCR Take 20 mEq by mouth 2 (two) times daily.  07/04/16   [provider]  risperiDONE (RISPERDAL) 3 MG tablet Take 3 mg by mouth at bedtime.     [provider]  rosuvastatin (CRESTOR) 40 MG tablet TAKE 1 TABLET(40 MG) BY MOUTH DAILY AT 6 PM 06/02/19   [name], MD  traMADol (ULTRAM) 50 MG tablet Take 1 tablet (50 mg total) by mouth every 6 (six) hours as needed. 08/25/19   Eustace Moore, MD  UREA 20 INTENSIVE HYDRATING 20 % cream Apply 1 application topically daily as needed (dry skin). For dry skin 08/07/16   [provider]    Family History Family History  Problem Relation Age of Onset  . Coronary artery disease Other   . Diabetes Other   . Heart failure Mother   . Heart failure Father   . Colon cancer Neg Hx   . Esophageal cancer Neg Hx   . Stomach cancer Neg Hx   . Rectal cancer Neg Hx     Social History Social History    Tobacco Use  . Smoking status: Never Smoker  . Smokeless tobacco: Never Used  Vaping Use  . Vaping Use: Never used  Substance Use Topics  . Alcohol use: No    Alcohol/week: 0.0 standard drinks  . Drug use: No     Allergies   Penicillins, Pollen extract, Coconut oil, and Mellaril [thioridazine]   Review of Systems Review of Systems See HPI  Physical Exam Triage Vital Signs ED Triage Vitals  Enc Vitals Group     BP 08/25/19 1231 (!) 172/85     Pulse Rate 08/25/19 1231 73     Resp 08/25/19 1231 16     Temp 08/25/19 1231 98.5 F (36.9 C)     Temp Source 08/25/19 1231 Oral     SpO2 08/25/19 1231 94 %     Weight 08/25/19 1230 165 lb 14.4 oz (75.3 kg)     Height --      Head Circumference --      Peak Flow --      Pain Score 08/25/19 1229 7     Pain Loc --      Pain Edu? --      Excl. in GC? --    No data found.  Updated Vital Signs BP (!) 172/85 (BP Location: Left Arm)   Pulse 73   Temp 98.5 F (36.9 C) (Oral)   Resp 16   Wt 75.3 kg   SpO2 94%   BMI 35.28 kg/m      Physical Exam Constitutional:      General: She is not in acute distress.    Appearance: She is well-developed.     Comments: In wheelchair.  Overweight.  HENT:     Head: Normocephalic and atraumatic.     Nose:     Comments: Mask is in place Eyes:     Conjunctiva/sclera: Conjunctivae normal.     Pupils: Pupils are equal, round, and reactive to light.  Cardiovascular:     Rate and Rhythm: Normal rate.  Pulmonary:     Effort: Pulmonary effort is normal. No respiratory distress.  Abdominal:     General: There is no distension.     Palpations: Abdomen is soft.  Musculoskeletal:        General: Normal range of motion.     Cervical back: Normal range of motion.     Comments: Patient has a large bruise, resolving, on her left elbow.  There is also resolving bruises on her left lower leg.  The low back is tender to palpation centrally at the L3-4 region.  No palpable muscle spasm.  No  bruising on the back.  Lower extremity exam is symmetric  Skin:    General: Skin is warm and dry.  Neurological:     General: No focal deficit present.     Mental Status: She is alert.  Psychiatric:        Mood and Affect: Mood normal.        Behavior: Behavior normal.      UC Treatments / Results  Labs (all labs ordered are listed, but only abnormal results are displayed) Labs Reviewed - No data to display  EKG   Radiology DG Lumbar Spine Complete  Result Date: 08/25/2019 CLINICAL DATA:  Low back pain after fall last week. EXAM: LUMBAR SPINE - COMPLETE 4+ VIEW COMPARISON:  November 17, 2016. FINDINGS: No fracture or spondylolisthesis is noted. Moderate degenerative disc disease is noted at L1-2. Mild degenerative disc disease is noted at L4-5. IMPRESSION: Multilevel degenerative disc disease. No acute abnormality seen in the lumbar spine. Electronically Signed   By: Lupita Raider M.D.   On: 08/25/2019 14:07    Procedures Procedures (including critical care time)  Medications Ordered in UC Medications - No data to display  Initial Impression / Assessment and Plan / UC Course  I have reviewed the triage vital signs and the nursing notes.  Pertinent labs & imaging results that were available during my care of the patient were reviewed by me and considered in my medical decision making (see chart for details).     I explained to the patient that nothing was broken.  I recommend rest, pain management, and follow-up with her primary care doctor Final Clinical Impressions(s) / UC Diagnoses   Final diagnoses:  Strain of lumbar region, initial encounter  Fall in home, initial encounter     Discharge Instructions     Take tramadol as needed for pain Follow-up with your primary care doctor   ED Prescriptions    Medication Sig Dispense Auth. Provider   traMADol (ULTRAM) 50 MG tablet Take 1 tablet (50 mg total) by mouth every 6 (six) hours as needed. 15 tablet Eustace Moore, MD     I have reviewed the PDMP during this encounter.   Eustace Moore, MD 08/25/19 [phone]

## 2019-10-13 ENCOUNTER — Other Ambulatory Visit: Payer: Self-pay

## 2019-10-13 ENCOUNTER — Encounter: Payer: Self-pay | Admitting: Physician Assistant

## 2019-10-13 ENCOUNTER — Ambulatory Visit (INDEPENDENT_AMBULATORY_CARE_PROVIDER_SITE_OTHER): Payer: Medicare Other | Admitting: Physician Assistant

## 2019-10-13 VITALS — BP 144/92 | HR 89 | Ht 58.5 in | Wt 140.4 lb

## 2019-10-13 DIAGNOSIS — Z952 Presence of prosthetic heart valve: Secondary | ICD-10-CM

## 2019-10-13 DIAGNOSIS — I10 Essential (primary) hypertension: Secondary | ICD-10-CM

## 2019-10-13 DIAGNOSIS — I2510 Atherosclerotic heart disease of native coronary artery without angina pectoris: Secondary | ICD-10-CM

## 2019-10-13 DIAGNOSIS — E782 Mixed hyperlipidemia: Secondary | ICD-10-CM

## 2019-10-13 DIAGNOSIS — I350 Nonrheumatic aortic (valve) stenosis: Secondary | ICD-10-CM | POA: Diagnosis not present

## 2019-10-13 DIAGNOSIS — R0789 Other chest pain: Secondary | ICD-10-CM | POA: Diagnosis not present

## 2019-10-13 MED ORDER — NITROGLYCERIN 0.4 MG SL SUBL: 0.4000 mg | SUBLINGUAL_TABLET | SUBLINGUAL | 3 refills | Status: DC | PRN

## 2019-10-13 NOTE — Patient Instructions (Signed)
Medication Instructions:  Your physician has recommended you make the following change in your medication:   1) Start Nitroglycerin 0.4 mg, 1 tablet sublingual (under the tongue) as needed for chest pain.   *If you need a refill on your cardiac medications before your next appointment, please call your pharmacy*  Lab Work: Your physician recommends that you return for lab work in 2 weeks on 10/27/19. **The lab is open from 7:30AM-4:30PM** You may come anytime between these hours. Make sure you are fasting so we can check your cholesterol  Testing/Procedures: None ordered today  Follow-Up: At [facility], you and your health needs are our priority.  As part of our continuing mission to provide you with exceptional heart care, we have created designated Provider Care Teams.  These Care Teams include your primary Cardiologist (physician) and Advanced Practice Providers (APPs -  Physician Assistants and Nurse Practitioners) who all work together to provide you with the care you need, when you need it.  Your next appointment:   7 month(s)  The format for your next appointment:   In Person  Provider:   Carlean Jews, PA-C

## 2019-10-13 NOTE — Progress Notes (Signed)
Cardiology Office Note:    Date:  10/13/2019   ID:  Katherine [name], DOB March 15, 1945, MRN [id number]  PCP:  Ronnald Collum  [facility] Cardiologist:  [name], MD  [facility] Electrophysiologist:  None   Referring MD: Coralee Rud, PA-C   Chief Complaint:  Follow-up (CAD, Aortic Valve Disease s/p TAVR)    Patient Profile:    Katherine [name] is a 74 y.o. female with:   Coronary artery disease   S/p DES to LCx x 2, s/p DES to RCA in 03/2019  Aortic stenosis  S/p TAVR in 03/2019  Echocardiogram 3/21: mean 5 mmHg   Echocardiogram 4/21: mean 10 mmHg  Hx of TIA in 12/2018  Remote AFib  Hx of pulmonary embolism   Hypothyroidism   Dementia   Hypertension   Hyperlipidemia   Diabetes mellitus   Prior CV studies: Echocardiogram 05/22/2019 EF 60-65, no RWMA, mild LVH, normal RVSF, mild LAE, trivial MR, s/p TAVR (mean 10 mmHg), no PVL  Echocardiogram 04/23/2019 EF 65-70, GR 1 DD, mild MR, mild to moderate TR, s/p TAVR (mean gradient 5 mmHg), no PVL, RVSP 30.2  Cardiac catheterization 04/15/2019 LAD mid 40 LCx proximal 75, mid 75 RCA proximal 80 PCI: Orbital atherectomy, 3 x 8 mm Resolute Onyx DES to the proximal LCx; 2.5 x 26 mm Resolute Onyx DES to the mid LCx PCI: Orbital atherectomy, 3 x 22 mm Resolute Onyx DES to the proximal RCA  Carotid US 08/2018 Bilateral ICA 1-39  History of Present Illness:    Ms. Goll returns for follow up.  She was last seen by Ronie Spies, PA-C in 07/2019.  She notes that she fell and has had L sided chest pain since.  She attributes it to her fibromyalgia.  She is fairly sedentary.  She does some exercises while sitting.  She has not had chest pain with activity.  She has not had radiating symptoms.  She is short of breath with some activities. She has not had syncope, orthopnea, leg swelling.  She does not smoke.      Past Medical History:  Diagnosis Date  . ANXIETY 07/31/2007  . Atrial fibrillation (HCC)     a. per structural notes, previous hx remote Afib.  [name] CAD (coronary artery disease)    a. 03/2019 orbital atherectomy/PCI with DES to RCA, prox Cx, and mid Cx.  [name] DEPRESSION 09/05/2006  . DIABETES MELLITUS, TYPE II   . Diabetic foot ulcer (HCC) 10/26/2014  . DIVERTICULOSIS, COLON 09/05/2006  . FIBROMYALGIA 05/10/2009  . GERD 09/05/2006  . HYPERLIPIDEMIA 09/05/2006  . HYPERTENSION 09/05/2006  . MRSA infection 10/26/2014  . OSTEOARTHRITIS 09/05/2006  . OSTEOPOROSIS 07/31/2007  . 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 Severe aortic stenosis    s/p TAVR  . Unspecified hearing loss 10/09/2007    Current Medications: Current Meds  Medication Sig  . acetaminophen (TYLENOL) 500 MG tablet Take 500 mg by mouth every 6 (six) hours as needed.  [name] albuterol (VENTOLIN HFA) 108 (90 Base) MCG/ACT inhaler Inhale 1 puff into the lungs 2 (two) times daily as needed for wheezing or shortness of breath.   [name] ascorbic acid (VITAMIN C) 500 MG tablet Take 500 mg by mouth daily.  [name] aspirin EC 81 MG tablet Take 81 mg by mouth daily.   . benztropine (COGENTIN) 0.5 MG tablet Take 0.5  mg by mouth 2 (two) times daily.    . calcium-vitamin D (OSCAL WITH D) 500-200 MG-UNIT tablet Take 1 tablet by mouth 3 (three) times daily.  . cetirizine (ZYRTEC) 10 MG tablet TAKE 1 TABLET BY MOUTH DAILY  . clindamycin (CLEOCIN) 300 MG capsule Take 600 mg (2 capsules) by mouth 60 minutes prior to all dental procedures.  . clopidogrel (PLAVIX) 75 MG tablet Take 1 tablet (75 mg total) by mouth daily.  . diclofenac sodium (VOLTAREN) 1 % GEL Apply 2-4 g topically 4 (four) times daily as needed (Pain).   . fluticasone (FLONASE) 50 MCG/ACT nasal spray Place 1 spray into both nostrils daily as needed for allergies.   [name] gabapentin (NEURONTIN) 100 MG capsule Take 100 mg by mouth 3 (three) times daily.   . hydrOXYzine (ATARAX/VISTARIL) 10 MG tablet Take 1  tablet (10 mg total) by mouth 2 (two) times daily as needed.  [name] ipratropium (ATROVENT) 0.06 % nasal spray USE 1 SPRAY IN EACH NOSTRIL THREE TIMES DAILY  . metFORMIN (GLUCOPHAGE) 500 MG tablet Take 500 mg by mouth 2 (two) times daily.   . Multiple Vitamin (MULTIVITAMIN WITH MINERALS) TABS tablet Take 1 tablet by mouth daily.  [name] NAMZARIC 7 & 14 & 21 &28 -10 MG C4PK SMARTSIG:Capsule(s) By Mouth As Directed  . ONETOUCH DELICA LANCETS 33G MISC CHECK BLOOD SUGAR ONCE DAILY AS DIRECTED  . pantoprazole (PROTONIX) 40 MG tablet TAKE 1 TABLET BY MOUTH DAILY  . Potassium Chloride ER 20 MEQ TBCR Take 20 mEq by mouth 2 (two) times daily.   . risperiDONE (RISPERDAL) 3 MG tablet Take 3 mg by mouth at bedtime.   . rosuvastatin (CRESTOR) 40 MG tablet TAKE 1 TABLET(40 MG) BY MOUTH DAILY AT 6 PM  . UREA 20 INTENSIVE HYDRATING 20 % cream Apply 1 application topically daily as needed (dry skin). For dry skin     Allergies:   Penicillins, Pollen extract, Coconut oil, and Mellaril [thioridazine]   Social History   Tobacco Use  . Smoking status: Never Smoker  . Smokeless tobacco: Never Used  Vaping Use  . Vaping Use: Never used  Substance Use Topics  . Alcohol use: No    Alcohol/week: 0.0 standard drinks  . Drug use: No     Family Hx: [name] family history includes Coronary artery disease in an other family member; Diabetes in an other family member; Heart failure in her father and mother. There is no history of Colon cancer, Esophageal cancer, Stomach cancer, or Rectal cancer.  ROS   EKGs/Labs/Other Test Reviewed:    EKG:  EKG is  ordered today.  The ekg ordered today demonstrates normal sinus rhythm, heart rate 83, left axis deviation, nonspecific ST-T wave changes, QTC 434, no change from prior tracing  Recent Labs: 04/18/2019: ALT 30; B Natriuretic Peptide 97.7 04/23/2019: Magnesium 2.0 04/24/2019: BUN 17; Creatinine, Ser 0.53; Hemoglobin 9.0; Platelets 249; Potassium 4.2; Sodium 138    Recent Lipid Panel Lab Results  Component Value Date/Time   CHOL 140 06/15/2015 01:49 PM   TRIG 148.0 06/15/2015 01:49 PM   TRIG 104 01/24/2006 09:56 AM   HDL 39.00 (L) 06/15/2015 01:49 PM   CHOLHDL 4 06/15/2015 01:49 PM   LDLCALC 72 06/15/2015 01:49 PM   LDLDIRECT 66.0 06/16/2014 02:37 PM    Physical Exam:    VS:  BP (!) 144/92   Pulse 89   Ht 4' 10.5" (1.486 m)   Wt 140 lb 6.4 oz (63.7 kg)   SpO2  97%   BMI 28.84 kg/m     Wt Readings from Last 3 Encounters:  10/13/19 140 lb 6.4 oz (63.7 kg)  08/25/19 165 lb 14.4 oz (75.3 kg)  08/13/19 143 lb 8.3 oz (65.1 kg)     Constitutional:      Appearance: Healthy appearance. Not in distress.  Pulmonary:     Effort: Pulmonary effort is normal.     Breath sounds: No wheezing. No rales.  Cardiovascular:     Normal rate. Regular rhythm. Normal S1. Normal S2.     Murmurs: There is a grade 1/6 early systolic murmur at the URSB.  Edema:    Peripheral edema absent.  Abdominal:     Palpations: Abdomen is soft.  Musculoskeletal:     Cervical back: Neck supple. Skin:    General: Skin is warm and dry.  Neurological:     Mental Status: Alert and oriented to person, place and time.     Cranial Nerves: Cranial nerves are intact.       ASSESSMENT & PLAN:    1. Other chest pain She has had some chest discomfort since she fell some months ago.  Her symptoms are atypical for ischemia.  Her ECG is unchanged.  I have asked her to contact us if she has worsening chest symptoms.  At that point, I suspect she will need a nuclear stress test.  She has been given a prescription for nitroglycerin to use as needed.  2. Coronary artery disease involving native coronary artery of native heart without angina pectoris Status post DES x2 to the LCx and DES x1 to the RCA March 2021.  Continue aspirin, clopidogrel, rosuvastatin.  It has been recommended that she stop taking clopidogrel at the end of September.  She has a full bottle.  I told her that  if it is easier for her just to finish the bottle, she can certainly do that instead of stopping it at the end of September.  As noted, she has had some chest discomfort but it does not sound as though she is having anginal symptoms.  If she has more chest discomfort or worsening symptoms, she will need nuclear stress testing.  3. Nonrheumatic aortic valve stenosis 4. S/P TAVR (transcatheter aortic valve replacement) Her most recent echocardiogram demonstrated well-functioning aortic valve prosthesis.  Continue SBE prophylaxis.  She will have a follow-up with the structural heart clinic again in April 2022 with a repeat echocardiogram.  5. Essential hypertension Blood pressure elevated today.  She notes a salty meal yesterday.  Her pressure is usually well controlled.  Continue to monitor for now.  6. Mixed hyperlipidemia Continue high intensity statin therapy.  Arrange fasting CMET, lipids.    Dispo:  Return in about 7 months (around 05/12/2020) for Routine Follow Up w/ Carlean Jews, PA-C, in person.   Medication Adjustments/Labs and Tests Ordered: Current medicines are reviewed at length with the patient today.  Concerns regarding medicines are outlined above.  Tests Ordered: Orders Placed This Encounter  Procedures  . Comprehensive metabolic panel  . Lipid panel  . EKG 12-Lead   Medication Changes: Meds ordered this encounter  Medications  . nitroGLYCERIN (NITROSTAT) 0.4 MG SL tablet    Sig: Place 1 tablet (0.4 mg total) under the tongue every 5 (five) minutes as needed for chest pain.    Dispense:  25 tablet    Refill:  3    Signed, Tereso Newcomer, PA-C  10/13/2019 4:50 PM    [facility] [place], [place], [place]  [address] Phone: [phone]; Fax: [phone]

## 2019-10-27 ENCOUNTER — Other Ambulatory Visit: Payer: Medicare Other | Admitting: *Deleted

## 2019-10-27 ENCOUNTER — Other Ambulatory Visit: Payer: Self-pay

## 2019-10-27 DIAGNOSIS — I2510 Atherosclerotic heart disease of native coronary artery without angina pectoris: Secondary | ICD-10-CM

## 2019-10-27 DIAGNOSIS — I10 Essential (primary) hypertension: Secondary | ICD-10-CM

## 2019-10-27 DIAGNOSIS — E782 Mixed hyperlipidemia: Secondary | ICD-10-CM

## 2019-10-28 LAB — COMPREHENSIVE METABOLIC PANEL
ALT: 25 IU/L (ref 0–32)
AST: 27 IU/L (ref 0–40)
Albumin/Globulin Ratio: 2.3 — ABNORMAL HIGH (ref 1.2–2.2)
Albumin: 5 g/dL — ABNORMAL HIGH (ref 3.7–4.7)
Alkaline Phosphatase: 68 IU/L (ref 44–121)
BUN/Creatinine Ratio: 24 (ref 12–28)
BUN: 10 mg/dL (ref 8–27)
Bilirubin Total: 0.5 mg/dL (ref 0.0–1.2)
CO2: 23 mmol/L (ref 20–29)
Calcium: 9.7 mg/dL (ref 8.7–10.3)
Chloride: 97 mmol/L (ref 96–106)
Creatinine, Ser: 0.42 mg/dL — ABNORMAL LOW (ref 0.57–1.00)
GFR calc Af Amer: 117 mL/min/{1.73_m2} (ref >59)
GFR calc non Af Amer: 101 mL/min/{1.73_m2} (ref >59)
Globulin, Total: 2.2 g/dL (ref 1.5–4.5)
Glucose: 95 mg/dL (ref 65–99)
Potassium: 4.2 mmol/L (ref 3.5–5.2)
Sodium: 138 mmol/L (ref 134–144)
Total Protein: 7.2 g/dL (ref 6.0–8.5)

## 2019-10-28 LAB — LIPID PANEL
Chol/HDL Ratio: 2.4 ratio (ref 0.0–4.4)
Cholesterol, Total: 122 mg/dL (ref 100–199)
HDL: 50 mg/dL (ref >39)
LDL Chol Calc (NIH): 52 mg/dL (ref 0–99)
Triglycerides: 113 mg/dL (ref 0–149)
VLDL Cholesterol Cal: 20 mg/dL (ref 5–40)

## 2019-11-12 ENCOUNTER — Encounter: Payer: Self-pay | Admitting: Orthopaedic Surgery

## 2019-11-12 ENCOUNTER — Other Ambulatory Visit: Payer: Self-pay

## 2019-11-12 ENCOUNTER — Ambulatory Visit: Payer: Self-pay

## 2019-11-12 ENCOUNTER — Ambulatory Visit (INDEPENDENT_AMBULATORY_CARE_PROVIDER_SITE_OTHER): Payer: Medicare Other | Admitting: Orthopaedic Surgery

## 2019-11-12 DIAGNOSIS — G8929 Other chronic pain: Secondary | ICD-10-CM | POA: Diagnosis not present

## 2019-11-12 DIAGNOSIS — M25512 Pain in left shoulder: Secondary | ICD-10-CM

## 2019-11-12 DIAGNOSIS — M12812 Other specific arthropathies, not elsewhere classified, left shoulder: Secondary | ICD-10-CM | POA: Diagnosis not present

## 2019-11-12 NOTE — Progress Notes (Signed)
Office Visit Note   Patient: Katherine [name]           Date of Birth: 09-Sep-1945           MRN: [id number] Visit Date: 11/12/2019              Requested by: Ronnald Collum [facility]  [address] [place],  [place] [address] PCP: Coralee Rud, PA-C   Assessment & Plan: Visit Diagnoses:  1. Chronic left shoulder pain   2. Rotator cuff arthropathy of left shoulder     Plan: I would like to send her to Dr. Ave Filter with [facility] again for her left shoulder to consider an arthroplasty for her left shoulder.  She has had such a good outcome with the right shoulder and she did well with him that she agrees with this referral as well.  Follow-Up Instructions: Return if symptoms worsen or fail to improve.   Orders:  Orders Placed This Encounter  Procedures  . XR Shoulder Left   No orders of the defined types were placed in this encounter.     Procedures: No procedures performed   Clinical Data: No additional findings.   Subjective: Chief Complaint  Patient presents with  . Left Shoulder - Pain  The patient is a former patient of mine who comes in with significant left shoulder pain that is chronic and slowly getting worse.  It hurts with overhead activities and she has weakness in that shoulder.  I actually sent her to Dr. Jackquline Bosch of [facility] a few years ago and he replaced her right shoulder with a reverse total shoulder arthroplasty.  She is very pleased with the right shoulder and did like her interaction with Dr. Ave Filter.  Since I have seen her, she has had heart surgery.  She is on Plavix now.  She ambulates using a cane.  She reports significant pain with overhead activities.  She is a diabetic but reports good control of her blood glucose levels. HPI  Review of Systems Today she denies any headache, chest pain, shortness of breath, fever, chills, nausea, vomiting  Objective: Vital Signs: There were no vitals  taken for this visit.  Physical Exam He is alert and orient x3 and in no acute distress Ortho Exam Examination of the left shoulder shows significant weakness of that shoulder.  There is grinding at the glenohumeral joint but also the humeral head seems to articulate with the undersurface of the acromion. Specialty Comments:  No specialty comments available.  Imaging: XR Shoulder Left  Result Date: 11/12/2019 3 views left shoulder show significant rotator cuff arthropathy.    PMFS History: Patient Active Problem List   Diagnosis Date Noted  . Rotator cuff arthropathy of left shoulder 11/12/2019  . 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 Type 2 diabetes mellitus with complication (HCC)   . Coronary artery disease involving native coronary artery of native heart with angina pectoris (HCC)   . 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 MRSA infection 10/26/2014  . Diabetic foot ulcer (HCC) 10/26/2014  . Pre-ulcerative corn or callous 10/09/2013  . Altered mental status 04/29/2013  . Normocytic anemia 01/06/2013  . 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 Thyroid nodule 11/07/2012  . Severe aortic stenosis 05/27/2012  . FRACTURE, PELVIS, RIGHT 04/15/2010  . Fibromyalgia 05/10/2009  . Anxiety state 07/31/2007  . Osteoporosis 07/31/2007  . Hyperlipidemia 09/05/2006  . DEPRESSION 09/05/2006  .  Essential hypertension 09/05/2006  . Allergic rhinitis 09/05/2006  . GERD 09/05/2006  . DIVERTICULOSIS, COLON 09/05/2006  . OSTEOARTHRITIS 09/05/2006   Past Medical History:  Diagnosis Date  . ANXIETY 07/31/2007  . Atrial fibrillation (HCC)    a. per structural notes, previous hx remote Afib.  [name] CAD (coronary artery disease)    a. 03/2019 orbital atherectomy/PCI with DES to RCA, prox Cx, and mid Cx.  [name] DEPRESSION 09/05/2006  . DIABETES MELLITUS, TYPE II   . Diabetic foot ulcer (HCC) 10/26/2014  . DIVERTICULOSIS, COLON 09/05/2006  .  FIBROMYALGIA 05/10/2009  . GERD 09/05/2006  . HYPERLIPIDEMIA 09/05/2006  . HYPERTENSION 09/05/2006  . MRSA infection 10/26/2014  . OSTEOARTHRITIS 09/05/2006  . OSTEOPOROSIS 07/31/2007  . 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 Severe aortic stenosis    s/p TAVR  . Unspecified hearing loss 10/09/2007    Family History  Problem Relation Age of Onset  . Coronary artery disease Other   . Diabetes Other   . Heart failure Mother   . Heart failure Father   . Colon cancer Neg Hx   . Esophageal cancer Neg Hx   . Stomach cancer Neg Hx   . Rectal cancer Neg Hx     Past Surgical History:  Procedure Laterality Date  . ABDOMINAL HYSTERECTOMY  12/08   Bladder tac, partial hysterectomy  . BREAST BIOPSY    . CARDIAC CATHETERIZATION    . catarct removal     both eyes  . COLONOSCOPY    . CORONARY ATHERECTOMY N/A 04/15/2019   Procedure: CORONARY ATHERECTOMY;  Surgeon: [name], MD;  Location: [facility] INVASIVE CV LAB;  Service: Cardiovascular;  Laterality: N/A;  . CORONARY STENT INTERVENTION N/A 04/15/2019   Procedure: CORONARY STENT INTERVENTION;  Surgeon: [name], MD;  Location: [facility] INVASIVE CV LAB;  Service: Cardiovascular;  Laterality: N/A;  . I & D KNEE WITH POLY EXCHANGE Left 01/03/2013   Procedure: IRRIGATION AND DEBRIDEMENT LEFT KNEE WITH POLY EXCHANGE;  Surgeon: Kathryne Hitch, MD;  Location: [facility] ORS;  Service: Orthopedics;  Laterality: Left;  . inguinal herniorrhapy     right Dr. Freida Busman 06/2009  . REVERSE SHOULDER ARTHROPLASTY Right 09/07/2016  . REVERSE SHOULDER ARTHROPLASTY Right 09/07/2016   Procedure: REVERSE SHOULDER ARTHROPLASTY;  Surgeon: Jones Broom, MD;  Location: [facility] OR;  Service: Orthopedics;  Laterality: Right;  Right reverse shoulder arthroplasty  . RIGHT/LEFT HEART CATH AND CORONARY ANGIOGRAPHY N/A 09/11/2018   Procedure: RIGHT/LEFT HEART CATH AND CORONARY ANGIOGRAPHY;   Surgeon: [name], MD;  Location: [facility] INVASIVE CV LAB;  Service: Cardiovascular;  Laterality: N/A;  . TEE WITHOUT CARDIOVERSION N/A 04/22/2019   Procedure: TRANSESOPHAGEAL ECHOCARDIOGRAM (TEE);  Surgeon: [name], MD;  Location: [facility] INVASIVE CV LAB;  Service: Open Heart Surgery;  Laterality: N/A;  . TONSILLECTOMY    . TOTAL KNEE ARTHROPLASTY Left 12/20/2012   Procedure: LEFT TOTAL KNEE ARTHROPLASTY;  Surgeon: Kathryne Hitch, MD;  Location: [facility] ORS;  Service: Orthopedics;  Laterality: Left;  . TRANSCATHETER AORTIC VALVE REPLACEMENT, TRANSFEMORAL N/A 04/22/2019   Procedure: TRANSCATHETER AORTIC VALVE REPLACEMENT, TRANSFEMORAL;  Surgeon: [name], MD;  Location: [facility] INVASIVE CV LAB;  Service: Open Heart Surgery;  Laterality: N/A;   Social History   Occupational History  . Occupation: retired [employer]    Employer: DISABLED  Tobacco Use  . Smoking status: Never Smoker  . Smokeless  tobacco: Never Used  Vaping Use  . Vaping Use: Never used  Substance and Sexual Activity  . Alcohol use: No    Alcohol/week: 0.0 standard drinks  . Drug use: No  . Sexual activity: Not Currently    Birth control/protection: Surgical

## 2020-01-09 ENCOUNTER — Other Ambulatory Visit: Payer: Self-pay

## 2020-01-09 ENCOUNTER — Ambulatory Visit
Admission: RE | Admit: 2020-01-09 | Discharge: 2020-01-09 | Disposition: A | Discharge status: Home / Self Care | Payer: Medicare Other | Source: Ambulatory Visit | Attending: Cardiology | Admitting: Cardiology

## 2020-01-09 DIAGNOSIS — Z1231 Encounter for screening mammogram for malignant neoplasm of breast: Secondary | ICD-10-CM

## 2020-02-06 ENCOUNTER — Other Ambulatory Visit: Payer: Self-pay | Admitting: Physician Assistant

## 2020-02-19 ENCOUNTER — Other Ambulatory Visit: Payer: Self-pay | Admitting: Physician Assistant

## 2020-04-09 ENCOUNTER — Ambulatory Visit (INDEPENDENT_AMBULATORY_CARE_PROVIDER_SITE_OTHER): Payer: Medicare Other | Admitting: Podiatry

## 2020-04-09 ENCOUNTER — Other Ambulatory Visit: Payer: Self-pay

## 2020-04-09 DIAGNOSIS — B351 Tinea unguium: Secondary | ICD-10-CM | POA: Diagnosis not present

## 2020-04-09 DIAGNOSIS — E118 Type 2 diabetes mellitus with unspecified complications: Secondary | ICD-10-CM | POA: Diagnosis not present

## 2020-04-09 DIAGNOSIS — M79674 Pain in right toe(s): Secondary | ICD-10-CM

## 2020-04-09 DIAGNOSIS — M79675 Pain in left toe(s): Secondary | ICD-10-CM

## 2020-04-12 ENCOUNTER — Encounter: Payer: Self-pay | Admitting: Podiatry

## 2020-04-12 ENCOUNTER — Telehealth: Payer: Self-pay | Admitting: Podiatry

## 2020-04-12 NOTE — Telephone Encounter (Signed)
Called patient to reschedule 5/20 appt she wanted me to call her caregiver [name] [phone]. Left voicemail and also sent a letter.

## 2020-04-13 ENCOUNTER — Encounter: Payer: Self-pay | Admitting: Podiatry

## 2020-04-13 NOTE — Progress Notes (Signed)
  Subjective:  Patient ID: Katherine [name], female    DOB: 1945-07-05,  MRN: [id number]  Chief Complaint  Patient presents with  . Nail Problem    Foot exam and nail trim    75 y.o. female returns for the above complaint.  Patient presents with complaint of thickened elongated dystrophic toenails x10.  Pain on palpation.  She states that she is not able to bear down herself.  She would like for me to do it.  She denies any other acute complaints.She is a diabetic with last A1c of 5.7  Objective:  There were no vitals filed for this visit. Podiatric Exam: Vascular: dorsalis pedis and posterior tibial pulses are palpable bilateral. Capillary return is immediate. Temperature gradient is WNL. Skin turgor WNL  Sensorium: Normal Semmes Weinstein monofilament test. Normal tactile sensation bilaterally. Nail Exam: Pt has thick disfigured discolored nails with subungual debris noted bilateral entire nail hallux through fifth toenails.  Pain on palpation to the nails. Ulcer Exam: There is no evidence of ulcer or pre-ulcerative changes or infection. Orthopedic Exam: Muscle tone and strength are WNL. No limitations in general ROM. No crepitus or effusions noted. HAV  B/L.  Hammer toes 2-5  B/L. Skin: No Porokeratosis. No infection or ulcers    Assessment & Plan:  No diagnosis found.  Patient was evaluated and treated and all questions answered.  Onychomycosis with pain  -Nails palliatively debrided as below. -Educated on self-care  Procedure: Nail Debridement Rationale: pain  Type of Debridement: manual, sharp debridement. Instrumentation: Nail nipper, rotary burr. Number of Nails: 10  Procedures and Treatment: Consent by patient was obtained for treatment procedures. The patient understood the discussion of treatment and procedures well. All questions were answered thoroughly reviewed. Debridement of mycotic and hypertrophic toenails, 1 through 5 bilateral and clearing of subungual debris.  No ulceration, no infection noted.  Return Visit-Office Procedure: Patient instructed to return to the office for a follow up visit 3 months for continued evaluation and treatment.  Nicholes Rough, DPM    No follow-ups on file.

## 2020-04-20 NOTE — Progress Notes (Addendum)
[facility]   MULTIDISCIPLINARY HEART VALVE CLINIC                                       Cardiology Office Note    Date:  04/21/2020   ID:  Katherine [name], DOB 04-09-1945, MRN [id number]  PCP:  Coralee Rud, PA-C  Cardiologist: Arnette Felts PA-C / Dr. Excell Seltzer & Dr. Cornelius Moras (TAVR)  CC: 1 year s/p TAVR  History of Present Illness:  Katherine [name] is a 75 y.o. female with a history of CAD, GERD, TIA (12/2018), remote atrial fibrillation with no documented recurrence (not on OAC),history ofPE, hypothyroidism, and severe aortic stenosiss/p TAVR (04/22/19) who presents to clinic for follow up.   Patient states she has known of presence of a heart murmur for many years. She has been followed by Roxanne Mins for many years with multiple medical problems. 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 Echocardiograms have documented the presence of aortic stenosis that has gradually progressed in severity. She was originally referred to the multidisciplinary heart valve clinic in summer of 2020 at which time she was evaluated by Dr. Excell Seltzer. Echocardiogram performed at that time revealed moderate aortic stenosis with normal left ventricular systolic function. Diagnostic cardiac catheterization revealed multivessel coronary artery disease with long segment high-grade stenosis of the right coronary artery and 75% stenosis in the left circumflex. There was nonobstructive disease in the left anterior descending coronary artery. Mean transvalvular gradient across the aortic valve was measured 25 mmHg at that time, corresponding to aortic valve area calculated 1.22 cm. Medical therapy was recommendedat the time. Follow up echo revealed progression of aortic stenosis and she was referred to Dr. Cornelius Moras who felt CAD required revascularization. She underwent successful two-vessel orbital atherectomy and PCI using a 3.0 x 20 mm resolute Onyx DES in the RCA, 3.0 x 8 mm  resolute Onyx DES in the proximal circumflex, and 2.5 x 26 mm resolute Onyx DES in the mid circumflexon 04/15/19.    The patient was evaluated by the multidisciplinary valve team and underwent successful TAVR with a36mm Edwards Sapien 3 THV via the TF approach on 04/22/19. Post operative echoshowed EF 65-70%, normally functioning TAVR with a mean gradient of 5 mm Hg and no PVL. She was discharged on home on continued aspirin and plavix. 1 month echo showed EF 60%, normally functioning TAVR with a mean gradient of 10 mm hg and no PVL.  Today she presents to clinic for follow up. No CP. Sometimes get SOB doing moderate housework. No LE edema, orthopnea or PND. No dizziness or syncope. No blood in stool or urine. No palpitations.    Past Medical History:  Diagnosis Date  . ANXIETY 07/31/2007  . Atrial fibrillation (HCC)    a. per structural notes, previous hx remote Afib.  [name] CAD (coronary artery disease)    a. 03/2019 orbital atherectomy/PCI with DES to RCA, prox Cx, and mid Cx.  [name] DEPRESSION 09/05/2006  . DIABETES MELLITUS, TYPE II   . Diabetic foot ulcer (HCC) 10/26/2014  . DIVERTICULOSIS, COLON 09/05/2006  . FIBROMYALGIA 05/10/2009  . GERD 09/05/2006  . HYPERLIPIDEMIA 09/05/2006  . HYPERTENSION 09/05/2006  . MRSA infection 10/26/2014  . OSTEOARTHRITIS 09/05/2006  . OSTEOPOROSIS 07/31/2007  . 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 Severe aortic stenosis    s/p TAVR  . Unspecified hearing loss 10/09/2007    Past Surgical History:  Procedure Laterality Date  . ABDOMINAL HYSTERECTOMY  12/08   Bladder tac, partial hysterectomy  . BREAST BIOPSY    . CARDIAC CATHETERIZATION    . catarct removal     both eyes  . COLONOSCOPY    . CORONARY ATHERECTOMY N/A 04/15/2019   Procedure: CORONARY ATHERECTOMY;  Surgeon: Tonny Bollmanooper, Michael, MD;  [facility];  Service: Cardiovascular;  Laterality:  N/A;  . CORONARY STENT INTERVENTION N/A 04/15/2019   Procedure: CORONARY STENT INTERVENTION;  Surgeon: Tonny Bollmanooper, Michael, MD;  [facility];  Service: Cardiovascular;  Laterality: N/A;  . I & D KNEE WITH POLY EXCHANGE Left 01/03/2013   Procedure: IRRIGATION AND DEBRIDEMENT LEFT KNEE WITH POLY EXCHANGE;  Surgeon: Kathryne Hitchhristopher Y Blackman, MD;  Location: [facility] ORS;  Service: Orthopedics;  Laterality: Left;  . inguinal herniorrhapy     right Dr. Freida BusmanAllen 06/2009  . REVERSE SHOULDER ARTHROPLASTY Right 09/07/2016  . REVERSE SHOULDER ARTHROPLASTY Right 09/07/2016   Procedure: REVERSE SHOULDER ARTHROPLASTY;  Surgeon: Jones Broomhandler, Justin, MD;  [facility];  Service: Orthopedics;  Laterality: Right;  Right reverse shoulder arthroplasty  . RIGHT/LEFT HEART CATH AND CORONARY ANGIOGRAPHY N/A 09/11/2018   Procedure: RIGHT/LEFT HEART CATH AND CORONARY ANGIOGRAPHY;  Surgeon: Tonny Bollmanooper, Michael, MD;  [facility];  Service: Cardiovascular;  Laterality: N/A;  . TEE WITHOUT CARDIOVERSION N/A 04/22/2019   Procedure: TRANSESOPHAGEAL ECHOCARDIOGRAM (TEE);  Surgeon: Tonny Bollmanooper, Michael, MD;  [facility];  Service: Open Heart Surgery;  Laterality: N/A;  . TONSILLECTOMY    . TOTAL KNEE ARTHROPLASTY Left 12/20/2012   Procedure: LEFT TOTAL KNEE ARTHROPLASTY;  Surgeon: Kathryne Hitchhristopher Y Blackman, MD;  Location: [facility];  Service: Orthopedics;  Laterality: Left;  . TRANSCATHETER AORTIC VALVE REPLACEMENT, TRANSFEMORAL N/A 04/22/2019   Procedure: TRANSCATHETER AORTIC VALVE REPLACEMENT, TRANSFEMORAL;  Surgeon: Tonny Bollmanooper, Michael, MD;  [facility];  Service: Open Heart Surgery;  Laterality: N/A;    Current Medications: Outpatient Medications Prior to Visit  Medication Sig Dispense Refill  . acetaminophen (TYLENOL) 500 MG tablet Take 500 mg by mouth every 6 (six) hours as needed.    [name]. albuterol (VENTOLIN HFA) 108 (90 Base) MCG/ACT inhaler Inhale 1 puff into the lungs 2 (two) times daily as  needed for wheezing or shortness of breath.     [name]. amLODipine (NORVASC) 10 MG tablet Take 10 mg by mouth daily.    [name]. ascorbic acid (VITAMIN C) 500 MG tablet Take 500 mg by mouth daily.    [name]. aspirin EC 81 MG tablet Take 81 mg by mouth daily.     . benztropine (COGENTIN) 0.5 MG tablet Take 0.5 mg by mouth 2 (two) times daily.    . beta carotene [id number] UNIT capsule Take 25,000 Units by mouth daily.    . busPIRone (BUSPAR) 10 MG tablet Take 10 mg by mouth 3 (three) times daily.    . calcium-vitamin D (OSCAL WITH D) 500-200 MG-UNIT tablet Take 1 tablet by mouth 3 (three) times daily. 270 tablet 3  . cetirizine (ZYRTEC) 10 MG tablet TAKE 1 TABLET BY MOUTH DAILY 30 tablet 0  . diclofenac sodium (VOLTAREN) 1 % GEL Apply 2-4 g topically 4 (four) times daily as needed (Pain).     . fluticasone (FLONASE) 50 MCG/ACT nasal spray Place 1 spray into both nostrils daily as  needed for allergies.     [name] gabapentin (NEURONTIN) 100 MG capsule Take 100 mg by mouth 3 (three) times daily.     . hydrOXYzine (ATARAX/VISTARIL) 10 MG tablet Take 1 tablet (10 mg total) by mouth 2 (two) times daily as needed. 30 tablet 11  . ipratropium (ATROVENT) 0.06 % nasal spray USE 1 SPRAY IN EACH NOSTRIL THREE TIMES DAILY 30 mL 11  . metFORMIN (GLUCOPHAGE) 500 MG tablet Take 500 mg by mouth 2 (two) times daily.     . Multiple Vitamin (MULTIVITAMIN WITH MINERALS) TABS tablet Take 1 tablet by mouth daily.    [name] NAMZARIC 7 & 14 & 21 &28 -10 MG C4PK Per patient taking one capsule daily    . nitroGLYCERIN (NITROSTAT) 0.4 MG SL tablet PLACE 1 TABLET UNDER THE TONGUE EVERY 5 MINUTES AS NEEDED FOR CHEST PAIN 25 tablet 6  . Omega-3 1000 MG CAPS Take by mouth daily.    Letta Pate DELICA LANCETS 33G MISC CHECK BLOOD SUGAR ONCE DAILY AS DIRECTED 100 each 0  . pantoprazole (PROTONIX) 40 MG tablet TAKE 1 TABLET BY MOUTH DAILY 90 tablet 0  . Potassium Chloride ER 20 MEQ TBCR Take 20 mEq by mouth 2 (two) times daily.   1  . pyridOXINE (VITAMIN B-6) 100  MG tablet Take 100 mg by mouth daily.    . risperiDONE (RISPERDAL) 3 MG tablet Take 3 mg by mouth at bedtime.    . rosuvastatin (CRESTOR) 40 MG tablet TAKE 1 TABLET(40 MG) BY MOUTH DAILY AT 6 PM 90 tablet 3  . UREA 20 INTENSIVE HYDRATING 20 % cream Apply 1 application topically daily as needed (dry skin). For dry skin  6  . clindamycin (CLEOCIN) 300 MG capsule Take 600 mg (2 capsules) by mouth 60 minutes prior to all dental procedures. 4 capsule 3  . clopidogrel (PLAVIX) 75 MG tablet Take 1 tablet (75 mg total) by mouth daily. 90 tablet 3  . diclofenac Sodium (VOLTAREN) 1 % GEL SMARTSIG:Gram(s) Topical As Directed (Patient not taking: Reported on 04/21/2020)     No facility-administered medications prior to visit.     Allergies:   Penicillins, Pollen extract, Coconut oil, and Mellaril [thioridazine]   Social History   Socioeconomic History  . Marital status: Single    Spouse name: Not on file  . Number of children: 0  . Years of education: 9  . Highest education level: High school graduate  Occupational History  . Occupation: retired [employer]    Employer: DISABLED  Tobacco Use  . Smoking status: Never Smoker  . Smokeless tobacco: Never Used  Vaping Use  . Vaping Use: Never used  Substance and Sexual Activity  . Alcohol use: No    Alcohol/week: 0.0 standard drinks  . Drug use: No  . Sexual activity: Not Currently    Birth control/protection: Surgical  Other Topics Concern  . Not on file  Social History Narrative   LIves alone with home health services.   Social Determinants of Health   Financial Resource Strain: Not on file  Food Insecurity: Not on file  Transportation Needs: Not on file  Physical Activity: Not on file  Stress: Not on file  Social Connections: Not on file     Family History:  [name] family history includes Coronary artery disease in an other family member; Diabetes in an other family member; Heart failure in her father and mother.      ROS:   Please see the history  of present illness.    ROS All other systems reviewed and are negative.   PHYSICAL EXAM:   VS:  BP 140/86   Pulse 74   Ht 4' 10.5" (1.486 m)   Wt 132 lb (59.9 kg)   SpO2 97%   BMI 27.12 kg/m    GEN: Well nourished, well developed, in no acute distress HEENT: normal Neck: no JVD or masses Cardiac: RRR; soft flow murmur. No rubs, or gallops,no edema  Respiratory:  clear to auscultation bilaterally, normal work of breathing GI: soft, nontender, nondistended, + BS MS: no deformity or atrophy Skin: warm and dry, no rash Neuro:  Alert and Oriented x 3, Strength and sensation are intact Psych: euthymic mood, full affect   Wt Readings from Last 3 Encounters:  04/21/20 132 lb (59.9 kg)  10/13/19 140 lb 6.4 oz (63.7 kg)  08/25/19 165 lb 14.4 oz (75.3 kg)      Studies/Labs Reviewed:   EKG:  EKG is NOT ordered today.   Recent Labs: 04/23/2019: Magnesium 2.0 04/24/2019: Hemoglobin 9.0; Platelets 249 10/27/2019: ALT 25; BUN 10; Creatinine, Ser 0.42; Potassium 4.2; Sodium 138   Lipid Panel    Component Value Date/Time   CHOL 122 10/27/2019 1402   TRIG 113 10/27/2019 1402   TRIG 104 01/24/2006 0956   HDL 50 10/27/2019 1402   CHOLHDL 2.4 10/27/2019 1402   CHOLHDL 4 06/15/2015 1349   VLDL 29.6 06/15/2015 1349   LDLCALC 52 10/27/2019 1402   LDLDIRECT 66.0 06/16/2014 1437    Additional studies/ records that were reviewed today include:  TAVR OPERATIVE NOTE   Date of Procedure:04/22/2019  Preoperative Diagnosis:Severe Aortic Stenosis   Postoperative Diagnosis:Same   Procedure:   Transcatheter Aortic Valve Replacement - Percutaneous Transfemoral Approach Edwards Sapien 3 UltraTHV (size2mm, model # [id number], serial [id number])  Co-Surgeons:Clarence H. Cornelius Moras, MD and [name], MD  Anesthesiologist:David Noreene Larsson,  MD  Delano Metz, MD  Pre-operative Echo Findings: ? Severe aortic stenosis ? Normalleft ventricular systolic function  Post-operative Echo Findings: ? Noparavalvular leak ? Normal/unchangedleft ventricular systolic function  _________________   Echo3/24/21: IMPRESSIONS  1. Day 1 post TAVR, normal transaortic gradients with peak/mean gradients 9/5 mHg, no paravalvular leak.  2. Left ventricular ejection fraction, by estimation, is 65 to 70%. The  left ventricle has hyperdynamic function. Left ventricular diastolic  parameters are consistent with Grade I diastolic dysfunction (impaired  relaxation). Elevated left ventricular  end-diastolic pressure.  3. Mild mitral valve regurgitation.  4. Tricuspid valve regurgitation is mild to moderate.  5. The aortic valve has been repaired/replaced. There is a 26 mm Edwards Sapien prosthetic (TAVR) valve present in the aortic position. Procedure  Date: 04/23/2019. Aortic valve mean gradient measures 5.0 mmHg.  6. There is mildly elevated pulmonary artery systolic pressure. The  estimated right ventricular systolic pressure is 30.2 mmHg.   _____________________  Echo 05/22/19 IMPRESSIONS  1. Left ventricular ejection fraction, by estimation, is 60 to 65%. The left ventricle has normal function. The left ventricle has no regional wall motion abnormalities. There is mild left ventricular hypertrophy. Left ventricular diastolic parameters  were normal.  2. Right ventricular systolic function is normal. The right ventricular size is normal. There is normal pulmonary artery systolic pressure.  3. Left atrial size was mildly dilated.  4. The mitral valve is normal in structure. Trivial mitral valve regurgitation. No evidence of mitral stenosis.  5. Well seated 26 mm Sapien 3 Ultra valve with no PVL. Gradients have increased since 04/23/19 day one  post implant when mean gradient was 5 mmHg and peak  9 mmHg . The aortic valve has been repaired/replaced. Aortic valve regurgitation is not visualized.  No aortic stenosis is present. There is a 26 mm Edwards Sapien prosthetic (TAVR) valve present in the aortic position. Procedure Date: 04/22/19.  6. The inferior vena cava is normal in size with greater than 50% respiratory variability, suggesting right atrial pressure of 3 mmHg.  _______________________   Echo 04/21/2020 IMPRESSIONS  1. Left ventricular ejection fraction, by estimation, is 60 to 65%. The  left ventricle has normal function. The left ventricle has no regional  wall motion abnormalities. Left ventricular diastolic parameters are  consistent with Grade I diastolic dysfunction (impaired relaxation).  2. Right ventricular systolic function is normal. The right ventricular  size is normal.  3. The mitral valve is grossly normal. Trivial mitral valve  regurgitation.  4. The aortic valve has been repaired/replaced. Aortic valve  regurgitation is not visualized. There is a 26 mm Edwards Sapien  prosthetic (TAVR) valve present in the aortic position. Procedure Date: 04/22/2019. Echo findings are consistent with normal  structure and function of the aortic valve prosthesis. Aortic valve area,  by VTI measures 1.56 cm. Aortic valve mean gradient measures 6.0 mmHg.  Aortic valve Vmax measures 1.59 m/s.   Comparison(s): Changes from prior study are noted. 05/22/2019: LVEF 60-65%, 26 mm Sapien 3 THV, no PVL, mean gradient 10 mmHg.   ASSESSMENT & PLAN:   Severe AS s/p TAVR: echo today shows EF 60%, normally functioning TAVR with a mean gradient of 6 mm hg and no PVL. SBE prophylaxis discussed; I have changed clindamycin to azithromycin given high risk of C.dif with clinda. She has NYHA class II symptoms, but has had a marked symptomatic improvement since TAVR. Currently still on aspirin and Plavix. Will stop plavix now and she will continue on aspirin alone.   CAD: pre TAVR cath  showedsevere 2 vessel CAD with severe stenosis of the proximal circumflex and mid-RCA.She underwent successful two-vessel orbital atherectomy and PCI using a 3.0 x 20 mm resolute Onyx DES in the RCA, 3.0 x 8 mm resolute Onyx DES in the proximal circumflex, and 2.5 x 26 mm resolute Onyx DES in the mid circumflex. She has been on DAPT with aspirin and plavix for 1 year. As above, will stop plavix now. Continue statin.   HTN: BP borderline today. No changes made today  Medication Adjustments/Labs and Tests Ordered: Current medicines are reviewed at length with the patient today.  Concerns regarding medicines are outlined above.  Medication changes, Labs and Tests ordered today are listed in the Patient Instructions below. Patient Instructions  Medication Instructions:  1) STOP PLAVIX 2) STOP CLINDAMYCIN prior to dental visits 3) START AZITHROMYCIN 500 mg one hour prior to dental visits *If you need a refill on your cardiac medications before your next appointment, please call your pharmacy*  Follow-Up: Your provider wants you to follow-up in: 1 year with Tereso Newcomer, PA. You will receive a reminder letter in the mail two months in advance. If you don't receive a letter, please call our office to schedule the follow-up appointment.      Signed, Cline Crock, PA-C  04/21/2020 3:54 PM    [facility] [address] [place], [place], [place]  [address] Phone: [phone]; Fax: [phone]

## 2020-04-21 ENCOUNTER — Ambulatory Visit (INDEPENDENT_AMBULATORY_CARE_PROVIDER_SITE_OTHER): Payer: Medicare Other | Admitting: Physician Assistant

## 2020-04-21 ENCOUNTER — Ambulatory Visit (HOSPITAL_COMMUNITY): Payer: Medicare Other | Attending: Internal Medicine

## 2020-04-21 ENCOUNTER — Other Ambulatory Visit: Payer: Self-pay

## 2020-04-21 ENCOUNTER — Encounter: Payer: Self-pay | Admitting: Physician Assistant

## 2020-04-21 VITALS — BP 140/86 | HR 74 | Ht 58.5 in | Wt 132.0 lb

## 2020-04-21 DIAGNOSIS — I2510 Atherosclerotic heart disease of native coronary artery without angina pectoris: Secondary | ICD-10-CM | POA: Diagnosis not present

## 2020-04-21 DIAGNOSIS — Z952 Presence of prosthetic heart valve: Secondary | ICD-10-CM

## 2020-04-21 DIAGNOSIS — I10 Essential (primary) hypertension: Secondary | ICD-10-CM | POA: Diagnosis not present

## 2020-04-21 LAB — ECHOCARDIOGRAM COMPLETE
AR max vel: 1.56 cm2
AV Area VTI: 1.56 cm2
AV Area mean vel: 1.34 cm2
AV Mean grad: 6 mmHg
AV Peak grad: 10.1 mmHg
Ao pk vel: 1.59 m/s
Area-P 1/2: 3.07 cm2
S' Lateral: 3.3 cm

## 2020-04-21 MED ORDER — AZITHROMYCIN 500 MG PO TABS: ORAL_TABLET | ORAL | 9 refills | Status: DC

## 2020-04-21 NOTE — Patient Instructions (Addendum)
Medication Instructions:  1) STOP PLAVIX 2) STOP CLINDAMYCIN prior to dental visits 3) START AZITHROMYCIN 500 mg one hour prior to dental visits *If you need a refill on your cardiac medications before your next appointment, please call your pharmacy*  Follow-Up: Your provider wants you to follow-up in: 1 year with Tereso Newcomer, PA. You will receive a reminder letter in the mail two months in advance. If you don't receive a letter, please call our office to schedule the follow-up appointment.

## 2020-06-11 ENCOUNTER — Ambulatory Visit: Payer: Medicare Other | Admitting: Podiatry

## 2020-06-15 ENCOUNTER — Other Ambulatory Visit: Payer: Self-pay

## 2020-06-15 ENCOUNTER — Ambulatory Visit (INDEPENDENT_AMBULATORY_CARE_PROVIDER_SITE_OTHER): Payer: Medicare Other | Admitting: Podiatry

## 2020-06-15 DIAGNOSIS — E118 Type 2 diabetes mellitus with unspecified complications: Secondary | ICD-10-CM | POA: Diagnosis not present

## 2020-06-15 DIAGNOSIS — M79674 Pain in right toe(s): Secondary | ICD-10-CM

## 2020-06-15 DIAGNOSIS — B351 Tinea unguium: Secondary | ICD-10-CM

## 2020-06-15 DIAGNOSIS — M79675 Pain in left toe(s): Secondary | ICD-10-CM

## 2020-06-16 ENCOUNTER — Encounter: Payer: Self-pay | Admitting: Podiatry

## 2020-06-16 NOTE — Progress Notes (Signed)
  Subjective:  Patient ID: Katherine [name], female    DOB: Feb 09, 1945,  MRN: [id number]  Chief Complaint  Patient presents with  . Nail Problem    NAIL TRIM    75 y.o. female returns for the above complaint.  Patient presents with complaint of thickened elongated dystrophic toenails x10.  Pain on palpation.  She states that she is not able to bear down herself.  She would like for me to do it.  She denies any other acute complaints.She is a diabetic with last A1c of 5.7  Objective:  There were no vitals filed for this visit. Podiatric Exam: Vascular: dorsalis pedis and posterior tibial pulses are palpable bilateral. Capillary return is immediate. Temperature gradient is WNL. Skin turgor WNL  Sensorium: Normal Semmes Weinstein monofilament test. Normal tactile sensation bilaterally. Nail Exam: Pt has thick disfigured discolored nails with subungual debris noted bilateral entire nail hallux through fifth toenails.  Pain on palpation to the nails. Ulcer Exam: There is no evidence of ulcer or pre-ulcerative changes or infection. Orthopedic Exam: Muscle tone and strength are WNL. No limitations in general ROM. No crepitus or effusions noted. HAV  B/L.  Hammer toes 2-5  B/L. Skin: No Porokeratosis. No infection or ulcers    Assessment & Plan:   1. Pain due to onychomycosis of toenails of both feet   2. Type 2 diabetes mellitus with complication [facility])     Patient was evaluated and treated and all questions answered.  Onychomycosis with pain  -Nails palliatively debrided as below. -Educated on self-care  Procedure: Nail Debridement Rationale: pain  Type of Debridement: manual, sharp debridement. Instrumentation: Nail nipper, rotary burr. Number of Nails: 10  Procedures and Treatment: Consent by patient was obtained for treatment procedures. The patient understood the discussion of treatment and procedures well. All questions were answered thoroughly reviewed. Debridement of mycotic  and hypertrophic toenails, 1 through 5 bilateral and clearing of subungual debris. No ulceration, no infection noted.  Return Visit-Office Procedure: Patient instructed to return to the office for a follow up visit 3 months for continued evaluation and treatment.  Nicholes Rough, DPM    No follow-ups on file.

## 2020-06-18 ENCOUNTER — Ambulatory Visit: Payer: Medicare Other | Admitting: Podiatry

## 2020-06-19 ENCOUNTER — Other Ambulatory Visit: Payer: Self-pay | Admitting: Cardiovascular Disease

## 2020-09-15 ENCOUNTER — Other Ambulatory Visit: Payer: Self-pay

## 2020-09-15 ENCOUNTER — Encounter: Payer: Self-pay | Admitting: Podiatry

## 2020-09-15 ENCOUNTER — Ambulatory Visit (INDEPENDENT_AMBULATORY_CARE_PROVIDER_SITE_OTHER): Payer: Medicare Other | Admitting: Podiatry

## 2020-09-15 DIAGNOSIS — E118 Type 2 diabetes mellitus with unspecified complications: Secondary | ICD-10-CM | POA: Diagnosis not present

## 2020-09-15 DIAGNOSIS — B351 Tinea unguium: Secondary | ICD-10-CM

## 2020-09-15 DIAGNOSIS — M79674 Pain in right toe(s): Secondary | ICD-10-CM | POA: Diagnosis not present

## 2020-09-15 DIAGNOSIS — M79675 Pain in left toe(s): Secondary | ICD-10-CM

## 2020-09-15 NOTE — Progress Notes (Signed)
  Subjective:  Patient ID: Katherine [name], female    DOB: 12-27-1945,  MRN: [id number]  Chief Complaint  Patient presents with   Nail Problem    Nail trim    75 y.o. female returns for the above complaint.  Patient presents with complaint of thickened elongated dystrophic toenails x10.  Pain on palpation.  She states that she is not able to bear down herself.  She would like for me to do it.  She denies any other acute complaints.She is a diabetic with last A1c of 5.7  Objective:  There were no vitals filed for this visit. Podiatric Exam: Vascular: dorsalis pedis and posterior tibial pulses are palpable bilateral. Capillary return is immediate. Temperature gradient is WNL. Skin turgor WNL  Sensorium: Normal Semmes Weinstein monofilament test. Normal tactile sensation bilaterally. Nail Exam: Pt has thick disfigured discolored nails with subungual debris noted bilateral entire nail hallux through fifth toenails.  Pain on palpation to the nails. Ulcer Exam: There is no evidence of ulcer or pre-ulcerative changes or infection. Orthopedic Exam: Muscle tone and strength are WNL. No limitations in general ROM. No crepitus or effusions noted. HAV  B/L.  Hammer toes 2-5  B/L. Skin: No Porokeratosis. No infection or ulcers    Assessment & Plan:   No diagnosis found.   Patient was evaluated and treated and all questions answered.  Onychomycosis with pain  -Nails palliatively debrided as below. -Educated on self-care  Procedure: Nail Debridement Rationale: pain  Type of Debridement: manual, sharp debridement. Instrumentation: Nail nipper, rotary burr. Number of Nails: 10  Procedures and Treatment: Consent by patient was obtained for treatment procedures. The patient understood the discussion of treatment and procedures well. All questions were answered thoroughly reviewed. Debridement of mycotic and hypertrophic toenails, 1 through 5 bilateral and clearing of subungual debris. No  ulceration, no infection noted.  Return Visit-Office Procedure: Patient instructed to return to the office for a follow up visit 3 months for continued evaluation and treatment.  Nicholes Rough, DPM    Return in about 3 months (around 12/16/2020).

## 2020-12-06 ENCOUNTER — Other Ambulatory Visit: Payer: Self-pay | Admitting: Cardiology

## 2020-12-06 DIAGNOSIS — Z1231 Encounter for screening mammogram for malignant neoplasm of breast: Secondary | ICD-10-CM

## 2020-12-19 ENCOUNTER — Other Ambulatory Visit: Payer: Self-pay | Admitting: Cardiovascular Disease

## 2020-12-20 ENCOUNTER — Ambulatory Visit: Payer: Medicare Other | Admitting: Podiatry

## 2020-12-29 ENCOUNTER — Ambulatory Visit (INDEPENDENT_AMBULATORY_CARE_PROVIDER_SITE_OTHER): Payer: Medicare Other | Admitting: Podiatry

## 2020-12-29 ENCOUNTER — Other Ambulatory Visit: Payer: Self-pay

## 2020-12-29 ENCOUNTER — Encounter: Payer: Self-pay | Admitting: Podiatry

## 2020-12-29 DIAGNOSIS — M898X7 Other specified disorders of bone, ankle and foot: Secondary | ICD-10-CM

## 2020-12-29 DIAGNOSIS — E119 Type 2 diabetes mellitus without complications: Secondary | ICD-10-CM

## 2020-12-29 DIAGNOSIS — M79675 Pain in left toe(s): Secondary | ICD-10-CM | POA: Diagnosis not present

## 2020-12-29 DIAGNOSIS — M79674 Pain in right toe(s): Secondary | ICD-10-CM | POA: Diagnosis not present

## 2020-12-29 DIAGNOSIS — B351 Tinea unguium: Secondary | ICD-10-CM | POA: Diagnosis not present

## 2020-12-29 DIAGNOSIS — L84 Corns and callosities: Secondary | ICD-10-CM | POA: Diagnosis not present

## 2020-12-29 DIAGNOSIS — Z8631 Personal history of diabetic foot ulcer: Secondary | ICD-10-CM | POA: Diagnosis not present

## 2020-12-29 DIAGNOSIS — E118 Type 2 diabetes mellitus with unspecified complications: Secondary | ICD-10-CM | POA: Diagnosis not present

## 2020-12-29 NOTE — Patient Instructions (Addendum)
Choose a moisturizer from  the list below:  For normal skin: Moisturize feet once daily; do not apply between toes A.  CeraVe Daily Moisturizing Lotion B.  Lubriderm Advanced Therapy Lotion or Lubriderm Intense Skin Repair Lotion C.  Vaseline Intensive Care Lotion D.  Gold Bond Ultimate Diabetic Foot Lotion E.  Eucerin Intensive Repair Moisturizing Lotion  If you have problems reaching your feet: apply to feet once daily; do not apply between toes A.  Eucerin Aquaphor Ointment Body Spray  B.  Vaseline Intensive Care Spray Moisturizer (Unscented,  Cocoa Radiant Spray or Aloe Smooth Spray)   

## 2020-12-29 NOTE — Progress Notes (Signed)
ANNUAL DIABETIC FOOT EXAM  Subjective: Katherine [name] presents today for for annual diabetic foot examination.  Patient relates several year h/o diabetes.  Patient has positive h/o foot wound submet head 1 right foot.  Patient has diagnosis of fibromyalgia.  Patient's blood sugar was 132 mg/dl last night at bedtime. Patient did not check blood glucose this morning.  She is requesting recommendations for moisturizer for her feet.  Katherine Ginger, PA-C is patient's PCP. Last visit was November, 2022.  Past Medical History:  Diagnosis Date   ANXIETY 07/31/2007   Atrial fibrillation ([place])    a. per structural notes, previous hx remote Afib.   CAD (coronary artery disease)    a. 03/2019 orbital atherectomy/PCI with DES to RCA, prox Cx, and mid Cx.   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   s/p TAVR with a 26 mm Edwards S3U via the TF approach by Drs Burt Knack and Roxy Manns   Severe aortic stenosis    s/p TAVR   Unspecified hearing loss 10/09/2007   Patient Active Problem List   Diagnosis Date Noted   Rotator cuff arthropathy of left shoulder 11/12/2019   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   Type 2 diabetes mellitus with complication ([place])    Coronary artery disease involving native coronary artery of native heart with angina pectoris ([place])    S/P reverse total shoulder arthroplasty, 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   MRSA infection 10/26/2014   Diabetic foot ulcer ([place]) 10/26/2014   Pre-ulcerative corn or callous 10/09/2013   Altered mental status 04/29/2013   Normocytic anemia 01/06/2013   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   Thyroid nodule  11/07/2012   Severe aortic stenosis 05/27/2012   FRACTURE, PELVIS, RIGHT 04/15/2010   Fibromyalgia 05/10/2009   Anxiety state 07/31/2007   Osteoporosis 07/31/2007   Hyperlipidemia 09/05/2006   DEPRESSION 09/05/2006   Essential hypertension 09/05/2006   Allergic rhinitis 09/05/2006   GERD 09/05/2006   DIVERTICULOSIS, COLON 09/05/2006   OSTEOARTHRITIS 09/05/2006   Past Surgical History:  Procedure Laterality Date   ABDOMINAL HYSTERECTOMY  12/08   Bladder tac, partial hysterectomy   BREAST BIOPSY     CARDIAC CATHETERIZATION     catarct removal     both eyes   COLONOSCOPY     CORONARY ATHERECTOMY N/A 04/15/2019   Procedure: CORONARY ATHERECTOMY;  Surgeon: Sherren Mocha, MD;  Location: [facility];  Service: Cardiovascular;  Laterality: N/A;   CORONARY STENT INTERVENTION N/A 04/15/2019   Procedure: CORONARY STENT INTERVENTION;  Surgeon: Sherren Mocha, MD;  Location: [facility];  Service: Cardiovascular;  Laterality: N/A;   I & D KNEE WITH POLY EXCHANGE Left 01/03/2013   Procedure: IRRIGATION AND DEBRIDEMENT LEFT KNEE WITH POLY EXCHANGE;  Surgeon: Mcarthur Rossetti, MD;  Location: [facility];  Service: Orthopedics;  Laterality: Left;   inguinal herniorrhapy     right Dr. Zenia [name] 06/2009   REVERSE SHOULDER ARTHROPLASTY Right 09/07/2016   REVERSE SHOULDER ARTHROPLASTY Right 09/07/2016   Procedure: REVERSE SHOULDER ARTHROPLASTY;  Surgeon: Tania Ade, MD;  Location: [facility];  Service: Orthopedics;  Laterality: Right;  Right reverse shoulder arthroplasty   RIGHT/LEFT HEART CATH AND CORONARY ANGIOGRAPHY N/A  09/11/2018   Procedure: RIGHT/LEFT HEART CATH AND CORONARY ANGIOGRAPHY;  Surgeon: Sherren Mocha, MD;  Location: [facility];  Service: Cardiovascular;  Laterality: N/A;   TEE WITHOUT CARDIOVERSION N/A 04/22/2019   Procedure: TRANSESOPHAGEAL ECHOCARDIOGRAM (TEE);  Surgeon: Sherren Mocha, MD;  Location: [facility];  Service: Open Heart Surgery;  Laterality:  N/A;   TONSILLECTOMY     TOTAL KNEE ARTHROPLASTY Left 12/20/2012   Procedure: LEFT TOTAL KNEE ARTHROPLASTY;  Surgeon: Mcarthur Rossetti, MD;  Location: [facility];  Service: Orthopedics;  Laterality: Left;   TRANSCATHETER AORTIC VALVE REPLACEMENT, TRANSFEMORAL N/A 04/22/2019   Procedure: TRANSCATHETER AORTIC VALVE REPLACEMENT, TRANSFEMORAL;  Surgeon: Sherren Mocha, MD;  Location: [facility];  Service: Open Heart Surgery;  Laterality: N/A;   Current Outpatient Medications on File Prior to Visit  Medication Sig Dispense Refill   acetaminophen (TYLENOL) 500 MG tablet Take 500 mg by mouth every 6 (six) hours as needed.     albuterol (VENTOLIN HFA) 108 (90 Base) MCG/ACT inhaler Inhale 1 puff into the lungs 2 (two) times daily as needed for wheezing or shortness of breath.      alendronate (FOSAMAX) 70 MG tablet Take 70 mg by mouth once a week.     amLODipine (NORVASC) 10 MG tablet Take 10 mg by mouth daily.     ascorbic acid (VITAMIN C) 500 MG tablet Take 500 mg by mouth daily.     aspirin EC 81 MG tablet Take 81 mg by mouth daily.      azithromycin (ZITHROMAX) 500 MG tablet Take 1 tablet (500 mg) one hour prior to all dental visits. 3 tablet 9   benztropine (COGENTIN) 0.5 MG tablet Take 0.5 mg by mouth 2 (two) times daily.     beta carotene 25000 UNIT capsule Take 25,000 Units by mouth daily.     busPIRone (BUSPAR) 10 MG tablet Take 10 mg by mouth 3 (three) times daily.     calcium-vitamin D (OSCAL WITH D) 500-200 MG-UNIT tablet Take 1 tablet by mouth 3 (three) times daily. 270 tablet 3   cetirizine (ZYRTEC) 10 MG tablet TAKE 1 TABLET BY MOUTH DAILY 30 tablet 0   diclofenac sodium (VOLTAREN) 1 % GEL Apply 2-4 g topically 4 (four) times daily as needed (Pain).      diclofenac Sodium (VOLTAREN) 1 % GEL SMARTSIG:Gram(s) Topical As Directed     fluticasone (FLONASE) 50 MCG/ACT nasal spray Place 1 spray into both nostrils daily as needed for allergies.      gabapentin (NEURONTIN) 100 MG  capsule Take 100 mg by mouth 3 (three) times daily.      hydrochlorothiazide (MICROZIDE) 12.5 MG capsule Take 12.5 mg by mouth daily.     hydrOXYzine (ATARAX/VISTARIL) 10 MG tablet Take 1 tablet (10 mg total) by mouth 2 (two) times daily as needed. 30 tablet 11   ipratropium (ATROVENT) 0.06 % nasal spray USE 1 SPRAY IN EACH NOSTRIL THREE TIMES DAILY 30 mL 11   metFORMIN (GLUCOPHAGE) 500 MG tablet Take 500 mg by mouth 2 (two) times daily.      Multiple Vitamin (MULTIVITAMIN WITH MINERALS) TABS tablet Take 1 tablet by mouth daily.     NAMZARIC 28-10 MG CP24 Take 1 capsule by mouth daily.     NAMZARIC 7 & 14 & 21 &28 -10 MG C4PK Per patient taking one capsule daily     nitroGLYCERIN (NITROSTAT) 0.4 MG SL tablet DISSOLVE 1 TABLET UNDER THE TONGUE EVERY 5 MINUTES AS NEEDED FOR  CHEST PAIN AS DIRECTED 25 tablet 3   Omega-3 1000 MG CAPS Take by mouth daily.     ONETOUCH DELICA LANCETS 73A MISC CHECK BLOOD SUGAR ONCE DAILY AS DIRECTED 100 each 0   ONETOUCH VERIO test strip SMARTSIG:1 Each Via Meter Twice Daily     pantoprazole (PROTONIX) 40 MG tablet TAKE 1 TABLET BY MOUTH DAILY 90 tablet 0   Potassium Chloride ER 20 MEQ TBCR Take 20 mEq by mouth 2 (two) times daily.   1   pyridOXINE (VITAMIN B-6) 100 MG tablet Take 100 mg by mouth daily.     risperiDONE (RISPERDAL) 3 MG tablet Take 3 mg by mouth at bedtime.     rosuvastatin (CRESTOR) 40 MG tablet TAKE 1 TABLET(40 MG) BY MOUTH DAILY AT 6 PM 90 tablet 3   UREA 20 INTENSIVE HYDRATING 20 % cream Apply 1 application topically daily as needed (dry skin). For dry skin  6   No current facility-administered medications on file prior to visit.    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 PATIENT HAS HAD A PCN REACTION WITH IMMEDIATE RASH, FACIAL/TONGUE/THROAT SWELLING, SOB, OR LIGHTHEADEDNESS WITH HYPOTENSION:  #  #  #  YES  #  #  #   Has patient had a PCN reaction causing severe rash involving  mucus membranes or skin necrosis: Unknown Has patient had a PCN reaction that required hospitalization: Unknown Has patient had a PCN reaction occurring within the last 10 years: Unknown    Pollen Extract     UNSPECIFIED REACTION    Coconut Oil Itching    UNSPECIFIED REACTION    Mellaril [Thioridazine] Itching   Social History   Occupational History   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  Tobacco Use   Smoking status: Never   Smokeless tobacco: Never  Vaping Use   Vaping Use: Never used  Substance and Sexual Activity   Alcohol use: No    Alcohol/week: 0.0 standard drinks   Drug use: No   Sexual activity: Not Currently    Birth control/protection: Surgical   Family History  Problem Relation Age of Onset   Coronary artery disease Other    Diabetes Other    Heart failure Mother    Heart failure Father    Colon cancer Neg Hx    Esophageal cancer Neg Hx    Stomach cancer Neg Hx    Rectal cancer Neg Hx    Immunization History  Administered Date(s) Administered   Influenza Split 10/12/2010, 11/07/2011   Influenza Whole 11/30/2005, 10/24/2007, 10/27/2008   Influenza, High Dose Seasonal PF 11/07/2012, 12/11/2013   Influenza-Unspecified 11/28/2014   Pneumococcal Conjugate-13 12/04/2012   Pneumococcal Polysaccharide-23 01/27/2008   Td 01/27/2008   Tdap 05/18/2016   Zoster, Live 05/09/2012     Review of Systems: Negative except as noted in the HPI.   Objective: There were no vitals filed for this visit.  RUMAISA SCHNETZER is a pleasant 75 y.o. female in NAD. AAO X 3.  Vascular Examination: CFT immediate b/l LE. Palpable DP/PT pulses b/l LE. Digital hair absent b/l. Skin temperature gradient WNL b/l. No pain with calf compression b/l. No edema noted b/l. No cyanosis or clubbing noted b/l LE.  Dermatological Examination: Pedal skin thin and atrophic b/l LE. No open wounds b/l LE. No interdigital macerations noted b/l LE. Toenails 1-5 b/l  elongated, discolored, dystrophic, thickened, crumbly with subungual debris and  tenderness to dorsal palpation. Preulcerative lesion noted submet head 1 right foot. There is visible subdermal hemorrhage. There is no surrounding erythema, no edema, no drainage, no odor, no fluctuance.  Musculoskeletal Examination: Normal muscle strength 5/5 to all lower extremity muscle groups bilaterally. Palpable dorsal exostosis noted 1st met cuneiform joint. Hammertoe deformity noted 2-5 b/l.[name] No pain, crepitus or joint limitation noted with ROM b/l LE.  Patient ambulates independently without assistive aids.  Footwear Assessment: Does the patient wear appropriate shoes? Yes. Does the patient need inserts/orthotics? Yes.  Neurological Examination: Protective sensation intact 5/5 intact bilaterally with 10g monofilament b/l. Vibratory sensation decreased b/l.  Assessment: 1. Pain due to onychomycosis of toenails of both feet   2. Pre-ulcerative corn or callous   3. History of diabetic ulcer of foot   4. Osteoarthritis of ankle and foot, unspecified laterality   5. Type 2 diabetes mellitus with complication (HCC)   6. Encounter for diabetic foot exam ([place])     ADA Risk Categorization:  High Risk  Patient has one or more of the following: Loss of protective sensation Absent pedal pulses Severe Foot deformity History of foot ulcer  Plan: -Examined patient. -Diabetic foot examination performed today. -Continue diabetic foot care principles: inspect feet daily, monitor glucose as recommended by PCP and/or Endocrinologist, and follow prescribed diet per PCP, Endocrinologist and/or dietician. -Patient to continue soft, supportive shoe gear daily. Start procedure for diabetic shoes. Patient qualifies based on diagnoses. -Mycotic toenails 1-5 bilaterally were debrided in length and girth with sterile nail nippers and dremel without incident. -Callus(es) submet head 1 right foot pared utilizing sterile  scalpel blade without complication or incident. Total number debrided =1. -For dry skin, patient was given written list of OTC moisturizers. Patient/POA instructed to apply to foot/feet once daily avoiding application between toes.  -Patient/POA to call should there be question/concern in the interim.  Return in about 3 months (around 03/29/2021).  Marzetta Board, DPM

## 2021-01-10 ENCOUNTER — Ambulatory Visit: Payer: Medicare Other

## 2021-02-03 ENCOUNTER — Ambulatory Visit: Payer: Medicare Other

## 2021-02-21 ENCOUNTER — Ambulatory Visit: Payer: Medicare Other

## 2021-02-28 ENCOUNTER — Ambulatory Visit: Payer: Medicaid Other

## 2021-03-10 ENCOUNTER — Ambulatory Visit
Admission: RE | Admit: 2021-03-10 | Discharge: 2021-03-10 | Disposition: A | Discharge status: Home / Self Care | Payer: 59 | Source: Ambulatory Visit | Attending: Cardiology | Admitting: Cardiology

## 2021-03-10 DIAGNOSIS — Z1231 Encounter for screening mammogram for malignant neoplasm of breast: Secondary | ICD-10-CM

## 2021-04-04 ENCOUNTER — Ambulatory Visit: Payer: Medicare Other

## 2021-04-04 ENCOUNTER — Ambulatory Visit: Payer: Medicare Other | Admitting: Podiatry

## 2021-04-23 ENCOUNTER — Other Ambulatory Visit: Payer: Self-pay

## 2021-04-23 ENCOUNTER — Observation Stay (HOSPITAL_COMMUNITY)
Admission: EM | Admit: 2021-04-23 | Discharge: 2021-04-26 | Disposition: A | Discharge status: Skilled Nursing Facility | Payer: 59 | Attending: Internal Medicine | Admitting: Internal Medicine

## 2021-04-23 ENCOUNTER — Emergency Department (HOSPITAL_COMMUNITY): Payer: 59

## 2021-04-23 ENCOUNTER — Encounter (HOSPITAL_COMMUNITY): Payer: Self-pay

## 2021-04-23 DIAGNOSIS — Z96652 Presence of left artificial knee joint: Secondary | ICD-10-CM | POA: Diagnosis not present

## 2021-04-23 DIAGNOSIS — I480 Paroxysmal atrial fibrillation: Secondary | ICD-10-CM | POA: Insufficient documentation

## 2021-04-23 DIAGNOSIS — L97509 Non-pressure chronic ulcer of other part of unspecified foot with unspecified severity: Secondary | ICD-10-CM | POA: Insufficient documentation

## 2021-04-23 DIAGNOSIS — Z96611 Presence of right artificial shoulder joint: Secondary | ICD-10-CM | POA: Diagnosis not present

## 2021-04-23 DIAGNOSIS — Z91040 Latex allergy status: Secondary | ICD-10-CM | POA: Diagnosis not present

## 2021-04-23 DIAGNOSIS — R251 Tremor, unspecified: Secondary | ICD-10-CM | POA: Diagnosis not present

## 2021-04-23 DIAGNOSIS — E11621 Type 2 diabetes mellitus with foot ulcer: Secondary | ICD-10-CM | POA: Insufficient documentation

## 2021-04-23 DIAGNOSIS — S0990XA Unspecified injury of head, initial encounter: Secondary | ICD-10-CM | POA: Diagnosis not present

## 2021-04-23 DIAGNOSIS — Z7984 Long term (current) use of oral hypoglycemic drugs: Secondary | ICD-10-CM | POA: Insufficient documentation

## 2021-04-23 DIAGNOSIS — Z79899 Other long term (current) drug therapy: Secondary | ICD-10-CM | POA: Diagnosis not present

## 2021-04-23 DIAGNOSIS — I700 Atherosclerosis of aorta: Secondary | ICD-10-CM | POA: Diagnosis not present

## 2021-04-23 DIAGNOSIS — Z7982 Long term (current) use of aspirin: Secondary | ICD-10-CM | POA: Insufficient documentation

## 2021-04-23 DIAGNOSIS — W01190A Fall on same level from slipping, tripping and stumbling with subsequent striking against furniture, initial encounter: Secondary | ICD-10-CM | POA: Insufficient documentation

## 2021-04-23 DIAGNOSIS — E118 Type 2 diabetes mellitus with unspecified complications: Secondary | ICD-10-CM | POA: Diagnosis present

## 2021-04-23 DIAGNOSIS — S32401A Unspecified fracture of right acetabulum, initial encounter for closed fracture: Secondary | ICD-10-CM | POA: Diagnosis not present

## 2021-04-23 DIAGNOSIS — E785 Hyperlipidemia, unspecified: Secondary | ICD-10-CM | POA: Diagnosis not present

## 2021-04-23 DIAGNOSIS — S79911A Unspecified injury of right hip, initial encounter: Secondary | ICD-10-CM | POA: Diagnosis present

## 2021-04-23 DIAGNOSIS — S329XXA Fracture of unspecified parts of lumbosacral spine and pelvis, initial encounter for closed fracture: Secondary | ICD-10-CM | POA: Diagnosis present

## 2021-04-23 DIAGNOSIS — K219 Gastro-esophageal reflux disease without esophagitis: Secondary | ICD-10-CM | POA: Diagnosis present

## 2021-04-23 DIAGNOSIS — I10 Essential (primary) hypertension: Secondary | ICD-10-CM | POA: Diagnosis present

## 2021-04-23 DIAGNOSIS — Y92009 Unspecified place in unspecified non-institutional (private) residence as the place of occurrence of the external cause: Secondary | ICD-10-CM | POA: Diagnosis not present

## 2021-04-23 DIAGNOSIS — S32591A Other specified fracture of right pubis, initial encounter for closed fracture: Principal | ICD-10-CM | POA: Insufficient documentation

## 2021-04-23 DIAGNOSIS — I2510 Atherosclerotic heart disease of native coronary artery without angina pectoris: Secondary | ICD-10-CM | POA: Diagnosis not present

## 2021-04-23 DIAGNOSIS — M25551 Pain in right hip: Secondary | ICD-10-CM

## 2021-04-23 DIAGNOSIS — W19XXXA Unspecified fall, initial encounter: Secondary | ICD-10-CM

## 2021-04-23 LAB — CBC WITH DIFFERENTIAL/PLATELET
Abs Immature Granulocytes: 0.07 10*3/uL (ref 0.00–0.07)
Basophils Absolute: 0 10*3/uL (ref 0.0–0.1)
Basophils Relative: 0 %
Eosinophils Absolute: 0 10*3/uL (ref 0.0–0.5)
Eosinophils Relative: 0 %
HCT: 35.9 % — ABNORMAL LOW (ref 36.0–46.0)
Hemoglobin: 12.3 g/dL (ref 12.0–15.0)
Immature Granulocytes: 0 %
Lymphocytes Relative: 5 %
Lymphs Abs: 0.7 10*3/uL (ref 0.7–4.0)
MCH: 31.7 pg (ref 26.0–34.0)
MCHC: 34.3 g/dL (ref 30.0–36.0)
MCV: 92.5 fL (ref 80.0–100.0)
Monocytes Absolute: 0.8 10*3/uL (ref 0.1–1.0)
Monocytes Relative: 5 %
Neutro Abs: 14.9 10*3/uL — ABNORMAL HIGH (ref 1.7–7.7)
Neutrophils Relative %: 90 %
Platelets: 183 10*3/uL (ref 150–400)
RBC: 3.88 MIL/uL (ref 3.87–5.11)
RDW: 14.6 % (ref 11.5–15.5)
WBC: 16.5 10*3/uL — ABNORMAL HIGH (ref 4.0–10.5)
nRBC: 0 % (ref 0.0–0.2)

## 2021-04-23 LAB — COMPREHENSIVE METABOLIC PANEL
ALT: 28 U/L (ref 0–44)
AST: 49 U/L — ABNORMAL HIGH (ref 15–41)
Albumin: 4 g/dL (ref 3.5–5.0)
Alkaline Phosphatase: 43 U/L (ref 38–126)
Anion gap: 8 (ref 5–15)
BUN: 26 mg/dL — ABNORMAL HIGH (ref 8–23)
CO2: 25 mmol/L (ref 22–32)
Calcium: 8.7 mg/dL — ABNORMAL LOW (ref 8.9–10.3)
Chloride: 104 mmol/L (ref 98–111)
Creatinine, Ser: 0.45 mg/dL (ref 0.44–1.00)
GFR, Estimated: >60 mL/min (ref >60)
Glucose, Bld: 138 mg/dL — ABNORMAL HIGH (ref 70–99)
Potassium: 3.2 mmol/L — ABNORMAL LOW (ref 3.5–5.1)
Sodium: 137 mmol/L (ref 135–145)
Total Bilirubin: 1.5 mg/dL — ABNORMAL HIGH (ref 0.3–1.2)
Total Protein: 6.7 g/dL (ref 6.5–8.1)

## 2021-04-23 LAB — MAGNESIUM: Magnesium: 1.9 mg/dL (ref 1.7–2.4)

## 2021-04-23 LAB — GLUCOSE, CAPILLARY: Glucose-Capillary: 200 mg/dL — ABNORMAL HIGH (ref 70–99)

## 2021-04-23 LAB — PHOSPHORUS: Phosphorus: 2.7 mg/dL (ref 2.5–4.6)

## 2021-04-23 MED ORDER — KETOROLAC TROMETHAMINE 15 MG/ML IJ SOLN
15.0000 mg | Freq: Once | INTRAMUSCULAR | Status: AC
Start: 2021-04-23 — End: 2021-04-23
  Administered 2021-04-23: 15 mg via INTRAVENOUS
  Filled 2021-04-23: qty 1

## 2021-04-23 MED ORDER — OXYCODONE HCL 5 MG PO TABS
5.0000 mg | ORAL_TABLET | ORAL | Status: DC | PRN
  Administered 2021-04-23 – 2021-04-25 (×8): 5 mg via ORAL
  Filled 2021-04-23 (×9): qty 1

## 2021-04-23 MED ORDER — ONDANSETRON HCL 4 MG PO TABS: 4.0000 mg | ORAL_TABLET | Freq: Four times a day (QID) | ORAL | Status: DC | PRN

## 2021-04-23 MED ORDER — ACETAMINOPHEN 325 MG PO TABS
650.0000 mg | ORAL_TABLET | Freq: Four times a day (QID) | ORAL | Status: DC | PRN
  Administered 2021-04-24 – 2021-04-25 (×3): 650 mg via ORAL
  Filled 2021-04-23 (×3): qty 2

## 2021-04-23 MED ORDER — ACETAMINOPHEN 325 MG PO TABS
650.0000 mg | ORAL_TABLET | Freq: Once | ORAL | Status: AC
  Administered 2021-04-23: 650 mg via ORAL
  Filled 2021-04-23: qty 2

## 2021-04-23 MED ORDER — ACETAMINOPHEN 650 MG RE SUPP: 650.0000 mg | Freq: Four times a day (QID) | RECTAL | Status: DC | PRN

## 2021-04-23 MED ORDER — PANTOPRAZOLE SODIUM 40 MG PO TBEC
40.0000 mg | DELAYED_RELEASE_TABLET | Freq: Every day | ORAL | Status: DC
Start: 2021-04-23 — End: 2021-04-26
  Administered 2021-04-23 – 2021-04-25 (×3): 40 mg via ORAL
  Filled 2021-04-23 (×3): qty 1

## 2021-04-23 MED ORDER — ONDANSETRON HCL 4 MG/2ML IJ SOLN
4.0000 mg | Freq: Four times a day (QID) | INTRAMUSCULAR | Status: DC | PRN
Start: 2021-04-23 — End: 2021-04-26

## 2021-04-23 MED ORDER — POTASSIUM CHLORIDE CRYS ER 20 MEQ PO TBCR
40.0000 meq | EXTENDED_RELEASE_TABLET | Freq: Once | ORAL | Status: AC
  Administered 2021-04-23: 40 meq via ORAL
  Filled 2021-04-23: qty 2

## 2021-04-23 MED ORDER — NITROGLYCERIN 0.4 MG SL SUBL: 0.4000 mg | SUBLINGUAL_TABLET | SUBLINGUAL | Status: DC | PRN

## 2021-04-23 MED ORDER — ENOXAPARIN SODIUM 40 MG/0.4ML IJ SOSY
40.0000 mg | PREFILLED_SYRINGE | INTRAMUSCULAR | Status: DC
  Administered 2021-04-23 – 2021-04-25 (×3): 40 mg via SUBCUTANEOUS
  Filled 2021-04-23 (×3): qty 0.4

## 2021-04-23 MED ORDER — FENTANYL CITRATE PF 50 MCG/ML IJ SOSY
50.0000 ug | PREFILLED_SYRINGE | Freq: Once | INTRAMUSCULAR | Status: AC
  Administered 2021-04-23: 50 ug via INTRAVENOUS
  Filled 2021-04-23: qty 1

## 2021-04-23 MED ORDER — ENSURE ENLIVE PO LIQD
237.0000 mL | Freq: Two times a day (BID) | ORAL | Status: DC
  Administered 2021-04-24 – 2021-04-25 (×4): 237 mL via ORAL

## 2021-04-23 MED ORDER — RISPERIDONE 2 MG PO TABS
3.0000 mg | ORAL_TABLET | Freq: Every day | ORAL | Status: DC
  Administered 2021-04-23 – 2021-04-25 (×3): 3 mg via ORAL
  Filled 2021-04-23 (×3): qty 1

## 2021-04-23 NOTE — ED Triage Notes (Signed)
Patient BIB [facility] from home. Patient states she feel last night around 6pm. Denies LOC, hitting head, and patient is not on blood thinners. Patient is c/o right hip pain. No obvious deformities noted. Hx diabetes. ?

## 2021-04-23 NOTE — ED Provider Notes (Signed)
I discussed with Dr. Magnus Ivan Orthopaedist.  He reports pt will not need surgery. Pt can have a diet and can do 50 percent weight bearing.  Hospitalist consulted for admission  ?  ?Katherine [name], [place] ?04/23/21 1535 ? ?  ?Alvira Monday, MD ?04/25/21 [phone] ? ?

## 2021-04-23 NOTE — ED Provider Notes (Signed)
?[facility] DEPT ?Provider Note ? ? ?CSN: [id number] ?Arrival date & time: 04/23/21  1041 ? ?  ? ?History ? ?No chief complaint on file. ? ? ?Katherine [name] is a 76 y.o. female. ? ?76 y.o female with a past medical history of DM, Afib, GERD, Fibromyalgia, Osteoporosis presents to the ED via EMS status post fall.  Patient reports she tripped on the leg of the coffee table, fell backwards striking her right hip.  She ambulates at baseline with a walker along with a cane, has been ambulatory since the incident.  She did not strike her head, she is currently not on any blood thinners and did not lose consciousness.  She has taken some Tylenol for pain but has not reported any improvement in her symptoms.  He denies any headache, vision changes, weakness, other complaints. ? ?The history is provided by the patient.  ? ?  ? ?Home Medications ?Prior to Admission medications   ?Medication Sig Start Date End Date Taking? Authorizing Provider  ?acetaminophen (TYLENOL) 500 MG tablet Take 500 mg by mouth every 6 (six) hours as needed.    [provider]  ?albuterol (VENTOLIN HFA) 108 (90 Base) MCG/ACT inhaler Inhale 1 puff into the lungs 2 (two) times daily as needed for wheezing or shortness of breath.  08/16/18   [provider]  ?alendronate (FOSAMAX) 70 MG tablet Take 70 mg by mouth once a week. 11/23/20   [provider]  ?amLODipine (NORVASC) 10 MG tablet Take 10 mg by mouth daily.    [provider]  ?ascorbic acid (VITAMIN C) 500 MG tablet Take 500 mg by mouth daily.    [provider]  ?aspirin EC 81 MG tablet Take 81 mg by mouth daily.     [provider]  ?azithromycin (ZITHROMAX) 500 MG tablet Take 1 tablet (500 mg) one hour prior to all dental visits. 04/21/20   Eileen Stanford, PA-C  ?benztropine (COGENTIN) 0.5 MG tablet Take 0.5 mg by mouth 2 (two) times daily.    [provider]  ?beta carotene 25000 UNIT capsule  Take 25,000 Units by mouth daily.    [provider]  ?busPIRone (BUSPAR) 10 MG tablet Take 10 mg by mouth 3 (three) times daily.    [provider]  ?calcium-vitamin D (OSCAL WITH D) 500-200 MG-UNIT tablet Take 1 tablet by mouth 3 (three) times daily. 02/25/15   Biagio Borg, MD  ?cetirizine (ZYRTEC) 10 MG tablet TAKE 1 TABLET BY MOUTH DAILY 11/02/15   Biagio Borg, MD  ?diclofenac sodium (VOLTAREN) 1 % GEL Apply 2-4 g topically 4 (four) times daily as needed (Pain).  04/10/18   [provider]  ?diclofenac Sodium (VOLTAREN) 1 % GEL SMARTSIG:Gram(s) Topical As Directed 07/18/20   [provider]  ?fluticasone (FLONASE) 50 MCG/ACT nasal spray Place 1 spray into both nostrils daily as needed for allergies.  06/05/18   [provider]  ?gabapentin (NEURONTIN) 100 MG capsule Take 100 mg by mouth 3 (three) times daily.  04/20/16   [provider]  ?hydrochlorothiazide (MICROZIDE) 12.5 MG capsule Take 12.5 mg by mouth daily. 12/03/20   [provider]  ?hydrOXYzine (ATARAX/VISTARIL) 10 MG tablet Take 1 tablet (10 mg total) by mouth 2 (two) times daily as needed. 12/11/13   Biagio Borg, MD  ?ipratropium (ATROVENT) 0.06 % nasal spray USE 1 SPRAY IN [facility] NOSTRIL THREE TIMES DAILY 07/26/15   Biagio Borg, MD  ?metFORMIN (GLUCOPHAGE)  500 MG tablet Take 500 mg by mouth 2 (two) times daily.  05/18/16   [provider]  ?Multiple Vitamin (MULTIVITAMIN WITH MINERALS) TABS tablet Take 1 tablet by mouth daily.    [provider]  ?NAMZARIC 28-10 MG CP24 Take 1 capsule by mouth daily. 11/24/20   [provider]  ?[facility] 7 & 14 & 21 &28 -10 MG C4PK Per patient taking one capsule daily 08/01/19   [provider]  ?nitroGLYCERIN (NITROSTAT) 0.4 MG SL tablet DISSOLVE 1 TABLET UNDER THE TONGUE EVERY 5 MINUTES AS NEEDED FOR CHEST PAIN AS DIRECTED 12/20/20   Sherren Mocha, MD  ?Omega-3 1000 MG CAPS Take by mouth daily.    [provider]  ?Jonetta Speak LANCETS [id number] MISC CHECK BLOOD SUGAR ONCE DAILY AS DIRECTED 06/23/15   Biagio Borg, MD  ?Roma Schanz test strip SMARTSIG:1 Each Via Meter Twice Daily 11/17/20   [provider]  ?pantoprazole (PROTONIX) 40 MG tablet TAKE 1 TABLET BY MOUTH DAILY 11/16/15   Biagio Borg, MD  ?Potassium Chloride ER 20 MEQ TBCR Take 20 mEq by mouth 2 (two) times daily.  07/04/16   [provider]  ?pyridOXINE (VITAMIN B-6) 100 MG tablet Take 100 mg by mouth daily.    [provider]  ?risperiDONE (RISPERDAL) 3 MG tablet Take 3 mg by mouth at bedtime.    [provider]  ?rosuvastatin (CRESTOR) 40 MG tablet TAKE 1 TABLET(40 MG) BY MOUTH DAILY AT 6 PM 06/22/20   Richardson Dopp T, PA-C  ?UREA 20 INTENSIVE HYDRATING 20 % cream Apply 1 application topically daily as needed (dry skin). For dry skin 08/07/16   [provider]  ?   ? ?Allergies    ?Penicillins, Pollen extract, Coconut oil, and Mellaril [thioridazine]   ? ?Review of Systems   ?Review of Systems  ?Constitutional:  Negative for chills and fever.  ?Respiratory:  Negative for shortness of breath.   ?Cardiovascular:  Negative for chest pain.  ?Gastrointestinal:  Negative for abdominal pain, nausea and vomiting.  ?Musculoskeletal:  Positive for arthralgias.  ?Neurological:  Negative for syncope, light-headedness and headaches.  ?All other systems reviewed and are negative. ? ?Physical Exam ?Updated Vital Signs ?BP (!) 131/91   Pulse 92   Temp 98 ?F (36.7 ?C) (Oral)   Resp 18   Ht 4' 10.5" (1.486 m)   Wt 58.5 kg   SpO2 93%   BMI 26.50 kg/m?  ?Physical Exam ?Vitals and nursing note reviewed.  ?Constitutional:   ?   Appearance: Normal appearance.  ?HENT:  ?   Head: Normocephalic and atraumatic.  ?   Comments: No palpable deformities, no pain with palpation along the entire head and cervical spine. ?   Mouth/Throat:  ?   Mouth: Mucous membranes are moist.  ?Eyes:  ?   Pupils: Pupils are equal, round, and reactive to  light.  ?Cardiovascular:  ?   Rate and Rhythm: Normal rate.  ?   Pulses:     ?     Dorsalis pedis pulses are 1+ on the right side and 1+ on the left side.  ?     Posterior tibial pulses are 1+ on the right side and 1+ on the left side.  ?Pulmonary:  ?   Effort: Pulmonary effort is normal.  ?Abdominal:  ?   General: Abdomen is flat.  ?   Comments: Abdomen is soft, bruising noted to the right side but without any pain or guarding.  ?  Musculoskeletal:  ?   Cervical back: Normal range of motion and neck supple.  ?   Right hip: Tenderness present. No deformity or lacerations. Decreased range of motion.  ?   Left hip: No tenderness. Normal range of motion.  ?   Comments: Decreased range of motion with bilateral lower extremities.  Tenderness to palpation around the right femoral neck area.  No bruising noted.  Bilateral pulses present and symmetric.  ?Neurological:  ?   Mental Status: She is alert.  ?   Comments: Decrease in leg raise to the right > left. ?deCreased range of motion at the hip, knee.  Sensation is intact throughout.  ? ? ?ED Results / Procedures / Treatments   ?Labs ?(all labs ordered are listed, but only abnormal results are displayed) ?Labs Reviewed  ?CBC WITH DIFFERENTIAL/PLATELET - Abnormal; Notable for the following components:  ?    Result Value  ? WBC 16.5 (*)   ? HCT 35.9 (*)   ? Neutro Abs 14.9 (*)   ? All other components within normal limits  ?COMPREHENSIVE METABOLIC PANEL - Abnormal; Notable for the following components:  ? Potassium 3.2 (*)   ? Glucose, Bld 138 (*)   ? BUN 26 (*)   ? Calcium 8.7 (*)   ? AST 49 (*)   ? Total Bilirubin 1.5 (*)   ? All other components within normal limits  ? ? ?EKG ?None ? ?Radiology ?DG Chest 2 View ? ?Result Date: 04/23/2021 ?CLINICAL DATA:  Pre-admission chest radiograph. EXAM: CHEST - 2 VIEW COMPARISON:  Chest radiograph dated 04/18/2019. FINDINGS: The heart size is enlarged. Vascular calcifications are seen in the aortic arch. An aortic valve replacement is  noted. Mild atelectasis/scarring is seen in the left mid lung, similar to prior exam. The right lung is clear pleura there is no pleural effusion or pneumothorax. Degenerative changes are seen in the spine and left

## 2021-04-23 NOTE — Progress Notes (Signed)
Patient ID: Katherine [name], female   DOB: Jul 16, 1945, 76 y.o.   MRN: [id number] ?I did review the patient's plain films and CT scan that showed fractures involving her pelvis.  She has a nondisplaced right sacral ala fracture and minimal fracture involving the superior pubic rami that extends close to the acetabulum.  Fortunately, there is no surgical indication for these types of fractures.  Nonoperative conservative care is recommended.  She can bear up to 50% weight on her right lower extremity as comfort allows. ?

## 2021-04-23 NOTE — H&P (Signed)
?History and Physical  ? ? ?Patient: Katherine [name] [id number] DOB: May 22, 1945 ?DOA: 04/23/2021 ?DOS: the patient was seen and examined on 04/23/2021 ?PCP: [name], PA-C  ?Patient coming from: Home ? ?Chief Complaint: Fall. ? ?HPI: Katherine [name] is a 76 y.o. female with medical history significant of anxiety, depression, atrial fibrillation, CAD, type II DM, diabetic foot ulcer, colon diverticulosis, fibromyalgia, GERD, hyperlipidemia, hypertension, MRSA infection, osteoarthritis, osteoporosis, status post TAVR, unspecified hearing loss who is coming to the emergency department with complaints of having a mechanical/accidental fall at home, while trying to ambulate with her walker, last night around 1800 injuring her right side and developed pain right hip pain.  There were no prodromal symptoms like palpitations, dizziness, nausea, diaphoresis or chest pain.  No fever, chills, headache, sore throat, rhinorrhea, wheezing, dyspnea or hemoptysis.  No PND, orthopnea, but gets occasional lower extremity edema.  Denied abdominal pain, diarrhea, constipation, melena or hematochezia. ? ?ED course: Initial vital signs were temperature 98 ?F, pulse 90, respirations 16, BP 133/90 mmHg and O2 sat 95% on room air.  She was given acetaminophen 650 mg and fentanyl 50 mcg IVP. ? ?Lab work: CBC showed a white count of 16.5, hemoglobin 12.3 g/dL platelets 183.  CMP shows a potassium of 3.2 mmol/L.  Bilirubin of 1.5, glucose of 138, BUN of 26 and calcium of 8.7 mg/dL.  AST is slightly elevated at 49 units/L. ? ?Imaging:  Hip and pelvic x-ray showed no evidence of hip fractures.  There were chronic pubic bone fractures.CT of the pelvis show acute nondisplaced fractures involving the right superior pubic ramus with extension to involve the weightbearing surface of the acetabulum.  There is an acute nondisplaced fracture involving the right sacral alla with extension to involve the right SI joint.  She has chronic  fractures and residual deformity involving the bilateral superior and inferior pubic rami with extension to involve the pubic symphysis.  There was aortic atherosclerosis.  No acute findings on CT head and CT C-spine. ?  ?Review of Systems: As mentioned in the history of present illness. All other systems reviewed and are negative. ?Past Medical History:  ?Diagnosis Date  ? ANXIETY 07/31/2007  ? Atrial fibrillation ([place])   ? a. per structural notes, previous hx remote Afib.  ? CAD (coronary artery disease)   ? a. 03/2019 orbital atherectomy/PCI with DES to RCA, prox Cx, and mid Cx.  ? DEPRESSION 09/05/2006  ? DIABETES MELLITUS, TYPE II   ? Diabetic foot ulcer ([place]) 10/26/2014  ? DIVERTICULOSIS, COLON 09/05/2006  ? FIBROMYALGIA 05/10/2009  ? GERD 09/05/2006  ? HYPERLIPIDEMIA 09/05/2006  ? HYPERTENSION 09/05/2006  ? MRSA infection 10/26/2014  ? OSTEOARTHRITIS 09/05/2006  ? OSTEOPOROSIS 07/31/2007  ? 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 s/p TAVR with a 26 mm Edwards S3U via the TF approach by Drs Burt Knack and Roxy Manns  ? Severe aortic stenosis   ? s/p TAVR  ? Unspecified hearing loss 10/09/2007  ? ?Past Surgical History:  ?Procedure Laterality Date  ? ABDOMINAL HYSTERECTOMY  12/08  ? Bladder tac, partial hysterectomy  ? BREAST BIOPSY    ? CARDIAC CATHETERIZATION    ? catarct removal    ? both eyes  ? COLONOSCOPY    ? CORONARY ATHERECTOMY N/A 04/15/2019  ? Procedure: CORONARY ATHERECTOMY;  Surgeon: Sherren Mocha, MD;  Location: [facility];  Service: Cardiovascular;  Laterality: N/A;  ? CORONARY STENT INTERVENTION N/A 04/15/2019  ? Procedure: CORONARY STENT INTERVENTION;  Surgeon: Sherren Mocha, MD;  Location: [facility];  Service: Cardiovascular;  Laterality: N/A;  ? I & D KNEE WITH POLY EXCHANGE Left 01/03/2013  ? Procedure: IRRIGATION AND DEBRIDEMENT LEFT KNEE WITH POLY EXCHANGE;  Surgeon: Mcarthur Rossetti, MD;  Location: [facility];  Service: Orthopedics;  Laterality: Left;  ? inguinal herniorrhapy     ? right Dr. [name] 06/2009  ? REVERSE SHOULDER ARTHROPLASTY Right 09/07/2016  ? REVERSE SHOULDER ARTHROPLASTY Right 09/07/2016  ? Procedure: REVERSE SHOULDER ARTHROPLASTY;  Surgeon: Tania Ade, MD;  Location: [facility];  Service: Orthopedics;  Laterality: Right;  Right reverse shoulder arthroplasty  ? RIGHT/LEFT HEART CATH AND CORONARY ANGIOGRAPHY N/A 09/11/2018  ? Procedure: RIGHT/LEFT HEART CATH AND CORONARY ANGIOGRAPHY;  Surgeon: Sherren Mocha, MD;  Location: [facility];  Service: Cardiovascular;  Laterality: N/A;  ? TEE WITHOUT CARDIOVERSION N/A 04/22/2019  ? Procedure: TRANSESOPHAGEAL ECHOCARDIOGRAM (TEE);  Surgeon: Sherren Mocha, MD;  Location: [facility];  Service: Open Heart Surgery;  Laterality: N/A;  ? TONSILLECTOMY    ? TOTAL KNEE ARTHROPLASTY Left 12/20/2012  ? Procedure: LEFT TOTAL KNEE ARTHROPLASTY;  Surgeon: Mcarthur Rossetti, MD;  Location: [facility];  Service: Orthopedics;  Laterality: Left;  ? TRANSCATHETER AORTIC VALVE REPLACEMENT, TRANSFEMORAL N/A 04/22/2019  ? Procedure: TRANSCATHETER AORTIC VALVE REPLACEMENT, TRANSFEMORAL;  Surgeon: Sherren Mocha, MD;  Location: [facility];  Service: Open Heart Surgery;  Laterality: N/A;  ? ?Social History:  reports that she has never smoked. She has never used smokeless tobacco. She reports that she does not drink alcohol and does not use drugs. ? ?Allergies  ?Allergen Reactions  ? Penicillins Other (See Comments)  ?  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 ?PATIENT HAS HAD A PCN REACTION WITH IMMEDIATE RASH, FACIAL/TONGUE/THROAT SWELLING, SOB, OR LIGHTHEADEDNESS WITH HYPOTENSION:  #  #  #  YES  #  #  #   ?Has patient had a PCN reaction causing severe rash involving mucus membranes or skin necrosis: Unknown ?Has patient had a PCN reaction that required hospitalization: Unknown ?Has patient had a PCN reaction occurring within the last 10 years: Unknown ?  ? Pollen Extract   ?  UNSPECIFIED REACTION   ? Coconut Oil Itching  ?   UNSPECIFIED REACTION   ? Mellaril [Thioridazine] Itching  ? ? ?Family History  ?Problem Relation Age of Onset  ? Coronary artery disease Other   ? Diabetes Other   ? Heart failure Mother   ? Heart failure Father   ? Colon cancer Neg Hx   ? Esophageal cancer Neg Hx   ? Stomach cancer Neg Hx   ? Rectal cancer Neg Hx   ? ? ?Prior to Admission medications   ?Medication Sig Start Date End Date Taking? Authorizing Provider  ?acetaminophen (TYLENOL) 500 MG tablet Take 500 mg by mouth every 6 (six) hours as needed.    [provider]  ?albuterol (VENTOLIN HFA) 108 (90 Base) MCG/ACT inhaler Inhale 1 puff into the lungs 2 (two) times daily as needed for wheezing or shortness of breath.  08/16/18   [provider]  ?alendronate (FOSAMAX) 70 MG tablet Take 70 mg by mouth once a week. 11/23/20   [provider]  ?amLODipine (NORVASC) 10 MG tablet Take 10 mg by mouth daily.    [provider]  ?ascorbic acid (VITAMIN C) 500 MG tablet Take 500 mg by mouth daily.    [provider]  ?aspirin EC 81 MG tablet Take 81 mg  by mouth daily.     [provider]  ?azithromycin (ZITHROMAX) 500 MG tablet Take 1 tablet (500 mg) one hour prior to all dental visits. 04/21/20   Eileen Stanford, PA-C  ?benztropine (COGENTIN) 0.5 MG tablet Take 0.5 mg by mouth 2 (two) times daily.    [provider]  ?beta carotene 25000 UNIT capsule Take 25,000 Units by mouth daily.    [provider]  ?busPIRone (BUSPAR) 10 MG tablet Take 10 mg by mouth 3 (three) times daily.    [provider]  ?calcium-vitamin D (OSCAL WITH D) 500-200 MG-UNIT tablet Take 1 tablet by mouth 3 (three) times daily. 02/25/15   Biagio Borg, MD  ?cetirizine (ZYRTEC) 10 MG tablet TAKE 1 TABLET BY MOUTH DAILY 11/02/15   Biagio Borg, MD  ?diclofenac sodium (VOLTAREN) 1 % GEL Apply 2-4 g topically 4 (four) times daily as needed (Pain).  04/10/18   [provider]  ?diclofenac Sodium (VOLTAREN) 1 %  GEL SMARTSIG:Gram(s) Topical As Directed 07/18/20   [provider]  ?fluticasone (FLONASE) 50 MCG/ACT nasal spray Place 1 spray into both nostrils daily as needed for allergies.  06/05/18   Provider, Histor

## 2021-04-24 DIAGNOSIS — S32591A Other specified fracture of right pubis, initial encounter for closed fracture: Secondary | ICD-10-CM | POA: Diagnosis not present

## 2021-04-24 DIAGNOSIS — S32401A Unspecified fracture of right acetabulum, initial encounter for closed fracture: Secondary | ICD-10-CM | POA: Diagnosis not present

## 2021-04-24 LAB — HEMOGLOBIN A1C
Hgb A1c MFr Bld: 5.5 % (ref 4.8–5.6)
Mean Plasma Glucose: 111.15 mg/dL

## 2021-04-24 LAB — CBC
HCT: 34.6 % — ABNORMAL LOW (ref 36.0–46.0)
Hemoglobin: 11.5 g/dL — ABNORMAL LOW (ref 12.0–15.0)
MCH: 31.6 pg (ref 26.0–34.0)
MCHC: 33.2 g/dL (ref 30.0–36.0)
MCV: 95.1 fL (ref 80.0–100.0)
Platelets: 167 10*3/uL (ref 150–400)
RBC: 3.64 MIL/uL — ABNORMAL LOW (ref 3.87–5.11)
RDW: 15.1 % (ref 11.5–15.5)
WBC: 11.8 10*3/uL — ABNORMAL HIGH (ref 4.0–10.5)
nRBC: 0 % (ref 0.0–0.2)

## 2021-04-24 LAB — BASIC METABOLIC PANEL
Anion gap: 10 (ref 5–15)
BUN: 36 mg/dL — ABNORMAL HIGH (ref 8–23)
CO2: 25 mmol/L (ref 22–32)
Calcium: 8.7 mg/dL — ABNORMAL LOW (ref 8.9–10.3)
Chloride: 105 mmol/L (ref 98–111)
Creatinine, Ser: 0.6 mg/dL (ref 0.44–1.00)
GFR, Estimated: >60 mL/min (ref >60)
Glucose, Bld: 134 mg/dL — ABNORMAL HIGH (ref 70–99)
Potassium: 3.8 mmol/L (ref 3.5–5.1)
Sodium: 140 mmol/L (ref 135–145)

## 2021-04-24 LAB — GLUCOSE, CAPILLARY
Glucose-Capillary: 167 mg/dL — ABNORMAL HIGH (ref 70–99)
Glucose-Capillary: 182 mg/dL — ABNORMAL HIGH (ref 70–99)
Glucose-Capillary: 134 mg/dL — ABNORMAL HIGH (ref 70–99)
Glucose-Capillary: 229 mg/dL — ABNORMAL HIGH (ref 70–99)

## 2021-04-24 MED ORDER — GABAPENTIN 100 MG PO CAPS: 100.0000 mg | ORAL_CAPSULE | ORAL | Status: DC

## 2021-04-24 MED ORDER — HYDROCHLOROTHIAZIDE 12.5 MG PO TABS
12.5000 mg | ORAL_TABLET | Freq: Every morning | ORAL | Status: DC
  Administered 2021-04-25: 12.5 mg via ORAL
  Filled 2021-04-24 (×2): qty 1

## 2021-04-24 MED ORDER — HYDROXYZINE HCL 10 MG PO TABS
10.0000 mg | ORAL_TABLET | Freq: Two times a day (BID) | ORAL | Status: DC | PRN
  Administered 2021-04-25 (×2): 10 mg via ORAL
  Filled 2021-04-24 (×2): qty 1

## 2021-04-24 MED ORDER — CALCIUM CARBONATE-VITAMIN D 500-200 MG-UNIT PO TABS: 1.0000 | ORAL_TABLET | Freq: Three times a day (TID) | ORAL | Status: DC

## 2021-04-24 MED ORDER — ALBUTEROL SULFATE (2.5 MG/3ML) 0.083% IN NEBU: 3.0000 mL | INHALATION_SOLUTION | Freq: Two times a day (BID) | RESPIRATORY_TRACT | Status: DC | PRN

## 2021-04-24 MED ORDER — FLUTICASONE PROPIONATE 50 MCG/ACT NA SUSP
1.0000 | Freq: Every day | NASAL | Status: DC
  Administered 2021-04-24 – 2021-04-25 (×2): 1 via NASAL
  Filled 2021-04-24: qty 16

## 2021-04-24 MED ORDER — BENZTROPINE MESYLATE 0.5 MG PO TABS
0.5000 mg | ORAL_TABLET | Freq: Two times a day (BID) | ORAL | Status: DC
  Administered 2021-04-24 – 2021-04-25 (×4): 0.5 mg via ORAL
  Filled 2021-04-24 (×4): qty 1

## 2021-04-24 MED ORDER — ASCORBIC ACID 500 MG PO TABS
500.0000 mg | ORAL_TABLET | Freq: Every day | ORAL | Status: DC
  Administered 2021-04-24 – 2021-04-25 (×2): 500 mg via ORAL
  Filled 2021-04-24 (×2): qty 1

## 2021-04-24 MED ORDER — OYSTER SHELL CALCIUM/D3 500-5 MG-MCG PO TABS
1.0000 | ORAL_TABLET | Freq: Every day | ORAL | Status: DC
  Administered 2021-04-24 – 2021-04-25 (×2): 1 via ORAL
  Filled 2021-04-24 (×2): qty 1

## 2021-04-24 MED ORDER — GABAPENTIN 100 MG PO CAPS
200.0000 mg | ORAL_CAPSULE | Freq: Every day | ORAL | Status: DC
  Administered 2021-04-24 – 2021-04-25 (×2): 200 mg via ORAL
  Filled 2021-04-24 (×2): qty 2

## 2021-04-24 MED ORDER — ASPIRIN EC 81 MG PO TBEC
81.0000 mg | DELAYED_RELEASE_TABLET | Freq: Every day | ORAL | Status: DC
  Administered 2021-04-24 – 2021-04-25 (×2): 81 mg via ORAL
  Filled 2021-04-24 (×2): qty 1

## 2021-04-24 MED ORDER — FENTANYL CITRATE PF 50 MCG/ML IJ SOSY
12.5000 ug | PREFILLED_SYRINGE | INTRAMUSCULAR | Status: DC | PRN
  Administered 2021-04-24: 12.5 ug via INTRAVENOUS
  Filled 2021-04-24: qty 1

## 2021-04-24 MED ORDER — ROSUVASTATIN CALCIUM 20 MG PO TABS
40.0000 mg | ORAL_TABLET | Freq: Every day | ORAL | Status: DC
Start: 2021-04-24 — End: 2021-04-26
  Administered 2021-04-24 – 2021-04-25 (×2): 40 mg via ORAL
  Filled 2021-04-24 (×2): qty 2

## 2021-04-24 MED ORDER — LORATADINE 10 MG PO TABS
10.0000 mg | ORAL_TABLET | Freq: Every day | ORAL | Status: DC
Start: 2021-04-24 — End: 2021-04-26
  Administered 2021-04-24 – 2021-04-25 (×2): 10 mg via ORAL
  Filled 2021-04-24 (×2): qty 1

## 2021-04-24 MED ORDER — GABAPENTIN 100 MG PO CAPS
100.0000 mg | ORAL_CAPSULE | Freq: Every day | ORAL | Status: DC
  Administered 2021-04-24 – 2021-04-25 (×2): 100 mg via ORAL
  Filled 2021-04-24 (×2): qty 1

## 2021-04-24 MED ORDER — SODIUM CHLORIDE 0.9 % IV SOLN: INTRAVENOUS | Status: DC

## 2021-04-24 NOTE — Progress Notes (Signed)
?PROGRESS NOTE ? ? ? ?Katherine [name]  [id number] DOB: Jan 15, 1946 DOA: 04/23/2021 ?PCP: [name], PA-C  ? ?  ?Brief Narrative:  ?Katherine [name] is a 76 y.o. female with medical history significant of anxiety, depression, atrial fibrillation, CAD, type II DM, diabetic foot ulcer, colon diverticulosis, fibromyalgia, GERD, hyperlipidemia, hypertension, MRSA infection, osteoarthritis, osteoporosis, status post TAVR, unspecified hearing loss who is coming to the emergency department with complaints of having a mechanical/accidental fall at home, while trying to ambulate with her walker, last night around 1800 injuring her right side and developed pain right hip pain. ? ?Hip and pelvic x-ray showed no evidence of hip fractures.  There were chronic pubic bone fractures.CT of the pelvis show acute nondisplaced fractures involving the right superior pubic ramus with extension to involve the weightbearing surface of the acetabulum.  There is an acute nondisplaced fracture involving the right sacral alla with extension to involve the right SI joint.  She has chronic fractures and residual deformity involving the bilateral superior and inferior pubic rami with extension to involve the pubic symphysis.  There was aortic atherosclerosis.  No acute findings on CT head and CT C-spine. ? ?Orthopedic surgery was consulted, no indication for surgical intervention.  ? ?New events last 24 hours / Subjective: ?Patient complains of pelvic pain.  She lives at home alone.  Discussed that she will need pain control, physical therapy and likely discharge to skilled nursing ? ?Assessment & Plan: ?  ?Principal Problem: ?  Pelvic fracture ([place]) ?Active Problems: ?  Hyperlipidemia ?  Essential hypertension ?  GERD ?  Type 2 diabetes mellitus with complication (HCC) ?  Hypocalcemia ?  CAD (coronary artery disease) ?  Aortic atherosclerosis ([place]) ? ? ?Pelvic fracture after fall at home ?-Appreciate orthopedic surgery, no indication  for surgical intervention ?-PT OT.  50% weightbearing on right lower extremity ?-TOC ?-Pain control ?-Follow-up with Dr. Ninfa Linden in office in 2 weeks ? ?Hyperlipidemia ?-Crestor ? ?Hypertension ?-HCTZ  ? ?GERD ?-PPI  ? ?Diabetes mellitus type 2, well controlled ?-A1c 5.5 ?-Sliding scale insulin ? ?CAD ?-Aspirin, Crestor ? ?Leukocytosis ?-Reactive, improved ? ?Tremors ?-Cogentin  ? ? ? ?DVT prophylaxis:  ?enoxaparin (LOVENOX) injection 40 mg Start: 04/23/21 2200 ? ?Code Status: Full code ?Family Communication: No family at bedside ?Disposition Plan:  ?Status is: Observation ?The patient will require care spanning > 2 midnights and should be moved to inpatient because: Pending PT OT evaluation, likely will need SNF placement as patient lives at home alone ? ? ?Antimicrobials:  ?Anti-infectives (From admission, onward)  ? ? None  ? ?  ? ? ? ?Objective: ?Vitals:  ? 04/23/21 2017 04/24/21 0003 04/24/21 0358 04/24/21 0820  ?BP: (!) 151/84 109/77 (!) 149/87 (!) 159/99  ?Pulse: 94 87 88 86  ?Resp: 16 18 18 18   ?Temp: 98 ?F (36.7 ?C) 97.7 ?F (36.5 ?C) 97.6 ?F (36.4 ?C) 98.2 ?F (36.8 ?C)  ?TempSrc: Oral     ?SpO2: 95% 95% 96% 96%  ?Weight:      ?Height:      ? ? ?Intake/Output Summary (Last 24 hours) at 04/24/2021 1155 ?Last data filed at 04/24/2021 0500 ?Gross per 24 hour  ?Intake 240 ml  ?Output 100 ml  ?Net 140 ml  ? ?Filed Weights  ? 04/23/21 1055  ?Weight: 58.5 kg  ? ? ?Examination:  ?General exam: Appears calm and comfortable  ?Respiratory system: Clear to auscultation. Respiratory effort normal. No respiratory distress. No conversational dyspnea.  ?Cardiovascular system: S1 &  S2 heard, RRR. No murmurs. No pedal edema. ?Gastrointestinal system: Abdomen is nondistended, soft and nontender. Normal bowel sounds heard. ?Central nervous system: Alert and oriented. + Right lower facial droop at rest, facial asymmetry.  Patient states that this is chronic and has been this way since birth ?Extremities: Symmetric in appearance   ?Skin: No rashes, lesions or ulcers on exposed skin  ?Psychiatry: Judgement and insight appear normal. Mood & affect appropriate.  ? ?Data Reviewed: I have personally reviewed following labs and imaging studies ? ?CBC: ?Recent Labs  ?Lab 04/23/21 ?1427 04/24/21 ?0739  ?WBC 16.5* 11.8*  ?NEUTROABS 14.9*  --   ?HGB 12.3 11.5*  ?HCT 35.9* 34.6*  ?MCV 92.5 95.1  ?PLT 183 167  ? ?Basic Metabolic Panel: ?Recent Labs  ?Lab 04/23/21 ?1427 04/24/21 ?0739  ?NA 137 140  ?K 3.2* 3.8  ?CL 104 105  ?CO2 25 25  ?GLUCOSE 138* 134*  ?BUN 26* 36*  ?CREATININE 0.45 0.60  ?CALCIUM 8.7* 8.7*  ?MG 1.9  --   ?PHOS 2.7  --   ? ?GFR: ?Estimated Creatinine Clearance: 46.7 mL/min (by C-G formula based on SCr of 0.6 mg/dL). ?Liver Function Tests: ?Recent Labs  ?Lab 04/23/21 ?1427  ?AST 49*  ?ALT 28  ?ALKPHOS 43  ?BILITOT 1.5*  ?PROT 6.7  ?ALBUMIN 4.0  ? ?No results for input(s): LIPASE, AMYLASE in the last 168 hours. ?No results for input(s): AMMONIA in the last 168 hours. ?Coagulation Profile: ?No results for input(s): INR, PROTIME in the last 168 hours. ?Cardiac Enzymes: ?No results for input(s): CKTOTAL, CKMB, CKMBINDEX, TROPONINI in the last 168 hours. ?BNP (last 3 results) ?No results for input(s): PROBNP in the last 8760 hours. ?HbA1C: ?Recent Labs  ?  04/23/21 ?1427  ?HGBA1C 5.5  ? ?CBG: ?Recent Labs  ?Lab 04/23/21 ?2048 04/24/21 ?0842 04/24/21 ?1128  ?GLUCAP 200* 167* 182*  ? ?Lipid Profile: ?No results for input(s): CHOL, HDL, LDLCALC, TRIG, CHOLHDL, LDLDIRECT in the last 72 hours. ?Thyroid Function Tests: ?No results for input(s): TSH, T4TOTAL, FREET4, T3FREE, THYROIDAB in the last 72 hours. ?Anemia Panel: ?No results for input(s): VITAMINB12, FOLATE, FERRITIN, TIBC, IRON, RETICCTPCT in the last 72 hours. ?Sepsis Labs: ?No results for input(s): PROCALCITON, LATICACIDVEN in the last 168 hours. ? ?No results found for this or any previous visit (from the past 240 hour(s)).  ? ? ?Radiology Studies: ?DG Chest 2 View ? ?Result Date:  04/23/2021 ?CLINICAL DATA:  Pre-admission chest radiograph. EXAM: CHEST - 2 VIEW COMPARISON:  Chest radiograph dated 04/18/2019. FINDINGS: The heart size is enlarged. Vascular calcif [id number] [place] EXAM: CT HEAD WITHOUT CONTRAST CT CERVICAL SPINE WITHOUT CONTRAST TECHNIQUE: Multidetector CT imaging of the head and cervical spine was performed following the standard protocol without intravenous contrast. Multiplanar CT image reconstructions of the cervical spine were also generated. RADIATION DOSE REDUCTION: This exam was  performed according to the departmental dose-optimization program which includes automated exposure control, adjustment of the mA and/or kV according to patient size and/or use of iterative reconstruction technique. COMPARISON:  CT 11/17/2016 FINDINGS: CT HEAD FINDINGS Brain: No evidence of acute intracranial hemorrhage or extra-axial collection.No evidence of mass lesion/concerning mass effect.The ventricles are normal in size.Unchanged left basal ganglia lacunar infarcts or prominent perivascular spaces. Vascular: Vascular calcifications.  No hyperdense vessel. Skull: Negative for skull fracture. Mild hyperostosis frontalis internus. Sinuses/Orbits: Mild paranasal sinus mucosal thickening. The orbits are unremarkable. Other:  None. CT CERVICAL SPINE FINDINGS Alignment: Normal. Skull base and vertebrae: There is no acute cervical spine fracture. No aggressive osseous lesion. Soft tissues and spinal canal: No prevertebral fluid or s

## 2021-04-24 NOTE — Evaluation (Signed)
Occupational Therapy Evaluation ?Patient Details ?Name: Katherine [name] ?MRN: [id number] ?DOB: 11-18-1945 ?Today's Date: 04/24/2021 ? ? ?History of Present Illness Katherine [name] is a 76 y.o. female with medical history significant of anxiety, depression, atrial fibrillation, CAD, type II DM, diabetic foot ulcer, colon diverticulosis, fibromyalgia, GERD, hyperlipidemia, hypertension, MRSA infection, osteoarthritis, osteoporosis, status post TAVR, unspecified hearing loss who is coming to the emergency department with complaints of having a mechanical/accidental fall at home, Found to have acute nondisplaced fracture involving the right sacral alla with extension to involve the right SI joint.  ? ?Clinical Impression ?  ?Katherine [name] is a 76 year old woman admitted to hospital with above medical history and presents with generalized weakness, decreased activity tolerance, impaired balance and pain. Patient needing min assist to transfer with walker and increased assistance with ADLs including max-total assist for LB ADLs and toileting. Patient will benefit from skilled OT services while in hospital to improve deficits and learn compensatory strategies as needed in order to return to PLOF.  Recommend short term rehab at discharge. ?  ?   ? ?Recommendations for follow up therapy are one component of a multi-disciplinary discharge planning process, led by the attending physician.  Recommendations may be updated based on patient status, additional functional criteria and insurance authorization.  ? ?Follow Up Recommendations ? Skilled nursing-short term rehab (<3 hours/day)  ?  ?Assistance Recommended at Discharge Frequent or constant Supervision/Assistance  ?Patient can return home with the following A little help with walking and/or transfers;A lot of help with bathing/dressing/bathroom;Assistance with cooking/housework;Direct supervision/assist for medications management;Direct supervision/assist for  financial management ? ?  ?Functional Status Assessment ? Patient has had a recent decline in their functional status and demonstrates the ability to make significant improvements in function in a reasonable and predictable amount of time.  ?Equipment Recommendations ? None recommended by OT  ?  ?Recommendations for Other Services   ? ? ?  ?Precautions / Restrictions Precautions ?Precautions: Fall ?Precaution Comments: pain ?Restrictions ?Weight Bearing Restrictions: Yes ?RLE Weight Bearing: Weight bearing as tolerated ?Other Position/Activity Restrictions: Per [place] 3/26 note patient can be advanced to full weight bearing  ? ?  ? ?Mobility Bed Mobility ?  ?  ?  ?  ?  ?  ?  ?  ?  ? ?Transfers ?  ?  ?  ?  ?  ?  ?  ?  ?  ?  ?  ? ?  ?Balance Overall balance assessment: Needs assistance, History of Falls ?Sitting-balance support: No upper extremity supported ?Sitting balance-Leahy Scale: Fair ?  ?  ?Standing balance support: During functional activity, Reliant on assistive device for balance ?Standing balance-Leahy Scale: Poor ?  ?  ?  ?  ?  ?  ?  ?  ?  ?  ?  ?  ?   ? ?ADL either performed or assessed with clinical judgement  ? ?ADL Overall ADL's : Needs assistance/impaired ?Eating/Feeding: Independent ?  ?Grooming: Set up;Sitting ?  ?Upper Body Bathing: Minimal assistance;Sitting ?  ?Lower Body Bathing: Sitting/lateral leans;Maximal assistance ?  ?Upper Body Dressing : Minimal assistance;Sitting ?  ?Lower Body Dressing: Total assistance;Sit to/from stand ?  ?Toilet Transfer: Minimal assistance;BSC/3in1;Rolling walker (2 wheels) ?  ?Toileting- Clothing Manipulation and Hygiene: Total assistance;Sit to/from stand ?  ?  ?  ?Functional mobility during ADLs: Minimal assistance;Rolling walker (2 wheels) ?   ? ? ? ?Vision Patient Visual Report: No change from baseline ?   ?   ?  Perception   ?  ?Praxis   ?  ? ?Pertinent Vitals/Pain Pain Assessment ?Pain Assessment: 0-10 ?Pain Score: 10-Worst pain ever ?Pain Location: R side  (hip, leg) ?Pain Descriptors / Indicators: Grimacing, Guarding, Sharp ?Pain Intervention(s): Limited activity within patient's tolerance, Premedicated before session  ? ? ? ?Hand Dominance Right ?  ?Extremity/Trunk Assessment Upper Extremity Assessment ?Upper Extremity Assessment: RUE deficits/detail;LUE deficits/detail ?RUE Deficits / Details: WFL ROM and strength, grossly 4/5, hx of shoulder replacement ?RUE Sensation: WNL ?RUE Coordination: WNL ?LUE Deficits / Details: decreased shoulder ROM, reports needing a shoulder replacement, otherwise 4/5 strength ?LUE Sensation: WNL ?LUE Coordination: WNL ?  ?Lower Extremity Assessment ?Lower Extremity Assessment: Defer to PT evaluation ?  ?Cervical / Trunk Assessment ?Cervical / Trunk Assessment: Kyphotic ?  ?Communication Communication ?Communication: No difficulties ?  ?Cognition Arousal/Alertness: Awake/alert ?Behavior During Therapy: [facility] for tasks assessed/performed ?Overall Cognitive Status: Within Functional Limits for tasks assessed ?  ?  ?  ?  ?  ?  ?  ?  ?  ?  ?  ?  ?  ?  ?  ?  ?  ?  ?  ?General Comments    ? ?  ?Exercises   ?  ?Shoulder Instructions    ? ? ?Home Living Family/patient expects to be discharged to:: Private residence ?Living Arrangements: Alone ?Available Help at Discharge: Friend(s);Personal care attendant (3 hrs M-F - getting a new aide) ?Type of Home: Apartment ?Home Access: Stairs to enter ?Entrance Stairs-Number of Steps: 1+1 ?  ?Home Layout: One level ?  ?  ?Bathroom Shower/Tub: Tub/shower unit ?  ?Bathroom Toilet: Standard ?  ?  ?Home Equipment: Rollator (4 wheels);BSC/3in1;Shower seat;Cane - single point;Rolling Walker (2 wheels) ?  ?  ?  ? ?  ?Prior Functioning/Environment Prior Level of Function : Needs assist ?  ?  ?  ?  ?  ?  ?Mobility Comments: uses cane or walker ?ADLs Comments: needs asssitance for bathing, dressing, IADLs ?  ? ?  ?  ?OT Problem List: Decreased activity tolerance;Pain;Decreased knowledge of use of DME or AE;Impaired  balance (sitting and/or standing);Decreased safety awareness ?  ?   ?OT Treatment/Interventions: Self-care/ADL training;Therapeutic exercise;DME and/or AE instruction;Therapeutic activities;Balance training;Patient/family education  ?  ?OT Goals(Current goals can be found in the care plan section) Acute Rehab OT Goals ?Patient Stated Goal: get moving with less pain ?OT Goal Formulation: With patient ?Time For Goal Achievement: 05/08/21 ?Potential to Achieve Goals: Good  ?OT Frequency: Min 2X/week ?  ? ?Co-evaluation PT/OT/SLP Co-Evaluation/Treatment: Yes (co-eval) ?  ?  ?  ?  ? ?  ?AM-PAC OT "6 Clicks" Daily Activity     ?Outcome Measure Help from another person eating meals?: None ?Help from another person taking care of personal grooming?: A Little ?Help from another person toileting, which includes using toliet, bedpan, or urinal?: Total ?Help from another person bathing (including washing, rinsing, drying)?: A Lot ?Help from another person to put on and taking off regular upper body clothing?: A Little ?Help from another person to put on and taking off regular lower body clothing?: Total ?6 Click Score: 14 ?  ?End of Session Equipment Utilized During Treatment: Rolling walker (2 wheels);Gait belt ?Nurse Communication: Mobility status ? ?Activity Tolerance: Patient limited by pain ?Patient left: in chair;with call bell/phone within reach;with chair alarm set ? ?OT Visit Diagnosis: Pain  ?              ?Time: 6387-5643 ?OT Time Calculation (min): 30 min ?Charges:  OT General Charges ?$OT Visit: 1 Visit ?OT Evaluation ?$OT Eval Low Complexity: 1 Low ? ?Anjelique Makar, OTR/L ?Acute Care Rehab Services  ?Office [phone] ?Pager: [phone]  ? ?Nuala Chiles L Nicoli Nardozzi ?04/24/2021, 12:46 PM ?

## 2021-04-24 NOTE — Evaluation (Signed)
Physical Therapy Evaluation ?Patient Details ?Name: Katherine [name] ?MRN: [id number] ?DOB: 02/23/1945 ?Today's Date: 04/24/2021 ? ?History of Present Illness ? Katherine [name] is a 76 y.o. female with medical history significant of anxiety, depression, atrial fibrillation, CAD, type II DM, diabetic foot ulcer, colon diverticulosis, fibromyalgia, GERD, hyperlipidemia, hypertension, MRSA infection, osteoarthritis, osteoporosis, status post TAVR, unspecified hearing loss who is coming to the emergency department with complaints of having a mechanical/accidental fall at home, Found to have acute nondisplaced fracture involving the right sacral alla with extension to involve the right SI joint. ?  ?Clinical Impression ? Patient is a 76 year old female with above HPI. Upon eval, pt presents with generalized weakness, decreased activity tolerance, impaired balance and pain limiting independence with functional mobility. Patient required min assist to perform transfers with use rolling walker and ambulate ~85ft to recliner chair with cues for sequencing and RW management. Patient will benefit from skilled PT services during hospital stay to increase her independence and maximize safety with mobility. Recommend short term rehab upon discharge due to increased assist required for all mobility at this time and limited caregiver support at home. ?    ?   ? ?Recommendations for follow up therapy are one component of a multi-disciplinary discharge planning process, led by the attending physician.  Recommendations may be updated based on patient status, additional functional criteria and insurance authorization. ? ?Follow Up Recommendations Skilled nursing-short term rehab (<3 hours/day) ? ?  ?Assistance Recommended at Discharge Frequent or constant Supervision/Assistance  ?Patient can return home with the following ? A little help with walking and/or transfers;A little help with bathing/dressing/bathroom;Assistance with  cooking/housework;Assist for transportation;Help with stairs or ramp for entrance ? ?  ?Equipment Recommendations    ?Recommendations for Other Services ?    ?  ?Functional Status Assessment Patient has had a recent decline in their functional status and demonstrates the ability to make significant improvements in function in a reasonable and predictable amount of time.  ? ?  ?Precautions / Restrictions Precautions ?Precautions: Fall ?Precaution Comments: pain ?Restrictions ?Weight Bearing Restrictions: Yes ?RLE Weight Bearing: Weight bearing as tolerated ?Other Position/Activity Restrictions: Per Dr. Ninfa Linden 3/26 ortho note patient can be advanced to full weight bearing  ? ?  ? ?Mobility ? Bed Mobility ?Overal bed mobility: Needs Assistance ?Bed Mobility: Supine to Sit ?  ?  ?Supine to sit: Min assist, HOB elevated ?  ?  ?General bed mobility comments: assist for progression of R LE ?  ? ?Transfers ?Overall transfer level: Needs assistance ?Equipment used: Rolling walker (2 wheels) ?Transfers: Sit to/from Stand, Bed to chair/wheelchair/BSC ?Sit to Stand: Min assist ?  ?Step pivot transfers: Min assist ?  ?  ?  ?General transfer comment: x1 from EOB, x2 from [facility]. Able to progress to supervision to stand from [facility] with use of B UEs on armrests. Step pivot to [facility] with MIN A and cues for sequencing and intermittent assist for RW management. ?  ? ?Ambulation/Gait ?Ambulation/Gait assistance: Min assist ?Gait Distance (Feet): 4 Feet ?Assistive device: Rolling walker (2 wheels) ?Gait Pattern/deviations: Step-to pattern, Decreased stride length, Decreased weight shift to right, Decreased stance time - right, Antalgic, Narrow base of support ?Gait velocity: decr ?  ?  ?General Gait Details: facilitation for initial weight shift with forward ambulation to chair in order to progress R LE, pt with impropved initiation following with cues for sequencing of steps and use of UEs to assist with offloading for pain  control. ? ?Stairs ?  ?  ?  ?  ?  ? ?  Wheelchair Mobility ?  ? ?Modified Rankin (Stroke Patients Only) ?  ? ?  ? ?Balance Overall balance assessment: Needs assistance, History of Falls ?Sitting-balance support: No upper extremity supported, Feet supported ?Sitting balance-Leahy Scale: Fair ?  ?  ?Standing balance support: During functional activity, Reliant on assistive device for balance, Bilateral upper extremity supported ?Standing balance-Leahy Scale: Poor ?  ?  ?  ?  ?  ?  ?  ?  ?  ?  ?  ?  ?   ? ? ? ?Pertinent Vitals/Pain Pain Assessment ?Pain Assessment: 0-10 ?Pain Score: 10-Worst pain ever ?Pain Location: R side (hip, leg) ?Pain Descriptors / Indicators: Grimacing, Guarding, Sharp ?Pain Intervention(s): Limited activity within patient's tolerance, Premedicated before session  ? ? ?Home Living Family/patient expects to be discharged to:: Private residence ?Living Arrangements: Alone ?Available Help at Discharge: Friend(s);Personal care attendant (3 hrs M-F - getting a new aide) ?Type of Home: Apartment ?Home Access: Stairs to enter ?  ?Entrance Stairs-Number of Steps: 1+1 ?  ?Home Layout: One level ?Home Equipment: Rollator (4 wheels);BSC/3in1;Shower seat;Cane - single point;Rolling Walker (2 wheels) ?   ?  ?Prior Function Prior Level of Function : Needs assist ?  ?  ?  ?  ?  ?  ?Mobility Comments: uses cane or walker ?ADLs Comments: needs asssitance for bathing, dressing, IADLs ?  ? ? ?Hand Dominance  ? Dominant Hand: Right ? ?  ?Extremity/Trunk Assessment  ? Upper Extremity Assessment ?Upper Extremity Assessment: Defer to OT evaluation ?RUE Deficits / Details: WFL ROM and strength, grossly 4/5, hx of shoulder replacement ?RUE Sensation: WNL ?RUE Coordination: WNL ?LUE Deficits / Details: decreased shoulder ROM, reports needing a shoulder replacement, otherwise 4/5 strength ?LUE Sensation: WNL ?LUE Coordination: WNL ?  ? ?Lower Extremity Assessment ?Lower Extremity Assessment: RLE deficits/detail;LLE  deficits/detail ?RLE Deficits / Details: DF/PF 4+/5 . able to perform AROM LAQ, hip flexion in seated position. Unable to appropriately MMT due to increased pain ?RLE: Unable to fully assess due to pain ?LLE Deficits / Details: grossly 4+/5 throughout ?  ? ?Cervical / Trunk Assessment ?Cervical / Trunk Assessment: Kyphotic  ?Communication  ? Communication: No difficulties  ?Cognition Arousal/Alertness: Awake/alert ?Behavior During Therapy: [facility] for tasks assessed/performed ?Overall Cognitive Status: Within Functional Limits for tasks assessed ?  ?  ?  ?  ?  ?  ?  ?  ?  ?  ?  ?  ?  ?  ?  ?  ?  ?  ?  ? ?  ?General Comments   ? ?  ?Exercises    ? ?Assessment/Plan  ?  ?PT Assessment Patient needs continued PT services  ?PT Problem List Decreased strength;Decreased activity tolerance;Decreased balance;Decreased coordination;Pain ? ?   ?  ?PT Treatment Interventions DME instruction;Gait training;Stair training;Functional mobility training;Therapeutic activities;Therapeutic exercise;Balance training;Patient/family education   ? ?PT Goals (Current goals can be found in the Care Plan section)  ?Acute Rehab PT Goals ?Patient Stated Goal: have less pain and regain independence ?PT Goal Formulation: With patient ?Time For Goal Achievement: 05/08/21 ?Potential to Achieve Goals: Good ? ?  ?Frequency Min 2X/week ?  ? ? ?Co-evaluation   ?  ?  ?  ?  ? ? ?  ?AM-PAC PT "6 Clicks" Mobility  ?Outcome Measure Help needed turning from your back to your side while in a flat bed without using bedrails?: A Little ?Help needed moving from lying on your back to sitting on the side of a flat bed without using  bedrails?: A Little ?Help needed moving to and from a bed to a chair (including a wheelchair)?: A Little ?Help needed standing up from a chair using your arms (e.g., wheelchair or bedside chair)?: A Little ?Help needed to walk in hospital room?: A Little ?Help needed climbing 3-5 steps with a railing? : A Lot ?6 Click Score: 17 ? ?  ?End of  Session Equipment Utilized During Treatment: Gait belt ?Activity Tolerance: Patient tolerated treatment well;Patient limited by pain ?Patient left: in chair;with call bell/phone within reach;with chair alarm set ?Nurse Communication:

## 2021-04-24 NOTE — Consult Note (Signed)
Reason for Consult:  Right-sided pelvic fractures ?Referring Physician: EDP ? ?Katherine [name] is an 76 y.o. female.  ?HPI: The patient is a 76 year old female who lives alone.  She does ambulate with a rolling walker.  I have seen her remotely and did replace her left knee years ago.  She sustained a mechanical fall yesterday when she tripped against her couch.  She landed hard on the right side.  [facility] room with right hip pain and inability to bear much weight.  X-rays and CT scan showed some nondisplaced pelvic fractures on the right side.  She is admitted to the medicine service for pain control, therapy and disposition in terms of where she may need to be after this acute hospitalization since she does live alone.  She does report some right hip pain with weightbearing.  She denies any other injuries from her fall. ? ?Past Medical History:  ?Diagnosis Date  ? ANXIETY 07/31/2007  ? Atrial fibrillation (HCC)   ? a. per structural notes, previous hx remote Afib.  ? CAD (coronary artery disease)   ? a. 03/2019 orbital atherectomy/PCI with DES to RCA, prox Cx, and mid Cx.  ? DEPRESSION 09/05/2006  ? DIABETES MELLITUS, TYPE II   ? Diabetic foot ulcer (HCC) 10/26/2014  ? DIVERTICULOSIS, COLON 09/05/2006  ? FIBROMYALGIA 05/10/2009  ? GERD 09/05/2006  ? HYPERLIPIDEMIA 09/05/2006  ? HYPERTENSION 09/05/2006  ? MRSA infection 10/26/2014  ? OSTEOARTHRITIS 09/05/2006  ? OSTEOPOROSIS 07/31/2007  ? 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 s/p TAVR with a 26 mm Edwards S3U via the TF approach by Drs Excell Seltzerooper and Cornelius Moraswen  ? Severe aortic stenosis   ? s/p TAVR  ? Unspecified hearing loss 10/09/2007  ? ? ?Past Surgical History:  ?Procedure Laterality Date  ? ABDOMINAL HYSTERECTOMY  12/08  ? Bladder tac, partial hysterectomy  ? BREAST BIOPSY    ? CARDIAC CATHETERIZATION    ? catarct removal    ? both eyes  ? COLONOSCOPY    ? CORONARY ATHERECTOMY N/A 04/15/2019  ? Procedure: CORONARY ATHERECTOMY;   Surgeon: Tonny Bollmanooper, Michael, MD;  [facility];  Service: Cardiovascular;  Laterality: N/A;  ? CORONARY STENT INTERVENTION N/A 04/15/2019  ? Procedure: CORONARY STENT INTERVENTION;  Surgeon: Tonny Bollmanooper, Michael, MD;  [facility];  Service: Cardiovascular;  Laterality: N/A;  ? I & D KNEE WITH POLY EXCHANGE Left 01/03/2013  ? Procedure: IRRIGATION AND DEBRIDEMENT LEFT KNEE WITH POLY EXCHANGE;  Surgeon: Kathryne Hitchhristopher Y Prisha Hiley, MD;  Location: [facility] ORS;  Service: Orthopedics;  Laterality: Left;  ? inguinal herniorrhapy    ? right Dr. Freida BusmanAllen 06/2009  ? REVERSE SHOULDER ARTHROPLASTY Right 09/07/2016  ? REVERSE SHOULDER ARTHROPLASTY Right 09/07/2016  ? Procedure: REVERSE SHOULDER ARTHROPLASTY;  Surgeon: Jones Broomhandler, Justin, MD;  [facility];  Service: Orthopedics;  Laterality: Right;  Right reverse shoulder arthroplasty  ? RIGHT/LEFT HEART CATH AND CORONARY ANGIOGRAPHY N/A 09/11/2018  ? Procedure: RIGHT/LEFT HEART CATH AND CORONARY ANGIOGRAPHY;  Surgeon: Tonny Bollmanooper, Michael, MD;  [facility];  Service: Cardiovascular;  Laterality: N/A;  ? TEE WITHOUT CARDIOVERSION N/A 04/22/2019  ? Procedure: TRANSESOPHAGEAL ECHOCARDIOGRAM (TEE);  Surgeon: Tonny Bollmanooper, Michael, MD;  [facility];  Service: Open Heart Surgery;  Laterality: N/A;  ? TONSILLECTOMY    ? TOTAL KNEE ARTHROPLASTY Left 12/20/2012  ? Procedure: LEFT TOTAL KNEE ARTHROPLASTY;  Surgeon: Kathryne Hitchhristopher Y Bernell Haynie, MD;  Location: [facility] ORS;  Service:  Orthopedics;  Laterality: Left;  ? TRANSCATHETER AORTIC VALVE REPLACEMENT, TRANSFEMORAL N/A 04/22/2019  ? Procedure: TRANSCATHETER AORTIC VALVE REPLACEMENT, TRANSFEMORAL;  Surgeon: [name], MD;  Location: [facility] INVASIVE CV LAB;  Service: Open Heart Surgery;  Laterality: N/A;  ? ? ?Family History  ?Problem Relation Age of Onset  ? Coronary artery disease Other   ? Diabetes Other   ? Heart failure Mother   ? Heart failure Father   ? Colon cancer Neg Hx   ? Esophageal cancer Neg Hx   ?  Stomach cancer Neg Hx   ? Rectal cancer Neg Hx   ? ? ?Social History:  reports that she has never smoked. She has never used smokeless tobacco. She reports that she does not drink alcohol and does not use drugs. ? ?Allergies:  ?Allergies  ?Allergen Reactions  ? Penicillins Other (See Comments)  ?  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 ?PATIENT HAS HAD A PCN REACTION WITH IMMEDIATE RASH, FACIAL/TONGUE/THROAT SWELLING, SOB, OR LIGHTHEADEDNESS WITH HYPOTENSION:  #  #  #  YES  #  #  #   ?Has patient had a PCN reaction causing severe rash involving mucus membranes or skin necrosis: Unknown ?Has patient had a PCN reaction that required hospitalization: Unknown ?Has patient had a PCN reaction occurring within the last 10 years: Unknown ?  ? Pollen Extract Other (See Comments)  ?  UNSPECIFIED REACTION, but allergic   ? Coconut Oil Itching  ? Latex Rash  ? Mellaril [Thioridazine] Itching  ? ? ?Medications: I have reviewed the patient's current medications. ? ?Results for orders placed or performed during the hospital encounter of 04/23/21 (from the past 48 hour(s))  ?CBC with Differential     Status: Abnormal  ? Collection Time: 04/23/21  2:27 PM  ?Result Value Ref Range  ? WBC 16.5 (H) 4.0 - 10.5 K/uL  ? RBC 3.88 3.87 - 5.11 MIL/uL  ? Hemoglobin 12.3 12.0 - 15.0 g/dL  ? HCT 35.9 (L) 36.0 - 46.0 %  ? MCV 92.5 80.0 - 100.0 fL  ? MCH 31.7 26.0 - 34.0 pg  ? MCHC 34.3 30.0 - 36.0 g/dL  ? RDW 14.6 11.5 - 15.5 %  ? Platelets 183 150 - 400 K/uL  ? nRBC 0.0 0.0 - 0.2 %  ? Neutrophils Relative % 90 %  ? Neutro Abs 14.9 (H) 1.7 - 7.7 K/uL  ? Lymphocytes Relative 5 %  ? Lymphs Abs 0.7 0.7 - 4.0 K/uL  ? Monocytes Relative 5 %  ? Monocytes Absolute 0.8 0.1 - 1.0 K/uL  ? Eosinophils Relative 0 %  ? Eosinophils Absolute 0.0 0.0 - 0.5 K/uL  ? Basophils Relative 0 %  ? Basophils Absolute 0.0 0.0 - 0.1 K/uL  ? Immature Granulocytes 0 %  ? Abs Immature Granulocytes 0.07 0.00 - 0.07 K/uL  ?  Comment: Performed at [facility],  [address] [place], [place] [address]  ?Comprehensive metabolic panel     Status: Abnormal  ? Collection Time: 04/23/21  2:27 PM  ?Result Value Ref Range  ? Sodium 137 135 - 145 mmol/L  ? Potassium 3.2 (L) 3.5 - 5.1 mmol/L  ? Chloride 104 98 - 111 mmol/L  ? CO2 25 22 - 32 mmol/L  ? Glucose, Bld 138 (H) 70 - 99 mg/dL  ?  Comment: Glucose reference range applies only to samples taken after fasting for at least 8 hours.  ? BUN 26 (H) 8 - 23 mg/dL  ? Creatinine, Ser  0.45 0.44 - 1.00 mg/dL  ? Calcium 8.7 (L) 8.9 - 10.3 mg/dL  ? Total Protein 6.7 6.5 - 8.1 g/dL  ? Albumin 4.0 3.5 - 5.0 g/dL  ? AST 49 (H) 15 - 41 U/L  ? ALT 28 0 - 44 U/L  ? Alkaline Phosphatase 43 38 - 126 U/L  ? Total Bilirubin 1.5 (H) 0.3 - 1.2 mg/dL  ? GFR, Estimated >60 >60 mL/min  ?  Comment: (NOTE) ?Calculated using the CKD-EPI Creatinine Equation (2021) ?  ? Anion gap 8 5 - 15  ?  Comment: Performed at [facility], [address], [place], [place] [address]  ?Magnesium     Status: None  ? Collection Time: 04/23/21  2:27 PM  ?Result Value Ref Range  ? Magnesium 1.9 1.7 - 2.4 mg/dL  ?  Comment: Performed at [facility], [address], [place], [place] [address]  ?Phosphorus     Status: None  ? Collection Time: 04/23/21  2:27 PM  ?Result Value Ref Range  ? Phosphorus 2.7 2.5 - 4.6 mg/dL  ?  Comment: Performed at [facility], [address] [place], [place] [address]  ?Glucose, capillary     Status: Abnormal  ? Collection Time: 04/23/21  8:48 PM  ?Result Value Ref Range  ? Glucose-Capillary 200 (H) 70 - 99 mg/dL  ?  Comment: Glucose reference range applies only to samples taken after fasting for at least 8 hours.  ?CBC     Status: Abnormal  ? Collection Time: 04/24/21  7:39 AM  ?Result Value Ref Range  ? WBC 11.8 (H) 4.0 - 10.5 K/uL  ? RBC 3.64 (L) 3.87 - 5.11 MIL/uL  ? Hemoglobin 11.5 (L) 12.0 - 15.0 g/dL  ? HCT 34.6 (L) 36.0 - 46.0 %  ? MCV 95.1 80.0 - 100.0 fL  ? MCH 31.6 26.0 - 34.0  pg  ? MCHC 33.2 30.0 - 36.0 g/dL  ? RDW 15.1 11.5 - 15.5 %  ? Platelets 167 150 - 400 K/uL  ? nRBC 0.0 0.0 - 0.2 %  ?  Comment: Performed at [facility], [address] [place]

## 2021-04-25 DIAGNOSIS — S32401A Unspecified fracture of right acetabulum, initial encounter for closed fracture: Secondary | ICD-10-CM | POA: Diagnosis not present

## 2021-04-25 DIAGNOSIS — S32591A Other specified fracture of right pubis, initial encounter for closed fracture: Secondary | ICD-10-CM | POA: Diagnosis not present

## 2021-04-25 LAB — CBC
HCT: 30.7 % — ABNORMAL LOW (ref 36.0–46.0)
Hemoglobin: 9.9 g/dL — ABNORMAL LOW (ref 12.0–15.0)
MCH: 31.8 pg (ref 26.0–34.0)
MCHC: 32.2 g/dL (ref 30.0–36.0)
MCV: 98.7 fL (ref 80.0–100.0)
Platelets: 165 10*3/uL (ref 150–400)
RBC: 3.11 MIL/uL — ABNORMAL LOW (ref 3.87–5.11)
RDW: 15.6 % — ABNORMAL HIGH (ref 11.5–15.5)
WBC: 8.1 10*3/uL (ref 4.0–10.5)
nRBC: 0 % (ref 0.0–0.2)

## 2021-04-25 LAB — GLUCOSE, CAPILLARY
Glucose-Capillary: 88 mg/dL (ref 70–99)
Glucose-Capillary: 247 mg/dL — ABNORMAL HIGH (ref 70–99)
Glucose-Capillary: 176 mg/dL — ABNORMAL HIGH (ref 70–99)
Glucose-Capillary: 194 mg/dL — ABNORMAL HIGH (ref 70–99)

## 2021-04-25 MED ORDER — POLYETHYLENE GLYCOL 3350 17 G PO PACK
17.0000 g | PACK | Freq: Every day | ORAL | Status: DC | PRN
  Administered 2021-04-25: 17 g via ORAL
  Filled 2021-04-25: qty 1

## 2021-04-25 MED ORDER — OXYCODONE HCL 5 MG PO TABS: 5.0000 mg | ORAL_TABLET | ORAL | 0 refills | Status: DC | PRN

## 2021-04-25 MED ORDER — SENNOSIDES-DOCUSATE SODIUM 8.6-50 MG PO TABS
1.0000 | ORAL_TABLET | Freq: Every evening | ORAL | Status: DC | PRN
  Administered 2021-04-25: 1 via ORAL
  Filled 2021-04-25: qty 1

## 2021-04-25 MED ORDER — INSULIN ASPART 100 UNIT/ML IJ SOLN
0.0000 [IU] | Freq: Three times a day (TID) | INTRAMUSCULAR | Status: DC
  Administered 2021-04-25: 5 [IU] via SUBCUTANEOUS
  Administered 2021-04-25: 3 [IU] via SUBCUTANEOUS

## 2021-04-25 MED ORDER — INSULIN ASPART 100 UNIT/ML IJ SOLN: 0.0000 [IU] | Freq: Three times a day (TID) | INTRAMUSCULAR | Status: DC

## 2021-04-25 NOTE — Discharge Summary (Signed)
Physician Discharge Summary  ?Katherine [name] BJS:[id number] DOB: [birth year] DOA: 04/23/2021 ? ?PCP: Coralee Rud, PA-C ? ?Admit date: 04/23/2021 ?Discharge date: 04/25/2021 ? ?Admitted From: Home ?Disposition:  SNF ? ?Recommendations for Outpatient Follow-up:  ?Follow up with Orthopedic surgery Dr. Magnus Ivan in 2 weeks  ? ?Discharge Condition: Stable ?CODE STATUS: Full  ?Diet recommendation:  ?Diet Orders (From admission, onward)  ? ?  Start     Ordered  ? 04/23/21 1646  Diet heart healthy/carb modified Room service appropriate? Yes; Fluid consistency: Thin  Diet effective now       ?Question Answer Comment  ?Diet-HS Snack? Nothing   ?Room service appropriate? Yes   ?Fluid consistency: Thin   ?  ? 04/23/21 1646  ? ?  ?  ? ?  ? ?Brief/Interim Summary: ?Katherine [name] is a 76 y.o. female with medical history significant of anxiety, depression, atrial fibrillation, CAD, type II DM, diabetic foot ulcer, colon diverticulosis, fibromyalgia, GERD, hyperlipidemia, hypertension, MRSA infection, osteoarthritis, osteoporosis, status post TAVR, unspecified hearing loss who is coming to the emergency department with complaints of having a mechanical/accidental fall at home, while trying to ambulate with her walker, last night around 1800 injuring her right side and developed pain right hip pain. ?  ?Hip and pelvic x-ray showed no evidence of hip fractures.  There were chronic pubic bone fractures.CT of the pelvis show acute nondisplaced fractures involving the right superior pubic ramus with extension to involve the weightbearing surface of the acetabulum.  There is an acute nondisplaced fracture involving the right sacral alla with extension to involve the right SI joint.  She has chronic fractures and residual deformity involving the bilateral superior and inferior pubic rami with extension to involve the pubic symphysis.  There was aortic atherosclerosis.  No acute findings on CT head and CT C-spine. ?  ?Orthopedic surgery  was consulted, no indication for surgical intervention. PT OT recommended SNF placement.  ? ?Discharge Diagnoses:  ?Principal Problem: ?  Pelvic fracture (HCC) ?Active Problems: ?  Hyperlipidemia ?  Essential hypertension ?  GERD ?  Type 2 diabetes mellitus with complication (HCC) ?  Hypocalcemia ?  CAD (coronary artery disease) ?  Aortic atherosclerosis (HCC) ? ? ?Pelvic fracture after fall at home ?-Appreciate orthopedic surgery, no indication for surgical intervention ?-PT OT.  50% weightbearing on right lower extremity ?-TOC follow-up for SNF placement ?-Pain control ?-Follow-up with Dr. Magnus Ivan in office in 2 weeks ?  ?Decreased urine output ?-Patient was started on IV fluid during hospital stay.  Continue to monitor, had last night, 2x UOP occurrences today.  ?-Improved  ? ?Hyperlipidemia ?-Crestor ? ?Hypertension ?-HCTZ  ? ?GERD ?-PPI  ? ?Diabetes mellitus type 2, well controlled ?-A1c 5.5 ? ?CAD ?-Aspirin, Crestor ?  ?Leukocytosis ?-Resolved ?  ?Tremors ?-Cogentin  ? ?Discharge Instructions ? ?Discharge Instructions   ? ? Increase activity slowly   Complete by: As directed ?  ? ?  ? ?Allergies as of 04/25/2021   ? ?   Reactions  ? Penicillins Other (See Comments)  ? 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 ?PATIENT HAS HAD A PCN REACTION WITH IMMEDIATE RASH, FACIAL/TONGUE/THROAT SWELLING, SOB, OR LIGHTHEADEDNESS WITH HYPOTENSION:  #  #  #  YES  #  #  #   ?Has patient had a PCN reaction causing severe rash involving mucus membranes or skin necrosis: Unknown ?Has patient had a PCN reaction that required hospitalization: Unknown ?Has patient had a PCN reaction occurring within the last 10  years: Unknown  ? Pollen Extract Other (See Comments)  ? UNSPECIFIED REACTION, but allergic   ? Coconut Oil Itching  ? Latex Rash  ? Mellaril [thioridazine] Itching  ? ?  ? ?  ?Medication List  ?  ? ?TAKE these medications   ? ?acetaminophen 500 MG tablet ?Commonly known as: TYLENOL ?Take 500 mg by mouth every 6 (six) hours  as needed for mild pain or headache. ?  ?albuterol 108 (90 Base) MCG/ACT inhaler ?Commonly known as: VENTOLIN HFA ?Inhale 2 puffs into the lungs 2 (two) times daily as needed for wheezing or shortness of breath. ?  ?alendronate 70 MG tablet ?Commonly known as: FOSAMAX ?Take 70 mg by mouth once a week. ?  ?ascorbic acid 500 MG tablet ?Commonly known as: VITAMIN C ?Take 500 mg by mouth daily. ?  ?aspirin EC 81 MG tablet ?Take 81 mg by mouth daily. ?  ?azithromycin 500 MG tablet ?Commonly known as: Zithromax ?Take 1 tablet (500 mg) one hour prior to all dental visits. ?What changed:  ?how much to take ?how to take this ?when to take this ?additional instructions ?  ?benztropine 0.5 MG tablet ?Commonly known as: COGENTIN ?Take 0.5 mg by mouth 2 (two) times daily. ?  ?beta carotene [id number] UNIT capsule ?Take 25,000 Units by mouth daily. ?  ?calcium-vitamin D 500-200 MG-UNIT tablet ?Commonly known as: OSCAL WITH D ?Take 1 tablet by mouth 3 (three) times daily. ?What changed: when to take this ?  ?cetirizine 10 MG tablet ?Commonly known as: ZYRTEC ?TAKE 1 TABLET BY MOUTH DAILY ?  ?diclofenac sodium 1 % Gel ?Commonly known as: VOLTAREN ?Apply 2-4 g topically 4 (four) times daily as needed (for pain). ?  ?fluticasone 50 MCG/ACT nasal spray ?Commonly known as: FLONASE ?Place 1 spray into both nostrils daily. ?  ?gabapentin 100 MG capsule ?Commonly known as: NEURONTIN ?Take 100-200 mg by mouth See admin instructions. Take 200 mg by mouth in the morning and 100 mg at bedtime ?  ?hydrochlorothiazide 12.5 MG capsule ?Commonly known as: MICROZIDE ?Take 12.5 mg by mouth in the morning. ?  ?hydrOXYzine 10 MG tablet ?Commonly known as: ATARAX ?Take 1 tablet (10 mg total) by mouth 2 (two) times daily as needed. ?What changed: when to take this ?  ?ipratropium 0.06 % nasal spray ?Commonly known as: ATROVENT ?USE 1 SPRAY IN EACH NOSTRIL THREE TIMES DAILY ?What changed: See the new instructions. ?  ?metFORMIN 500 MG tablet ?Commonly known  as: GLUCOPHAGE ?Take 500 mg by mouth in the morning and at bedtime. ?  ?multivitamin with minerals Tabs tablet ?Take 1 tablet by mouth daily. ?  ?Namzaric 28-10 MG Cp24 ?Generic drug: Memantine HCl-Donepezil HCl ?Take 1 capsule by mouth daily. ?  ?nitroGLYCERIN 0.4 MG SL tablet ?Commonly known as: NITROSTAT ?DISSOLVE 1 TABLET UNDER THE TONGUE EVERY 5 MINUTES AS NEEDED FOR CHEST PAIN AS DIRECTED ?What changed: See the new instructions. ?  ?Omega-3 1000 MG Caps ?Take 1,000 mg by mouth every Monday, Wednesday, and Friday. ?  ?OneTouch Delica Lancets 33G Misc ?CHECK BLOOD SUGAR ONCE DAILY AS DIRECTED ?  ?OneTouch Verio test strip ?Generic drug: glucose blood ?SMARTSIG:1 Each Via Meter Twice Daily ?  ?oxyCODONE 5 MG immediate release tablet ?Commonly known as: Oxy IR/ROXICODONE ?Take 1 tablet (5 mg total) by mouth every 4 (four) hours as needed for moderate pain. ?  ?pantoprazole 40 MG tablet ?Commonly known as: PROTONIX ?TAKE 1 TABLET BY MOUTH DAILY ?What changed: when to take this ?  ?  Potassium Chloride ER 20 MEQ Tbcr ?Take 20 mEq by mouth in the morning and at bedtime. ?  ?risperiDONE 3 MG tablet ?Commonly known as: RISPERDAL ?Take 3 mg by mouth at bedtime. ?  ?rosuvastatin 40 MG tablet ?Commonly known as: CRESTOR ?TAKE 1 TABLET(40 MG) BY MOUTH DAILY AT 6 PM ?What changed: See the new instructions. ?  ?sodium chloride 0.65 % Soln nasal spray ?Commonly known as: OCEAN ?Place 1 spray into both nostrils as needed for congestion. ?  ?Urea 20 Intensive Hydrating 20 % cream ?Generic drug: urea ?Apply 1 application. topically daily as needed (for dry skin). ?  ? ?  ? ? Follow-up Information   ? ? Kathryne Hitch, MD. Schedule an appointment as soon as possible for a visit in 2 week(s).   ?Specialty: Orthopedic Surgery ?Contact information: ?[address] ?[place] [address] ?[phone] ? ? ?  ?  ? ?  ?  ? ?  ? ?Allergies  ?Allergen Reactions  ? Penicillins Other (See Comments)  ?  WORSENS HER TREMORS FROM  FIBROMYALGIA ?PATIENT HAS HAD A PCN REACTION WITH IMMEDIATE RASH, FACIAL/TONGUE/THROAT SWELLING, SOB, OR LIGHTHEADEDNESS WITH HYPOTENSION:  #  #  #  YES  #  #  #   ?Has patient had a PCN reaction causing

## 2021-04-25 NOTE — TOC Transition Note (Signed)
Transition of Care (TOC) - CM/SW Discharge Note ? ? ?Patient Details  ?Name: Katherine [name] ?MRN: [id number] ?Date of Birth: Nov 19, 1945 ? ?Transition of Care (TOC) CM/SW Contact:  ?Vonzell Lindblad Aris Lot, LCSW ?Phone Number: ?04/25/2021, 2:43 PM ? ? ?Clinical Narrative:    ? ?SNF auth approved 3/27 -- 3/29. Ref# [id number] ? ?Patient will DC to: [facility] ?Anticipated DC date: 04/25/21 ?Family notified: Harley,Faye (Friend)  ?[phone] [facility]) ?Transport by: Sharin Mons ? ? ?Per MD patient ready for DC to [facility]. RN, patient, patient's family, and facility notified of DC. Discharge Summary and FL2 sent to facility. RN to call report prior to discharge [phone] Room 126). DC packet on chart. Ambulance transport requested for patient.  ? ?CSW will sign off for now as social work intervention is no longer needed. Please consult [place] again if new needs arise.  ? ?Final next level of care: Skilled Nursing Facility ?Barriers to Discharge: No Barriers Identified ? ? ?Patient Goals and CMS Choice ?Patient states their goals for this hospitalization and ongoing recovery are:: Rehab then return home ?  ?  ? ?Discharge Placement ?  ?           ?Patient chooses bed at: [facility] ?Patient to be transferred to facility by: [facility] ?Name of family member notified: [name] (Friend)   [phone] [facility]) ?Patient and family notified of of transfer: 04/25/21 ? ?Discharge Plan and Services ?  ?  ?           ?  ?  ?  ?  ?  ?  ?  ?  ?  ?  ? ?Social Determinants of Health (SDOH) Interventions ?  ? ? ?Readmission Risk Interventions ?   ? View : No data to display.  ?  ?  ?  ? ? ? ? ? ?

## 2021-04-25 NOTE — Progress Notes (Signed)
?PROGRESS NOTE ? ? ? ?Katherine [name]  [id number] DOB: 12-25-1945 DOA: 04/23/2021 ?PCP: [name], PA-C  ? ?  ?Brief Narrative:  ?Katherine [name] is a 76 y.o. female with medical history significant of anxiety, depression, atrial fibrillation, CAD, type II DM, diabetic foot ulcer, colon diverticulosis, fibromyalgia, GERD, hyperlipidemia, hypertension, MRSA infection, osteoarthritis, osteoporosis, status post TAVR, unspecified hearing loss who is coming to the emergency department with complaints of having a mechanical/accidental fall at home, while trying to ambulate with her walker, last night around 1800 injuring her right side and developed pain right hip pain. ? ?Hip and pelvic x-ray showed no evidence of hip fractures.  There were chronic pubic bone fractures.CT of the pelvis show acute nondisplaced fractures involving the right superior pubic ramus with extension to involve the weightbearing surface of the acetabulum.  There is an acute nondisplaced fracture involving the right sacral alla with extension to involve the right SI joint.  She has chronic fractures and residual deformity involving the bilateral superior and inferior pubic rami with extension to involve the pubic symphysis.  There was aortic atherosclerosis.  No acute findings on CT head and CT C-spine. ? ?Orthopedic surgery was consulted, no indication for surgical intervention.  ? ?New events last 24 hours / Subjective: ?Patient sitting in recliner today.  Complains of some mild pain.  She was started on IV fluids last night due to decreased oral intake and decreased urine output. ? ?Assessment & Plan: ?  ?Principal Problem: ?  Pelvic fracture ([place]) ?Active Problems: ?  Hyperlipidemia ?  Essential hypertension ?  GERD ?  Type 2 diabetes mellitus with complication (HCC) ?  Hypocalcemia ?  CAD (coronary artery disease) ?  Aortic atherosclerosis ([place]) ? ? ?Pelvic fracture after fall at home ?-Appreciate orthopedic surgery, no indication  for surgical intervention ?-PT OT.  50% weightbearing on right lower extremity ?-TOC follow-up for SNF placement ?-Pain control ?-Follow-up with Dr. Ninfa Linden in office in 2 weeks ? ?Decreased urine output ?-Patient was started on IV fluid yesterday.  Continue to monitor ? ?Hyperlipidemia ?-Crestor ? ?Hypertension ?-HCTZ  ? ?GERD ?-PPI  ? ?Diabetes mellitus type 2, well controlled ?-A1c 5.5 ?-Sliding scale insulin ? ?CAD ?-Aspirin, Crestor ? ?Leukocytosis ?-Resolved ? ?Tremors ?-Cogentin  ? ? ? ?DVT prophylaxis:  ?enoxaparin (LOVENOX) injection 40 mg Start: 04/23/21 2200 ? ?Code Status: Full code ?Family Communication: No family at bedside ?Disposition Plan:  ?Status is: Observation ?The patient will require care spanning > 2 midnights and should be moved to inpatient because: Remains on IV fluid due to decreased urine output.  Continue to monitor.  SNF placement is pending ? ? ?Antimicrobials:  ?Anti-infectives (From admission, onward)  ? ? None  ? ?  ? ? ? ?Objective: ?Vitals:  ? 04/24/21 0358 04/24/21 0820 04/24/21 2025 04/25/21 0459  ?BP: (!) 149/87 (!) 159/99 132/83 106/77  ?Pulse: 88 86 84 70  ?Resp: 18 18 16 15   ?Temp: 97.6 ?F (36.4 ?C) 98.2 ?F (36.8 ?C) 99 ?F (37.2 ?C) 97.7 ?F (36.5 ?C)  ?TempSrc:   Oral Oral  ?SpO2: 96% 96% 94% 97%  ?Weight:      ?Height:      ? ? ?Intake/Output Summary (Last 24 hours) at 04/25/2021 1113 ?Last data filed at 04/25/2021 1052 ?Gross per 24 hour  ?Intake 1763.08 ml  ?Output 700 ml  ?Net 1063.08 ml  ? ? ?Filed Weights  ? 04/23/21 1055  ?Weight: 58.5 kg  ? ? ?Examination:  ?General exam:  Appears calm and comfortable  ?Respiratory system: Clear to auscultation. Respiratory effort normal. No respiratory distress. No conversational dyspnea.  ?Cardiovascular system: S1 & S2 heard, RRR. No murmurs. No pedal edema. ?Gastrointestinal system: Abdomen is nondistended, soft and nontender. Normal bowel sounds heard. ?Central nervous system: Alert and oriented.  Nonfocal ?Extremities: Symmetric  in appearance  ?Skin: No rashes, lesions or ulcers on exposed skin  ?Psychiatry: Judgement and insight appear normal. Mood & affect appropriate.  ? ?Data Reviewed: I have personally reviewed following labs and imaging studies ? ?CBC: ?Recent Labs  ?Lab 04/23/21 ?1427 04/24/21 ?0739 04/25/21 ?0345  ?WBC 16.5* 11.8* 8.1  ?NEUTROABS 14.9*  --   --   ?HGB 12.3 11.5* 9.9*  ?HCT 35.9* 34.6* 30.7*  ?MCV 92.5 95.1 98.7  ?PLT 183 167 165  ? ? ?Basic Metabolic Panel: ?Recent Labs  ?Lab 04/23/21 ?1427 04/24/21 ?0739  ?NA 137 140  ?K 3.2* 3.8  ?CL 104 105  ?CO2 25 25  ?GLUCOSE 138* 134*  ?BUN 26* 36*  ?CREATININE 0.45 0.60  ?CALCIUM 8.7* 8.7*  ?MG 1.9  --   ?PHOS 2.7  --   ? ? ?GFR: ?Estimated Creatinine Clearance: 46.7 mL/min (by C-G formula based on SCr of 0.6 mg/dL). ?Liver Function Tests: ?Recent Labs  ?Lab 04/23/21 ?1427  ?AST 49*  ?ALT 28  ?ALKPHOS 43  ?BILITOT 1.5*  ?PROT 6.7  ?ALBUMIN 4.0  ? ? ?No results for input(s): LIPASE, AMYLASE in the last 168 hours. ?No results for input(s): AMMONIA in the last 168 hours. ?Coagulation Profile: ?No results for input(s): INR, PROTIME in the last 168 hours. ?Cardiac Enzymes: ?No results for input(s): CKTOTAL, CKMB, CKMBINDEX, TROPONINI in the last 168 hours. ?BNP (last 3 results) ?No results for input(s): PROBNP in the last 8760 hours. ?HbA1C: ?Recent Labs  ?  04/23/21 ?1427  ?HGBA1C 5.5  ? ? ?CBG: ?Recent Labs  ?Lab 04/24/21 ?1128 04/24/21 ?1650 04/24/21 ?2021 04/25/21 ?[id number] 04/25/21 ?1111  ?GLUCAP 182* 134* 229* 88 247*  ? ? ?Lipid Profile: ?No results for input(s): CHOL, HDL, LDLCALC, TRIG, CHOLHDL, LDLDIRECT in the last 72 hours. ?Thyroid Function Tests: ?No results for input(s): TSH, T4TOTAL, FREET4, T3FREE, THYROIDAB in the last 72 hours. ?Anemia Panel: ?No results for input(s): VITAMINB12, FOLATE, FERRITIN, TIBC, IRON, RETICCTPCT in the last 72 hours. ?Sepsis Labs: ?No results for input(s): PROCALCITON, LATICACIDVEN in the last 168 hours. ? ?No results found for this or any  previous visit (from the past 240 hour(s)).  ? ? ?Radiology Studies: ?DG Chest 2 View ? ?Result Date: 04/23/2021 ?CLINICAL DATA:  Pre-admission chest radiograph. EXAM: CHEST - 2 VIEW COMPARISON:  Chest radiograph dated 04/18/2019. FINDINGS: The heart size is enlarged. Vascular calcifications are seen in the aortic arch. An aortic valve replacement is noted. Mild atelectasis/scarring is seen in the left mid lung, similar to prior exam. The right lung is clear pleura there is no pleural effusion or pneumothorax. Degenerative changes are seen in the spine and left shoulder. Compression fractures are seen in the spine, similar to prior exam. A right shoulder arthroplasty is noted. IMPRESSION: Mild atelectasis/scarring in the left mid lung. Aortic Atherosclerosis (ICD10-I70.0). Electronically Signed   By: Zerita Boers M.D.   On: 04/23/2021 14:58  ? ?CT HEAD WO CONTRAST (5MM) ? ?Result Date: 04/23/2021 ?CLINICAL DATA:  Head trauma, minor (Age >= 65y); Neck trauma (Age >= 65y) EXAM: CT HEAD WITHOUT CONTRAST CT CERVICAL SPINE WITHOUT CONTRAST TECHNIQUE: Multidetector CT imaging of the head and cervical spine was performed  following the standard protocol without intravenous contrast. Multiplanar CT image reconstructions of the cervical spine were also generated. RADIATION DOSE REDUCTION: This exam was performed according to the departmental dose-optimization program which includes automated exposure control, adjustment of the mA and/or kV according to patient size and/or use of iterative reconstruction technique. COMPARISON:  CT 11/17/2016 FINDINGS: CT HEAD FINDINGS Brain: No evidence of acute intracranial hemorrhage or extra-axial collection.No evidence of mass lesion/concerning mass effect.The ventricles are normal in size.Unchanged left basal ganglia lacunar infarcts or prominent perivascular spaces. Vascular: Vascular calcifications.  No hyperdense vessel. Skull: Negative for skull fracture. Mild hyperostosis frontalis  internus. Sinuses/Orbits: Mild paranasal sinus mucosal thickening. The orbits are unremarkable. Other: None. CT CERVICAL SPINE FINDINGS Alignment: Normal. Skull base and vertebrae: There is no acute ce

## 2021-04-25 NOTE — Progress Notes (Signed)
Gave report to Jones Skene, Charity fundraiser, at [facility]. ? ?Bradd Burner, RN  ?

## 2021-04-25 NOTE — TOC Initial Note (Addendum)
Transition of Care (TOC) - Initial/Assessment Note  ? ? ?Patient Details  ?Name: Katherine [name] ?MRN: [id number] ?Date of Birth: 27-Jun-1945 ? ?Transition of Care (TOC) CM/SW Contact:    ?[place], LCSW ?Phone Number: ?04/25/2021, 9:55 AM ? ?Clinical Narrative:     ? ?CSW met with pt to discuss SNF recommendation. Pt lives at home alone in an apartment in [place]. She states she has aides Monday through Saturday that assist for a couple hours/day. Pt states she will be getting a new aide(unsure if new aide or new aide with additional hours). Pt states her friend Letta Median will help her "sign up" for new aide. Pt is agreeable to SNF for short term rehab. States she has been to [facility] before. CSW explained current recommendation is for SNF. Pt agreeable to this, indicates she is interested in a specific facility but can't remember the name; states "Letta Median would know." Pt reports she is fully covid vaccinated. CSW completed fl2 and faxed bed requests in hub. CSW called Letta Median and provided update on SNF. Letta Median states that pt and herself have been to [facility] in the past and she would be okay with pt going there. She states she would not like pt to go to [facility]. CSW will follow up with bed offers.    ? ?1230: CSW provided SNF offers to pt. Pt wants to go to [facility] as she is familiar with it and so is her friend Letta Median. CSW confirmed with [facility] and started SNF auth.  ? ?Expected Discharge Plan: [place] ?Barriers to Discharge: Ship broker, SNF Pending bed offer ? ? ?Patient Goals and CMS Choice ?Patient states their goals for this hospitalization and ongoing recovery are:: Rehab then return home ?  ?  ? ?Expected Discharge Plan and Services ?Expected Discharge Plan: [place] [address] [place] [address] ?  ?  ?  ?Living arrangements for the past 2 months: Apartment ?                ?  ?  ?  ?  ?  ?  ?  ?  ?  ?  ? ?Prior Living Arrangements/Services ?Living arrangements for the past 2  months: Apartment ?Lives with:: Self ?Patient language and need for interpreter reviewed:: Yes ?       ?Need for Family Participation in Patient Care: No (Comment) ?Care giver support system in place?: Yes (comment) ?  ?Criminal Activity/Legal Involvement Pertinent to Current Situation/Hospitalization: No - Comment as needed ? ?Activities of Daily Living ?Home Assistive Devices/Equipment: Gilford Rile (specify type), Cane (specify quad or straight), Shower chair with back, Raised toilet seat with rails ?ADL Screening (condition at time of admission) ?Patient's cognitive ability adequate to safely complete daily activities?: Yes ?Is the patient deaf or have difficulty hearing?: No ?Does the patient have difficulty seeing, even when wearing glasses/contacts?: Yes ?Does the patient have difficulty concentrating, remembering, or making decisions?: Yes ?Patient able to express need for assistance with ADLs?: Yes ?Does the patient have difficulty dressing or bathing?: Yes ?Independently performs ADLs?: No ?Does the patient have difficulty walking or climbing stairs?: Yes ?Weakness of Legs: Both ?Weakness of Arms/Hands: Both ? ?Permission Sought/Granted ?  ?Permission granted to share information with : Yes, Verbal Permission Granted ? Share Information with NAME: Harley,Faye (Friend)   [phone] (Mobile) ?   ?   ?   ? ?Emotional Assessment ?Appearance:: Appears stated age ?Attitude/Demeanor/Rapport: Engaged ?Affect (typically observed): Accepting ?Orientation: : Oriented to Self, Oriented to Place, Oriented  to  Time, Oriented to Situation ?Alcohol / Substance Use: Not Applicable ?Psych Involvement: No (comment) ? ?Admission diagnosis:  Pelvic fracture ([place]) [S32.9XXA] ?Right hip pain [M25.551] ?Fall, initial encounter [W19.XXXA] ?Patient Active Problem List  ? Diagnosis Date Noted  ? Pelvic fracture ([place]) 04/23/2021  ? Hypocalcemia 04/23/2021  ? CAD (coronary artery disease) 04/23/2021  ? Aortic atherosclerosis ([place])  04/23/2021  ? Rotator cuff arthropathy of left shoulder 11/12/2019  ? 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 Type 2 diabetes mellitus with complication (HCC)   ? Coronary artery disease involving native coronary artery of native heart with angina pectoris ([place])   ? S/P reverse total shoulder arthroplasty, 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  ? MRSA infection 10/26/2014  ? Diabetic foot ulcer ([place]) 10/26/2014  ? Pre-ulcerative corn or callous 10/09/2013  ? Altered mental status 04/29/2013  ? Normocytic anemia 01/06/2013  ? 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  ? Thyroid nodule 11/07/2012  ? Severe aortic stenosis 05/27/2012  ? FRACTURE, PELVIS, RIGHT 04/15/2010  ? Fibromyalgia 05/10/2009  ? Anxiety state 07/31/2007  ? Osteoporosis 07/31/2007  ? Hyperlipidemia 09/05/2006  ? DEPRESSION 09/05/2006  ? Essential hypertension 09/05/2006  ? Allergic rhinitis 09/05/2006  ? GERD 09/05/2006  ? DIVERTICULOSIS, COLON 09/05/2006  ? OSTEOARTHRITIS 09/05/2006  ? ?PCP:  [name], PA-C ?Pharmacy:   ?[id number] - [place], [address] ?[place] ?[place] [address] ?Phone: [phone] Fax: [phone] ? ? ? ? ?Social Determinants of Health (SDOH) Interventions ?  ? ?Readmission Risk Interventions ?   ? View : No data to display.  ?  ?  ?  ? ? ? ?

## 2021-04-25 NOTE — [place] FL2 (Signed)
?[place] MEDICAID FL2 LEVEL OF CARE SCREENING TOOL  ?  ? ?IDENTIFICATION  ?Patient Name: ?Katherine [name] Birthdate: January 16, 1946 Sex: female Admission Date (Current Location): ?04/23/2021  ?[place] and [place] Number: ? [place] ?  Facility and Address:  ?[facility] [place], [address], [place], [place] [address] ?     Provider Number: ?[id number]  ?Attending Physician Name and Address:  ?Dessa Phi, DO ? Relative Name and Phone Number:  ?Seth Bake (Friend)   [phone] [facility]) ?   ?Current Level of Care: ?Hospital Recommended Level of Care: ?[place] Prior Approval Number: ?  ? ?Date Approved/Denied: ?  PASRR Number: ?[id number] ? ?Discharge Plan: ?SNF ?  ? ?Current Diagnoses: ?Patient Active Problem List  ? Diagnosis Date Noted  ? Pelvic fracture ([place]) 04/23/2021  ? Hypocalcemia 04/23/2021  ? CAD (coronary artery disease) 04/23/2021  ? Aortic atherosclerosis ([place]) 04/23/2021  ? Rotator cuff arthropathy of left shoulder 11/12/2019  ? 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 Type 2 diabetes mellitus with complication (HCC)   ? Coronary artery disease involving native coronary artery of native heart with angina pectoris ([place])   ? S/P reverse total shoulder arthroplasty, 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  ? MRSA infection 10/26/2014  ? Diabetic foot ulcer ([place]) 10/26/2014  ? Pre-ulcerative corn or callous 10/09/2013  ? Altered mental status 04/29/2013  ? Normocytic anemia 01/06/2013  ? 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  ? Thyroid nodule 11/07/2012  ? Severe aortic stenosis 05/27/2012  ? FRACTURE, PELVIS, RIGHT 04/15/2010  ? Fibromyalgia 05/10/2009  ? Anxiety state 07/31/2007  ? Osteoporosis 07/31/2007  ? Hyperlipidemia 09/05/2006  ? DEPRESSION 09/05/2006  ? Essential hypertension 09/05/2006  ? Allergic rhinitis 09/05/2006  ? GERD 09/05/2006  ? DIVERTICULOSIS, COLON 09/05/2006  ? OSTEOARTHRITIS 09/05/2006  ? ? ?Orientation RESPIRATION BLADDER  Height & Weight   ?  ?Self, Time, Situation, Place ? Normal Incontinent, External catheter Weight: 129 lb (58.5 kg) ?Height:  4' 10.5" (148.6 cm)  ?BEHAVIORAL SYMPTOMS/MOOD NEUROLOGICAL BOWEL NUTRITION STATUS  ?    Continent Diet (See d/c summary)  ?AMBULATORY STATUS COMMUNICATION OF NEEDS Skin   ?Extensive Assist Verbally Normal ?  ?  ?  ?    ?     ?     ? ? ?Personal Care Assistance Level of Assistance  ?Bathing, Feeding, Dressing Bathing Assistance: Maximum assistance ?Feeding assistance: Independent ?Dressing Assistance: Maximum assistance ?   ? ?Functional Limitations Info  ?Sight, Hearing, Speech Sight Info: Impaired ?Hearing Info: Impaired ?Speech Info: Adequate  ? ? ?SPECIAL CARE FACTORS FREQUENCY  ?PT (By licensed PT), OT (By licensed OT)   ?  ?PT Frequency: 5x/week ?OT Frequency: 5x/week ?  ?  ?  ?   ? ? ?Contractures Contractures Info: Not present  ? ? ?Additional Factors Info  ?Code Status, Allergies Code Status Info: Full code ?Allergies Info: Mellaril (thioridazine), penecillins, latex, coconut oil, Pollen Extract ?  ?  ?  ?   ? ?Current Medications (04/25/2021):  This is the current hospital active medication list ?Current Facility-Administered Medications  ?Medication Dose Route Frequency Provider Last Rate Last Admin  ? 0.9 %  sodium chloride infusion   Intravenous Continuous Dessa Phi, DO 100 mL/hr at 04/25/21 0535 New Bag at 04/25/21 0535  ? acetaminophen (TYLENOL) tablet 650 mg  650 mg Oral Q6H PRN Reubin Milan, MD   650 mg at 04/24/21 [id number]  ? Or  ? acetaminophen (TYLENOL) suppository 650 mg  650 mg  Rectal Q6H PRN Reubin Milan, MD      ? albuterol (PROVENTIL) (2.5 MG/3ML) 0.083% nebulizer solution 3 mL  3 mL Inhalation BID PRN Dessa Phi, DO      ? ascorbic acid (VITAMIN C) tablet 500 mg  500 mg Oral Daily Dessa Phi, DO   500 mg at 04/24/21 1320  ? aspirin EC tablet 81 mg  81 mg Oral Daily Dessa Phi, DO   81 mg at 04/24/21 1320  ? benztropine (COGENTIN) tablet 0.5  mg  0.5 mg Oral BID Dessa Phi, DO   0.5 mg at 04/24/21 2112  ? calcium-vitamin D (OSCAL WITH D) 500-5 MG-MCG per tablet 1 tablet  1 tablet Oral Q breakfast Dessa Phi, DO   1 tablet at 04/25/21 [id number]  ? enoxaparin (LOVENOX) injection 40 mg  40 mg Subcutaneous Q24H Reubin Milan, MD   40 mg at 04/24/21 2112  ? feeding supplement (ENSURE ENLIVE / ENSURE PLUS) liquid 237 mL  237 mL Oral BID BM Reubin Milan, MD   237 mL at 04/24/21 1617  ? fentaNYL (SUBLIMAZE) injection 12.5 mcg  12.5 mcg Intravenous Q2H PRN Dessa Phi, DO   12.5 mcg at 04/24/21 1331  ? fluticasone (FLONASE) 50 MCG/ACT nasal spray 1 spray  1 spray Each Nare Daily Dessa Phi, DO   1 spray at 04/24/21 1321  ? gabapentin (NEURONTIN) capsule 200 mg  200 mg Oral Daily Dessa Phi, DO   200 mg at 04/24/21 1321  ? And  ? gabapentin (NEURONTIN) capsule 100 mg  100 mg Oral QHS Dessa Phi, DO   100 mg at 04/24/21 2112  ? hydrochlorothiazide (HYDRODIURIL) tablet 12.5 mg  12.5 mg Oral q AM Dessa Phi, DO   12.5 mg at 04/25/21 [id number]  ? hydrOXYzine (ATARAX) tablet 10 mg  10 mg Oral BID PRN Dessa Phi, DO      ? loratadine (CLARITIN) tablet 10 mg  10 mg Oral Daily Dessa Phi, DO   10 mg at 04/24/21 1321  ? nitroGLYCERIN (NITROSTAT) SL tablet 0.4 mg  0.4 mg Sublingual Q5 min PRN Reubin Milan, MD      ? ondansetron [facility]) tablet 4 mg  4 mg Oral Q6H PRN Reubin Milan, MD      ? Or  ? ondansetron [facility]) injection 4 mg  4 mg Intravenous Q6H PRN Reubin Milan, MD      ? oxyCODONE (Oxy IR/ROXICODONE) immediate release tablet 5 mg  5 mg Oral Q4H PRN Reubin Milan, MD   5 mg at 04/25/21 [id number]  ? pantoprazole (PROTONIX) EC tablet 40 mg  40 mg Oral Daily Reubin Milan, MD   40 mg at 04/24/21 0802  ? risperiDONE (RISPERDAL) tablet 3 mg  3 mg Oral QHS Reubin Milan, MD   3 mg at 04/24/21 2112  ? rosuvastatin (CRESTOR) tablet 40 mg  40 mg Oral q1800 Dessa Phi, DO   40 mg at 04/24/21 1858   ? ? ? ?Discharge Medications: ?Please see discharge summary for a list of discharge medications. ? ?Relevant Imaging Results: ? ?Relevant Lab Results: ? ? ?Additional Information ?SSN [id number] Pt is fully covid vaccinated ? ?[place], LCSW ? ? ? ? ?

## 2021-04-26 DIAGNOSIS — S32591A Other specified fracture of right pubis, initial encounter for closed fracture: Secondary | ICD-10-CM | POA: Diagnosis not present

## 2021-04-26 NOTE — Progress Notes (Signed)
Patient and all belongings given to PTAR. ?

## 2021-05-03 NOTE — Progress Notes (Deleted)
?Cardiology Office Note:   ? ?Date:  05/03/2021  ? ?ID:  Katherine [name], DOB 1945-08-22, MRN [id number] ? ?PCP:  [name], PA-C  ?[facility] Providers ?Cardiologist:  Sherren Mocha, MD { ?Click to update primary MD,subspecialty MD or APP then REFRESH:1}  *** ?Referring MD: [name], PA-C  ? ?Chief Complaint:  No chief complaint on file. ?{Click here for Visit Info    :1}  ? ?Patient Profile: ?Coronary artery disease  ?S/p DES to LCx x 2, s/p DES to RCA in 03/2019 ?Aortic stenosis ?S/p TAVR in 03/2019 ?Echocardiogram 3/21: mean 5 mmHg  ?Echocardiogram 4/21: mean 10 mmHg ?Hx of TIA in 12/2018 ?Remote AFib ?Hx of pulmonary embolism  ?Hypothyroidism  ?Dementia  ?Hypertension  ?Hyperlipidemia  ?Diabetes mellitus  ?Aortic atherosclerosis ?  ?Prior CV studies: ?Echocardiogram 04/21/2020 ?EF 60-65, no RWMA, GR 1 DD, normal RVSF, trivial MR, s/p TAVR with normal structure and function (mean gradient 6 mmHg), no paravalvular leak ? ?Echocardiogram 05/22/2019 ?EF 60-65, no RWMA, mild LVH, normal RVSF, mild LAE, trivial MR, s/p TAVR (mean 10 mmHg), no PVL ?  ?Echocardiogram 04/23/2019 ?EF 65-70, GR 1 DD, mild MR, mild to moderate TR, s/p TAVR (mean gradient 5 mmHg), no PVL, RVSP 30.2 ?  ?Cardiac catheterization 04/15/2019 ?LAD mid 32 ?LCx proximal 75, mid 75 ?RCA proximal 80 ?PCI: Orbital atherectomy, 3 x 8 mm Resolute Onyx DES to the proximal LCx; 2.5 x 26 mm Resolute Onyx DES to the mid LCx ?PCI: Orbital atherectomy, 3 x 22 mm Resolute Onyx DES to the proximal RCA ?  ?Carotid US 08/2018 ?Bilateral ICA 1-39 ?*** ?{Select studies to display:26339}  ? ?History of Present Illness:   ?Katherine [name] is a 76 y.o. female with the above problem list.  She was last seen by Angelena Form, PA-C in March 2022.  She was admitted 04/23/2021-04/25/2021 with a pelvic fracture in the setting of a mechanical fall at home.  No surgery was indicated.  She was discharged to SNF.*** ?   ?Past Medical History:  ?Diagnosis Date  ?  ANXIETY 07/31/2007  ? Atrial fibrillation ([place])   ? a. per structural notes, previous hx remote Afib.  ? CAD (coronary artery disease)   ? a. 03/2019 orbital atherectomy/PCI with DES to RCA, prox Cx, and mid Cx.  ? DEPRESSION 09/05/2006  ? DIABETES MELLITUS, TYPE II   ? Diabetic foot ulcer ([place]) 10/26/2014  ? DIVERTICULOSIS, COLON 09/05/2006  ? FIBROMYALGIA 05/10/2009  ? GERD 09/05/2006  ? HYPERLIPIDEMIA 09/05/2006  ? HYPERTENSION 09/05/2006  ? MRSA infection 10/26/2014  ? OSTEOARTHRITIS 09/05/2006  ? OSTEOPOROSIS 07/31/2007  ? 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 s/p TAVR with a 26 mm Edwards S3U via the TF approach by Drs Burt Knack and Roxy Manns  ? Severe aortic stenosis   ? s/p TAVR  ? Unspecified hearing loss 10/09/2007  ? ?Current Medications: ?No outpatient medications have been marked as taking for the 05/04/21 encounter (Appointment) with Liliane Shi, PA-C.  ?  ?Allergies:   Penicillins, Pollen extract, Coconut oil, Latex, and Mellaril [thioridazine]  ? ?Social History  ? ?Tobacco Use  ? Smoking status: Never  ? Smokeless tobacco: Never  ?Vaping Use  ? Vaping Use: Never used  ?Substance Use Topics  ? Alcohol use: No  ?  Alcohol/week: 0.0 standard drinks  ? Drug use: No  ?  ?Family Hx: ?[name] family history includes Coronary artery disease in an other family member; Diabetes in an other  family member; Heart failure in her father and mother. There is no history of Colon cancer, Esophageal cancer, Stomach cancer, or Rectal cancer. ? ?ROS  ? ?EKGs/Labs/Other Test Reviewed:   ? ?EKG:  EKG is *** ordered today.  The ekg ordered today demonstrates *** ? ?Recent Labs: ?04/23/2021: ALT 28; Magnesium 1.9 ?04/24/2021: BUN 36; Creatinine, Ser 0.60; Potassium 3.8; Sodium 140 ?04/25/2021: Hemoglobin 9.9; Platelets 165  ? ?Recent Lipid Panel ?No results for input(s): CHOL, TRIG, HDL, VLDL, LDLCALC, LDLDIRECT in the last 8760 hours.  ? ?Risk Assessment/Calculations:   ?{Does this patient have ATRIAL  FIBRILLATION?:[phone]} ?    ?Physical Exam:   ? ?VS:  There were no vitals taken for this visit.   ? ?Wt Readings from Last 3 Encounters:  ?04/23/21 129 lb (58.5 kg)  ?04/21/20 132 lb (59.9 kg)  ?10/13/19 140 lb 6.4 oz (63.7 kg)  ?  ?Physical Exam *** ?    ?ASSESSMENT & PLAN:   ?No problem-specific Assessment & Plan notes found for this encounter. ?  ?*** ?1. Other chest pain ?She has had some chest discomfort since she fell some months ago.  Her symptoms are atypical for ischemia.  Her ECG is unchanged.  I have asked her to contact us if she has worsening chest symptoms.  At that point, I suspect she will need a nuclear stress test.  She has been given a prescription for nitroglycerin to use as needed. ?  ?2. Coronary artery disease involving native coronary artery of native heart without angina pectoris ?Status post DES x2 to the LCx and DES x1 to the RCA March 2021.  Continue aspirin, clopidogrel, rosuvastatin.  It has been recommended that she stop taking clopidogrel at the end of September.  She has a full bottle.  I told her that if it is easier for her just to finish the bottle, she can certainly do that instead of stopping it at the end of September.  As noted, she has had some chest discomfort but it does not sound as though she is having anginal symptoms.  If she has more chest discomfort or worsening symptoms, she will need nuclear stress testing. ?  ?3. Nonrheumatic aortic valve stenosis ?4. S/P TAVR (transcatheter aortic valve replacement) ?Her most recent echocardiogram demonstrated well-functioning aortic valve prosthesis.  Continue SBE prophylaxis.  She will have a follow-up with the structural heart clinic again in April 2022 with a repeat echocardiogram. ?  ?5. Essential hypertension ?Blood pressure elevated today.  She notes a salty meal yesterday.  Her pressure is usually well controlled.  Continue to monitor for now. ?  ?6. Mixed hyperlipidemia ?Continue high intensity statin therapy.   Arrange fasting CMET, lipids. ? ?     ?{Are you ordering a CV Procedure (e.g. stress test, cath, DCCV, TEE, etc)?   Press F2        :[id number]  ?Dispo:  No follow-ups on file.  ? ?Medication Adjustments/Labs and Tests Ordered: ?Current medicines are reviewed at length with the patient today.  Concerns regarding medicines are outlined above.  ?Tests Ordered: ?No orders of the defined types were placed in this encounter. ? ?Medication Changes: ?No orders of the defined types were placed in this encounter. ? ?Signed, ?Richardson Dopp, PA-C  ?05/03/2021 1:59 PM    ?[facility] ?[place], [place], [place]  [address] ?Phone: [phone]; Fax: [phone]  ?

## 2021-05-04 ENCOUNTER — Ambulatory Visit: Payer: 59 | Admitting: Physician Assistant

## 2021-05-04 DIAGNOSIS — I350 Nonrheumatic aortic (valve) stenosis: Secondary | ICD-10-CM

## 2021-05-04 DIAGNOSIS — I10 Essential (primary) hypertension: Secondary | ICD-10-CM

## 2021-05-04 DIAGNOSIS — I700 Atherosclerosis of aorta: Secondary | ICD-10-CM

## 2021-05-04 DIAGNOSIS — I2510 Atherosclerotic heart disease of native coronary artery without angina pectoris: Secondary | ICD-10-CM

## 2021-05-04 DIAGNOSIS — E785 Hyperlipidemia, unspecified: Secondary | ICD-10-CM

## 2021-05-11 ENCOUNTER — Ambulatory Visit (INDEPENDENT_AMBULATORY_CARE_PROVIDER_SITE_OTHER): Payer: 59

## 2021-05-11 ENCOUNTER — Ambulatory Visit (INDEPENDENT_AMBULATORY_CARE_PROVIDER_SITE_OTHER): Payer: 59 | Admitting: Orthopaedic Surgery

## 2021-05-11 DIAGNOSIS — M25552 Pain in left hip: Secondary | ICD-10-CM

## 2021-05-11 DIAGNOSIS — S32414D Nondisplaced fracture of anterior wall of right acetabulum, subsequent encounter for fracture with routine healing: Secondary | ICD-10-CM

## 2021-05-11 NOTE — Progress Notes (Signed)
The patient comes in for follow-up after having right-sided pelvic fractures from a mechanical fall.  She lives alone and is having now to stay in skilled nursing since she was nonweightbearing on her right lower extremity.  She is reporting pain and soreness. ? ?I can put her right hip through internal and external rotation with some pain in the groin area. ? ?An AP pelvis shows that the rami fractures are healing up nicely as well as the pubis fracture.  The anterior acetabular fracture is not seen thus showing good signs of healing and the hip is well located with no significant arthritic changes. ? ?I gave her a note to give the facility that they can start trying to advance her to full weightbearing as she tolerates but only up with assistance given her fall risk.  The next time I need to see her is in 4 weeks with a repeat AP pelvis. ?

## 2021-05-24 ENCOUNTER — Emergency Department (HOSPITAL_COMMUNITY)
Admission: EM | Admit: 2021-05-24 | Discharge: 2021-05-25 | Disposition: A | Discharge status: Skilled Nursing Facility | Payer: 59 | Attending: Emergency Medicine | Admitting: Emergency Medicine

## 2021-05-24 ENCOUNTER — Emergency Department (HOSPITAL_COMMUNITY): Payer: 59

## 2021-05-24 ENCOUNTER — Encounter (HOSPITAL_COMMUNITY): Payer: Self-pay

## 2021-05-24 DIAGNOSIS — Z9181 History of falling: Secondary | ICD-10-CM | POA: Insufficient documentation

## 2021-05-24 DIAGNOSIS — Z609 Problem related to social environment, unspecified: Secondary | ICD-10-CM | POA: Diagnosis present

## 2021-05-24 DIAGNOSIS — R262 Difficulty in walking, not elsewhere classified: Secondary | ICD-10-CM | POA: Insufficient documentation

## 2021-05-24 DIAGNOSIS — Z20822 Contact with and (suspected) exposure to covid-19: Secondary | ICD-10-CM | POA: Diagnosis not present

## 2021-05-24 DIAGNOSIS — S32512A Fracture of superior rim of left pubis, initial encounter for closed fracture: Secondary | ICD-10-CM | POA: Diagnosis not present

## 2021-05-24 DIAGNOSIS — S32511A Fracture of superior rim of right pubis, initial encounter for closed fracture: Secondary | ICD-10-CM | POA: Diagnosis not present

## 2021-05-24 DIAGNOSIS — N390 Urinary tract infection, site not specified: Secondary | ICD-10-CM

## 2021-05-24 DIAGNOSIS — Z7689 Persons encountering health services in other specified circumstances: Secondary | ICD-10-CM

## 2021-05-24 DIAGNOSIS — Z7982 Long term (current) use of aspirin: Secondary | ICD-10-CM | POA: Diagnosis not present

## 2021-05-24 DIAGNOSIS — S32599D Other specified fracture of unspecified pubis, subsequent encounter for fracture with routine healing: Secondary | ICD-10-CM

## 2021-05-24 DIAGNOSIS — W19XXXA Unspecified fall, initial encounter: Secondary | ICD-10-CM | POA: Diagnosis not present

## 2021-05-24 DIAGNOSIS — Z91040 Latex allergy status: Secondary | ICD-10-CM | POA: Diagnosis not present

## 2021-05-24 DIAGNOSIS — S3210XA Unspecified fracture of sacrum, initial encounter for closed fracture: Secondary | ICD-10-CM | POA: Diagnosis not present

## 2021-05-24 DIAGNOSIS — Y92129 Unspecified place in nursing home as the place of occurrence of the external cause: Secondary | ICD-10-CM | POA: Insufficient documentation

## 2021-05-24 LAB — URINALYSIS, ROUTINE W REFLEX MICROSCOPIC
Bilirubin Urine: NEGATIVE
Glucose, UA: NEGATIVE mg/dL
Hgb urine dipstick: NEGATIVE
Ketones, ur: NEGATIVE mg/dL
Nitrite: NEGATIVE
Protein, ur: NEGATIVE mg/dL
Specific Gravity, Urine: 1.009 (ref 1.005–1.030)
pH: 7 (ref 5.0–8.0)

## 2021-05-24 LAB — CBC WITH DIFFERENTIAL/PLATELET
Abs Immature Granulocytes: 0.04 10*3/uL (ref 0.00–0.07)
Basophils Absolute: 0.1 10*3/uL (ref 0.0–0.1)
Basophils Relative: 0 %
Eosinophils Absolute: 0.1 10*3/uL (ref 0.0–0.5)
Eosinophils Relative: 1 %
HCT: 37.7 % (ref 36.0–46.0)
Hemoglobin: 12.2 g/dL (ref 12.0–15.0)
Immature Granulocytes: 0 %
Lymphocytes Relative: 9 %
Lymphs Abs: 1.2 10*3/uL (ref 0.7–4.0)
MCH: 30.7 pg (ref 26.0–34.0)
MCHC: 32.4 g/dL (ref 30.0–36.0)
MCV: 94.7 fL (ref 80.0–100.0)
Monocytes Absolute: 0.8 10*3/uL (ref 0.1–1.0)
Monocytes Relative: 6 %
Neutro Abs: 11.5 10*3/uL — ABNORMAL HIGH (ref 1.7–7.7)
Neutrophils Relative %: 84 %
Platelets: 303 10*3/uL (ref 150–400)
RBC: 3.98 MIL/uL (ref 3.87–5.11)
RDW: 13.3 % (ref 11.5–15.5)
WBC: 13.6 10*3/uL — ABNORMAL HIGH (ref 4.0–10.5)
nRBC: 0 % (ref 0.0–0.2)

## 2021-05-24 LAB — COMPREHENSIVE METABOLIC PANEL
ALT: 33 U/L (ref 0–44)
AST: 34 U/L (ref 15–41)
Albumin: 3.5 g/dL (ref 3.5–5.0)
Alkaline Phosphatase: 113 U/L (ref 38–126)
Anion gap: 10 (ref 5–15)
BUN: 16 mg/dL (ref 8–23)
CO2: 24 mmol/L (ref 22–32)
Calcium: 8.7 mg/dL — ABNORMAL LOW (ref 8.9–10.3)
Chloride: 97 mmol/L — ABNORMAL LOW (ref 98–111)
Creatinine, Ser: 0.42 mg/dL — ABNORMAL LOW (ref 0.44–1.00)
GFR, Estimated: >60 mL/min (ref >60)
Glucose, Bld: 147 mg/dL — ABNORMAL HIGH (ref 70–99)
Potassium: 4.2 mmol/L (ref 3.5–5.1)
Sodium: 131 mmol/L — ABNORMAL LOW (ref 135–145)
Total Bilirubin: 0.5 mg/dL (ref 0.3–1.2)
Total Protein: 6.8 g/dL (ref 6.5–8.1)

## 2021-05-24 LAB — RESP PANEL BY RT-PCR (FLU A&B, COVID) ARPGX2
Influenza A by PCR: NEGATIVE
Influenza B by PCR: NEGATIVE
SARS Coronavirus 2 by RT PCR: NEGATIVE

## 2021-05-24 MED ORDER — ASPIRIN EC 81 MG PO TBEC
81.0000 mg | DELAYED_RELEASE_TABLET | Freq: Every day | ORAL | Status: DC
Start: 2021-05-24 — End: 2021-05-26
  Administered 2021-05-25: 81 mg via ORAL
  Filled 2021-05-24: qty 1

## 2021-05-24 MED ORDER — LORATADINE 10 MG PO TABS
10.0000 mg | ORAL_TABLET | Freq: Every day | ORAL | Status: DC
  Administered 2021-05-25: 10 mg via ORAL
  Filled 2021-05-24: qty 1

## 2021-05-24 MED ORDER — OXYCODONE HCL 5 MG PO TABS
5.0000 mg | ORAL_TABLET | ORAL | Status: DC | PRN
  Administered 2021-05-24 – 2021-05-25 (×3): 5 mg via ORAL
  Filled 2021-05-24 (×3): qty 1

## 2021-05-24 MED ORDER — ALBUTEROL SULFATE HFA 108 (90 BASE) MCG/ACT IN AERS: 2.0000 | INHALATION_SPRAY | Freq: Two times a day (BID) | RESPIRATORY_TRACT | Status: DC | PRN

## 2021-05-24 MED ORDER — NITROFURANTOIN MONOHYD MACRO 100 MG PO CAPS
100.0000 mg | ORAL_CAPSULE | Freq: Two times a day (BID) | ORAL | Status: DC
  Administered 2021-05-24 – 2021-05-25 (×2): 100 mg via ORAL
  Filled 2021-05-24 (×3): qty 1

## 2021-05-24 MED ORDER — RISPERIDONE 2 MG PO TABS
3.0000 mg | ORAL_TABLET | Freq: Every day | ORAL | Status: DC
  Administered 2021-05-24: 3 mg via ORAL
  Filled 2021-05-24: qty 1

## 2021-05-24 MED ORDER — PANTOPRAZOLE SODIUM 40 MG PO TBEC
40.0000 mg | DELAYED_RELEASE_TABLET | Freq: Every day | ORAL | Status: DC
  Administered 2021-05-24 – 2021-05-25 (×2): 40 mg via ORAL
  Filled 2021-05-24 (×2): qty 1

## 2021-05-24 MED ORDER — HYDROCHLOROTHIAZIDE 12.5 MG PO TABS
12.5000 mg | ORAL_TABLET | Freq: Every day | ORAL | Status: DC
  Administered 2021-05-25: 12.5 mg via ORAL
  Filled 2021-05-24: qty 1

## 2021-05-24 MED ORDER — GABAPENTIN 100 MG PO CAPS
100.0000 mg | ORAL_CAPSULE | Freq: Two times a day (BID) | ORAL | Status: DC
  Administered 2021-05-24 – 2021-05-25 (×2): 100 mg via ORAL
  Filled 2021-05-24 (×2): qty 1

## 2021-05-24 MED ORDER — METFORMIN HCL 500 MG PO TABS
500.0000 mg | ORAL_TABLET | Freq: Two times a day (BID) | ORAL | Status: DC
Start: 2021-05-24 — End: 2021-05-26
  Administered 2021-05-24 – 2021-05-25 (×3): 500 mg via ORAL
  Filled 2021-05-24 (×3): qty 1

## 2021-05-24 MED ORDER — ROSUVASTATIN CALCIUM 20 MG PO TABS
40.0000 mg | ORAL_TABLET | Freq: Every day | ORAL | Status: DC
  Administered 2021-05-24 – 2021-05-25 (×2): 40 mg via ORAL
  Filled 2021-05-24 (×2): qty 2

## 2021-05-24 MED ORDER — HYDROXYZINE HCL 10 MG PO TABS: 10.0000 mg | ORAL_TABLET | Freq: Two times a day (BID) | ORAL | Status: DC | PRN

## 2021-05-24 MED ORDER — FLUTICASONE PROPIONATE 50 MCG/ACT NA SUSP
1.0000 | Freq: Every day | NASAL | Status: DC
  Administered 2021-05-25: 1 via NASAL
  Filled 2021-05-24 (×2): qty 16

## 2021-05-24 NOTE — Evaluation (Signed)
Physical Therapy Evaluation ?Patient Details ?Name: Katherine [name] ?MRN: [id number] ?DOB: 1945-02-28 ?Today's Date: 05/24/2021 ? ?History of Present Illness ? 76 yo female brought to ED by family with family reporting pt unable to care for herself at home. Recent history of R sacral ala fx + pubic ramus fx 04/24/21. Pt was discharged to SNF for ST rehab. Per family report, pt was discharged from SNF and  has been home for ~10 days prior to coming to ED.  ?Clinical Impression ? On eval in ED, pt required Mod A for bed mobility, Min A for transfers/ambulation.She walked across her room and back with use of a RW. Pt rated pain 3/10 during session. Pt required cueing for all tasks throughout session. Family was present during session. Family feels pt was discharged from [facility] rehab too soon. Pt lives alone but has been unable to safely manage at home alone since her discharge from SNF. Discussed d/c plan with pt and family-plan is for ST rehab at SNF to allow pt to regain her functional independence prior to returning home alone. Family states they are unable to continue to provide 24/7 care. Pt states that she does not feel she can safely manage at home alone. At this time, PT recommendation is for ST SNF rehab.   ?   ? ?Recommendations for follow up therapy are one component of a multi-disciplinary discharge planning process, led by the attending physician.  Recommendations may be updated based on patient status, additional functional criteria and insurance authorization. ? ?Follow Up Recommendations Skilled nursing-short term rehab (<3 hours/day) ? ?  ?Assistance Recommended at Discharge Frequent or constant Supervision/Assistance  ?Patient can return home with the following ? A little help with walking and/or transfers;A little help with bathing/dressing/bathroom;Assistance with cooking/housework;Direct supervision/assist for medications management;Assist for transportation;Help with stairs or ramp for entrance ? ?   ?Equipment Recommendations None recommended by PT  ?Recommendations for Other Services ?    ?  ?Functional Status Assessment Patient has had a recent decline in their functional status and demonstrates the ability to make significant improvements in function in a reasonable and predictable amount of time.  ? ?  ?Precautions / Restrictions Precautions ?Precautions: Fall ?Restrictions ?Weight Bearing Restrictions: No ?Other Position/Activity Restrictions: WBAT  ? ?  ? ?Mobility ? Bed Mobility ?Overal bed mobility: Needs Assistance ?Bed Mobility: Supine to Sit, Sit to Supine ?  ?  ?Supine to sit: Mod assist, HOB elevated ?Sit to supine: Mod assist, HOB elevated ?  ?General bed mobility comments: Assist for trunk and bil LEs. Increased time. Cues required. Pt is fearful of falling ?  ? ?Transfers ?Overall transfer level: Needs assistance ?Equipment used: Rolling walker (2 wheels) ?Transfers: Sit to/from Stand ?Sit to Stand: Min assist, From elevated surface ?  ?  ?  ?  ?  ?General transfer comment: Assist to rise, steady, control descent. Cues for safety, technique, hand placement. Due to pt's stature, had to use step stool for her to safely get back onto stretcher. ?  ? ?Ambulation/Gait ?Ambulation/Gait assistance: Min assist, Min guard ?Gait Distance (Feet): 15 Feet ?Assistive device: Rolling walker (2 wheels) ?Gait Pattern/deviations: Step-through pattern, Decreased stride length ?  ?  ?  ?General Gait Details: Intermittent assist to manage RW.  Slow gait speed. Pt able to walk across room and back with RW. Tolerated distance well. ? ?Stairs ?  ?  ?  ?  ?  ? ?Wheelchair Mobility ?  ? ?Modified Rankin (Stroke Patients Only) ?  ? ?  ? ?  Balance Overall balance assessment: Needs assistance ?  ?  ?  ?  ?Standing balance support: Bilateral upper extremity supported, During functional activity, Reliant on assistive device for balance ?Standing balance-Leahy Scale: Poor ?  ?  ?  ?  ?  ?  ?  ?  ?  ?  ?  ?  ?    ? ? ? ?Pertinent Vitals/Pain Pain Assessment ?Pain Assessment: 0-10 ?Pain Score: 3  ?Pain Location: R pelvic area ?Pain Descriptors / Indicators: Discomfort, Sore, Grimacing ?Pain Intervention(s): Limited activity within patient's tolerance, Monitored during session, Repositioned  ? ? ?Home Living Family/patient expects to be discharged to:: Private residence ?Living Arrangements: Alone ?Available Help at Discharge: Family;Available PRN/intermittently ?Type of Home: Apartment ?Home Access: Stairs to enter ?Entrance Stairs-Rails: None ?Entrance Stairs-Number of Steps: 2 then walkway then steps ?  ?Home Layout: One level ?Home Equipment: Rollator (4 wheels);BSC/3in1;Shower seat;Cane - single point;Rolling Walker (2 wheels) ?   ?  ?Prior Function Prior Level of Function : Needs assist ?  ?  ?  ?Physical Assist : Mobility (physical) ?  ?  ?Mobility Comments: uses walker-still painful ?ADLs Comments: needs assistance for bathing, dressing, IADLs ?  ? ? ?Hand Dominance  ?   ? ?  ?Extremity/Trunk Assessment  ? Upper Extremity Assessment ?Upper Extremity Assessment: Defer to OT evaluation ?  ? ?Lower Extremity Assessment ?Lower Extremity Assessment: Generalized weakness ?  ? ?Cervical / Trunk Assessment ?Cervical / Trunk Assessment: Normal  ?Communication  ? Communication: No difficulties  ?Cognition Arousal/Alertness: Awake/alert ?Behavior During Therapy: [facility] for tasks assessed/performed ?Overall Cognitive Status: History of cognitive impairments - at baseline ?  ?  ?  ?  ?  ?  ?  ?  ?  ?  ?  ?  ?  ?  ?  ?  ?General Comments: Some baseline history of mild cognitive issues per family-pt [facility] for tasks assessed. She tends to immediately ask for help before putting forth her full effort at times. ?  ?  ? ?  ?General Comments   ? ?  ?Exercises    ? ?Assessment/Plan  ?  ?PT Assessment Patient needs continued PT services  ?PT Problem List Decreased strength;Decreased mobility;Decreased range of motion;Decreased activity  tolerance;Decreased balance;Decreased knowledge of use of DME;Pain ? ?   ?  ?PT Treatment Interventions DME instruction;Therapeutic activities;Therapeutic exercise;Gait training;Patient/family education;Balance training;Functional mobility training   ? ?PT Goals (Current goals can be found in the Care Plan section)  ?Acute Rehab PT Goals ?Patient Stated Goal: less pain. regain plof before fx ?PT Goal Formulation: With patient/family ?Time For Goal Achievement: 06/07/21 ?Potential to Achieve Goals: Good ? ?  ?Frequency Min 2X/week ?  ? ? ?Co-evaluation   ?  ?  ?  ?  ? ? ?  ?AM-PAC PT "6 Clicks" Mobility  ?Outcome Measure Help needed turning from your back to your side while in a flat bed without using bedrails?: A Lot ?Help needed moving from lying on your back to sitting on the side of a flat bed without using bedrails?: A Lot ?Help needed moving to and from a bed to a chair (including a wheelchair)?: A Little ?Help needed standing up from a chair using your arms (e.g., wheelchair or bedside chair)?: A Little ?Help needed to walk in hospital room?: A Little ?Help needed climbing 3-5 steps with a railing? : A Lot ?6 Click Score: 15 ? ?  ?End of Session Equipment Utilized During Treatment: Gait belt ?Activity Tolerance: Patient tolerated  treatment well ?Patient left: in bed;with call bell/phone within reach;with family/visitor present ?  ?PT Visit Diagnosis: History of falling (Z91.81);Difficulty in walking, not elsewhere classified (R26.2);Pain ?Pain - Right/Left: Right ?Pain - part of body:  (pelvis) ?  ? ?Time: [id number] ?PT Time Calculation (min) (ACUTE ONLY): 30 min ? ? ?Charges:   PT Evaluation ?$PT Eval Moderate Complexity: 1 Mod ?  ?  ?   ? ? ? ? ?Aniza Shor P, PT ?Acute Rehabilitation  ?Office: [phone] ?Pager: [phone] ? ?  ? ?

## 2021-05-24 NOTE — ED Provider Notes (Signed)
?  Physical Exam  ?BP (!) 158/98   Pulse 96   Temp 98.6 ?F (37 ?C) (Oral)   Resp (!) 22   SpO2 96%  ? ?Physical Exam ? ?Procedures  ?Procedures ? ?ED Course / MDM  ?  ?Medical Decision Making ?Patient is here because she needs rehab placement.  Patient recently had a pelvic fracture and unable to manage at home.  UA showed possible UTI and started on nitrofurantoin.  Patient was seen by PT and recommend rehab.  Social work is working on the rehab placement.  Patient started on home meds.  ? ?Problems Addressed: ?Closed fracture of pubic ramus, unspecified laterality, with routine healing, subsequent encounter: chronic illness or injury ? ?Amount and/or Complexity of Data Reviewed ?Labs: ordered. Decision-making details documented in ED Course. ?Radiology: ordered and independent interpretation performed. Decision-making details documented in ED Course. ? ?Risk ?OTC drugs. ?Prescription drug management. ? ? ? ? ? ? ? ?  ?Drenda Freeze, MD ?05/24/21 1919 ? ?

## 2021-05-24 NOTE — Social Work (Signed)
CSW spoke to Pt. At bedside, the Pt. Is very aware of her surroundings. The Pt niece was in the room reported that she felt that the facility released the Pt. To soon and that she would not like the Pt. To go back there. The Pt. Was in SNF for 18 days, CSW explained that she may run into Co-pay days with her return. The Pt. Wants the niece to be point of contact CSW will update the Pt. Chart with the niece. The niece's name is Elby Showers. The CSW has started the process for the FL2  ?

## 2021-05-24 NOTE — ED Triage Notes (Signed)
Pt BIB [facility] from home. Pt fell and broke pelvis 1 month ago and went to rehab. DC from rehab about 10 days ago but family reports pt is unable to care for self. Was not able to ambulate to stretcher. Family seeking placement again. Pt c/o hip pain with movement, also mild HA and cough x1 week ? ?150/70 ?HR 80 ?95% RA ?CBG 153 ?

## 2021-05-24 NOTE — ED Provider Notes (Signed)
?[facility]-EMERGENCY DEPT ?Provider Note ? ? ?CSN: [id number] ?Arrival date & time: 05/24/21  1329 ? ?  ? ?History ? ?Chief Complaint  ?Patient presents with  ? Hip Pain  ? snf rehab  ? ? ?Katherine [name] is a 76 y.o. female.  Presented to ER due to concern for placement.  Patient had a fall 1 month ago.  Had nonoperative pelvic fractures, discharged to a rehab center.  Patient then went home approximately 9 days ago.  Lives alone.  Her niece states that patient is struggling to care for herself.  Do have home health but this is not satisfactory/sufficient.  Patient states that she still has significant pain with bearing weight or moving her leg.  Does not have any pain right now.  Family feels like she was discharged too early from the rehab.  Want to get her placed back into a rehab center.  Niece at bedside provides most of history.  No new falls. ? ?Review chart -last saw Dr. Magnus IvanBlackman in April 12, fractures were healing well, he advised advancing to weightbearing as tolerated. ? ?HPI ? ?  ? ?Home Medications ?Prior to Admission medications   ?Medication Sig Start Date End Date Taking? Authorizing Provider  ?acetaminophen (TYLENOL) 500 MG tablet Take 500 mg by mouth every 6 (six) hours as needed for mild pain or headache.    [provider]  ?albuterol (VENTOLIN HFA) 108 (90 Base) MCG/ACT inhaler Inhale 2 puffs into the lungs 2 (two) times daily as needed for wheezing or shortness of breath. 08/16/18   [provider]  ?alendronate (FOSAMAX) 70 MG tablet Take 70 mg by mouth once a week. 11/23/20   [provider]  ?ascorbic acid (VITAMIN C) 500 MG tablet Take 500 mg by mouth daily.    [provider]  ?aspirin EC 81 MG tablet Take 81 mg by mouth daily.     [provider]  ?azithromycin (ZITHROMAX) 500 MG tablet Take 1 tablet (500 mg) one hour prior to all dental visits. ?Patient taking differently: Take 500 mg by mouth See admin instructions. Take  500 mg by mouth one hour prior to all dental visits 04/21/20   Janetta Horahompson, Kathryn R, PA-C  ?benztropine (COGENTIN) 0.5 MG tablet Take 0.5 mg by mouth 2 (two) times daily.    [provider]  ?beta carotene [id number] UNIT capsule Take 25,000 Units by mouth daily.    [provider]  ?calcium-vitamin D (OSCAL WITH D) 500-200 MG-UNIT tablet Take 1 tablet by mouth 3 (three) times daily. ?Patient taking differently: Take 1 tablet by mouth in the morning and at bedtime. 02/25/15   [name], [name], MD  ?cetirizine (ZYRTEC) 10 MG tablet TAKE 1 TABLET BY MOUTH DAILY ?Patient taking differently: Take 10 mg by mouth daily. 11/02/15   [name], [name], MD  ?diclofenac sodium (VOLTAREN) 1 % GEL Apply 2-4 g topically 4 (four) times daily as needed (for pain). 04/10/18   [provider]  ?fluticasone (FLONASE) 50 MCG/ACT nasal spray Place 1 spray into both nostrils daily. 06/05/18   [provider]  ?gabapentin (NEURONTIN) 100 MG capsule Take 100-200 mg by mouth See admin instructions. Take 200 mg by mouth in the morning and 100 mg at bedtime 04/20/16   [provider]  ?hydrochlorothiazide (MICROZIDE) 12.5 MG capsule Take 12.5 mg by mouth in the morning. 12/03/20   [provider]  ?hydrOXYzine (ATARAX/VISTARIL) 10 MG tablet Take 1 tablet (10 mg total) by mouth  2 (two) times daily as needed. ?Patient taking differently: Take 10 mg by mouth in the morning and at bedtime. 12/11/13   Corwin Levins, MD  ?ipratropium (ATROVENT) 0.06 % nasal spray USE 1 SPRAY IN EACH NOSTRIL THREE TIMES DAILY ?Patient taking differently: Place 1 spray into both nostrils 3 (three) times daily. 07/26/15   Corwin Levins, MD  ?metFORMIN (GLUCOPHAGE) 500 MG tablet Take 500 mg by mouth in the morning and at bedtime. 05/18/16   [provider]  ?Multiple Vitamin (MULTIVITAMIN WITH MINERALS) TABS tablet Take 1 tablet by mouth daily.    [provider]  ?NAMZARIC 28-10 MG CP24 Take 1 capsule by mouth daily.  11/24/20   [provider]  ?nitroGLYCERIN (NITROSTAT) 0.4 MG SL tablet DISSOLVE 1 TABLET UNDER THE TONGUE EVERY 5 MINUTES AS NEEDED FOR CHEST PAIN AS DIRECTED ?Patient taking differently: Place 0.4 mg under the tongue every 5 (five) minutes as needed for chest pain. 12/20/20   [name], MD  ?Omega-3 1000 MG CAPS Take 1,000 mg by mouth every Monday, Wednesday, and Friday.    [provider]  ?Dola Argyle LANCETS 33G MISC CHECK BLOOD SUGAR ONCE DAILY AS DIRECTED 06/23/15   Corwin Levins, MD  ?Lum Babe test strip SMARTSIG:1 Each Via Meter Twice Daily 11/17/20   [provider]  ?oxyCODONE (OXY IR/ROXICODONE) 5 MG immediate release tablet Take 1 tablet (5 mg total) by mouth every 4 (four) hours as needed for moderate pain. 04/25/21   [name], DO  ?pantoprazole (PROTONIX) 40 MG tablet TAKE 1 TABLET BY MOUTH DAILY ?Patient taking differently: Take 40 mg by mouth at bedtime. 11/16/15   Corwin Levins, MD  ?Potassium Chloride ER 20 MEQ TBCR Take 20 mEq by mouth in the morning and at bedtime. 07/04/16   [provider]  ?risperiDONE (RISPERDAL) 3 MG tablet Take 3 mg by mouth at bedtime.    [provider]  ?rosuvastatin (CRESTOR) 40 MG tablet TAKE 1 TABLET(40 MG) BY MOUTH DAILY AT 6 PM ?Patient taking differently: Take 40 mg by mouth daily at 6 PM. 06/22/20   Tereso Newcomer T, PA-C  ?sodium chloride (OCEAN) 0.65 % SOLN nasal spray Place 1 spray into both nostrils as needed for congestion.    [provider]  ?UREA 20 INTENSIVE HYDRATING 20 % cream Apply 1 application. topically daily as needed (for dry skin). 08/07/16   [provider]  ?   ? ?Allergies    ?Penicillins, Pollen extract, Coconut oil, Latex, and Mellaril [thioridazine]   ? ?Review of Systems   ?Review of Systems  ?Constitutional:  Negative for chills and fever.  ?HENT:  Negative for ear pain and sore throat.   ?Eyes:  Negative for pain and visual disturbance.  ?Respiratory:  Negative  for cough and shortness of breath.   ?Cardiovascular:  Negative for chest pain and palpitations.  ?Gastrointestinal:  Negative for abdominal pain and vomiting.  ?Genitourinary:  Negative for dysuria and hematuria.  ?Musculoskeletal:  Positive for arthralgias. Negative for back pain.  ?Skin:  Negative for color change and rash.  ?Neurological:  Negative for seizures and syncope.  ?All other systems reviewed and are negative. ? ?Physical Exam ?Updated Vital Signs ?BP 119/76   Pulse 77   Temp 98.6 ?F (37 ?C) (Oral)   Resp 13   SpO2 95%  ?Physical Exam ?Vitals and nursing note reviewed.  ?Constitutional:   ?   General: She is not in acute distress. ?  Appearance: She is well-developed.  ?HENT:  ?   Head: Normocephalic and atraumatic.  ?Eyes:  ?   Conjunctiva/sclera: Conjunctivae normal.  ?Cardiovascular:  ?   Rate and Rhythm: Normal rate and regular rhythm.  ?   Heart sounds: No murmur heard. ?Pulmonary:  ?   Effort: Pulmonary effort is normal. No respiratory distress.  ?   Breath sounds: Normal breath sounds.  ?Abdominal:  ?   Palpations: Abdomen is soft.  ?   Tenderness: There is no abdominal tenderness.  ?Musculoskeletal:     ?   General: No swelling, deformity or signs of injury.  ?   Cervical back: Neck supple.  ?Skin: ?   General: Skin is warm and dry.  ?   Capillary Refill: Capillary refill takes less than 2 seconds.  ?Neurological:  ?   General: No focal deficit present.  ?   Mental Status: She is alert.  ?Psychiatric:     ?   Mood and Affect: Mood normal.  ? ? ?ED Results / Procedures / Treatments   ?Labs ?(all labs ordered are listed, but only abnormal results are displayed) ?Labs Reviewed  ?CBC WITH DIFFERENTIAL/PLATELET - Abnormal; Notable for the following components:  ?    Result Value  ? WBC 13.6 (*)   ? Neutro Abs 11.5 (*)   ? All other components within normal limits  ?COMPREHENSIVE METABOLIC PANEL - Abnormal; Notable for the following components:  ? Sodium 131 (*)   ? Chloride 97 (*)   ? Glucose,  Bld 147 (*)   ? Creatinine, Ser 0.42 (*)   ? Calcium 8.7 (*)   ? All other components within normal limits  ?URINALYSIS, ROUTINE W REFLEX MICROSCOPIC - Abnormal; Notable for the following components:  ? L

## 2021-05-24 NOTE — ED Notes (Signed)
Patient transported to X-ray 

## 2021-05-24 NOTE — Progress Notes (Signed)
CSW sent out referrals for SNF. Providers have been informed.  ?

## 2021-05-24 NOTE — [place] FL2 (Signed)
?[place] MEDICA[place] FL2 LEVEL OF CARE SCREENING TOOL  ?  ? ?IDENTIFICATION  ?Patient Name: ?Katherine [name] Birthdate: 02/27/1945 Sex: female Admission Date (Current Location): ?05/24/2021  ?[place] ?[id number] S Facility and Address:  ?[facility],  [address], [place] [id number] ?     Provider Number: ?[id number]  ?Attending Physician Name and Address:  ?Charlynne PanderYao, David Hsienta, MD ? Relative Name and Phone Number:  ?Pura SpiceDonna Jones ?   ?Current Level of Care: ?SNF Recommended Level of Care: ?Skilled Nursing Facility Prior Approval Number: ?  ? ?Date Approved/Denied: ?  PASRR Number: ?[id number] A ? ?Discharge Plan: ?SNF ?  ? ?Current Diagnoses: ?Patient Active Problem List  ? Diagnosis Date Noted  ? Pelvic fracture (HCC) 04/23/2021  ? Hypocalcemia 04/23/2021  ? Coronary artery disease involving native coronary artery of native heart without angina pectoris 04/23/2021  ? Aortic atherosclerosis (HCC) 04/23/2021  ? Rotator cuff arthropathy of left shoulder 11/12/2019  ? 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 Type 2 diabetes mellitus with complication (HCC)   ? S/P reverse total shoulder arthroplasty, 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  ? MRSA infection 10/26/2014  ? Diabetic foot ulcer (HCC) 10/26/2014  ? Pre-ulcerative corn or callous 10/09/2013  ? Altered mental status 04/29/2013  ? Normocytic anemia 01/06/2013  ? 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  ? Thyroid nodule 11/07/2012  ? Severe aortic stenosis 05/27/2012  ? FRACTURE, PELVIS, RIGHT 04/15/2010  ? Fibromyalgia 05/10/2009  ? Anxiety state 07/31/2007  ? Osteoporosis 07/31/2007  ? Hyperlipidemia LDL goal <70 09/05/2006  ? DEPRESSION 09/05/2006  ? Essential hypertension 09/05/2006  ? Allergic rhinitis 09/05/2006  ? GERD 09/05/2006  ? DIVERTICULOSIS, COLON 09/05/2006  ? OSTEOARTHRITIS 09/05/2006  ? ? ?Orientation RESPIRATION BLADDER Height & Weight   ?  ?Self, Time, Situation, Place (Pt. is very  aware.) ? Normal Incontinent (Pt.can not walk to bathroom without assit) Weight:   ?Height:     ?BEHAVIORAL SYMPTOMS/MOOD NEUROLOGICAL BOWEL NUTRITION STATUS  ?    Incontinent (Pt. cannot go without assit)  (Heart Healthy)  ?AMBULATORY STATUS COMMUNICATION OF NEEDS Skin   ?Extensive Assist Verbally Other (Comment) (Bed sore on the Pt. Back) ?  ?  ?  ?    ?     ?     ? ? ?Personal Care Assistance Level of Assistance  ?Bathing, Feeding, Dressing, Total care Bathing Assistance: Maximum assistance ?Feeding assistance: Independent ?Dressing Assistance: Limited assistance ?Total Care Assistance: Limited assistance  ? ?Functional Limitations Info  ?Sight, Hearing, Speech Sight Info: Adequate ?Hearing Info: Adequate ?Speech Info: Adequate  ? ? ?SPECIAL CARE FACTORS FREQUENCY  ?    ?  ?  ?  ?  ?  ?  ?   ? ? ?Contractures Contractures Info: Not present  ? ? ?Additional Factors Info  ?Code Status, Allergies Code Status Info: Full ?Allergies Info: Penicillins PenicillinsPollen Extract  Coconut Oil Coconut Oil  Itching Latex Latex  RashMellaril (Thioridazine) Mellaril (Thioridazine) ?  ?  ?  ?   ? ?Current Medications (05/24/2021):  This is the current hospital active medication list ?Current Facility-Administered Medications  ?Medication Dose Route Frequency Provider Last Rate Last Admin  ? albuterol (VENTOLIN HFA) 108 (90 Base) MCG/ACT inhaler 2 puff  2 puff Inhalation BID PRN Milagros Lollykstra, Richard S, MD      ? aspirin EC tablet 81 mg  81 mg Oral Daily Milagros Lollykstra, Richard S, MD      ? gabapentin (NEURONTIN) capsule 100 mg  100 mg Oral BID Milagros Loll, MD      ? Melene Muller ON 05/25/2021] hydrochlorothiazide (MICROZIDE) capsule 12.5 mg  12.5 mg Oral q AM Milagros Loll, MD      ? hydrOXYzine (ATARAX) tablet 10 mg  10 mg Oral BID PRN Milagros Loll, MD      ? loratadine (CLARITIN) tablet 10 mg  10 mg Oral Daily Milagros Loll, MD      ? metFORMIN (GLUCOPHAGE) tablet 500 mg  500 mg Oral BID WC Milagros Loll, MD      ?  nitrofurantoin (macrocrystal-monohydrate) (MACROBID) capsule 100 mg  100 mg Oral BID Milagros Loll, MD      ? oxyCODONE (Oxy IR/ROXICODONE) immediate release tablet 5 mg  5 mg Oral Q4H PRN Milagros Loll, MD      ? pantoprazole (PROTONIX) EC tablet 40 mg  40 mg Oral Daily Milagros Loll, MD      ? risperiDONE (RISPERDAL) tablet 3 mg  3 mg Oral QHS Milagros Loll, MD      ? rosuvastatin (CRESTOR) tablet 40 mg  40 mg Oral q1800 Milagros Loll, MD      ? ?Current Outpatient Medications  ?Medication Sig Dispense Refill  ? acetaminophen (TYLENOL) 500 MG tablet Take 500 mg by mouth every 6 (six) hours as needed for mild pain or headache.    ? albuterol (VENTOLIN HFA) 108 (90 Base) MCG/ACT inhaler Inhale 2 puffs into the lungs 2 (two) times daily as needed for wheezing or shortness of breath.    ? alendronate (FOSAMAX) 70 MG tablet Take 70 mg by mouth once a week.    ? ascorbic acid (VITAMIN C) 500 MG tablet Take 500 mg by mouth daily.    ? aspirin EC 81 MG tablet Take 81 mg by mouth daily.     ? azithromycin (ZITHROMAX) 500 MG tablet Take 1 tablet (500 mg) one hour prior to all dental visits. (Patient taking differently: Take 500 mg by mouth See admin instructions. Take 500 mg by mouth one hour prior to all dental visits) 3 tablet 9  ? benztropine (COGENTIN) 0.5 MG tablet Take 0.5 mg by mouth 2 (two) times daily.    ? beta carotene 03009 UNIT capsule Take 25,000 Units by mouth daily.    ? calcium-vitamin D (OSCAL WITH D) 500-200 MG-UNIT tablet Take 1 tablet by mouth 3 (three) times daily. (Patient taking differently: Take 1 tablet by mouth in the morning and at bedtime.) 270 tablet 3  ? cetirizine (ZYRTEC) 10 MG tablet TAKE 1 TABLET BY MOUTH DAILY (Patient taking differently: Take 10 mg by mouth daily.) 30 tablet 0  ? diclofenac sodium (VOLTAREN) 1 % GEL Apply 2-4 g topically 4 (four) times daily as needed (for pain).    ? fluticasone (FLONASE) 50 MCG/ACT nasal spray Place 1 spray into both nostrils  daily.    ? gabapentin (NEURONTIN) 100 MG capsule Take 100-200 mg by mouth See admin instructions. Take 200 mg by mouth in the morning and 100 mg at bedtime    ? hydrochlorothiazide (MICROZIDE) 12.5 MG capsule Take 12.5 mg by mouth in the morning.    ? hydrOXYzine (ATARAX/VISTARIL) 10 MG tablet Take 1 tablet (10 mg total) by mouth 2 (two) times daily as needed. (Patient taking differently: Take 10 mg by mouth in the morning and at bedtime.) 30 tablet 11  ? ipratropium (ATROVENT) 0.06 % nasal spray USE 1 SPRAY IN EACH NOSTRIL THREE TIMES  DAILY (Patient taking differently: Place 1 spray into both nostrils 3 (three) times daily.) 30 mL 11  ? metFORMIN (GLUCOPHAGE) 500 MG tablet Take 500 mg by mouth in the morning and at bedtime.    ? Multiple Vitamin (MULTIVITAMIN WITH MINERALS) TABS tablet Take 1 tablet by mouth daily.    ? NAMZARIC 28-10 MG CP24 Take 1 capsule by mouth daily.    ? nitroGLYCERIN (NITROSTAT) 0.4 MG SL tablet DISSOLVE 1 TABLET UNDER THE TONGUE EVERY 5 MINUTES AS NEEDED FOR CHEST PAIN AS DIRECTED (Patient taking differently: Place 0.4 mg under the tongue every 5 (five) minutes as needed for chest pain.) 25 tablet 3  ? Omega-3 1000 MG CAPS Take 1,000 mg by mouth every Monday, Wednesday, and Friday.    ? ONETOUCH DELICA LANCETS 33G MISC CHECK BLOOD SUGAR ONCE DAILY AS DIRECTED 100 each 0  ? ONETOUCH VERIO test strip SMARTSIG:1 Each Via Meter Twice Daily    ? oxyCODONE (OXY IR/ROXICODONE) 5 MG immediate release tablet Take 1 tablet (5 mg total) by mouth every 4 (four) hours as needed for moderate pain. 30 tablet 0  ? pantoprazole (PROTONIX) 40 MG tablet TAKE 1 TABLET BY MOUTH DAILY (Patient taking differently: Take 40 mg by mouth at bedtime.) 90 tablet 0  ? Potassium Chloride ER 20 MEQ TBCR Take 20 mEq by mouth in the morning and at bedtime.  1  ? risperiDONE (RISPERDAL) 3 MG tablet Take 3 mg by mouth at bedtime.    ? rosuvastatin (CRESTOR) 40 MG tablet TAKE 1 TABLET(40 MG) BY MOUTH DAILY AT 6 PM (Patient  taking differently: Take 40 mg by mouth daily at 6 PM.) 90 tablet 3  ? sodium chloride (OCEAN) 0.65 % SOLN nasal spray Place 1 spray into both nostrils as needed for congestion.    ? UREA 20 INTENSIVE HYDRATING 2

## 2021-05-24 NOTE — Evaluation (Signed)
Occupational Therapy Evaluation ?Patient Details ?Name: Katherine [name] ?MRN: [id number] ?DOB: 12/04/1945 ?Today's Date: 05/24/2021 ? ? ?History of Present Illness 76 yo female brought to ED by family with family reporting pt unable to care for herself at home. Recent history of R sacral ala fx + pubic ramus fx 04/24/21. Pt was discharged to SNF for ST rehab. Per family report, pt was discharged from SNF and  has been home for ~10 days prior to coming to ED.  ? ?Clinical Impression ?  ?Patient is a 76 year old female who was noted to have been living alone prior to fall. Patient was noted to need mod A for bed mobility with increased time and cues for movements with min A for transfers. Patient was max A for LB dressing tasks and noted resting tremor in LUE. Patient and family reported having recently d/c from SNF with reports that they are unable to care for patient at current level at home. Patient reported that she does not feel like she is able to functional at home safely at this current level. Patient would continue to benefit from skilled OT services at this time while admitted and after d/c to address noted deficits in order to improve overall safety and independence in ADLs.  ?  ?   ? ?Recommendations for follow up therapy are one component of a multi-disciplinary discharge planning process, led by the attending physician.  Recommendations may be updated based on patient status, additional functional criteria and insurance authorization.  ? ?Follow Up Recommendations ? Skilled nursing-short term rehab (<3 hours/day)  ?  ?Assistance Recommended at Discharge Frequent or constant Supervision/Assistance  ?Patient can return home with the following A little help with walking and/or transfers;A little help with bathing/dressing/bathroom;Help with stairs or ramp for entrance;Assist for transportation;Direct supervision/assist for financial management;Assistance with cooking/housework;Direct supervision/assist for  medications management ? ?  ?Functional Status Assessment ? Patient has had a recent decline in their functional status and demonstrates the ability to make significant improvements in function in a reasonable and predictable amount of time.  ?Equipment Recommendations ? Other (comment) (defer to next venue)  ?  ?Recommendations for Other Services   ? ? ?  ?Precautions / Restrictions Precautions ?Precautions: Fall ?Restrictions ?Weight Bearing Restrictions: No ?Other Position/Activity Restrictions: WBAT  ? ?  ? ?Mobility Bed Mobility ?Overal bed mobility: Needs Assistance ?Bed Mobility: Supine to Sit, Sit to Supine ?  ?  ?Supine to sit: Mod assist, HOB elevated ?Sit to supine: Mod assist, HOB elevated ?  ?General bed mobility comments: Assist for trunk and bil LEs. Increased time. Cues required. Pt is fearful of falling ?  ? ?Transfers ?  ?  ?  ?  ?  ?  ?  ?  ?  ?  ?  ? ?  ?Balance Overall balance assessment: Needs assistance ?  ?Sitting balance-Leahy Scale: Fair ?  ?  ?Standing balance support: Bilateral upper extremity supported, During functional activity, Reliant on assistive device for balance ?Standing balance-Leahy Scale: Poor ?  ?  ?  ?  ?  ?  ?  ?  ?  ?  ?  ?  ?   ? ?ADL either performed or assessed with clinical judgement  ? ?ADL Overall ADL's : Needs assistance/impaired ?Eating/Feeding: Set up;Sitting ?  ?Grooming: Therapist, nutritionalWash/dry face;Set up;Sitting ?  ?Upper Body Bathing: Minimal assistance;Sitting ?  ?Lower Body Bathing: Sitting/lateral leans;Sit to/from stand;Moderate assistance ?  ?Upper Body Dressing : Sitting;Minimal assistance ?  ?Lower Body Dressing:  Sitting/lateral leans;Sit to/from stand;Maximal assistance ?  ?Toilet Transfer: Minimal assistance ?Toilet Transfer Details (indicate cue type and reason): patient was min A with RW for transfers and functional mobility in room with increased cues needed for safety. patient reproted pain is 3/10 ?Toileting- Clothing Manipulation and Hygiene: Moderate  assistance;Sit to/from stand ?  ?  ?  ?Functional mobility during ADLs: Minimal assistance;Rolling walker (2 wheels) ?   ? ? ? ?Vision Patient Visual Report: No change from baseline ?   ?   ?Perception   ?  ?Praxis   ?  ? ?Pertinent Vitals/Pain Pain Assessment ?Pain Assessment: 0-10 ?Pain Score: 3  ?Pain Location: R pelvic area ?Pain Descriptors / Indicators: Discomfort, Sore, Grimacing ?Pain Intervention(s): Limited activity within patient's tolerance, Monitored during session, Repositioned  ? ? ? ?Hand Dominance Right ?  ?Extremity/Trunk Assessment Upper Extremity Assessment ?Upper Extremity Assessment: Overall WFL for tasks assessed ?  ?Lower Extremity Assessment ?Lower Extremity Assessment: Defer to PT evaluation ?  ?Cervical / Trunk Assessment ?Cervical / Trunk Assessment: Normal ?  ?Communication Communication ?Communication: No difficulties ?  ?Cognition Arousal/Alertness: Awake/alert ?Behavior During Therapy: [facility] for tasks assessed/performed ?Overall Cognitive Status: History of cognitive impairments - at baseline ?  ?  ?  ?  ?  ?  ?  ?  ?  ?  ?  ?  ?  ?  ?  ?  ?General Comments: patient was noted to be at baseline for congitive impariment with family present to confirm. patient is quick to ask for others to complete tasks prior to putting forth effort. ?  ?  ?General Comments    ? ?  ?Exercises   ?  ?Shoulder Instructions    ? ? ?Home Living Family/patient expects to be discharged to:: Private residence ?Living Arrangements: Alone ?Available Help at Discharge: Family;Available PRN/intermittently ?Type of Home: Apartment ?Home Access: Stairs to enter ?Entrance Stairs-Number of Steps: 2 then walkway then steps ?Entrance Stairs-Rails: None ?Home Layout: One level ?  ?  ?Bathroom Shower/Tub: Tub/shower unit ?  ?Bathroom Toilet: Standard ?  ?  ?Home Equipment: Rollator (4 wheels);BSC/3in1;Shower seat;Cane - single point;Rolling Walker (2 wheels) ?  ?  ?  ? ?  ?Prior Functioning/Environment Prior Level of  Function : Needs assist ?  ?  ?  ?Physical Assist : Mobility (physical) ?  ?  ?Mobility Comments: uses walker-still painful ?ADLs Comments: needs assistance for bathing, dressing, IADLs. was independent prior to fall ?  ? ?  ?  ?OT Problem List: Decreased activity tolerance;Impaired balance (sitting and/or standing);Decreased knowledge of use of DME or AE;Decreased safety awareness;Pain ?  ?   ?OT Treatment/Interventions: Self-care/ADL training;Therapeutic exercise;Neuromuscular education;Energy conservation;DME and/or AE instruction;Patient/family education;Balance training;Therapeutic activities  ?  ?OT Goals(Current goals can be found in the care plan section) Acute Rehab OT Goals ?Patient Stated Goal: to get better ?OT Goal Formulation: With patient/family ?Time For Goal Achievement: 06/07/21 ?Potential to Achieve Goals: Good  ?OT Frequency: Min 2X/week ?  ? ?Co-evaluation PT/OT/SLP Co-Evaluation/Treatment: Yes ?Reason for Co-Treatment: To address functional/ADL transfers ?PT goals addressed during session: Mobility/safety with mobility ?OT goals addressed during session: ADL's and self-care ?  ? ?  ?AM-PAC OT "6 Clicks" Daily Activity     ?Outcome Measure Help from another person eating meals?: A Little ?Help from another person taking care of personal grooming?: A Little ?Help from another person toileting, which includes using toliet, bedpan, or urinal?: A Lot ?Help from another person bathing (including washing, rinsing, drying)?: A Lot ?Help  from another person to put on and taking off regular upper body clothing?: A Little ?Help from another person to put on and taking off regular lower body clothing?: A Lot ?6 Click Score: 15 ?  ?End of Session Equipment Utilized During Treatment: Rolling walker (2 wheels) ? ?Activity Tolerance: Patient tolerated treatment well ?Patient left: in bed;with call bell/phone within reach;with family/visitor present ? ?OT Visit Diagnosis: Unsteadiness on feet  (R26.81);Pain;History of falling (Z91.81)  ?              ?Time: 4562-5638 ?OT Time Calculation (min): 31 min ?Charges:  OT General Charges ?$OT Visit: 1 Visit ?OT Evaluation ?$OT Eval Moderate Complexity: 1 Mod ? ?Rovena Hearld OTR/L, M

## 2021-05-25 DIAGNOSIS — S32511A Fracture of superior rim of right pubis, initial encounter for closed fracture: Secondary | ICD-10-CM | POA: Diagnosis not present

## 2021-05-25 MED ORDER — NITROFURANTOIN MONOHYD MACRO 100 MG PO CAPS
100.0000 mg | ORAL_CAPSULE | Freq: Two times a day (BID) | ORAL | 0 refills | Status: AC
Start: 2021-05-25 — End: 2021-05-28

## 2021-05-25 NOTE — ED Notes (Signed)
PTAR called  

## 2021-05-25 NOTE — ED Notes (Signed)
Attempted to call report. Voice reached. Message left. ? ?

## 2021-05-25 NOTE — Progress Notes (Signed)
TOC CSW spoke with pt's niece to present bed offers, pt, and family choose [facility]. CSW to submit auth.  ? ? ? ?Auth pending. TOC to follow.  ? ? ?Valentina Shaggy.Grasiela Jonsson, MSW, LCSWA ?[facility]  Transitions of Care ?Clinical Social Worker I ?Direct Dial: [phone]  Fax: [phone] ?[email or web address]  ?

## 2021-05-25 NOTE — Progress Notes (Signed)
Occupational Therapy Treatment ?Patient Details ?Name: Katherine [name] ?MRN: [id number] ?DOB: July 01, 1945 ?Today's Date: 05/25/2021 ? ? ?History of present illness 76 yo female brought to ED by family with family reporting pt unable to care for herself at home. Recent history of R sacral ala fx + pubic ramus fx 04/24/21. Pt was discharged to SNF for ST rehab. Per family report, pt was discharged from SNF and  has been home for ~10 days prior to coming to ED. ?  ?OT comments ? Patient needing to use bedside commode upon arrival. Patient needs mod A for supine to sit and min A for steadying while taking steps to 3N1. Patient reliant on upper extremity support therefore total A for perianal care and clothing management. Patient tolerates static stand for peri care and donning clean foam sacral bandage. Patient returned to semi-supine needing mod A to lift legs onto gurney. Mod verbal cues throughout for safe hand placement during transfers.    ? ?Recommendations for follow up therapy are one component of a multi-disciplinary discharge planning process, led by the attending physician.  Recommendations may be updated based on patient status, additional functional criteria and insurance authorization. ?   ?Follow Up Recommendations ? Skilled nursing-short term rehab (<3 hours/day)  ?  ?Assistance Recommended at Discharge Frequent or constant Supervision/Assistance  ?Patient can return home with the following ? A little help with walking and/or transfers;A little help with bathing/dressing/bathroom;Help with stairs or ramp for entrance;Assist for transportation;Direct supervision/assist for financial management;Assistance with cooking/housework;Direct supervision/assist for medications management ?  ?Equipment Recommendations ? Other (comment) (defer next venue)  ?  ?   ?Precautions / Restrictions Precautions ?Precautions: Fall ?Restrictions ?Weight Bearing Restrictions: No  ? ? ?  ? ?Mobility Bed Mobility ?Overal bed  mobility: Needs Assistance ?Bed Mobility: Supine to Sit, Sit to Supine ?  ?  ?Supine to sit: Mod assist, HOB elevated ?Sit to supine: Mod assist, HOB elevated ?  ?General bed mobility comments: Patient needing assistance to upright trunk and bring legs to edge of bed. Patient needing assist to bring legs onto bed at end of session ?  ? ? ?  ?  ?Balance Overall balance assessment: Needs assistance ?Sitting-balance support: Feet supported ?Sitting balance-Leahy Scale: Fair ?  ?  ?Standing balance support: Reliant on assistive device for balance ?Standing balance-Leahy Scale: Poor ?  ?  ?  ?  ?  ?  ?  ?  ?  ?  ?  ?  ?   ? ?ADL either performed or assessed with clinical judgement  ? ?ADL Overall ADL's : Needs assistance/impaired ?  ?  ?  ?  ?  ?  ?  ?  ?  ?  ?  ?  ?Toilet Transfer: Minimal assistance;Stand-pivot;Cueing for safety;Rolling walker (2 wheels);BSC/3in1 ?Toilet Transfer Details (indicate cue type and reason): Patient needing mod cues for safe hand placement during sit <> stand not to pull on walker. min A for steadying ?Toileting- Clothing Manipulation and Hygiene: Total assistance;Sit to/from stand ?Toileting - Clothing Manipulation Details (indicate cue type and reason): To lower briefs and perform perianal care after bowel movement as patient is unsteady in standing. ?  ?  ?Functional mobility during ADLs: Minimal assistance;Cueing for safety;Cueing for sequencing;Rolling walker (2 wheels) ?  ?  ? ? ? ?Cognition Arousal/Alertness: Awake/alert ?Behavior During Therapy: [facility] for tasks assessed/performed ?Overall Cognitive Status: History of cognitive impairments - at baseline ?  ?  ?  ?  ?  ?  ?  ?  ?  ?  ?  ?  ?  ?  ?  ?  ?  General Comments: Patient is very pleasant and sweet "I love you, I love everyone" ?  ?  ?   ?   ?   ?   ? ? ?Pertinent Vitals/ Pain       Pain Assessment ?Pain Assessment: Faces ?Faces Pain Scale: Hurts even more ?Pain Location: R pelvic area ?Pain Descriptors / Indicators: Discomfort,  Sore, Grimacing ?Pain Intervention(s): Monitored during session ? ?   ?   ? ?Frequency ? Min 2X/week  ? ? ? ? ?  ?Progress Toward Goals ? ?OT Goals(current goals can now be found in the care plan section) ? Progress towards OT goals: Progressing toward goals ? ?Acute Rehab OT Goals ?Patient Stated Goal: Use toilet ?OT Goal Formulation: With patient ?Time For Goal Achievement: 06/07/21 ?Potential to Achieve Goals: Good ?ADL Goals ?Pt Will Perform Lower Body Dressing: with supervision;sitting/lateral leans;sit to/from stand ?Pt Will Transfer to Toilet: with supervision;ambulating;regular height toilet ?Pt Will Perform Toileting - Clothing Manipulation and hygiene: with supervision;with adaptive equipment;sitting/lateral leans;sit to/from stand  ?Plan Discharge plan remains appropriate   ? ?   ?AM-PAC OT "6 Clicks" Daily Activity     ?Outcome Measure ? ? Help from another person eating meals?: A Little ?Help from another person taking care of personal grooming?: A Little ?Help from another person toileting, which includes using toliet, bedpan, or urinal?: A Lot ?Help from another person bathing (including washing, rinsing, drying)?: A Lot ?Help from another person to put on and taking off regular upper body clothing?: A Little ?Help from another person to put on and taking off regular lower body clothing?: A Lot ?6 Click Score: 15 ? ?  ?End of Session Equipment Utilized During Treatment: Rolling walker (2 wheels) ? ?OT Visit Diagnosis: Unsteadiness on feet (R26.81);Pain;History of falling (Z91.81) ?Pain - Right/Left: Right ?Pain - part of body: Hip ?  ?Activity Tolerance Patient tolerated treatment well ?  ?Patient Left in bed;with call bell/phone within reach ?  ?Nurse Communication Mobility status ?  ? ?   ? ?Time: 3716-9678 ?OT Time Calculation (min): 26 min ? ?Charges: OT General Charges ?$OT Visit: 1 Visit ?OT Treatments ?$Self Care/Home Management : 23-37 mins ? ?Marlyce Huge OT ?OT pager: [phone] ? ? ?Carmelia Roller ?05/25/2021, 1:20 PM ?

## 2021-05-25 NOTE — ED Notes (Signed)
Pt had bowel movement. Assisted in changing of soiled brief and linens. PTAR awaiting completion for pt transfer home. ?

## 2021-05-25 NOTE — ED Provider Notes (Signed)
Emergency Medicine Observation Re-evaluation Note ? ?MARICEL SIRON is a 76 y.o. female, seen on rounds today.  Pt initially presented to the ED for complaints of Hip Pain and snf rehab ?Currently, the patient is resting in her bed. ? ?Physical Exam  ?BP 140/89   Pulse 82   Temp 98.6 ?F (37 ?C) (Oral)   Resp (!) 21   SpO2 98%  ?Physical Exam ?General: no acute distress ?Lungs: normal effort ?Psych: no psychosis ? ?ED Course / MDM  ?EKG:  ? ?I have reviewed the labs performed to date as well as medications administered while in observation.  Recent changes in the last 24 hours include home meds. ? ?Plan  ?Current plan is for discharge to facility. ? [facility] is not under involuntary commitment. ? ? ?  ?Sherwood Gambler, MD ?05/25/21 1415 ? ?

## 2021-05-25 NOTE — Progress Notes (Signed)
Pt's Josem Kaufmann was approved.  ? ?Pt to d/c to [facility] , Rm assignment 304 call report [phone].RN and MD made aware.  ? ?Arlie Solomons.Willaim Mode, MSW, [place] ?[place]  Transitions of Care ?Clinical Social Worker I ?Direct Dial: [phone]  Fax: [phone] ?[email or web address]  ?

## 2021-05-25 NOTE — ED Notes (Signed)
RN contacted [facility]. Currently waiting for the facility to call back to give report.  ?

## 2021-05-25 NOTE — ED Notes (Signed)
Breakfast tray given. °

## 2021-05-26 ENCOUNTER — Encounter: Payer: Self-pay | Admitting: Adult Health

## 2021-05-26 ENCOUNTER — Non-Acute Institutional Stay (SKILLED_NURSING_FACILITY): Payer: 59 | Admitting: Adult Health

## 2021-05-26 DIAGNOSIS — I10 Essential (primary) hypertension: Secondary | ICD-10-CM | POA: Diagnosis not present

## 2021-05-26 DIAGNOSIS — F419 Anxiety disorder, unspecified: Secondary | ICD-10-CM

## 2021-05-26 DIAGNOSIS — R251 Tremor, unspecified: Secondary | ICD-10-CM

## 2021-05-26 DIAGNOSIS — S32599S Other specified fracture of unspecified pubis, sequela: Secondary | ICD-10-CM

## 2021-05-26 DIAGNOSIS — E118 Type 2 diabetes mellitus with unspecified complications: Secondary | ICD-10-CM

## 2021-05-26 DIAGNOSIS — I2510 Atherosclerotic heart disease of native coronary artery without angina pectoris: Secondary | ICD-10-CM

## 2021-05-26 DIAGNOSIS — G629 Polyneuropathy, unspecified: Secondary | ICD-10-CM

## 2021-05-26 NOTE — Progress Notes (Addendum)
? ?Location:  [facility] ?Nursing Home Room Number: 304-A ?Place of Service:  SNF (31) ?Provider:  Durenda Age, DNP, FNP-BC ? ?Patient Care Team: ?Gwendel Hanson as PCP - General (Cardiology) ?Sherren Mocha, MD as PCP - Cardiology (Cardiology) ?Blanchie Serve, MD (Inactive) as Consulting Physician (Internal Medicine) ?Mcarthur Rossetti, MD as Consulting Physician (Orthopedic Surgery) ? ?Extended Emergency Contact Information ?Primary Emergency Contact: Duncan,Sherry ?Address: [address] ?         [place], [place] [address] [place] ?Home Phone: [phone] ?Mobile Phone: [phone] ?Relation: Sister ?Secondary Emergency Contact: Jones,Donna ? [place] ?Mobile Phone: [phone] ?Relation: Niece ? ?Code Status:  Full Code ? ?Goals of care: Advanced Directive information ? ?  06/03/2021  ? 11:38 AM  ?Advanced Directives  ?Does Patient Have a Medical Advance Directive? No  ?Would patient like information on creating a medical advance directive? No - Patient declined  ? ? ? ?Chief Complaint  ?Patient presents with  ? Hospitalization Follow-up  ? ? ?HPI:  ?Pt is a 76 y.o. female who was admitted to [facility] on 05/25/21 post ED visit 05/24/21 to 05/25/21. She had a recent pelvic fracture and unable to manage at home.  Urinalysis showed possible UTI and was a started on nitrofurantoin.  She was seen by PT and recommended rehabilitation. ? ?She was seen in the room today.  She was noted to have hand and facial tremors.  She stated that she tripped over a couch and fell at home that is why she had pelvic fracture.  She stated that the current date is April 1923 (it is currently April 2023). ? ?Past Medical History:  ?Diagnosis Date  ? ANXIETY 07/31/2007  ? Atrial fibrillation ([place])   ? a. per structural notes, previous hx remote Afib.  ? CAD (coronary artery disease)   ? a. 03/2019 orbital atherectomy/PCI with DES to RCA, prox Cx, and  mid Cx.  ? DEPRESSION 09/05/2006  ? DIABETES MELLITUS, TYPE II   ? Diabetic foot ulcer ([place]) 10/26/2014  ? DIVERTICULOSIS, COLON 09/05/2006  ? FIBROMYALGIA 05/10/2009  ? GERD 09/05/2006  ? HYPERLIPIDEMIA 09/05/2006  ? HYPERTENSION 09/05/2006  ? MRSA infection 10/26/2014  ? OSTEOARTHRITIS 09/05/2006  ? OSTEOPOROSIS 07/31/2007  ? 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 s/p TAVR with a 26 mm Edwards S3U via the TF approach by Drs Burt Knack and Roxy Manns  ? Severe aortic stenosis   ? s/p TAVR  ? Unspecified hearing loss 10/09/2007  ? ?Past Surgical History:  ?Procedure Laterality Date  ? ABDOMINAL HYSTERECTOMY  12/08  ? Bladder tac, partial hysterectomy  ? BREAST BIOPSY    ? CARDIAC CATHETERIZATION    ? catarct removal    ? both eyes  ? COLONOSCOPY    ? CORONARY ATHERECTOMY N/A 04/15/2019  ? Procedure: CORONARY ATHERECTOMY;  Surgeon: Sherren Mocha, MD;  Location: [facility];  Service: Cardiovascular;  Laterality: N/A;  ? CORONARY STENT INTERVENTION N/A 04/15/2019  ? Procedure: CORONARY STENT INTERVENTION;  Surgeon: Sherren Mocha, MD;  Location: [facility] [facility];  Service: Cardiovascular;  Laterality: N/A;  ? I & D KNEE WITH POLY EXCHANGE Left 01/03/2013  ? Procedure: IRRIGATION AND DEBRIDEMENT LEFT KNEE WITH POLY EXCHANGE;  Surgeon: Mcarthur Rossetti, MD;  [facility]: [facility];  Service: Orthopedics;  Laterality: Left;  ? inguinal herniorrhapy    ? right Dr. [name] 06/2009  ? REVERSE SHOULDER ARTHROPLASTY Right 09/07/2016  ? REVERSE SHOULDER  ARTHROPLASTY Right 09/07/2016  ? Procedure: REVERSE SHOULDER ARTHROPLASTY;  Surgeon: Tania Ade, MD;  Location: [facility];  Service: Orthopedics;  Laterality: Right;  Right reverse shoulder arthroplasty  ? RIGHT/LEFT HEART CATH AND CORONARY ANGIOGRAPHY N/A 09/11/2018  ? Procedure: RIGHT/LEFT HEART CATH AND CORONARY ANGIOGRAPHY;  Surgeon: Sherren Mocha, MD;  Location: [facility];  Service: Cardiovascular;  Laterality: N/A;  ? TEE WITHOUT CARDIOVERSION N/A 04/22/2019  ?  Procedure: TRANSESOPHAGEAL ECHOCARDIOGRAM (TEE);  Surgeon: Sherren Mocha, MD;  Location: [facility];  Service: Open Heart Surgery;  Laterality: N/A;  ? TONSILLECTOMY    ? TOTAL KNEE ARTHROPLASTY Left 12/20/2012  ? Procedure: LEFT TOTAL KNEE ARTHROPLASTY;  Surgeon: Mcarthur Rossetti, MD;  [facility];  Service: Orthopedics;  Laterality: Left;  ? TRANSCATHETER AORTIC VALVE REPLACEMENT, TRANSFEMORAL N/A 04/22/2019  ? Procedure: TRANSCATHETER AORTIC VALVE REPLACEMENT, TRANSFEMORAL;  Surgeon: Sherren Mocha, MD;  Location: [facility];  Service: Open Heart Surgery;  Laterality: N/A;  ? ? ?Allergies  ?Allergen Reactions  ? Penicillins Other (See Comments)  ?  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 ?PATIENT HAS HAD A PCN REACTION WITH IMMEDIATE RASH, FACIAL/TONGUE/THROAT SWELLING, SOB, OR LIGHTHEADEDNESS WITH HYPOTENSION:  #  #  #  YES  #  #  #   ?Has patient had a PCN reaction causing severe rash involving mucus membranes or skin necrosis: Unknown ?Has patient had a PCN reaction that required hospitalization: Unknown ?Has patient had a PCN reaction occurring within the last 10 years: Unknown ?  ? Pollen Extract Other (See Comments)  ?  UNSPECIFIED REACTION, but allergic   ? Coconut Oil Itching  ? Latex Rash  ? Mellaril [Thioridazine] Itching  ? ? ?Outpatient Encounter Medications as of 05/26/2021  ?Medication Sig  ? acetaminophen (TYLENOL) 500 MG tablet Take 500 mg by mouth every 6 (six) hours as needed for mild pain or headache.  ? albuterol (VENTOLIN HFA) 108 (90 Base) MCG/ACT inhaler Inhale 2 puffs into the lungs 2 (two) times daily.  ? alendronate (FOSAMAX) 70 MG tablet Take 70 mg by mouth every Monday.  ? ascorbic acid (VITAMIN C) 500 MG tablet Take 500 mg by mouth daily.  ? aspirin EC 81 MG tablet Take 81 mg by mouth every other day.  ? benztropine (COGENTIN) 0.5 MG tablet Take 0.5 mg by mouth in the morning and at bedtime.  ? bisacodyl (DULCOLAX) 10 MG suppository If no BM in 3 days, give  30 cc Milk of Magnesium p.o. x 1 dose in 24 hours as needed  ? calcium-vitamin D (OSCAL WITH D) 500-200 MG-UNIT tablet Take 1 tablet by mouth 3 (three) times daily.  ? cetirizine (ZYRTEC) 10 MG tablet TAKE 1 TABLET BY MOUTH DAILY (Patient taking differently: Take 10 mg by mouth daily.)  ? diclofenac sodium (VOLTAREN) 1 % GEL Apply 2-4 g topically 4 (four) times daily as needed (for pain).  ? fluticasone (FLONASE) 50 MCG/ACT nasal spray Place 1-2 sprays into both nostrils daily.  ? gabapentin (NEURONTIN) 100 MG capsule Take 100-200 mg by mouth See admin instructions. Take 200 mg by mouth in the morning and 100 mg at bedtime  ? hydrochlorothiazide (MICROZIDE) 12.5 MG capsule Take 12.5 mg by mouth in the morning.  ? hydrOXYzine (ATARAX/VISTARIL) 10 MG tablet Take 1 tablet (10 mg total) by mouth 2 (two) times daily as needed.  ? ipratropium (ATROVENT) 0.06 % nasal spray USE 1 SPRAY IN EACH NOSTRIL THREE TIMES DAILY  ? Magnesium Hydroxide (MILK OF  MAGNESIA PO) If no BM in 3 days, give 30 cc Milk of Magnesium p.o. x 1 dose in 24 hours as needed  ? metFORMIN (GLUCOPHAGE) 500 MG tablet Take 500 mg by mouth in the morning and at bedtime.  ? Multiple Vitamin (MULTIVITAMIN WITH MINERALS) TABS tablet Take 1 tablet by mouth daily with breakfast.  ? NAMZARIC 28-10 MG CP24 Take 1 capsule by mouth in the morning.  ? [EXPIRED] nitrofurantoin, macrocrystal-monohydrate, (MACROBID) 100 MG capsule Take 1 capsule (100 mg total) by mouth 2 (two) times daily for 3 days.  ? nitroGLYCERIN (NITROSTAT) 0.4 MG SL tablet DISSOLVE 1 TABLET UNDER THE TONGUE EVERY 5 MINUTES AS NEEDED FOR CHEST PAIN AS DIRECTED  ? Omega-3 1000 MG CAPS Take 1,000 mg by mouth every Monday, Wednesday, and Friday.  ? oxyCODONE (OXY IR/ROXICODONE) 5 MG immediate release tablet Take 1 tablet (5 mg total) by mouth every 4 (four) hours as needed for moderate pain.  ? pantoprazole (PROTONIX) 40 MG tablet TAKE 1 TABLET BY MOUTH DAILY  ? Potassium Chloride ER 20 MEQ TBCR Take  20 mEq by mouth in the morning and at bedtime.  ? risperiDONE (RISPERDAL) 3 MG tablet Take 3 mg by mouth at bedtime.  ? rosuvastatin (CRESTOR) 40 MG tablet TAKE 1 TABLET(40 MG) BY MOUTH DAILY AT 6 PM

## 2021-05-27 ENCOUNTER — Non-Acute Institutional Stay (SKILLED_NURSING_FACILITY): Payer: 59 | Admitting: Internal Medicine

## 2021-05-27 ENCOUNTER — Encounter: Payer: Self-pay | Admitting: Internal Medicine

## 2021-05-27 DIAGNOSIS — R29818 Other symptoms and signs involving the nervous system: Secondary | ICD-10-CM | POA: Diagnosis not present

## 2021-05-27 DIAGNOSIS — I10 Essential (primary) hypertension: Secondary | ICD-10-CM | POA: Diagnosis not present

## 2021-05-27 DIAGNOSIS — E118 Type 2 diabetes mellitus with unspecified complications: Secondary | ICD-10-CM | POA: Diagnosis not present

## 2021-05-27 DIAGNOSIS — S32414D Nondisplaced fracture of anterior wall of right acetabulum, subsequent encounter for fracture with routine healing: Secondary | ICD-10-CM

## 2021-05-27 DIAGNOSIS — R4189 Other symptoms and signs involving cognitive functions and awareness: Secondary | ICD-10-CM

## 2021-05-27 NOTE — Assessment & Plan Note (Addendum)
She could not give me the date of the original fall.  She states that she had fallen and "messed up my uterus".  She exhibited rambling, nonfocused discourse with religious focus intermittently.  Speech pattern is halting and reminiscent of that exhibited by the lead actor in the television series Gomer Pyle. She volunteered "I talk like my father". ?

## 2021-05-27 NOTE — Assessment & Plan Note (Signed)
BP controlled; no change in antihypertensive medications  

## 2021-05-27 NOTE — Patient Instructions (Signed)
See assessment and plan under each diagnosis in the problem list and acutely for this visit 

## 2021-05-27 NOTE — Progress Notes (Signed)
? ?NURSING HOME LOCATION:  [place] ?ROOM NUMBER:  304 A ? ?CODE STATUS:  Full Code ? ?PCP:  Isaias Cowman PA-C ? ?This is a comprehensive admission note to this SNFperformed on this date less than 30 days from date of admission. ?Included are preadmission medical/surgical history; reconciled medication list; family history; social history and comprehensive review of systems.  ?Corrections and additions to the records were documented. Comprehensive physical exam was also performed. Additionally a clinical summary was entered for each active diagnosis pertinent to this admission in the Problem List to enhance continuity of care. ? ?HPI: The patient presented to the ED 05/24/2021 specifically for rehab placement.  She had recently had a pelvic fracture and was unable to manage at home.  Specifically she had been hospitalized 3/25 - 04/25/2021 after mechanical/accidental fall at home while trying to ambulate with a walker.  Hip and pelvic imaging revealed no evidence of hip fractures but chronic pubic bone fractures were present.  CT of the pelvis revealed acute nondisplaced fractures involving the right superior pubic ramus with extension to involve the weightbearing surface of the acetabulum.  Chronic fractures were noted with residual deformity involving the bilateral superior and inferior pubic rami with extension to involve the pubic symphysis.  Aortic atherosclerosis was also documented.  CT of the head and CT of the cervical spine revealed no acute findings.  Dr. Ninfa Linden had not recommend any surgical intervention. ?Evaluation/25 included urinalysis which suggested possible UTI prompting initiation of nitrofurantoin.  Urinalysis revealed rare bacteria, 21-50 WBCs, and large leukocytes.  Nitrites were negative.  No urine C&S is found in Epic.  Creatinine was 0.42 with a GFR greater than 60 indicating CKD stage II.  Albumin was low normal at 3.5 and total protein normal at 6.8.  White  count was 13,600 with left shift.  No anemia was present. ?A1c had been performed 3/25 and was prediabetic at 5.5%. ?PT and OT evaluated the patient and did recommend rehab.  Social work was working on Haematologist. ? ?Past medical and surgical history: Includes history of AF, CAD, anxiety/depression, diverticulosis, fibromyalgia, GERD, dyslipidemia, essential hypertension, and diabetes complicated by wounds. ?Surgeries and procedures include hysterectomy, breast biopsy, colonoscopy, coronary atherectomy, coronary artery stenting, and transcatheter aortic valve replacement for severe aortic stenosis. ? ?Social history: Nondrinker; non-smoker. ? ?Family history: Reviewed, significant history of heart disease ?  ?Review of systems: Clinical neurocognitive deficits made validity of responses questionable ; preventing ROS completion.  Score was 8 out of 15 indicating significant deficits.  Date given as "October ?, 2023". She identified POTUS correctly; but she was unfocused and rambled somewhat nonsensically.  Right after she identified the [place]; she stated that she was an only child and "my mom did not want me".  She began to quote scripture, specifically Isaiah 12:53.  She informed me that she was watching the "Jesus channel" on TV.  She went on to talk about going to lunch here at the facility describing  what she had eaten and that she had had a bowel movement after the meal. ?She states that she had fallen but could not give me the date stating "pretty good while ago".  She stated that her friends had straightened up her apartment and she was walking too close to the couch leg and fell and "messed up my uterus". ?Speech therapy and PT/OT states that apparently family and friends would arrange 24-hour care at home but this could not be continued, prompting pursuing  placement. ?In reference to any GU symptoms; she described pain when she would go to the bathroom.  When asked where the pain was located; she  pointed to the hip rather than indicating dysuria. ? ?Physical exam:  ?Pertinent or positive findings: Hair is thin.  She has a dramatic enunciation pattern with halting speech and the "country" speech pattern exhibited by the actor playing [place] on TV.  Ptosis is present on the left.  The right nasolabial fold is slightly decreased compared to the right.  First and second heart sounds are accentuated.  Breath sounds are decreased.  Abdomen is protuberant.  The right dorsalis pedis pulses stronger than the other pulses.  She has trace edema at the sock line.  There is a resting tremor of the left hand.  There is an intention tremor of the right hand.  She has faint hyperpigmentation over the left shin.  She has severe interosseous wasting of the hands. ? ?General appearance:  no acute distress, increased work of breathing is present.   ?Lymphatic: No lymphadenopathy about the head, neck, axilla. ?Eyes: No conjunctival inflammation or lid edema is present. There is no scleral icterus. ?Ears:  External ear exam shows no significant lesions or deformities.   ?Nose:  External nasal examination shows no deformity or inflammation. Nasal mucosa are pink and moist without lesions, exudates ?Oral exam: Lips and gums are healthy appearing.There is no oropharyngeal erythema or exudate. ?Neck:  No thyromegaly, masses, tenderness noted.    ?Heart:  Normal rate and regular rhythm without gallop, murmur, click, rub.  ?Lungs:  without wheezes, rhonchi, rales, rubs. ?Abdomen: Bowel sounds are normal.  Abdomen is soft and nontender with no organomegaly, hernias, masses. ?GU: Deferred  ?Extremities:  No cyanosis, clubbing. ?Neurologic exam: Balance, Rhomberg, finger to nose testing could not be completed due to clinical state ?Skin: Warm & dry w/o tenting. ?No significant lesions or rash. ? ?See clinical summary under each active problem in the Problem List with associated updated therapeutic plan ? ?

## 2021-05-27 NOTE — Assessment & Plan Note (Signed)
Current A1c was 5.5%, indicating prediabetes.  Recorded glucoses are less than 150. ?

## 2021-05-27 NOTE — Assessment & Plan Note (Signed)
PT/OT at SNF as tolerated. ?

## 2021-06-01 ENCOUNTER — Encounter: Payer: 59 | Admitting: Orthopaedic Surgery

## 2021-06-03 ENCOUNTER — Encounter: Payer: Self-pay | Admitting: Adult Health

## 2021-06-03 ENCOUNTER — Other Ambulatory Visit: Payer: Self-pay | Admitting: Adult Health

## 2021-06-03 ENCOUNTER — Non-Acute Institutional Stay (SKILLED_NURSING_FACILITY): Payer: 59 | Admitting: Adult Health

## 2021-06-03 DIAGNOSIS — J3089 Other allergic rhinitis: Secondary | ICD-10-CM

## 2021-06-03 DIAGNOSIS — F419 Anxiety disorder, unspecified: Secondary | ICD-10-CM

## 2021-06-03 DIAGNOSIS — S32599S Other specified fracture of unspecified pubis, sequela: Secondary | ICD-10-CM

## 2021-06-03 DIAGNOSIS — I2510 Atherosclerotic heart disease of native coronary artery without angina pectoris: Secondary | ICD-10-CM

## 2021-06-03 DIAGNOSIS — K219 Gastro-esophageal reflux disease without esophagitis: Secondary | ICD-10-CM

## 2021-06-03 DIAGNOSIS — G629 Polyneuropathy, unspecified: Secondary | ICD-10-CM | POA: Diagnosis not present

## 2021-06-03 DIAGNOSIS — R251 Tremor, unspecified: Secondary | ICD-10-CM

## 2021-06-03 DIAGNOSIS — E118 Type 2 diabetes mellitus with unspecified complications: Secondary | ICD-10-CM

## 2021-06-03 DIAGNOSIS — R29818 Other symptoms and signs involving the nervous system: Secondary | ICD-10-CM

## 2021-06-03 DIAGNOSIS — I10 Essential (primary) hypertension: Secondary | ICD-10-CM

## 2021-06-03 DIAGNOSIS — M810 Age-related osteoporosis without current pathological fracture: Secondary | ICD-10-CM

## 2021-06-03 DIAGNOSIS — F28 Other psychotic disorder not due to a substance or known physiological condition: Secondary | ICD-10-CM

## 2021-06-03 DIAGNOSIS — R4189 Other symptoms and signs involving cognitive functions and awareness: Secondary | ICD-10-CM

## 2021-06-03 MED ORDER — NITROGLYCERIN 0.4 MG SL SUBL: SUBLINGUAL_TABLET | SUBLINGUAL | 0 refills | Status: AC

## 2021-06-03 MED ORDER — METFORMIN HCL 500 MG PO TABS: 500.0000 mg | ORAL_TABLET | Freq: Two times a day (BID) | ORAL | 0 refills | Status: AC

## 2021-06-03 MED ORDER — RISPERIDONE 3 MG PO TABS: 3.0000 mg | ORAL_TABLET | Freq: Every day | ORAL | 0 refills | Status: DC

## 2021-06-03 MED ORDER — DICLOFENAC SODIUM 1 % EX GEL: 2.0000 g | Freq: Four times a day (QID) | CUTANEOUS | 0 refills | Status: DC | PRN

## 2021-06-03 MED ORDER — HYDROCHLOROTHIAZIDE 12.5 MG PO CAPS: 12.5000 mg | ORAL_CAPSULE | Freq: Every morning | ORAL | 0 refills | Status: DC

## 2021-06-03 MED ORDER — ALBUTEROL SULFATE HFA 108 (90 BASE) MCG/ACT IN AERS: 2.0000 | INHALATION_SPRAY | Freq: Two times a day (BID) | RESPIRATORY_TRACT | 0 refills | Status: DC | PRN

## 2021-06-03 MED ORDER — NAMZARIC 28-10 MG PO CP24: 1.0000 | ORAL_CAPSULE | Freq: Every morning | ORAL | 0 refills | Status: AC

## 2021-06-03 MED ORDER — CETIRIZINE HCL 10 MG PO TABS: 10.0000 mg | ORAL_TABLET | Freq: Every day | ORAL | 0 refills | Status: DC

## 2021-06-03 MED ORDER — PANTOPRAZOLE SODIUM 40 MG PO TBEC: 40.0000 mg | DELAYED_RELEASE_TABLET | Freq: Every day | ORAL | 0 refills | Status: AC

## 2021-06-03 MED ORDER — FLUTICASONE PROPIONATE 50 MCG/ACT NA SUSP: 2.0000 | Freq: Every day | NASAL | 0 refills | Status: AC

## 2021-06-03 MED ORDER — ALENDRONATE SODIUM 70 MG PO TABS: 70.0000 mg | ORAL_TABLET | ORAL | 0 refills | Status: AC

## 2021-06-03 MED ORDER — IPRATROPIUM BROMIDE 0.06 % NA SOLN: NASAL | 0 refills | Status: AC

## 2021-06-03 MED ORDER — ASPIRIN EC 81 MG PO TBEC: 81.0000 mg | DELAYED_RELEASE_TABLET | ORAL | 0 refills | Status: AC

## 2021-06-03 MED ORDER — BENZTROPINE MESYLATE 0.5 MG PO TABS
0.5000 mg | ORAL_TABLET | Freq: Two times a day (BID) | ORAL | 0 refills | Status: AC
Start: 2021-06-03 — End: ?

## 2021-06-03 MED ORDER — HYDROXYZINE HCL 10 MG PO TABS: 10.0000 mg | ORAL_TABLET | Freq: Two times a day (BID) | ORAL | 0 refills | Status: DC | PRN

## 2021-06-03 MED ORDER — GABAPENTIN 100 MG PO CAPS: 200.0000 mg | ORAL_CAPSULE | Freq: Two times a day (BID) | ORAL | 0 refills | Status: AC

## 2021-06-03 MED ORDER — POTASSIUM CHLORIDE ER 20 MEQ PO TBCR: 20.0000 meq | EXTENDED_RELEASE_TABLET | Freq: Two times a day (BID) | ORAL | 0 refills | Status: AC

## 2021-06-03 MED ORDER — OXYCODONE HCL 5 MG PO TABS: 5.0000 mg | ORAL_TABLET | ORAL | 0 refills | Status: DC | PRN

## 2021-06-03 MED ORDER — ROSUVASTATIN CALCIUM 40 MG PO TABS: ORAL_TABLET | ORAL | 0 refills | Status: AC

## 2021-06-03 NOTE — Progress Notes (Signed)
This encounter was created in error - please disregard.

## 2021-06-03 NOTE — Progress Notes (Signed)
? ?Location:  [facility] ?Nursing Home Room Number: [id number] ?Place of Service:  SNF (31) ?Provider:  Kenard Gower, DNP, FNP-BC ? ?Patient Care Team: ?Ronnald Collum as PCP - General (Cardiology) ?[name], MD as PCP - Cardiology (Cardiology) ?Oneal Grout, MD (Inactive) as Consulting Physician (Internal Medicine) ?Kathryne Hitch, MD as Consulting Physician (Orthopedic Surgery) ? ?Extended Emergency Contact Information ?Primary Emergency Contact: Duncan,Sherry ?Address: [address] ?         [place], [place] [address] [place] ?Home Phone: [phone] ?Mobile Phone: [phone] ?Relation: Sister ?Secondary Emergency Contact: Jones,Donna ? [place] ?Mobile Phone: [phone] ?Relation: Niece ? ?Code Status:  FULL ? ?Goals of care: Advanced Directive information ? ?  06/03/2021  ? 11:38 AM  ?Advanced Directives  ?Does Patient Have a Medical Advance Directive? No  ?Would patient like information on creating a medical advance directive? No - Patient declined  ? ? ? ?Chief Complaint  ?Patient presents with  ? Discharge Note  ?  For discharge home on 06/05/21 with Home health PT and OT  ? ? ?HPI:  ?Pt is a 76 y.o. female who is for discharge home on 06/05/21 with Home health PT and OT. ? ?She was admitted to [facility] on 05/25/21 post ED visit on 05/24/21 to 05/25/21. She had a recent pelvic fracture and unable to manage at home. Urinalysis showed possible UTI and was started on Nitrofurantoin. ? ?Patient was admitted to this facility for short-term rehabilitation after the patient's recent hospitalization.  Patient has completed SNF rehabilitation and therapy has cleared the patient for discharge. ? ? ?Past Medical History:  ?Diagnosis Date  ? ANXIETY 07/31/2007  ? Atrial fibrillation ([facility])   ? a. per structural notes, previous hx remote Afib.  ? CAD (coronary artery disease)   ? a. 03/2019 orbital atherectomy/PCI with DES to RCA,  prox Cx, and mid Cx.  ? DEPRESSION 09/05/2006  ? DIABETES MELLITUS, TYPE II   ? Diabetic foot ulcer ([facility]) 10/26/2014  ? DIVERTICULOSIS, COLON 09/05/2006  ? FIBROMYALGIA 05/10/2009  ? GERD 09/05/2006  ? HYPERLIPIDEMIA 09/05/2006  ? HYPERTENSION 09/05/2006  ? MRSA infection 10/26/2014  ? OSTEOARTHRITIS 09/05/2006  ? OSTEOPOROSIS 07/31/2007  ? 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 s/p TAVR with a 26 mm Edwards S3U via the TF approach by Drs Excell Seltzer and Cornelius Moras  ? Severe aortic stenosis   ? s/p TAVR  ? Unspecified hearing loss 10/09/2007  ? ?Past Surgical History:  ?Procedure Laterality Date  ? ABDOMINAL HYSTERECTOMY  12/08  ? Bladder tac, partial hysterectomy  ? BREAST BIOPSY    ? CARDIAC CATHETERIZATION    ? catarct removal    ? both eyes  ? COLONOSCOPY    ? CORONARY ATHERECTOMY N/A 04/15/2019  ? Procedure: CORONARY ATHERECTOMY;  Surgeon: [name], MD;  Location[facility];  Service: Cardiovascular;  Laterality: N/A;  ? CORONARY STENT INTERVENTION N/A 04/15/2019  ? Procedure: CORONARY STENT INTERVENTION;  Surgeon: [name], MD;  Location: [facility] INVASIVE CV LAB;  Service: Cardiovascular;  Laterality: N/A;  ? I & D KNEE WITH POLY EXCHANGE Left 01/03/2013  ? Procedure: IRRIGATION AND DEBRIDEMENT LEFT KNEE WITH POLY EXCHANGE;  Surgeon: Kathryne Hitch, MD;  [facility];  Service: Orthopedics;  Laterality: Left;  ? inguinal herniorrhapy    ? right Dr. Freida Busman 06/2009  ? REVERSE SHOULDER ARTHROPLASTY Right 09/07/2016  ? REVERSE SHOULDER ARTHROPLASTY Right 09/07/2016  ? Procedure:  REVERSE SHOULDER ARTHROPLASTY;  Surgeon: Jones Broomhandler, Justin, MD;  [facility];  Service: Orthopedics;  Laterality: Right;  Right reverse shoulder arthroplasty  ? RIGHT/LEFT HEART CATH AND CORONARY ANGIOGRAPHY N/A 09/11/2018  ? Procedure: RIGHT/LEFT HEART CATH AND CORONARY ANGIOGRAPHY;  Surgeon: Tonny Bollmanooper, Michael, MD;  [facility];  Service: Cardiovascular;  Laterality: N/A;  ? TEE WITHOUT CARDIOVERSION N/A  04/22/2019  ? Procedure: TRANSESOPHAGEAL ECHOCARDIOGRAM (TEE);  Surgeon: Tonny Bollmanooper, Michael, MD;  [facility];  Service: Open Heart Surgery;  Laterality: N/A;  ? TONSILLECTOMY    ? TOTAL KNEE ARTHROPLASTY Left 12/20/2012  ? Procedure: LEFT TOTAL KNEE ARTHROPLASTY;  Surgeon: Kathryne Hitchhristopher Y Blackman, MD;  [facility];  Service: Orthopedics;  Laterality: Left;  ? TRANSCATHETER AORTIC VALVE REPLACEMENT, TRANSFEMORAL N/A 04/22/2019  ? Procedure: TRANSCATHETER AORTIC VALVE REPLACEMENT, TRANSFEMORAL;  Surgeon: Tonny Bollmanooper, Michael, MD;  [facility];  Service: Open Heart Surgery;  Laterality: N/A;  ? ? ?Allergies  ?Allergen Reactions  ? Penicillins Other (See Comments)  ?  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 ?PATIENT HAS HAD A PCN REACTION WITH IMMEDIATE RASH, FACIAL/TONGUE/THROAT SWELLING, SOB, OR LIGHTHEADEDNESS WITH HYPOTENSION:  #  #  #  YES  #  #  #   ?Has patient had a PCN reaction causing severe rash involving mucus membranes or skin necrosis: Unknown ?Has patient had a PCN reaction that required hospitalization: Unknown ?Has patient had a PCN reaction occurring within the last 10 years: Unknown ?  ? Pollen Extract Other (See Comments)  ?  UNSPECIFIED REACTION, but allergic   ? Coconut Oil Itching  ? Latex Rash  ? Mellaril [Thioridazine] Itching  ? ? ?Outpatient Encounter Medications as of 06/03/2021  ?Medication Sig  ? acetaminophen (TYLENOL) 500 MG tablet Take 500 mg by mouth every 6 (six) hours as needed for mild pain or headache.  ? albuterol (VENTOLIN HFA) 108 (90 Base) MCG/ACT inhaler Inhale 2 puffs into the lungs 2 (two) times daily.  ? alendronate (FOSAMAX) 70 MG tablet Take 70 mg by mouth every Monday.  ? ascorbic acid (VITAMIN C) 500 MG tablet Take 500 mg by mouth daily.  ? aspirin EC 81 MG tablet Take 81 mg by mouth every other day.  ? azithromycin (ZITHROMAX) 500 MG tablet Take 1 tablet (500 mg) one hour prior to all dental visits. (Patient not taking: Reported on 05/26/2021)  ?  benztropine (COGENTIN) 0.5 MG tablet Take 0.5 mg by mouth in the morning and at bedtime.  ? bisacodyl (DULCOLAX) 10 MG suppository If no BM in 3 days, give 30 cc Milk of Magnesium p.o. x 1 dose in 24 hours as needed  ? calcium-vitamin D (OSCAL WITH D) 500-200 MG-UNIT tablet Take 1 tablet by mouth 3 (three) times daily.  ? cetirizine (ZYRTEC) 10 MG tablet TAKE 1 TABLET BY MOUTH DAILY (Patient taking differently: Take 10 mg by mouth daily.)  ? diclofenac sodium (VOLTAREN) 1 % GEL Apply 2-4 g topically 4 (four) times daily as needed (for pain).  ? fluticasone (FLONASE) 50 MCG/ACT nasal spray Place 1-2 sprays into both nostrils daily.  ? gabapentin (NEURONTIN) 100 MG capsule Take 100-200 mg by mouth See admin instructions. Take 200 mg by mouth in the morning and 100 mg at bedtime  ? hydrochlorothiazide (MICROZIDE) 12.5 MG capsule Take 12.5 mg by mouth in the morning.  ? hydrOXYzine (ATARAX/VISTARIL) 10 MG tablet Take 1 tablet (10 mg total) by mouth 2 (two) times daily as needed.  ? ipratropium (ATROVENT)  0.06 % nasal spray USE 1 SPRAY IN EACH NOSTRIL THREE TIMES DAILY  ? Magnesium Hydroxide (MILK OF MAGNESIA PO) If no BM in 3 days, give 30 cc Milk of Magnesium p.o. x 1 dose in 24 hours as needed  ? metFORMIN (GLUCOPHAGE) 500 MG tablet Take 500 mg by mouth in the morning and at bedtime.  ? Multiple Vitamin (MULTIVITAMIN WITH MINERALS) TABS tablet Take 1 tablet by mouth daily with breakfast.  ? NAMZARIC 28-10 MG CP24 Take 1 capsule by mouth in the morning.  ? nitroGLYCERIN (NITROSTAT) 0.4 MG SL tablet DISSOLVE 1 TABLET UNDER THE TONGUE EVERY 5 MINUTES AS NEEDED FOR CHEST PAIN AS DIRECTED  ? Omega-3 1000 MG CAPS Take 1,000 mg by mouth every Monday, Wednesday, and Friday.  ? ONETOUCH DELICA LANCETS 33G MISC CHECK BLOOD SUGAR ONCE DAILY AS DIRECTED (Patient not taking: Reported on 05/26/2021)  ? ONETOUCH VERIO test strip SMARTSIG:1 Each Via Meter Twice Daily (Patient not taking: Reported on 05/26/2021)  ? oxyCODONE (OXY  IR/ROXICODONE) 5 MG immediate release tablet Take 1 tablet (5 mg total) by mouth every 4 (four) hours as needed for moderate pain.  ? pantoprazole (PROTONIX) 40 MG tablet TAKE 1 TABLET BY MOUTH DAILY  ?

## 2021-06-08 ENCOUNTER — Encounter: Payer: 59 | Admitting: Orthopaedic Surgery

## 2021-06-15 ENCOUNTER — Encounter: Payer: Self-pay | Admitting: Adult Health

## 2021-06-15 ENCOUNTER — Non-Acute Institutional Stay (SKILLED_NURSING_FACILITY): Payer: 59 | Admitting: Adult Health

## 2021-06-15 DIAGNOSIS — R251 Tremor, unspecified: Secondary | ICD-10-CM

## 2021-06-15 DIAGNOSIS — I10 Essential (primary) hypertension: Secondary | ICD-10-CM

## 2021-06-15 DIAGNOSIS — E118 Type 2 diabetes mellitus with unspecified complications: Secondary | ICD-10-CM | POA: Diagnosis not present

## 2021-06-15 DIAGNOSIS — G629 Polyneuropathy, unspecified: Secondary | ICD-10-CM

## 2021-06-15 DIAGNOSIS — R4189 Other symptoms and signs involving cognitive functions and awareness: Secondary | ICD-10-CM

## 2021-06-15 DIAGNOSIS — F28 Other psychotic disorder not due to a substance or known physiological condition: Secondary | ICD-10-CM

## 2021-06-15 DIAGNOSIS — R29818 Other symptoms and signs involving the nervous system: Secondary | ICD-10-CM

## 2021-06-15 DIAGNOSIS — S32599S Other specified fracture of unspecified pubis, sequela: Secondary | ICD-10-CM

## 2021-06-15 NOTE — Progress Notes (Signed)
Location:  [facility] Room Number: 304-B Place of Service:  SNF (31) Provider:  Kenard Gower, DNP, FNP-BC  Patient Care Team: Ronnald Collum as PCP - General (Cardiology) [name], MD as PCP - Cardiology (Cardiology) Oneal Grout, MD (Inactive) as Consulting Physician (Internal Medicine) Kathryne Hitch, MD as Consulting Physician (Orthopedic Surgery)  Extended Emergency Contact Information Primary Emergency Contact: [facility] Address: [address] [place], [place] [address] [place] of [place] Home Phone: [phone] Mobile Phone: [phone] Relation: Sister Secondary Emergency Contact: Antonieta Pert [place] of [place] Mobile Phone: [phone] Relation: Niece  Code Status:  Full Code  Goals of care: Advanced Directive information    06/03/2021   11:38 AM  Advanced Directives  Does Patient Have a Medical Advance Directive? No  Would patient like information on creating a medical advance directive? No - Patient declined     Chief Complaint  Patient presents with   Acute Visit    Short term Rehabilitation     HPI:  Pt is a 76 y.o. female seen today for a short-term care visit. She is currently having PT, OT and ST.  Type 2 diabetes mellitus with complication (HCC)  -  CBGs ranging from 104 to 180, takes metformin 500 mg twice a day  Essential hypertension  -  SBPs ranging from 113-127, with outlier 154.  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 per structural notes, previous hx remote Afib.   CAD (coronary artery disease)    a. 03/2019 orbital atherectomy/PCI with DES to RCA, prox Cx, and mid Cx.   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   s/p TAVR with a 26 mm Edwards S3U via the TF approach by Drs Excell Seltzer and Cornelius Moras   Severe aortic stenosis    s/p TAVR   Unspecified hearing loss 10/09/2007   Past Surgical History:  Procedure Laterality Date   ABDOMINAL HYSTERECTOMY  12/08   Bladder tac, partial hysterectomy   BREAST BIOPSY     CARDIAC CATHETERIZATION     catarct removal     both eyes   COLONOSCOPY     CORONARY ATHERECTOMY N/A 04/15/2019   Procedure: CORONARY ATHERECTOMY;  Surgeon: [name], MD;  Location: [facility] INVASIVE CV LAB;  Service: Cardiovascular;  Laterality: N/A;   CORONARY STENT INTERVENTION N/A 04/15/2019   Procedure: CORONARY STENT INTERVENTION;  Surgeon: [name], MD;  Location: [facility] INVASIVE CV LAB;  Service: Cardiovascular;  Laterality: N/A;   I & D KNEE WITH POLY EXCHANGE Left 01/03/2013   Procedure: IRRIGATION AND DEBRIDEMENT LEFT KNEE WITH POLY EXCHANGE;  Surgeon: Mcarthur Rossetti, MD;  Location: [facility];  Service: Orthopedics;  Laterality: Left;   inguinal herniorrhapy     right Dr. [name] 06/2009   REVERSE SHOULDER ARTHROPLASTY Right 09/07/2016   REVERSE SHOULDER ARTHROPLASTY Right 09/07/2016   Procedure: REVERSE SHOULDER ARTHROPLASTY;  Surgeon: Tania Ade, MD;  Location: [facility];  Service: Orthopedics;  Laterality: Right;  Right reverse shoulder arthroplasty   RIGHT/LEFT HEART CATH AND CORONARY ANGIOGRAPHY N/A 09/11/2018   Procedure: RIGHT/LEFT HEART CATH AND  CORONARY ANGIOGRAPHY;  Surgeon: Sherren Mocha, MD;  Location: [place] CV LAB;  Service: Cardiovascular;  Laterality: N/A;   TEE WITHOUT CARDIOVERSION N/A 04/22/2019   Procedure: TRANSESOPHAGEAL ECHOCARDIOGRAM (TEE);  Surgeon: Sherren Mocha, MD;  Location: [place] CV LAB;  Service: Open Heart Surgery;  Laterality: N/A;   TONSILLECTOMY     TOTAL KNEE ARTHROPLASTY Left 12/20/2012   Procedure: LEFT TOTAL KNEE ARTHROPLASTY;  Surgeon: Mcarthur Rossetti, MD;  Location: [facility];  Service: Orthopedics;  Laterality: Left;   TRANSCATHETER AORTIC VALVE REPLACEMENT, TRANSFEMORAL N/A 04/22/2019   Procedure: TRANSCATHETER AORTIC VALVE REPLACEMENT, TRANSFEMORAL;  Surgeon: Sherren Mocha, MD;  Location: [place] CV LAB;  Service: Open Heart Surgery;  Laterality: N/A;    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 PATIENT HAS HAD A PCN REACTION WITH IMMEDIATE RASH, FACIAL/TONGUE/THROAT SWELLING, SOB, OR LIGHTHEADEDNESS WITH HYPOTENSION:  #  #  #  YES  #  #  #   Has patient had a PCN reaction causing severe rash involving mucus membranes or skin necrosis: Unknown Has patient had a PCN reaction that required hospitalization: Unknown Has patient had a PCN reaction occurring within the last 10 years: Unknown    Pollen Extract Other (See Comments)    UNSPECIFIED REACTION, but allergic    Coconut (Cocos Nucifera) Itching   Latex Rash   Mellaril [Thioridazine] Itching    Outpatient Encounter Medications as of 06/15/2021  Medication Sig   acetaminophen (TYLENOL) 500 MG tablet Take 500 mg by mouth every 6 (six) hours as needed for mild pain or headache.   albuterol (VENTOLIN HFA) 108 (90 Base) MCG/ACT inhaler Inhale 2 puffs into the lungs 2 (two) times daily as needed for wheezing or shortness of breath.   alendronate (FOSAMAX) 70 MG tablet Take 1 tablet (70 mg total) by mouth every Monday.   Amino Acids-Protein Hydrolys (PRO-STAT)  LIQD 30 ML TWICE A DAY TO PROMOTE WOUND HEALING   ascorbic acid (VITAMIN C) 500 MG tablet Take 500 mg by mouth daily.   aspirin EC 81 MG tablet Take 1 tablet (81 mg total) by mouth every other day.   benztropine (COGENTIN) 0.5 MG tablet Take 1 tablet (0.5 mg total) by mouth in the morning and at bedtime.   bisacodyl (DULCOLAX) 10 MG suppository If no BM in 3 days, give 30 cc Milk of Magnesium p.o. x 1 dose in 24 hours as needed   calcium-vitamin D (OSCAL WITH D) 500-200 MG-UNIT tablet Take 1 tablet by mouth 3 (three) times daily.   cetirizine (ZYRTEC) 10 MG tablet Take 1 tablet (10 mg total) by mouth daily.   diclofenac Sodium (VOLTAREN) 1 % GEL Apply 2 g topically 4 (four) times daily as needed.   fluticasone (FLONASE) 50 MCG/ACT nasal spray Place 2 sprays into both nostrils daily.   gabapentin (NEURONTIN) 100 MG capsule Take 2 capsules (200 mg total) by mouth 2 (two) times daily. Take 200 mg by  mouth in the morning and 100 mg at bedtime   hydrochlorothiazide (MICROZIDE) 12.5 MG capsule Take 1 capsule (12.5 mg total) by mouth in the morning.   ipratropium (ATROVENT) 0.06 % nasal spray USE 1 SPRAY IN EACH NOSTRIL THREE TIMES DAILY   Magnesium Hydroxide (MILK OF MAGNESIA PO) If no BM in 3 days, give 30 cc Milk of Magnesium p.o. x 1 dose in 24 hours as needed   metFORMIN (GLUCOPHAGE) 500 MG tablet Take 1 tablet (500 mg total) by mouth in the morning and at bedtime.   Multiple Vitamin (MULTIVITAMIN WITH MINERALS) TABS tablet Take 1 tablet by mouth daily with breakfast.   NAMZARIC 28-10 MG CP24 Take 1 capsule by mouth in the morning.   nitroGLYCERIN (NITROSTAT) 0.4 MG SL tablet DISSOLVE 1 TABLET UNDER THE TONGUE EVERY 5 MINUTES AS NEEDED FOR CHEST PAIN AS DIRECTED   NON FORMULARY Diet:Heart Healthy/CCD/thin liquids   Omega-3 1000 MG CAPS Take 1,000 mg by mouth every Monday, Wednesday, and Friday.   oxyCODONE (OXY IR/ROXICODONE) 5 MG immediate release tablet Take 1 tablet (5 mg total) by mouth every 4  (four) hours as needed for moderate pain.   pantoprazole (PROTONIX) 40 MG tablet Take 1 tablet (40 mg total) by mouth daily.   Potassium Chloride ER 20 MEQ TBCR Take 20 mEq by mouth in the morning and at bedtime.   risperiDONE (RISPERDAL) 3 MG tablet Take 1 tablet (3 mg total) by mouth at bedtime.   rosuvastatin (CRESTOR) 40 MG tablet Take 1 tablet orally daily at 6 PM.   sodium chloride (OCEAN) 0.65 % SOLN nasal spray Place 1 spray into both nostrils as needed for congestion.   Sodium Phosphates (RA SALINE ENEMA RE) If not relieved by Biscodyl suppository, give disposable Saline Enema rectally X 1 dose/24 hrs as needed   azithromycin (ZITHROMAX) 500 MG tablet Take 1 tablet (500 mg) one hour prior to all dental visits. (Patient not taking: Reported on 05/26/2021)   hydrOXYzine (ATARAX) 10 MG tablet Take 1 tablet (10 mg total) by mouth 2 (two) times daily as needed. (Patient not taking: Reported on 06/15/2021)   ONETOUCH DELICA LANCETS 33G MISC CHECK BLOOD SUGAR ONCE DAILY AS DIRECTED (Patient not taking: Reported on 05/26/2021)   ONETOUCH VERIO test strip SMARTSIG:1 Each Via Meter Twice Daily (Patient not taking: Reported on 05/26/2021)   UREA 20 INTENSIVE HYDRATING 20 % cream Apply 1 application. topically daily as needed (for dry skin). (Patient not taking: Reported on 05/26/2021)   No facility-administered encounter medications on file as of 06/15/2021.    Review of Systems  Constitutional:  Negative for appetite change, chills, fatigue and fever.  HENT:  Negative for congestion, hearing loss, rhinorrhea and sore throat.   Eyes: Negative.   Respiratory:  Negative for cough, shortness of breath and wheezing.   Cardiovascular:  Negative for chest pain, palpitations and leg swelling.  Gastrointestinal:  Negative for abdominal pain, constipation, diarrhea, nausea and vomiting.  Genitourinary:  Negative for dysuria.  Musculoskeletal:  Negative for arthralgias, back pain and myalgias.  Skin:   Negative for color change, rash and wound.  Neurological:  Negative for dizziness, weakness and headaches.  Psychiatric/Behavioral:  Negative for behavioral problems. The patient is not nervous/anxious.       Immunization History  Administered Date(s) Administered   Influenza Split 10/12/2010, 11/07/2011   Influenza Whole 11/30/2005, 10/24/2007, 10/27/2008   Influenza, High Dose Seasonal PF 11/07/2012, 12/11/2013   Influenza-Unspecified 11/28/2014   Pneumococcal Conjugate-13 12/04/2012  Pneumococcal Polysaccharide-23 01/27/2008   Td 01/27/2008   Tdap 05/18/2016   Zoster, Live 05/09/2012   Pertinent  Health Maintenance Due  Topic Date Due   OPHTHALMOLOGY EXAM  03/11/2016   URINE MICROALBUMIN  06/14/2016   COLONOSCOPY (Pts 45-65yrs Insurance coverage will need to be confirmed)  06/12/2021   INFLUENZA VACCINE  08/30/2021   HEMOGLOBIN A1C  10/24/2021   FOOT EXAM  12/29/2021   DEXA SCAN  Completed      04/24/2021   10:43 PM 04/25/2021   12:30 PM 04/25/2021    8:20 PM 05/24/2021    1:48 PM 05/25/2021   10:28 AM  Fall Risk  Patient Fall Risk Level High fall risk High fall risk High fall risk High fall risk High fall risk     Vitals:   06/15/21 1540  BP: (!) 143/83  Pulse: 76  Resp: 18  Temp: (!) 97.5 F (36.4 C)  SpO2: 98%  Weight: 123 lb 6.4 oz (56 kg)  Height: 4\' 11"  (1.499 m)   Body mass index is 24.92 kg/m.  Physical Exam Constitutional:      Appearance: Normal appearance.  HENT:     Head: Normocephalic and atraumatic.     Nose: Nose normal.     Mouth/Throat:     Mouth: Mucous membranes are moist.  Eyes:     Conjunctiva/sclera: Conjunctivae normal.  Cardiovascular:     Rate and Rhythm: Normal rate and regular rhythm.  Pulmonary:     Effort: Pulmonary effort is normal.     Breath sounds: Normal breath sounds.  Abdominal:     General: Bowel sounds are normal.     Palpations: Abdomen is soft.  Musculoskeletal:        General: Normal range of motion.      Cervical back: Normal range of motion.  Skin:    General: Skin is warm and dry.  Neurological:     General: No focal deficit present.     Mental Status: She is alert and oriented to person, place, and time.  Psychiatric:        Mood and Affect: Mood normal.        Behavior: Behavior normal.        Thought Content: Thought content normal.        Judgment: Judgment normal.       Labs reviewed: Recent Labs    04/23/21 1427 04/24/21 0739 05/24/21 1420  NA 137 140 131*  K 3.2* 3.8 4.2  CL 104 105 97*  CO2 25 25 24   GLUCOSE 138* 134* 147*  BUN 26* 36* 16  CREATININE 0.45 0.60 0.42*  CALCIUM 8.7* 8.7* 8.7*  MG 1.9  --   --   PHOS 2.7  --   --    Recent Labs    04/23/21 1427 05/24/21 1420  AST 49* 34  ALT 28 33  ALKPHOS 43 113  BILITOT 1.5* 0.5  PROT 6.7 6.8  ALBUMIN 4.0 3.5   Recent Labs    04/23/21 1427 04/24/21 0739 04/25/21 0345 05/24/21 1420  WBC 16.5* 11.8* 8.1 13.6*  NEUTROABS 14.9*  --   --  11.5*  HGB 12.3 11.5* 9.9* 12.2  HCT 35.9* 34.6* 30.7* 37.7  MCV 92.5 95.1 98.7 94.7  PLT 183 167 165 303   Lab Results  Component Value Date   TSH 0.61 06/15/2015   Lab Results  Component Value Date   HGBA1C 5.5 04/23/2021   Lab Results  Component Value Date  CHOL 122 10/27/2019   HDL 50 10/27/2019   LDLCALC 52 10/27/2019   LDLDIRECT 66.0 06/16/2014   TRIG 113 10/27/2019   CHOLHDL 2.4 10/27/2019    Significant Diagnostic Results in last 30 days:  DG Chest 1 View  Result Date: 05/24/2021 CLINICAL DATA:  Pelvic pain, difficulty with ambulation. Recent pelvic fracture, discharged from rehab 10 days ago, unable to care for self. Placement. EXAM: CHEST  1 VIEW COMPARISON:  Radiograph 04/23/2021 FINDINGS: Persistent low lung volumes. Stable heart size and mediastinal contours, prior TAVR. Bandlike linear atelectasis or scarring in the left mid lung. No new airspace disease. No pulmonary edema, pleural effusion, or pneumothorax. Reverse right shoulder  arthroplasty. Remote left rib fractures. IMPRESSION: 1. No acute abnormality. 2. Persistent low lung volumes and left mid lung atelectasis or scarring. Electronically Signed   By: Keith Rake M.D.   On: 05/24/2021 15:46   DG Pelvis 1-2 Views  Result Date: 05/24/2021 CLINICAL DATA:  pelvic pain, difficulty with mobility Pelvic fracture 1 month ago, discharged from rehab 10 days ago. Unable to care for self. Placement. EXAM: PELVIS - 1-2 VIEW COMPARISON:  Pelvis radiograph 05/11/2021, pelvis and right hip radiograph is well as pelvic CT 04/23/2021 FINDINGS: Chronic bilateral superior and inferior pubic rami fractures with deformity the pubic symphysis. This is stable in appearance from prior exam. The acute right superior pubic ramus fracture as well as right sacral fracture on prior CT are not well visualized by radiograph. There is no evidence of new fracture. Both femoral heads remain seated in the acetabula. IMPRESSION: No acute fracture. Chronic bilateral superior and inferior pubic rami fractures with deformity the pubic symphysis. The right superior pubic ramus and right sacral fractures that were acute on prior CT are not well visualized by radiograph, but overall alignment is unchanged. Electronically Signed   By: Keith Rake M.D.   On: 05/24/2021 15:49    Assessment/Plan  1. Type 2 diabetes mellitus with complication [facility]) Lab Results  Component Value Date   HGBA1C 5.5 04/23/2021   -  continue Metformin  2. Essential hypertension -Blood pressure well controlled Continue current medications  3. Tremor -  stable, continue Cogentin  4. Neuropathy -  stable, continue Gabapentin  5. Other psychotic disorder not due to substance or known physiological condition ([employer]) -  no reported agitation, continue Risperidone  6. Neurocognitive deficits -  has moderate cognitive impairment, continue Namzaric -  continue speech therapy  7. Closed fracture of pubic ramus, sequela -   continue PT and OT for therapeutic strengthening exercises -   fall precautions -  continue PRN Oxycodone  Family/ staff Communication: Discussed plan of care with resident and charge nurse.  Labs/tests ordered:  None    Durenda Age, DNP, MSN, FNP-BC [facility] and Adult Medicine [phone] (Monday-Friday 8:00 a.m. - 5:00 p.m.) [phone] (after hours)

## 2021-06-21 ENCOUNTER — Ambulatory Visit (INDEPENDENT_AMBULATORY_CARE_PROVIDER_SITE_OTHER): Payer: 59

## 2021-06-21 ENCOUNTER — Ambulatory Visit (INDEPENDENT_AMBULATORY_CARE_PROVIDER_SITE_OTHER): Payer: 59 | Admitting: Orthopaedic Surgery

## 2021-06-21 ENCOUNTER — Encounter: Payer: Self-pay | Admitting: Orthopaedic Surgery

## 2021-06-21 DIAGNOSIS — S32414D Nondisplaced fracture of anterior wall of right acetabulum, subsequent encounter for fracture with routine healing: Secondary | ICD-10-CM

## 2021-06-21 NOTE — Progress Notes (Signed)
The patient is now 2 months status post a mechanical fall in which she sustained a nondisplaced right acetabular fracture.  She also has pubic rami fractures.  She is convalescing in a skilled nursing facility.  She is 76 years old.  She says she feels much better overall but still has some soreness.  She has been nonweightbearing on that right hip.  On exam today I can put her hip easily through internal and external rotation and compression and this does not cause significant discomfort for her at all.  An AP pelvis shows of the acetabular fracture believe is healed and the joint space is well-maintained on the right side.  There is healing to a healed superior and inferior right rami fracture.  The SI joints are congruent and posterior pelvis appears congruent.  At this point follow-up for her pelvis fractures can be as needed.  I put instructions for the skilled nursing facility to work on her mobility with weightbearing as tolerated but only up with assistance.  The need to work on her stamina as well as her strengthening and balance and coordination.  Follow-up for pelvis is as needed this standpoint.

## 2021-07-08 ENCOUNTER — Encounter: Payer: Self-pay | Admitting: Gastroenterology

## 2021-12-08 ENCOUNTER — Encounter (HOSPITAL_COMMUNITY): Payer: Self-pay | Admitting: *Deleted

## 2021-12-08 ENCOUNTER — Other Ambulatory Visit: Payer: Self-pay

## 2021-12-08 ENCOUNTER — Emergency Department (HOSPITAL_COMMUNITY)
Admission: EM | Admit: 2021-12-08 | Discharge: 2021-12-09 | Disposition: A | Discharge status: Other Acute Inpatient Hospital | Payer: 59 | Attending: Emergency Medicine | Admitting: Emergency Medicine

## 2021-12-08 ENCOUNTER — Emergency Department (HOSPITAL_COMMUNITY): Payer: 59

## 2021-12-08 DIAGNOSIS — Z79899 Other long term (current) drug therapy: Secondary | ICD-10-CM | POA: Insufficient documentation

## 2021-12-08 DIAGNOSIS — W19XXXA Unspecified fall, initial encounter: Secondary | ICD-10-CM | POA: Diagnosis not present

## 2021-12-08 DIAGNOSIS — Z7982 Long term (current) use of aspirin: Secondary | ICD-10-CM | POA: Insufficient documentation

## 2021-12-08 DIAGNOSIS — R109 Unspecified abdominal pain: Secondary | ICD-10-CM | POA: Diagnosis not present

## 2021-12-08 DIAGNOSIS — I2510 Atherosclerotic heart disease of native coronary artery without angina pectoris: Secondary | ICD-10-CM | POA: Insufficient documentation

## 2021-12-08 DIAGNOSIS — I10 Essential (primary) hypertension: Secondary | ICD-10-CM | POA: Insufficient documentation

## 2021-12-08 DIAGNOSIS — Z91040 Latex allergy status: Secondary | ICD-10-CM | POA: Diagnosis not present

## 2021-12-08 DIAGNOSIS — R519 Headache, unspecified: Secondary | ICD-10-CM | POA: Diagnosis present

## 2021-12-08 DIAGNOSIS — E119 Type 2 diabetes mellitus without complications: Secondary | ICD-10-CM | POA: Diagnosis not present

## 2021-12-08 DIAGNOSIS — Z7984 Long term (current) use of oral hypoglycemic drugs: Secondary | ICD-10-CM | POA: Insufficient documentation

## 2021-12-08 LAB — BASIC METABOLIC PANEL
Anion gap: 15 (ref 5–15)
BUN: 12 mg/dL (ref 8–23)
CO2: 20 mmol/L — ABNORMAL LOW (ref 22–32)
Calcium: 9 mg/dL (ref 8.9–10.3)
Chloride: 100 mmol/L (ref 98–111)
Creatinine, Ser: 0.5 mg/dL (ref 0.44–1.00)
GFR, Estimated: >60 mL/min (ref >60)
Glucose, Bld: 102 mg/dL — ABNORMAL HIGH (ref 70–99)
Potassium: 3.9 mmol/L (ref 3.5–5.1)
Sodium: 135 mmol/L (ref 135–145)

## 2021-12-08 LAB — CBC WITH DIFFERENTIAL/PLATELET
Abs Immature Granulocytes: 0.02 10*3/uL (ref 0.00–0.07)
Basophils Absolute: 0 10*3/uL (ref 0.0–0.1)
Basophils Relative: 1 %
Eosinophils Absolute: 0.2 10*3/uL (ref 0.0–0.5)
Eosinophils Relative: 3 %
HCT: 37.3 % (ref 36.0–46.0)
Hemoglobin: 12 g/dL (ref 12.0–15.0)
Immature Granulocytes: 0 %
Lymphocytes Relative: 23 %
Lymphs Abs: 1.8 10*3/uL (ref 0.7–4.0)
MCH: 30.4 pg (ref 26.0–34.0)
MCHC: 32.2 g/dL (ref 30.0–36.0)
MCV: 94.4 fL (ref 80.0–100.0)
Monocytes Absolute: 0.9 10*3/uL (ref 0.1–1.0)
Monocytes Relative: 12 %
Neutro Abs: 5 10*3/uL (ref 1.7–7.7)
Neutrophils Relative %: 61 %
Platelets: 255 10*3/uL (ref 150–400)
RBC: 3.95 MIL/uL (ref 3.87–5.11)
RDW: 14.1 % (ref 11.5–15.5)
WBC: 8 10*3/uL (ref 4.0–10.5)
nRBC: 0 % (ref 0.0–0.2)

## 2021-12-08 MED ORDER — OXYCODONE HCL 5 MG PO TABS
5.0000 mg | ORAL_TABLET | Freq: Once | ORAL | Status: AC
  Administered 2021-12-08: 5 mg via ORAL
  Filled 2021-12-08: qty 1

## 2021-12-08 NOTE — ED Provider Notes (Signed)
[name]   CSN: [id number] Arrival date & time: 12/08/21  2110     History  Chief Complaint  Patient presents with   Katherine [name]    Katherine [name] is a 76 year old female with history of History of CAD, T2DM, HTN, neuropathy, anxiety, tremor who presents to the emergency department from her skilled nursing facility for fall.  The patient states that she was bending down to tie her shoe, when she fell onto her face.  She did not lose consciousness.  She denies any preceding headache, chest pain, shortness of breath, or abdominal pain. She has a mild headache at this time.  She takes aspirin, but no blood thinners.   The history is provided by the patient and the EMS personnel.  Fall       Home Medications Prior to Admission medications   Medication Sig Start Date End Date Taking? Authorizing Provider  acetaminophen (TYLENOL) 500 MG tablet Take 500 mg by mouth every 6 (six) hours as needed for mild pain or headache.    [provider]  albuterol (VENTOLIN HFA) 108 (90 Base) MCG/ACT inhaler Inhale 2 puffs into the lungs 2 (two) times daily as needed for wheezing or shortness of breath. 06/03/21   Medina-Vargas, Monina C, NP  alendronate (FOSAMAX) 70 MG tablet Take 1 tablet (70 mg total) by mouth every Monday. 06/06/21   Medina-Vargas, Monina C, NP  Amino Acids-Protein Hydrolys (PRO-STAT) LIQD 30 ML TWICE A DAY TO PROMOTE WOUND HEALING    [provider]  ascorbic acid (VITAMIN C) 500 MG tablet Take 500 mg by mouth daily.    [provider]  aspirin EC 81 MG tablet Take 1 tablet (81 mg total) by mouth every other day. 06/03/21   Medina-Vargas, Monina C, NP  azithromycin (ZITHROMAX) 500 MG tablet Take 1 tablet (500 mg) one hour prior to all dental visits. Patient not taking: Reported on 05/26/2021 04/21/20   Janetta Horahompson, Kathryn R, PA-C  benztropine (COGENTIN) 0.5 MG tablet Take 1 tablet (0.5 mg total) by mouth in the  morning and at bedtime. 06/03/21   Medina-Vargas, Monina C, NP  bisacodyl (DULCOLAX) 10 MG suppository If no BM in 3 days, give 30 cc Milk of Magnesium p.o. x 1 dose in 24 hours as needed    [provider]  calcium-vitamin D (OSCAL WITH D) 500-200 MG-UNIT tablet Take 1 tablet by mouth 3 (three) times daily. 02/25/15   [name], [name], MD  cetirizine (ZYRTEC) 10 MG tablet Take 1 tablet (10 mg total) by mouth daily. 06/03/21   Medina-Vargas, Monina C, NP  diclofenac Sodium (VOLTAREN) 1 % GEL Apply 2 g topically 4 (four) times daily as needed. 06/03/21   Medina-Vargas, Monina C, NP  fluticasone (FLONASE) 50 MCG/ACT nasal spray Place 2 sprays into both nostrils daily. 06/03/21   Medina-Vargas, Monina C, NP  gabapentin (NEURONTIN) 100 MG capsule Take 2 capsules (200 mg total) by mouth 2 (two) times daily. Take 200 mg by mouth in the morning and 100 mg at bedtime 06/03/21   Medina-Vargas, Monina C, NP  hydrochlorothiazide (MICROZIDE) 12.5 MG capsule Take 1 capsule (12.5 mg total) by mouth in the morning. 06/03/21   Medina-Vargas, Monina C, NP  hydrOXYzine (ATARAX) 10 MG tablet Take 1 tablet (10 mg total) by mouth 2 (two) times daily as needed. Patient not taking: Reported on 06/15/2021 06/03/21   Medina-Vargas, Monina C, NP  ipratropium (ATROVENT) 0.06 % nasal spray USE  1 SPRAY IN EACH NOSTRIL THREE TIMES DAILY 06/03/21   Medina-Vargas, Monina C, NP  Magnesium Hydroxide (MILK OF MAGNESIA PO) If no BM in 3 days, give 30 cc Milk of Magnesium p.o. x 1 dose in 24 hours as needed    [provider]  metFORMIN (GLUCOPHAGE) 500 MG tablet Take 1 tablet (500 mg total) by mouth in the morning and at bedtime. 06/03/21   Medina-Vargas, Monina C, NP  Multiple Vitamin (MULTIVITAMIN WITH MINERALS) TABS tablet Take 1 tablet by mouth daily with breakfast.    [provider]  NAMZARIC 28-10 MG CP24 Take 1 capsule by mouth in the morning. 06/03/21   Medina-Vargas, Monina C, NP  nitroGLYCERIN (NITROSTAT) 0.4 MG SL tablet  DISSOLVE 1 TABLET UNDER THE TONGUE EVERY 5 MINUTES AS NEEDED FOR CHEST PAIN AS DIRECTED 06/03/21   Medina-Vargas, Margit Banda, NP  NON FORMULARY Diet:Heart Healthy/CCD/thin liquids    [provider]  Omega-3 1000 MG CAPS Take 1,000 mg by mouth every Monday, Wednesday, and Friday.    [provider]  Dola Argyle LANCETS 33G MISC CHECK BLOOD SUGAR ONCE DAILY AS DIRECTED Patient not taking: Reported on 05/26/2021 06/23/15   Corwin Levins, MD  [facility] VERIO test strip SMARTSIG:1 Each Via Meter Twice Daily Patient not taking: Reported on 05/26/2021 11/17/20   [provider]  oxyCODONE (OXY IR/ROXICODONE) 5 MG immediate release tablet Take 1 tablet (5 mg total) by mouth every 4 (four) hours as needed for moderate pain. 06/03/21   Medina-Vargas, Monina C, NP  pantoprazole (PROTONIX) 40 MG tablet Take 1 tablet (40 mg total) by mouth daily. 06/03/21   Medina-Vargas, Monina C, NP  Potassium Chloride ER 20 MEQ TBCR Take 20 mEq by mouth in the morning and at bedtime. 06/03/21   Medina-Vargas, Monina C, NP  risperiDONE (RISPERDAL) 3 MG tablet Take 1 tablet (3 mg total) by mouth at bedtime. 06/03/21   Medina-Vargas, Monina C, NP  rosuvastatin (CRESTOR) 40 MG tablet Take 1 tablet orally daily at 6 PM. 06/03/21   Medina-Vargas, Monina C, NP  sodium chloride (OCEAN) 0.65 % SOLN nasal spray Place 1 spray into both nostrils as needed for congestion.    [provider]  Sodium Phosphates (RA SALINE ENEMA RE) If not relieved by Biscodyl suppository, give disposable Saline Enema rectally X 1 dose/24 hrs as needed    [provider]  UREA 20 INTENSIVE HYDRATING 20 % cream Apply 1 application. topically daily as needed (for dry skin). Patient not taking: Reported on 05/26/2021 08/07/16   [provider]      Allergies    Penicillins, Pollen extract, Coconut (cocos nucifera), Latex, and Mellaril [thioridazine]    Review of Systems   Review of Systems  See HPI.   Physical  Exam Updated Vital Signs BP 110/67   Pulse 61   Temp (!) 97 F (36.1 C) (Axillary)   Resp 17   SpO2 96%   Physical Exam Vitals and nursing note reviewed.  Constitutional:      General: She is not in acute distress.    Appearance: She is not toxic-appearing.  HENT:     Head: Normocephalic.     Comments: Small ecchymoses above left eye. Full EOM, no proptosis, no vision changes.     Right Ear: External ear normal.     Left Ear: External ear normal.     Nose: Nose normal.     Mouth/Throat:     Pharynx: Oropharynx is clear.  Eyes:  Extraocular Movements: Extraocular movements intact.     Conjunctiva/sclera: Conjunctivae normal.     Pupils: Pupils are equal, round, and reactive to light.  Neck:     Comments: Diffuse midline tenderness to palpation of cervical spine, no palpable step-offs.  Cardiovascular:     Rate and Rhythm: Normal rate and regular rhythm.     Pulses: Normal pulses.     Heart sounds: Normal heart sounds.  Pulmonary:     Effort: Pulmonary effort is normal.     Breath sounds: Normal breath sounds.  Abdominal:     General: There is no distension.     Palpations: Abdomen is soft.     Tenderness: There is no abdominal tenderness.  Musculoskeletal:        General: No deformity or signs of injury.     Cervical back: Normal range of motion.  Skin:    General: Skin is warm and dry.  Neurological:     General: No focal deficit present.     Mental Status: She is alert and oriented to person, place, and time.     Sensory: No sensory deficit.     Motor: No weakness.     ED Results / Procedures / Treatments   Labs (all labs ordered are listed, but only abnormal results are displayed) Labs Reviewed  BASIC METABOLIC PANEL - Abnormal; Notable for the following components:      Result Value   CO2 20 (*)    Glucose, Bld 102 (*)    All other components within normal limits  CBC WITH DIFFERENTIAL/PLATELET    EKG EKG Interpretation  Date/Time:  Thursday  December 08 2021 21:47:01 EST Ventricular Rate:  79 PR Interval:  160 QRS Duration: 107 QT Interval:  419 QTC Calculation: 481 R Axis:   -50 Text Interpretation: Sinus arrhythmia Left anterior fascicular block Low voltage, precordial leads Borderline T abnormalities, anterior leads No significant change since last tracing Confirmed by Richardean Canal ([id number]) on 12/08/2021 9:53:52 PM  Radiology CT Head Wo Contrast  Result Date: 12/08/2021 CLINICAL DATA:  Fall, bruising to left eye, head trauma. EXAM: CT HEAD WITHOUT CONTRAST CT MAXILLOFACIAL WITHOUT CONTRAST CT CERVICAL SPINE WITHOUT CONTRAST TECHNIQUE: Multidetector CT imaging of the head, cervical spine, and maxillofacial structures were performed using the standard protocol without intravenous contrast. Multiplanar CT image reconstructions of the cervical spine and maxillofacial structures were also generated. RADIATION DOSE REDUCTION: This exam was performed according to the departmental dose-optimization program which includes automated exposure control, adjustment of the mA and/or kV according to patient size and/or use of iterative reconstruction technique. COMPARISON:  04/23/2021. FINDINGS: CT HEAD FINDINGS Brain: No acute intracranial hemorrhage, midline shift or mass effect. No extra-axial fluid collection. Periventricular white matter hypodensities are present bilaterally. No hydrocephalus. Evaluation of the posterior fossa is limited due to streak artifact. Vascular: Atherosclerotic calcification of the carotid siphons and vertebral arteries. No hyperdense vessel. Skull: Normal. Negative for fracture or focal lesion. Other: None. CT MAXILLOFACIAL FINDINGS Osseous: No acute fracture. Degenerative changes are noted at the TMJs bilaterally, greater on the left than on the right. Orbits: Negative. No traumatic or inflammatory finding. Sinuses: There is complete opacification of the frontal sinus on the right. Mucosal thickening is noted in the right  maxillary sinus and sphenoid sinus. Soft tissues: Small hematoma over the left orbit laterally. CT CERVICAL SPINE FINDINGS Alignment: Normal. Skull base and vertebrae: No acute fracture. No primary bone lesion or focal pathologic process. Soft tissues and spinal canal: No  prevertebral fluid or swelling. No visible canal hematoma. Disc levels: Intervertebral disc space narrowing, disc osteophyte formation, and facet arthropathy is noted Upper chest: Aortic atherosclerosis. Other: None. IMPRESSION: 1. No acute intracranial hemorrhage. 2. Multilevel degenerative changes in the cervical spine without evidence of acute fracture. 3. No evidence of acute facial bone fracture. Electronically Signed   By: Thornell Sartorius M.D.   On: 12/08/2021 23:53   CT Cervical Spine Wo Contrast  Result Date: 12/08/2021 CLINICAL DATA:  Fall, bruising to left eye, head trauma. EXAM: CT HEAD WITHOUT CONTRAST CT MAXILLOFACIAL WITHOUT CONTRAST CT CERVICAL SPINE WITHOUT CONTRAST TECHNIQUE: Multidetector CT imaging of the head, cervical spine, and maxillofacial structures were performed using the standard protocol without intravenous contrast. Multiplanar CT image reconstructions of the cervical spine and maxillofacial structures were also generated. RADIATION DOSE REDUCTION: This exam was performed according to the departmental dose-optimization program which includes automated exposure control, adjustment of the mA and/or kV according to patient size and/or use of iterative reconstruction technique. COMPARISON:  04/23/2021. FINDINGS: CT HEAD FINDINGS Brain: No acute intracranial hemorrhage, midline shift or mass effect. No extra-axial fluid collection. Periventricular white matter hypodensities are present bilaterally. No hydrocephalus. Evaluation of the posterior fossa is limited due to streak artifact. Vascular: Atherosclerotic calcification of the carotid siphons and vertebral arteries. No hyperdense vessel. Skull: Normal. Negative for  fracture or focal lesion. Other: None. CT MAXILLOFACIAL FINDINGS Osseous: No acute fracture. Degenerative changes are noted at the TMJs bilaterally, greater on the left than on the right. Orbits: Negative. No traumatic or inflammatory finding. Sinuses: There is complete opacification of the frontal sinus on the right. Mucosal thickening is noted in the right maxillary sinus and sphenoid sinus. Soft tissues: Small hematoma over the left orbit laterally. CT CERVICAL SPINE FINDINGS Alignment: Normal. Skull base and vertebrae: No acute fracture. No primary bone lesion or focal pathologic process. Soft tissues and spinal canal: No prevertebral fluid or swelling. No visible canal hematoma. Disc levels: Intervertebral disc space narrowing, disc osteophyte formation, and facet arthropathy is noted Upper chest: Aortic atherosclerosis. Other: None. IMPRESSION: 1. No acute intracranial hemorrhage. 2. Multilevel degenerative changes in the cervical spine without evidence of acute fracture. 3. No evidence of acute facial bone fracture. Electronically Signed   By: Thornell Sartorius M.D.   On: 12/08/2021 23:53   CT MAXILLOFACIAL WO CONTRAST  Result Date: 12/08/2021 CLINICAL DATA:  Fall, bruising to left eye, head trauma. EXAM: CT HEAD WITHOUT CONTRAST CT MAXILLOFACIAL WITHOUT CONTRAST CT CERVICAL SPINE WITHOUT CONTRAST TECHNIQUE: Multidetector CT imaging of the head, cervical spine, and maxillofacial structures were performed using the standard protocol without intravenous contrast. Multiplanar CT image reconstructions of the cervical spine and maxillofacial structures were also generated. RADIATION DOSE REDUCTION: This exam was performed according to the departmental dose-optimization program which includes automated exposure control, adjustment of the mA and/or kV according to patient size and/or use of iterative reconstruction technique. COMPARISON:  04/23/2021. FINDINGS: CT HEAD FINDINGS Brain: No acute intracranial  hemorrhage, midline shift or mass effect. No extra-axial fluid collection. Periventricular white matter hypodensities are present bilaterally. No hydrocephalus. Evaluation of the posterior fossa is limited due to streak artifact. Vascular: Atherosclerotic calcification of the carotid siphons and vertebral arteries. No hyperdense vessel. Skull: Normal. Negative for fracture or focal lesion. Other: None. CT MAXILLOFACIAL FINDINGS Osseous: No acute fracture. Degenerative changes are noted at the TMJs bilaterally, greater on the left than on the right. Orbits: Negative. No traumatic or inflammatory finding. Sinuses: There is complete opacification  of the frontal sinus on the right. Mucosal thickening is noted in the right maxillary sinus and sphenoid sinus. Soft tissues: Small hematoma over the left orbit laterally. CT CERVICAL SPINE FINDINGS Alignment: Normal. Skull base and vertebrae: No acute fracture. No primary bone lesion or focal pathologic process. Soft tissues and spinal canal: No prevertebral fluid or swelling. No visible canal hematoma. Disc levels: Intervertebral disc space narrowing, disc osteophyte formation, and facet arthropathy is noted Upper chest: Aortic atherosclerosis. Other: None. IMPRESSION: 1. No acute intracranial hemorrhage. 2. Multilevel degenerative changes in the cervical spine without evidence of acute fracture. 3. No evidence of acute facial bone fracture. Electronically Signed   By: Thornell Sartorius M.D.   On: 12/08/2021 23:53    Procedures Procedures    Medications Ordered in ED Medications  oxyCODONE (Oxy IR/ROXICODONE) immediate release tablet 5 mg (5 mg Oral Given 12/08/21 2143)    ED Course/ Medical Decision Making/ A&P                           Medical Decision Making Problems Addressed: Fall, initial encounter: acute illness or injury that poses a threat to life or bodily functions  Amount and/or Complexity of Data Reviewed Independent Historian: EMS External Data  Reviewed: notes. Labs: ordered. Decision-making details documented in ED Course. Radiology: ordered and independent interpretation performed. Decision-making details documented in ED Course. ECG/medicine tests: ordered and independent interpretation performed. Decision-making details documented in ED Course.  Risk Prescription drug management.   EYONNA SANDSTROM is a 76 year old female with history of History of CAD, T2DM, HTN, neuropathy, anxiety, tremor who presents to the emergency department from her skilled nursing facility for fall.  On initial examination, patient is hemodynamically stable and overall well-appearing.  She has no focal neurologic deficits on exam.  She does have a small ecchymoses over the left eye, but no proptosis or pain with extraocular movements or restricted extraocular movements to suggest ocular muscle entrapment or globe rupture, and no gross visual changes.  No focal neurologic deficits appreciated on exam however given older age with headache after the fall and facial trauma will obtain CT head, C-spine, and face to evaluate for facial fractures, C spine fracture/dislocation, and acute intracranial pathology. No abdominal tenderness on exam to suggest intraabdominal trauma. Will obtain CBC, CMP to evaluate for anemia, electrolyte derangements. Will perform EKG to evaluate for possible arrhythmia or ischemic changes though feel unlikely based on presentation. Fall most consistent with mechanical fall.   Workup: Labs reassuring, no anemia with Hgb 12.0, WBC 8.0, plt 255. BMP with glucose 102, Cr WNL at 0.50, no electrolyte derangements. EKG on my interpretation shows sinus arhythmia, 79 bpm. T wave flattening in anterolateral leads, however no significant change from prior. No STEMI or high grade conduction block.   CT head without acute intracranial pathology on my independent review or review by Radiology. CT face, C-spine showed no acute fracture or dislocation.    Patient remained HDS and well appearing in ED. Given reassuring workup and physical exam, feel patient stable for discharge back to facility. Return precautions given.         Final Clinical Impression(s) / ED Diagnoses Final diagnoses:  Fall, initial encounter    Rx / DC Orders ED Discharge Orders     None         Stephanie Coup, MD 12/09/21 1239    Charlynne Pander, MD 12/10/21 [phone]

## 2021-12-08 NOTE — ED Triage Notes (Signed)
Pt from [place] for fall. Reports noticing her shoe was untied, bent over to tie it and fell. Bruising noted to L eye. Pt reports having generalized pain from fibromyalgia. A&Ox4 on arrival

## 2021-12-09 MED ORDER — ALBUTEROL SULFATE HFA 108 (90 BASE) MCG/ACT IN AERS: 2.0000 | INHALATION_SPRAY | Freq: Two times a day (BID) | RESPIRATORY_TRACT | Status: DC | PRN

## 2021-12-09 MED ORDER — RISPERIDONE 3 MG PO TABS: 3.0000 mg | ORAL_TABLET | Freq: Every day | ORAL | Status: DC

## 2021-12-09 MED ORDER — ROSUVASTATIN CALCIUM 20 MG PO TABS: 40.0000 mg | ORAL_TABLET | Freq: Every day | ORAL | Status: DC

## 2021-12-09 MED ORDER — ACETAMINOPHEN 500 MG PO TABS: 500.0000 mg | ORAL_TABLET | Freq: Four times a day (QID) | ORAL | Status: DC | PRN

## 2021-12-09 MED ORDER — PANTOPRAZOLE SODIUM 40 MG PO TBEC: 40.0000 mg | DELAYED_RELEASE_TABLET | Freq: Every day | ORAL | Status: DC

## 2021-12-09 MED ORDER — ASPIRIN 81 MG PO TBEC: 81.0000 mg | DELAYED_RELEASE_TABLET | ORAL | Status: DC

## 2021-12-09 MED ORDER — ALBUTEROL SULFATE (2.5 MG/3ML) 0.083% IN NEBU: 2.5000 mg | INHALATION_SOLUTION | Freq: Two times a day (BID) | RESPIRATORY_TRACT | Status: DC | PRN

## 2021-12-09 MED ORDER — BENZTROPINE MESYLATE 0.5 MG PO TABS: 0.5000 mg | ORAL_TABLET | Freq: Two times a day (BID) | ORAL | Status: DC

## 2021-12-09 MED ORDER — OXYCODONE HCL 5 MG PO TABS: 5.0000 mg | ORAL_TABLET | ORAL | Status: DC | PRN

## 2021-12-09 MED ORDER — GABAPENTIN 100 MG PO CAPS: 200.0000 mg | ORAL_CAPSULE | Freq: Two times a day (BID) | ORAL | Status: DC

## 2021-12-09 MED ORDER — METFORMIN HCL 500 MG PO TABS: 500.0000 mg | ORAL_TABLET | Freq: Every day | ORAL | Status: DC

## 2021-12-09 NOTE — ED Notes (Signed)
[facility] Called 

## 2021-12-09 NOTE — Discharge Instructions (Addendum)
You are seen in the emergency department for a fall.  While you were here, we performed a physical exam, check labs and an EKG, and obtain imaging of your head, spine, and face.  These showed no fractures or dislocations, or brain bleed.   Please come back if you have new weakness or numbness, facial droop, severe headache, passing out, chest pain, difficulty breathing, or other reason to think you need emergency care. We hope you feel better soon.

## 2021-12-09 NOTE — ED Notes (Signed)
Purewick placed on pt. 

## 2022-09-29 ENCOUNTER — Emergency Department (HOSPITAL_COMMUNITY): Payer: Medicare Other

## 2022-09-29 ENCOUNTER — Encounter (HOSPITAL_COMMUNITY): Payer: Self-pay

## 2022-09-29 ENCOUNTER — Emergency Department (HOSPITAL_COMMUNITY)
Admission: EM | Admit: 2022-09-29 | Discharge: 2022-09-29 | Disposition: A | Discharge status: Skilled Nursing Facility | Payer: Medicare Other | Source: Home / Self Care | Attending: Emergency Medicine | Admitting: Emergency Medicine

## 2022-09-29 ENCOUNTER — Other Ambulatory Visit: Payer: Self-pay

## 2022-09-29 DIAGNOSIS — Z1152 Encounter for screening for COVID-19: Secondary | ICD-10-CM | POA: Diagnosis not present

## 2022-09-29 DIAGNOSIS — N3000 Acute cystitis without hematuria: Secondary | ICD-10-CM | POA: Diagnosis not present

## 2022-09-29 DIAGNOSIS — S0990XA Unspecified injury of head, initial encounter: Secondary | ICD-10-CM | POA: Diagnosis present

## 2022-09-29 DIAGNOSIS — I2510 Atherosclerotic heart disease of native coronary artery without angina pectoris: Secondary | ICD-10-CM | POA: Insufficient documentation

## 2022-09-29 DIAGNOSIS — Z91040 Latex allergy status: Secondary | ICD-10-CM | POA: Insufficient documentation

## 2022-09-29 DIAGNOSIS — W19XXXA Unspecified fall, initial encounter: Secondary | ICD-10-CM | POA: Diagnosis not present

## 2022-09-29 DIAGNOSIS — Z79899 Other long term (current) drug therapy: Secondary | ICD-10-CM | POA: Diagnosis not present

## 2022-09-29 DIAGNOSIS — S0081XA Abrasion of other part of head, initial encounter: Secondary | ICD-10-CM | POA: Diagnosis not present

## 2022-09-29 DIAGNOSIS — S3210XA Unspecified fracture of sacrum, initial encounter for closed fracture: Secondary | ICD-10-CM | POA: Insufficient documentation

## 2022-09-29 DIAGNOSIS — Z7982 Long term (current) use of aspirin: Secondary | ICD-10-CM | POA: Insufficient documentation

## 2022-09-29 DIAGNOSIS — I10 Essential (primary) hypertension: Secondary | ICD-10-CM | POA: Insufficient documentation

## 2022-09-29 DIAGNOSIS — S0031XA Abrasion of nose, initial encounter: Secondary | ICD-10-CM | POA: Diagnosis not present

## 2022-09-29 DIAGNOSIS — E119 Type 2 diabetes mellitus without complications: Secondary | ICD-10-CM | POA: Diagnosis not present

## 2022-09-29 DIAGNOSIS — Z7984 Long term (current) use of oral hypoglycemic drugs: Secondary | ICD-10-CM | POA: Insufficient documentation

## 2022-09-29 LAB — COMPREHENSIVE METABOLIC PANEL
ALT: 24 U/L (ref 0–44)
AST: 23 U/L (ref 15–41)
Albumin: 2.9 g/dL — ABNORMAL LOW (ref 3.5–5.0)
Alkaline Phosphatase: 44 U/L (ref 38–126)
Anion gap: 15 (ref 5–15)
BUN: 23 mg/dL (ref 8–23)
CO2: 23 mmol/L (ref 22–32)
Calcium: 9.7 mg/dL (ref 8.9–10.3)
Chloride: 99 mmol/L (ref 98–111)
Creatinine, Ser: 1.04 mg/dL — ABNORMAL HIGH (ref 0.44–1.00)
GFR, Estimated: 55 mL/min — ABNORMAL LOW (ref >60)
Glucose, Bld: 129 mg/dL — ABNORMAL HIGH (ref 70–99)
Potassium: 3.5 mmol/L (ref 3.5–5.1)
Sodium: 137 mmol/L (ref 135–145)
Total Bilirubin: 0.9 mg/dL (ref 0.3–1.2)
Total Protein: 5.6 g/dL — ABNORMAL LOW (ref 6.5–8.1)

## 2022-09-29 LAB — RESP PANEL BY RT-PCR (RSV, FLU A&B, COVID)  RVPGX2
Influenza A by PCR: NEGATIVE
Influenza B by PCR: NEGATIVE
Resp Syncytial Virus by PCR: NEGATIVE
SARS Coronavirus 2 by RT PCR: NEGATIVE

## 2022-09-29 LAB — CBC WITH DIFFERENTIAL/PLATELET
Abs Immature Granulocytes: 0.24 10*3/uL — ABNORMAL HIGH (ref 0.00–0.07)
Basophils Absolute: 0 10*3/uL (ref 0.0–0.1)
Basophils Relative: 0 %
Eosinophils Absolute: 0 10*3/uL (ref 0.0–0.5)
Eosinophils Relative: 0 %
HCT: 41.4 % (ref 36.0–46.0)
Hemoglobin: 13.1 g/dL (ref 12.0–15.0)
Immature Granulocytes: 2 %
Lymphocytes Relative: 5 %
Lymphs Abs: 0.8 10*3/uL (ref 0.7–4.0)
MCH: 31.5 pg (ref 26.0–34.0)
MCHC: 31.6 g/dL (ref 30.0–36.0)
MCV: 99.5 fL (ref 80.0–100.0)
Monocytes Absolute: 0.4 10*3/uL (ref 0.1–1.0)
Monocytes Relative: 3 %
Neutro Abs: 14.9 10*3/uL — ABNORMAL HIGH (ref 1.7–7.7)
Neutrophils Relative %: 90 %
Platelets: 159 10*3/uL (ref 150–400)
RBC: 4.16 MIL/uL (ref 3.87–5.11)
RDW: 13.2 % (ref 11.5–15.5)
WBC: 16.3 10*3/uL — ABNORMAL HIGH (ref 4.0–10.5)
nRBC: 0 % (ref 0.0–0.2)

## 2022-09-29 LAB — URINALYSIS, ROUTINE W REFLEX MICROSCOPIC
Bilirubin Urine: NEGATIVE
Glucose, UA: NEGATIVE mg/dL
Hgb urine dipstick: NEGATIVE
Ketones, ur: NEGATIVE mg/dL
Nitrite: NEGATIVE
Protein, ur: 30 mg/dL — AB
Specific Gravity, Urine: 1.008 (ref 1.005–1.030)
WBC, UA: >50 WBC/hpf (ref 0–5)
pH: 5 (ref 5.0–8.0)

## 2022-09-29 MED ORDER — CEPHALEXIN 250 MG PO CAPS
500.0000 mg | ORAL_CAPSULE | Freq: Once | ORAL | Status: AC
  Administered 2022-09-29: 500 mg via ORAL
  Filled 2022-09-29: qty 2

## 2022-09-29 MED ORDER — ACETAMINOPHEN 325 MG PO TABS
650.0000 mg | ORAL_TABLET | Freq: Once | ORAL | Status: AC
  Administered 2022-09-29: 650 mg via ORAL
  Filled 2022-09-29: qty 2

## 2022-09-29 MED ORDER — SODIUM CHLORIDE 0.9 % IV SOLN: 1.0000 g | Freq: Once | INTRAVENOUS | Status: DC

## 2022-09-29 MED ORDER — CEPHALEXIN 500 MG PO CAPS
500.0000 mg | ORAL_CAPSULE | Freq: Four times a day (QID) | ORAL | 0 refills | Status: AC
Start: 2022-09-29 — End: 2022-10-06

## 2022-09-29 NOTE — ED Provider Notes (Signed)
77 year old female received in signout.  At baseline patient is alert and oriented x 2.  She complains of low back pain.  No other complaints.  On workup she was found to have leukocytosis with some left shift.  Will add on respiratory panel and chest x-ray.  Previous provider did palpate the abdomen.  No tenderness, distention or evidence of acute abdomen. Physical Exam  BP 103/73   Pulse 75   Temp (!) 97.5 F (36.4 C) (Oral)   Resp 15   SpO2 100%     Procedures  Procedures  ED Course / MDM    Medical Decision Making Amount and/or Complexity of Data Reviewed Labs: ordered. Radiology: ordered.  Risk OTC drugs. Prescription drug management.   CT imaging without acute concern with the exception of sacral alla fracture.  UTI on UA.  Keflex prescribed.  Urine culture sent.  Discussed with Dr. Aundria Rud of orthopedic surgery who recommends no intervention.  Recommends activity as tolerated.  Patient appropriate for discharge.       Marita Kansas, PA-C 09/29/22 2038    Lonell Grandchild, MD 10/01/22 [phone]

## 2022-09-29 NOTE — ED Notes (Signed)
[facility] called. They said it would be a bit before they get here.

## 2022-09-29 NOTE — Discharge Instructions (Addendum)
You have a UTI.  Antibiotic prescribed.  You also have a sacral fracture.  Discussed your case with orthopedic surgery.  They recommend no intervention.  They recommend activity as tolerated.  You can follow-up with them outpatient if you like.  Information attached above.  For concerning symptoms return to the emergency room.

## 2022-09-29 NOTE — ED Notes (Signed)
Pt asked if she could let us know when she can provide a urine sample.  Pt reported she could try.  Pt placed on a bedpan.  However, Pt's brief was soaked in urine.  Will notify EDP.

## 2022-09-29 NOTE — ED Notes (Signed)
Patient able to stand at bedside with assistance. Patient reports she can walk with a walker. Patient appears unsteady on feet.

## 2022-09-29 NOTE — ED Triage Notes (Signed)
Per [facility], Pt, from [place], presents w/ abrasion to forehead and bridge of nose following a stumble and fall this morning.  Pain score 4/10.  Hx of dementia.  Pt is not on blood thinners.    Notes from facility state Pt was complaining of back and L shoulder pain at facility.

## 2022-09-29 NOTE — ED Provider Notes (Signed)
[place] EMERGENCY DEPARTMENT AT [facility] Provider Note   CSN: [id number] Arrival date & time: 09/29/22  1149     History  Chief Complaint  Patient presents with   Fall   Head Injury    Katherine [name] is a 77 y.o. female with past medical history of CAD, type 2 diabetes, hypertension, neuropathy, anxiety, tremor, unsteady gait presents to the emergency department via EMS from SNF for fall.  Staff reports that they believe she was walking when she lost her balance due to her baseline unsteady gait falling face first into wall.  Patient complains of lower back pain, abrasion to forehead and nose. Staff report no LOC nor thinners.  She denies shortness of breath, chest pain, nausea, visual disturbance.   Fall Pertinent negatives include no abdominal pain and no headaches.  Head Injury Associated symptoms: no headaches, no nausea, no seizures and no vomiting        Home Medications Prior to Admission medications   Medication Sig Start Date End Date Taking? Authorizing Provider  acetaminophen (TYLENOL) 500 MG tablet Take 500 mg by mouth every 6 (six) hours as needed for mild pain or headache.   Yes [provider]  alendronate (FOSAMAX) 70 MG tablet Take 1 tablet (70 mg total) by mouth every Monday. 06/06/21  Yes Medina-Vargas, Monina C, NP  ascorbic acid (VITAMIN C) 500 MG tablet Take 500 mg by mouth daily.   Yes [provider]  aspirin EC 81 MG tablet Take 1 tablet (81 mg total) by mouth every other day. 06/03/21  Yes Medina-Vargas, Monina C, NP  benztropine (COGENTIN) 0.5 MG tablet Take 1 tablet (0.5 mg total) by mouth in the morning and at bedtime. 06/03/21  Yes Medina-Vargas, Monina C, NP  calcium-vitamin D (OSCAL WITH D) 500-200 MG-UNIT tablet Take 1 tablet by mouth 3 (three) times daily. 02/25/15  Yes Corwin Levins, MD  cetirizine (ZYRTEC) 10 MG tablet Take 1 tablet (10 mg total) by mouth daily. 06/03/21  Yes Medina-Vargas, Monina C, NP  fluticasone  (FLONASE) 50 MCG/ACT nasal spray Place 2 sprays into both nostrils daily. 06/03/21  Yes Medina-Vargas, Monina C, NP  gabapentin (NEURONTIN) 100 MG capsule Take 2 capsules (200 mg total) by mouth 2 (two) times daily. Take 200 mg by mouth in the morning and 100 mg at bedtime Patient taking differently: Take 100-200 mg by mouth 2 (two) times daily. Take 200 mg by mouth in the morning and 100 mg at bedtime 06/03/21  Yes Medina-Vargas, Monina C, NP  hydrochlorothiazide (MICROZIDE) 12.5 MG capsule Take 1 capsule (12.5 mg total) by mouth in the morning. 06/03/21  Yes Medina-Vargas, Monina C, NP  ipratropium (ATROVENT) 0.06 % nasal spray USE 1 SPRAY IN EACH NOSTRIL THREE TIMES DAILY 06/03/21  Yes Medina-Vargas, Monina C, NP  metFORMIN (GLUCOPHAGE) 500 MG tablet Take 1 tablet (500 mg total) by mouth in the morning and at bedtime. 06/03/21  Yes Medina-Vargas, Monina C, NP  Multiple Vitamin (MULTIVITAMIN WITH MINERALS) TABS tablet Take 1 tablet by mouth daily with breakfast.   Yes [provider]  NAMZARIC 28-10 MG CP24 Take 1 capsule by mouth in the morning. 06/03/21  Yes Medina-Vargas, Monina C, NP  nitroGLYCERIN (NITROSTAT) 0.4 MG SL tablet DISSOLVE 1 TABLET UNDER THE TONGUE EVERY 5 MINUTES AS NEEDED FOR CHEST PAIN AS DIRECTED 06/03/21  Yes Medina-Vargas, Monina C, NP  Omega-3 1000 MG CAPS Take 1,000 mg by mouth every Monday, Wednesday, and Friday.   Yes [provider]  pantoprazole (PROTONIX) 40 MG tablet Take 1 tablet (40 mg total) by mouth daily. 06/03/21  Yes Medina-Vargas, Monina C, NP  Potassium Chloride ER 20 MEQ TBCR Take 20 mEq by mouth in the morning and at bedtime. 06/03/21  Yes Medina-Vargas, Monina C, NP  risperiDONE (RISPERDAL) 2 MG tablet Take 2 mg by mouth at bedtime. 09/22/22  Yes [provider]  rosuvastatin (CRESTOR) 40 MG tablet Take 1 tablet orally daily at 6 PM. 06/03/21  Yes Medina-Vargas, Monina C, NP  NON FORMULARY Diet:Heart Healthy/CCD/thin liquids    [provider]       Allergies    Penicillins, Pollen extract, Coconut (cocos nucifera), Coconut fatty acids, Latex, and Mellaril [thioridazine]    Review of Systems   Review of Systems  Constitutional:  Negative for fatigue and fever.  Gastrointestinal:  Negative for abdominal pain, diarrhea, nausea and vomiting.  Neurological:  Positive for tremors. Negative for dizziness, seizures, weakness, light-headedness and headaches.       Steady gait and tremor at baseline    Physical Exam Updated Vital Signs BP 127/80   Pulse 71   Temp (!) 97.5 F (36.4 C) (Oral)   Resp 16   SpO2 100%  Physical Exam Constitutional:      Appearance: Normal appearance.  HENT:     Head: Normocephalic.  Cardiovascular:     Rate and Rhythm: Normal rate. Rhythm irregular.     Pulses: Normal pulses.  Pulmonary:     Effort: Pulmonary effort is normal.     Breath sounds: Normal breath sounds.  Abdominal:     General: There is no distension.     Palpations: Abdomen is soft. There is no mass.     Tenderness: There is no abdominal tenderness. There is no guarding or rebound.  Musculoskeletal:        General: No swelling, tenderness or signs of injury. Normal range of motion.     Cervical back: Normal range of motion and neck supple. No rigidity or tenderness.  Skin:    General: Skin is warm and dry.     Capillary Refill: Capillary refill takes less than 2 seconds.     Comments: Abrasion to forehead and bridge of nose  Neurological:     Mental Status: She is alert. Mental status is at baseline.     Comments: A&Ox2 is baseline with confusion to current time     ED Results / Procedures / Treatments   Labs (all labs ordered are listed, but only abnormal results are displayed) Labs Reviewed  CBC WITH DIFFERENTIAL/PLATELET - Abnormal; Notable for the following components:      Result Value   WBC 16.3 (*)    Neutro Abs 14.9 (*)    Abs Immature Granulocytes 0.24 (*)    All other components within normal limits   COMPREHENSIVE METABOLIC PANEL - Abnormal; Notable for the following components:   Glucose, Bld 129 (*)    Creatinine, Ser 1.04 (*)    Total Protein 5.6 (*)    Albumin 2.9 (*)    GFR, Estimated 55 (*)    All other components within normal limits  RESP PANEL BY RT-PCR (RSV, FLU A&B, COVID)  RVPGX2  URINALYSIS, ROUTINE W REFLEX MICROSCOPIC    EKG None  Radiology No results found.  Procedures Procedures    Medications Ordered in ED Medications - No data to display  ED Course/ Medical Decision Making/ A&P  Medical Decision Making  Aliceann is a 77 year old female who presents emergency department for evaluation following a fall at SNF.  See HPI for further details.  Upon assessment, patient is resting comfortably in bed abrasion to forehead and bridge of nose. No obvious deformities to face or head noted.  She has has tremor and unsteady gait per baseline but no new focal neurological deficits appreciated on exam. Patient is A&O x 2 per her baseline.  No tenderness to palpation of chest wall and abdomen. Abdomen is soft to palpation and pelvis is stable.  Patient reports pain at lumbar spine upon palpation. No crepitus, step off, or deformities noted to spine. CT for underlying trauma pending.  I personally reviewed EKG which is significant for atrial fibrillation and unchanged from prior. CMP significant for elevated creatinine of 1.04 (baseline of 0.5) and leukocytosis 16.3.  Will obtain UA to rule out UTI.  Unknown if patient is having urinary symptoms as baseline is A&O x 2. Will obtain COVID and CXR to r/o other sources of infection. Pending CT and labwork.  Sign out to Amjad PA.        Final Clinical Impression(s) / ED Diagnoses Final diagnoses:  Injury of head, initial encounter    Rx / DC Orders ED Discharge Orders     None         Judithann Sheen, PA 09/29/22 1554    Tanda Rockers A, DO 09/30/22 0745

## 2022-09-29 NOTE — ED Notes (Signed)
Patient transported to CT 

## 2022-10-02 LAB — URINE CULTURE: Culture: 50000 — AB

## 2022-10-03 ENCOUNTER — Telehealth (HOSPITAL_BASED_OUTPATIENT_CLINIC_OR_DEPARTMENT_OTHER): Payer: Self-pay | Admitting: *Deleted

## 2022-10-03 NOTE — Telephone Encounter (Signed)
Post ED Visit - Positive Culture Follow-up  Culture report reviewed by antimicrobial stewardship pharmacist: [facility] []  Enzo Bi, Pharm.D. []  Celedonio Miyamoto, Pharm.D., BCPS AQ-ID []  Garvin Fila, Pharm.D., BCPS []  Georgina Pillion, Pharm.D., BCPS []  [place], [address].D., BCPS, AAHIVP []  Estella Husk, Pharm.D., BCPS, AAHIVP []  Lysle Pearl, PharmD, BCPS []  Phillips Climes, PharmD, BCPS []  Agapito Games, PharmD, BCPS []  Verlan Friends, PharmD []  Mervyn Gay, PharmD, BCPS []  Vinnie Level, PharmD  [facility] []  Len Childs, PharmD []  Greer Pickerel, PharmD []  Adalberto Cole, PharmD []  Perlie Gold, Rph []  Lonell Face) Jean Rosenthal, PharmD []  Earl Many, PharmD []  Junita Push, PharmD []  Dorna Leitz, PharmD []  Terrilee Files, PharmD []  Lynann Beaver, PharmD []  Keturah Barre, PharmD []  Loralee Pacas, PharmD []  Bernadene Person, PharmD   Positive urine culture Treated with Cephalexin and results faxed to [facility] for further recommendation. No further patient follow-up is required at this time.  Benjiman Core, MD  Virl Axe Talley 10/03/2022, 9:28 AM

## 2022-10-03 NOTE — Progress Notes (Signed)
ED Antimicrobial Stewardship Positive Culture Follow Up   Katherine [name] is an 77 y.o. female who presented to [facility] on 09/29/2022 with a chief complaint of  Chief Complaint  Patient presents with   Fall   Head Injury    Recent Results (from the past 720 hour(s))  Resp panel by RT-PCR (RSV, Flu A&B, Covid) Anterior Nasal Swab     Status: None   Collection Time: 09/29/22  3:44 PM   Specimen: Anterior Nasal Swab  Result Value Ref Range Status   SARS Coronavirus 2 by RT PCR NEGATIVE NEGATIVE Final   Influenza A by PCR NEGATIVE NEGATIVE Final   Influenza B by PCR NEGATIVE NEGATIVE Final    Comment: (NOTE) The Xpert Xpress SARS-CoV-2/FLU/RSV plus assay is intended as an aid in the diagnosis of influenza from Nasopharyngeal swab specimens and should not be used as a sole basis for treatment. Nasal washings and aspirates are unacceptable for Xpert Xpress SARS-CoV-2/FLU/RSV testing.  Fact Sheet for Patients: BloggerCourse.com  Fact Sheet for Healthcare Providers: SeriousBroker.it  This test is not yet approved or cleared by the [place] FDA and has been authorized for detection and/or diagnosis of SARS-CoV-2 by FDA under an Emergency Use Authorization (EUA). This EUA will remain in effect (meaning this test can be used) for the duration of the COVID-19 declaration under Section 564(b)(1) of the Act, 21 U.S.C. section 360bbb-3(b)(1), unless the authorization is terminated or revoked.     Resp Syncytial Virus by PCR NEGATIVE NEGATIVE Final    Comment: (NOTE) Fact Sheet for Patients: BloggerCourse.com  Fact Sheet for Healthcare Providers: SeriousBroker.it  This test is not yet approved or cleared by the [place] FDA and has been authorized for detection and/or diagnosis of SARS-CoV-2 by FDA under an Emergency Use Authorization (EUA). This EUA will remain in effect  (meaning this test can be used) for the duration of the COVID-19 declaration under Section 564(b)(1) of the Act, 21 U.S.C. section 360bbb-3(b)(1), unless the authorization is terminated or revoked.  Performed at [facility], [address] [place], [place] [address]   Urine Culture     Status: Abnormal   Collection Time: 09/29/22  9:16 PM   Specimen: Urine, Clean Catch  Result Value Ref Range Status   Specimen Description URINE, CLEAN CATCH  Final   Special Requests   Final    NONE Performed at [facility], [address], [place], [place] [address]    Culture (A)  Final    50,000 COLONIES/mL ESCHERICHIA COLI Confirmed Extended Spectrum Beta-Lactamase Producer (ESBL).  In bloodstream infections from ESBL organisms, carbapenems are preferred over piperacillin/tazobactam. They are shown to have a lower risk of mortality.    Report Status 10/02/2022 FINAL  Final   Organism ID, Bacteria ESCHERICHIA COLI (A)  Final      Susceptibility   Escherichia coli - MIC*    AMPICILLIN >=32 RESISTANT Resistant     CEFAZOLIN >=64 RESISTANT Resistant     CEFEPIME >=32 RESISTANT Resistant     CEFTRIAXONE >=64 RESISTANT Resistant     CIPROFLOXACIN >=4 RESISTANT Resistant     GENTAMICIN 8 INTERMEDIATE Intermediate     IMIPENEM <=0.25 SENSITIVE Sensitive     NITROFURANTOIN <=16 SENSITIVE Sensitive     TRIMETH/SULFA >=320 RESISTANT Resistant     AMPICILLIN/SULBACTAM >=32 RESISTANT Resistant     PIP/TAZO 8 SENSITIVE Sensitive     * 50,000 COLONIES/mL ESCHERICHIA COLI    [x]  Treated with cephalexin, organism  resistant to prescribed antimicrobial   New antibiotic prescription: Stop cephalexin.  Fax results to [facility] - provider to decide of further antibiotics needed.    ED Provider: Benjiman Core, MD   [name] 10/03/2022, 8:53 AM Clinical Pharmacist Monday - Friday phone -  [phone] Saturday - Sunday phone - [phone]

## 2022-12-11 NOTE — Progress Notes (Unsigned)
Cardiology Office Note:    Date:  12/13/2022  ID:  Katherine [name], DOB 10-Sep-1945, MRN [id number] PCP: Ronnald Collum  [facility] Providers Cardiologist:  [name], MD       Patient Profile:      Coronary artery disease  s/p orbital atherectomy, 3 x 22 mm DES to pRCA in 03/2019 LHC 04/15/19: mLAD 40, pLCx 75, mLCx 75, pRCA 80 (PCI) Aortic stenosis S/p TAVR in 03/2019 TTE 3/21: mean 5 mmHg  TTE 05/22/19: EF 60-65, no RWMA, mild LVH, normal RVSF, mild LAE, trivial MR, s/p TAVR (mean 10 mmHg), no PVL TTE 04/21/20: EF 60-65, no RWMA, Gr 1 DD, NL RVSF, trivial MR, s/p TAVR with normal structure and function, mean 6 mmHg Hx of TIA in 12/2018 Remote AFib Hx of pulmonary embolism  Carotid artery disease [place] 08/2018: Bilateral ICA 1-39 Hypothyroidism  Dementia  Hypertension  Hyperlipidemia  Diabetes mellitus            History of Present Illness:  Discussed the use of AI scribe software for clinical note transcription with the patient, who gave verbal consent to proceed.  Katherine [name] is a 77 y.o. female who returns for follow up of CAD, AS. She was last seen by Cline Crock, PA-C [facility] in 03/2020.  She is here with her sister. She lives at [facility].  She was started on Eliquis approximately six weeks ago.  She apparently had a rhythm strip at the SNF that demonstrated atrial fibrillation.  We contacted the SNF to get a copy.  However, the rhythm strip is no longer in her chart.  She has been experiencing chest pain intermittently for the past couple of weeks, described as brief episodes of discomfort located in the substernal and left chest area. The pain does not radiate and is not associated with shortness of breath. The patient denies any previous similar episodes, syncope, palpitations, orthopnea, or paroxysmal nocturnal dyspnea.  She does note lower extremity edema.  The patient has a history of multiple falls, the most recent  resulting in a head injury in August 2024.  Head CT was negative for bleed.  She had a fall in March 2023 resulting in pelvic fracture.  She has been wheelchair-bound for the past eight months. She denies falls in the last month.  Her sister notes that she was on prednisone 40mg  daily for an unclear indication, possibly related to a past episode of pneumonia. The prednisone was tapered off over several weeks and has now been discontinued.     ROS   See HPI     Studies Reviewed:   EKG Interpretation Date/Time:  Wednesday December 13 2022 12:16:23 EST Ventricular Rate:  82 PR Interval:  152 QRS Duration:  84 QT Interval:  390 QTC Calculation: 455 R Axis:   -51  Text Interpretation: Sinus rhythm with marked sinus arrhythmia Left axis deviation Low voltage QRS Possible Anterolateral infarct , age undetermined Similar to multiple old ECGs Confirmed by Tereso Newcomer [phone]) on 12/13/2022 12:18:50 PM             Risk Assessment/Calculations:             Physical Exam:   VS:  BP 124/76   Pulse 72   Ht 4\' 11"  (1.499 m)   SpO2 90%   BMI 24.92 kg/m    Wt Readings from Last 3 Encounters:  06/15/21 123 lb 6.4 oz (56 kg)  06/03/21 119  lb 6.4 oz (54.2 kg)  05/26/21 118 lb (53.5 kg)    Constitutional:      Appearance: Not in distress. Frail. Chronically ill-appearing.  Pulmonary:     Breath sounds: Normal breath sounds. No wheezing. No rales.  Cardiovascular:     Normal rate. Irregularly irregular rhythm.     Murmurs: There is no murmur.  Edema:    Peripheral edema present.    Ankle: bilateral 1+ edema of the ankle. Abdominal:     Palpations: Abdomen is soft.  Musculoskeletal:     Cervical back: Neck supple.       Assessment and Plan:   Assessment & Plan Paroxysmal atrial fibrillation [facility]) Reported episode of atrial fibrillation at skilled nursing facility.  Rhythm strip obtained but no longer available in her chart.  Therefore, I cannot confirm that she was in atrial  fibrillation.  She does have a history of sinus arrhythmia and PACs which has been incorrectly read out as atrial fibrillation in the past.  She is currently in sinus rhythm today with PACs.  She is high risk for bleeding with frequent falls.  Long-term anticoagulation is not likely in her best interest. -Arrange 30-day event monitor -Arrange 2D echocardiogram -If no A-fib on event monitor, stop apixaban -If A-fib confirmed, I will review with Dr. Excell Seltzer to see if the patient could be a candidate for LAAOD ([place]) -Follow-up 6 to 8 weeks Coronary artery disease involving native coronary artery of native heart without angina pectoris History of DES to the proximal RCA in 2021.  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  She notes occasional episodes of chest discomfort that is fleeting and sounds noncardiac.  Her EKG does not demonstrate any acute changes today.  She does not appear to be a good candidate for invasive cardiac evaluation such as cardiac catheterization.  Therefore, I am not sure that stress testing will provide much benefit.  I will place her on a very low-dose of beta-blocker to see if this improves her symptoms.  If she continues to have chest pain, consider stress testing. -Continue aspirin 81 mg daily, nitroglycerin as needed, Crestor 40 mg daily -Start Toprol XL 12.5 mg daily -Follow-up 6-8 weeks Severe aortic stenosis S/p TAVR in March 2021.  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  Given recent symptoms of chest pain, will update echocardiogram.  Continue SBE prophylaxis. Essential hypertension Blood pressure controlled.  Continue hydrochlorothiazide 12.5 mg daily. Tremors of nervous system She has noted to have significant resting tremors on exam.  She may need neurologic evaluation.  I will leave this up to her  primary care provider.        Dispo:  Return in about 8 weeks (around 02/07/2023) for Follow up after testing, w/ Dr. Excell Seltzer, or Tereso Newcomer, PA-C.  Signed, Tereso Newcomer, PA-C

## 2022-12-13 ENCOUNTER — Other Ambulatory Visit (HOSPITAL_COMMUNITY): Payer: Self-pay

## 2022-12-13 ENCOUNTER — Ambulatory Visit: Payer: Medicare Other | Attending: Physician Assistant | Admitting: Physician Assistant

## 2022-12-13 ENCOUNTER — Ambulatory Visit: Payer: Medicare Other | Attending: Physician Assistant

## 2022-12-13 ENCOUNTER — Encounter: Payer: Self-pay | Admitting: Physician Assistant

## 2022-12-13 ENCOUNTER — Encounter: Payer: Self-pay | Admitting: *Deleted

## 2022-12-13 VITALS — BP 124/76 | HR 72 | Ht 59.0 in

## 2022-12-13 DIAGNOSIS — I10 Essential (primary) hypertension: Secondary | ICD-10-CM | POA: Diagnosis not present

## 2022-12-13 DIAGNOSIS — I350 Nonrheumatic aortic (valve) stenosis: Secondary | ICD-10-CM | POA: Diagnosis not present

## 2022-12-13 DIAGNOSIS — I480 Paroxysmal atrial fibrillation: Secondary | ICD-10-CM | POA: Diagnosis not present

## 2022-12-13 DIAGNOSIS — I2510 Atherosclerotic heart disease of native coronary artery without angina pectoris: Secondary | ICD-10-CM

## 2022-12-13 DIAGNOSIS — R072 Precordial pain: Secondary | ICD-10-CM

## 2022-12-13 DIAGNOSIS — E785 Hyperlipidemia, unspecified: Secondary | ICD-10-CM

## 2022-12-13 DIAGNOSIS — R251 Tremor, unspecified: Secondary | ICD-10-CM

## 2022-12-13 MED ORDER — METOPROLOL SUCCINATE ER 25 MG PO TB24
12.5000 mg | ORAL_TABLET | Freq: Every day | ORAL | 3 refills | Status: DC
  Filled 2022-12-13: qty 45, 90d supply, fill #0

## 2022-12-13 NOTE — Progress Notes (Unsigned)
[employer] event monitor serial # [id number] applied to patient in office.  Dr. Excell Seltzer to read.

## 2022-12-13 NOTE — Patient Instructions (Addendum)
Medication Instructions:  Your physician has recommended you make the following change in your medication:   START Toprol XL 25 taking 1/2 tablet daily  *If you need a refill on your cardiac medications before your next appointment, please call your pharmacy*   Lab Work: None ordered  If you have labs (blood work) drawn today and your tests are completely normal, you will receive your results only by: MyChart Message (if you have MyChart) OR A paper copy in the mail If you have any lab test that is abnormal or we need to change your treatment, we will call you to review the results.   Testing/Procedures: Your physician has requested that you have an echocardiogram. Echocardiography is a painless test that uses sound waves to create images of your heart. It provides your doctor with information about the size and shape of your heart and how well your heart's chambers and valves are working. This procedure takes approximately one hour. There are no restrictions for this procedure. Please do NOT wear cologne, perfume, aftershave, or lotions (deodorant is allowed). Please arrive 15 minutes prior to your appointment time.  Please note: We ask at that you not bring children with you during ultrasound (echo/ vascular) testing. Due to room size and safety concerns, children are not allowed in the ultrasound rooms during exams. Our front office staff cannot provide observation of children in our lobby area while testing is being conducted. An adult accompanying a patient to their appointment will only be allowed in the ultrasound room at the discretion of the ultrasound technician under special circumstances. We apologize for any inconvenience.  [employer] Cardiac Event Monitor Instructions  Your physician has requested you wear your cardiac event monitor for _____ days, (1-30). [employer] may call or text to confirm a shipping address. The monitor will be sent to a land address via UPS. [employer]  will not ship a monitor to a PO BOX. It typically takes 3-5 days to receive your monitor after it has been enrolled. [employer] will assist with USPS tracking if your package is delayed. The telephone number for [employer] is [phone]. Once you have received your monitor, please review the enclosed instructions. Instruction tutorials can also be viewed under help and settings on the enclosed cell phone. Your monitor has already been registered assigning a specific monitor serial # to you.  Billing and Self Pay Discount Information  [employer] has been provided the insurance information we had on file for you.  If your insurance has been updated, please call [employer] at [phone] to provide them with your updated insurance information.   [employer] offers a discounted Self Pay option for patients who have insurance that does not cover their cardiac event monitor or patients without insurance.  The discounted cost of a Self Pay Cardiac Event Monitor would be $225.00 , if the patient contacts [employer] at [phone] within 7 days of applying the monitor to make payment arrangements.  If the patient does not contact [employer] within 7 days of applying the monitor, the cost of the cardiac event monitor will be $350.00.  Applying the monitor  Remove cell phone from case and turn it on. The cell phone works as IT consultant and needs to be within [employer] of you at all times. The cell phone will need to be charged on a daily basis. We recommend you plug the cell phone into the enclosed charger at your bedside table every night.  Monitor batteries: You will receive two monitor batteries labelled #1  and #2. These are your recorders. Plug battery #2 onto the second connection on the enclosed charger. Keep one battery on the charger at all times. This will keep the monitor battery deactivated. It will also keep it fully charged for when you need to switch your monitor batteries. A small  light will be blinking on the battery emblem when it is charging. The light on the battery emblem will remain on when the battery is fully charged.  Open package of a Monitor strip. Insert battery #1 into black hood on strip and gently squeeze monitor battery onto connection as indicated in instruction booklet. Set aside while preparing skin.  Choose location for your strip, vertical or horizontal, as indicated in the instruction booklet. Shave to remove all hair from location. There cannot be any lotions, oils, powders, or colognes on skin where monitor is to be applied. Wipe skin clean with enclosed Saline wipe. Dry skin completely.  Peel paper labeled #1 off the back of the Monitor strip exposing the adhesive. Place the monitor on the chest in the vertical or horizontal position shown in the instruction booklet. One arrow on the monitor strip must be pointing upward. Carefully remove paper labeled #2, attaching remainder of strip to your skin. Try not to create any folds or wrinkles in the strip as you apply it.  Firmly press and release the circle in the center of the monitor battery. You will hear a small beep. This is turning the monitor battery on. The heart emblem on the monitor battery will light up every 5 seconds if the monitor battery in turned on and connected to the patient securely. Do not push and hold the circle down as this turns the monitor battery off. The cell phone will locate the monitor battery. A screen will appear on the cell phone checking the connection of your monitor strip. This may read poor connection initially but change to good connection within the next minute. Once your monitor accepts the connection you will hear a series of 3 beeps followed by a climbing crescendo of beeps. A screen will appear on the cell phone showing the two monitor strip placement options. Touch the picture that demonstrates where you applied the monitor strip.  Your monitor strip and  battery are waterproof. You are able to shower, bathe, or swim with the monitor on. They just ask you do not submerge deeper than 3 feet underwater. We recommend removing the monitor if you are swimming in a lake, river, or ocean.  Your monitor battery will need to be switched to a fully charged monitor battery approximately once a week. The cell phone will alert you of an action which needs to be made.  On the cell phone, tap for details to reveal connection status, monitor battery status, and cell phone battery status. The green dots indicates your monitor is in good status. A red dot indicates there is something that needs your attention.  To record a symptom, click the circle on the monitor battery. In 30-60 seconds a list of symptoms will appear on the cell phone. Select your symptom and tap save. Your monitor will record a sustained or significant arrhythmia regardless of you clicking the button. Some patients do not feel the heart rhythm irregularities. [employer] will notify us of any serious or critical events.  Refer to instruction booklet for instructions on switching batteries, changing strips, the Do not disturb or Pause features, or any additional questions.  Call [employer] at [phone], to confirm your  monitor is transmitting and record your baseline. They will answer any questions you may have regarding the monitor instructions at that time.  Returning the monitor to [employer]  Place all equipment back into blue box. Peel off strip of paper to expose adhesive and close box securely. There is a prepaid UPS shipping label on this box. Drop in a UPS drop box, or at a UPS facility like [employer]. You may also contact [employer] to arrange UPS to pick up monitor package at your home.   Follow-Up: At [facility], you and your health needs are our priority.  As part of our continuing mission to provide you with exceptional heart care, we have created designated  Provider Care Teams.  These Care Teams include your primary Cardiologist (physician) and Advanced Practice Providers (APPs -  Physician Assistants and Nurse Practitioners) who all work together to provide you with the care you need, when you need it.  We recommend signing up for the patient portal called "MyChart".  Sign up information is provided on this After Visit Summary.  MyChart is used to connect with patients for Virtual Visits (Telemedicine).  Patients are able to view lab/test results, encounter notes, upcoming appointments, etc.  Non-urgent messages can be sent to your provider as well.   To learn more about what you can do with MyChart, go to [email or web address].    Your next appointment:   6-8 week(s)  Provider:   [name], MD  or Tereso Newcomer, PA-C         Other Instructions

## 2022-12-13 NOTE — Assessment & Plan Note (Signed)
Blood pressure controlled.  Continue hydrochlorothiazide 12.5 mg daily.

## 2022-12-13 NOTE — Assessment & Plan Note (Signed)
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  Given recent symptoms of chest pain, will update echocardiogram.  Continue SBE prophylaxis.

## 2022-12-13 NOTE — Assessment & Plan Note (Signed)
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  She notes occasional episodes of chest discomfort that is fleeting and sounds noncardiac.  Her EKG does not demonstrate any acute changes today.  She does not appear to be a good candidate for invasive cardiac evaluation such as cardiac catheterization.  Therefore, I am not sure that stress testing will provide much benefit.  I will place her on a very low-dose of beta-blocker to see if this improves her symptoms.  If she continues to have chest pain, consider stress testing. -Continue aspirin 81 mg daily, nitroglycerin as needed, Crestor 40 mg daily -Start Toprol XL 12.5 mg daily -Follow-up 6-8 weeks

## 2022-12-25 ENCOUNTER — Other Ambulatory Visit (HOSPITAL_COMMUNITY): Payer: Self-pay

## 2023-01-18 ENCOUNTER — Ambulatory Visit (HOSPITAL_COMMUNITY): Payer: Medicare Other | Attending: Physician Assistant

## 2023-01-18 DIAGNOSIS — I10 Essential (primary) hypertension: Secondary | ICD-10-CM | POA: Insufficient documentation

## 2023-01-18 DIAGNOSIS — I2510 Atherosclerotic heart disease of native coronary artery without angina pectoris: Secondary | ICD-10-CM | POA: Insufficient documentation

## 2023-01-18 DIAGNOSIS — I480 Paroxysmal atrial fibrillation: Secondary | ICD-10-CM | POA: Diagnosis not present

## 2023-01-18 DIAGNOSIS — I350 Nonrheumatic aortic (valve) stenosis: Secondary | ICD-10-CM | POA: Diagnosis not present

## 2023-01-18 LAB — ECHOCARDIOGRAM COMPLETE
AR max vel: 1.63 cm2
AV Area VTI: 1.54 cm2
AV Area mean vel: 1.62 cm2
AV Mean grad: 6.5 mm[Hg]
AV Peak grad: 12 mm[Hg]
Ao pk vel: 1.73 m/s
Area-P 1/2: 3.98 cm2
S' Lateral: 2.4 cm

## 2023-01-19 ENCOUNTER — Telehealth: Payer: Self-pay

## 2023-01-19 ENCOUNTER — Telehealth: Payer: Self-pay | Admitting: Cardiovascular Disease

## 2023-01-19 NOTE — Telephone Encounter (Addendum)
Spoke to [place] at [place] Faxed previous telephone note with advisement to [phone], attn: Lurena Joiner. Confirmation received.

## 2023-01-19 NOTE — Telephone Encounter (Signed)
Sister [name]) stated she will need the results of patient's test and updated care instructions sent to Dr. Johna Roles at [facility], phone# [phone].  Sister noted [facility] managers - Suzzanne Cloud and Idamae Schuller also sees the patient at the nursing facility.

## 2023-01-19 NOTE — Telephone Encounter (Signed)
[name] sister has been notified of the result and verbalized understanding.  All questions (if any) were answered. Frutoso Schatz, RN 01/19/2023 11:50 AM

## 2023-01-19 NOTE — Telephone Encounter (Signed)
-----   Message from Nicolasa Ducking sent at 01/18/2023  2:19 PM EST ----- Predominantly normal heart rhythm with an average rate of 79 bpm.  Frequent extra beats from the top chambers (premature atrial contractions) accounting for 27% of total beats, with occasional extra beats from the bottom chambers (premature ventricular contractions) accounting for 2% of total beats.  There were no significant or sustained arrhythmias (specifically, no afib/flutter).  Per Scott's last note, in the setting of no afib/flutter, she should stop Apixaban  Continue metoprolol.  Follow up as planned.

## 2023-02-04 ENCOUNTER — Other Ambulatory Visit: Payer: Self-pay

## 2023-02-04 ENCOUNTER — Observation Stay (HOSPITAL_COMMUNITY)
Admission: EM | Admit: 2023-02-04 | Discharge: 2023-02-05 | Disposition: A | Discharge status: Other Acute Inpatient Hospital | Payer: Medicare Other | Attending: Family Medicine | Admitting: Family Medicine

## 2023-02-04 ENCOUNTER — Emergency Department (HOSPITAL_COMMUNITY): Payer: Medicare Other

## 2023-02-04 ENCOUNTER — Encounter (HOSPITAL_COMMUNITY): Payer: Self-pay

## 2023-02-04 DIAGNOSIS — R4182 Altered mental status, unspecified: Secondary | ICD-10-CM | POA: Diagnosis present

## 2023-02-04 DIAGNOSIS — Z79899 Other long term (current) drug therapy: Secondary | ICD-10-CM | POA: Insufficient documentation

## 2023-02-04 DIAGNOSIS — Z8659 Personal history of other mental and behavioral disorders: Secondary | ICD-10-CM

## 2023-02-04 DIAGNOSIS — R9431 Abnormal electrocardiogram [ECG] [EKG]: Secondary | ICD-10-CM | POA: Insufficient documentation

## 2023-02-04 DIAGNOSIS — Z7984 Long term (current) use of oral hypoglycemic drugs: Secondary | ICD-10-CM | POA: Diagnosis not present

## 2023-02-04 DIAGNOSIS — F0390 Unspecified dementia without behavioral disturbance: Secondary | ICD-10-CM | POA: Insufficient documentation

## 2023-02-04 DIAGNOSIS — Z7982 Long term (current) use of aspirin: Secondary | ICD-10-CM | POA: Insufficient documentation

## 2023-02-04 DIAGNOSIS — J069 Acute upper respiratory infection, unspecified: Secondary | ICD-10-CM | POA: Diagnosis not present

## 2023-02-04 DIAGNOSIS — R079 Chest pain, unspecified: Secondary | ICD-10-CM | POA: Diagnosis not present

## 2023-02-04 DIAGNOSIS — I5032 Chronic diastolic (congestive) heart failure: Secondary | ICD-10-CM

## 2023-02-04 DIAGNOSIS — Z1152 Encounter for screening for COVID-19: Secondary | ICD-10-CM | POA: Insufficient documentation

## 2023-02-04 DIAGNOSIS — Z955 Presence of coronary angioplasty implant and graft: Secondary | ICD-10-CM | POA: Insufficient documentation

## 2023-02-04 DIAGNOSIS — B342 Coronavirus infection, unspecified: Secondary | ICD-10-CM | POA: Insufficient documentation

## 2023-02-04 DIAGNOSIS — Z8679 Personal history of other diseases of the circulatory system: Secondary | ICD-10-CM

## 2023-02-04 DIAGNOSIS — E119 Type 2 diabetes mellitus without complications: Secondary | ICD-10-CM | POA: Insufficient documentation

## 2023-02-04 DIAGNOSIS — I480 Paroxysmal atrial fibrillation: Secondary | ICD-10-CM

## 2023-02-04 DIAGNOSIS — I10 Essential (primary) hypertension: Secondary | ICD-10-CM | POA: Diagnosis present

## 2023-02-04 DIAGNOSIS — Z96652 Presence of left artificial knee joint: Secondary | ICD-10-CM | POA: Insufficient documentation

## 2023-02-04 DIAGNOSIS — I2510 Atherosclerotic heart disease of native coronary artery without angina pectoris: Secondary | ICD-10-CM | POA: Insufficient documentation

## 2023-02-04 DIAGNOSIS — Z952 Presence of prosthetic heart valve: Secondary | ICD-10-CM | POA: Diagnosis not present

## 2023-02-04 DIAGNOSIS — R1010 Upper abdominal pain, unspecified: Secondary | ICD-10-CM | POA: Diagnosis not present

## 2023-02-04 DIAGNOSIS — G629 Polyneuropathy, unspecified: Secondary | ICD-10-CM | POA: Diagnosis not present

## 2023-02-04 DIAGNOSIS — Z96611 Presence of right artificial shoulder joint: Secondary | ICD-10-CM | POA: Diagnosis not present

## 2023-02-04 DIAGNOSIS — Z7901 Long term (current) use of anticoagulants: Secondary | ICD-10-CM | POA: Diagnosis not present

## 2023-02-04 DIAGNOSIS — I110 Hypertensive heart disease with heart failure: Secondary | ICD-10-CM | POA: Insufficient documentation

## 2023-02-04 DIAGNOSIS — Z91040 Latex allergy status: Secondary | ICD-10-CM | POA: Insufficient documentation

## 2023-02-04 LAB — URINALYSIS, ROUTINE W REFLEX MICROSCOPIC
Bilirubin Urine: NEGATIVE
Glucose, UA: NEGATIVE mg/dL
Hgb urine dipstick: NEGATIVE
Ketones, ur: NEGATIVE mg/dL
Leukocytes,Ua: NEGATIVE
Nitrite: NEGATIVE
Protein, ur: NEGATIVE mg/dL
Specific Gravity, Urine: 1.017 (ref 1.005–1.030)
pH: 7 (ref 5.0–8.0)

## 2023-02-04 LAB — CBC WITH DIFFERENTIAL/PLATELET
Abs Immature Granulocytes: 0.03 10*3/uL (ref 0.00–0.07)
Basophils Absolute: 0 10*3/uL (ref 0.0–0.1)
Basophils Relative: 0 %
Eosinophils Absolute: 0.1 10*3/uL (ref 0.0–0.5)
Eosinophils Relative: 1 %
HCT: 36.1 % (ref 36.0–46.0)
Hemoglobin: 11.7 g/dL — ABNORMAL LOW (ref 12.0–15.0)
Immature Granulocytes: 0 %
Lymphocytes Relative: 16 %
Lymphs Abs: 1.5 10*3/uL (ref 0.7–4.0)
MCH: 30.8 pg (ref 26.0–34.0)
MCHC: 32.4 g/dL (ref 30.0–36.0)
MCV: 95 fL (ref 80.0–100.0)
Monocytes Absolute: 1 10*3/uL (ref 0.1–1.0)
Monocytes Relative: 11 %
Neutro Abs: 6.3 10*3/uL (ref 1.7–7.7)
Neutrophils Relative %: 72 %
Platelets: 282 10*3/uL (ref 150–400)
RBC: 3.8 MIL/uL — ABNORMAL LOW (ref 3.87–5.11)
RDW: 13.7 % (ref 11.5–15.5)
WBC: 8.9 10*3/uL (ref 4.0–10.5)
nRBC: 0 % (ref 0.0–0.2)

## 2023-02-04 LAB — COMPREHENSIVE METABOLIC PANEL
ALT: 13 U/L (ref 0–44)
AST: 23 U/L (ref 15–41)
Albumin: 3.2 g/dL — ABNORMAL LOW (ref 3.5–5.0)
Alkaline Phosphatase: 47 U/L (ref 38–126)
Anion gap: 13 (ref 5–15)
BUN: 13 mg/dL (ref 8–23)
CO2: 25 mmol/L (ref 22–32)
Calcium: 9.1 mg/dL (ref 8.9–10.3)
Chloride: 100 mmol/L (ref 98–111)
Creatinine, Ser: 0.54 mg/dL (ref 0.44–1.00)
GFR, Estimated: >60 mL/min (ref >60)
Glucose, Bld: 93 mg/dL (ref 70–99)
Potassium: 4.3 mmol/L (ref 3.5–5.1)
Sodium: 138 mmol/L (ref 135–145)
Total Bilirubin: 0.8 mg/dL (ref 0.0–1.2)
Total Protein: 6.1 g/dL — ABNORMAL LOW (ref 6.5–8.1)

## 2023-02-04 LAB — RESP PANEL BY RT-PCR (RSV, FLU A&B, COVID)  RVPGX2
Influenza A by PCR: NEGATIVE
Influenza B by PCR: NEGATIVE
Resp Syncytial Virus by PCR: NEGATIVE
SARS Coronavirus 2 by RT PCR: NEGATIVE

## 2023-02-04 LAB — I-STAT CG4 LACTIC ACID, ED: Lactic Acid, Venous: 1 mmol/L (ref 0.5–1.9)

## 2023-02-04 LAB — TROPONIN I (HIGH SENSITIVITY)
Troponin I (High Sensitivity): 6 ng/L (ref <18)
Troponin I (High Sensitivity): 8 ng/L (ref <18)

## 2023-02-04 LAB — LIPASE, BLOOD: Lipase: 21 U/L (ref 11–51)

## 2023-02-04 LAB — CBG MONITORING, ED: Glucose-Capillary: 99 mg/dL (ref 70–99)

## 2023-02-04 MED ORDER — IOHEXOL 350 MG/ML SOLN
75.0000 mL | Freq: Once | INTRAVENOUS | Status: AC | PRN
  Administered 2023-02-04: 75 mL via INTRAVENOUS

## 2023-02-04 NOTE — ED Triage Notes (Signed)
 Patient received from EMS triage to room 28. Currently, [facility] 13; unknown baseline.

## 2023-02-04 NOTE — ED Notes (Signed)
 Patient transported to CT

## 2023-02-04 NOTE — ED Provider Notes (Signed)
 [place] [facility] Provider Note   CSN: [id number] Arrival date & time: 02/04/23  1925     History  Chief Complaint  Patient presents with   Altered Mental Status    Katherine [name] is a 78 y.o. female status post TAVR, history of aortic atherosclerosis, CAD, hypertension, diabetes, A-fib not currently on anticoagulation presented for altered mental status.  Patient is able to give limited history due to his altered mental status.  Patient does endorse vague chest pain along with urinary discomfort.  Patient is not able to give further history.  According to triage note patient was altered today with low O2 sats.  I tried contacting the nursing facility however nobody picked up.  Home Medications Prior to Admission medications   Medication Sig Start Date End Date Taking? Authorizing Provider  acetaminophen  (TYLENOL ) 500 MG tablet Take 500 mg by mouth every 6 (six) hours as needed for mild pain or headache.   Yes [provider]  acetaminophen -codeine (TYLENOL  #3) 300-30 MG tablet Take 1 tablet by mouth every 6 (six) hours as needed (tooth pain).   Yes [provider]  alendronate  (FOSAMAX ) 70 MG tablet Take 1 tablet (70 mg total) by mouth every Monday. 06/06/21  Yes Medina-Vargas, Monina C, NP  apixaban  (ELIQUIS ) 5 MG TABS tablet Take 5 mg by mouth 2 (two) times daily.   Yes [provider]  ascorbic acid  (VITAMIN C) 500 MG tablet Take 500 mg by mouth daily.   Yes [provider]  aspirin  EC 81 MG tablet Take 1 tablet (81 mg total) by mouth every other day. 06/03/21  Yes Medina-Vargas, Monina C, NP  benztropine  (COGENTIN ) 0.5 MG tablet Take 1 tablet (0.5 mg total) by mouth in the morning and at bedtime. 06/03/21  Yes Medina-Vargas, Monina C, NP  bisacodyl  (DULCOLAX) 10 MG suppository Place 10 mg rectally daily as needed for moderate constipation.   Yes [provider]  calcium -vitamin D  (OSCAL WITH D) 500-200  MG-UNIT tablet Take 1 tablet by mouth 3 (three) times daily. 02/25/15  Yes Norleen Lynwood ORN, MD  fluticasone  (FLONASE ) 50 MCG/ACT nasal spray Place 2 sprays into both nostrils daily. 06/03/21  Yes Medina-Vargas, Monina C, NP  gabapentin  (NEURONTIN ) 100 MG capsule Take 2 capsules (200 mg total) by mouth 2 (two) times daily. Take 200 mg by mouth in the morning and 100 mg at bedtime Patient taking differently: Take 200 mg by mouth 3 (three) times daily. 06/03/21  Yes Medina-Vargas, Monina C, NP  hydrochlorothiazide  (MICROZIDE ) 12.5 MG capsule Take 1 capsule (12.5 mg total) by mouth in the morning. 06/03/21  Yes Medina-Vargas, Monina C, NP  ipratropium (ATROVENT ) 0.06 % nasal spray USE 1 SPRAY IN EACH NOSTRIL THREE TIMES DAILY 06/03/21  Yes Medina-Vargas, Monina C, NP  magnesium  oxide (MAG-OX) 400 (240 Mg) MG tablet Take 400 mg by mouth daily.   Yes [provider]  metFORMIN  (GLUCOPHAGE ) 500 MG tablet Take 1 tablet (500 mg total) by mouth in the morning and at bedtime. 06/03/21  Yes Medina-Vargas, Monina C, NP  metoprolol  succinate (TOPROL  XL) 25 MG 24 hr tablet Take 1/2 tablet (12.5 mg total) by mouth daily. 12/13/22  Yes Weaver, Scott T, PA-C  Multiple Vitamin (MULTIVITAMIN WITH MINERALS) TABS tablet Take 1 tablet by mouth daily with breakfast.   Yes [provider]  NAMZARIC  28-10 MG CP24 Take 1 capsule by mouth in the morning. 06/03/21  Yes Medina-Vargas, Monina C, NP  nitroGLYCERIN  (NITROSTAT )  0.4 MG SL tablet DISSOLVE 1 TABLET UNDER THE TONGUE EVERY 5 MINUTES AS NEEDED FOR CHEST PAIN AS DIRECTED 06/03/21  Yes Medina-Vargas, Monina C, NP  Nutritional Supplements (NUTRITIONAL DRINK) LIQD Take 1 Container by mouth daily. [employer]   Yes [provider]  Omega-3 1000 MG CAPS Take 1,000 mg by mouth every Monday, Wednesday, and Friday.   Yes [provider]  pantoprazole  (PROTONIX ) 40 MG tablet Take 1 tablet (40 mg total) by mouth daily. 06/03/21  Yes Medina-Vargas, Monina C, NP  Potassium  Chloride ER 20 MEQ TBCR Take 20 mEq by mouth in the morning and at bedtime. 06/03/21  Yes Medina-Vargas, Monina C, NP  risperiDONE  (RISPERDAL ) 2 MG tablet Take 2 mg by mouth at bedtime. 09/22/22  Yes [provider]  rosuvastatin  (CRESTOR ) 40 MG tablet Take 1 tablet orally daily at 6 PM. 06/03/21  Yes Medina-Vargas, Monina C, NP  Zinc  Oxide 10 % AERO Apply 1 application  topically 2 (two) times daily. Incontinent care.   Yes [provider]  NON FORMULARY Diet:Heart Healthy/CCD/thin liquids    [provider]      Allergies    Penicillins, Euphorbia, Pollen extract, Coconut (cocos nucifera), Coconut fatty acid, Latex, and Mellaril [thioridazine]    Review of Systems   Review of Systems  Physical Exam Updated Vital Signs BP 106/60   Pulse 63   Temp 98.3 F (36.8 C) (Axillary)   Resp 12   Ht 4' 11 (1.499 m)   Wt 55 kg   SpO2 95%   BMI 24.49 kg/m  Physical Exam Constitutional:      General: She is not in acute distress.    Appearance: She is not ill-appearing.     Comments: Clearly altered Only able to answer few questions with short responses  Eyes:     Conjunctiva/sclera: Conjunctivae normal.     Pupils: Pupils are equal, round, and reactive to light.  Cardiovascular:     Rate and Rhythm: Normal rate. Rhythm irregular.     Pulses: Normal pulses.     Heart sounds: Murmur (Right second intercostal space, chronic) heard.  Pulmonary:     Effort: Pulmonary effort is normal. No respiratory distress.     Breath sounds: Normal breath sounds.  Abdominal:     General: There is no distension.     Palpations: Abdomen is soft.     Comments: Upper abdominal tenderness without peritoneal signs  Musculoskeletal:        General: Normal range of motion.     Comments: Negative logroll bilaterally  Skin:    General: Skin is warm and dry.     Capillary Refill: Capillary refill takes less than 2 seconds.  Neurological:     Comments: Cannot conduct a full neurologic  exam as patient was unable to follow commands     ED Results / Procedures / Treatments   Labs (all labs ordered are listed, but only abnormal results are displayed) Labs Reviewed  COMPREHENSIVE METABOLIC PANEL - Abnormal; Notable for the following components:      Result Value   Total Protein 6.1 (*)    Albumin 3.2 (*)    All other components within normal limits  CBC WITH DIFFERENTIAL/PLATELET - Abnormal; Notable for the following components:   RBC 3.80 (*)    Hemoglobin 11.7 (*)    All other components within normal limits  RESP PANEL BY RT-PCR (RSV, FLU A&B, COVID)  RVPGX2  URINALYSIS, ROUTINE W REFLEX MICROSCOPIC  LIPASE, BLOOD  RAPID URINE DRUG SCREEN, HOSP PERFORMED  CBG MONITORING, ED  CBG MONITORING, ED  I-STAT CG4 LACTIC ACID, ED  I-STAT CG4 LACTIC ACID, ED  TROPONIN I (HIGH SENSITIVITY)  TROPONIN I (HIGH SENSITIVITY)    EKG EKG Interpretation Date/Time:  Sunday February 04 2023 20:05:52 EST Ventricular Rate:  89 PR Interval:  137 QRS Duration:  91 QT Interval:  435 QTC Calculation: 530 R Axis:   -63  Text Interpretation: Sinus arrhythmia Inferior infarct, old Consider anterior infarct Prolonged QT interval Confirmed by Franklyn Gills [phone]) on 02/04/2023 8:08:06 PM  Radiology CT HEAD WO CONTRAST Result Date: 02/04/2023 CLINICAL DATA:  Altered mental status.  Found unresponsive. EXAM: CT HEAD WITHOUT CONTRAST TECHNIQUE: Contiguous axial images were obtained from the base of the skull through the vertex without intravenous contrast. RADIATION DOSE REDUCTION: This exam was performed according to the departmental dose-optimization program which includes automated exposure control, adjustment of the mA and/or kV according to patient size and/or use of iterative reconstruction technique. COMPARISON:  CT head 09/29/2022. FINDINGS: Brain: No evidence of acute large vascular territory infarction, hemorrhage, hydrocephalus, extra-axial collection or mass lesion/mass effect.  Vascular: No hyperdense vessel. Skull: No acute fracture. Sinuses/Orbits: Clear sinuses. IMPRESSION: No evidence of acute intracranial abnormality. Electronically Signed   By: Gilmore GORMAN Molt M.D.   On: 02/04/2023 22:27   CT ABDOMEN PELVIS W CONTRAST Result Date: 02/04/2023 CLINICAL DATA:  Found unresponsive EXAM: CT ABDOMEN AND PELVIS WITH CONTRAST TECHNIQUE: Multidetector CT imaging of the abdomen and pelvis was performed using the standard protocol following bolus administration of intravenous contrast. RADIATION DOSE REDUCTION: This exam was performed according to the departmental dose-optimization program which includes automated exposure control, adjustment of the mA and/or kV according to patient size and/or use of iterative reconstruction technique. CONTRAST:  75mL OMNIPAQUE  IOHEXOL  350 MG/ML SOLN COMPARISON:  04/03/19 FINDINGS: Lower chest: No acute abnormality.  Changes of prior TAVR are noted. Hepatobiliary: No focal liver abnormality is seen. No gallstones, gallbladder wall thickening, or biliary dilatation. Pancreas: Unremarkable. No pancreatic ductal dilatation or surrounding inflammatory changes. Spleen: Normal in size without focal abnormality. Adrenals/Urinary Tract: Adrenal glands are within normal limits. Kidneys demonstrate a normal enhancement pattern bilaterally. No renal calculi or obstructive changes are noted. The bladder is well distended. Stomach/Bowel: Diverticular change of the colon is noted. No obstructive or inflammatory changes of colon are seen. The appendix is within normal limits. Small bowel and stomach are unremarkable. Vascular/Lymphatic: Aortic atherosclerosis. No enlarged abdominal or pelvic lymph nodes. Reproductive: Status post hysterectomy. No adnexal masses. Other: No abdominal wall hernia or abnormality. No abdominopelvic ascites. Musculoskeletal: Multiple old healed pelvic fractures are seen in the pubic rami bilaterally. Right sacral healed fracture is noted as well.  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 IMPRESSION: Diverticulosis without diverticulitis. No acute abnormality noted. Electronically Signed   By: Oneil Devonshire M.D.   On: 02/04/2023 22:21   DG Chest Port 1 View Result Date: 02/04/2023 CLINICAL DATA:  Low saturations and altered mental status EXAM: PORTABLE CHEST 1 VIEW COMPARISON:  09/29/2022 FINDINGS: Low lung volumes accentuate cardiomediastinal silhouette and pulmonary vascularity. TAVR. Coronary stenting. Cardiomegaly. Pulmonary vascular congestion. Similar scarring in the left mid lung. No focal consolidation, pleural effusion, or pneumothorax. Right reverse TSA. IMPRESSION: Cardiomegaly with pulmonary vascular congestion. Electronically Signed   By: Norman Gatlin M.D.   On: 02/04/2023 20:49    Procedures Procedures    Medications Ordered in ED Medications  iohexol  (OMNIPAQUE ) 350 MG/ML injection 75  mL (75 mLs Intravenous Contrast Given 02/04/23 2214)    ED Course/ Medical Decision Making/ A&P                                 Medical Decision Making Amount and/or Complexity of Data Reviewed Labs: ordered. Radiology: ordered.  Risk Prescription drug management. Decision regarding hospitalization.   Katherine [name] 78 y.o. presented today for AMS. Working DDx that I considered at this time includes, but not limited to, CVA, ICH, intracranial mass, critical dehydration, heptatic dysfunction, uremia, hypercarbia/hypoxia/COPD exacerbation, intoxication, endocrine abnormality, toxidrome, adrenal insufficiency, hypoglycemia/hyperglycemia, paraneoplastic process, CNS infection, meningitis.  R/o DDx: CVA, ICH, intracranial mass, critical dehydration, heptatic dysfunction, uremia, hypercarbia/hypoxia/COPD exacerbation, intoxication, endocrine abnormality, toxidrome, adrenal insufficiency, hypoglycemia/hyperglycemia, paraneoplastic process, CNS infection, meningitis: These work and that it however unlikely due to  patient's history, physical exam, presentation, lab/imaging findings  Review of prior external notes: 04/23/2021 ED to hospital discharge  Unique Tests and My Interpretation:  CT Head wo Contrast: No acute findings CT abdomen pelvis with contrast: Diverticulosis without diverticulitis CBC: Unremarkable CMP: Unremarkable UA: Unremarkable CXR: Unremarkable Troponin: 6, 8 CBG: 99 Lactic acid: Unremarkable EKG: Rate, rhythm, axis, intervals all examined and without medically relevant abnormality. ST segments without concerns for elevations  Social Determinants of Health: none  Discussion with Independent Historian:  Family members  Discussion of Management of Tests: None  Risk: High: hospitalization or escalation of hospital-level care  Risk Stratification Score: None  Staffed with Franklyn, MD  Plan: On exam patient was in no acute distress with stable vitals however was altered on exam.  Patient was only able to give short responses to certain questions but on exam did have upper abdominal tenderness with palpation.  I tried contacting the nursing facility however nobody picked up but triage note states that patient was desatting and altered today which brought them in.  Given patient's reported history will obtain chest x-ray along with abdominal labs due to patient's abdominal tenderness on exam.  Will also get a urine and a Trope as patient did endorse vague chest pain as well. Nephew visited and him and his wife states that this is different than her baseline.  Patient's labs and imaging all came back reassuring with no reason for patient's altered mental status.  After discussion with the attending we agreed patient would benefit from admission so we will consult hospitalist.  Patient stable for admission.  Patient signed out to Rodessa, PA-C.  Please review their note for the continuation of patient's care.  The plan at this point is admit.  This chart was dictated using voice  recognition software.  Despite best efforts to proofread,  errors can occur which can change the documentation meaning.         Final Clinical Impression(s) / ED Diagnoses Final diagnoses:  Altered mental status, unspecified altered mental status type  Prolonged Q-T interval on ECG    Rx / DC Orders ED Discharge Orders     None         Victor Lynwood ONEIDA DEVONNA 02/05/23 0010    Franklyn Sid SAILOR, MD 02/07/23 [phone]

## 2023-02-04 NOTE — ED Triage Notes (Signed)
 Pt BIB [facility] from [facility] d/t low sats and AMS.  Per EMS she is normally communicative.

## 2023-02-04 NOTE — ED Notes (Signed)
 [facility]CMD called.

## 2023-02-05 ENCOUNTER — Observation Stay (HOSPITAL_BASED_OUTPATIENT_CLINIC_OR_DEPARTMENT_OTHER): Payer: Medicare Other

## 2023-02-05 ENCOUNTER — Observation Stay (HOSPITAL_COMMUNITY): Payer: Medicare Other

## 2023-02-05 ENCOUNTER — Encounter (HOSPITAL_COMMUNITY): Payer: Self-pay | Admitting: Internal Medicine

## 2023-02-05 DIAGNOSIS — J069 Acute upper respiratory infection, unspecified: Secondary | ICD-10-CM | POA: Diagnosis not present

## 2023-02-05 DIAGNOSIS — R4182 Altered mental status, unspecified: Secondary | ICD-10-CM | POA: Diagnosis not present

## 2023-02-05 DIAGNOSIS — I480 Paroxysmal atrial fibrillation: Secondary | ICD-10-CM

## 2023-02-05 DIAGNOSIS — G629 Polyneuropathy, unspecified: Secondary | ICD-10-CM

## 2023-02-05 DIAGNOSIS — R569 Unspecified convulsions: Secondary | ICD-10-CM | POA: Diagnosis not present

## 2023-02-05 DIAGNOSIS — Z8679 Personal history of other diseases of the circulatory system: Secondary | ICD-10-CM

## 2023-02-05 DIAGNOSIS — R079 Chest pain, unspecified: Secondary | ICD-10-CM | POA: Diagnosis not present

## 2023-02-05 DIAGNOSIS — I5032 Chronic diastolic (congestive) heart failure: Secondary | ICD-10-CM

## 2023-02-05 DIAGNOSIS — Z8659 Personal history of other mental and behavioral disorders: Secondary | ICD-10-CM

## 2023-02-05 LAB — ECHOCARDIOGRAM LIMITED
Height: 59 in
S' Lateral: 3.1 cm
Weight: 1940.05 [oz_av]

## 2023-02-05 LAB — RAPID URINE DRUG SCREEN, HOSP PERFORMED
Amphetamines: NOT DETECTED
Barbiturates: NOT DETECTED
Benzodiazepines: NOT DETECTED
Cocaine: NOT DETECTED
Opiates: NOT DETECTED
Tetrahydrocannabinol: NOT DETECTED

## 2023-02-05 LAB — BASIC METABOLIC PANEL
Anion gap: 12 (ref 5–15)
BUN: 13 mg/dL (ref 8–23)
CO2: 25 mmol/L (ref 22–32)
Calcium: 8.6 mg/dL — ABNORMAL LOW (ref 8.9–10.3)
Chloride: 100 mmol/L (ref 98–111)
Creatinine, Ser: 0.61 mg/dL (ref 0.44–1.00)
GFR, Estimated: >60 mL/min (ref >60)
Glucose, Bld: 84 mg/dL (ref 70–99)
Potassium: 3.8 mmol/L (ref 3.5–5.1)
Sodium: 137 mmol/L (ref 135–145)

## 2023-02-05 LAB — RESPIRATORY PANEL BY PCR

## 2023-02-05 LAB — TSH: TSH: 0.817 u[IU]/mL (ref 0.350–4.500)

## 2023-02-05 LAB — AMMONIA: Ammonia: 23 umol/L (ref 9–35)

## 2023-02-05 LAB — CBC
HCT: 33.6 % — ABNORMAL LOW (ref 36.0–46.0)
Hemoglobin: 10.6 g/dL — ABNORMAL LOW (ref 12.0–15.0)
MCH: 30.9 pg (ref 26.0–34.0)
MCHC: 31.5 g/dL (ref 30.0–36.0)
MCV: 98 fL (ref 80.0–100.0)
Platelets: 270 10*3/uL (ref 150–400)
RBC: 3.43 MIL/uL — ABNORMAL LOW (ref 3.87–5.11)
RDW: 13.7 % (ref 11.5–15.5)
WBC: 6.4 10*3/uL (ref 4.0–10.5)
nRBC: 0 % (ref 0.0–0.2)

## 2023-02-05 LAB — VITAMIN B12: Vitamin B-12: 283 pg/mL (ref 180–914)

## 2023-02-05 LAB — BRAIN NATRIURETIC PEPTIDE: B Natriuretic Peptide: 59.3 pg/mL (ref 0.0–100.0)

## 2023-02-05 MED ORDER — APIXABAN 5 MG PO TABS: 5.0000 mg | ORAL_TABLET | Freq: Two times a day (BID) | ORAL | Status: DC

## 2023-02-05 MED ORDER — METOPROLOL SUCCINATE ER 25 MG PO TB24
12.5000 mg | ORAL_TABLET | Freq: Every day | ORAL | Status: DC
  Filled 2023-02-05: qty 1

## 2023-02-05 MED ORDER — MEMANTINE HCL ER 28 MG PO CP24
28.0000 mg | ORAL_CAPSULE | Freq: Every day | ORAL | Status: DC
  Administered 2023-02-05: 28 mg via ORAL
  Filled 2023-02-05: qty 1

## 2023-02-05 MED ORDER — HYDROCHLOROTHIAZIDE 12.5 MG PO CAPS
12.5000 mg | ORAL_CAPSULE | Freq: Every day | ORAL | Status: DC
  Filled 2023-02-05: qty 1

## 2023-02-05 MED ORDER — MEMANTINE HCL-DONEPEZIL HCL ER 28-10 MG PO CP24: 1.0000 | ORAL_CAPSULE | Freq: Every morning | ORAL | Status: DC

## 2023-02-05 MED ORDER — ASPIRIN 81 MG PO TBEC: 81.0000 mg | DELAYED_RELEASE_TABLET | ORAL | Status: DC

## 2023-02-05 MED ORDER — ACETAMINOPHEN 325 MG PO TABS
650.0000 mg | ORAL_TABLET | Freq: Four times a day (QID) | ORAL | Status: DC | PRN
  Administered 2023-02-05: 650 mg via ORAL
  Filled 2023-02-05: qty 2

## 2023-02-05 MED ORDER — ACETAMINOPHEN 650 MG RE SUPP
650.0000 mg | Freq: Four times a day (QID) | RECTAL | Status: DC | PRN
Start: 2023-02-05 — End: 2023-02-05

## 2023-02-05 MED ORDER — RISPERIDONE 2 MG PO TABS
2.0000 mg | ORAL_TABLET | Freq: Every day | ORAL | Status: DC
  Filled 2023-02-05: qty 1

## 2023-02-05 MED ORDER — HYDROCHLOROTHIAZIDE 12.5 MG PO TABS: 12.5000 mg | ORAL_TABLET | Freq: Every day | ORAL | Status: DC

## 2023-02-05 MED ORDER — BENZTROPINE MESYLATE 0.5 MG PO TABS
0.5000 mg | ORAL_TABLET | Freq: Two times a day (BID) | ORAL | Status: DC
  Filled 2023-02-05: qty 1

## 2023-02-05 MED ORDER — SODIUM CHLORIDE 0.9% FLUSH
3.0000 mL | Freq: Two times a day (BID) | INTRAVENOUS | Status: DC
  Administered 2023-02-05 (×2): 3 mL via INTRAVENOUS

## 2023-02-05 MED ORDER — ROSUVASTATIN CALCIUM 20 MG PO TABS
40.0000 mg | ORAL_TABLET | Freq: Every day | ORAL | Status: DC
  Administered 2023-02-05: 40 mg via ORAL
  Filled 2023-02-05: qty 2

## 2023-02-05 MED ORDER — SODIUM CHLORIDE 0.9% FLUSH: 3.0000 mL | INTRAVENOUS | Status: DC | PRN

## 2023-02-05 MED ORDER — DONEPEZIL HCL 10 MG PO TABS: 10.0000 mg | ORAL_TABLET | Freq: Every day | ORAL | Status: DC

## 2023-02-05 MED ORDER — SODIUM CHLORIDE 0.9 % IV SOLN: 250.0000 mL | INTRAVENOUS | Status: DC | PRN

## 2023-02-05 MED ORDER — PANTOPRAZOLE SODIUM 40 MG PO TBEC
40.0000 mg | DELAYED_RELEASE_TABLET | Freq: Every day | ORAL | Status: DC
  Administered 2023-02-05: 40 mg via ORAL
  Filled 2023-02-05: qty 1

## 2023-02-05 NOTE — ED Notes (Signed)
 Patient transported to MRI

## 2023-02-05 NOTE — ED Notes (Signed)
 Report given to [facility], pt to be transported to [facility].

## 2023-02-05 NOTE — Progress Notes (Signed)
 OT Screen Note  Patient Details Name: Katherine [name] MRN: [id number] DOB: 1945/02/23   Cancelled Treatment:    Reason Eval/Treat Not Completed: OT screened, no needs identified, will sign off (Pt from SNF, returning to SNF. PT[facility] en route for pt transport back to facility. OT signing off at this time, pt to return having OT services at facility.)  02/05/2023  AB, OTR/L  Acute Rehabilitation Services  Office: [phone]  Katherine [name] 02/05/2023, 5:01 PM

## 2023-02-05 NOTE — Care Management CC44 (Signed)
 Condition Code 44 Documentation Completed  Patient Details  Name: Katherine [name] MRN: [id number] Date of Birth: 12-28-1945   Condition Code 44 given:  Yes Patient signature on Condition Code 44 notice:  Yes Documentation of 2 MD's agreement:  Yes Code 44 added to claim:  Yes    Debarah Saunas, RN 02/05/2023, 2:17 PM

## 2023-02-05 NOTE — ED Notes (Signed)
 NP Gennette Pac [phone]

## 2023-02-05 NOTE — Care Management Obs Status (Signed)
 MEDICARE OBSERVATION STATUS NOTIFICATION   Patient Details  Name: NICKIA BOESEN MRN: [id number] Date of Birth: [birth year]   Medicare Observation Status Notification Given:  Yes    Oletta Cohn, RN 02/05/2023, 2:17 PM

## 2023-02-05 NOTE — Procedures (Signed)
 Patient Name: Katherine [name]  MRN: [id number]  Epilepsy Attending: Arlin MALVA Krebs  Referring Physician/Provider: Jonel Lonni SQUIBB, MD  Date: 02/05/2023 Duration: 22.57 mins  Patient history: 78yo F with ams getting eeg to evaluate for seizure  Level of alertness: Awake  AEDs during EEG study: None  Technical aspects: This EEG study was done with scalp electrodes positioned according to the 10-20 International system of electrode placement. Electrical activity was reviewed with band pass filter of 1-70Hz , sensitivity of 7 uV/mm, display speed of 92mm/sec with a 60Hz  notched filter applied as appropriate. EEG data were recorded continuously and digitally stored.  Video monitoring was available and reviewed as appropriate.  Description: The posterior dominant rhythm consists of 8 Hz activity of moderate voltage (25-35 uV) seen predominantly in posterior head regions, symmetric and reactive to eye opening and eye closing. EEG showed continuous generalized 3 to 6 Hz theta-delta slowing. Hyperventilation and photic stimulation were not performed.     ABNORMALITY - Continuous slow, generalized  IMPRESSION: This study is suggestive of moderate diffuse encephalopathy. No seizures or epileptiform discharges were seen throughout the recording.  Reyn Faivre O Deandrew Hoecker

## 2023-02-05 NOTE — Discharge Summary (Signed)
 Physician Discharge Summary   Patient: Katherine [name] MRN: [id number] DOB: 1945-10-31  Admit date:     02/04/2023  Discharge date: 02/05/23  Discharge Physician: Lonni SHAUNNA Dalton   PCP: Elvera Ozell SAUNDERS, PA-C     Recommendations at discharge:  Follow up with PCP in 1 week for URI [facility]: Please provide supportive care for upper respiratory infection.  No change to home medicines.     Discharge Diagnoses: Principal Problem:   Upper respiratory infection Active Problems:   Mild delirium due to URI   Dementia   History of present aortic stenosis status post TAVR March 2021   Chronic diastolic CHF (congestive heart failure) (HCC)   Essential hypertension   Peripheral neuropathy         Hospital Course: 78 y.o. F with AS s/p TAVR 2021, HTN, DM, CAD no PCI, and brief pAF not on AC due to low AF burden who presented with decreased responsiveness, hypoxia.     Upper respiratory infection In the ER, patient had no hypoxia.  Chest x-ray was clear.  Respiratory virus panel positive for coronavirus OC 43. -Supportive care   Mild delirium due to URI The patient was slightly disoriented in the evening, overnight she was observed, had no further confusion, was at her mental baseline.  MRI brain was obtained that showed no evidence of infarct.  Ammonia, TSH, B12 all normal.  Suspect she had some mild delirium due to the upper respiratory infection              The   Controlled Substances Registry was reviewed for this patient prior to discharge.  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 PATIENT HAS HAD A PCN REACTION WITH IMMEDIATE RASH, FACIAL/TONGUE/THROAT SWELLING, SOB, OR LIGHTHEADEDNESS WITH HYPOTENSION:  #  #  #  YES  #  #  #   Has  patient had a PCN reaction causing severe rash involving mucus membranes or skin necrosis: Unknown Has patient had a PCN reaction that required hospitalization: Unknown Has patient had a PCN reaction occurring within the last 10 years: Unknown   Euphorbia Other (See Comments)   Not listed on MAR   Pollen Extract Other (See Comments)   UNSPECIFIED REACTION, but allergic    Coconut (cocos Nucifera) Itching   Coconut Fatty Acid Itching   Latex Rash   Mellaril [thioridazine] Itching        Medication List     TAKE these medications    acetaminophen  500 MG tablet Commonly known as: TYLENOL  Take 500 mg by mouth every 6 (six) hours as needed for mild pain or headache.   acetaminophen -codeine 300-30 MG tablet Commonly known as: TYLENOL  #3 Take 1 tablet by mouth every 6 (six) hours as needed (tooth pain).   alendronate  70 MG tablet Commonly known as: FOSAMAX  Take 1 tablet (70 mg total) by mouth every Monday.   apixaban  5 MG Tabs tablet Commonly known as: ELIQUIS  Take 5 mg by mouth 2 (two) times daily.   ascorbic acid  500 MG tablet Commonly known as: VITAMIN C Take 500 mg by mouth daily.   aspirin  EC 81 MG tablet Take 1 tablet (81 mg total) by mouth every other day.   benztropine  0.5 MG tablet Commonly known as: COGENTIN  Take 1 tablet (0.5 mg total) by  mouth in the morning and at bedtime.   bisacodyl  10 MG suppository Commonly known as: DULCOLAX Place 10 mg rectally daily as needed for moderate constipation.   calcium -vitamin D  500-200 MG-UNIT tablet Commonly known as: OSCAL WITH D Take 1 tablet by mouth 3 (three) times daily.   fluticasone  50 MCG/ACT nasal spray Commonly known as: FLONASE  Place 2 sprays into both nostrils daily.   gabapentin  100 MG capsule Commonly known as: NEURONTIN  Take 2 capsules (200 mg total) by mouth 2 (two) times daily. Take 200 mg by mouth in the morning and 100 mg at bedtime What changed:  when to take this additional instructions    hydrochlorothiazide  12.5 MG capsule Commonly known as: MICROZIDE  Take 1 capsule (12.5 mg total) by mouth in the morning.   ipratropium 0.06 % nasal spray Commonly known as: ATROVENT  USE 1 SPRAY IN EACH NOSTRIL THREE TIMES DAILY   magnesium  oxide 400 (240 Mg) MG tablet Commonly known as: MAG-OX Take 400 mg by mouth daily.   metFORMIN  500 MG tablet Commonly known as: GLUCOPHAGE  Take 1 tablet (500 mg total) by mouth in the morning and at bedtime.   metoprolol  succinate 25 MG 24 hr tablet Commonly known as: Toprol  XL Take 1/2 tablet (12.5 mg total) by mouth daily.   multivitamin with minerals Tabs tablet Take 1 tablet by mouth daily with breakfast.   Namzaric  28-10 MG Cp24 Generic drug: Memantine  HCl-Donepezil  HCl Take 1 capsule by mouth in the morning.   nitroGLYCERIN  0.4 MG SL tablet Commonly known as: NITROSTAT  DISSOLVE 1 TABLET UNDER THE TONGUE EVERY 5 MINUTES AS NEEDED FOR CHEST PAIN AS DIRECTED   NON FORMULARY Diet:Heart Healthy/CCD/thin liquids   Nutritional Drink Liqd Take 1 Container by mouth daily. MedPass   Omega-3 1000 MG Caps Take 1,000 mg by mouth every Monday, Wednesday, and Friday.   pantoprazole  40 MG tablet Commonly known as: PROTONIX  Take 1 tablet (40 mg total) by mouth daily.   Potassium Chloride  ER 20 MEQ Tbcr Take 20 mEq by mouth in the morning and at bedtime.   risperiDONE  2 MG tablet Commonly known as: RISPERDAL  Take 2 mg by mouth at bedtime.   rosuvastatin  40 MG tablet Commonly known as: CRESTOR  Take 1 tablet orally daily at 6 PM.   Zinc  Oxide 10 % Aero Apply 1 application  topically 2 (two) times daily. Incontinent care.        Follow-up Information     Elvera Ozell SAUNDERS, PA-C. Schedule an appointment as soon as possible for a visit in 1 week(s).   Specialty: Cardiology Contact information: [facility]  [address] [place] [address] [phone]                 Discharge Instructions      Increase activity slowly   Complete by: As directed        Discharge Exam: Filed Weights   02/04/23 1948  Weight: 55 kg    General: Pt is alert, awake, not in acute distress, lying in bed, interactive Cardiovascular: RRR, nl S1-S2, no murmurs appreciated.   No LE edema.   Respiratory: Normal respiratory rate and rhythm.  CTAB without rales or wheezes. Abdominal: Abdomen soft and non-tender.  No distension or HSM.   Neuro/Psych: Strength symmetric in upper and lower extremities.  Judgment and insight appear mildly impaired but at baseline.   Condition at discharge: fair  The results of significant diagnostics from this hospitalization (including imaging, microbiology, ancillary and laboratory) are listed  below for reference.   Imaging Studies: ECHOCARDIOGRAM LIMITED Result Date: 02/05/2023    ECHOCARDIOGRAM LIMITED REPORT   Patient Name:   Katherine [name] [facility] Date of Exam: 02/05/2023 Medical Rec #:  [id number]         Height:       59.0 in Accession #:    [id number]        Weight:       121.3 lb Date of Birth:  September 16, 1945          BSA:          1.491 m Patient Age:    77 years          BP:           135/95 mmHg Patient Gender: F                 HR:           82 bpm. Exam Location:  Inpatient Procedure: 2D Echo, Cardiac Doppler and Color Doppler Indications:    chest pain  History:        Patient has no prior history of Echocardiogram examinations,                 most recent 01/18/2023. Risk Factors:Hypertension.                 Aortic Valve: 26 mm Ultra, stented (TAVR) valve is present in                 the aortic position. Procedure Date: 03/2019.  Sonographer:    Melissa Morford RDCS (AE, PE) Referring Phys: SUBRINA SUNDIL IMPRESSIONS  1. Left ventricular ejection fraction, by estimation, is 60 to 65%. The left ventricle has normal function. The left ventricle has no regional wall motion abnormalities.  2. Right ventricular systolic function is hyperdynamic. The right ventricular size is mildly  enlarged.  3. Aortic valve gradients were not interrogated. No perivalvular leak.. The aortic valve has been repaired/replaced. There is a 26 mm Ultra, stented (TAVR) valve present in the aortic position. Procedure Date: 03/2019. Comparison(s): No significant change from prior study. 01/18/2023: LVEF 60-65%. FINDINGS  Left Ventricle: Left ventricular ejection fraction, by estimation, is 60 to 65%. The left ventricle has normal function. The left ventricle has no regional wall motion abnormalities. The left ventricular internal cavity size was normal in size. There is  no left ventricular hypertrophy. Right Ventricle: The right ventricular size is mildly enlarged. No increase in right ventricular wall thickness. Right ventricular systolic function is hyperdynamic. Aortic Valve: Aortic valve gradients were not interrogated. No perivalvular leak. The aortic valve has been repaired/replaced. There is a 26 mm Ultra, stented (TAVR) valve present in the aortic position. Procedure Date: 03/2019. LEFT VENTRICLE PLAX 2D LVIDd:         4.10 cm LVIDs:         3.10 cm LV PW:         0.90 cm LV IVS:        0.90 cm  LEFT ATRIUM         Index LA diam:    4.30 cm 2.88 cm/m   AORTA Ao Root diam: 2.70 cm Vinie Maxcy MD Electronically signed by Vinie Maxcy MD Signature Date/Time: 02/05/2023/2:49:50 PM    Final    EEG adult Result Date: 02/05/2023 Shelton Arlin KIDD, MD     02/05/2023 10:25 AM Patient Name: Katherine [name] MRN: [id number] Epilepsy Attending: Arlin KIDD Shelton Referring Physician/Provider: Jonel  Lonni SQUIBB, MD Date: 02/05/2023 Duration: 22.57 mins Patient history: 78yo F with ams getting eeg to evaluate for seizure Level of alertness: Awake AEDs during EEG study: None Technical aspects: This EEG study was done with scalp electrodes positioned according to the 10-20 International system of electrode placement. Electrical activity was reviewed with band pass filter of 1-70Hz , sensitivity of 7 uV/mm, display speed of  47mm/sec with a 60Hz  notched filter applied as appropriate. EEG data were recorded continuously and digitally stored.  Video monitoring was available and reviewed as appropriate. Description: The posterior dominant rhythm consists of 8 Hz activity of moderate voltage (25-35 uV) seen predominantly in posterior head regions, symmetric and reactive to eye opening and eye closing. EEG showed continuous generalized 3 to 6 Hz theta-delta slowing. Hyperventilation and photic stimulation were not performed.   ABNORMALITY - Continuous slow, generalized IMPRESSION: This study is suggestive of moderate diffuse encephalopathy. No seizures or epileptiform discharges were seen throughout the recording. Arlin MALVA Krebs   MR BRAIN WO CONTRAST Result Date: 02/05/2023 CLINICAL DATA:  Altered mental status. EXAM: MRI HEAD WITHOUT CONTRAST TECHNIQUE: Multiplanar, multiecho pulse sequences of the brain and surrounding structures were obtained without intravenous contrast. COMPARISON:  Head CT 02/04/2023 and MRI 04/30/2013 FINDINGS: The study is mildly motion degraded. Brain: There is no evidence of an acute infarct, mass, midline shift, or extra-axial fluid collection. T2 hyperintensities in the cerebral white matter bilaterally have progressed from the prior MRI and are nonspecific but compatible with mild chronic small vessel ischemic disease. Chronic lacunar infarcts in the left lentiform nucleus and right cerebellar hemisphere are new from the prior MRI. There is mild generalized cerebral atrophy. Scattered chronic cerebral microhemorrhages are again seen bilaterally. Vascular: Major intracranial vascular flow voids are preserved. Skull and upper cervical spine: Unremarkable bone marrow signal. Sinuses/Orbits: Bilateral cataract extraction. Mild mucosal thickening in the paranasal sinuses. No significant mastoid fluid. Other: None. IMPRESSION: 1. No acute intracranial abnormality. 2. Mild chronic small vessel ischemic disease.  Electronically Signed   By: Dasie Hamburg M.D.   On: 02/05/2023 07:37   CT HEAD WO CONTRAST Result Date: 02/04/2023 CLINICAL DATA:  Altered mental status.  Found unresponsive. EXAM: CT HEAD WITHOUT CONTRAST TECHNIQUE: Contiguous axial images were obtained from the base of the skull through the vertex without intravenous contrast. RADIATION DOSE REDUCTION: This exam was performed according to the departmental dose-optimization program which includes automated exposure control, adjustment of the mA and/or kV according to patient size and/or use of iterative reconstruction technique. COMPARISON:  CT head 09/29/2022. FINDINGS: Brain: No evidence of acute large vascular territory infarction, hemorrhage, hydrocephalus, extra-axial collection or mass lesion/mass effect. Vascular: No hyperdense vessel. Skull: No acute fracture. Sinuses/Orbits: Clear sinuses. IMPRESSION: No evidence of acute intracranial abnormality. Electronically Signed   By: Gilmore GORMAN Molt M.D.   On: 02/04/2023 22:27   CT ABDOMEN PELVIS W CONTRAST Result Date: 02/04/2023 CLINICAL DATA:  Found unresponsive EXAM: CT ABDOMEN AND PELVIS WITH CONTRAST TECHNIQUE: Multidetector CT imaging of the abdomen and pelvis was performed using the standard protocol following bolus administration of intravenous contrast. RADIATION DOSE REDUCTION: This exam was performed according to the departmental dose-optimization program which includes automated exposure control, adjustment of the mA and/or kV according to patient size and/or use of iterative reconstruction technique. CONTRAST:  75mL OMNIPAQUE  IOHEXOL  350 MG/ML SOLN COMPARISON:  04/03/19 FINDINGS: Lower chest: No acute abnormality.  Changes of prior TAVR are noted. Hepatobiliary: No focal liver abnormality is seen. No gallstones, gallbladder wall thickening, or  biliary dilatation. Pancreas: Unremarkable. No pancreatic ductal dilatation or surrounding inflammatory changes. Spleen: Normal in size without focal  abnormality. Adrenals/Urinary Tract: Adrenal glands are within normal limits. Kidneys demonstrate a normal enhancement pattern bilaterally. No renal calculi or obstructive changes are noted. The bladder is well distended. Stomach/Bowel: Diverticular change of the colon is noted. No obstructive or inflammatory changes of colon are seen. The appendix is within normal limits. Small bowel and stomach are unremarkable. Vascular/Lymphatic: Aortic atherosclerosis. No enlarged abdominal or pelvic lymph nodes. Reproductive: Status post hysterectomy. No adnexal masses. Other: No abdominal wall hernia or abnormality. No abdominopelvic ascites. Musculoskeletal: Multiple old healed pelvic fractures are seen in the pubic rami bilaterally. Right sacral healed fracture is noted as well. 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 IMPRESSION: Diverticulosis without diverticulitis. No acute abnormality noted. Electronically Signed   By: Oneil Devonshire M.D.   On: 02/04/2023 22:21   DG Chest Port 1 View Result Date: 02/04/2023 CLINICAL DATA:  Low saturations and altered mental status EXAM: PORTABLE CHEST 1 VIEW COMPARISON:  09/29/2022 FINDINGS: Low lung volumes accentuate cardiomediastinal silhouette and pulmonary vascularity. TAVR. Coronary stenting. Cardiomegaly. Pulmonary vascular congestion. Similar scarring in the left mid lung. No focal consolidation, pleural effusion, or pneumothorax. Right reverse TSA. IMPRESSION: Cardiomegaly with pulmonary vascular congestion. Electronically Signed   By: Norman Gatlin M.D.   On: 02/04/2023 20:49   ECHOCARDIOGRAM COMPLETE Result Date: 01/18/2023    ECHOCARDIOGRAM REPORT   Patient Name:   Katherine [name] [facility] Date of Exam: 01/18/2023 Medical Rec #:  [id number]         Height:       59.0 in Accession #:    [id number]        Weight:       123.4 lb Date of Birth:  [birth year]          BSA:          1.502 m Patient Age:    77 years          BP:           124/76 mmHg Patient  Gender: F                 HR:           91 bpm. Exam Location:  [address] Procedure: 2D Echo, Cardiac Doppler and Color Doppler Indications:    I48.91 Atrial Fibrillation  History:        Patient has prior history of Echocardiogram examinations, most                 recent 04/21/2020. Arrythmias:Atrial Fibrillation,                 Signs/Symptoms:Chest Pain; Risk Factors:Family History of                 Coronary Artery Disease, Hypertension, Diabetes and                 Dyslipidemia. Severe Aortic Stenosis, Status post TAVR (04/22/19,                 88m mm Celestia BUE).  Sonographer:    Heather Hawks RDCS Referring Phys: GLENDIA DASEN WEAVER IMPRESSIONS  1. Left ventricular ejection fraction, by estimation, is 60 to 65%. The left ventricle has normal function. The left ventricle has no regional wall motion abnormalities. Left ventricular diastolic parameters are consistent with Grade I diastolic dysfunction (impaired relaxation).  2. Right ventricular systolic function  is normal. The right ventricular size is normal.  3. Left atrial size was mildly dilated.  4. The mitral valve is normal in structure. Mild mitral valve regurgitation. No evidence of mitral stenosis.  5. The aortic valve has been repaired/replaced. Aortic valve regurgitation is not visualized. Aortic valve sclerosis/calcification is present, without any evidence of aortic stenosis. Echo findings are consistent with normal structure and function of the aortic valve prosthesis. Aortic valve area, by VTI measures 1.54 cm. Aortic valve mean gradient measures 6.5 mmHg. Aortic valve Vmax measures 1.73 m/s.  6. The inferior vena cava is normal in size with greater than 50% respiratory variability, suggesting right atrial pressure of 3 mmHg. FINDINGS  Left Ventricle: Left ventricular ejection fraction, by estimation, is 60 to 65%. The left ventricle has normal function. The left ventricle has no regional wall motion abnormalities. The left ventricular  internal cavity size was normal in size. There is  no left ventricular hypertrophy. Left ventricular diastolic parameters are consistent with Grade I diastolic dysfunction (impaired relaxation). Right Ventricle: The right ventricular size is normal. No increase in right ventricular wall thickness. Right ventricular systolic function is normal. Left Atrium: Left atrial size was mildly dilated. Right Atrium: Right atrial size was normal in size. Pericardium: There is no evidence of pericardial effusion. Mitral Valve: The mitral valve is normal in structure. Mild mitral valve regurgitation. No evidence of mitral valve stenosis. Tricuspid Valve: The tricuspid valve is normal in structure. Tricuspid valve regurgitation is mild . No evidence of tricuspid stenosis. Aortic Valve: The aortic valve has been repaired/replaced. Aortic valve regurgitation is not visualized. Aortic valve sclerosis/calcification is present, without any evidence of aortic stenosis. Aortic valve mean gradient measures 6.5 mmHg. Aortic valve peak gradient measures 12.0 mmHg. Aortic valve area, by VTI measures 1.54 cm. There is a 26 mm Sapien prosthetic, stented (TAVR) valve present in the aortic position. Echo findings are consistent with normal structure and function of the aortic valve prosthesis. Pulmonic Valve: The pulmonic valve was normal in structure. Pulmonic valve regurgitation is trivial. No evidence of pulmonic stenosis. Aorta: The aortic root is normal in size and structure. Venous: The inferior vena cava is normal in size with greater than 50% respiratory variability, suggesting right atrial pressure of 3 mmHg. IAS/Shunts: No atrial level shunt detected by color flow Doppler.  LEFT VENTRICLE PLAX 2D LVIDd:         3.70 cm   Diastology LVIDs:         2.40 cm   LV e' medial:    6.31 cm/s LV PW:         0.90 cm   LV E/e' medial:  13.4 LV IVS:        0.90 cm   LV e' lateral:   10.40 cm/s LVOT diam:     1.95 cm   LV E/e' lateral: 8.1 LV SV:          50 LV SV Index:   33 LVOT Area:     2.99 cm  RIGHT VENTRICLE RV Basal diam:  3.80 cm RV S prime:     21.80 cm/s RVSP:           36.2 mmHg LEFT ATRIUM             Index        RIGHT ATRIUM           Index LA diam:        4.10 cm 2.73 cm/m   RA  Pressure: 3.00 mmHg LA Vol (A2C):   47.9 ml 31.89 ml/m  RA Area:     20.20 cm LA Vol (A4C):   51.7 ml 34.42 ml/m  RA Volume:   63.80 ml  42.47 ml/m LA Biplane Vol: 52.0 ml 34.62 ml/m  AORTIC VALVE AV Area (Vmax):    1.63 cm AV Area (Vmean):   1.62 cm AV Area (VTI):     1.54 cm AV Vmax:           173.00 cm/s AV Vmean:          114.000 cm/s AV VTI:            0.327 m AV Peak Grad:      12.0 mmHg AV Mean Grad:      6.5 mmHg LVOT Vmax:         94.30 cm/s LVOT Vmean:        61.850 cm/s LVOT VTI:          0.168 m LVOT/AV VTI ratio: 0.52  AORTA Ao Root diam: 2.80 cm Ao Asc diam:  3.20 cm MITRAL VALVE                TRICUSPID VALVE MV Area (PHT)  cm          TR Peak grad:   33.2 mmHg MV Decel Time: 191 msec     TR Vmax:        288.00 cm/s MV E velocity: 84.38 cm/s   Estimated RAP:  3.00 mmHg MV A velocity: 102.18 cm/s  RVSP:           36.2 mmHg MV E/A ratio:  0.83                             SHUNTS                             Systemic VTI:  0.17 m                             Systemic Diam: 1.95 cm Toribio Fuel MD Electronically signed by Toribio Fuel MD Signature Date/Time: 01/18/2023/4:22:07 PM    Final     Microbiology: Results for orders placed or performed during the hospital encounter of 02/04/23  Resp panel by RT-PCR (RSV, Flu A&B, Covid) Anterior Nasal Swab     Status: None   Collection Time: 02/04/23  8:30 PM   Specimen: Anterior Nasal Swab  Result Value Ref Range Status   SARS Coronavirus 2 by RT PCR NEGATIVE NEGATIVE Final   Influenza A by PCR NEGATIVE NEGATIVE Final   Influenza B by PCR NEGATIVE NEGATIVE Final    Comment: (NOTE) The Xpert Xpress SARS-CoV-2/FLU/RSV plus assay is intended as an aid in the diagnosis of influenza from  Nasopharyngeal swab specimens and should not be used as a sole basis for treatment. Nasal washings and aspirates are unacceptable for Xpert Xpress SARS-CoV-2/FLU/RSV testing.  Fact Sheet for Patients: bloggercourse.com  Fact Sheet for Healthcare Providers: seriousbroker.it  This test is not yet approved or cleared by the [place]  FDA and has been authorized for detection and/or diagnosis of SARS-CoV-2 by FDA under an Emergency Use Authorization (EUA). This EUA will remain in effect (meaning this test can be used) for the duration of the COVID-19 declaration under Section 564(b)(1) of the Act, 21 U.S.C.  section 360bbb-3(b)(1), unless the authorization is terminated or revoked.     Resp Syncytial Virus by PCR NEGATIVE NEGATIVE Final    Comment: (NOTE) Fact Sheet for Patients: bloggercourse.com  Fact Sheet for Healthcare Providers: seriousbroker.it  This test is not yet approved or cleared by the [place]  FDA and has been authorized for detection and/or diagnosis of SARS-CoV-2 by FDA under an Emergency Use Authorization (EUA). This EUA will remain in effect (meaning this test can be used) for the duration of the COVID-19 declaration under Section 564(b)(1) of the Act, 21 U.S.C. section 360bbb-3(b)(1), unless the authorization is terminated or revoked.  Performed at [facility], [address], [place], [place] [address]   Respiratory (~20 pathogens) panel by PCR     Status: Abnormal   Collection Time: 02/05/23 10:38 AM   Specimen: Nasopharyngeal Swab; Respiratory  Result Value Ref Range Status   Adenovirus NOT DETECTED NOT DETECTED Final   Coronavirus 229E NOT DETECTED NOT DETECTED Final    Comment: (NOTE) The Coronavirus on the Respiratory Panel, DOES NOT test for the novel  Coronavirus (2019 nCoV)    Coronavirus HKU1 NOT DETECTED NOT DETECTED Final    Coronavirus NL63 NOT DETECTED NOT DETECTED Final   Coronavirus OC43 DETECTED (A) NOT DETECTED Final   Metapneumovirus NOT DETECTED NOT DETECTED Final   Rhinovirus / Enterovirus NOT DETECTED NOT DETECTED Final   Influenza A NOT DETECTED NOT DETECTED Final   Influenza B NOT DETECTED NOT DETECTED Final   Parainfluenza Virus 1 NOT DETECTED NOT DETECTED Final   Parainfluenza Virus 2 NOT DETECTED NOT DETECTED Final   Parainfluenza Virus 3 NOT DETECTED NOT DETECTED Final   Parainfluenza Virus 4 NOT DETECTED NOT DETECTED Final   Respiratory Syncytial Virus NOT DETECTED NOT DETECTED Final   Bordetella pertussis NOT DETECTED NOT DETECTED Final   Bordetella Parapertussis NOT DETECTED NOT DETECTED Final   Chlamydophila pneumoniae NOT DETECTED NOT DETECTED Final   Mycoplasma pneumoniae NOT DETECTED NOT DETECTED Final    Comment: Performed at [facility], [address] [place], [place] [address]    Labs: CBC: Recent Labs  Lab 02/04/23 2000 02/05/23 0538  WBC 8.9 6.4  NEUTROABS 6.3  --   HGB 11.7* 10.6*  HCT 36.1 33.6*  MCV 95.0 98.0  PLT 282 270   Basic Metabolic Panel: Recent Labs  Lab 02/04/23 2000 02/05/23 0500  NA 138 137  K 4.3 3.8  CL 100 100  CO2 25 25  GLUCOSE 93 84  BUN 13 13  CREATININE 0.54 0.61  CALCIUM  9.1 8.6*   Liver Function Tests: Recent Labs  Lab 02/04/23 2000  AST 23  ALT 13  ALKPHOS 47  BILITOT 0.8  PROT 6.1*  ALBUMIN 3.2*   CBG: Recent Labs  Lab 02/04/23 2011  GLUCAP 99    Discharge time spent: approximately 45 minutes spent on discharge counseling, evaluation of patient on day of discharge, and coordination of discharge planning with nursing, social work, pharmacy and case management  Signed: Lonni SHAUNNA Dalton, MD [facility] 02/05/2023

## 2023-02-05 NOTE — Evaluation (Signed)
 Physical Therapy Evaluation Patient Details Name: Katherine [name] MRN: [id number] DOB: 20-Apr-1945 Today's Date: 02/05/2023  History of Present Illness  Pt is a 78 yo female presenting to the ED on 02/04/23 for the evaluation of AMS. Pt endorses a fall along with chest pains. CT and xray showing pulmonary vascular congestion and diverticulosis, awaiting MRI imaging. PMH significant of  CAD, history of paroxysmal atrial fibrillation-resolved not on Eliquis  since 01/17/2023, severe aortic stenosis status post TAVR March 2021, essential hypertension, non-insulin -dependent DM type II, neuropathy, and chronic tremor.   Clinical Impression  [name] is 78 y.o. female admitted with above HPI and diagnosis. Patient is currently limited by functional impairments below (see PT problem list). Patient unable to provide PLOF and current living arrangements reliably however at EOS does report from [facility]. Currently pt requires min-mod assist for bed mobility and Min assist for sit<>stand with RW from elevated EOB. On attempt for bed>chair transfer pt lifting Rt LE and unable to place foot back in safe position to step, pt returned to sit EOB and then supine. Patient will benefit from continued skilled PT interventions to address impairments and progress independence with mobility. Patient will benefit from continued inpatient follow up therapy, <3 hours/day. Acute PT will follow and progress as able.         If plan is discharge home, recommend the following: A lot of help with walking and/or transfers;A lot of help with bathing/dressing/bathroom;Assistance with cooking/housework;Direct supervision/assist for medications management;Assist for transportation;Help with stairs or ramp for entrance;Supervision due to cognitive status   Can travel by private vehicle   No    Equipment Recommendations None recommended by PT  Recommendations for Other Services       Functional Status Assessment  Patient has had a recent decline in their functional status and demonstrates the ability to make significant improvements in function in a reasonable and predictable amount of time.     Precautions / Restrictions Precautions Precautions: Fall Restrictions Weight Bearing Restrictions Per Provider Order: No      Mobility  Bed Mobility Overal bed mobility: Needs Assistance Bed Mobility: Rolling, Supine to Sit, Sit to Supine Rolling: Min assist, Mod assist         General bed mobility comments: Min assist to roll Lt with pt reaching Rt UE to bed rail to assist, Mod assist to roll Lt with decreased initiation to reach for rolling. Mod assist to raise trunk and pivot hips to sit EOB. Pt with slight flexed trunk and Lt lean but able to maintain balance with CGA. Mod assist to return to supine and reposition in bed.    Transfers Overall transfer level: Needs assistance Equipment used: Rolling walker (2 wheels) Transfers: Sit to/from Stand Sit to Stand: Min assist, From elevated surface           General transfer comment: MIn assist with RW to rise from elevated stretcher height. pt unable to take lateral steps safely for bed>chair transfer as pt lifting Rt foot and despite multimodal cues unable to maintain safe foot position for stepping. Returned to sit EOB.    Ambulation/Gait                  Stairs            Wheelchair Mobility     Tilt Bed    Modified Rankin (Stroke Patients Only)       Balance Overall balance assessment: Needs assistance Sitting-balance support: Feet supported  Standing balance support: Bilateral upper extremity supported, During functional activity, Reliant on assistive device for balance                                 Pertinent Vitals/Pain Pain Assessment Pain Assessment: PAINAD Breathing: normal Negative Vocalization: none Facial Expression: smiling or inexpressive Body Language:  relaxed Consolability: no need to console PAINAD Score: 0 Pain Intervention(s): Monitored during session, Repositioned    Home Living Family/patient expects to be discharged to:: Unsure                   Additional Comments: pt unreliable historian. eventually reporting she was at [facility].    Prior Function Prior Level of Function : Patient poor historian/Family not available             Mobility Comments: pt reports she gets up to [facility], unsure of baseline if ambulator with RW or WC user. pt unable to provide reliable history/PLOF       Extremity/Trunk Assessment   Upper Extremity Assessment Upper Extremity Assessment: Defer to OT evaluation;Generalized weakness    Lower Extremity Assessment Lower Extremity Assessment: Generalized weakness    Cervical / Trunk Assessment Cervical / Trunk Assessment: Kyphotic  Communication   Communication Communication: Difficulty following commands/understanding;Difficulty communicating thoughts/reduced clarity of speech Following commands: Follows one step commands inconsistently;Follows one step commands with increased time Cueing Techniques: Verbal cues;Tactile cues  Cognition Arousal: Alert Behavior During Therapy: WFL for tasks assessed/performed Overall Cognitive Status: Impaired/Different from baseline Area of Impairment: Orientation, Attention, Memory, Following commands, Awareness, Problem solving                 Orientation Level: Disoriented to, Time, Situation, Place Current Attention Level: Focused Memory: Decreased recall of precautions, Decreased short-term memory Following Commands: Follows one step commands inconsistently, Follows one step commands with increased time   Awareness: Intellectual Problem Solving: Slow processing, Decreased initiation, Requires verbal cues, Difficulty sequencing          General Comments      Exercises     Assessment/Plan    PT Assessment    PT Problem List          PT Treatment Interventions      PT Goals (Current goals can be found in the Care Plan section)  Acute Rehab PT Goals Patient Stated Goal: none stated PT Goal Formulation: Patient unable to participate in goal setting Time For Goal Achievement: 02/19/23 Potential to Achieve Goals: Good    Frequency       Co-evaluation               AM-PAC PT 6 Clicks Mobility  Outcome Measure Help needed turning from your back to your side while in a flat bed without using bedrails?: A Little Help needed moving from lying on your back to sitting on the side of a flat bed without using bedrails?: A Lot Help needed moving to and from a bed to a chair (including a wheelchair)?: A Little Help needed standing up from a chair using your arms (e.g., wheelchair or bedside chair)?: A Lot Help needed to walk in hospital room?: Total Help needed climbing 3-5 steps with a railing? : Total 6 Click Score: 12    End of Session Equipment Utilized During Treatment: Gait belt Activity Tolerance: Patient tolerated treatment well Patient left: in bed;with call bell/phone within reach Nurse Communication: Mobility status  Time: 8947-8876 PT Time Calculation (min) (ACUTE ONLY): 31 min   Charges:   PT Evaluation $PT Eval Moderate Complexity: 1 Mod PT Treatments $Therapeutic Activity: 8-22 mins PT General Charges $$ ACUTE PT VISIT: 1 Visit         Vernell DONEEN KLEIN, DPT Acute Rehabilitation Services Office [phone]  02/05/23 2:47 PM

## 2023-02-05 NOTE — Progress Notes (Signed)
 EEG complete - results pending

## 2023-02-05 NOTE — ED Notes (Signed)
 Patient transported to vascular.

## 2023-02-05 NOTE — ED Notes (Signed)
 Brief soiled with urine; peri care and brief change. Pillow support placed under left hip.

## 2023-02-05 NOTE — Hospital Course (Signed)
 78 y.o. F with AS s/p TAVR 2021, HTN, DM, CAD no PCI, and brief pAF not on AC due to low AF burden who presented with AMS.

## 2023-02-05 NOTE — H&P (Addendum)
 History and Physical    Katherine [name] FMW:[id number] DOB: 1945/03/20 DOA: 02/04/2023  PCP: Elvera Ozell SAUNDERS, PA-C   Patient coming from: ALF   Chief Complaint:  Chief Complaint  Patient presents with   Altered Mental Status   ED TRIAGE note:Pt BIB [facility] from [facility] d/t low sats and AMS. Per EMS she is normally communicative.   HPI:  Katherine [name] is a 78 y.o. female with medical history significant of  CAD on medical management, history of paroxysmal atrial fibrillation-resolved not on Eliquis  since 01/17/2023, severe aortic stenosis status post TAVR March 2021, essential hypertension, non-insulin -dependent DM type II, neuropathy, and chronic tremor presented to emergency department for evaluation for altered mental status.  Patient endorses fall chest pain along with urinary discomfort.  Unable to give any further history.  According to ED triage patient found altered having low oxygen saturation.  ED physician later spoke with patient's family over phone stated that patient usually communicate and able to carry conversation at the baseline.  However at this time patient is currently oriented to self only.  Per chart review of the cardiology note from 12/13/2022 patient found to have new onset of atrial fibrillation at the nursing facility and patient has been placed on a event monitor and empirically started on apixaban .  Per cardiology note if event monitor confirm atrial fibrillation in that case patient would be candidate for Watchman device.  Event monitor has been reviewed on 01/17/2023.  Event monitor showed sinus rhythm heart rate average 79 bpm.  There is no significant or sustained arrhythmia found specifically no atrial fibrillation or flutter.  Cardiology recommended to stop the apixaban  and continue the metoprolol .     ED Course:  At presentation to ED patient is hemodynamically stable.  O2 sat 97% room air. CMP unremarkable except low albumin 3.2.  CBC unremarkable  stable H&H 11.7 and 36.  Normal lipase.  Troponin within normal range.  Blood glucose 99.  UA unremarkable.  Respiratory panel negative.  UDS negative.  Lactic acid within normal range. EKG shows sinus arrhythmia heart rate 89.  Chest x-ray showed cardiomegaly with pulmonary vascular congestion. CT head no acute intracranial abnormality. CT abdomen pelvis showed diverticulosis without diverticulitis.  ED physician reported that patient is alert but not completely oriented which is not patient's baseline.  Family reported she has been like this state of mental health for last 24 hours which is a sudden change.  Patient did not have any focal neurological weakness and no history of seizure.  Hospice has been contacted for further management and evaluation of altered mental status.  Significant labs in the ED: Lab Orders         Resp panel by RT-PCR (RSV, Flu A&B, Covid) Anterior Nasal Swab         Comprehensive metabolic panel         CBC with Differential/Platelet         Urinalysis, Routine w reflex microscopic -Urine, Clean Catch         Lipase, blood         Rapid urine drug screen (hospital performed)         Ammonia         TSH         CBC         Comprehensive metabolic panel         Vitamin B12         POC CBG, ED  CBG monitoring, ED       Review of Systems:  Review of Systems  Constitutional:  Negative for chills, fever and malaise/fatigue.  Eyes:  Negative for blurred vision.  Respiratory:  Negative for cough, sputum production and shortness of breath.   Cardiovascular:  Positive for chest pain. Negative for palpitations and leg swelling.  Gastrointestinal:  Negative for abdominal pain, diarrhea, heartburn, nausea and vomiting.  Genitourinary:  Negative for dysuria, flank pain, frequency and urgency.  Musculoskeletal:  Negative for back pain, joint pain and neck pain.  Neurological:  Positive for headaches. Negative for dizziness, focal weakness and weakness.   Psychiatric/Behavioral:  The patient is not nervous/anxious.     Past Medical History:  Diagnosis Date   ANXIETY 07/31/2007   Atrial fibrillation (HCC)    a. per structural notes, previous hx remote Afib.   CAD (coronary artery disease)    a. 03/2019 orbital atherectomy/PCI with DES to RCA, prox Cx, and mid Cx.   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   s/p TAVR with a 26 mm Edwards S3U via the TF approach by Drs Wonda and Dusty   Severe aortic stenosis    s/p TAVR   Unspecified hearing loss 10/09/2007    Past Surgical History:  Procedure Laterality Date   ABDOMINAL HYSTERECTOMY  12/08   Bladder tac, partial hysterectomy   BREAST BIOPSY     CARDIAC CATHETERIZATION     catarct removal     both eyes   COLONOSCOPY     CORONARY ATHERECTOMY N/A 04/15/2019   Procedure: CORONARY ATHERECTOMY;  Surgeon: Wonda Sharper, MD;  Location: [facility] INVASIVE CV LAB;  Service: Cardiovascular;  Laterality: N/A;   CORONARY STENT INTERVENTION N/A 04/15/2019   Procedure: CORONARY STENT INTERVENTION;  Surgeon: Wonda Sharper, MD;  Location: [facility] INVASIVE CV LAB;  Service: Cardiovascular;  Laterality: N/A;   I & D KNEE WITH POLY EXCHANGE Left 01/03/2013   Procedure: IRRIGATION AND DEBRIDEMENT LEFT KNEE WITH POLY EXCHANGE;  Surgeon: Lonni CINDERELLA Poli, MD;  Location: [facility] ORS;  Service: Orthopedics;  Laterality: Left;   inguinal herniorrhapy     right Dr. Dasie 06/2009   REVERSE SHOULDER ARTHROPLASTY Right 09/07/2016   REVERSE SHOULDER ARTHROPLASTY Right 09/07/2016   Procedure: REVERSE SHOULDER ARTHROPLASTY;  Surgeon: Dozier Soulier, MD;  Location[facility];  Service: Orthopedics;  Laterality: Right;   Right reverse shoulder arthroplasty   RIGHT/LEFT HEART CATH AND CORONARY ANGIOGRAPHY N/A 09/11/2018   Procedure: RIGHT/LEFT HEART CATH AND CORONARY ANGIOGRAPHY;  Surgeon: Wonda Sharper, MD;  Location: [facility] INVASIVE CV LAB;  Service: Cardiovascular;  Laterality: N/A;   TEE WITHOUT CARDIOVERSION N/A 04/22/2019   Procedure: TRANSESOPHAGEAL ECHOCARDIOGRAM (TEE);  Surgeon: Wonda Sharper, MD;  Location: [facility] INVASIVE CV LAB;  Service: Open Heart Surgery;  Laterality: N/A;   TONSILLECTOMY     TOTAL KNEE ARTHROPLASTY Left 12/20/2012   Procedure: LEFT TOTAL KNEE ARTHROPLASTY;  Surgeon: Lonni CINDERELLA Poli, MD;  Location: [facility] ORS;  Service: Orthopedics;  Laterality: Left;   TRANSCATHETER AORTIC VALVE REPLACEMENT, TRANSFEMORAL N/A 04/22/2019   Procedure: TRANSCATHETER AORTIC VALVE REPLACEMENT, TRANSFEMORAL;  Surgeon: Wonda Sharper, MD;  Location: [facility] INVASIVE CV LAB;  Service: Open Heart Surgery;  Laterality: N/A;  reports that she has never smoked. She has never used smokeless tobacco. She reports that she does not drink alcohol and does not use drugs.  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 PATIENT HAS HAD A PCN REACTION WITH IMMEDIATE RASH, FACIAL/TONGUE/THROAT SWELLING, SOB, OR LIGHTHEADEDNESS WITH HYPOTENSION:  #  #  #  YES  #  #  #   Has patient had a PCN reaction causing severe rash involving mucus membranes or skin necrosis: Unknown Has patient had a PCN reaction that required hospitalization: Unknown Has patient had a PCN reaction occurring within the last 10 years: Unknown    Euphorbia Other (See Comments)    Not listed on MAR   Pollen Extract Other (See Comments)    UNSPECIFIED REACTION, but allergic    Coconut (Cocos Nucifera) Itching   Coconut Fatty Acid Itching   Latex Rash   Mellaril [Thioridazine] Itching    Family History  Problem Relation Age of Onset   Coronary artery disease Other    Diabetes Other     Heart failure Mother    Heart failure Father    Colon cancer Neg Hx    Esophageal cancer Neg Hx    Stomach cancer Neg Hx    Rectal cancer Neg Hx     Prior to Admission medications   Medication Sig Start Date End Date Taking? Authorizing Provider  acetaminophen  (TYLENOL ) 500 MG tablet Take 500 mg by mouth every 6 (six) hours as needed for mild pain or headache.   Yes [provider]  acetaminophen -codeine (TYLENOL  #3) 300-30 MG tablet Take 1 tablet by mouth every 6 (six) hours as needed (tooth pain).   Yes [provider]  alendronate  (FOSAMAX ) 70 MG tablet Take 1 tablet (70 mg total) by mouth every Monday. 06/06/21  Yes Medina-Vargas, Monina C, NP  apixaban  (ELIQUIS ) 5 MG TABS tablet Take 5 mg by mouth 2 (two) times daily.   Yes [provider]  ascorbic acid  (VITAMIN C) 500 MG tablet Take 500 mg by mouth daily.   Yes [provider]  aspirin  EC 81 MG tablet Take 1 tablet (81 mg total) by mouth every other day. 06/03/21  Yes Medina-Vargas, Monina C, NP  benztropine  (COGENTIN ) 0.5 MG tablet Take 1 tablet (0.5 mg total) by mouth in the morning and at bedtime. 06/03/21  Yes Medina-Vargas, Monina C, NP  bisacodyl  (DULCOLAX) 10 MG suppository Place 10 mg rectally daily as needed for moderate constipation.   Yes [provider]  calcium -vitamin D  (OSCAL WITH D) 500-200 MG-UNIT tablet Take 1 tablet by mouth 3 (three) times daily. 02/25/15  Yes Norleen Lynwood ORN, MD  fluticasone  (FLONASE ) 50 MCG/ACT nasal spray Place 2 sprays into both nostrils daily. 06/03/21  Yes Medina-Vargas, Monina C, NP  gabapentin  (NEURONTIN ) 100 MG capsule Take 2 capsules (200 mg total) by mouth 2 (two) times daily. Take 200 mg by mouth in the morning and 100 mg at bedtime Patient taking differently: Take 200 mg by mouth 3 (three) times daily. 06/03/21  Yes Medina-Vargas, Monina C, NP  hydrochlorothiazide  (MICROZIDE ) 12.5 MG capsule Take 1 capsule (12.5 mg total) by mouth in the morning. 06/03/21   Yes Medina-Vargas, Monina C, NP  ipratropium (ATROVENT ) 0.06 % nasal spray USE 1 SPRAY IN EACH NOSTRIL THREE TIMES DAILY 06/03/21  Yes Medina-Vargas, Monina C, NP  magnesium  oxide (MAG-OX) 400 (240 Mg) MG tablet Take 400 mg by mouth daily.  Yes [provider]  metFORMIN  (GLUCOPHAGE ) 500 MG tablet Take 1 tablet (500 mg total) by mouth in the morning and at bedtime. 06/03/21  Yes Medina-Vargas, Monina C, NP  metoprolol  succinate (TOPROL  XL) 25 MG 24 hr tablet Take 1/2 tablet (12.5 mg total) by mouth daily. 12/13/22  Yes Weaver, Scott T, PA-C  Multiple Vitamin (MULTIVITAMIN WITH MINERALS) TABS tablet Take 1 tablet by mouth daily with breakfast.   Yes [provider]  NAMZARIC  28-10 MG CP24 Take 1 capsule by mouth in the morning. 06/03/21  Yes Medina-Vargas, Monina C, NP  nitroGLYCERIN  (NITROSTAT ) 0.4 MG SL tablet DISSOLVE 1 TABLET UNDER THE TONGUE EVERY 5 MINUTES AS NEEDED FOR CHEST PAIN AS DIRECTED 06/03/21  Yes Medina-Vargas, Monina C, NP  Nutritional Supplements (NUTRITIONAL DRINK) LIQD Take 1 Container by mouth daily. [employer]   Yes [provider]  Omega-3 1000 MG CAPS Take 1,000 mg by mouth every Monday, Wednesday, and Friday.   Yes [provider]  pantoprazole  (PROTONIX ) 40 MG tablet Take 1 tablet (40 mg total) by mouth daily. 06/03/21  Yes Medina-Vargas, Monina C, NP  Potassium Chloride  ER 20 MEQ TBCR Take 20 mEq by mouth in the morning and at bedtime. 06/03/21  Yes Medina-Vargas, Monina C, NP  risperiDONE  (RISPERDAL ) 2 MG tablet Take 2 mg by mouth at bedtime. 09/22/22  Yes [provider]  rosuvastatin  (CRESTOR ) 40 MG tablet Take 1 tablet orally daily at 6 PM. 06/03/21  Yes Medina-Vargas, Monina C, NP  Zinc  Oxide 10 % AERO Apply 1 application  topically 2 (two) times daily. Incontinent care.   Yes [provider]  JESSE SCHLOSSMAN Diet:Heart Healthy/CCD/thin liquids    [provider]     Physical Exam: Vitals:   02/04/23 2130 02/04/23 2300  02/05/23 0200 02/05/23 0330  BP: 105/74 106/60 112/63   Pulse: 65 63 61   Resp: 14 12 13    Temp:  98.3 F (36.8 C)  98.2 F (36.8 C)  TempSrc:  Axillary  Oral  SpO2: 98% 95% 97%   Weight:      Height:        Physical Exam Constitutional:      Comments: Patient is alert and oriented to name only.   HENT:     Head: Normocephalic.     Mouth/Throat:     Mouth: Mucous membranes are dry.  Eyes:     Conjunctiva/sclera: Conjunctivae normal.  Cardiovascular:     Rate and Rhythm: Normal rate and regular rhythm.     Pulses: Normal pulses.     Heart sounds: Normal heart sounds.  Abdominal:     General: Bowel sounds are normal.  Musculoskeletal:        General: No swelling.     Cervical back: Neck supple.     Right lower leg: No edema.     Left lower leg: No edema.  Skin:    General: Skin is dry.     Capillary Refill: Capillary refill takes less than 2 seconds.  Neurological:     Mental Status: She is alert.     Cranial Nerves: No cranial nerve deficit.     Sensory: No sensory deficit.     Comments: Alert and oriented to self only  Psychiatric:     Comments: Unable to assess      Labs on Admission: I have personally reviewed following labs and imaging studies  CBC: Recent Labs  Lab 02/04/23 2000  WBC 8.9  NEUTROABS 6.3  HGB 11.7*  HCT 36.1  MCV 95.0  PLT 282   Basic Metabolic Panel: Recent Labs  Lab 02/04/23 2000  NA 138  K 4.3  CL 100  CO2 25  GLUCOSE 93  BUN 13  CREATININE 0.54  CALCIUM  9.1   GFR: Estimated Creatinine Clearance: 44.5 mL/min (by C-G formula based on SCr of 0.54 mg/dL). Liver Function Tests: Recent Labs  Lab 02/04/23 2000  AST 23  ALT 13  ALKPHOS 47  BILITOT 0.8  PROT 6.1*  ALBUMIN 3.2*   Recent Labs  Lab 02/04/23 2000  LIPASE 21   No results for input(s): AMMONIA in the last 168 hours. Coagulation Profile: No results for input(s): INR, PROTIME in the last 168 hours. Cardiac Enzymes: Recent Labs  Lab  02/04/23 2000 02/04/23 2225  TROPONINIHS 6 8   BNP (last 3 results) No results for input(s): BNP in the last 8760 hours. HbA1C: No results for input(s): HGBA1C in the last 72 hours. CBG: Recent Labs  Lab 02/04/23 2011  GLUCAP 99   Lipid Profile: No results for input(s): CHOL, HDL, LDLCALC, TRIG, CHOLHDL, LDLDIRECT in the last 72 hours. Thyroid  Function Tests: No results for input(s): TSH, T4TOTAL, FREET4, T3FREE, THYROIDAB in the last 72 hours. Anemia Panel: No results for input(s): VITAMINB12, FOLATE, FERRITIN, TIBC, IRON, RETICCTPCT in the last 72 hours. Urine analysis:    Component Value Date/Time   COLORURINE YELLOW 02/04/2023 2030   APPEARANCEUR CLEAR 02/04/2023 2030   LABSPEC 1.017 02/04/2023 2030   PHURINE 7.0 02/04/2023 2030   GLUCOSEU NEGATIVE 02/04/2023 2030   GLUCOSEU NEGATIVE 06/15/2015 1349   HGBUR NEGATIVE 02/04/2023 2030   BILIRUBINUR NEGATIVE 02/04/2023 2030   KETONESUR NEGATIVE 02/04/2023 2030   PROTEINUR NEGATIVE 02/04/2023 2030   UROBILINOGEN 0.2 06/15/2015 1349   NITRITE NEGATIVE 02/04/2023 2030   LEUKOCYTESUR NEGATIVE 02/04/2023 2030    Radiological Exams on Admission: I have personally reviewed images CT HEAD WO CONTRAST Result Date: 02/04/2023 CLINICAL DATA:  Altered mental status.  Found unresponsive. EXAM: CT HEAD WITHOUT CONTRAST TECHNIQUE: Contiguous axial images were obtained from the base of the skull through the vertex without intravenous contrast. RADIATION DOSE REDUCTION: This exam was performed according to the departmental dose-optimization program which includes automated exposure control, adjustment of the mA and/or kV according to patient size and/or use of iterative reconstruction technique. COMPARISON:  CT head 09/29/2022. FINDINGS: Brain: No evidence of acute large vascular territory infarction, hemorrhage, hydrocephalus, extra-axial collection or mass lesion/mass effect. Vascular: No hyperdense  vessel. Skull: No acute fracture. Sinuses/Orbits: Clear sinuses. IMPRESSION: No evidence of acute intracranial abnormality. Electronically Signed   By: Gilmore GORMAN Molt M.D.   On: 02/04/2023 22:27   CT ABDOMEN PELVIS W CONTRAST Result Date: 02/04/2023 CLINICAL DATA:  Found unresponsive EXAM: CT ABDOMEN AND PELVIS WITH CONTRAST TECHNIQUE: Multidetector CT imaging of the abdomen and pelvis was performed using the standard protocol following bolus administration of intravenous contrast. RADIATION DOSE REDUCTION: This exam was performed according to the departmental dose-optimization program which includes automated exposure control, adjustment of the mA and/or kV according to patient size and/or use of iterative reconstruction technique. CONTRAST:  75mL OMNIPAQUE  IOHEXOL  350 MG/ML SOLN COMPARISON:  04/03/19 FINDINGS: Lower chest: No acute abnormality.  Changes of prior TAVR are noted. Hepatobiliary: No focal liver abnormality is seen. No gallstones, gallbladder wall thickening, or biliary dilatation. Pancreas: Unremarkable. No pancreatic ductal dilatation or surrounding inflammatory changes. Spleen: Normal in size without focal abnormality. Adrenals/Urinary Tract: Adrenal glands are within normal limits. Kidneys demonstrate a normal  enhancement pattern bilaterally. No renal calculi or obstructive changes are noted. The bladder is well distended. Stomach/Bowel: Diverticular change of the colon is noted. No obstructive or inflammatory changes of colon are seen. The appendix is within normal limits. Small bowel and stomach are unremarkable. Vascular/Lymphatic: Aortic atherosclerosis. No enlarged abdominal or pelvic lymph nodes. Reproductive: Status post hysterectomy. No adnexal masses. Other: No abdominal wall hernia or abnormality. No abdominopelvic ascites. Musculoskeletal: Multiple old healed pelvic fractures are seen in the pubic rami bilaterally. Right sacral healed fracture is noted as well. 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 IMPRESSION: Diverticulosis without diverticulitis. No acute abnormality noted. Electronically Signed   By: Oneil Devonshire M.D.   On: 02/04/2023 22:21   DG Chest Port 1 View Result Date: 02/04/2023 CLINICAL DATA:  Low saturations and altered mental status EXAM: PORTABLE CHEST 1 VIEW COMPARISON:  09/29/2022 FINDINGS: Low lung volumes accentuate cardiomediastinal silhouette and pulmonary vascularity. TAVR. Coronary stenting. Cardiomegaly. Pulmonary vascular congestion. Similar scarring in the left mid lung. No focal consolidation, pleural effusion, or pneumothorax. Right reverse TSA. IMPRESSION: Cardiomegaly with pulmonary vascular congestion. Electronically Signed   By: Norman Gatlin M.D.   On: 02/04/2023 20:49     EKG: My personal interpretation of EKG shows: Sinus arrhythmia heart rate 49.    Assessment/Plan: Principal Problem:   Altered mental status, unspecified Active Problems:   History of present aortic stenosis status post TAVR March 2021   Chronic diastolic CHF (congestive heart failure) (HCC)   Essential hypertension   Peripheral neuropathy   History of dementia   Altered mental status    Assessment and Plan: Altered mental status-unspecified > Patient presenting from nursing home via EMS as family reported that patient patient is more confused.  EMS reported to triage that found hypoxic however since patient in the ED O2 sat 99 to 100% room air.  Family did not report any history of seizure.  At the bedside during my evaluation patient is alert and oriented to self, able to follow command.  Given patient has underlying dementia unable to recall any specific event.  Patient is pleasantly confused.  Patient is complaining about headache and chest pain. -All the workup in the ED included troponin, lipase, CBC, CMP, respiratory panel, and UA unremarkable..  Chest x-ray showed cardiomegaly with pulmonary vascular congestion.  CT head no evidence  of acute endocrine abnormality.  CT abdomen pelvis diverticulosis without diverticulitis. - Unclear etiology of altered mental status however given patient had history of atrial fibrillation and currently off Eliquis  checking MRI to rule out any ischemic.  Also checking TSH, ammonia and vitamin B12 level. It is also possible that patient has baseline dementia which has been progressively worsening and having fluctuating cognition. - If MRI unremarkable as well as other lab work include TSH, B12, ammonia and echocardiogram unremarkable ED safe to discharge patient back to nursing facility. -Continue neurocheck every 4 hours. - Consulting inpatient PT and OT for evaluation of balance. -Continue fall precaution, aspiration precaution and delirium precaution. --Continue cardiac monitoring for development of any tachycardia, bradycardia or arrhythmia.  History of dementia -Continue Cogentin  0.5 mg twice daily, memantine  28 mg daily, Aricept  10 mg daily and risperidone  2 mg at bedtime.  Chest pain - Patient was complaining about chest pain reported on the left side of the chest that radiated to the right side.  Unable to quantify and states how long she is having the chest pain.  Also patient is an unreliable historian  given history of dementia at the baseline normal troponin x 2 without any delta change and EKG showed sinus arrhythmia.  Unclear etiology of chest pain at this time however it is not cardiac origin given patient has normal troponin and EKG unremarkable for any ST anterior abnormality. -Obtaining echocardiogram to make sure there is no wall motion abnormality. -Continue to monitor.  History of paroxysmal atrial fibrillation-resolved-not on Eliquis  anymore Patient had history of paroxysmal atrial fibrillation had an event monitor which showed normal sinus rhythm with average heart rate 79 bpm with premature atrial contractions 27% and premature ventricular contraction 2% total beat.  There  were no significant sustained arrhythmia specifically atrial fibrillation/flutter.  Cardiology reevaluated on 01/17/2023 recommended to stop the Eliquis  and continue metoprolol .  (Please look for the recommendation under CV proc under cardiology telemetry monitor-office placed documentation/comment). -EKG showing rate controlled. - Patient has upcoming follow-up with cardiology on 02/27/2023.  New onset of atrial fibrillation patient had a event monitor.  -Plan to continue metoprolol  12.5 mg daily. -Continue metoprolol  XL 12.5 mg daily. -Need to discontinue Eliquis  from patient's home medication list during discharge.  Chronic diastolic heart failure with preserved EF 60 to 65% History of aortic stenosis status post TAVR Essential hypertension - Blood pressure is borderline soft.  Holding hydrochlorothiazide .   History of CAD - Continue aspirin , metoprolol  and Crestor   Peripheral neuropathy -Holding gabapentin  in the setting of worsening confusion.  Hyperlipidemia -Continue Crestor   Non-insulin -dependent DM type II -A1c 5.5.  Holding metformin  given A1c within normal range. - Continue heart healthy and carb modified diet.  DVT prophylaxis: Deferring pharmacological prophylaxis until MRI will be resulted.  Continue SCD and TED hose Code Status:  Full Code-by live fall.  Unable to verify CODE STATUS with patient at the bedside and family over phone. Diet: Heart healthy carb modified Family Communication: Unsuccessful attempt to reach patient's daughter over phone. Disposition Plan: Pending MRI of the brain and echocardiogram.  Tentative discharge to nursing home next 1 to 2 days Consults: PT and OT Admission status:   Observation, Telemetry bed  Severity of Illness: The appropriate patient status for this patient is OBSERVATION. Observation status is judged to be reasonable and necessary in order to provide the required intensity of service to ensure the patient's safety. The  patient's presenting symptoms, physical exam findings, and initial radiographic and laboratory data in the context of their medical condition is felt to place them at decreased risk for further clinical deterioration. Furthermore, it is anticipated that the patient will be medically stable for discharge from the hospital within 2 midnights of admission.     Xandra Laramee, MD [facility]  How to contact the [facility] Attending or Consulting provider 7A - 7P or covering provider during after hours 7P -7A, for this patient.  Check the care team in [facility] and look for a) attending/consulting [facility] provider listed and b) the TR[facility] team listed Log into [email or web address] and use [place]'s universal password to access. If you do not have the password, please contact the hospital operator. Locate the TR[facility] provider you are looking for under [facility] and page to a number that you can be directly reached. If you still have difficulty reaching the provider, please page the [facility] (Director on Call) for the Hospitalists listed on amion for assistance.  02/05/2023, 4:07 AM

## 2023-02-05 NOTE — ED Provider Notes (Signed)
  Physical Exam  BP 112/63   Pulse 61   Temp 98.3 F (36.8 C) (Axillary)   Resp 13   Ht 4' 11 (1.499 m)   Wt 55 kg   SpO2 97%   BMI 24.49 kg/m   Physical Exam  Procedures  Procedures  ED Course / MDM   Clinical Course as of 02/05/23 0326  Mon Feb 05, 2023  0315 Per flow manager, consult was never actually paged to Dr. Sundil; was just paged.   [RS]  [phone] Consult Dr. Sundil, hospitalist, who is agreeable to admitting this patient to her service for obs. I appreciate her collaboration in the care of this patient.  [RS]    Clinical Course User Index [RS] Jayce Boyko, Pleasant SAUNDERS, PA-C   Medical Decision Making Amount and/or Complexity of Data Reviewed Labs: ordered. Radiology: ordered.  Risk Prescription drug management. Decision regarding hospitalization.   Care of this patient assumed from preceding new provider Lynwood Bugler, PA-C at time of shift change.  Please see his associated note for further insight and the patient's ED course.  In brief Patient is a 78 year old female with history of neurocognitive impairment, type 2 diabetes, afib on eliquis , who is coming from SNF, [facility], with altered mental status.  Per family via phone patient is normally communicative able to carry on a conversation but at this time is altered oriented only to herself.  Extensive workup by preceding ED team unremarkable however patient remains altered at this time.  Plan at signout is for admission for altered mental status.  Consult to hospitalist at this time.  This chart was dictated using voice recognition software, Dragon. Despite the best efforts of this provider to proofread and correct errors, errors may still occur which can change documentation meaning.       Bobette Pleasant SAUNDERS, PA-C 02/05/23 0326    Griselda Norris, MD 02/05/23 [phone]

## 2023-02-05 NOTE — Care Management (Signed)
 ED RN Case Manager received a secured chat from Dr. Jonel inquiring if patient can return to [facility].  Contacted [place] with [facility] who confirmed patient could return.  Updated [facility] ED RN with contact number to call report [phone] or [phone].  RNCM Called [facility].    Albert Gosling RN, BSN CNOR ED RN Care Manager [phone]

## 2023-02-05 NOTE — ED Notes (Signed)
 Unable to perform bladder scan @ this time d/t unable to locate on unit. However, patient has had 2 soiled briefs (with urine) on this shift.

## 2023-02-14 ENCOUNTER — Emergency Department (HOSPITAL_COMMUNITY): Payer: Medicare Other

## 2023-02-14 ENCOUNTER — Other Ambulatory Visit: Payer: Self-pay

## 2023-02-14 ENCOUNTER — Inpatient Hospital Stay (HOSPITAL_COMMUNITY)
Admission: EM | Admit: 2023-02-14 | Discharge: 2023-02-23 | DRG: 871 | Disposition: A | Discharge status: Skilled Nursing Facility | Payer: Medicare Other | Source: Skilled Nursing Facility | Attending: Internal Medicine | Admitting: Internal Medicine

## 2023-02-14 ENCOUNTER — Encounter (HOSPITAL_COMMUNITY): Payer: Self-pay | Admitting: *Deleted

## 2023-02-14 DIAGNOSIS — Z8249 Family history of ischemic heart disease and other diseases of the circulatory system: Secondary | ICD-10-CM

## 2023-02-14 DIAGNOSIS — Z955 Presence of coronary angioplasty implant and graft: Secondary | ICD-10-CM

## 2023-02-14 DIAGNOSIS — Z91040 Latex allergy status: Secondary | ICD-10-CM

## 2023-02-14 DIAGNOSIS — R4182 Altered mental status, unspecified: Secondary | ICD-10-CM | POA: Diagnosis present

## 2023-02-14 DIAGNOSIS — Z1152 Encounter for screening for COVID-19: Secondary | ICD-10-CM

## 2023-02-14 DIAGNOSIS — Z888 Allergy status to other drugs, medicaments and biological substances status: Secondary | ICD-10-CM

## 2023-02-14 DIAGNOSIS — R6520 Severe sepsis without septic shock: Secondary | ICD-10-CM | POA: Diagnosis present

## 2023-02-14 DIAGNOSIS — E118 Type 2 diabetes mellitus with unspecified complications: Secondary | ICD-10-CM | POA: Diagnosis not present

## 2023-02-14 DIAGNOSIS — Z1612 Extended spectrum beta lactamase (ESBL) resistance: Secondary | ICD-10-CM | POA: Diagnosis present

## 2023-02-14 DIAGNOSIS — E870 Hyperosmolality and hypernatremia: Secondary | ICD-10-CM | POA: Diagnosis not present

## 2023-02-14 DIAGNOSIS — F32A Depression, unspecified: Secondary | ICD-10-CM | POA: Diagnosis present

## 2023-02-14 DIAGNOSIS — Z833 Family history of diabetes mellitus: Secondary | ICD-10-CM

## 2023-02-14 DIAGNOSIS — Z7983 Long term (current) use of bisphosphonates: Secondary | ICD-10-CM

## 2023-02-14 DIAGNOSIS — F419 Anxiety disorder, unspecified: Secondary | ICD-10-CM | POA: Diagnosis present

## 2023-02-14 DIAGNOSIS — N390 Urinary tract infection, site not specified: Secondary | ICD-10-CM | POA: Diagnosis present

## 2023-02-14 DIAGNOSIS — F0390 Unspecified dementia without behavioral disturbance: Secondary | ICD-10-CM | POA: Diagnosis present

## 2023-02-14 DIAGNOSIS — Z952 Presence of prosthetic heart valve: Secondary | ICD-10-CM

## 2023-02-14 DIAGNOSIS — M797 Fibromyalgia: Secondary | ICD-10-CM | POA: Diagnosis present

## 2023-02-14 DIAGNOSIS — Z7984 Long term (current) use of oral hypoglycemic drugs: Secondary | ICD-10-CM

## 2023-02-14 DIAGNOSIS — J9601 Acute respiratory failure with hypoxia: Secondary | ICD-10-CM | POA: Diagnosis present

## 2023-02-14 DIAGNOSIS — I5032 Chronic diastolic (congestive) heart failure: Secondary | ICD-10-CM | POA: Diagnosis present

## 2023-02-14 DIAGNOSIS — A419 Sepsis, unspecified organism: Secondary | ICD-10-CM | POA: Diagnosis not present

## 2023-02-14 DIAGNOSIS — E785 Hyperlipidemia, unspecified: Secondary | ICD-10-CM | POA: Diagnosis present

## 2023-02-14 DIAGNOSIS — E119 Type 2 diabetes mellitus without complications: Secondary | ICD-10-CM | POA: Diagnosis present

## 2023-02-14 DIAGNOSIS — Z90711 Acquired absence of uterus with remaining cervical stump: Secondary | ICD-10-CM

## 2023-02-14 DIAGNOSIS — J189 Pneumonia, unspecified organism: Principal | ICD-10-CM | POA: Diagnosis present

## 2023-02-14 DIAGNOSIS — M810 Age-related osteoporosis without current pathological fracture: Secondary | ICD-10-CM | POA: Diagnosis present

## 2023-02-14 DIAGNOSIS — A4151 Sepsis due to Escherichia coli [E. coli]: Secondary | ICD-10-CM | POA: Diagnosis present

## 2023-02-14 DIAGNOSIS — E876 Hypokalemia: Secondary | ICD-10-CM | POA: Diagnosis present

## 2023-02-14 DIAGNOSIS — L89316 Pressure-induced deep tissue damage of right buttock: Secondary | ICD-10-CM | POA: Diagnosis present

## 2023-02-14 DIAGNOSIS — Z8614 Personal history of Methicillin resistant Staphylococcus aureus infection: Secondary | ICD-10-CM

## 2023-02-14 DIAGNOSIS — I110 Hypertensive heart disease with heart failure: Secondary | ICD-10-CM | POA: Diagnosis present

## 2023-02-14 DIAGNOSIS — K219 Gastro-esophageal reflux disease without esophagitis: Secondary | ICD-10-CM | POA: Diagnosis present

## 2023-02-14 DIAGNOSIS — Z79899 Other long term (current) drug therapy: Secondary | ICD-10-CM

## 2023-02-14 DIAGNOSIS — I480 Paroxysmal atrial fibrillation: Secondary | ICD-10-CM | POA: Diagnosis present

## 2023-02-14 DIAGNOSIS — I2510 Atherosclerotic heart disease of native coronary artery without angina pectoris: Secondary | ICD-10-CM | POA: Diagnosis present

## 2023-02-14 DIAGNOSIS — Z8659 Personal history of other mental and behavioral disorders: Secondary | ICD-10-CM

## 2023-02-14 DIAGNOSIS — Z8616 Personal history of COVID-19: Secondary | ICD-10-CM

## 2023-02-14 DIAGNOSIS — Z7982 Long term (current) use of aspirin: Secondary | ICD-10-CM

## 2023-02-14 DIAGNOSIS — Z880 Allergy status to penicillin: Secondary | ICD-10-CM

## 2023-02-14 DIAGNOSIS — Z96611 Presence of right artificial shoulder joint: Secondary | ICD-10-CM | POA: Diagnosis present

## 2023-02-14 DIAGNOSIS — Z66 Do not resuscitate: Secondary | ICD-10-CM | POA: Diagnosis present

## 2023-02-14 DIAGNOSIS — G9341 Metabolic encephalopathy: Secondary | ICD-10-CM | POA: Diagnosis present

## 2023-02-14 DIAGNOSIS — Z7901 Long term (current) use of anticoagulants: Secondary | ICD-10-CM

## 2023-02-14 DIAGNOSIS — Z96652 Presence of left artificial knee joint: Secondary | ICD-10-CM | POA: Diagnosis present

## 2023-02-14 DIAGNOSIS — E039 Hypothyroidism, unspecified: Secondary | ICD-10-CM | POA: Diagnosis present

## 2023-02-14 DIAGNOSIS — I10 Essential (primary) hypertension: Secondary | ICD-10-CM | POA: Diagnosis present

## 2023-02-14 LAB — URINALYSIS, W/ REFLEX TO CULTURE (INFECTION SUSPECTED)
Bilirubin Urine: NEGATIVE
Glucose, UA: NEGATIVE mg/dL
Ketones, ur: 5 mg/dL — AB
Nitrite: NEGATIVE
Protein, ur: 100 mg/dL — AB
Specific Gravity, Urine: 1.018 (ref 1.005–1.030)
WBC, UA: >50 WBC/hpf (ref 0–5)
pH: 5 (ref 5.0–8.0)

## 2023-02-14 LAB — CBC WITH DIFFERENTIAL/PLATELET
Abs Immature Granulocytes: 0.14 10*3/uL — ABNORMAL HIGH (ref 0.00–0.07)
Basophils Absolute: 0 10*3/uL (ref 0.0–0.1)
Basophils Relative: 0 %
Eosinophils Absolute: 0 10*3/uL (ref 0.0–0.5)
Eosinophils Relative: 0 %
HCT: 39.5 % (ref 36.0–46.0)
Hemoglobin: 12.5 g/dL (ref 12.0–15.0)
Immature Granulocytes: 1 %
Lymphocytes Relative: 6 %
Lymphs Abs: 1.4 10*3/uL (ref 0.7–4.0)
MCH: 30.3 pg (ref 26.0–34.0)
MCHC: 31.6 g/dL (ref 30.0–36.0)
MCV: 95.9 fL (ref 80.0–100.0)
Monocytes Absolute: 1.2 10*3/uL — ABNORMAL HIGH (ref 0.1–1.0)
Monocytes Relative: 6 %
Neutro Abs: 19.3 10*3/uL — ABNORMAL HIGH (ref 1.7–7.7)
Neutrophils Relative %: 87 %
Platelets: 341 10*3/uL (ref 150–400)
RBC: 4.12 MIL/uL (ref 3.87–5.11)
RDW: 13.7 % (ref 11.5–15.5)
WBC: 22 10*3/uL — ABNORMAL HIGH (ref 4.0–10.5)
nRBC: 0 % (ref 0.0–0.2)

## 2023-02-14 LAB — COMPREHENSIVE METABOLIC PANEL
ALT: 18 U/L (ref 0–44)
AST: 21 U/L (ref 15–41)
Albumin: 3.1 g/dL — ABNORMAL LOW (ref 3.5–5.0)
Alkaline Phosphatase: 61 U/L (ref 38–126)
Anion gap: 17 — ABNORMAL HIGH (ref 5–15)
BUN: 27 mg/dL — ABNORMAL HIGH (ref 8–23)
CO2: 23 mmol/L (ref 22–32)
Calcium: 9.3 mg/dL (ref 8.9–10.3)
Chloride: 104 mmol/L (ref 98–111)
Creatinine, Ser: 0.95 mg/dL (ref 0.44–1.00)
GFR, Estimated: >60 mL/min (ref >60)
Glucose, Bld: 197 mg/dL — ABNORMAL HIGH (ref 70–99)
Potassium: 3.4 mmol/L — ABNORMAL LOW (ref 3.5–5.1)
Sodium: 144 mmol/L (ref 135–145)
Total Bilirubin: 1.1 mg/dL (ref 0.0–1.2)
Total Protein: 6.9 g/dL (ref 6.5–8.1)

## 2023-02-14 LAB — RESP PANEL BY RT-PCR (RSV, FLU A&B, COVID)  RVPGX2
Influenza A by PCR: NEGATIVE
Influenza B by PCR: NEGATIVE
Resp Syncytial Virus by PCR: NEGATIVE
SARS Coronavirus 2 by RT PCR: NEGATIVE

## 2023-02-14 LAB — I-STAT CG4 LACTIC ACID, ED: Lactic Acid, Venous: 1.6 mmol/L (ref 0.5–1.9)

## 2023-02-14 MED ORDER — SODIUM CHLORIDE 0.9 % IV SOLN
2.0000 g | Freq: Once | INTRAVENOUS | Status: AC
  Administered 2023-02-14: 2 g via INTRAVENOUS
  Filled 2023-02-14: qty 12.5

## 2023-02-14 MED ORDER — ACETAMINOPHEN 650 MG RE SUPP
650.0000 mg | Freq: Once | RECTAL | Status: AC
  Administered 2023-02-14: 650 mg via RECTAL
  Filled 2023-02-14: qty 1

## 2023-02-14 MED ORDER — METRONIDAZOLE 500 MG/100ML IV SOLN
500.0000 mg | Freq: Once | INTRAVENOUS | Status: AC
  Administered 2023-02-14: 500 mg via INTRAVENOUS
  Filled 2023-02-14: qty 100

## 2023-02-14 MED ORDER — SODIUM CHLORIDE 0.9 % IV SOLN
2.0000 g | Freq: Once | INTRAVENOUS | Status: DC
  Filled 2023-02-14: qty 10

## 2023-02-14 MED ORDER — VANCOMYCIN HCL IN DEXTROSE 1-5 GM/200ML-% IV SOLN
1000.0000 mg | Freq: Once | INTRAVENOUS | Status: AC
  Administered 2023-02-14: 1000 mg via INTRAVENOUS
  Filled 2023-02-14: qty 200

## 2023-02-14 MED ORDER — SODIUM CHLORIDE 0.9 % IV BOLUS (SEPSIS)
1000.0000 mL | Freq: Once | INTRAVENOUS | Status: AC
  Administered 2023-02-14: 1000 mL via INTRAVENOUS

## 2023-02-14 MED ORDER — LACTATED RINGERS IV SOLN: INTRAVENOUS | Status: DC

## 2023-02-14 NOTE — ED Provider Notes (Signed)
Emergency Department Provider Note   I have reviewed the triage vital signs and the nursing notes.   HISTORY  Chief Complaint Code Sepsis   HPI Katherine [name] is a 78 y.o. female past history of diabetes, hypertension, hyperlipidemia, CAD presents to the emergency department from [facility] skilled nursing facility for mental status and hypoxemia. She was apparently diagnosed with COVID 1 week prior. Staff described her as being "more lethargic." EMS was called and found her to be satting 79% on room air.  She was started on 2 L nasal cannula and transported to the ED.  Patient unable to provide significant history due to altered mental status.  Level 5 caveat applies.   Past Medical History:  Diagnosis Date   ANXIETY 07/31/2007   Atrial fibrillation (HCC)    a. per structural notes, previous hx remote Afib.   CAD (coronary artery disease)    a. 03/2019 orbital atherectomy/PCI with DES to RCA, prox Cx, and mid Cx.   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   s/p TAVR with a 26 mm Edwards S3U via the TF approach by Drs Excell Seltzer and Cornelius Moras   Severe aortic stenosis    s/p TAVR   Unspecified hearing loss 10/09/2007    Review of Systems  Level 5 caveat: AMS ____________________________________________   PHYSICAL EXAM:  VITAL SIGNS: ED Triage Vitals  Encounter Vitals Group     BP 02/14/23 2114 126/86     Pulse Rate 02/14/23 2114 (!) 111     Resp 02/14/23 2114 (!) 36     Temp 02/14/23 2114 (!) 103.1 F (39.5 C)     Temp Source 02/14/23 2114 Rectal     SpO2 02/14/23 2112 98 %     Weight 02/14/23 2116 119 lb 7.8 oz (54.2 kg)   Constitutional: Alert but confused.  Appears generally unwell but no acute distress.  Eyes: Conjunctivae are normal.  Head: Atraumatic. Nose: No congestion/rhinnorhea. Mouth/Throat: Mucous membranes are slightly dry.  Neck: No stridor.   Cardiovascular: Tachycardia. Good peripheral circulation. Grossly normal heart sounds.   Respiratory: Normal respiratory effort.  No retractions. Lungs CTAB. Gastrointestinal: Soft and nontender. No distention.  Musculoskeletal: No gross deformities of extremities. Neurologic: Moving all extremities spontaneously.  Skin:  Skin is warm, dry and intact. No rash noted.  ____________________________________________   LABS (all labs ordered are listed, but only abnormal results are displayed)  Labs Reviewed  URINE CULTURE - Abnormal; Notable for the following components:      Result Value   Culture   (*)    Value: >=100,000 COLONIES/mL GRAM NEGATIVE RODS IDENTIFICATION AND SUSCEPTIBILITIES TO FOLLOW Performed at [facility], [address], [place], [place] [address]    All other components within normal limits  BLOOD CULTURE ID PANEL (REFLEXED) - BCID2 - Abnormal; Notable for the following components:   Staphylococcus species DETECTED (*)    Staphylococcus epidermidis DETECTED (*)    Methicillin resistance mecA/C DETECTED (*)    All other components within normal limits  COMPREHENSIVE METABOLIC PANEL - Abnormal; Notable for the following components:   Potassium 3.4 (*)    Glucose, Bld 197 (*)    BUN 27 (*)  Albumin 3.1 (*)    Anion gap 17 (*)    All other components within normal limits  CBC WITH DIFFERENTIAL/PLATELET - Abnormal; Notable for the following components:   WBC 22.0 (*)    Neutro Abs 19.3 (*)    Monocytes Absolute 1.2 (*)    Abs Immature Granulocytes 0.14 (*)    All other components within normal limits  URINALYSIS, W/ REFLEX TO CULTURE (INFECTION SUSPECTED) - Abnormal; Notable for the following components:   Color, Urine AMBER (*)    APPearance TURBID (*)     Hgb urine dipstick SMALL (*)    Ketones, ur 5 (*)    Protein, ur 100 (*)    Leukocytes,Ua MODERATE (*)    Bacteria, UA MANY (*)    Non Squamous Epithelial 0-5 (*)    All other components within normal limits  PROTIME-INR - Abnormal; Notable for the following components:   Prothrombin Time 19.0 (*)    INR 1.6 (*)    All other components within normal limits  HEMOGLOBIN A1C - Abnormal; Notable for the following components:   Hgb A1c MFr Bld 5.9 (*)    All other components within normal limits  BASIC METABOLIC PANEL - Abnormal; Notable for the following components:   Sodium 146 (*)    Potassium 2.9 (*)    Glucose, Bld 147 (*)    BUN 27 (*)    Calcium 8.4 (*)    All other components within normal limits  CBC - Abnormal; Notable for the following components:   WBC 20.8 (*)    RBC 3.67 (*)    Hemoglobin 11.2 (*)    HCT 35.4 (*)    All other components within normal limits  PROTIME-INR - Abnormal; Notable for the following components:   Prothrombin Time 18.9 (*)    INR 1.6 (*)    All other components within normal limits  TSH - Abnormal; Notable for the following components:   TSH 0.321 (*)    All other components within normal limits  MAGNESIUM - Abnormal; Notable for the following components:   Magnesium 1.5 (*)    All other components within normal limits  POTASSIUM - Abnormal; Notable for the following components:   Potassium 3.1 (*)    All other components within normal limits  GLUCOSE, CAPILLARY - Abnormal; Notable for the following components:   Glucose-Capillary 118 (*)    All other components within normal limits  CBG MONITORING, ED - Abnormal; Notable for the following components:   Glucose-Capillary 136 (*)    All other components within normal limits  CBG MONITORING, ED - Abnormal; Notable for the following components:   Glucose-Capillary 116 (*)    All other components within normal limits  CBG MONITORING, ED - Abnormal; Notable for the following components:    Glucose-Capillary 116 (*)    All other components within normal limits  TROPONIN I (HIGH SENSITIVITY) - Abnormal; Notable for the following components:   Troponin I (High Sensitivity) 55 (*)    All other components within normal limits  TROPONIN I (HIGH SENSITIVITY) - Abnormal; Notable for the following components:   Troponin I (High Sensitivity) 24 (*)    All other components within normal limits  RESP PANEL BY RT-PCR (RSV, FLU A&B, COVID)  RVPGX2  CULTURE, BLOOD (ROUTINE X 2)  CULTURE, BLOOD (ROUTINE X 2)  MRSA NEXT GEN BY PCR, NASAL  APTT  CBC  BASIC METABOLIC PANEL  I-STAT CG4 LACTIC ACID, ED   ____________________________________________  EKG  EKG Interpretation Date/Time:  Wednesday February 14 2023 21:15:54 EST Ventricular Rate:  120 PR Interval:  136 QRS Duration:  83 QT Interval:  372 QTC Calculation: 526 R Axis:   -58  Text Interpretation: Sinus tachycardia Multiform ventricular premature complexes Inferior infarct, old Consider anterior infarct Prolonged QT interval Confirmed by Ross Marcus ([id number]) on 02/15/2023 1:18:52 PM        ____________________________________________  RADIOLOGY  CT HEAD WO CONTRAST ( ) Result Date: 02/15/2023 CLINICAL DATA:  Hypoxia and lethargy in the setting of COVID. Mental status change with unknown cause. EXAM: CT HEAD WITHOUT CONTRAST TECHNIQUE: Contiguous axial images were obtained from the base of the skull through the vertex without intravenous contrast. RADIATION DOSE REDUCTION: This exam was performed according to the departmental dose-optimization program which includes automated exposure control, adjustment of the mA and/or kV according to patient size and/or use of iterative reconstruction technique. COMPARISON:  Brain MRI 02/05/2023 FINDINGS: Brain: No evidence of acute infarction, hemorrhage, hydrocephalus, extra-axial collection or mass lesion/mass effect. Bands of streak artifact best seen on reformats. Mild  chronic small vessel disease in the cerebral white matter for age. Vascular: No hyperdense vessel or unexpected calcification. Skull: Normal. Negative for fracture or focal lesion. Sinuses/Orbits: No acute finding. IMPRESSION: Aging brain without acute or interval finding. Electronically Signed   By: Tiburcio Pea M.D.   On: 02/15/2023 05:31    ____________________________________________   PROCEDURES  Procedure(s) performed:   Procedures  CRITICAL CARE Performed by: Maia Plan Total critical care time: 35 minutes Critical care time was exclusive of separately billable procedures and treating other patients. Critical care was necessary to treat or prevent imminent or life-threatening deterioration. Critical care was time spent personally by me on the following activities: development of treatment plan with patient and/or surrogate as well as nursing, discussions with consultants, evaluation of patient's response to treatment, examination of patient, obtaining history from patient or surrogate, ordering and performing treatments and interventions, ordering and review of laboratory studies, ordering and review of radiographic studies, pulse oximetry and re-evaluation of patient's condition.  Alona Bene, MD Emergency Medicine  ____________________________________________   INITIAL IMPRESSION / ASSESSMENT AND PLAN / ED COURSE  Pertinent labs & imaging results that were available during my care of the patient were reviewed by me and considered in my medical decision making (see chart for details).   This patient is Presenting for Evaluation of AMS/fever/hypoxemia, which does require a range of treatment options, and is a complaint that involves a high risk of morbidity and mortality.  The Differential Diagnoses include COVID, Flu, sepsis, CAP, UTI, etc.  Critical Interventions-    Medications  risperiDONE (RISPERDAL) tablet 2 mg (2 mg Oral Given 02/15/23 2157)  rosuvastatin  (CRESTOR) tablet 40 mg (40 mg Oral Given 02/15/23 2157)  pantoprazole (PROTONIX) EC tablet 40 mg (40 mg Oral Not Given 02/15/23 0950)  apixaban (ELIQUIS) tablet 5 mg (5 mg Oral Given 02/15/23 2157)  benztropine (COGENTIN) tablet 0.5 mg (0.5 mg Oral Given 02/15/23 2158)  magnesium oxide (MAG-OX) tablet 400 mg (400 mg Oral Not Given 02/15/23 0950)  zinc oxide (BALMEX) 11.3 % cream 1 Application (1 Application Topical Given 02/15/23 2158)  insulin aspart (novoLOG) injection 0-9 Units ( Subcutaneous Not Given 02/15/23 1644)  acetaminophen (TYLENOL) tablet 650 mg ( Oral See Alternative 02/15/23 1138)    Or  acetaminophen (TYLENOL) suppository 650 mg (650 mg Rectal Given 02/15/23 1138)  azithromycin (ZITHROMAX) 500 mg in sodium chloride 0.9 % 250 mL IVPB (0  mg Intravenous Stopped 02/15/23 0714)  ceFEPIme (MAXIPIME) 2 g in sodium chloride 0.9 % 100 mL IVPB (2 g Intravenous New Bag/Given 02/15/23 2158)  vancomycin (VANCOREADY) IVPB 1250 mg/250 mL (has no administration in time range)  memantine (NAMENDA XR) 24 hr capsule 28 mg (28 mg Oral Given 02/15/23 1515)    And  donepezil (ARICEPT) tablet 10 mg (10 mg Oral Given 02/15/23 1515)  0.9 %  sodium chloride infusion ( Intravenous New Bag/Given 02/15/23 1518)  sodium chloride 0.9 % bolus 1,000 mL (0 mLs Intravenous Stopped 02/14/23 2257)  metroNIDAZOLE (FLAGYL) IVPB 500 mg (0 mg Intravenous Stopped 02/14/23 2258)  vancomycin (VANCOCIN) IVPB 1000 mg/200 mL premix (0 mg Intravenous Stopped 02/14/23 2328)  ceFEPIme (MAXIPIME) 2 g in sodium chloride 0.9 % 100 mL IVPB (0 g Intravenous Stopped 02/14/23 2235)  acetaminophen (TYLENOL) suppository 650 mg (650 mg Rectal Given 02/14/23 2245)  potassium chloride (KLOR-CON) packet 20 mEq (20 mEq Oral Given 02/15/23 0600)  magnesium sulfate IVPB 2 g 50 mL (0 g Intravenous Stopped 02/15/23 1618)  potassium chloride SA (KLOR-CON M) CR tablet 60 mEq (60 mEq Oral Given 02/15/23 1751)    Reassessment after intervention: tachycardia  improved.    I did obtain Additional Historical Information from EMS.  Clinical Laboratory Tests Ordered, included lactic acid normal.  CBC with significant leukocytosis to 20.8.  No acute kidney injury. COVID negative. UA consistent with infection.   Radiologic Tests Ordered, included CXR. I independently interpreted the images and agree with radiology interpretation.   Cardiac Monitor Tracing which shows NSR.    Social Determinants of Health Risk patient is a non-smoker.   Consult complete with [facility]. Plan for admit.   Medical Decision Making: Summary:  Patient presents to the emergency department with altered mental status, fever, concern for infection and developing sepsis.  Lactic acid normal.  No hypotension to suspect severe sepsis or septic shock.  CBC does show significant leukocytosis.  Patient has a new O2 requirement and likely pneumonia on chest x-ray. Abx, fluids, and cultures started.   Reevaluation with update and discussion with patient. She is protecting airway and stable for admit to [facility].    Patient's presentation is most consistent with acute presentation with potential threat to life or bodily function.   Disposition: admit  ____________________________________________  FINAL CLINICAL IMPRESSION(S) / ED DIAGNOSES  Final diagnoses:  Community acquired pneumonia of right lower lobe of lung  Sepsis, due to unspecified organism, unspecified whether acute organ dysfunction present (HCC)  Acute respiratory failure with hypoxia (HCC)    Note:  This document was prepared using Dragon voice recognition software and may include unintentional dictation errors.  Alona Bene, MD, [facility] Emergency Medicine    Moshe Wenger, Arlyss Repress, MD 02/16/23 [phone]

## 2023-02-14 NOTE — ED Triage Notes (Signed)
 Pt BIB EMS from [facility]. Pt dx with Covid 1/7. Pt found hypoxic by staff at 79% SpO2 on 2L nasal cannula. Per staff patient "more lethargic." Pt hot,and febrile with EMS. Baseline dementia, but facility staff states patient is normally and alert and communicative.

## 2023-02-15 ENCOUNTER — Inpatient Hospital Stay (HOSPITAL_COMMUNITY): Payer: Medicare Other

## 2023-02-15 ENCOUNTER — Encounter (HOSPITAL_COMMUNITY): Payer: Self-pay | Admitting: Internal Medicine

## 2023-02-15 DIAGNOSIS — A419 Sepsis, unspecified organism: Secondary | ICD-10-CM | POA: Diagnosis not present

## 2023-02-15 DIAGNOSIS — J189 Pneumonia, unspecified organism: Secondary | ICD-10-CM

## 2023-02-15 DIAGNOSIS — E118 Type 2 diabetes mellitus with unspecified complications: Secondary | ICD-10-CM | POA: Diagnosis not present

## 2023-02-15 DIAGNOSIS — J9601 Acute respiratory failure with hypoxia: Secondary | ICD-10-CM | POA: Diagnosis not present

## 2023-02-15 DIAGNOSIS — G9341 Metabolic encephalopathy: Secondary | ICD-10-CM | POA: Insufficient documentation

## 2023-02-15 LAB — CBC
HCT: 35.4 % — ABNORMAL LOW (ref 36.0–46.0)
Hemoglobin: 11.2 g/dL — ABNORMAL LOW (ref 12.0–15.0)
MCH: 30.5 pg (ref 26.0–34.0)
MCHC: 31.6 g/dL (ref 30.0–36.0)
MCV: 96.5 fL (ref 80.0–100.0)
Platelets: 273 10*3/uL (ref 150–400)
RBC: 3.67 MIL/uL — ABNORMAL LOW (ref 3.87–5.11)
RDW: 13.8 % (ref 11.5–15.5)
WBC: 20.8 10*3/uL — ABNORMAL HIGH (ref 4.0–10.5)
nRBC: 0 % (ref 0.0–0.2)

## 2023-02-15 LAB — BLOOD CULTURE ID PANEL (REFLEXED) - BCID2

## 2023-02-15 LAB — MRSA NEXT GEN BY PCR, NASAL: MRSA by PCR Next Gen: NOT DETECTED

## 2023-02-15 LAB — BASIC METABOLIC PANEL
Anion gap: 14 (ref 5–15)
BUN: 27 mg/dL — ABNORMAL HIGH (ref 8–23)
CO2: 23 mmol/L (ref 22–32)
Calcium: 8.4 mg/dL — ABNORMAL LOW (ref 8.9–10.3)
Chloride: 109 mmol/L (ref 98–111)
Creatinine, Ser: 0.93 mg/dL (ref 0.44–1.00)
GFR, Estimated: >60 mL/min (ref >60)
Glucose, Bld: 147 mg/dL — ABNORMAL HIGH (ref 70–99)
Potassium: 2.9 mmol/L — ABNORMAL LOW (ref 3.5–5.1)
Sodium: 146 mmol/L — ABNORMAL HIGH (ref 135–145)

## 2023-02-15 LAB — CBG MONITORING, ED
Glucose-Capillary: 136 mg/dL — ABNORMAL HIGH (ref 70–99)
Glucose-Capillary: 116 mg/dL — ABNORMAL HIGH (ref 70–99)
Glucose-Capillary: 116 mg/dL — ABNORMAL HIGH (ref 70–99)

## 2023-02-15 LAB — PROTIME-INR
INR: 1.6 — ABNORMAL HIGH (ref 0.8–1.2)
INR: 1.6 — ABNORMAL HIGH (ref 0.8–1.2)
Prothrombin Time: 18.9 s — ABNORMAL HIGH (ref 11.4–15.2)
Prothrombin Time: 19 s — ABNORMAL HIGH (ref 11.4–15.2)

## 2023-02-15 LAB — HEMOGLOBIN A1C
Hgb A1c MFr Bld: 5.9 % — ABNORMAL HIGH (ref 4.8–5.6)
Mean Plasma Glucose: 122.63 mg/dL

## 2023-02-15 LAB — TROPONIN I (HIGH SENSITIVITY)
Troponin I (High Sensitivity): 24 ng/L — ABNORMAL HIGH (ref <18)
Troponin I (High Sensitivity): 55 ng/L — ABNORMAL HIGH (ref <18)

## 2023-02-15 LAB — GLUCOSE, CAPILLARY: Glucose-Capillary: 118 mg/dL — ABNORMAL HIGH (ref 70–99)

## 2023-02-15 LAB — APTT: aPTT: 31 s (ref 24–36)

## 2023-02-15 LAB — TSH: TSH: 0.321 u[IU]/mL — ABNORMAL LOW (ref 0.350–4.500)

## 2023-02-15 LAB — POTASSIUM: Potassium: 3.1 mmol/L — ABNORMAL LOW (ref 3.5–5.1)

## 2023-02-15 LAB — MAGNESIUM: Magnesium: 1.5 mg/dL — ABNORMAL LOW (ref 1.7–2.4)

## 2023-02-15 MED ORDER — MAGNESIUM OXIDE -MG SUPPLEMENT 400 (240 MG) MG PO TABS
400.0000 mg | ORAL_TABLET | Freq: Every day | ORAL | Status: DC
  Administered 2023-02-16 – 2023-02-23 (×8): 400 mg via ORAL
  Filled 2023-02-15 (×9): qty 1

## 2023-02-15 MED ORDER — POTASSIUM CHLORIDE CRYS ER 20 MEQ PO TBCR
60.0000 meq | EXTENDED_RELEASE_TABLET | Freq: Once | ORAL | Status: AC
  Administered 2023-02-15: 60 meq via ORAL
  Filled 2023-02-15: qty 3

## 2023-02-15 MED ORDER — POTASSIUM CHLORIDE 20 MEQ PO PACK
20.0000 meq | PACK | Freq: Once | ORAL | Status: AC
  Administered 2023-02-15: 20 meq via ORAL

## 2023-02-15 MED ORDER — POTASSIUM CHLORIDE CRYS ER 20 MEQ PO TBCR
20.0000 meq | EXTENDED_RELEASE_TABLET | Freq: Every day | ORAL | Status: DC
  Filled 2023-02-15: qty 1

## 2023-02-15 MED ORDER — DONEPEZIL HCL 10 MG PO TABS
10.0000 mg | ORAL_TABLET | Freq: Every day | ORAL | Status: DC
  Administered 2023-02-15 – 2023-02-23 (×9): 10 mg via ORAL
  Filled 2023-02-15 (×9): qty 1

## 2023-02-15 MED ORDER — ACETAMINOPHEN 325 MG PO TABS: 650.0000 mg | ORAL_TABLET | Freq: Four times a day (QID) | ORAL | Status: DC | PRN

## 2023-02-15 MED ORDER — POTASSIUM CHLORIDE 20 MEQ PO PACK
60.0000 meq | PACK | Freq: Once | ORAL | Status: DC
  Filled 2023-02-15: qty 3

## 2023-02-15 MED ORDER — SODIUM CHLORIDE 0.9 % IV SOLN
2.0000 g | Freq: Two times a day (BID) | INTRAVENOUS | Status: DC
  Administered 2023-02-15 (×2): 2 g via INTRAVENOUS
  Filled 2023-02-15 (×2): qty 12.5

## 2023-02-15 MED ORDER — SODIUM CHLORIDE 0.9 % IV SOLN: INTRAVENOUS | Status: DC

## 2023-02-15 MED ORDER — MEMANTINE HCL-DONEPEZIL HCL ER 28-10 MG PO CP24: 1.0000 | ORAL_CAPSULE | Freq: Every morning | ORAL | Status: DC

## 2023-02-15 MED ORDER — APIXABAN 5 MG PO TABS
5.0000 mg | ORAL_TABLET | Freq: Two times a day (BID) | ORAL | Status: DC
  Administered 2023-02-15 – 2023-02-23 (×16): 5 mg via ORAL
  Filled 2023-02-15 (×17): qty 1

## 2023-02-15 MED ORDER — RISPERIDONE 2 MG PO TABS
2.0000 mg | ORAL_TABLET | Freq: Every day | ORAL | Status: DC
  Administered 2023-02-15 – 2023-02-22 (×8): 2 mg via ORAL
  Filled 2023-02-15 (×9): qty 1

## 2023-02-15 MED ORDER — VANCOMYCIN HCL IN DEXTROSE 1-5 GM/200ML-% IV SOLN: 1000.0000 mg | Freq: Once | INTRAVENOUS | Status: DC

## 2023-02-15 MED ORDER — VANCOMYCIN HCL 1250 MG/250ML IV SOLN: 1250.0000 mg | INTRAVENOUS | Status: DC

## 2023-02-15 MED ORDER — SODIUM CHLORIDE 0.9 % IV SOLN
500.0000 mg | INTRAVENOUS | Status: DC
  Administered 2023-02-15 – 2023-02-16 (×2): 500 mg via INTRAVENOUS
  Filled 2023-02-15 (×2): qty 5

## 2023-02-15 MED ORDER — ZINC OXIDE 11.3 % EX CREA
1.0000 | TOPICAL_CREAM | Freq: Two times a day (BID) | CUTANEOUS | Status: DC
  Administered 2023-02-15 – 2023-02-23 (×16): 1 via TOPICAL
  Filled 2023-02-15: qty 56

## 2023-02-15 MED ORDER — MEMANTINE HCL ER 28 MG PO CP24
28.0000 mg | ORAL_CAPSULE | Freq: Every day | ORAL | Status: DC
  Administered 2023-02-15 – 2023-02-23 (×9): 28 mg via ORAL
  Filled 2023-02-15 (×10): qty 1

## 2023-02-15 MED ORDER — SODIUM CHLORIDE 0.9 % IV SOLN: 2.0000 g | Freq: Once | INTRAVENOUS | Status: DC

## 2023-02-15 MED ORDER — ACETAMINOPHEN 650 MG RE SUPP
650.0000 mg | Freq: Four times a day (QID) | RECTAL | Status: DC | PRN
  Administered 2023-02-15: 650 mg via RECTAL
  Filled 2023-02-15: qty 1

## 2023-02-15 MED ORDER — MAGNESIUM SULFATE 2 GM/50ML IV SOLN
2.0000 g | Freq: Once | INTRAVENOUS | Status: AC
  Administered 2023-02-15: 2 g via INTRAVENOUS
  Filled 2023-02-15: qty 50

## 2023-02-15 MED ORDER — POTASSIUM CHLORIDE CRYS ER 20 MEQ PO TBCR: 60.0000 meq | EXTENDED_RELEASE_TABLET | Freq: Once | ORAL | Status: DC

## 2023-02-15 MED ORDER — LACTATED RINGERS IV SOLN: 150.0000 mL/h | INTRAVENOUS | Status: DC

## 2023-02-15 MED ORDER — PANTOPRAZOLE SODIUM 40 MG PO TBEC
40.0000 mg | DELAYED_RELEASE_TABLET | Freq: Every day | ORAL | Status: DC
  Administered 2023-02-16 – 2023-02-23 (×8): 40 mg via ORAL
  Filled 2023-02-15 (×9): qty 1

## 2023-02-15 MED ORDER — LACTATED RINGERS IV SOLN: INTRAVENOUS | Status: DC

## 2023-02-15 MED ORDER — INSULIN ASPART 100 UNIT/ML IJ SOLN
0.0000 [IU] | Freq: Three times a day (TID) | INTRAMUSCULAR | Status: DC
  Administered 2023-02-15 – 2023-02-16 (×2): 1 [IU] via SUBCUTANEOUS
  Administered 2023-02-17 (×2): 2 [IU] via SUBCUTANEOUS
  Administered 2023-02-17 – 2023-02-18 (×2): 1 [IU] via SUBCUTANEOUS
  Administered 2023-02-18: 2 [IU] via SUBCUTANEOUS
  Administered 2023-02-19: 1 [IU] via SUBCUTANEOUS
  Administered 2023-02-19: 2 [IU] via SUBCUTANEOUS
  Administered 2023-02-20: 1 [IU] via SUBCUTANEOUS
  Administered 2023-02-22: 2 [IU] via SUBCUTANEOUS
  Administered 2023-02-22: 1 [IU] via SUBCUTANEOUS
  Administered 2023-02-23: 2 [IU] via SUBCUTANEOUS

## 2023-02-15 MED ORDER — ROSUVASTATIN CALCIUM 20 MG PO TABS
40.0000 mg | ORAL_TABLET | Freq: Every day | ORAL | Status: DC
  Administered 2023-02-15 – 2023-02-22 (×8): 40 mg via ORAL
  Filled 2023-02-15 (×8): qty 2

## 2023-02-15 MED ORDER — BENZTROPINE MESYLATE 0.5 MG PO TABS
0.5000 mg | ORAL_TABLET | Freq: Two times a day (BID) | ORAL | Status: DC
  Administered 2023-02-15 – 2023-02-23 (×16): 0.5 mg via ORAL
  Filled 2023-02-15 (×18): qty 1

## 2023-02-15 NOTE — Progress Notes (Signed)
Brief same day note:  Patient is a 78 year old female with history of coronary disease status post PCI, diabetes type 2, TAVR, dementia with behavioral disturbances, GERD who presented from skilled nursing facility with complaint of shortness of breath, fever, cough, confusion increased from baseline.  On presentation, she was hypoxic requiring 2 L of oxygen, febrile, hypotensive.  Labs showed leukocytosis.  Chest x-ray showed possible bilateral infiltrates.  COVID/flu/RSV negative.  Patient was started on broad spectrum antibiotics for pneumonia.  Patient seen and examined at the bedside this morning.  During my evaluation, she was lying in bed.  She did not look in any kind of distress.  She was confused but not agitated.  She was on 3 L of oxygen.  Afebrile this morning.  Has tremors in both hands.  Blood pressure was better with systolic in the range of 90s.  Assessment and plan  Sepsis secondary to pneumonia: Presented with fever, cough, shortness of breath, leukocytosis.  COVID-positive about 10 days ago and was admitted here.  Likely postviral pneumonia.  Continue current antibiotics.  Continue IV fluids.  Follow-up cultures.  Low threshold to de-escalate the antibiotics in the next 12 to 24 hours  Acute hypoxic respiratory  failure: Secondary to pneumonia/recent COVID.  On 2 L.  Continue to wean.  Hypokalemia: Being aggressively supplemented, magnesium as well  Diabetes type 2: Takes metformin.  Currently on sliding scale.  Monitor blood sugars  History of coronary artery disease: Status post PCI.  Metoprolol on hold due to hypotension.  On Crestor, Eliquis  Acute metabolic encephalopathy: Secondary to sepsis/pneumonia.  CT head did not show any acute findings.  History of dementia: On Risperdal, Cogentin, Namenda.  Frequent reorientation, delirium precautions  Hypertension: Home metoprolol, hydrochlorothiazide on hold due to hypotension  GERD: Continue PPI  Paroxysmal A-fib: Takes  metoprolol, Eliquis.  Monitor on telemetry  I called her sister Charlean Sanfilippo to update about the patient x 2 ,call not received

## 2023-02-15 NOTE — Progress Notes (Signed)
Pharmacy Antibiotic Note  Katherine [name] is a 78 y.o. female admitted on 02/14/2023 with AMS and hypoxemia from nursing home. PMH includes recent COVID infection ~1 week ago, diabetes, hypertension, hyperlipidemia, CAD.  Pharmacy has been consulted for cefepime/vancomycin dosing.  -CXR: streaky opacity in the left greater than right lung base. Atelectasis vs pneumonia -WBC 22, sCr 0.95, lactate 1.6, Tmax 103.1 -Received Cefepime 2g IV x1, and vancomycin LD -Blood and urine cultures collected -No MRSA PCR ordered  Plan: -Cefepime 2g IV every 12 hours -Vancomycin 1250 mg IV every 48 hours (AUC 462, Vd 0.72, IBW) -Order MRSA PCR -Monitor renal function -Follow up signs of clinical improvement, LOT, de-escalation of antibiotics   Height: 4\' 11"  (149.9 cm) Weight: 57.8 kg (127 lb 6.8 oz) IBW/kg (Calculated) : 43.2  Temp (24hrs), Avg:103.1 F (39.5 C), Min:103.1 F (39.5 C), Max:103.1 F (39.5 C)  Recent Labs  Lab 02/14/23 2130 02/14/23 2225  WBC 22.0*  --   CREATININE 0.95  --   LATICACIDVEN  --  1.6    Estimated Creatinine Clearance: 38.4 mL/min (by C-G formula based on SCr of 0.95 mg/dL).    Antimicrobials this admission: Cefepime 1/15 >>  Vancomycin 1/15 >>   Microbiology results: 1/15 BCx:  1/15 UCx:  1/16 MRSA PCR:   Thank you for allowing pharmacy to be a part of this patient's care.  Arabella Merles, PharmD. Clinical Pharmacist 02/15/2023 4:17 AM

## 2023-02-15 NOTE — ED Notes (Signed)
Help get patient sheets changed and placed another brief patient is resting with call bell in reach

## 2023-02-15 NOTE — ED Notes (Signed)
Checked patient cbg it was 91 notified RN of blood sugar

## 2023-02-15 NOTE — ED Notes (Signed)
Help get patient cleaned up placed a clean sheet and a brief patient is resting with call bell in reach

## 2023-02-15 NOTE — H&P (Addendum)
History and Physical    Katherine [name] YQM:[id number] DOB: 08/06/1945 DOA: 02/14/2023  Patient coming from: Skilled nursing facility.  Most of the history is obtained from patient's sister Mrs. Para March.  Patient has dementia.  Chief Complaint: Hypoxia altered mental status.  HPI: Katherine [name] is a 78 y.o. female with history of CAD status post PCI, diabetes mellitus type 2, status post TAVR, dementia with behavioral disturbances, GERD who was recently admitted on February 05, 2023 for acute delirium in the setting of COVID infection was noticed to be having increasing difficulty breathing and fever with cough and altered mental status which has gradually worsened over the last 48 hours.  EMS on arrival found that patient was hypoxic requiring 2 L oxygen and patient was altered.  ED Course: In the ER patient had a fever of 103 F with blood pressure in the 90s systolic.  Labs show significant leukocytosis of 22,000 chest x-ray shows possible infiltrates.  COVID and flu test were negative.  Patient initially was lethargic following starting of antibiotics and fluids patient was alert awake and following commands and answering questions.  Patient admitted for sepsis secondary to pneumonia.  Review of Systems: As per HPI, rest all negative.   Past Medical History:  Diagnosis Date   ANXIETY 07/31/2007   Atrial fibrillation (HCC)    a. per structural notes, previous hx remote Afib.   CAD (coronary artery disease)    a. 03/2019 orbital atherectomy/PCI with DES to RCA, prox Cx, and mid Cx.   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   s/p TAVR with a 26 mm Edwards S3U via the TF  approach by Drs Excell Seltzer and Cornelius Moras   Severe aortic stenosis    s/p TAVR   Unspecified hearing loss 10/09/2007    Past Surgical History:  Procedure Laterality Date   ABDOMINAL HYSTERECTOMY  12/08   Bladder tac, partial hysterectomy   BREAST BIOPSY     CARDIAC CATHETERIZATION     catarct removal     both eyes   COLONOSCOPY     CORONARY ATHERECTOMY N/A 04/15/2019   Procedure: CORONARY ATHERECTOMY;  Surgeon: [name], MD;  Location: [facility] INVASIVE CV LAB;  Service: Cardiovascular;  Laterality: N/A;   CORONARY STENT INTERVENTION N/A 04/15/2019   Procedure: CORONARY STENT INTERVENTION;  Surgeon: [name], MD;  Location: [facility] INVASIVE CV LAB;  Service: Cardiovascular;  Laterality: N/A;   I & D KNEE WITH POLY EXCHANGE Left 01/03/2013   Procedure: IRRIGATION AND DEBRIDEMENT LEFT KNEE WITH POLY EXCHANGE;  Surgeon: Kathryne Hitch, MD;  Location: [facility] ORS;  Service: Orthopedics;  Laterality: Left;   inguinal herniorrhapy     right Dr. Freida Busman 06/2009   REVERSE SHOULDER ARTHROPLASTY Right 09/07/2016   REVERSE SHOULDER ARTHROPLASTY Right 09/07/2016   Procedure: REVERSE SHOULDER ARTHROPLASTY;  Surgeon: Jones Broom, MD;  Location: [facility] OR;  Service: Orthopedics;  Laterality: Right;  Right reverse shoulder arthroplasty   RIGHT/LEFT HEART CATH AND CORONARY ANGIOGRAPHY N/A 09/11/2018   Procedure: RIGHT/LEFT HEART CATH AND CORONARY ANGIOGRAPHY;  Surgeon: [name], MD;  Location: [facility] INVASIVE CV LAB;  Service: Cardiovascular;  Laterality: N/A;   TEE WITHOUT CARDIOVERSION N/A 04/22/2019   Procedure:  TRANSESOPHAGEAL ECHOCARDIOGRAM (TEE);  Surgeon: [name], MD;  Location: [facility] INVASIVE CV LAB;  Service: Open Heart Surgery;  Laterality: N/A;   TONSILLECTOMY     TOTAL KNEE ARTHROPLASTY Left 12/20/2012   Procedure: LEFT TOTAL KNEE ARTHROPLASTY;  Surgeon: Kathryne Hitch, MD;  Location: [facility] OR[facility];  Service: Orthopedics;  Laterality: Left;   TRANSCATHETER AORTIC VALVE REPLACEMENT, TRANSFEMORAL  N/A 04/22/2019   Procedure: TRANSCATHETER AORTIC VALVE REPLACEMENT, TRANSFEMORAL;  Surgeon: [name], MD;  Location: [facility] INVASIVE CV LAB;  Service: Open Heart Surgery;  Laterality: N/A;     reports that she has never smoked. She has never used smokeless tobacco. She reports that she does not drink alcohol and does not use drugs.  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 PATIENT HAS HAD A PCN REACTION WITH IMMEDIATE RASH, FACIAL/TONGUE/THROAT SWELLING, SOB, OR LIGHTHEADEDNESS WITH HYPOTENSION:  #  #  #  YES  #  #  #   Has patient had a PCN reaction causing severe rash involving mucus membranes or skin necrosis: Unknown Has patient had a PCN reaction that required hospitalization: Unknown Has patient had a PCN reaction occurring within the last 10 years: Unknown    Euphorbia Other (See Comments)    Not listed on MAR   Coconut (Cocos Nucifera) Itching   Latex Rash   Mellaril [Thioridazine] Itching    Family History  Problem Relation Age of Onset   Coronary artery disease Other    Diabetes Other    Heart failure Mother    Heart failure Father    Colon cancer Neg Hx    Esophageal cancer Neg Hx    Stomach cancer Neg Hx    Rectal cancer Neg Hx     Prior to Admission medications   Medication Sig Start Date End Date Taking? Authorizing Provider  alendronate (FOSAMAX) 70 MG tablet Take 1 tablet (70 mg total) by mouth every Monday. 06/06/21  Yes Medina-Vargas, Monina C, NP  apixaban (ELIQUIS) 5 MG TABS tablet Take 5 mg by mouth 2 (two) times daily.   Yes [provider]  ascorbic acid (VITAMIN C) 500 MG tablet Take 500 mg by mouth daily.   Yes [provider]  aspirin EC 81 MG tablet Take 1 tablet (81 mg total) by mouth every other day. 06/03/21  Yes Medina-Vargas, Monina C, NP  benztropine (COGENTIN) 0.5 MG tablet Take 1 tablet (0.5 mg total) by mouth in the morning and at bedtime. 06/03/21  Yes  Medina-Vargas, Monina C, NP  fluticasone (FLONASE) 50 MCG/ACT nasal spray Place 2 sprays into both nostrils daily. 06/03/21  Yes Medina-Vargas, Monina C, NP  hydrochlorothiazide (MICROZIDE) 12.5 MG capsule Take 1 capsule (12.5 mg total) by mouth in the morning. 06/03/21  Yes Medina-Vargas, Monina C, NP  magnesium oxide (MAG-OX) 400 (240 Mg) MG tablet Take 400 mg by mouth daily.   Yes [provider]  metFORMIN (GLUCOPHAGE) 500 MG tablet Take 1 tablet (500 mg total) by mouth in the morning and at bedtime. 06/03/21  Yes Medina-Vargas, Monina C, NP  metoprolol succinate (TOPROL XL) 25 MG 24 hr tablet Take 1/2 tablet (12.5 mg total) by mouth daily. 12/13/22  Yes Weaver, Scott T, PA-C  Multiple Vitamin (MULTIVITAMIN WITH MINERALS) TABS tablet Take 1 tablet by mouth daily with breakfast.   Yes [provider]  NAMZARIC 28-10 MG CP24 Take 1 capsule by mouth in the morning. 06/03/21  Yes Medina-Vargas, Monina C,  NP  NON FORMULARY Diet:Heart Healthy/CCD/thin liquids   Yes [provider]  Nutritional Supplements (NUTRITIONAL DRINK) LIQD Take 1 Container by mouth daily. [employer]   Yes [provider]  Omega-3 1000 MG CAPS Take 1,000 mg by mouth every Monday, Wednesday, and Friday.   Yes [provider]  pantoprazole (PROTONIX) 40 MG tablet Take 1 tablet (40 mg total) by mouth daily. 06/03/21  Yes Medina-Vargas, Monina C, NP  Potassium Chloride ER 20 MEQ TBCR Take 20 mEq by mouth in the morning and at bedtime. 06/03/21  Yes Medina-Vargas, Monina C, NP  risperiDONE (RISPERDAL) 2 MG tablet Take 2 mg by mouth at bedtime. 09/22/22  Yes [provider]  rosuvastatin (CRESTOR) 40 MG tablet Take 1 tablet orally daily at 6 PM. 06/03/21  Yes Medina-Vargas, Monina C, NP  Zinc Oxide 10 % AERO Apply 1 application  topically 2 (two) times daily. Incontinent care.   Yes [provider]  acetaminophen (TYLENOL) 500 MG tablet Take 500 mg by mouth every 6 (six) hours as needed for  mild pain or headache. Patient not taking: Reported on 02/14/2023    [provider]  acetaminophen-codeine (TYLENOL #3) 300-30 MG tablet Take 1 tablet by mouth every 6 (six) hours as needed (tooth pain). Patient not taking: Reported on 02/14/2023    [provider]  bisacodyl (DULCOLAX) 10 MG suppository Place 10 mg rectally daily as needed for moderate constipation. Patient not taking: Reported on 02/14/2023    [provider]  calcium-vitamin D (OSCAL WITH D) 500-200 MG-UNIT tablet Take 1 tablet by mouth 3 (three) times daily. Patient not taking: Reported on 02/14/2023 02/25/15   Corwin Levins, MD  gabapentin (NEURONTIN) 100 MG capsule Take 2 capsules (200 mg total) by mouth 2 (two) times daily. Take 200 mg by mouth in the morning and 100 mg at bedtime Patient not taking: Reported on 02/14/2023 06/03/21   Medina-Vargas, Monina C, NP  ipratropium (ATROVENT) 0.06 % nasal spray USE 1 SPRAY IN EACH NOSTRIL THREE TIMES DAILY Patient not taking: Reported on 02/14/2023 06/03/21   Medina-Vargas, Monina C, NP  nitroGLYCERIN (NITROSTAT) 0.4 MG SL tablet DISSOLVE 1 TABLET UNDER THE TONGUE EVERY 5 MINUTES AS NEEDED FOR CHEST PAIN AS DIRECTED Patient not taking: Reported on 02/14/2023 06/03/21   Medina-Vargas, Margit Banda, NP    Physical Exam: Constitutional: Moderately built and nourished. Vitals:   02/14/23 2200 02/14/23 2237 02/14/23 2329 02/15/23 0200  BP: 125/86   (!) 90/55  Pulse: (!) 170   87  Resp: (!) 29   (!) 23  Temp:      TempSrc:      SpO2: 93%   100%  Weight:  57.8 kg 57.8 kg   Height:   4\' 11"  (1.499 m)    Eyes: Anicteric no pallor. ENMT: No discharge from the ears eyes nose or mouth. Neck: No mass felt.  No neck rigidity. Respiratory: No rhonchi or crepitations. Cardiovascular: S1-S2 heard. Abdomen: Soft nontender bowel sounds present. Musculoskeletal: No edema. Skin: No rash. Neurologic: Alert awake oriented to name following commands moving all  extremities. Psychiatric: Oriented to her name.   Labs on Admission: I have personally reviewed following labs and imaging studies  CBC: Recent Labs  Lab 02/14/23 2130  WBC 22.0*  NEUTROABS 19.3*  HGB 12.5  HCT 39.5  MCV 95.9  PLT 341   Basic Metabolic Panel: Recent Labs  Lab 02/14/23 2130  NA 144  K 3.4*  CL 104  CO2 23  GLUCOSE 197*  BUN 27*  CREATININE 0.95  CALCIUM 9.3   GFR: Estimated Creatinine Clearance: 38.4 mL/min (by C-G formula based on SCr of 0.95 mg/dL). Liver Function Tests: Recent Labs  Lab 02/14/23 2130  AST 21  ALT 18  ALKPHOS 61  BILITOT 1.1  PROT 6.9  ALBUMIN 3.1*   No results for input(s): "LIPASE", "AMYLASE" in the last 168 hours. No results for input(s): "AMMONIA" in the last 168 hours. Coagulation Profile: No results for input(s): "INR", "PROTIME" in the last 168 hours. Cardiac Enzymes: No results for input(s): "CKTOTAL", "CKMB", "CKMBINDEX", "TROPONINI" in the last 168 hours. BNP (last 3 results) No results for input(s): "PROBNP" in the last 8760 hours. HbA1C: No results for input(s): "HGBA1C" in the last 72 hours. CBG: No results for input(s): "GLUCAP" in the last 168 hours. Lipid Profile: No results for input(s): "CHOL", "HDL", "LDLCALC", "TRIG", "CHOLHDL", "LDLDIRECT" in the last 72 hours. Thyroid Function Tests: No results for input(s): "TSH", "T4TOTAL", "FREET4", "T3FREE", "THYROIDAB" in the last 72 hours. Anemia Panel: No results for input(s): "VITAMINB12", "FOLATE", "FERRITIN", "TIBC", "IRON", "RETICCTPCT" in the last 72 hours. Urine analysis:    Component Value Date/Time   COLORURINE AMBER (A) 02/14/2023 2241   APPEARANCEUR TURBID (A) 02/14/2023 2241   LABSPEC 1.018 02/14/2023 2241   PHURINE 5.0 02/14/2023 2241   GLUCOSEU NEGATIVE 02/14/2023 2241   GLUCOSEU NEGATIVE 06/15/2015 1349   HGBUR SMALL (A) 02/14/2023 2241   BILIRUBINUR NEGATIVE 02/14/2023 2241   KETONESUR 5 (A) 02/14/2023 2241   PROTEINUR 100 (A)  02/14/2023 2241   UROBILINOGEN 0.2 06/15/2015 1349   NITRITE NEGATIVE 02/14/2023 2241   LEUKOCYTESUR MODERATE (A) 02/14/2023 2241   Sepsis Labs: @LABRCNTIP (procalcitonin:4,lacticidven:4) ) Recent Results (from the past 240 hours)  Respiratory (~20 pathogens) panel by PCR     Status: Abnormal   Collection Time: 02/05/23 10:38 AM   Specimen: Nasopharyngeal Swab; Respiratory  Result Value Ref Range Status   Adenovirus NOT DETECTED NOT DETECTED Final   Coronavirus 229E NOT DETECTED NOT DETECTED Final    Comment: (NOTE) The Coronavirus on the Respiratory Panel, DOES NOT test for the novel  Coronavirus (2019 nCoV)    Coronavirus HKU1 NOT DETECTED NOT DETECTED Final   Coronavirus NL63 NOT DETECTED NOT DETECTED Final   Coronavirus OC43 DETECTED (A) NOT DETECTED Final   Metapneumovirus NOT DETECTED NOT DETECTED Final   Rhinovirus / Enterovirus NOT DETECTED NOT DETECTED Final   Influenza A NOT DETECTED NOT DETECTED Final   Influenza B NOT DETECTED NOT DETECTED Final   Parainfluenza Virus 1 NOT DETECTED NOT DETECTED Final   Parainfluenza Virus 2 NOT DETECTED NOT DETECTED Final   Parainfluenza Virus 3 NOT DETECTED NOT DETECTED Final   Parainfluenza Virus 4 NOT DETECTED NOT DETECTED Final   Respiratory Syncytial Virus NOT DETECTED NOT DETECTED Final   Bordetella pertussis NOT DETECTED NOT DETECTED Final   Bordetella Parapertussis NOT DETECTED NOT DETECTED Final   Chlamydophila pneumoniae NOT DETECTED NOT DETECTED Final   Mycoplasma pneumoniae NOT DETECTED NOT DETECTED Final    Comment: Performed at [facility], [address] [place], [place] [address]  Resp panel by RT-PCR (RSV, Flu A&B, Covid) Anterior Nasal Swab     Status: None   Collection Time: 02/14/23 10:41 PM   Specimen: Anterior Nasal Swab  Result Value Ref Range Status   SARS Coronavirus 2 by RT PCR NEGATIVE NEGATIVE Final   Influenza A by PCR NEGATIVE NEGATIVE Final   Influenza B by PCR NEGATIVE  NEGATIVE Final     Comment: (NOTE) The Xpert Xpress SARS-CoV-2/FLU/RSV plus assay is intended as an aid in the diagnosis of influenza from Nasopharyngeal swab specimens and should not be used as a sole basis for treatment. Nasal washings and aspirates are unacceptable for Xpert Xpress SARS-CoV-2/FLU/RSV testing.  Fact Sheet for Patients: BloggerCourse.com  Fact Sheet for Healthcare Providers: SeriousBroker.it  This test is not yet approved or cleared by the [place] FDA and has been authorized for detection and/or diagnosis of SARS-CoV-2 by FDA under an Emergency Use Authorization (EUA). This EUA will remain in effect (meaning this test can be used) for the duration of the COVID-19 declaration under Section 564(b)(1) of the Act, 21 U.S.C. section 360bbb-3(b)(1), unless the authorization is terminated or revoked.     Resp Syncytial Virus by PCR NEGATIVE NEGATIVE Final    Comment: (NOTE) Fact Sheet for Patients: BloggerCourse.com  Fact Sheet for Healthcare Providers: SeriousBroker.it  This test is not yet approved or cleared by the [place] FDA and has been authorized for detection and/or diagnosis of SARS-CoV-2 by FDA under an Emergency Use Authorization (EUA). This EUA will remain in effect (meaning this test can be used) for the duration of the COVID-19 declaration under Section 564(b)(1) of the Act, 21 U.S.C. section 360bbb-3(b)(1), unless the authorization is terminated or revoked.  Performed at [facility], [address], [place], [place] [address]   Urine Culture     Status: None (Preliminary result)   Collection Time: 02/14/23 10:41 PM   Specimen: Urine, Catheterized  Result Value Ref Range Status   Specimen Description URINE, CATHETERIZED  Final   Special Requests   Final    NONE Reflexed from [id number] Performed at [facility], [address] [place],  [place] [address]    Culture PENDING  Incomplete   Report Status PENDING  Incomplete     Radiological Exams on Admission: DG Chest Port 1 View Result Date: 02/14/2023 CLINICAL DATA:  Questionable sepsis - evaluate for abnormality EXAM: PORTABLE CHEST 1 VIEW COMPARISON:  02/04/2023 FINDINGS: Low lung volumes persist. Stable heart size and mediastinal contours. TAVR streaky opacity in the left greater than right lung base of increased from prior exam. No pulmonary edema. No pneumothorax or large pleural effusion. First right shoulder arthroplasty. Remote left rib fractures. IMPRESSION: Low lung volumes with streaky opacity in the left greater than right lung base, increased from prior exam, may represent atelectasis or pneumonia. Electronically Signed   By: Narda Rutherford M.D.   On: 02/14/2023 21:52    EKG: Independently reviewed.  Sinus tachycardia.  Assessment/Plan Active Problems:   Chronic diastolic CHF (congestive heart failure) (HCC)   Essential hypertension   Type 2 diabetes mellitus with complication (HCC)   S/P TAVR (transcatheter aortic valve replacement)   Coronary artery disease involving native coronary artery of native heart without angina pectoris   History of dementia   Sepsis (HCC)   CAP (community acquired pneumonia)    Sepsis with acute respiratory failure with hypoxia secondary to pneumonia for which patient has been started on empiric antibiotics.  Patient recently was COVID-positive about 10 days ago.  Follow-up cultures continue with hydration hold antihypertensives given the low normal blood pressure.  On admission patient was tachycardic hypotensive and had altered mental status with fever chest x-ray showing infiltrates consistent with pneumonia and sepsis. Diabetes mellitus type 2 takes metformin.  Will keep patient on sliding scale coverage.  Hemoglobin A1c pending. History of CAD status  post PCI continue Crestor patient is also on Eliquis.  Holding metoprolol due to low  normal blood pressure.  Troponins are pending. Acute metabolic encephalopathy secondary to sepsis improved.  CT head is pending. History of dementia with behavioral disturbances on Risperdal and Cogentin.  On Namenda. Hypertension hold metoprolol and hydrochlorothiazide due to low normal blood pressure and sepsis. History of TAVR.  Recent 2D echo done on February 05, 2023 showed no valvular leaks.  EF was 60 to 65%. GERD on PPI. History of atrial fibrillation takes metoprolol for heart rate control.  On hold due to low normal blood pressure.  Discussed with patient's sister about patient being on Eliquis and there was some plan to hold Eliquis due to low burden of A-fib but since patient on Eliquis at this time patient's sisters agree with continuing for now.  Has follow-up with cardiology in the near future.  Since patient has sepsis with respiratory failure from pneumonia will need close monitoring further workup more than 2 midnight stay in inpatient status.   DVT prophylaxis: Eliquis. Code Status: DNR confirmed with patient's sister. Family Communication: Patient's sister. Disposition Plan: Progressive care. Consults called: None. Admission status: Patient.

## 2023-02-15 NOTE — ED Notes (Signed)
ED TO INPATIENT HANDOFF REPORT  ED Nurse Name and Phone #: Osvaldo Shipper RN [phone]  S Name/Age/Gender Katherine [name] 78 y.o. female Room/Bed: 008C/008C  Code Status   Code Status: Limited: Do not attempt resuscitation (DNR) -DNR-LIMITED -Do Not Intubate/DNI   Home/SNF/Other SN[facility]- [facility] A[facility] Is this baseline? No      Chief Complaint Sepsis (HCC) [A41.9]  Triage Note Pt BIB EMS from [facility]. Pt dx with Covid 1/7. Pt found hypoxic by staff at 79% SpO2 on 2L nasal cannula. Per staff patient "more lethargic." Pt hot,and febrile with EMS. Baseline dementia, but facility staff states patient is normally and alert and communicative.    Allergies 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 PATIENT HAS HAD A PCN REACTION WITH IMMEDIATE RASH, FACIAL/TONGUE/THROAT SWELLING, SOB, OR LIGHTHEADEDNESS WITH HYPOTENSION:  #  #  #  YES  #  #  #   Has patient had a PCN reaction causing severe rash involving mucus membranes or skin necrosis: Unknown Has patient had a PCN reaction that required hospitalization: Unknown Has patient had a PCN reaction occurring within the last 10 years: Unknown    Euphorbia Other (See Comments)    Not listed on MAR   Coconut (Cocos Nucifera) Itching   Latex Rash   Mellaril [Thioridazine] Itching    Level of Care/Admitting Diagnosis ED Disposition     ED Disposition  Admit   Condition  --   Comment  Hospital Area: [facility] [[id number]]  Level of Care: Progressive [102]  Admit to Progressive based on following criteria: COMPLICATED UROLOGY Patients requiring frequent assessments and interventions, such as continuous bladder irrigations, immediate post-op surgical procedures, i.e., bladder removal/ileal conduit.  May admit patient to [facility] or [facility] if equivalent level of care is available:: No  Covid Evaluation: Confirmed COVID Negative  Diagnosis: Sepsis  [facility]) [[id number]]  Admitting Physician: Eduard Clos [[id number]]  Attending Physician: Eduard Clos [phone]  Certification:: I certify this patient will need inpatient services for at least 2 midnights          B Medical/Surgery History Past Medical History:  Diagnosis Date   ANXIETY 07/31/2007   Atrial fibrillation (HCC)    a. per structural notes, previous hx remote Afib.   CAD (coronary artery disease)    a. 03/2019 orbital atherectomy/PCI with DES to RCA, prox Cx, and mid Cx.   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   s/p TAVR with a 26 mm Edwards S3U via the TF approach by Drs Excell Seltzer and Cornelius Moras   Severe aortic stenosis    s/p TAVR   Unspecified hearing loss 10/09/2007   Past Surgical History:  Procedure Laterality Date   ABDOMINAL HYSTERECTOMY  12/08   Bladder tac, partial hysterectomy   BREAST BIOPSY     CARDIAC CATHETERIZATION     catarct removal     both eyes   COLONOSCOPY     CORONARY ATHERECTOMY N/A 04/15/2019   Procedure: CORONARY ATHERECTOMY;  Surgeon: [name], MD;  Location: [facility] INVASIVE CV LAB;  Service: Cardiovascular;  Laterality: N/A;   CORONARY STENT INTERVENTION N/A 04/15/2019  Procedure: CORONARY STENT INTERVENTION;  Surgeon: [name], MD;  Location: [facility] INVASIVE CV LAB;  Service: Cardiovascular;  Laterality: N/A;   I & D KNEE WITH POLY EXCHANGE Left 01/03/2013   Procedure: IRRIGATION AND DEBRIDEMENT LEFT KNEE WITH POLY EXCHANGE;  Surgeon: Kathryne Hitch, MD;  Location: [facility] ORS;  Service: Orthopedics;  Laterality: Left;   inguinal herniorrhapy     right Dr. Freida Busman 06/2009   REVERSE SHOULDER ARTHROPLASTY Right 09/07/2016    REVERSE SHOULDER ARTHROPLASTY Right 09/07/2016   Procedure: REVERSE SHOULDER ARTHROPLASTY;  Surgeon: Jones Broom, MD;  Location: [facility] OR;  Service: Orthopedics;  Laterality: Right;  Right reverse shoulder arthroplasty   RIGHT/LEFT HEART CATH AND CORONARY ANGIOGRAPHY N/A 09/11/2018   Procedure: RIGHT/LEFT HEART CATH AND CORONARY ANGIOGRAPHY;  Surgeon: [name], MD;  Location: [facility] INVASIVE CV LAB;  Service: Cardiovascular;  Laterality: N/A;   TEE WITHOUT CARDIOVERSION N/A 04/22/2019   Procedure: TRANSESOPHAGEAL ECHOCARDIOGRAM (TEE);  Surgeon: [name], MD;  Location: [facility] INVASIVE CV LAB;  Service: Open Heart Surgery;  Laterality: N/A;   TONSILLECTOMY     TOTAL KNEE ARTHROPLASTY Left 12/20/2012   Procedure: LEFT TOTAL KNEE ARTHROPLASTY;  Surgeon: Kathryne Hitch, MD;  Location: [facility];  Service: Orthopedics;  Laterality: Left;   TRANSCATHETER AORTIC VALVE REPLACEMENT, TRANSFEMORAL N/A 04/22/2019   Procedure: TRANSCATHETER AORTIC VALVE REPLACEMENT, TRANSFEMORAL;  Surgeon: [name], MD;  Location: [facility] INVASIVE CV LAB;  Service: Open Heart Surgery;  Laterality: N/A;     A IV Location/Drains/Wounds Patient Lines/Drains/Airways Status     Active Line/Drains/Airways     Name Placement date Placement time Site Days   Peripheral IV 02/14/23 20 G Anterior;Distal;Left Forearm 02/14/23  2153  Forearm  1   Peripheral IV 02/14/23 20 G Anterior;Right Wrist 02/14/23  2221  Wrist  1            Intake/Output Last 24 hours  Intake/Output Summary (Last 24 hours) at 02/15/2023 1634 Last data filed at 02/15/2023 1618 Gross per 24 hour  Intake 2067.32 ml  Output --  Net 2067.32 ml    Labs/Imaging Results for orders placed or performed during the hospital encounter of 02/14/23 (from the past 48 hours)  Comprehensive metabolic panel     Status: Abnormal   Collection Time: 02/14/23  9:30 PM  Result Value Ref Range   Sodium 144 135 - 145 mmol/L   Potassium 3.4 (L) 3.5 - 5.1  mmol/L   Chloride 104 98 - 111 mmol/L   CO2 23 22 - 32 mmol/L   Glucose, Bld 197 (H) 70 - 99 mg/dL    Comment: Glucose reference range applies only to samples taken after fasting for at least 8 hours.   BUN 27 (H) 8 - 23 mg/dL   Creatinine, Ser 1.61 0.44 - 1.00 mg/dL   Calcium 9.3 8.9 - 09.6 mg/dL   Total Protein 6.9 6.5 - 8.1 g/dL   Albumin 3.1 (L) 3.5 - 5.0 g/dL   AST 21 15 - 41 U/L   ALT 18 0 - 44 U/L   Alkaline Phosphatase 61 38 - 126 U/L   Total Bilirubin 1.1 0.0 - 1.2 mg/dL   GFR, Estimated >04 >54 mL/min    Comment: (NOTE) Calculated using the CKD-EPI Creatinine Equation (2021)    Anion gap 17 (H) 5 - 15    Comment: Performed at [facility], [address] [place] [address], [place], [place] [address]  CBC with Differential     Status: Abnormal   Collection Time:  02/14/23  9:30 PM  Result Value Ref Range   WBC 22.0 (H) 4.0 - 10.5 K/uL   RBC 4.12 3.87 - 5.11 MIL/uL   Hemoglobin 12.5 12.0 - 15.0 g/dL   HCT 40.9 81.1 - 91.4 %   MCV 95.9 80.0 - 100.0 fL   MCH 30.3 26.0 - 34.0 pg   MCHC 31.6 30.0 - 36.0 g/dL   RDW 78.2 95.6 - 21.3 %   Platelets 341 150 - 400 K/uL   nRBC 0.0 0.0 - 0.2 %   Neutrophils Relative % 87 %   Neutro Abs 19.3 (H) 1.7 - 7.7 K/uL   Lymphocytes Relative 6 %   Lymphs Abs 1.4 0.7 - 4.0 K/uL   Monocytes Relative 6 %   Monocytes Absolute 1.2 (H) 0.1 - 1.0 K/uL   Eosinophils Relative 0 %   Eosinophils Absolute 0.0 0.0 - 0.5 K/uL   Basophils Relative 0 %   Basophils Absolute 0.0 0.0 - 0.1 K/uL   Immature Granulocytes 1 %   Abs Immature Granulocytes 0.14 (H) 0.00 - 0.07 K/uL    Comment: Performed at [facility], [address] [place], [place] [address]  Blood Culture (routine x 2)     Status: None (Preliminary result)   Collection Time: 02/14/23  9:33 PM   Specimen: BLOOD  Result Value Ref Range   Specimen Description BLOOD LEFT ANTECUBITAL    Special Requests      BOTTLES DRAWN AEROBIC AND ANAEROBIC Blood Culture results may not be optimal due to an  inadequate volume of blood received in culture bottles   Culture  Setup Time      GRAM POSITIVE COCCI IN CLUSTERS AEROBIC BOTTLE ONLY CRITICAL RESULT CALLED TO, READ BACK BY AND VERIFIED WITH: PHARMD CAREN AMEND ON 02/15/23 @ 1652 BY DRT Performed at [facility], [address] [place], [place] [address]    Culture GRAM POSITIVE COCCI    Report Status PENDING   Blood Culture ID Panel (Reflexed)     Status: Abnormal   Collection Time: 02/14/23  9:33 PM  Result Value Ref Range   Enterococcus faecalis NOT DETECTED NOT DETECTED   Enterococcus Faecium NOT DETECTED NOT DETECTED   Listeria monocytogenes NOT DETECTED NOT DETECTED   Staphylococcus species DETECTED (A) NOT DETECTED    Comment: CRITICAL RESULT CALLED TO, READ BACK BY AND VERIFIED WITH: PHARMD CAREN AMEND ON 02/15/23 @ 1652 BY DRT    Staphylococcus aureus (BCID) NOT DETECTED NOT DETECTED   Staphylococcus epidermidis DETECTED (A) NOT DETECTED    Comment: Methicillin (oxacillin) resistant coagulase negative staphylococcus. Possible blood culture contaminant (unless isolated from more than one blood culture draw or clinical case suggests pathogenicity). No antibiotic treatment is indicated for blood  culture contaminants. CRITICAL RESULT CALLED TO, READ BACK BY AND VERIFIED WITH: PHARMD CAREN AMEND ON 02/15/23 @ 1652 BY DRT    Staphylococcus lugdunensis NOT DETECTED NOT DETECTED   Streptococcus species NOT DETECTED NOT DETECTED   Streptococcus agalactiae NOT DETECTED NOT DETECTED   Streptococcus pneumoniae NOT DETECTED NOT DETECTED   Streptococcus pyogenes NOT DETECTED NOT DETECTED   A.calcoaceticus-baumannii NOT DETECTED NOT DETECTED   Bacteroides fragilis NOT DETECTED NOT DETECTED   Enterobacterales NOT DETECTED NOT DETECTED   Enterobacter cloacae complex NOT DETECTED NOT DETECTED   Escherichia coli NOT DETECTED NOT DETECTED   Klebsiella aerogenes NOT DETECTED NOT DETECTED   Klebsiella oxytoca NOT DETECTED NOT DETECTED    Klebsiella pneumoniae NOT DETECTED NOT DETECTED  Proteus species NOT DETECTED NOT DETECTED   Salmonella species NOT DETECTED NOT DETECTED   Serratia marcescens NOT DETECTED NOT DETECTED   Haemophilus influenzae NOT DETECTED NOT DETECTED   Neisseria meningitidis NOT DETECTED NOT DETECTED   Pseudomonas aeruginosa NOT DETECTED NOT DETECTED   Stenotrophomonas maltophilia NOT DETECTED NOT DETECTED   Candida albicans NOT DETECTED NOT DETECTED   Candida auris NOT DETECTED NOT DETECTED   Candida glabrata NOT DETECTED NOT DETECTED   Candida krusei NOT DETECTED NOT DETECTED   Candida parapsilosis NOT DETECTED NOT DETECTED   Candida tropicalis NOT DETECTED NOT DETECTED   Cryptococcus neoformans/gattii NOT DETECTED NOT DETECTED   Methicillin resistance mecA/C DETECTED (A) NOT DETECTED    Comment: CRITICAL RESULT CALLED TO, READ BACK BY AND VERIFIED WITH: PHARMD CAREN AMEND ON 02/15/23 @ 1652 BY DRT Performed at [facility], [address] [place], [place] [address]   I-Stat Lactic Acid, ED     Status: None   Collection Time: 02/14/23 10:25 PM  Result Value Ref Range   Lactic Acid, Venous 1.6 0.5 - 1.9 mmol/L  Resp panel by RT-PCR (RSV, Flu A&B, Covid) Anterior Nasal Swab     Status: None   Collection Time: 02/14/23 10:41 PM   Specimen: Anterior Nasal Swab  Result Value Ref Range   SARS Coronavirus 2 by RT PCR NEGATIVE NEGATIVE   Influenza A by PCR NEGATIVE NEGATIVE   Influenza B by PCR NEGATIVE NEGATIVE    Comment: (NOTE) The Xpert Xpress SARS-CoV-2/FLU/RSV plus assay is intended as an aid in the diagnosis of influenza from Nasopharyngeal swab specimens and should not be used as a sole basis for treatment. Nasal washings and aspirates are unacceptable for Xpert Xpress SARS-CoV-2/FLU/RSV testing.  Fact Sheet for Patients: BloggerCourse.com  Fact Sheet for Healthcare Providers: SeriousBroker.it  This test is not yet approved or  cleared by the [place] FDA and has been authorized for detection and/or diagnosis of SARS-CoV-2 by FDA under an Emergency Use Authorization (EUA). This EUA will remain in effect (meaning this test can be used) for the duration of the COVID-19 declaration under Section 564(b)(1) of the Act, 21 U.S.C. section 360bbb-3(b)(1), unless the authorization is terminated or revoked.     Resp Syncytial Virus by PCR NEGATIVE NEGATIVE    Comment: (NOTE) Fact Sheet for Patients: BloggerCourse.com  Fact Sheet for Healthcare Providers: SeriousBroker.it  This test is not yet approved or cleared by the [place] FDA and has been authorized for detection and/or diagnosis of SARS-CoV-2 by FDA under an Emergency Use Authorization (EUA). This EUA will remain in effect (meaning this test can be used) for the duration of the COVID-19 declaration under Section 564(b)(1) of the Act, 21 U.S.C. section 360bbb-3(b)(1), unless the authorization is terminated or revoked.  Performed at [facility], [address], [place], [place] [address]   Urinalysis, w/ Reflex to Culture (Infection Suspected) -Urine, Catheterized     Status: Abnormal   Collection Time: 02/14/23 10:41 PM  Result Value Ref Range   Specimen Source URINE, CATHETERIZED    Color, Urine AMBER (A) YELLOW    Comment: BIOCHEMICALS MAY BE AFFECTED BY COLOR   APPearance TURBID (A) CLEAR   Specific Gravity, Urine 1.018 1.005 - 1.030   pH 5.0 5.0 - 8.0   Glucose, UA NEGATIVE NEGATIVE mg/dL   Hgb urine dipstick SMALL (A) NEGATIVE   Bilirubin Urine NEGATIVE NEGATIVE   Ketones, ur 5 (A) NEGATIVE mg/dL   Protein, ur 734 (A)  NEGATIVE mg/dL   Nitrite NEGATIVE NEGATIVE   Leukocytes,Ua MODERATE (A) NEGATIVE   RBC / HPF 21-50 0 - 5 RBC/hpf   WBC, UA >50 0 - 5 WBC/hpf    Comment:        Reflex urine culture not performed if WBC <=10, OR if Squamous epithelial cells >5. If Squamous  epithelial cells >5 suggest recollection.    Bacteria, UA MANY (A) NONE SEEN   Squamous Epithelial / HPF 0-5 0 - 5 /HPF   WBC Clumps PRESENT    Mucus PRESENT    Hyaline Casts, UA PRESENT    Non Squamous Epithelial 0-5 (A) NONE SEEN    Comment: Performed at [facility], [address], [place], [place] [address]  Urine Culture     Status: None (Preliminary result)   Collection Time: 02/14/23 10:41 PM   Specimen: Urine, Catheterized  Result Value Ref Range   Specimen Description URINE, CATHETERIZED    Special Requests      NONE Reflexed from [phone] Performed at [facility], [address], [place], [place] [address]    Culture PENDING    Report Status PENDING   Blood Culture (routine x 2)     Status: None (Preliminary result)   Collection Time: 02/14/23 10:43 PM   Specimen: BLOOD  Result Value Ref Range   Specimen Description BLOOD SITE NOT SPECIFIED    Special Requests      BOTTLES DRAWN AEROBIC AND ANAEROBIC Blood Culture adequate volume   Culture      NO GROWTH < 12 HOURS Performed at [facility], [address], [place], [place] [address]    Report Status PENDING   Hemoglobin A1c     Status: Abnormal   Collection Time: 02/15/23  4:38 AM  Result Value Ref Range   Hgb A1c MFr Bld 5.9 (H) 4.8 - 5.6 %    Comment: (NOTE) Pre diabetes:          5.7%-6.4%  Diabetes:              >6.4%  Glycemic control for   <7.0% adults with diabetes    Mean Plasma Glucose 122.63 mg/dL    Comment: Performed at [name], [address], [place], [place] [address]  Basic metabolic panel     Status: Abnormal   Collection Time: 02/15/23  4:38 AM  Result Value Ref Range   Sodium 146 (H) 135 - 145 mmol/L   Potassium 2.9 (L) 3.5 - 5.1 mmol/L   Chloride 109 98 - 111 mmol/L   CO2 23 22 - 32 mmol/L   Glucose, Bld 147 (H) 70 - 99 mg/dL    Comment: Glucose reference range applies only to samples taken after fasting for at least 8 hours.   BUN 27 (H) 8 - 23 mg/dL    Creatinine, Ser 4.13 0.44 - 1.00 mg/dL   Calcium 8.4 (L) 8.9 - 10.3 mg/dL   GFR, Estimated >24 >40 mL/min    Comment: (NOTE) Calculated using the CKD-EPI Creatinine Equation (2021)    Anion gap 14 5 - 15    Comment: Performed at [facility], [address], [place], [place] [address]  CBC     Status: Abnormal   Collection Time: 02/15/23  4:38 AM  Result Value Ref Range   WBC 20.8 (H) 4.0 - 10.5 K/uL   RBC 3.67 (L) 3.87 - 5.11 MIL/uL   Hemoglobin 11.2 (L) 12.0 - 15.0  g/dL   HCT 95.6 (L) 21.3 - 08.6 %   MCV 96.5 80.0 - 100.0 fL   MCH 30.5 26.0 - 34.0 pg   MCHC 31.6 30.0 - 36.0 g/dL   RDW 57.8 46.9 - 62.9 %   Platelets 273 150 - 400 K/uL   nRBC 0.0 0.0 - 0.2 %    Comment: Performed at [facility], [address] [place], [place] [address]  Protime-INR     Status: Abnormal   Collection Time: 02/15/23  4:38 AM  Result Value Ref Range   Prothrombin Time 18.9 (H) 11.4 - 15.2 seconds   INR 1.6 (H) 0.8 - 1.2    Comment: (NOTE) INR goal varies based on device and disease states. Performed at [facility], [address] [place], [place] [address]   TSH     Status: Abnormal   Collection Time: 02/15/23  4:52 AM  Result Value Ref Range   TSH 0.321 (L) 0.350 - 4.500 uIU/mL    Comment: Performed by a 3rd Generation assay with a functional sensitivity of <=0.01 uIU/mL. Performed at [facility], [address], [place], [place] [address]   Troponin I (High Sensitivity)     Status: Abnormal   Collection Time: 02/15/23  6:57 AM  Result Value Ref Range   Troponin I (High Sensitivity) 24 (H) <18 ng/L    Comment: (NOTE) Elevated high sensitivity troponin I (hsTnI) values and significant  changes across serial measurements may suggest ACS but many other  chronic and acute conditions are known to elevate hsTnI results.  Refer to the "Links" section for chest pain algorithms and additional  guidance. Performed at [facility], [address],  [place], [place] [address]   Magnesium     Status: Abnormal   Collection Time: 02/15/23  6:57 AM  Result Value Ref Range   Magnesium 1.5 (L) 1.7 - 2.4 mg/dL    Comment: Performed at [facility], [address], [place], [place] [address]  CBG monitoring, ED     Status: Abnormal   Collection Time: 02/15/23  9:00 AM  Result Value Ref Range   Glucose-Capillary 136 (H) 70 - 99 mg/dL    Comment: Glucose reference range applies only to samples taken after fasting for at least 8 hours.  CBG monitoring, ED     Status: Abnormal   Collection Time: 02/15/23 11:37 AM  Result Value Ref Range   Glucose-Capillary 116 (H) 70 - 99 mg/dL    Comment: Glucose reference range applies only to samples taken after fasting for at least 8 hours.   CT HEAD WO CONTRAST ( ) Result Date: 02/15/2023 CLINICAL DATA:  Hypoxia and lethargy in the setting of COVID. Mental status change with unknown cause. EXAM: CT HEAD WITHOUT CONTRAST TECHNIQUE: Contiguous axial images were obtained from the base of the skull through the vertex without intravenous contrast. RADIATION DOSE REDUCTION: This exam was performed according to the departmental dose-optimization program which includes automated exposure control, adjustment of the mA and/or kV according to patient size and/or use of iterative reconstruction technique. COMPARISON:  Brain MRI 02/05/2023 FINDINGS: Brain: No evidence of acute infarction, hemorrhage, hydrocephalus, extra-axial collection or mass lesion/mass effect. Bands of streak artifact best seen on reformats. Mild chronic small vessel disease in the cerebral white matter for age. Vascular: No hyperdense vessel or unexpected calcification. Skull: Normal. Negative for fracture or focal lesion. Sinuses/Orbits: No acute finding. IMPRESSION: Aging brain without acute or interval  finding. Electronically Signed   By: Tiburcio Pea M.D.   On: 02/15/2023 05:31   DG Chest Port 1 View Result Date: 02/14/2023 CLINICAL DATA:   Questionable sepsis - evaluate for abnormality EXAM: PORTABLE CHEST 1 VIEW COMPARISON:  02/04/2023 FINDINGS: Low lung volumes persist. Stable heart size and mediastinal contours. TAVR streaky opacity in the left greater than right lung base of increased from prior exam. No pulmonary edema. No pneumothorax or large pleural effusion. First right shoulder arthroplasty. Remote left rib fractures. IMPRESSION: Low lung volumes with streaky opacity in the left greater than right lung base, increased from prior exam, may represent atelectasis or pneumonia. Electronically Signed   By: Narda Rutherford M.D.   On: 02/14/2023 21:52    Pending Labs Unresulted Labs (From admission, onward)     Start     Ordered   02/16/23 0500  CBC  Tomorrow morning,   R        02/15/23 0830   02/16/23 0500  Basic metabolic panel  Tomorrow morning,   R        02/15/23 0830   02/15/23 1500  Potassium  Once,   R        02/15/23 0830   02/15/23 0352  MRSA Next Gen by PCR, Nasal  (MRSA Screening)  Once,   R        02/15/23 0352   02/14/23 2140  Protime-INR  Once,   STAT        02/14/23 2140   02/14/23 2140  APTT  Once,   STAT        02/14/23 2140            Vitals/Pain Today's Vitals   02/15/23 1030 02/15/23 1130 02/15/23 1410 02/15/23 1430  BP: 125/75 118/81  (!) 125/98  Pulse: 85 79 72 75  Resp: (!) 25 (!) 26 (!) 23 (!) 21  Temp:      TempSrc:      SpO2: 100% 99% 100% 99%  Weight:      Height:      PainSc:        Isolation Precautions No active isolations  Medications Medications  risperiDONE (RISPERDAL) tablet 2 mg (has no administration in time range)  rosuvastatin (CRESTOR) tablet 40 mg (has no administration in time range)  pantoprazole (PROTONIX) EC tablet 40 mg (40 mg Oral Not Given 02/15/23 0950)  apixaban (ELIQUIS) tablet 5 mg (5 mg Oral Not Given 02/15/23 0950)  benztropine (COGENTIN) tablet 0.5 mg (0.5 mg Oral Not Given 02/15/23 0950)  magnesium oxide (MAG-OX) tablet 400 mg (400 mg Oral Not  Given 02/15/23 0950)  Zinc Oxide 10 % AERO 1 application  (1 application  Apply externally Not Given 02/15/23 1003)  insulin aspart (novoLOG) injection 0-9 Units ( Subcutaneous Not Given 02/15/23 1206)  acetaminophen (TYLENOL) tablet 650 mg ( Oral See Alternative 02/15/23 1138)    Or  acetaminophen (TYLENOL) suppository 650 mg (650 mg Rectal Given 02/15/23 1138)  azithromycin (ZITHROMAX) 500 mg in sodium chloride 0.9 % 250 mL IVPB (0 mg Intravenous Stopped 02/15/23 0714)  ceFEPIme (MAXIPIME) 2 g in sodium chloride 0.9 % 100 mL IVPB (0 g Intravenous Stopped 02/15/23 1100)  vancomycin (VANCOREADY) IVPB 1250 mg/250 mL (has no administration in time range)  potassium chloride SA (KLOR-CON M) CR tablet 60 mEq (60 mEq Oral Not Given 02/15/23 0951)  memantine (NAMENDA XR) 24 hr capsule 28 mg (28 mg Oral Given 02/15/23 1515)    And  donepezil (ARICEPT) tablet  10 mg (10 mg Oral Given 02/15/23 1515)  0.9 %  sodium chloride infusion ( Intravenous New Bag/Given 02/15/23 1518)  sodium chloride 0.9 % bolus 1,000 mL (0 mLs Intravenous Stopped 02/14/23 2257)  metroNIDAZOLE (FLAGYL) IVPB 500 mg (0 mg Intravenous Stopped 02/14/23 2258)  vancomycin (VANCOCIN) IVPB 1000 mg/200 mL premix (0 mg Intravenous Stopped 02/14/23 2328)  ceFEPIme (MAXIPIME) 2 g in sodium chloride 0.9 % 100 mL IVPB (0 g Intravenous Stopped 02/14/23 2235)  acetaminophen (TYLENOL) suppository 650 mg (650 mg Rectal Given 02/14/23 2245)  potassium chloride (KLOR-CON) packet 20 mEq (20 mEq Oral Given 02/15/23 0600)  magnesium sulfate IVPB 2 g 50 mL (0 g Intravenous Stopped 02/15/23 1618)    Mobility non-ambulatory     Focused Assessments Pulmonary Assessment Handoff:  Lung sounds: Bilateral Breath Sounds: Diminished O2 Device: Room Air O2 Flow Rate (L/min): 3 L/min    R Recommendations: See Admitting Provider Note  Report given to:   Additional Notes: PNA

## 2023-02-15 NOTE — Evaluation (Signed)
Clinical/Bedside Swallow Evaluation Patient Details  Name: Katherine [name] MRN: [id number] Date of Birth: February 27, 1945  Today's Date: 02/15/2023 Time: SLP Start Time (ACUTE ONLY): 1106 SLP Stop Time (ACUTE ONLY): 1117 SLP Time Calculation (min) (ACUTE ONLY): 11 min  Past Medical History:  Past Medical History:  Diagnosis Date   ANXIETY 07/31/2007   Atrial fibrillation (HCC)    a. per structural notes, previous hx remote Afib.   CAD (coronary artery disease)    a. 03/2019 orbital atherectomy/PCI with DES to RCA, prox Cx, and mid Cx.   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   s/p TAVR with a 26 mm Edwards S3U via the TF approach by Drs Excell Seltzer and Cornelius Moras   Severe aortic stenosis    s/p TAVR   Unspecified hearing loss 10/09/2007   Past Surgical History:  Past Surgical History:  Procedure Laterality Date   ABDOMINAL HYSTERECTOMY  12/08   Bladder tac, partial hysterectomy   BREAST BIOPSY     CARDIAC CATHETERIZATION     catarct removal     both eyes   COLONOSCOPY     CORONARY ATHERECTOMY N/A 04/15/2019   Procedure: CORONARY ATHERECTOMY;  Surgeon: [name], MD;  Location: [facility] INVASIVE CV LAB;  Service: Cardiovascular;  Laterality: N/A;   CORONARY STENT INTERVENTION N/A 04/15/2019   Procedure: CORONARY STENT INTERVENTION;  Surgeon: [name], MD;  Location: [facility] INVASIVE CV LAB;  Service: Cardiovascular;  Laterality: N/A;   I & D KNEE WITH POLY EXCHANGE Left 01/03/2013   Procedure: IRRIGATION AND DEBRIDEMENT LEFT KNEE WITH POLY EXCHANGE;  Surgeon: Kathryne Hitch, MD;  Location: [facility] ORS;  Service: Orthopedics;  Laterality: Left;   inguinal herniorrhapy     right  Dr. Freida Busman 06/2009   REVERSE SHOULDER ARTHROPLASTY Right 09/07/2016   REVERSE SHOULDER ARTHROPLASTY Right 09/07/2016   Procedure: REVERSE SHOULDER ARTHROPLASTY;  Surgeon: Jones Broom, MD;  Location: [facility] OR;  Service: Orthopedics;  Laterality: Right;  Right reverse shoulder arthroplasty   RIGHT/LEFT HEART CATH AND CORONARY ANGIOGRAPHY N/A 09/11/2018   Procedure: RIGHT/LEFT HEART CATH AND CORONARY ANGIOGRAPHY;  Surgeon: [name], MD;  Location: [facility] INVASIVE CV LAB;  Service: Cardiovascular;  Laterality: N/A;   TEE WITHOUT CARDIOVERSION N/A 04/22/2019   Procedure: TRANSESOPHAGEAL ECHOCARDIOGRAM (TEE);  Surgeon: [name], MD;  Location: [facility] INVASIVE CV LAB;  Service: Open Heart Surgery;  Laterality: N/A;   TONSILLECTOMY     TOTAL KNEE ARTHROPLASTY Left 12/20/2012   Procedure: LEFT TOTAL KNEE ARTHROPLASTY;  Surgeon: Kathryne Hitch, MD;  Location: [facility] ORS;  Service: Orthopedics;  Laterality: Left;   TRANSCATHETER AORTIC VALVE REPLACEMENT, TRANSFEMORAL N/A 04/22/2019   Procedure: TRANSCATHETER AORTIC VALVE REPLACEMENT, TRANSFEMORAL;  Surgeon: [name], MD;  Location: [facility] INVASIVE CV LAB;  Service: Open Heart Surgery;  Laterality: N/A;   HPI:  Katherine [name] is a 78 y.o. female with history of CAD status post PCI, diabetes mellitus type 2, status post TAVR, dementia with behavioral disturbances, GERD who was recently admitted on February 05, 2023 for acute delirium in the setting of COVID infection was noticed to be having increasing difficulty breathing and fever with cough and altered mental status which has gradually worsened over the  last 48 hours.  EMS on arrival found that patient was hypoxic requiring 2 L oxygen and patient was altered.    Assessment / Plan / Recommendation  Clinical Impression   Pt presents with a mild oral dysphagia and possible pharyngeal dysphagia per clinical swallow assessment completed today. Oral prep and transit of softened cracker was mildly  prolonged. Pt able to completely clear bolus with additional time. Pharyngeal swallow initiation appeared prompt with laryngeal elevation noted. Observed delayed cough x1 instance following thin liquids. Unable to discern if cough was related to PO intake at bedside. No other overt or subtle s/s of aspiration were observed.   Recommend diet downgrade to D3 (mechanical soft) with continuation of thin liquids as tolerated. PO meds whole in applesauce.   Plan: SLP will follow up to assess diet tolerance and complete instrumental swallow assessment if concerns for aspiration are observed with PO intake.   SLP Visit Diagnosis: Dysphagia, oral phase (R13.11)    Aspiration Risk  Mild aspiration risk    Diet Recommendation Dysphagia 3 (Mech soft);Thin liquid    Liquid Administration via: Straw;Cup Medication Administration: Whole meds with puree Supervision: Full supervision/cueing for compensatory strategies;Staff to assist with self feeding Compensations: Slow rate;Small sips/bites Postural Changes: Seated upright at 90 degrees    Other  Recommendations Oral Care Recommendations: Oral care BID    Recommendations for follow up therapy are one component of a multi-disciplinary discharge planning process, led by the attending physician.  Recommendations may be updated based on patient status, additional functional criteria and insurance authorization.  Follow up Recommendations Follow physician's recommendations for discharge plan and follow up therapies      Assistance Recommended at Discharge  TBD; anticipate full supervision/assistance needed.   Functional Status Assessment Patient has had a recent decline in their functional status and demonstrates the ability to make significant improvements in function in a reasonable and predictable amount of time.  Frequency and Duration min 1 x/week  1 week       Prognosis Prognosis for improved oropharyngeal function: Fair Barriers to Reach Goals:  Cognitive deficits      Swallow Study   General Date of Onset: 02/15/23 HPI: Katherine [name] is a 78 y.o. female with history of CAD status post PCI, diabetes mellitus type 2, status post TAVR, dementia with behavioral disturbances, GERD who was recently admitted on February 05, 2023 for acute delirium in the setting of COVID infection was noticed to be having increasing difficulty breathing and fever with cough and altered mental status which has gradually worsened over the last 48 hours.  EMS on arrival found that patient was hypoxic requiring 2 L oxygen and patient was altered. Type of Study: Bedside Swallow Evaluation Previous Swallow Assessment: No prior swallow assessment per EMR Diet Prior to this Study: Regular;Thin liquids (Level 0) Temperature Spikes Noted: Yes Respiratory Status: Nasal cannula (3L) History of Recent Intubation: No Behavior/Cognition: Alert;Requires cueing Oral Cavity Assessment: Within Functional Limits Oral Cavity - Dentition: Missing dentition Self-Feeding Abilities: Total assist Patient Positioning: Upright in bed Baseline Vocal Quality: Not observed Volitional Cough: Cognitively unable to elicit    Oral/Motor/Sensory Function Overall Oral Motor/Sensory Function: Within functional limits   Ice Chips Ice chips: Not tested   Thin Liquid Thin Liquid: Within functional limits Presentation: Straw    Nectar Thick Nectar Thick Liquid: Not tested   Honey Thick Honey Thick Liquid: Not tested   Puree Puree: Within functional limits   Solid     Solid: Impaired Oral  Phase Functional Implications: Prolonged oral transit      Ellery Plunk 02/15/2023,11:30 AM

## 2023-02-15 NOTE — ED Notes (Signed)
PT well appearing upon transport up stairs with transport RN.

## 2023-02-15 NOTE — Progress Notes (Signed)
PHARMACY - PHYSICIAN COMMUNICATION CRITICAL VALUE ALERT - BLOOD CULTURE IDENTIFICATION (BCID)  Katherine [name] is an 78 y.o. female who presented to [facility] on 02/14/2023 with a chief complaint of sepsis secondary to PNA.  Assessment:  1/4 blood cx bottles with MRSE - could be contaminant  Name of physician (or Provider) Contacted: Dr Renford Dills  Current antibiotics: Vancomycin and Cefepim  Changes to prescribed antibiotics recommended:  Pt on Vanc/Cefepime currently so will continue same for now as Vanc covers MRSE. F/u ability to narrow based on further culture results.  Results for orders placed or performed during the hospital encounter of 02/14/23  Blood Culture ID Panel (Reflexed) (Collected: 02/14/2023  9:33 PM)  Result Value Ref Range   Enterococcus faecalis NOT DETECTED NOT DETECTED   Enterococcus Faecium NOT DETECTED NOT DETECTED   Listeria monocytogenes NOT DETECTED NOT DETECTED   Staphylococcus species DETECTED (A) NOT DETECTED   Staphylococcus aureus (BCID) NOT DETECTED NOT DETECTED   Staphylococcus epidermidis DETECTED (A) NOT DETECTED   Staphylococcus lugdunensis NOT DETECTED NOT DETECTED   Streptococcus species NOT DETECTED NOT DETECTED   Streptococcus agalactiae NOT DETECTED NOT DETECTED   Streptococcus pneumoniae NOT DETECTED NOT DETECTED   Streptococcus pyogenes NOT DETECTED NOT DETECTED   A.calcoaceticus-baumannii NOT DETECTED NOT DETECTED   Bacteroides fragilis NOT DETECTED NOT DETECTED   Enterobacterales NOT DETECTED NOT DETECTED   Enterobacter cloacae complex NOT DETECTED NOT DETECTED   Escherichia coli NOT DETECTED NOT DETECTED   Klebsiella aerogenes NOT DETECTED NOT DETECTED   Klebsiella oxytoca NOT DETECTED NOT DETECTED   Klebsiella pneumoniae NOT DETECTED NOT DETECTED   Proteus species NOT DETECTED NOT DETECTED   Salmonella species NOT DETECTED NOT DETECTED   Serratia marcescens NOT DETECTED NOT DETECTED   Haemophilus influenzae NOT DETECTED NOT  DETECTED   Neisseria meningitidis NOT DETECTED NOT DETECTED   Pseudomonas aeruginosa NOT DETECTED NOT DETECTED   Stenotrophomonas maltophilia NOT DETECTED NOT DETECTED   Candida albicans NOT DETECTED NOT DETECTED   Candida auris NOT DETECTED NOT DETECTED   Candida glabrata NOT DETECTED NOT DETECTED   Candida krusei NOT DETECTED NOT DETECTED   Candida parapsilosis NOT DETECTED NOT DETECTED   Candida tropicalis NOT DETECTED NOT DETECTED   Cryptococcus neoformans/gattii NOT DETECTED NOT DETECTED   Methicillin resistance mecA/C DETECTED (A) NOT DETECTED    Christoper Fabian, PharmD, BCPS Please see amion for complete clinical pharmacist phone list 02/15/2023  3:56 PM

## 2023-02-16 ENCOUNTER — Inpatient Hospital Stay (HOSPITAL_COMMUNITY): Payer: Medicare Other

## 2023-02-16 DIAGNOSIS — A419 Sepsis, unspecified organism: Secondary | ICD-10-CM | POA: Diagnosis not present

## 2023-02-16 LAB — COMPREHENSIVE METABOLIC PANEL
ALT: 17 U/L (ref 0–44)
AST: 20 U/L (ref 15–41)
Albumin: 2.3 g/dL — ABNORMAL LOW (ref 3.5–5.0)
Alkaline Phosphatase: 52 U/L (ref 38–126)
Anion gap: 12 (ref 5–15)
BUN: 21 mg/dL (ref 8–23)
CO2: 22 mmol/L (ref 22–32)
Calcium: 8.1 mg/dL — ABNORMAL LOW (ref 8.9–10.3)
Chloride: 114 mmol/L — ABNORMAL HIGH (ref 98–111)
Creatinine, Ser: 0.6 mg/dL (ref 0.44–1.00)
GFR, Estimated: >60 mL/min (ref >60)
Glucose, Bld: 126 mg/dL — ABNORMAL HIGH (ref 70–99)
Potassium: 3.4 mmol/L — ABNORMAL LOW (ref 3.5–5.1)
Sodium: 148 mmol/L — ABNORMAL HIGH (ref 135–145)
Total Bilirubin: 0.8 mg/dL (ref 0.0–1.2)
Total Protein: 5.7 g/dL — ABNORMAL LOW (ref 6.5–8.1)

## 2023-02-16 LAB — CBC
HCT: 31.6 % — ABNORMAL LOW (ref 36.0–46.0)
Hemoglobin: 9.9 g/dL — ABNORMAL LOW (ref 12.0–15.0)
MCH: 30.2 pg (ref 26.0–34.0)
MCHC: 31.3 g/dL (ref 30.0–36.0)
MCV: 96.3 fL (ref 80.0–100.0)
Platelets: 259 10*3/uL (ref 150–400)
RBC: 3.28 MIL/uL — ABNORMAL LOW (ref 3.87–5.11)
RDW: 13.9 % (ref 11.5–15.5)
WBC: 15.4 10*3/uL — ABNORMAL HIGH (ref 4.0–10.5)
nRBC: 0 % (ref 0.0–0.2)

## 2023-02-16 LAB — PROCALCITONIN: Procalcitonin: 0.34 ng/mL

## 2023-02-16 LAB — GLUCOSE, CAPILLARY
Glucose-Capillary: 110 mg/dL — ABNORMAL HIGH (ref 70–99)
Glucose-Capillary: 98 mg/dL (ref 70–99)
Glucose-Capillary: 130 mg/dL — ABNORMAL HIGH (ref 70–99)
Glucose-Capillary: 135 mg/dL — ABNORMAL HIGH (ref 70–99)

## 2023-02-16 LAB — MAGNESIUM: Magnesium: 2 mg/dL (ref 1.7–2.4)

## 2023-02-16 LAB — PHOSPHORUS: Phosphorus: 1.7 mg/dL — ABNORMAL LOW (ref 2.5–4.6)

## 2023-02-16 LAB — T4, FREE: Free T4: 1.71 ng/dL — ABNORMAL HIGH (ref 0.61–1.12)

## 2023-02-16 LAB — AMMONIA: Ammonia: 15 umol/L (ref 9–35)

## 2023-02-16 LAB — TSH: TSH: 0.311 u[IU]/mL — ABNORMAL LOW (ref 0.350–4.500)

## 2023-02-16 LAB — C-REACTIVE PROTEIN: CRP: 18 mg/dL — ABNORMAL HIGH (ref <1.0)

## 2023-02-16 LAB — BRAIN NATRIURETIC PEPTIDE: B Natriuretic Peptide: 548.5 pg/mL — ABNORMAL HIGH (ref 0.0–100.0)

## 2023-02-16 MED ORDER — POTASSIUM CHLORIDE CRYS ER 20 MEQ PO TBCR
40.0000 meq | EXTENDED_RELEASE_TABLET | Freq: Once | ORAL | Status: AC
  Administered 2023-02-16: 40 meq via ORAL
  Filled 2023-02-16: qty 2

## 2023-02-16 MED ORDER — POTASSIUM PHOSPHATES 15 MMOLE/5ML IV SOLN
30.0000 mmol | Freq: Once | INTRAVENOUS | Status: AC
  Administered 2023-02-16: 30 mmol via INTRAVENOUS
  Filled 2023-02-16: qty 10

## 2023-02-16 MED ORDER — METRONIDAZOLE 500 MG/100ML IV SOLN
500.0000 mg | Freq: Two times a day (BID) | INTRAVENOUS | Status: AC
  Administered 2023-02-16 – 2023-02-19 (×8): 500 mg via INTRAVENOUS
  Filled 2023-02-16 (×8): qty 100

## 2023-02-16 MED ORDER — METOPROLOL SUCCINATE ER 25 MG PO TB24
12.5000 mg | ORAL_TABLET | Freq: Every day | ORAL | Status: DC
  Administered 2023-02-16 – 2023-02-17 (×2): 12.5 mg via ORAL
  Filled 2023-02-16 (×2): qty 1

## 2023-02-16 MED ORDER — SODIUM CHLORIDE 0.9 % IV SOLN
2.0000 g | INTRAVENOUS | Status: DC
  Administered 2023-02-16 – 2023-02-18 (×3): 2 g via INTRAVENOUS
  Filled 2023-02-16 (×3): qty 20

## 2023-02-16 NOTE — Progress Notes (Signed)
Speech Language Pathology Treatment: Dysphagia  Patient Details Name: Katherine [name] MRN: [id number] DOB: 1945-07-17 Today's Date: 02/16/2023 Time: 1191-4782 SLP Time Calculation (min) (ACUTE ONLY): 13 min  Assessment / Plan / Recommendation Clinical Impression  Pt seen for dysphagia follow up after assessment yesterday. Pt is slow to verbally respond either due to delayed processing, possible hearing loss or combination. She did exhibit an immediate cough after larger sip from straw. Given smaller sips there were no further instances of s/s aspiration. She did sneeze several times. Mastication was slow but functional with Dys 3 without oral residue. Pt will need assist with meals and recommend she continue Dys 3 for the duration of acute hospitalization, thin liquids and small sips when using straw. Pills whole in puree and upright texture. Her CXR from today showed mild facet the left lung base, improved from the most recent prior exam, consistent with atelectasis. No convincing acute cardiopulmonary disease. No further ST needed at this time.    HPI HPI: Katherine [name] is a 78 y.o. female with history of CAD status post PCI, diabetes mellitus type 2, status post TAVR, dementia with behavioral disturbances, GERD who was recently admitted on February 05, 2023 for acute delirium in the setting of COVID infection was noticed to be having increasing difficulty breathing and fever with cough and altered mental status which has gradually worsened over the last 48 hours.  EMS on arrival found that patient was hypoxic requiring 2 L oxygen and patient was altered.      SLP Plan  All goals met;Discharge SLP treatment due to (comment)      Recommendations for follow up therapy are one component of a multi-disciplinary discharge planning process, led by the attending physician.  Recommendations may be updated based on patient status, additional functional criteria and insurance authorization.     Recommendations  Diet recommendations: Dysphagia 3 (mechanical soft);Thin liquid Liquids provided via: Straw;Cup Medication Administration: Whole meds with puree Supervision: Staff to assist with self feeding Compensations: Slow rate;Small sips/bites Postural Changes and/or Swallow Maneuvers: Seated upright 90 degrees                  Oral care BID   None Dysphagia, oral phase (R13.11)     All goals met;Discharge SLP treatment due to (comment)     [name]  02/16/2023, 9:55 AM

## 2023-02-16 NOTE — TOC Initial Note (Signed)
Transition of Care [facility]) - Initial/Assessment Note    Patient Details  Name: Katherine [name] MRN: [id number] Date of Birth: April 02, 1945  Transition of [facility]) CM/SW Contact:    Mearl Latin, LCSW Phone Number: 02/16/2023, 2:51 PM  Clinical Narrative:                 Patient admitted from [facility] term care. CSW continuing to follow and notified [facility] of potential Sunday discharge.   Expected Discharge Plan: Skilled Nursing Facility Barriers to Discharge: Continued Medical Work up   Patient Goals and CMS Choice            Expected Discharge Plan and Services In-house Referral: Clinical Social Work   Post Acute Care Choice: Skilled Nursing Facility Living arrangements for the past 2 months: Skilled Nursing Facility                                      Prior Living Arrangements/Services Living arrangements for the past 2 months: Skilled Nursing Facility Lives with:: Facility Resident Patient language and need for interpreter reviewed:: Yes Do you feel safe going back to the place where you live?: Yes      Need for Family Participation in Patient Care: Yes (Comment) Care giver support system in place?: Yes (comment)   Criminal Activity/Legal Involvement Pertinent to Current Situation/Hospitalization: No - Comment as needed  Activities of Daily Living   ADL Screening (condition at time of admission) Independently performs ADLs?: No Does the patient have a NEW difficulty with bathing/dressing/toileting/self-feeding that is expected to last >3 days?: No Does the patient have a NEW difficulty with getting in/out of bed, walking, or climbing stairs that is expected to last >3 days?: No Does the patient have a NEW difficulty with communication that is expected to last >3 days?: No Is the patient deaf or have difficulty hearing?: Yes Does the patient have difficulty seeing, even when wearing glasses/contacts?: Yes Does the patient have difficulty  concentrating, remembering, or making decisions?: Yes  Permission Sought/Granted Permission sought to share information with : Facility Medical sales representative, Family Supports Permission granted to share information with : No  Share Information with NAME: [name]  Permission granted to share info w AGENCY: [facility]  Permission granted to share info w Relationship: Sister  Permission granted to share info w Contact Information: [phone]  Emotional Assessment Appearance:: Appears stated age Attitude/Demeanor/Rapport: Unable to Assess Affect (typically observed): Unable to Assess Orientation: : Oriented to Self Alcohol / Substance Use: Not Applicable Psych Involvement: No (comment)  Admission diagnosis:  Acute respiratory failure with hypoxia (HCC) [J96.01] Sepsis (HCC) [A41.9] Community acquired pneumonia of right lower lobe of lung [J18.9] Sepsis, due to unspecified organism, unspecified whether acute organ dysfunction present [facility]) [A41.9] Patient Active Problem List   Diagnosis Date Noted   CAP (community acquired pneumonia) 02/15/2023   Acute respiratory failure with hypoxia (HCC) 02/15/2023   Acute metabolic encephalopathy 02/15/2023   Sepsis (HCC) 02/14/2023   History of present aortic stenosis status post TAVR March 2021 02/05/2023   Peripheral neuropathy 02/05/2023   Chronic diastolic CHF (congestive heart failure) (HCC) 02/05/2023   History of dementia 02/05/2023   Altered mental status 02/05/2023   Neurocognitive deficits 05/27/2021   Pelvic fracture (HCC) 04/23/2021   Hypocalcemia 04/23/2021   Coronary artery disease involving native coronary artery of native heart without angina pectoris 04/23/2021   Aortic atherosclerosis (HCC) 04/23/2021  Rotator cuff arthropathy of left shoulder 11/12/2019   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   Type 2 diabetes mellitus with complication (HCC)    S/P reverse total shoulder arthroplasty, 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   MRSA infection 10/26/2014   Diabetic foot ulcer (HCC) 10/26/2014   Pre-ulcerative corn or callous 10/09/2013   Altered mental status, unspecified 04/29/2013   Normocytic anemia 01/06/2013   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   Thyroid nodule 11/07/2012   Severe aortic stenosis 05/27/2012   Closed fracture of pelvis (HCC) 04/15/2010   Fibromyalgia 05/10/2009   Anxiety state 07/31/2007   Osteoporosis 07/31/2007   Hyperlipidemia LDL goal <70 09/05/2006   DEPRESSION 09/05/2006   Essential hypertension 09/05/2006   Allergic rhinitis 09/05/2006   GERD 09/05/2006   DIVERTICULOSIS, COLON 09/05/2006   OSTEOARTHRITIS 09/05/2006   PCP:  Coralee Rud, PA-C Pharmacy:   [id number] - [place], [place] - [address] AT [facility] OF [address] & [address] [place] [address] Phone: [phone] Fax: [phone]  [facility] - [place], [place] - [address] [place] [address] Phone: [phone] Fax: [phone]     Social Drivers of Health (SDOH) Social History: SDOH Screenings   Food Insecurity: Patient Unable To Answer (02/16/2023)  Housing: Patient Unable To Answer (02/16/2023)  Transportation Needs: Patient Unable To Answer (02/16/2023)  Utilities: Not At Risk (02/05/2023)  Depression (PHQ2-9): Low Risk  (08/28/2019)  Social Connections: Patient Unable To Answer (02/16/2023)  Recent Concern: Social Connections - Socially Isolated (02/05/2023)  Tobacco Use: Low Risk  (02/15/2023)   SDOH Interventions:     Readmission Risk Interventions     No data to display

## 2023-02-16 NOTE — [place] FL2 (Signed)
[place] MEDICAID FL2 LEVEL OF CARE FORM     IDENTIFICATION  Patient Name: Katherine [name] Birthdate: 03-Jul-1945 Sex: female Admission Date (Current Location): 02/14/2023  [facility] and [place] Number:  Producer, television/film/video and Address:  [facility], [address], [place], [place] [address]      Provider Number: [id number]  Attending Physician Name and Address:  Leroy Sea, MD  Relative Name and Phone Number:       Current Level of Care: Hospital Recommended Level of Care: Skilled Nursing Facility Prior Approval Number:    Date Approved/Denied:   PASRR Number: [id number]  Discharge Plan: SNF    Current Diagnoses: Patient Active Problem List   Diagnosis Date Noted   CAP (community acquired pneumonia) 02/15/2023   Acute respiratory failure with hypoxia (HCC) 02/15/2023   Acute metabolic encephalopathy 02/15/2023   Sepsis (HCC) 02/14/2023   History of present aortic stenosis status post TAVR March 2021 02/05/2023   Peripheral neuropathy 02/05/2023   Chronic diastolic CHF (congestive heart failure) (HCC) 02/05/2023   History of dementia 02/05/2023   Altered mental status 02/05/2023   Neurocognitive deficits 05/27/2021   Pelvic fracture (HCC) 04/23/2021   Hypocalcemia 04/23/2021   Coronary artery disease involving native coronary artery of native heart without angina pectoris 04/23/2021   Aortic atherosclerosis (HCC) 04/23/2021   Rotator cuff arthropathy of left shoulder 11/12/2019   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   Type 2 diabetes mellitus with complication (HCC)    S/P reverse total shoulder arthroplasty, 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   MRSA infection 10/26/2014   Diabetic foot ulcer (HCC) 10/26/2014   Pre-ulcerative corn or callous 10/09/2013   Altered mental status, unspecified 04/29/2013   Normocytic anemia 01/06/2013   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   Thyroid nodule 11/07/2012   Severe  aortic stenosis 05/27/2012   Closed fracture of pelvis (HCC) 04/15/2010   Fibromyalgia 05/10/2009   Anxiety state 07/31/2007   Osteoporosis 07/31/2007   Hyperlipidemia LDL goal <70 09/05/2006   DEPRESSION 09/05/2006   Essential hypertension 09/05/2006   Allergic rhinitis 09/05/2006   GERD 09/05/2006   DIVERTICULOSIS, COLON 09/05/2006   OSTEOARTHRITIS 09/05/2006    Orientation RESPIRATION BLADDER Height & Weight     Self  Normal Incontinent, External catheter Weight: 127 lb 6.8 oz (57.8 kg) Height:  4\' 11"  (149.9 cm)  BEHAVIORAL SYMPTOMS/MOOD NEUROLOGICAL BOWEL NUTRITION STATUS      Continent Diet (See dc summary)  AMBULATORY STATUS COMMUNICATION OF NEEDS Skin   Extensive Assist   Other (Comment) (Deep tissue on buttocks)                       Personal Care Assistance Level of Assistance  Bathing, Feeding, Dressing Bathing Assistance: Maximum assistance Feeding assistance: Maximum assistance Dressing Assistance: Maximum assistance     Functional Limitations Info             SPECIAL CARE FACTORS FREQUENCY  PT (By licensed PT), OT (By licensed OT)     PT Frequency: 5x/week OT Frequency: 5x/week            Contractures Contractures Info: Not present    Additional Factors Info  Code Status, Allergies, Isolation Precautions, Insulin Sliding Scale Code Status Info: DNR Allergies Info: Penicillins, Euphorbia, Coconut (Cocos Nucifera), Latex, Mellaril (Thioridazine)   Insulin Sliding Scale Info: See dc summary Isolation Precautions Info: ESBL     Current Medications (02/16/2023):  This is the current  hospital active medication list Current Facility-Administered Medications  Medication Dose Route Frequency Provider Last Rate Last Admin   0.9 %  sodium chloride infusion   Intravenous Continuous Burnadette Pop, MD 75 mL/hr at 02/16/23 0440 New Bag at 02/16/23 0440   acetaminophen (TYLENOL) tablet 650 mg  650 mg Oral Q6H PRN Eduard Clos, MD       Or    acetaminophen (TYLENOL) suppository 650 mg  650 mg Rectal Q6H PRN Eduard Clos, MD   650 mg at 02/15/23 1138   apixaban (ELIQUIS) tablet 5 mg  5 mg Oral BID Eduard Clos, MD   5 mg at 02/16/23 1208   benztropine (COGENTIN) tablet 0.5 mg  0.5 mg Oral BID Eduard Clos, MD   0.5 mg at 02/16/23 1225   cefTRIAXone (ROCEPHIN) 2 g in sodium chloride 0.9 % 100 mL IVPB  2 g Intravenous Q24H Susa Raring K, MD 200 mL/hr at 02/16/23 1215 2 g at 02/16/23 1215   memantine (NAMENDA XR) 24 hr capsule 28 mg  28 mg Oral Daily Adhikari, Willia Craze, MD   28 mg at 02/16/23 1225   And   donepezil (ARICEPT) tablet 10 mg  10 mg Oral Daily Burnadette Pop, MD   10 mg at 02/16/23 1208   insulin aspart (novoLOG) injection 0-9 Units  0-9 Units Subcutaneous TID WC Eduard Clos, MD   1 Units at 02/15/23 1002   magnesium oxide (MAG-OX) tablet 400 mg  400 mg Oral Daily Eduard Clos, MD   400 mg at 02/16/23 1208   metoprolol succinate (TOPROL-XL) 24 hr tablet 12.5 mg  12.5 mg Oral Daily John Giovanni, MD   12.5 mg at 02/16/23 0455   metroNIDAZOLE (FLAGYL) IVPB 500 mg  500 mg Intravenous Q12H Susa Raring K, MD 100 mL/hr at 02/16/23 1224 500 mg at 02/16/23 1224   pantoprazole (PROTONIX) EC tablet 40 mg  40 mg Oral Daily Eduard Clos, MD   40 mg at 02/16/23 1208   potassium PHOSPHATE 30 mmol in dextrose 5 % 500 mL infusion  30 mmol Intravenous Once Leroy Sea, MD 85 mL/hr at 02/16/23 1333 30 mmol at 02/16/23 1333   risperiDONE (RISPERDAL) tablet 2 mg  2 mg Oral QHS Eduard Clos, MD   2 mg at 02/15/23 2157   rosuvastatin (CRESTOR) tablet 40 mg  40 mg Oral QHS Eduard Clos, MD   40 mg at 02/15/23 2157   zinc oxide (BALMEX) 11.3 % cream 1 Application  1 Application Topical BID Eduard Clos, MD   1 Application at 02/16/23 1208     Discharge Medications: Please see discharge summary for a list of discharge medications.  Relevant Imaging  Results:  Relevant Lab Results:   Additional Information SSN [id number] [place], [place]

## 2023-02-16 NOTE — Evaluation (Signed)
Physical Therapy Evaluation Patient Details Name: Katherine [name] MRN: [id number] DOB: 1945-06-08 Today's Date: 02/16/2023  History of Present Illness  Pt is a 78 y.o. female admitted 1/15 with AMS and hypoxia. She was recently admitted on February 05, 2023 for acute delirium in the setting of COVID. PMH: CAD status post PCI, diabetes mellitus type 2, status post TAVR, dementia with behavioral disturbances, GERD   Clinical Impression  Pt admitted with above diagnosis. PTA pt resided at [facility]. She is a poor historian and unable to provide PLOF. Pt currently with functional limitations due to the deficits listed below (see PT Problem List). On eval, pt required max assist rolling, and total assist sup<>sit. Poor sitting balance EOB. Pt alert with eyes open. Primarily nonverbal with minimal eye contact. Pt will benefit from acute skilled PT to increase their independence and safety with mobility to allow discharge.  Recommend return to [place] upon d/c.          If plan is discharge home, recommend the following: Two people to help with walking and/or transfers;Two people to help with bathing/dressing/bathroom   Can travel by private vehicle   No    Equipment Recommendations None recommended by PT  Recommendations for Other Services       Functional Status Assessment Patient has had a recent decline in their functional status and demonstrates the ability to make significant improvements in function in a reasonable and predictable amount of time.     Precautions / Restrictions        Mobility  Bed Mobility Overal bed mobility: Needs Assistance Bed Mobility: Rolling, Supine to Sit, Sit to Supine Rolling: Max assist   Supine to sit: Total assist Sit to supine: Total assist   General bed mobility comments: no active assist from pt for transition to/from EOB    Transfers                   General transfer comment: unable to safely transition OOB with +1     Ambulation/Gait                  Stairs            Wheelchair Mobility     Tilt Bed    Modified Rankin (Stroke Patients Only)       Balance Overall balance assessment: Needs assistance Sitting-balance support: Feet supported, No upper extremity supported Sitting balance-Leahy Scale: Poor   Postural control: Posterior lean                                   Pertinent Vitals/Pain Pain Assessment Pain Assessment: PAINAD Breathing: normal Negative Vocalization: none Facial Expression: smiling or inexpressive Body Language: relaxed Consolability: no need to console PAINAD Score: 0 Pain Intervention(s): Monitored during session    Home Living Family/patient expects to be discharged to:: Skilled nursing facility                   Additional Comments: from [place]    Prior Function Prior Level of Function : Patient poor historian/Family not available             Mobility Comments: unsure of mobility level at baseline       Extremity/Trunk Assessment   Upper Extremity Assessment Upper Extremity Assessment: Defer to OT evaluation;Generalized weakness    Lower Extremity Assessment Lower Extremity Assessment: Generalized weakness    Cervical / Trunk  Assessment Cervical / Trunk Assessment: Kyphotic  Communication      Cognition Arousal: Alert Behavior During Therapy: Flat affect Overall Cognitive Status: No family/caregiver present to determine baseline cognitive functioning                                 General Comments: Primarily nonverbal. Little to no eye contact. Not following commands. Eyes open/alert. Pt did say "Good morning" in response to therapist saying good morning. No other spoken words.        General Comments General comments (skin integrity, edema, etc.): VSS on RA    Exercises     Assessment/Plan    PT Assessment Patient needs continued PT services  PT Problem List Decreased  strength;Decreased balance;Decreased cognition;Decreased mobility;Decreased activity tolerance       PT Treatment Interventions DME instruction;Functional mobility training;Balance training;Patient/family education;Gait training;Therapeutic activities;Therapeutic exercise    PT Goals (Current goals can be found in the Care Plan section)  Acute Rehab PT Goals Patient Stated Goal: not stated PT Goal Formulation: Patient unable to participate in goal setting Time For Goal Achievement: 03/02/23 Potential to Achieve Goals: Fair    Frequency Min 1X/week     Co-evaluation               AM-PAC PT "6 Clicks" Mobility  Outcome Measure Help needed turning from your back to your side while in a flat bed without using bedrails?: Total Help needed moving from lying on your back to sitting on the side of a flat bed without using bedrails?: Total Help needed moving to and from a bed to a chair (including a wheelchair)?: Total Help needed standing up from a chair using your arms (e.g., wheelchair or bedside chair)?: Total Help needed to walk in hospital room?: Total Help needed climbing 3-5 steps with a railing? : Total 6 Click Score: 6    End of Session   Activity Tolerance: Patient tolerated treatment well Patient left: in bed;with call bell/phone within reach;with bed alarm set Nurse Communication: Mobility status PT Visit Diagnosis: Other abnormalities of gait and mobility (R26.89);Muscle weakness (generalized) (M62.81)    Time: 1610-9604 PT Time Calculation (min) (ACUTE ONLY): 15 min   Charges:   PT Evaluation $PT Eval Moderate Complexity: 1 Mod   PT General Charges $$ ACUTE PT VISIT: 1 Visit         Ferd Glassing., PT  Office # [phone]   Ilda Foil 02/16/2023, 9:09 AM

## 2023-02-16 NOTE — Consult Note (Signed)
WOC Nurse Consult Note: Reason for Consult: sacral wound  Wound type: Deep Tissue Pressure Injury R upper buttock that is evolving with partial thickness skin loss  Pressure Injury POA: Yes Measurement: 3 cm x 3 cm area of purple maroon discoloration evolving with partial thickness skin loss as above  Wound bed: as above  Drainage (amount, consistency, odor) minimal serosanguinous  Periwound: minimal erythema  Dressing procedure/placement/frequency: Cleanse R buttock wound with NS, apply Xeroform gauze [place] [phone]) to wound bed daily.  Cover with silicone foam.    PLEASE RECONSULT WOC TEAM IF WOUND DEVELOPS ANY NECROTIC (BROWN/BLACK/YELLOW) TISSUE AS DEEP TISSUE PRESSURE INJURIES ARE HIGH RISK TO EVOLVE.   POC discussed with bedside nurse. WOC team will not follow. Re-consult if further needs arise.   Thank you,    Priscella Mann MSN, RN-BC, [employer] [phone]

## 2023-02-16 NOTE — Progress Notes (Signed)
Patient is  a new admission, alert, with confusion, resting quietly . Patient takes  meds with apple sauces. PT had atrial tachycardia 3.32 seconds hr 148 , PJC , unifocal PVC's @ 0250. MD notified. Sacral has blister  redness, skin breakdown.  Applied foam . Right hand skin tears, applied gauze. Consulted wound care nurse. Tremor presents on bilateral upper extremities sometimes.

## 2023-02-16 NOTE — Progress Notes (Signed)
Patient is a$ O x1. Resting quietly, Right arm has IV fluid running , infiltration, swollen bruising on hand, palpable pulses. . IV removed. Charge nurse notified. Charge nurse notified MD. Patient voided and saturated pat. @ 1923 Heart rate 140 in 10 seconds, @ 2313 hr 140 9 seconds. MD notified. The nurse fed PT one cup  of apple sauces. 25 % dinner , 200 mls water then Patient sleeping  through the night. Mepilex applied on bilateral heels. At 0515 this morning PT heart rate increase 163 with exertion , changing position one second and remained 120's . 15 minutes at rest PT has Afib RVR heart rate 96 -132, occasional PVC. Patient remains calm, no distress, oxygen sat 97 on RA. MD notified. Sacral wound dressing changed. Urine out put . LBM 02/17/2023, soft yellow stools.

## 2023-02-16 NOTE — Plan of Care (Signed)
  Problem: Fluid Volume: Goal: Ability to maintain a balanced intake and output will improve Outcome: Progressing   Problem: Metabolic: Goal: Ability to maintain appropriate glucose levels will improve Outcome: Progressing   Problem: Skin Integrity: Goal: Risk for impaired skin integrity will decrease Outcome: Progressing   Problem: Fluid Volume: Goal: Hemodynamic stability will improve Outcome: Progressing

## 2023-02-16 NOTE — Progress Notes (Signed)
PROGRESS NOTE                                                                                                                                                                                                             Patient Demographics:    Katherine [name], is a 78 y.o. female, DOB - [birth year], ZOX:[id number]  Outpatient Primary MD for the patient is Ronnald Collum    LOS - 2  Admit date - 02/14/2023    Chief Complaint  Patient presents with   Code Sepsis       Brief Narrative (HPI from H&P)   78 year old female with history of coronary disease status post PCI, diabetes type 2, TAVR, dementia with behavioral disturbances, GERD who presented from skilled nursing facility with complaint of shortness of breath, fever, cough, confusion increased from baseline.  On presentation, she was hypoxic requiring 2 L of oxygen, febrile, hypotensive.  She was diagnosed with sepsis due to UTI and pneumonia and admitted to the hospital.   Subjective:    Katherine [name] today has, No headache, No chest pain, No abdominal pain - No Nausea, No new weakness tingling or numbness, no SOB   Assessment  & Plan :   Sepsis secondary to UTI present on admission along with pneumonia: Being treated with empiric IV antibiotics, IV fluids, sepsis pathophysiology has improved, will be seen by speech therapy, follow cultures, blood cultures 1 out of 2 appears to be contaminant.  Will follow final urine culture and sensitivity data and monitor.   Acute hypoxic respiratory  failure: Secondary to pneumonia/recent COVID.  On 2 L.  Continue to wean.  Clinically much improved.   Hypokalemia: Being aggressively supplemented, magnesium as well   Diabetes type 2: Takes metformin.  Currently on sliding scale.  Monitor blood sugars   History of coronary artery disease: Status post PCI.  Metoprolol on hold due to hypotension.  On Crestor, Eliquis   Acute  metabolic encephalopathy: Secondary to sepsis/pneumonia.  CT head did not show any acute findings.   History of dementia: On Risperdal, Cogentin, Namenda.  Frequent reorientation, delirium precautions   Hypertension: Home metoprolol, hydrochlorothiazide on hold due to hypotension   GERD: Continue PPI   Paroxysmal A-fib: Takes metoprolol, Eliquis.  Monitor on telemetry       Condition - Fair  Family Communication  :  Updated niece Katherine [name] [phone]   in detail on 02/16/2023  Code Status :  DNR  Consults  :  None  PUD Prophylaxis :  PPI   Procedures  :            Disposition Plan  :    Status is: Inpatient  DVT Prophylaxis  :     apixaban (ELIQUIS) tablet 5 mg     Lab Results  Component Value Date   PLT 259 02/16/2023    Diet :  Diet Order             DIET DYS 3 Room service appropriate? Yes; Fluid consistency: Thin  Diet effective now                    Inpatient Medications  Scheduled Meds:  apixaban  5 mg Oral BID   benztropine  0.5 mg Oral BID   memantine  28 mg Oral Daily   And   donepezil  10 mg Oral Daily   insulin aspart  0-9 Units Subcutaneous TID WC   magnesium oxide  400 mg Oral Daily   metoprolol succinate  12.5 mg Oral Daily   pantoprazole  40 mg Oral Daily   potassium chloride  40 mEq Oral Once   risperiDONE  2 mg Oral QHS   rosuvastatin  40 mg Oral QHS   zinc oxide  1 Application Topical BID   Continuous Infusions:  sodium chloride 75 mL/hr at 02/16/23 0440   azithromycin 500 mg (02/16/23 0444)   ceFEPime (MAXIPIME) IV 2 g (02/15/23 2158)   potassium PHOSPHATE IVPB (in mmol)     vancomycin     PRN Meds:.acetaminophen **OR** acetaminophen   Objective:   Vitals:   02/15/23 2300 02/16/23 0100 02/16/23 0419 02/16/23 0800  BP:  (!) 112/59 (!) 148/83 (!) 144/99  Pulse:  (!) 58 83 77  Resp:  (!) 22 17 20   Temp: 98.6 F (37 C) 98.6 F (37 C)  98.9 F (37.2 C)  TempSrc: Oral Oral  Oral  SpO2:    97%  Weight:       Height:        Wt Readings from Last 3 Encounters:  02/14/23 57.8 kg  02/04/23 55 kg  06/15/21 56 kg     Intake/Output Summary (Last 24 hours) at 02/16/2023 1014 Last data filed at 02/15/2023 2158 Gross per 24 hour  Intake 452.38 ml  Output 500 ml  Net -47.62 ml     Physical Exam  Awake but confused, No new F.N deficits, Normal affect [place].AT,PERRAL Supple Neck, No JVD,   Symmetrical Chest wall movement, Good air movement bilaterally, CTAB RRR,No Gallops,Rubs or new Murmurs,  +ve B.Sounds, Abd Soft, No tenderness,   No Cyanosis, Clubbing or edema     RN pressure injury documentation: Pressure Injury 02/15/23 Buttocks Medial;Right Deep Tissue Pressure Injury - Purple or maroon localized area of discolored intact skin or blood-filled blister due to damage of underlying soft tissue from pressure and/or shear. evolving dtpi 3 cm x 3 cm w (Active)  02/15/23   Location: Buttocks  Location Orientation: Medial;Right  Staging: Deep Tissue Pressure Injury - Purple or maroon localized area of discolored intact skin or blood-filled blister due to damage of underlying soft tissue from pressure and/or shear.  Wound Description (Comments): evolving dtpi 3 cm x 3 cm with partial thickness skin loss  Present on Admission: Yes      Data Review:    Recent  Labs  Lab 02/14/23 2130 02/15/23 0438 02/16/23 0606  WBC 22.0* 20.8* 15.4*  HGB 12.5 11.2* 9.9*  HCT 39.5 35.4* 31.6*  PLT 341 273 259  MCV 95.9 96.5 96.3  MCH 30.3 30.5 30.2  MCHC 31.6 31.6 31.3  RDW 13.7 13.8 13.9  LYMPHSABS 1.4  --   --   MONOABS 1.2*  --   --   EOSABS 0.0  --   --   BASOSABS 0.0  --   --     Recent Labs  Lab 02/14/23 2130 02/14/23 2225 02/15/23 0438 02/15/23 0452 02/15/23 0657 02/15/23 1626 02/15/23 2011 02/16/23 0606 02/16/23 0827  NA 144  --  146*  --   --   --   --  148*  --   K 3.4*  --  2.9*  --   --  3.1*  --  3.4*  --   CL 104  --  109  --   --   --   --  114*  --   CO2 23  --  23   --   --   --   --  22  --   ANIONGAP 17*  --  14  --   --   --   --  12  --   GLUCOSE 197*  --  147*  --   --   --   --  126*  --   BUN 27*  --  27*  --   --   --   --  21  --   CREATININE 0.95  --  0.93  --   --   --   --  0.60  --   AST 21  --   --   --   --   --   --  20  --   ALT 18  --   --   --   --   --   --  17  --   ALKPHOS 61  --   --   --   --   --   --  52  --   BILITOT 1.1  --   --   --   --   --   --  0.8  --   ALBUMIN 3.1*  --   --   --   --   --   --  2.3*  --   CRP  --   --   --   --   --   --   --  18.0*  --   PROCALCITON  --   --   --   --   --   --   --  0.34  --   LATICACIDVEN  --  1.6  --   --   --   --   --   --   --   INR  --   --  1.6*  --   --   --  1.6*  --   --   TSH  --   --   --  0.321*  --   --   --   --   --   HGBA1C  --   --  5.9*  --   --   --   --   --   --   AMMONIA  --   --   --   --   --   --   --  15  --   BNP  --   --   --   --   --   --   --   --  548.5*  MG  --   --   --   --  1.5*  --   --  2.0  --   PHOS  --   --   --   --   --   --   --  1.7*  --   CALCIUM 9.3  --  8.4*  --   --   --   --  8.1*  --       Recent Labs  Lab 02/14/23 2130 02/14/23 2225 02/15/23 0438 02/15/23 0452 02/15/23 0657 02/15/23 2011 02/16/23 0606 02/16/23 0827  CRP  --   --   --   --   --   --  18.0*  --   PROCALCITON  --   --   --   --   --   --  0.34  --   LATICACIDVEN  --  1.6  --   --   --   --   --   --   INR  --   --  1.6*  --   --  1.6*  --   --   TSH  --   --   --  0.321*  --   --   --   --   HGBA1C  --   --  5.9*  --   --   --   --   --   AMMONIA  --   --   --   --   --   --  15  --   BNP  --   --   --   --   --   --   --  548.5*  MG  --   --   --   --  1.5*  --  2.0  --   CALCIUM 9.3  --  8.4*  --   --   --  8.1*  --     --------------------------------------------------------------------------------------------------------------- Lab Results  Component Value Date   CHOL 122 10/27/2019   HDL 50 10/27/2019   LDLCALC 52 10/27/2019   LDLDIRECT  66.0 06/16/2014   TRIG 113 10/27/2019   CHOLHDL 2.4 10/27/2019    Lab Results  Component Value Date   HGBA1C 5.9 (H) 02/15/2023   Recent Labs    02/15/23 0452  TSH 0.321*     Micro Results Recent Results (from the past 240 hours)  Blood Culture (routine x 2)     Status: None (Preliminary result)   Collection Time: 02/14/23  9:33 PM   Specimen: BLOOD  Result Value Ref Range Status   Specimen Description BLOOD LEFT ANTECUBITAL  Final   Special Requests   Final    BOTTLES DRAWN AEROBIC AND ANAEROBIC Blood Culture results may not be optimal due to an inadequate volume of blood received in culture bottles   Culture  Setup Time   Final    GRAM POSITIVE COCCI IN CLUSTERS AEROBIC BOTTLE ONLY CRITICAL RESULT CALLED TO, READ BACK BY AND VERIFIED WITH: PHARMD CAREN AMEND ON 02/15/23 @ 1652 BY DRT Performed at [facility], [address], [place], [place] [address]    Culture [facility] POSITIVE COCCI  Final   Report Status PENDING  Incomplete  Blood Culture ID Panel (Reflexed)     Status: Abnormal  Collection Time: 02/14/23  9:33 PM  Result Value Ref Range Status   Enterococcus faecalis NOT DETECTED NOT DETECTED Final   Enterococcus Faecium NOT DETECTED NOT DETECTED Final   Listeria monocytogenes NOT DETECTED NOT DETECTED Final   Staphylococcus species DETECTED (A) NOT DETECTED Final    Comment: CRITICAL RESULT CALLED TO, READ BACK BY AND VERIFIED WITH: PHARMD CAREN AMEND ON 02/15/23 @ 1652 BY DRT    Staphylococcus aureus (BCID) NOT DETECTED NOT DETECTED Final   Staphylococcus epidermidis DETECTED (A) NOT DETECTED Final    Comment: Methicillin (oxacillin) resistant coagulase negative staphylococcus. Possible blood culture contaminant (unless isolated from more than one blood culture draw or clinical case suggests pathogenicity). No antibiotic treatment is indicated for blood  culture contaminants. CRITICAL RESULT CALLED TO, READ BACK BY AND VERIFIED WITH: PHARMD CAREN AMEND ON  02/15/23 @ 1652 BY DRT    Staphylococcus lugdunensis NOT DETECTED NOT DETECTED Final   Streptococcus species NOT DETECTED NOT DETECTED Final   Streptococcus agalactiae NOT DETECTED NOT DETECTED Final   Streptococcus pneumoniae NOT DETECTED NOT DETECTED Final   Streptococcus pyogenes NOT DETECTED NOT DETECTED Final   A.calcoaceticus-baumannii NOT DETECTED NOT DETECTED Final   Bacteroides fragilis NOT DETECTED NOT DETECTED Final   Enterobacterales NOT DETECTED NOT DETECTED Final   Enterobacter cloacae complex NOT DETECTED NOT DETECTED Final   Escherichia coli NOT DETECTED NOT DETECTED Final   Klebsiella aerogenes NOT DETECTED NOT DETECTED Final   Klebsiella oxytoca NOT DETECTED NOT DETECTED Final   Klebsiella pneumoniae NOT DETECTED NOT DETECTED Final   Proteus species NOT DETECTED NOT DETECTED Final   Salmonella species NOT DETECTED NOT DETECTED Final   Serratia marcescens NOT DETECTED NOT DETECTED Final   Haemophilus influenzae NOT DETECTED NOT DETECTED Final   Neisseria meningitidis NOT DETECTED NOT DETECTED Final   Pseudomonas aeruginosa NOT DETECTED NOT DETECTED Final   Stenotrophomonas maltophilia NOT DETECTED NOT DETECTED Final   Candida albicans NOT DETECTED NOT DETECTED Final   Candida auris NOT DETECTED NOT DETECTED Final   Candida glabrata NOT DETECTED NOT DETECTED Final   Candida krusei NOT DETECTED NOT DETECTED Final   Candida parapsilosis NOT DETECTED NOT DETECTED Final   Candida tropicalis NOT DETECTED NOT DETECTED Final   Cryptococcus neoformans/gattii NOT DETECTED NOT DETECTED Final   Methicillin resistance mecA/C DETECTED (A) NOT DETECTED Final    Comment: CRITICAL RESULT CALLED TO, READ BACK BY AND VERIFIED WITH: PHARMD CAREN AMEND ON 02/15/23 @ 1652 BY DRT Performed at [facility], [address], [place], [place] [address]   Resp panel by RT-PCR (RSV, Flu A&B, Covid) Anterior Nasal Swab     Status: None   Collection Time: 02/14/23 10:41 PM   Specimen:  Anterior Nasal Swab  Result Value Ref Range Status   SARS Coronavirus 2 by RT PCR NEGATIVE NEGATIVE Final   Influenza A by PCR NEGATIVE NEGATIVE Final   Influenza B by PCR NEGATIVE NEGATIVE Final    Comment: (NOTE) The Xpert Xpress SARS-CoV-2/FLU/RSV plus assay is intended as an aid in the diagnosis of influenza from Nasopharyngeal swab specimens and should not be used as a sole basis for treatment. Nasal washings and aspirates are unacceptable for Xpert Xpress SARS-CoV-2/FLU/RSV testing.  Fact Sheet for Patients: BloggerCourse.com  Fact Sheet for Healthcare Providers: SeriousBroker.it  This test is not yet approved or cleared by the [place] FDA and has been authorized for detection and/or diagnosis of SARS-CoV-2 by FDA under an Emergency Use Authorization (EUA). This EUA  will remain in effect (meaning this test can be used) for the duration of the COVID-19 declaration under Section 564(b)(1) of the Act, 21 U.S.C. section 360bbb-3(b)(1), unless the authorization is terminated or revoked.     Resp Syncytial Virus by PCR NEGATIVE NEGATIVE Final    Comment: (NOTE) Fact Sheet for Patients: BloggerCourse.com  Fact Sheet for Healthcare Providers: SeriousBroker.it  This test is not yet approved or cleared by the [place] FDA and has been authorized for detection and/or diagnosis of SARS-CoV-2 by FDA under an Emergency Use Authorization (EUA). This EUA will remain in effect (meaning this test can be used) for the duration of the COVID-19 declaration under Section 564(b)(1) of the Act, 21 U.S.C. section 360bbb-3(b)(1), unless the authorization is terminated or revoked.  Performed at [facility] Lab, [address], [place], [place] [address]   Urine Culture     Status: Abnormal (Preliminary result)   Collection Time: 02/14/23 10:41 PM   Specimen: Urine, Catheterized   Result Value Ref Range Status   Specimen Description URINE, CATHETERIZED  Final   Special Requests NONE Reflexed from [id number]  Final   Culture (A)  Final    >=100,000 COLONIES/mL GRAM NEGATIVE RODS IDENTIFICATION AND SUSCEPTIBILITIES TO FOLLOW Performed at [facility], [address], [place], [place] [address]    Report Status PENDING  Incomplete  Blood Culture (routine x 2)     Status: None (Preliminary result)   Collection Time: 02/14/23 10:43 PM   Specimen: BLOOD  Result Value Ref Range Status   Specimen Description BLOOD SITE NOT SPECIFIED  Final   Special Requests   Final    BOTTLES DRAWN AEROBIC AND ANAEROBIC Blood Culture adequate volume   Culture   Final    NO GROWTH 2 DAYS Performed at [facility], [address], [place], [place] [address]    Report Status PENDING  Incomplete  MRSA Next Gen by PCR, Nasal     Status: None   Collection Time: 02/15/23  4:26 PM   Specimen: Nasal Mucosa; Nasal Swab  Result Value Ref Range Status   MRSA by PCR Next Gen NOT DETECTED NOT DETECTED Final    Comment: (NOTE) The GeneXpert MRSA Assay (FDA approved for NASAL specimens only), is one component of a comprehensive MRSA colonization surveillance program. It is not intended to diagnose MRSA infection nor to guide or monitor treatment for MRSA infections. Test performance is not FDA approved in patients less than 40 years old. Performed at [facility], [address] [place], [place] [address]     Radiology Reports DG Chest [place] 1 View Result Date: 02/16/2023 CLINICAL DATA:  Short of breath. EXAM: PORTABLE CHEST 1 VIEW COMPARISON:  02/14/2023 and older exams.  CT, 04/03/2019. FINDINGS: Cardiac silhouette mostly obscured by the left hemidiaphragm. Stable changes from previous aortic valve replacement. No mediastinal or hilar masses. Lung volumes are low. There is mild opacity at the left lung base consistent with atelectasis. No convincing pneumonia or overt  pulmonary edema. No pneumothorax. IMPRESSION: 1. Mild facet the left lung base, improved from the most recent prior exam, consistent with atelectasis. No convincing acute cardiopulmonary disease. Electronically Signed   By: Amie Portland M.D.   On: 02/16/2023 07:53   CT HEAD WO CONTRAST ( ) Result Date: 02/15/2023 CLINICAL DATA:  Hypoxia and lethargy in the setting of COVID. Mental status change with unknown cause. EXAM: CT HEAD WITHOUT CONTRAST TECHNIQUE: Contiguous axial images were obtained from the  base of the skull through the vertex without intravenous contrast. RADIATION DOSE REDUCTION: This exam was performed according to the departmental dose-optimization program which includes automated exposure control, adjustment of the mA and/or kV according to patient size and/or use of iterative reconstruction technique. COMPARISON:  Brain MRI 02/05/2023 FINDINGS: Brain: No evidence of acute infarction, hemorrhage, hydrocephalus, extra-axial collection or mass lesion/mass effect. Bands of streak artifact best seen on reformats. Mild chronic small vessel disease in the cerebral white matter for age. Vascular: No hyperdense vessel or unexpected calcification. Skull: Normal. Negative for fracture or focal lesion. Sinuses/Orbits: No acute finding. IMPRESSION: Aging brain without acute or interval finding. Electronically Signed   By: Tiburcio Pea M.D.   On: 02/15/2023 05:31   DG Chest Port 1 View Result Date: 02/14/2023 CLINICAL DATA:  Questionable sepsis - evaluate for abnormality EXAM: PORTABLE CHEST 1 VIEW COMPARISON:  02/04/2023 FINDINGS: Low lung volumes persist. Stable heart size and mediastinal contours. TAVR streaky opacity in the left greater than right lung base of increased from prior exam. No pulmonary edema. No pneumothorax or large pleural effusion. First right shoulder arthroplasty. Remote left rib fractures. IMPRESSION: Low lung volumes with streaky opacity in the left greater than right lung  base, increased from prior exam, may represent atelectasis or pneumonia. Electronically Signed   By: Narda Rutherford M.D.   On: 02/14/2023 21:52      Signature  -   Susa Raring M.D on 02/16/2023 at 10:14 AM   -  To page go to [email or web address]

## 2023-02-17 DIAGNOSIS — A419 Sepsis, unspecified organism: Secondary | ICD-10-CM | POA: Diagnosis not present

## 2023-02-17 LAB — CBC WITH DIFFERENTIAL/PLATELET
Abs Immature Granulocytes: 0.12 10*3/uL — ABNORMAL HIGH (ref 0.00–0.07)
Basophils Absolute: 0 10*3/uL (ref 0.0–0.1)
Basophils Relative: 0 %
Eosinophils Absolute: 0.1 10*3/uL (ref 0.0–0.5)
Eosinophils Relative: 1 %
HCT: 31.5 % — ABNORMAL LOW (ref 36.0–46.0)
Hemoglobin: 10.2 g/dL — ABNORMAL LOW (ref 12.0–15.0)
Immature Granulocytes: 1 %
Lymphocytes Relative: 8 %
Lymphs Abs: 1.1 10*3/uL (ref 0.7–4.0)
MCH: 30.6 pg (ref 26.0–34.0)
MCHC: 32.4 g/dL (ref 30.0–36.0)
MCV: 94.6 fL (ref 80.0–100.0)
Monocytes Absolute: 0.8 10*3/uL (ref 0.1–1.0)
Monocytes Relative: 6 %
Neutro Abs: 11.6 10*3/uL — ABNORMAL HIGH (ref 1.7–7.7)
Neutrophils Relative %: 84 %
Platelets: 252 10*3/uL (ref 150–400)
RBC: 3.33 MIL/uL — ABNORMAL LOW (ref 3.87–5.11)
RDW: 13.9 % (ref 11.5–15.5)
WBC: 13.7 10*3/uL — ABNORMAL HIGH (ref 4.0–10.5)
nRBC: 0 % (ref 0.0–0.2)

## 2023-02-17 LAB — CULTURE, BLOOD (ROUTINE X 2)

## 2023-02-17 LAB — COMPREHENSIVE METABOLIC PANEL
ALT: 17 U/L (ref 0–44)
AST: 20 U/L (ref 15–41)
Albumin: 2.3 g/dL — ABNORMAL LOW (ref 3.5–5.0)
Alkaline Phosphatase: 52 U/L (ref 38–126)
Anion gap: 9 (ref 5–15)
BUN: 11 mg/dL (ref 8–23)
CO2: 23 mmol/L (ref 22–32)
Calcium: 7.5 mg/dL — ABNORMAL LOW (ref 8.9–10.3)
Chloride: 112 mmol/L — ABNORMAL HIGH (ref 98–111)
Creatinine, Ser: 0.61 mg/dL (ref 0.44–1.00)
GFR, Estimated: >60 mL/min (ref >60)
Glucose, Bld: 144 mg/dL — ABNORMAL HIGH (ref 70–99)
Potassium: 3.1 mmol/L — ABNORMAL LOW (ref 3.5–5.1)
Sodium: 144 mmol/L (ref 135–145)
Total Bilirubin: 0.6 mg/dL (ref 0.0–1.2)
Total Protein: 5.7 g/dL — ABNORMAL LOW (ref 6.5–8.1)

## 2023-02-17 LAB — PROCALCITONIN: Procalcitonin: 0.15 ng/mL

## 2023-02-17 LAB — BRAIN NATRIURETIC PEPTIDE: B Natriuretic Peptide: 1178.7 pg/mL — ABNORMAL HIGH (ref 0.0–100.0)

## 2023-02-17 LAB — GLUCOSE, CAPILLARY
Glucose-Capillary: 127 mg/dL — ABNORMAL HIGH (ref 70–99)
Glucose-Capillary: 177 mg/dL — ABNORMAL HIGH (ref 70–99)
Glucose-Capillary: 158 mg/dL — ABNORMAL HIGH (ref 70–99)

## 2023-02-17 LAB — AMMONIA: Ammonia: 24 umol/L (ref 9–35)

## 2023-02-17 LAB — PHOSPHORUS: Phosphorus: 2.4 mg/dL — ABNORMAL LOW (ref 2.5–4.6)

## 2023-02-17 LAB — MAGNESIUM: Magnesium: 1.5 mg/dL — ABNORMAL LOW (ref 1.7–2.4)

## 2023-02-17 LAB — C-REACTIVE PROTEIN: CRP: 10.8 mg/dL — ABNORMAL HIGH (ref <1.0)

## 2023-02-17 MED ORDER — SODIUM CHLORIDE 0.9 % IV SOLN: INTRAVENOUS | Status: AC

## 2023-02-17 MED ORDER — METOPROLOL TARTRATE 50 MG PO TABS
50.0000 mg | ORAL_TABLET | Freq: Two times a day (BID) | ORAL | Status: DC
  Administered 2023-02-17 (×2): 50 mg via ORAL
  Filled 2023-02-17 (×2): qty 1

## 2023-02-17 MED ORDER — POTASSIUM CHLORIDE 10 MEQ/100ML IV SOLN
10.0000 meq | INTRAVENOUS | Status: AC
  Administered 2023-02-17 (×4): 10 meq via INTRAVENOUS
  Filled 2023-02-17 (×4): qty 100

## 2023-02-17 MED ORDER — SODIUM PHOSPHATES 45 MMOLE/15ML IV SOLN
30.0000 mmol | Freq: Once | INTRAVENOUS | Status: AC
  Administered 2023-02-17: 30 mmol via INTRAVENOUS
  Filled 2023-02-17: qty 10

## 2023-02-17 MED ORDER — METOPROLOL TARTRATE 5 MG/5ML IV SOLN
5.0000 mg | Freq: Three times a day (TID) | INTRAVENOUS | Status: DC | PRN
  Administered 2023-02-17: 5 mg via INTRAVENOUS
  Filled 2023-02-17: qty 5

## 2023-02-17 MED ORDER — DILTIAZEM HCL 25 MG/5ML IV SOLN
10.0000 mg | Freq: Four times a day (QID) | INTRAVENOUS | Status: DC | PRN
  Administered 2023-02-17: 10 mg via INTRAVENOUS
  Filled 2023-02-17: qty 5

## 2023-02-17 MED ORDER — MAGNESIUM SULFATE 4 GM/100ML IV SOLN
4.0000 g | Freq: Once | INTRAVENOUS | Status: AC
  Administered 2023-02-17: 4 g via INTRAVENOUS
  Filled 2023-02-17: qty 100

## 2023-02-17 MED ORDER — SODIUM CHLORIDE 0.9 % IV BOLUS
1000.0000 mL | Freq: Once | INTRAVENOUS | Status: AC
  Administered 2023-02-17: 1000 mL via INTRAVENOUS

## 2023-02-17 MED ORDER — LEVOTHYROXINE SODIUM 50 MCG PO TABS
50.0000 ug | ORAL_TABLET | Freq: Every day | ORAL | Status: DC
  Administered 2023-02-17 – 2023-02-18 (×2): 50 ug via ORAL
  Filled 2023-02-17 (×2): qty 1

## 2023-02-17 NOTE — Plan of Care (Signed)
  Problem: Coping: Goal: Ability to adjust to condition or change in health will improve Outcome: Progressing   Problem: Fluid Volume: Goal: Ability to maintain a balanced intake and output will improve Outcome: Progressing

## 2023-02-17 NOTE — Progress Notes (Signed)
PROGRESS NOTE                                                                                                                                                                                                             Patient Demographics:    Katherine [name], is a 78 y.o. female, DOB - 28-Jan-1946, ZOX:[id number]  Outpatient Primary MD for the patient is Ronnald Collum    LOS - 3  Admit date - 02/14/2023    Chief Complaint  Patient presents with   Code Sepsis       Brief Narrative (HPI from H&P)   78 year old female with history of coronary disease status post PCI, diabetes type 2, TAVR, dementia with behavioral disturbances, GERD who presented from skilled nursing facility with complaint of shortness of breath, fever, cough, confusion increased from baseline.  On presentation, she was hypoxic requiring 2 L of oxygen, febrile, hypotensive.  She was diagnosed with sepsis due to UTI and pneumonia and admitted to the hospital.   Subjective:   Patient in bed appears comfortable and sleeping, arousable, baseline dementia, denies any headache or chest pain.   Assessment  & Plan :   Sepsis secondary to UTI present on admission along with pneumonia: Being treated with empiric IV antibiotics, IV fluids, sepsis pathophysiology has improved, will be seen by speech therapy, follow cultures, blood cultures 1 out of 2 appears to be contaminant.  Will follow final urine culture and sensitivity data and monitor.  Clinically improving likely discharge in the next 1 to 2 days.   Acute hypoxic respiratory  failure: Secondary to pneumonia/recent COVID.  On 2 L.  Continue to wean.  Clinically much improved.   Hypokalemia, hypomagnesemia, hypophosphatemia: Replaced.   History of coronary artery disease: Status post PCI.  Metoprolol on hold due to hypotension.  On Crestor, Eliquis   Acute metabolic encephalopathy: Secondary to  sepsis/pneumonia.  CT head did not show any acute findings.   History of dementia: On Risperdal, Cogentin, Namenda.  Frequent reorientation, delirium precautions   Hypertension: Home metoprolol, hydrochlorothiazide on hold due to hypotension   GERD: Continue PPI   Paroxysmal A-fib: Takes metoprolol, Eliquis.  Monitor on telemetry   Diabetes type 2: Takes metformin.  Currently on sliding scale.  Monitor blood sugars  CBG (last 3)  Recent Labs    02/16/23  1211 02/16/23 1536 02/16/23 2049  GLUCAP 98 130* 135*         Condition - Fair  Family Communication  : Updated niece Lupita Leash [phone]   in detail on 02/16/2023  Code Status :  DNR  Consults  :  None  PUD Prophylaxis :  PPI   Procedures  :            Disposition Plan  :    Status is: Inpatient  DVT Prophylaxis  :     apixaban (ELIQUIS) tablet 5 mg     Lab Results  Component Value Date   PLT 252 02/17/2023    Diet :  Diet Order             DIET DYS 3 Room service appropriate? Yes; Fluid consistency: Thin  Diet effective now                    Inpatient Medications  Scheduled Meds:  apixaban  5 mg Oral BID   benztropine  0.5 mg Oral BID   memantine  28 mg Oral Daily   And   donepezil  10 mg Oral Daily   insulin aspart  0-9 Units Subcutaneous TID WC   levothyroxine  50 mcg Oral Q0600   magnesium oxide  400 mg Oral Daily   metoprolol succinate  12.5 mg Oral Daily   pantoprazole  40 mg Oral Daily   risperiDONE  2 mg Oral QHS   rosuvastatin  40 mg Oral QHS   zinc oxide  1 Application Topical BID   Continuous Infusions:  sodium chloride     cefTRIAXone (ROCEPHIN)  IV 2 g (02/16/23 1215)   magnesium sulfate bolus IVPB     metronidazole 500 mg (02/16/23 2137)   potassium chloride     sodium PHOSPHATE IVPB (in mmol)     PRN Meds:.acetaminophen **OR** acetaminophen   Objective:   Vitals:   02/16/23 1200 02/16/23 2049 02/17/23 0048 02/17/23 0440  BP: 133/68 (!) 139/90 137/82  136/79  Pulse: 70 89 75 74  Resp: 19 (!) 21 (!) 25 (!) 21  Temp: 98.2 F (36.8 C) 99.9 F (37.7 C) 98.2 F (36.8 C) (!) 96.4 F (35.8 C)  TempSrc: Oral Axillary Oral Axillary  SpO2: 98% 100% 100% 98%  Weight:      Height:        Wt Readings from Last 3 Encounters:  02/14/23 57.8 kg  02/04/23 55 kg  06/15/21 56 kg     Intake/Output Summary (Last 24 hours) at 02/17/2023 0834 Last data filed at 02/17/2023 0400 Gross per 24 hour  Intake 2420.88 ml  Output 1450 ml  Net 970.88 ml     Physical Exam  Awake but confused, No new F.N deficits, Normal affect Leonard.AT,PERRAL Supple Neck, No JVD,   Symmetrical Chest wall movement, Good air movement bilaterally, CTAB RRR,No Gallops,Rubs or new Murmurs,  +ve B.Sounds, Abd Soft, No tenderness,   No Cyanosis, Clubbing or edema     RN pressure injury documentation: Pressure Injury 02/15/23 Buttocks Medial;Right Deep Tissue Pressure Injury - Purple or maroon localized area of discolored intact skin or blood-filled blister due to damage of underlying soft tissue from pressure and/or shear. evolving dtpi 3 cm x 3 cm w (Active)  02/15/23   Location: Buttocks  Location Orientation: Medial;Right  Staging: Deep Tissue Pressure Injury - Purple or maroon localized area of discolored intact skin or blood-filled blister due to damage of underlying soft tissue from  pressure and/or shear.  Wound Description (Comments): evolving dtpi 3 cm x 3 cm with partial thickness skin loss  Present on Admission: Yes  Dressing Type Foam - Lift dressing to assess site every shift (xeroform) 02/16/23 1241      Data Review:    Recent Labs  Lab 02/14/23 2130 02/15/23 0438 02/16/23 0606 02/17/23 0632  WBC 22.0* 20.8* 15.4* 13.7*  HGB 12.5 11.2* 9.9* 10.2*  HCT 39.5 35.4* 31.6* 31.5*  PLT 341 273 259 252  MCV 95.9 96.5 96.3 94.6  MCH 30.3 30.5 30.2 30.6  MCHC 31.6 31.6 31.3 32.4  RDW 13.7 13.8 13.9 13.9  LYMPHSABS 1.4  --   --  1.1  MONOABS 1.2*  --    --  0.8  EOSABS 0.0  --   --  0.1  BASOSABS 0.0  --   --  0.0    Recent Labs  Lab 02/14/23 2130 02/14/23 2225 02/15/23 0438 02/15/23 0452 02/15/23 0657 02/15/23 1626 02/15/23 2011 02/16/23 0606 02/16/23 0827 02/16/23 1036 02/17/23 0632  NA 144  --  146*  --   --   --   --  148*  --   --  144  K 3.4*  --  2.9*  --   --  3.1*  --  3.4*  --   --  3.1*  CL 104  --  109  --   --   --   --  114*  --   --  112*  CO2 23  --  23  --   --   --   --  22  --   --  23  ANIONGAP 17*  --  14  --   --   --   --  12  --   --  9  GLUCOSE 197*  --  147*  --   --   --   --  126*  --   --  144*  BUN 27*  --  27*  --   --   --   --  21  --   --  11  CREATININE 0.95  --  0.93  --   --   --   --  0.60  --   --  0.61  AST 21  --   --   --   --   --   --  20  --   --  20  ALT 18  --   --   --   --   --   --  17  --   --  17  ALKPHOS 61  --   --   --   --   --   --  52  --   --  52  BILITOT 1.1  --   --   --   --   --   --  0.8  --   --  0.6  ALBUMIN 3.1*  --   --   --   --   --   --  2.3*  --   --  2.3*  CRP  --   --   --   --   --   --   --  18.0*  --   --  10.8*  PROCALCITON  --   --   --   --   --   --   --  0.34  --   --   --  LATICACIDVEN  --  1.6  --   --   --   --   --   --   --   --   --   INR  --   --  1.6*  --   --   --  1.6*  --   --   --   --   TSH  --   --   --  0.321*  --   --   --   --   --  0.311*  --   HGBA1C  --   --  5.9*  --   --   --   --   --   --   --   --   AMMONIA  --   --   --   --   --   --   --  15  --   --  24  BNP  --   --   --   --   --   --   --   --  548.5*  --  1,178.7*  MG  --   --   --   --  1.5*  --   --  2.0  --   --  1.5*  PHOS  --   --   --   --   --   --   --  1.7*  --   --  2.4*  CALCIUM 9.3  --  8.4*  --   --   --   --  8.1*  --   --  7.5*      Recent Labs  Lab 02/14/23 2130 02/14/23 2225 02/15/23 0438 02/15/23 0452 02/15/23 0657 02/15/23 2011 02/16/23 0606 02/16/23 0827 02/16/23 1036 02/17/23 0632  CRP  --   --   --   --   --   --  18.0*  --    --  10.8*  PROCALCITON  --   --   --   --   --   --  0.34  --   --   --   LATICACIDVEN  --  1.6  --   --   --   --   --   --   --   --   INR  --   --  1.6*  --   --  1.6*  --   --   --   --   TSH  --   --   --  0.321*  --   --   --   --  0.311*  --   HGBA1C  --   --  5.9*  --   --   --   --   --   --   --   AMMONIA  --   --   --   --   --   --  15  --   --  24  BNP  --   --   --   --   --   --   --  548.5*  --  1,178.7*  MG  --   --   --   --  1.5*  --  2.0  --   --  1.5*  CALCIUM 9.3  --  8.4*  --   --   --  8.1*  --   --  7.5*    --------------------------------------------------------------------------------------------------------------- Lab Results  Component Value Date   CHOL 122  10/27/2019   HDL 50 10/27/2019   LDLCALC 52 10/27/2019   LDLDIRECT 66.0 06/16/2014   TRIG 113 10/27/2019   CHOLHDL 2.4 10/27/2019    Lab Results  Component Value Date   HGBA1C 5.9 (H) 02/15/2023   Recent Labs    02/16/23 0605 02/16/23 1036  TSH  --  0.311*  FREET4 1.71*  --      Micro Results Recent Results (from the past 240 hours)  Blood Culture (routine x 2)     Status: Abnormal (Preliminary result)   Collection Time: 02/14/23  9:33 PM   Specimen: BLOOD  Result Value Ref Range Status   Specimen Description BLOOD LEFT ANTECUBITAL  Final   Special Requests   Final    BOTTLES DRAWN AEROBIC AND ANAEROBIC Blood Culture results may not be optimal due to an inadequate volume of blood received in culture bottles   Culture  Setup Time   Final    GRAM POSITIVE COCCI IN CLUSTERS AEROBIC BOTTLE ONLY CRITICAL RESULT CALLED TO, READ BACK BY AND VERIFIED WITH: PHARMD CAREN AMEND ON 02/15/23 @ 1652 BY DRT    Culture (A)  Final    STAPHYLOCOCCUS HOMINIS CULTURE REINCUBATED FOR BETTER GROWTH THE SIGNIFICANCE OF ISOLATING THIS ORGANISM FROM A SINGLE SET OF BLOOD CULTURES WHEN MULTIPLE SETS ARE DRAWN IS UNCERTAIN. PLEASE NOTIFY THE MICROBIOLOGY DEPARTMENT WITHIN ONE WEEK IF SPECIATION AND  SENSITIVITIES ARE REQUIRED. Performed at [facility], [address] [place], [place] [address]    Report Status PENDING  Incomplete  Blood Culture ID Panel (Reflexed)     Status: Abnormal   Collection Time: 02/14/23  9:33 PM  Result Value Ref Range Status   Enterococcus faecalis NOT DETECTED NOT DETECTED Final   Enterococcus Faecium NOT DETECTED NOT DETECTED Final   Listeria monocytogenes NOT DETECTED NOT DETECTED Final   Staphylococcus species DETECTED (A) NOT DETECTED Final    Comment: CRITICAL RESULT CALLED TO, READ BACK BY AND VERIFIED WITH: PHARMD CAREN AMEND ON 02/15/23 @ 1652 BY DRT    Staphylococcus aureus (BCID) NOT DETECTED NOT DETECTED Final   Staphylococcus epidermidis DETECTED (A) NOT DETECTED Final    Comment: Methicillin (oxacillin) resistant coagulase negative staphylococcus. Possible blood culture contaminant (unless isolated from more than one blood culture draw or clinical case suggests pathogenicity). No antibiotic treatment is indicated for blood  culture contaminants. CRITICAL RESULT CALLED TO, READ BACK BY AND VERIFIED WITH: PHARMD CAREN AMEND ON 02/15/23 @ 1652 BY DRT    Staphylococcus lugdunensis NOT DETECTED NOT DETECTED Final   Streptococcus species NOT DETECTED NOT DETECTED Final   Streptococcus agalactiae NOT DETECTED NOT DETECTED Final   Streptococcus pneumoniae NOT DETECTED NOT DETECTED Final   Streptococcus pyogenes NOT DETECTED NOT DETECTED Final   A.calcoaceticus-baumannii NOT DETECTED NOT DETECTED Final   Bacteroides fragilis NOT DETECTED NOT DETECTED Final   Enterobacterales NOT DETECTED NOT DETECTED Final   Enterobacter cloacae complex NOT DETECTED NOT DETECTED Final   Escherichia coli NOT DETECTED NOT DETECTED Final   Klebsiella aerogenes NOT DETECTED NOT DETECTED Final   Klebsiella oxytoca NOT DETECTED NOT DETECTED Final   Klebsiella pneumoniae NOT DETECTED NOT DETECTED Final   Proteus species NOT DETECTED NOT DETECTED Final    Salmonella species NOT DETECTED NOT DETECTED Final   Serratia marcescens NOT DETECTED NOT DETECTED Final   Haemophilus influenzae NOT DETECTED NOT DETECTED Final   Neisseria meningitidis NOT DETECTED NOT DETECTED Final   Pseudomonas aeruginosa NOT DETECTED NOT DETECTED Final   Stenotrophomonas  maltophilia NOT DETECTED NOT DETECTED Final   Candida albicans NOT DETECTED NOT DETECTED Final   Candida auris NOT DETECTED NOT DETECTED Final   Candida glabrata NOT DETECTED NOT DETECTED Final   Candida krusei NOT DETECTED NOT DETECTED Final   Candida parapsilosis NOT DETECTED NOT DETECTED Final   Candida tropicalis NOT DETECTED NOT DETECTED Final   Cryptococcus neoformans/gattii NOT DETECTED NOT DETECTED Final   Methicillin resistance mecA/C DETECTED (A) NOT DETECTED Final    Comment: CRITICAL RESULT CALLED TO, READ BACK BY AND VERIFIED WITH: PHARMD CAREN AMEND ON 02/15/23 @ 1652 BY DRT Performed at [facility], [address], [place], [place] [address]   Resp panel by RT-PCR (RSV, Flu A&B, Covid) Anterior Nasal Swab     Status: None   Collection Time: 02/14/23 10:41 PM   Specimen: Anterior Nasal Swab  Result Value Ref Range Status   SARS Coronavirus 2 by RT PCR NEGATIVE NEGATIVE Final   Influenza A by PCR NEGATIVE NEGATIVE Final   Influenza B by PCR NEGATIVE NEGATIVE Final    Comment: (NOTE) The Xpert Xpress SARS-CoV-2/FLU/RSV plus assay is intended as an aid in the diagnosis of influenza from Nasopharyngeal swab specimens and should not be used as a sole basis for treatment. Nasal washings and aspirates are unacceptable for Xpert Xpress SARS-CoV-2/FLU/RSV testing.  Fact Sheet for Patients: BloggerCourse.com  Fact Sheet for Healthcare Providers: SeriousBroker.it  This test is not yet approved or cleared by the [place] FDA and has been authorized for detection and/or diagnosis of SARS-CoV-2 by FDA under an Emergency Use  Authorization (EUA). This EUA will remain in effect (meaning this test can be used) for the duration of the COVID-19 declaration under Section 564(b)(1) of the Act, 21 U.S.C. section 360bbb-3(b)(1), unless the authorization is terminated or revoked.     Resp Syncytial Virus by PCR NEGATIVE NEGATIVE Final    Comment: (NOTE) Fact Sheet for Patients: BloggerCourse.com  Fact Sheet for Healthcare Providers: SeriousBroker.it  This test is not yet approved or cleared by the [place] FDA and has been authorized for detection and/or diagnosis of SARS-CoV-2 by FDA under an Emergency Use Authorization (EUA). This EUA will remain in effect (meaning this test can be used) for the duration of the COVID-19 declaration under Section 564(b)(1) of the Act, 21 U.S.C. section 360bbb-3(b)(1), unless the authorization is terminated or revoked.  Performed at [facility], [address], [place], [place] [address]   Urine Culture     Status: Abnormal (Preliminary result)   Collection Time: 02/14/23 10:41 PM   Specimen: Urine, Catheterized  Result Value Ref Range Status   Specimen Description URINE, CATHETERIZED  Final   Special Requests NONE Reflexed from [id number]  Final   Culture (A)  Final    >=100,000 COLONIES/mL GRAM NEGATIVE RODS IDENTIFICATION AND SUSCEPTIBILITIES TO FOLLOW Performed at [facility], [address], [place], [place] [address]    Report Status PENDING  Incomplete  Blood Culture (routine x 2)     Status: None (Preliminary result)   Collection Time: 02/14/23 10:43 PM   Specimen: BLOOD  Result Value Ref Range Status   Specimen Description BLOOD SITE NOT SPECIFIED  Final   Special Requests   Final    BOTTLES DRAWN AEROBIC AND ANAEROBIC Blood Culture adequate volume   Culture   Final    NO GROWTH 2 DAYS Performed at [facility], [address], [place], [place] [address]    Report  Status PENDING   Incomplete  MRSA Next Gen by PCR, Nasal     Status: None   Collection Time: 02/15/23  4:26 PM   Specimen: Nasal Mucosa; Nasal Swab  Result Value Ref Range Status   MRSA by PCR Next Gen NOT DETECTED NOT DETECTED Final    Comment: (NOTE) The GeneXpert MRSA Assay (FDA approved for NASAL specimens only), is one component of a comprehensive MRSA colonization surveillance program. It is not intended to diagnose MRSA infection nor to guide or monitor treatment for MRSA infections. Test performance is not FDA approved in patients less than 73 years old. Performed at [facility], [address], [place], [place] [address]     Radiology Reports DG Chest [place] 1 View Result Date: 02/16/2023 CLINICAL DATA:  Short of breath. EXAM: PORTABLE CHEST 1 VIEW COMPARISON:  02/14/2023 and older exams.  CT, 04/03/2019. FINDINGS: Cardiac silhouette mostly obscured by the left hemidiaphragm. Stable changes from previous aortic valve replacement. No mediastinal or hilar masses. Lung volumes are low. There is mild opacity at the left lung base consistent with atelectasis. No convincing pneumonia or overt pulmonary edema. No pneumothorax. IMPRESSION: 1. Mild facet the left lung base, improved from the most recent prior exam, consistent with atelectasis. No convincing acute cardiopulmonary disease. Electronically Signed   By: Amie Portland M.D.   On: 02/16/2023 07:53   CT HEAD WO CONTRAST ( ) Result Date: 02/15/2023 CLINICAL DATA:  Hypoxia and lethargy in the setting of COVID. Mental status change with unknown cause. EXAM: CT HEAD WITHOUT CONTRAST TECHNIQUE: Contiguous axial images were obtained from the base of the skull through the vertex without intravenous contrast. RADIATION DOSE REDUCTION: This exam was performed according to the departmental dose-optimization program which includes automated exposure control, adjustment of the mA and/or kV according to patient size and/or use of iterative reconstruction  technique. COMPARISON:  Brain MRI 02/05/2023 FINDINGS: Brain: No evidence of acute infarction, hemorrhage, hydrocephalus, extra-axial collection or mass lesion/mass effect. Bands of streak artifact best seen on reformats. Mild chronic small vessel disease in the cerebral white matter for age. Vascular: No hyperdense vessel or unexpected calcification. Skull: Normal. Negative for fracture or focal lesion. Sinuses/Orbits: No acute finding. IMPRESSION: Aging brain without acute or interval finding. Electronically Signed   By: Tiburcio Pea M.D.   On: 02/15/2023 05:31   DG Chest Port 1 View Result Date: 02/14/2023 CLINICAL DATA:  Questionable sepsis - evaluate for abnormality EXAM: PORTABLE CHEST 1 VIEW COMPARISON:  02/04/2023 FINDINGS: Low lung volumes persist. Stable heart size and mediastinal contours. TAVR streaky opacity in the left greater than right lung base of increased from prior exam. No pulmonary edema. No pneumothorax or large pleural effusion. First right shoulder arthroplasty. Remote left rib fractures. IMPRESSION: Low lung volumes with streaky opacity in the left greater than right lung base, increased from prior exam, may represent atelectasis or pneumonia. Electronically Signed   By: Narda Rutherford M.D.   On: 02/14/2023 21:52      Signature  -   Susa Raring M.D on 02/17/2023 at 8:34 AM   -  To page go to [email or web address]

## 2023-02-17 NOTE — Progress Notes (Signed)
   02/17/23 1110  Assess: MEWS Score  BP 115/84  MAP (mmHg) 93  Pulse Rate (!) 159  ECG Heart Rate (!) 160  Resp (!) 28  Level of Consciousness Alert  SpO2 98 %  Assess: MEWS Score  MEWS Temp 0  MEWS Systolic 0  MEWS Pulse 3  MEWS RR 2  MEWS LOC 0  MEWS Score 5  MEWS Score Color Red  Assess: if the MEWS score is Yellow or Red  Were vital signs accurate and taken at a resting state? Yes  Does the patient meet 2 or more of the SIRS criteria? No  MEWS guidelines implemented  Yes, red  Treat  MEWS Interventions Considered administering scheduled or prn medications/treatments as ordered  Take Vital Signs  Increase Vital Sign Frequency  Red: Q1hr x2, continue Q4hrs until patient remains green for 12hrs  Escalate  MEWS: Escalate Red: Discuss with charge nurse and notify provider. Consider notifying RRT. If remains red for 2 hours consider need for higher level of care  Notify: Charge Nurse/RN  Name of Charge Nurse/RN Notified [facility] RN  Provider Notification  Provider Name/Title Dr Thedore Mins  Date Provider Notified 02/17/23  Time Provider Notified 1113  Method of Notification Face-to-face (secure chst)  Notification Reason New onset of dysrhythmia  Provider response At bedside;See new orders  Date of Provider Response 02/17/23  Time of Provider Response 1113  Assess: SIRS CRITERIA  SIRS Temperature  0  SIRS Respirations  1  SIRS Pulse 1  SIRS WBC 1  SIRS Score Sum  3

## 2023-02-17 NOTE — Plan of Care (Signed)
  Problem: Fluid Volume: Goal: Ability to maintain a balanced intake and output will improve Outcome: Progressing   Problem: Skin Integrity: Goal: Risk for impaired skin integrity will decrease Outcome: Progressing   

## 2023-02-18 ENCOUNTER — Inpatient Hospital Stay (HOSPITAL_COMMUNITY): Payer: Medicare Other

## 2023-02-18 DIAGNOSIS — A419 Sepsis, unspecified organism: Secondary | ICD-10-CM | POA: Diagnosis not present

## 2023-02-18 LAB — CBC WITH DIFFERENTIAL/PLATELET
Abs Immature Granulocytes: 0 10*3/uL (ref 0.00–0.07)
Basophils Absolute: 0 10*3/uL (ref 0.0–0.1)
Basophils Relative: 0 %
Eosinophils Absolute: 0.1 10*3/uL (ref 0.0–0.5)
Eosinophils Relative: 1 %
HCT: 30.5 % — ABNORMAL LOW (ref 36.0–46.0)
Hemoglobin: 10 g/dL — ABNORMAL LOW (ref 12.0–15.0)
Lymphocytes Relative: 16 %
Lymphs Abs: 2.3 10*3/uL (ref 0.7–4.0)
MCH: 30.4 pg (ref 26.0–34.0)
MCHC: 32.8 g/dL (ref 30.0–36.0)
MCV: 92.7 fL (ref 80.0–100.0)
Monocytes Absolute: 0.6 10*3/uL (ref 0.1–1.0)
Monocytes Relative: 4 %
Neutro Abs: 11.3 10*3/uL — ABNORMAL HIGH (ref 1.7–7.7)
Neutrophils Relative %: 79 %
Platelets: 201 10*3/uL (ref 150–400)
RBC: 3.29 MIL/uL — ABNORMAL LOW (ref 3.87–5.11)
RDW: 13.9 % (ref 11.5–15.5)
WBC: 14.3 10*3/uL — ABNORMAL HIGH (ref 4.0–10.5)
nRBC: 0 % (ref 0.0–0.2)
nRBC: 0 /100{WBCs}

## 2023-02-18 LAB — URINE CULTURE: Culture: >=100000 — AB

## 2023-02-18 LAB — COMPREHENSIVE METABOLIC PANEL
ALT: 20 U/L (ref 0–44)
AST: 22 U/L (ref 15–41)
Albumin: 2.1 g/dL — ABNORMAL LOW (ref 3.5–5.0)
Alkaline Phosphatase: 47 U/L (ref 38–126)
Anion gap: 11 (ref 5–15)
BUN: 5 mg/dL — ABNORMAL LOW (ref 8–23)
CO2: 25 mmol/L (ref 22–32)
Calcium: 6.9 mg/dL — ABNORMAL LOW (ref 8.9–10.3)
Chloride: 105 mmol/L (ref 98–111)
Creatinine, Ser: 0.53 mg/dL (ref 0.44–1.00)
GFR, Estimated: >60 mL/min (ref >60)
Glucose, Bld: 115 mg/dL — ABNORMAL HIGH (ref 70–99)
Potassium: 2.5 mmol/L — CL (ref 3.5–5.1)
Sodium: 141 mmol/L (ref 135–145)
Total Bilirubin: 0.5 mg/dL (ref 0.0–1.2)
Total Protein: 5.2 g/dL — ABNORMAL LOW (ref 6.5–8.1)

## 2023-02-18 LAB — PROCALCITONIN: Procalcitonin: <0.1 ng/mL

## 2023-02-18 LAB — AMMONIA: Ammonia: 19 umol/L (ref 9–35)

## 2023-02-18 LAB — GLUCOSE, CAPILLARY
Glucose-Capillary: 112 mg/dL — ABNORMAL HIGH (ref 70–99)
Glucose-Capillary: 153 mg/dL — ABNORMAL HIGH (ref 70–99)
Glucose-Capillary: 147 mg/dL — ABNORMAL HIGH (ref 70–99)
Glucose-Capillary: 94 mg/dL (ref 70–99)

## 2023-02-18 LAB — T4, FREE: Free T4: 1.78 ng/dL — ABNORMAL HIGH (ref 0.61–1.12)

## 2023-02-18 LAB — MAGNESIUM: Magnesium: 1.8 mg/dL (ref 1.7–2.4)

## 2023-02-18 LAB — BRAIN NATRIURETIC PEPTIDE: B Natriuretic Peptide: 776.4 pg/mL — ABNORMAL HIGH (ref 0.0–100.0)

## 2023-02-18 LAB — C-REACTIVE PROTEIN: CRP: 6.1 mg/dL — ABNORMAL HIGH (ref <1.0)

## 2023-02-18 LAB — PHOSPHORUS: Phosphorus: 2.2 mg/dL — ABNORMAL LOW (ref 2.5–4.6)

## 2023-02-18 LAB — TSH: TSH: 0.213 u[IU]/mL — ABNORMAL LOW (ref 0.350–4.500)

## 2023-02-18 MED ORDER — DEXTROSE 5 % IV SOLN
30.0000 mmol | Freq: Once | INTRAVENOUS | Status: AC
  Administered 2023-02-18: 30 mmol via INTRAVENOUS
  Filled 2023-02-18: qty 10

## 2023-02-18 MED ORDER — METOPROLOL TARTRATE 50 MG PO TABS
50.0000 mg | ORAL_TABLET | Freq: Two times a day (BID) | ORAL | Status: DC
  Administered 2023-02-18 – 2023-02-23 (×11): 50 mg via ORAL
  Filled 2023-02-18 (×11): qty 1

## 2023-02-18 MED ORDER — POTASSIUM CHLORIDE 20 MEQ PO PACK
40.0000 meq | PACK | Freq: Once | ORAL | Status: AC
  Administered 2023-02-18: 40 meq via ORAL
  Filled 2023-02-18: qty 2

## 2023-02-18 MED ORDER — POTASSIUM CHLORIDE 10 MEQ/100ML IV SOLN
10.0000 meq | INTRAVENOUS | Status: AC
  Administered 2023-02-18 (×6): 10 meq via INTRAVENOUS
  Filled 2023-02-18 (×6): qty 100

## 2023-02-18 NOTE — Progress Notes (Signed)
   02/18/23 1610  Provider Notification  Provider Name/Title Dr Thedore Mins  Date Provider Notified 02/18/23  Time Provider Notified [phone]  Method of Notification  (secure chat)  Notification Reason Critical Result  Test performed and critical result potassium  Date Critical Result Received 02/18/23  Time Critical Result Received 0900  Provider response See new orders  Date of Provider Response 02/18/23  Time of Provider Response 0910   K= 2.5 MD made aware. New orders placed.

## 2023-02-18 NOTE — Progress Notes (Signed)
PROGRESS NOTE                                                                                                                                                                                                             Patient Demographics:    Katherine [name], is a 78 y.o. female, DOB - 04-29-1945, WGN:[id number]  Outpatient Primary MD for the patient is Ronnald Collum    LOS - 4  Admit date - 02/14/2023    Chief Complaint  Patient presents with   Code Sepsis       Brief Narrative (HPI from H&P)   78 year old female with history of coronary disease status post PCI, diabetes type 2, TAVR, dementia with behavioral disturbances, GERD who presented from skilled nursing facility with complaint of shortness of breath, fever, cough, confusion increased from baseline.  On presentation, she was hypoxic requiring 2 L of oxygen, febrile, hypotensive.  She was diagnosed with sepsis due to UTI and pneumonia and admitted to the hospital.   Subjective:   Patient in bed, appears comfortable, denies any headache, no fever, no chest pain or pressure, no shortness of breath , no abdominal pain. No focal weakness.   Assessment  & Plan :   Sepsis secondary to UTI present on admission along with pneumonia: Being treated with empiric IV antibiotics, IV fluids, sepsis pathophysiology has improved, will be seen by speech therapy, follow cultures, blood cultures 1 out of 2 appears to be contaminant. Will follow final urine culture and sensitivity data and monitor.  Clinically improving likely discharge in the next 1 to 2 days.   Acute hypoxic respiratory  failure: Secondary to pneumonia/recent COVID.  On 2 L.  Continue to wean.  Clinically much improved.   Hypokalemia, hypomagnesemia, hypophosphatemia: Replaced.   History of coronary artery disease: Status post PCI.  Metoprolol on hold due to hypotension.  On Crestor, Eliquis   Acute  metabolic encephalopathy: Secondary to sepsis/pneumonia.  CT head did not show any acute findings.   History of dementia: On Risperdal, Cogentin, Namenda.  Frequent reorientation, delirium precautions   Hypertension: Home metoprolol, hydrochlorothiazide on hold due to hypotension   GERD: Continue PPI   Paroxysmal A-fib: Takes metoprolol, Eliquis.  Monitor on telemetry   Hypohyroidism.  TSH suppressed, free T4 elevated, hold Synthroid here upon discharge cut down the home  dose  Diabetes type 2: Takes metformin.  Currently on sliding scale.  Monitor blood sugars  CBG (last 3)  Recent Labs    02/17/23 1155 02/17/23 1814 02/18/23 0753  GLUCAP 177* 158* 112*         Condition - Fair  Family Communication  : Updated niece Lupita Leash [phone]   in detail on 02/16/2023  Code Status :  DNR  Consults  :  None  PUD Prophylaxis :  PPI   Procedures  :            Disposition Plan  :    Status is: Inpatient  DVT Prophylaxis  :     apixaban (ELIQUIS) tablet 5 mg     Lab Results  Component Value Date   PLT 201 02/18/2023    Diet :  Diet Order             DIET DYS 3 Room service appropriate? Yes; Fluid consistency: Thin  Diet effective now                    Inpatient Medications  Scheduled Meds:  apixaban  5 mg Oral BID   benztropine  0.5 mg Oral BID   memantine  28 mg Oral Daily   And   donepezil  10 mg Oral Daily   insulin aspart  0-9 Units Subcutaneous TID WC   magnesium oxide  400 mg Oral Daily   metoprolol tartrate  50 mg Oral BID   pantoprazole  40 mg Oral Daily   potassium chloride  40 mEq Oral Once   potassium chloride  40 mEq Oral Once   risperiDONE  2 mg Oral QHS   rosuvastatin  40 mg Oral QHS   zinc oxide  1 Application Topical BID   Continuous Infusions:  cefTRIAXone (ROCEPHIN)  IV 2 g (02/17/23 1245)   metronidazole 500 mg (02/18/23 0916)   potassium chloride     sodium PHOSPHATE IVPB (in mmol)     PRN Meds:.acetaminophen **OR**  acetaminophen, diltiazem, metoprolol tartrate   Objective:   Vitals:   02/18/23 0356 02/18/23 0500 02/18/23 0609 02/18/23 0700  BP: (!) 143/96  129/78 (!) 135/104  Pulse: 80  (!) 118 (!) 40  Resp: 18 18  19   Temp: 98.9 F (37.2 C)     TempSrc: Axillary     SpO2: 95%   95%  Weight:      Height:        Wt Readings from Last 3 Encounters:  02/14/23 57.8 kg  02/04/23 55 kg  06/15/21 56 kg     Intake/Output Summary (Last 24 hours) at 02/18/2023 0934 Last data filed at 02/18/2023 0543 Gross per 24 hour  Intake 2012.27 ml  Output 1100 ml  Net 912.27 ml     Physical Exam  Awake but confused, No new F.N deficits, Normal affect [place].AT,PERRAL Supple Neck, No JVD,   Symmetrical Chest wall movement, Good air movement bilaterally, CTAB RRR,No Gallops,Rubs or new Murmurs,  +ve B.Sounds, Abd Soft, No tenderness,   No Cyanosis, Clubbing or edema     RN pressure injury documentation: Pressure Injury 02/15/23 Buttocks Medial;Right Deep Tissue Pressure Injury - Purple or maroon localized area of discolored intact skin or blood-filled blister due to damage of underlying soft tissue from pressure and/or shear. evolving dtpi 3 cm x 3 cm w (Active)  02/15/23   Location: Buttocks  Location Orientation: Medial;Right  Staging: Deep Tissue Pressure Injury - Purple or maroon localized  area of discolored intact skin or blood-filled blister due to damage of underlying soft tissue from pressure and/or shear.  Wound Description (Comments): evolving dtpi 3 cm x 3 cm with partial thickness skin loss  Present on Admission: Yes  Dressing Type Foam - Lift dressing to assess site every shift (xeroform) 02/18/23 0743      Data Review:    Recent Labs  Lab 02/14/23 2130 02/15/23 0438 02/16/23 0606 02/17/23 0632 02/18/23 0806  WBC 22.0* 20.8* 15.4* 13.7* 14.3*  HGB 12.5 11.2* 9.9* 10.2* 10.0*  HCT 39.5 35.4* 31.6* 31.5* 30.5*  PLT 341 273 259 252 201  MCV 95.9 96.5 96.3 94.6 92.7  MCH 30.3  30.5 30.2 30.6 30.4  MCHC 31.6 31.6 31.3 32.4 32.8  RDW 13.7 13.8 13.9 13.9 13.9  LYMPHSABS 1.4  --   --  1.1 2.3  MONOABS 1.2*  --   --  0.8 0.6  EOSABS 0.0  --   --  0.1 0.1  BASOSABS 0.0  --   --  0.0 0.0    Recent Labs  Lab 02/14/23 2130 02/14/23 2225 02/15/23 0438 02/15/23 0452 02/15/23 0657 02/15/23 1626 02/15/23 2011 02/16/23 0606 02/16/23 0827 02/16/23 1036 02/17/23 0632 02/18/23 0806  NA 144  --  146*  --   --   --   --  148*  --   --  144 141  K 3.4*  --  2.9*  --   --  3.1*  --  3.4*  --   --  3.1* 2.5*  CL 104  --  109  --   --   --   --  114*  --   --  112* 105  CO2 23  --  23  --   --   --   --  22  --   --  23 25  ANIONGAP 17*  --  14  --   --   --   --  12  --   --  9 11  GLUCOSE 197*  --  147*  --   --   --   --  126*  --   --  144* 115*  BUN 27*  --  27*  --   --   --   --  21  --   --  11 5*  CREATININE 0.95  --  0.93  --   --   --   --  0.60  --   --  0.61 0.53  AST 21  --   --   --   --   --   --  20  --   --  20 22  ALT 18  --   --   --   --   --   --  17  --   --  17 20  ALKPHOS 61  --   --   --   --   --   --  52  --   --  52 47  BILITOT 1.1  --   --   --   --   --   --  0.8  --   --  0.6 0.5  ALBUMIN 3.1*  --   --   --   --   --   --  2.3*  --   --  2.3* 2.1*  CRP  --   --   --   --   --   --   --  18.0*  --   --  10.8* 6.1*  PROCALCITON  --   --   --   --   --   --   --  0.34  --   --  0.15  --   LATICACIDVEN  --  1.6  --   --   --   --   --   --   --   --   --   --   INR  --   --  1.6*  --   --   --  1.6*  --   --   --   --   --   TSH  --   --   --  0.321*  --   --   --   --   --  0.311*  --  0.213*  HGBA1C  --   --  5.9*  --   --   --   --   --   --   --   --   --   AMMONIA  --   --   --   --   --   --   --  15  --   --  24 19  BNP  --   --   --   --   --   --   --   --  548.5*  --  1,178.7* 776.4*  MG  --   --   --   --  1.5*  --   --  2.0  --   --  1.5* 1.8  PHOS  --   --   --   --   --   --   --  1.7*  --   --  2.4* 2.2*  CALCIUM 9.3  --   8.4*  --   --   --   --  8.1*  --   --  7.5* 6.9*      Recent Labs  Lab 02/14/23 2130 02/14/23 2225 02/15/23 0438 02/15/23 0452 02/15/23 0657 02/15/23 2011 02/16/23 0606 02/16/23 0827 02/16/23 1036 02/17/23 0632 02/18/23 0806  CRP  --   --   --   --   --   --  18.0*  --   --  10.8* 6.1*  PROCALCITON  --   --   --   --   --   --  0.34  --   --  0.15  --   LATICACIDVEN  --  1.6  --   --   --   --   --   --   --   --   --   INR  --   --  1.6*  --   --  1.6*  --   --   --   --   --   TSH  --   --   --  0.321*  --   --   --   --  0.311*  --  0.213*  HGBA1C  --   --  5.9*  --   --   --   --   --   --   --   --   AMMONIA  --   --   --   --   --   --  15  --   --  24 19  BNP  --   --   --   --   --   --   --  548.5*  --  1,178.7* 776.4*  MG  --   --   --   --  1.5*  --  2.0  --   --  1.5* 1.8  CALCIUM 9.3  --  8.4*  --   --   --  8.1*  --   --  7.5* 6.9*    --------------------------------------------------------------------------------------------------------------- Lab Results  Component Value Date   CHOL 122 10/27/2019   HDL 50 10/27/2019   LDLCALC 52 10/27/2019   LDLDIRECT 66.0 06/16/2014   TRIG 113 10/27/2019   CHOLHDL 2.4 10/27/2019    Lab Results  Component Value Date   HGBA1C 5.9 (H) 02/15/2023   Recent Labs    02/18/23 0806  TSH 0.213*  FREET4 1.78*     Micro Results Recent Results (from the past 240 hours)  Blood Culture (routine x 2)     Status: Abnormal   Collection Time: 02/14/23  9:33 PM   Specimen: BLOOD  Result Value Ref Range Status   Specimen Description BLOOD LEFT ANTECUBITAL  Final   Special Requests   Final    BOTTLES DRAWN AEROBIC AND ANAEROBIC Blood Culture results may not be optimal due to an inadequate volume of blood received in culture bottles   Culture  Setup Time   Final    GRAM POSITIVE COCCI IN CLUSTERS AEROBIC BOTTLE ONLY CRITICAL RESULT CALLED TO, READ BACK BY AND VERIFIED WITH: PHARMD CAREN AMEND ON 02/15/23 @ 1652 BY DRT     Culture (A)  Final    STAPHYLOCOCCUS HOMINIS UNABLE TO ISOLATE STAPHYLOCOCCUS EPIDERMIDIS WHICH WAS IDENTIFIED AS PRESENT BY [facility]. THE SIGNIFICANCE OF ISOLATING THIS ORGANISM FROM A SINGLE SET OF BLOOD CULTURES WHEN MULTIPLE SETS ARE DRAWN IS UNCERTAIN. PLEASE NOTIFY THE MICROBIOLOGY DEPARTMENT WITHIN ONE WEEK IF SPECIATION AND SENSITIVITIES ARE REQUIRED. Performed at [facility], [address] [place], [place] [address]    Report Status 02/17/2023 FINAL  Final  Blood Culture ID Panel (Reflexed)     Status: Abnormal   Collection Time: 02/14/23  9:33 PM  Result Value Ref Range Status   Enterococcus faecalis NOT DETECTED NOT DETECTED Final   Enterococcus Faecium NOT DETECTED NOT DETECTED Final   Listeria monocytogenes NOT DETECTED NOT DETECTED Final   Staphylococcus species DETECTED (A) NOT DETECTED Final    Comment: CRITICAL RESULT CALLED TO, READ BACK BY AND VERIFIED WITH: PHARMD CAREN AMEND ON 02/15/23 @ 1652 BY DRT    Staphylococcus aureus (BCID) NOT DETECTED NOT DETECTED Final   Staphylococcus epidermidis DETECTED (A) NOT DETECTED Final    Comment: Methicillin (oxacillin) resistant coagulase negative staphylococcus. Possible blood culture contaminant (unless isolated from more than one blood culture draw or clinical case suggests pathogenicity). No antibiotic treatment is indicated for blood  culture contaminants. CRITICAL RESULT CALLED TO, READ BACK BY AND VERIFIED WITH: PHARMD CAREN AMEND ON 02/15/23 @ 1652 BY DRT    Staphylococcus lugdunensis NOT DETECTED NOT DETECTED Final   Streptococcus species NOT DETECTED NOT DETECTED Final   Streptococcus agalactiae NOT DETECTED NOT DETECTED Final   Streptococcus pneumoniae NOT DETECTED NOT DETECTED Final   Streptococcus pyogenes NOT DETECTED NOT DETECTED Final   A.calcoaceticus-baumannii NOT DETECTED NOT DETECTED Final   Bacteroides fragilis NOT DETECTED NOT DETECTED Final   Enterobacterales NOT DETECTED NOT DETECTED Final    Enterobacter cloacae complex NOT DETECTED NOT DETECTED Final   Escherichia coli NOT DETECTED NOT DETECTED Final   Klebsiella aerogenes NOT DETECTED NOT DETECTED Final   Klebsiella oxytoca NOT  DETECTED NOT DETECTED Final   Klebsiella pneumoniae NOT DETECTED NOT DETECTED Final   Proteus species NOT DETECTED NOT DETECTED Final   Salmonella species NOT DETECTED NOT DETECTED Final   Serratia marcescens NOT DETECTED NOT DETECTED Final   Haemophilus influenzae NOT DETECTED NOT DETECTED Final   Neisseria meningitidis NOT DETECTED NOT DETECTED Final   Pseudomonas aeruginosa NOT DETECTED NOT DETECTED Final   Stenotrophomonas maltophilia NOT DETECTED NOT DETECTED Final   Candida albicans NOT DETECTED NOT DETECTED Final   Candida auris NOT DETECTED NOT DETECTED Final   Candida glabrata NOT DETECTED NOT DETECTED Final   Candida krusei NOT DETECTED NOT DETECTED Final   Candida parapsilosis NOT DETECTED NOT DETECTED Final   Candida tropicalis NOT DETECTED NOT DETECTED Final   Cryptococcus neoformans/gattii NOT DETECTED NOT DETECTED Final   Methicillin resistance mecA/C DETECTED (A) NOT DETECTED Final    Comment: CRITICAL RESULT CALLED TO, READ BACK BY AND VERIFIED WITH: PHARMD CAREN AMEND ON 02/15/23 @ 1652 BY DRT Performed at [facility], [address], [place], [place] [address]   Resp panel by RT-PCR (RSV, Flu A&B, Covid) Anterior Nasal Swab     Status: None   Collection Time: 02/14/23 10:41 PM   Specimen: Anterior Nasal Swab  Result Value Ref Range Status   SARS Coronavirus 2 by RT PCR NEGATIVE NEGATIVE Final   Influenza A by PCR NEGATIVE NEGATIVE Final   Influenza B by PCR NEGATIVE NEGATIVE Final    Comment: (NOTE) The Xpert Xpress SARS-CoV-2/FLU/RSV plus assay is intended as an aid in the diagnosis of influenza from Nasopharyngeal swab specimens and should not be used as a sole basis for treatment. Nasal washings and aspirates are unacceptable for Xpert Xpress  SARS-CoV-2/FLU/RSV testing.  Fact Sheet for Patients: BloggerCourse.com  Fact Sheet for Healthcare Providers: SeriousBroker.it  This test is not yet approved or cleared by the [place] FDA and has been authorized for detection and/or diagnosis of SARS-CoV-2 by FDA under an Emergency Use Authorization (EUA). This EUA will remain in effect (meaning this test can be used) for the duration of the COVID-19 declaration under Section 564(b)(1) of the Act, 21 U.S.C. section 360bbb-3(b)(1), unless the authorization is terminated or revoked.     Resp Syncytial Virus by PCR NEGATIVE NEGATIVE Final    Comment: (NOTE) Fact Sheet for Patients: BloggerCourse.com  Fact Sheet for Healthcare Providers: SeriousBroker.it  This test is not yet approved or cleared by the [place] FDA and has been authorized for detection and/or diagnosis of SARS-CoV-2 by FDA under an Emergency Use Authorization (EUA). This EUA will remain in effect (meaning this test can be used) for the duration of the COVID-19 declaration under Section 564(b)(1) of the Act, 21 U.S.C. section 360bbb-3(b)(1), unless the authorization is terminated or revoked.  Performed at [facility], [address] [place], [place] [address]   Urine Culture     Status: Abnormal (Preliminary result)   Collection Time: 02/14/23 10:41 PM   Specimen: Urine, Catheterized  Result Value Ref Range Status   Specimen Description URINE, CATHETERIZED  Final   Special Requests NONE Reflexed from [id number]  Final   Culture (A)  Final    >=100,000 COLONIES/mL GRAM NEGATIVE RODS CONFIRMATION OF SUSCEPTIBILITIES IN PROGRESS Performed at [facility], [address] [place], [place] [address]    Report Status PENDING  Incomplete  Blood Culture (routine x 2)     Status: None (Preliminary result)   Collection Time: 02/14/23 10:43  PM    Specimen: BLOOD  Result Value Ref Range Status   Specimen Description BLOOD SITE NOT SPECIFIED  Final   Special Requests   Final    BOTTLES DRAWN AEROBIC AND ANAEROBIC Blood Culture adequate volume   Culture   Final    NO GROWTH 3 DAYS Performed at [facility], [address], [place], [place] [address]    Report Status PENDING  Incomplete  MRSA Next Gen by PCR, Nasal     Status: None   Collection Time: 02/15/23  4:26 PM   Specimen: Nasal Mucosa; Nasal Swab  Result Value Ref Range Status   MRSA by PCR Next Gen NOT DETECTED NOT DETECTED Final    Comment: (NOTE) The GeneXpert MRSA Assay (FDA approved for NASAL specimens only), is one component of a comprehensive MRSA colonization surveillance program. It is not intended to diagnose MRSA infection nor to guide or monitor treatment for MRSA infections. Test performance is not FDA approved in patients less than 73 years old. Performed at [facility], [address] [place], [place] [address]     Radiology Reports DG Abd Portable 1V Result Date: 02/18/2023 CLINICAL DATA:  78 year old female with history of constipation. EXAM: PORTABLE ABDOMEN - 1 VIEW COMPARISON:  No priors. FINDINGS: Gas and stool are seen scattered throughout the colon extending to the level of the distal rectum. No pathologic distension of small bowel is noted. No gross evidence of pneumoperitoneum. Markers from surgical mash project over the right side of the abdomen, presumably from remote hernia repair. Multiple old healed fractures of the superior and inferior pubic rami bilaterally. Vascular calcifications are noted. IMPRESSION: 1. Nonobstructive bowel gas pattern. 2. Aortic atherosclerosis. Electronically Signed   By: Trudie Reed M.D.   On: 02/18/2023 07:42   DG Chest Port 1 View Result Date: 02/18/2023 CLINICAL DATA:  78 year old female with history of shortness of breath. EXAM: PORTABLE CHEST 1 VIEW COMPARISON:  Chest x-ray 02/16/2023.  FINDINGS: Lung volumes are very low. No definite consolidative airspace disease. No definite pleural effusions. No pneumothorax. No evidence of pulmonary edema. Heart size appears borderline enlarged. The patient is rotated to the left on today's exam, resulting in distortion of the mediastinal contours and reduced diagnostic sensitivity and specificity for mediastinal pathology. Atherosclerotic calcifications are noted in the thoracic aorta. Status post TAVR. IMPRESSION: 1. Low lung volumes without radiographic evidence of acute cardiopulmonary disease. 2. Aortic atherosclerosis. Electronically Signed   By: Trudie Reed M.D.   On: 02/18/2023 07:40   DG Chest Port 1 View Result Date: 02/16/2023 CLINICAL DATA:  Short of breath. EXAM: PORTABLE CHEST 1 VIEW COMPARISON:  02/14/2023 and older exams.  CT, 04/03/2019. FINDINGS: Cardiac silhouette mostly obscured by the left hemidiaphragm. Stable changes from previous aortic valve replacement. No mediastinal or hilar masses. Lung volumes are low. There is mild opacity at the left lung base consistent with atelectasis. No convincing pneumonia or overt pulmonary edema. No pneumothorax. IMPRESSION: 1. Mild facet the left lung base, improved from the most recent prior exam, consistent with atelectasis. No convincing acute cardiopulmonary disease. Electronically Signed   By: Amie Portland M.D.   On: 02/16/2023 07:53   CT HEAD WO CONTRAST ( ) Result Date: 02/15/2023 CLINICAL DATA:  Hypoxia and lethargy in the setting of COVID. Mental status change with unknown cause. EXAM: CT HEAD WITHOUT CONTRAST TECHNIQUE: Contiguous axial images were obtained from the base of the skull through the vertex without intravenous contrast. RADIATION DOSE REDUCTION: This  exam was performed according to the departmental dose-optimization program which includes automated exposure control, adjustment of the mA and/or kV according to patient size and/or use of iterative reconstruction  technique. COMPARISON:  Brain MRI 02/05/2023 FINDINGS: Brain: No evidence of acute infarction, hemorrhage, hydrocephalus, extra-axial collection or mass lesion/mass effect. Bands of streak artifact best seen on reformats. Mild chronic small vessel disease in the cerebral white matter for age. Vascular: No hyperdense vessel or unexpected calcification. Skull: Normal. Negative for fracture or focal lesion. Sinuses/Orbits: No acute finding. IMPRESSION: Aging brain without acute or interval finding. Electronically Signed   By: Tiburcio Pea M.D.   On: 02/15/2023 05:31   DG Chest Port 1 View Result Date: 02/14/2023 CLINICAL DATA:  Questionable sepsis - evaluate for abnormality EXAM: PORTABLE CHEST 1 VIEW COMPARISON:  02/04/2023 FINDINGS: Low lung volumes persist. Stable heart size and mediastinal contours. TAVR streaky opacity in the left greater than right lung base of increased from prior exam. No pulmonary edema. No pneumothorax or large pleural effusion. First right shoulder arthroplasty. Remote left rib fractures. IMPRESSION: Low lung volumes with streaky opacity in the left greater than right lung base, increased from prior exam, may represent atelectasis or pneumonia. Electronically Signed   By: Narda Rutherford M.D.   On: 02/14/2023 21:52      Signature  -   Susa Raring M.D on 02/18/2023 at 9:34 AM   -  To page go to [email or web address]

## 2023-02-18 NOTE — Plan of Care (Signed)
Pt has rested quietly throughout the night with no distress noted. Alert and oriented to self. On room air. A-fib on the monitor. [place] intact to suction. Was incontinent once with large urine. Cleaned and linens changed. HR increased after cleaning pt. Lopressor given per MD order. EKG done. No complaints voiced.     Problem: Clinical Measurements: Goal: Ability to maintain clinical measurements within normal limits will improve Outcome: Progressing Goal: Respiratory complications will improve Outcome: Progressing Goal: Cardiovascular complication will be avoided Outcome: Progressing   Problem: Nutrition: Goal: Adequate nutrition will be maintained Outcome: Progressing   Problem: Pain Managment: Goal: General experience of comfort will improve and/or be controlled Outcome: Progressing

## 2023-02-19 DIAGNOSIS — A419 Sepsis, unspecified organism: Secondary | ICD-10-CM | POA: Diagnosis not present

## 2023-02-19 LAB — CBC WITH DIFFERENTIAL/PLATELET
Abs Immature Granulocytes: 0.09 10*3/uL — ABNORMAL HIGH (ref 0.00–0.07)
Basophils Absolute: 0.1 10*3/uL (ref 0.0–0.1)
Basophils Relative: 1 %
Eosinophils Absolute: 0.2 10*3/uL (ref 0.0–0.5)
Eosinophils Relative: 1 %
HCT: 29.6 % — ABNORMAL LOW (ref 36.0–46.0)
Hemoglobin: 9.5 g/dL — ABNORMAL LOW (ref 12.0–15.0)
Immature Granulocytes: 1 %
Lymphocytes Relative: 14 %
Lymphs Abs: 1.8 10*3/uL (ref 0.7–4.0)
MCH: 30.3 pg (ref 26.0–34.0)
MCHC: 32.1 g/dL (ref 30.0–36.0)
MCV: 94.3 fL (ref 80.0–100.0)
Monocytes Absolute: 0.5 10*3/uL (ref 0.1–1.0)
Monocytes Relative: 4 %
Neutro Abs: 9.9 10*3/uL — ABNORMAL HIGH (ref 1.7–7.7)
Neutrophils Relative %: 79 %
Platelets: 189 10*3/uL (ref 150–400)
RBC: 3.14 MIL/uL — ABNORMAL LOW (ref 3.87–5.11)
RDW: 14 % (ref 11.5–15.5)
WBC: 12.5 10*3/uL — ABNORMAL HIGH (ref 4.0–10.5)
nRBC: 0 % (ref 0.0–0.2)

## 2023-02-19 LAB — COMPREHENSIVE METABOLIC PANEL
ALT: 23 U/L (ref 0–44)
AST: 29 U/L (ref 15–41)
Albumin: 2.1 g/dL — ABNORMAL LOW (ref 3.5–5.0)
Alkaline Phosphatase: 43 U/L (ref 38–126)
Anion gap: 9 (ref 5–15)
BUN: 7 mg/dL — ABNORMAL LOW (ref 8–23)
CO2: 22 mmol/L (ref 22–32)
Calcium: 7.2 mg/dL — ABNORMAL LOW (ref 8.9–10.3)
Chloride: 109 mmol/L (ref 98–111)
Creatinine, Ser: 0.62 mg/dL (ref 0.44–1.00)
GFR, Estimated: >60 mL/min (ref >60)
Glucose, Bld: 109 mg/dL — ABNORMAL HIGH (ref 70–99)
Potassium: 3.9 mmol/L (ref 3.5–5.1)
Sodium: 140 mmol/L (ref 135–145)
Total Bilirubin: 0.4 mg/dL (ref 0.0–1.2)
Total Protein: 5.2 g/dL — ABNORMAL LOW (ref 6.5–8.1)

## 2023-02-19 LAB — GLUCOSE, CAPILLARY
Glucose-Capillary: 101 mg/dL — ABNORMAL HIGH (ref 70–99)
Glucose-Capillary: 191 mg/dL — ABNORMAL HIGH (ref 70–99)
Glucose-Capillary: 122 mg/dL — ABNORMAL HIGH (ref 70–99)
Glucose-Capillary: 121 mg/dL — ABNORMAL HIGH (ref 70–99)

## 2023-02-19 LAB — BRAIN NATRIURETIC PEPTIDE: B Natriuretic Peptide: 362.3 pg/mL — ABNORMAL HIGH (ref 0.0–100.0)

## 2023-02-19 LAB — PROCALCITONIN: Procalcitonin: <0.1 ng/mL

## 2023-02-19 LAB — MAGNESIUM: Magnesium: 1.5 mg/dL — ABNORMAL LOW (ref 1.7–2.4)

## 2023-02-19 LAB — CULTURE, BLOOD (ROUTINE X 2)
Culture: NO GROWTH
Special Requests: ADEQUATE

## 2023-02-19 LAB — C-REACTIVE PROTEIN: CRP: 3.9 mg/dL — ABNORMAL HIGH (ref <1.0)

## 2023-02-19 LAB — AMMONIA: Ammonia: 28 umol/L (ref 9–35)

## 2023-02-19 LAB — PHOSPHORUS: Phosphorus: 2.9 mg/dL (ref 2.5–4.6)

## 2023-02-19 MED ORDER — MAGNESIUM SULFATE 4 GM/100ML IV SOLN
4.0000 g | Freq: Once | INTRAVENOUS | Status: AC
  Administered 2023-02-19: 4 g via INTRAVENOUS
  Filled 2023-02-19: qty 100

## 2023-02-19 MED ORDER — SODIUM CHLORIDE 0.9 % IV SOLN
2.0000 g | Freq: Two times a day (BID) | INTRAVENOUS | Status: DC
  Administered 2023-02-19 (×2): 2 g via INTRAVENOUS
  Filled 2023-02-19 (×4): qty 40

## 2023-02-19 NOTE — TOC Progression Note (Signed)
Transition of Care [facility]) - Progression Note    Patient Details  Name: Katherine [name] MRN: [id number] Date of Birth: 04/06/1945  [facility]) CM/SW Contact  Mearl Latin, LCSW Phone Number: 02/19/2023, 9:01 AM  Clinical Narrative:    CSW continuing to follow for return to [facility] at discharge.    Expected Discharge Plan: Skilled Nursing Facility Barriers to Discharge: Continued Medical Work up  Expected Discharge Plan and Services In-house Referral: Clinical Social Work   Post Acute Care Choice: Skilled Nursing Facility Living arrangements for the past 2 months: Skilled Nursing Facility                                       Social Determinants of Health (SDOH) Interventions SDOH Screenings   Food Insecurity: Patient Unable To Answer (02/16/2023)  Housing: Patient Unable To Answer (02/16/2023)  Transportation Needs: Patient Unable To Answer (02/16/2023)  Utilities: Not At Risk (02/05/2023)  Depression (PHQ2-9): Low Risk  (08/28/2019)  Social Connections: Patient Unable To Answer (02/16/2023)  Recent Concern: Social Connections - Socially Isolated (02/05/2023)  Tobacco Use: Low Risk  (02/15/2023)    Readmission Risk Interventions     No data to display

## 2023-02-19 NOTE — Progress Notes (Signed)
PROGRESS NOTE                                                                                                                                                                                                             Patient Demographics:    Katherine [name], is a 78 y.o. female, DOB - 07/10/1945, ZOX:[id number]  Outpatient Primary MD for the patient is Ronnald Collum    LOS - 5  Admit date - 02/14/2023    Chief Complaint  Patient presents with   Code Sepsis       Brief Narrative (HPI from H&P)   78 year old female with history of coronary disease status post PCI, diabetes type 2, TAVR, dementia with behavioral disturbances, GERD who presented from skilled nursing facility with complaint of shortness of breath, fever, cough, confusion increased from baseline.  On presentation, she was hypoxic requiring 2 L of oxygen, febrile, hypotensive.  She was diagnosed with sepsis due to UTI and pneumonia and admitted to the hospital.   Subjective:   Patient in bed, appears comfortable, denies any headache, no fever, no chest pain or pressure, no shortness of breath , no abdominal pain. No focal weakness.   Assessment  & Plan :   Sepsis secondary to UTI present on admission along with pneumonia: Being treated with empiric IV antibiotics, IV fluids, sepsis pathophysiology has improved, will be seen by speech therapy, follow cultures, blood cultures 1 out of 2 appears to be contaminant.  Urine E. coli appears to be pan resistant, switched to carbapenem on 02/19/2023, currently clinically so far she has showed serial improvement.  Will give antibiotics for at least 5 days.   Acute hypoxic respiratory  failure: Secondary to pneumonia/recent COVID.  On 2 L.  Continue to wean.  Clinically much improved.   Hypokalemia, hypomagnesemia, hypophosphatemia: Replaced.   History of coronary artery disease: Status post PCI.  Metoprolol on hold  due to hypotension.  On Crestor, Eliquis   Acute metabolic encephalopathy: Secondary to sepsis/pneumonia.  CT head did not show any acute findings.   History of dementia: On Risperdal, Cogentin, Namenda.  Frequent reorientation, delirium precautions   Hypertension: Home metoprolol, hydrochlorothiazide on hold due to hypotension   GERD: Continue PPI   Paroxysmal A-fib: Takes metoprolol, Eliquis.  Monitor on telemetry   Hypohyroidism.  TSH suppressed, free T4  elevated, hold Synthroid here upon discharge cut down the home dose  Diabetes type 2: Takes metformin.  Currently on sliding scale.  Monitor blood sugars  CBG (last 3)  Recent Labs    02/18/23 1630 02/18/23 2105 02/19/23 0809  GLUCAP 147* 94 101*         Condition - Fair  Family Communication  : Updated niece Lupita Leash [phone]   in detail on 02/16/2023  Code Status :  DNR  Consults  :  None  PUD Prophylaxis :  PPI   Procedures  :            Disposition Plan  :    Status is: Inpatient  DVT Prophylaxis  :     apixaban (ELIQUIS) tablet 5 mg     Lab Results  Component Value Date   PLT 189 02/19/2023    Diet :  Diet Order             DIET DYS 3 Room service appropriate? Yes; Fluid consistency: Thin  Diet effective now                    Inpatient Medications  Scheduled Meds:  apixaban  5 mg Oral BID   benztropine  0.5 mg Oral BID   memantine  28 mg Oral Daily   And   donepezil  10 mg Oral Daily   insulin aspart  0-9 Units Subcutaneous TID WC   magnesium oxide  400 mg Oral Daily   metoprolol tartrate  50 mg Oral BID   pantoprazole  40 mg Oral Daily   risperiDONE  2 mg Oral QHS   rosuvastatin  40 mg Oral QHS   zinc oxide  1 Application Topical BID   Continuous Infusions:  cefTRIAXone (ROCEPHIN)  IV 2 g (02/18/23 1331)   magnesium sulfate bolus IVPB     metronidazole 500 mg (02/19/23 0847)   PRN Meds:.acetaminophen **OR** acetaminophen, diltiazem, metoprolol tartrate    Objective:   Vitals:   02/19/23 0000 02/19/23 0400 02/19/23 0800 02/19/23 0842  BP: 123/74 134/79 135/77 135/77  Pulse: 68 68 (!) 57 77  Resp: (!) 21 17 19    Temp: 99.3 F (37.4 C)  97.7 F (36.5 C)   TempSrc: Oral  Oral   SpO2: 99% 100% 98%   Weight:      Height:        Wt Readings from Last 3 Encounters:  02/14/23 57.8 kg  02/04/23 55 kg  06/15/21 56 kg     Intake/Output Summary (Last 24 hours) at 02/19/2023 1014 Last data filed at 02/19/2023 0300 Gross per 24 hour  Intake 931.95 ml  Output 700 ml  Net 231.95 ml     Physical Exam  Awake but confused, No new F.N deficits, Normal affect [place].AT,PERRAL Supple Neck, No JVD,   Symmetrical Chest wall movement, Good air movement bilaterally, CTAB RRR,No Gallops,Rubs or new Murmurs,  +ve B.Sounds, Abd Soft, No tenderness,   No Cyanosis, Clubbing or edema     RN pressure injury documentation: Pressure Injury 02/15/23 Buttocks Medial;Right Deep Tissue Pressure Injury - Purple or maroon localized area of discolored intact skin or blood-filled blister due to damage of underlying soft tissue from pressure and/or shear. evolving dtpi 3 cm x 3 cm w (Active)  02/15/23   Location: Buttocks  Location Orientation: Medial;Right  Staging: Deep Tissue Pressure Injury - Purple or maroon localized area of discolored intact skin or blood-filled blister due to damage of underlying soft  tissue from pressure and/or shear.  Wound Description (Comments): evolving dtpi 3 cm x 3 cm with partial thickness skin loss  Present on Admission: Yes  Dressing Type Foam - Lift dressing to assess site every shift 02/18/23 2000      Data Review:    Recent Labs  Lab 02/14/23 2130 02/15/23 0438 02/16/23 0606 02/17/23 0632 02/18/23 0806 02/19/23 0445  WBC 22.0* 20.8* 15.4* 13.7* 14.3* 12.5*  HGB 12.5 11.2* 9.9* 10.2* 10.0* 9.5*  HCT 39.5 35.4* 31.6* 31.5* 30.5* 29.6*  PLT 341 273 259 252 201 189  MCV 95.9 96.5 96.3 94.6 92.7 94.3  MCH 30.3 30.5  30.2 30.6 30.4 30.3  MCHC 31.6 31.6 31.3 32.4 32.8 32.1  RDW 13.7 13.8 13.9 13.9 13.9 14.0  LYMPHSABS 1.4  --   --  1.1 2.3 1.8  MONOABS 1.2*  --   --  0.8 0.6 0.5  EOSABS 0.0  --   --  0.1 0.1 0.2  BASOSABS 0.0  --   --  0.0 0.0 0.1    Recent Labs  Lab 02/14/23 2130 02/14/23 2225 02/15/23 0438 02/15/23 0452 02/15/23 0657 02/15/23 1626 02/15/23 2011 02/16/23 0606 02/16/23 0827 02/16/23 1036 02/17/23 0632 02/18/23 0806 02/19/23 0445  NA 144  --  146*  --   --   --   --  148*  --   --  144 141 140  K 3.4*  --  2.9*  --   --  3.1*  --  3.4*  --   --  3.1* 2.5* 3.9  CL 104  --  109  --   --   --   --  114*  --   --  112* 105 109  CO2 23  --  23  --   --   --   --  22  --   --  23 25 22   ANIONGAP 17*  --  14  --   --   --   --  12  --   --  9 11 9   GLUCOSE 197*  --  147*  --   --   --   --  126*  --   --  144* 115* 109*  BUN 27*  --  27*  --   --   --   --  21  --   --  11 5* 7*  CREATININE 0.95  --  0.93  --   --   --   --  0.60  --   --  0.61 0.53 0.62  AST 21  --   --   --   --   --   --  20  --   --  20 22 29   ALT 18  --   --   --   --   --   --  17  --   --  17 20 23   ALKPHOS 61  --   --   --   --   --   --  52  --   --  52 47 43  BILITOT 1.1  --   --   --   --   --   --  0.8  --   --  0.6 0.5 0.4  ALBUMIN 3.1*  --   --   --   --   --   --  2.3*  --   --  2.3* 2.1* 2.1*  CRP  --   --   --   --   --   --   --  18.0*  --   --  10.8* 6.1* 3.9*  PROCALCITON  --   --   --   --   --   --   --  0.34  --   --  0.15 <0.10 <0.10  LATICACIDVEN  --  1.6  --   --   --   --   --   --   --   --   --   --   --   INR  --   --  1.6*  --   --   --  1.6*  --   --   --   --   --   --   TSH  --   --   --  0.321*  --   --   --   --   --  0.311*  --  0.213*  --   HGBA1C  --   --  5.9*  --   --   --   --   --   --   --   --   --   --   AMMONIA  --   --   --   --   --   --   --  15  --   --  24 19 28   BNP  --   --   --   --   --   --   --   --  548.5*  --  1,178.7* 776.4* 362.3*  MG  --   --   --    --  1.5*  --   --  2.0  --   --  1.5* 1.8 1.5*  PHOS  --   --   --   --   --   --   --  1.7*  --   --  2.4* 2.2* 2.9  CALCIUM 9.3  --  8.4*  --   --   --   --  8.1*  --   --  7.5* 6.9* 7.2*      Recent Labs  Lab 02/14/23 2225 02/15/23 0438 02/15/23 0452 02/15/23 0657 02/15/23 2011 02/16/23 0606 02/16/23 0827 02/16/23 1036 02/17/23 0632 02/18/23 0806 02/19/23 0445  CRP  --   --   --   --   --  18.0*  --   --  10.8* 6.1* 3.9*  PROCALCITON  --   --   --   --   --  0.34  --   --  0.15 <0.10 <0.10  LATICACIDVEN 1.6  --   --   --   --   --   --   --   --   --   --   INR  --  1.6*  --   --  1.6*  --   --   --   --   --   --   TSH  --   --  0.321*  --   --   --   --  0.311*  --  0.213*  --   HGBA1C  --  5.9*  --   --   --   --   --   --   --   --   --   AMMONIA  --   --   --   --   --  15  --   --  24 19 28   BNP  --   --   --   --   --   --  548.5*  --  1,178.7* 776.4* 362.3*  MG  --   --   --  1.5*  --  2.0  --   --  1.5* 1.8 1.5*  CALCIUM  --  8.4*  --   --   --  8.1*  --   --  7.5* 6.9* 7.2*    --------------------------------------------------------------------------------------------------------------- Lab Results  Component Value Date   CHOL 122 10/27/2019   HDL 50 10/27/2019   LDLCALC 52 10/27/2019   LDLDIRECT 66.0 06/16/2014   TRIG 113 10/27/2019   CHOLHDL 2.4 10/27/2019    Lab Results  Component Value Date   HGBA1C 5.9 (H) 02/15/2023   Recent Labs    02/18/23 0806  TSH 0.213*  FREET4 1.78*     Micro Results Recent Results (from the past 240 hours)  Blood Culture (routine x 2)     Status: Abnormal   Collection Time: 02/14/23  9:33 PM   Specimen: BLOOD  Result Value Ref Range Status   Specimen Description BLOOD LEFT ANTECUBITAL  Final   Special Requests   Final    BOTTLES DRAWN AEROBIC AND ANAEROBIC Blood Culture results may not be optimal due to an inadequate volume of blood received in culture bottles   Culture  Setup Time   Final    GRAM POSITIVE  COCCI IN CLUSTERS AEROBIC BOTTLE ONLY CRITICAL RESULT CALLED TO, READ BACK BY AND VERIFIED WITH: PHARMD CAREN AMEND ON 02/15/23 @ 1652 BY DRT    Culture (A)  Final    STAPHYLOCOCCUS HOMINIS UNABLE TO ISOLATE STAPHYLOCOCCUS EPIDERMIDIS WHICH WAS IDENTIFIED AS PRESENT BY [facility]. THE SIGNIFICANCE OF ISOLATING THIS ORGANISM FROM A SINGLE SET OF BLOOD CULTURES WHEN MULTIPLE SETS ARE DRAWN IS UNCERTAIN. PLEASE NOTIFY THE MICROBIOLOGY DEPARTMENT WITHIN ONE WEEK IF SPECIATION AND SENSITIVITIES ARE REQUIRED. Performed at [facility], [address], [place], [place] [address]    Report Status 02/17/2023 FINAL  Final  Blood Culture ID Panel (Reflexed)     Status: Abnormal   Collection Time: 02/14/23  9:33 PM  Result Value Ref Range Status   Enterococcus faecalis NOT DETECTED NOT DETECTED Final   Enterococcus Faecium NOT DETECTED NOT DETECTED Final   Listeria monocytogenes NOT DETECTED NOT DETECTED Final   Staphylococcus species DETECTED (A) NOT DETECTED Final    Comment: CRITICAL RESULT CALLED TO, READ BACK BY AND VERIFIED WITH: PHARMD CAREN AMEND ON 02/15/23 @ 1652 BY DRT    Staphylococcus aureus (BCID) NOT DETECTED NOT DETECTED Final   Staphylococcus epidermidis DETECTED (A) NOT DETECTED Final    Comment: Methicillin (oxacillin) resistant coagulase negative staphylococcus. Possible blood culture contaminant (unless isolated from more than one blood culture draw or clinical case suggests pathogenicity). No antibiotic treatment is indicated for blood  culture contaminants. CRITICAL RESULT CALLED TO, READ BACK BY AND VERIFIED WITH: PHARMD CAREN AMEND ON 02/15/23 @ 1652 BY DRT    Staphylococcus lugdunensis NOT DETECTED NOT DETECTED Final   Streptococcus species NOT DETECTED NOT DETECTED Final   Streptococcus agalactiae NOT DETECTED NOT DETECTED Final   Streptococcus pneumoniae NOT DETECTED NOT DETECTED Final   Streptococcus pyogenes NOT DETECTED NOT DETECTED Final   A.calcoaceticus-baumannii  NOT DETECTED NOT DETECTED Final   Bacteroides fragilis NOT DETECTED NOT DETECTED Final   Enterobacterales NOT DETECTED NOT DETECTED Final   Enterobacter cloacae complex NOT DETECTED NOT DETECTED Final   Escherichia coli NOT DETECTED NOT DETECTED Final   Klebsiella aerogenes NOT DETECTED NOT DETECTED Final   Klebsiella oxytoca NOT DETECTED  NOT DETECTED Final   Klebsiella pneumoniae NOT DETECTED NOT DETECTED Final   Proteus species NOT DETECTED NOT DETECTED Final   Salmonella species NOT DETECTED NOT DETECTED Final   Serratia marcescens NOT DETECTED NOT DETECTED Final   Haemophilus influenzae NOT DETECTED NOT DETECTED Final   Neisseria meningitidis NOT DETECTED NOT DETECTED Final   Pseudomonas aeruginosa NOT DETECTED NOT DETECTED Final   Stenotrophomonas maltophilia NOT DETECTED NOT DETECTED Final   Candida albicans NOT DETECTED NOT DETECTED Final   Candida auris NOT DETECTED NOT DETECTED Final   Candida glabrata NOT DETECTED NOT DETECTED Final   Candida krusei NOT DETECTED NOT DETECTED Final   Candida parapsilosis NOT DETECTED NOT DETECTED Final   Candida tropicalis NOT DETECTED NOT DETECTED Final   Cryptococcus neoformans/gattii NOT DETECTED NOT DETECTED Final   Methicillin resistance mecA/C DETECTED (A) NOT DETECTED Final    Comment: CRITICAL RESULT CALLED TO, READ BACK BY AND VERIFIED WITH: PHARMD CAREN AMEND ON 02/15/23 @ 1652 BY DRT Performed at [facility], [address], [place], [place] [address]   Resp panel by RT-PCR (RSV, Flu A&B, Covid) Anterior Nasal Swab     Status: None   Collection Time: 02/14/23 10:41 PM   Specimen: Anterior Nasal Swab  Result Value Ref Range Status   SARS Coronavirus 2 by RT PCR NEGATIVE NEGATIVE Final   Influenza A by PCR NEGATIVE NEGATIVE Final   Influenza B by PCR NEGATIVE NEGATIVE Final    Comment: (NOTE) The Xpert Xpress SARS-CoV-2/FLU/RSV plus assay is intended as an aid in the diagnosis of influenza from Nasopharyngeal swab specimens  and should not be used as a sole basis for treatment. Nasal washings and aspirates are unacceptable for Xpert Xpress SARS-CoV-2/FLU/RSV testing.  Fact Sheet for Patients: BloggerCourse.com  Fact Sheet for Healthcare Providers: SeriousBroker.it  This test is not yet approved or cleared by the [place] FDA and has been authorized for detection and/or diagnosis of SARS-CoV-2 by FDA under an Emergency Use Authorization (EUA). This EUA will remain in effect (meaning this test can be used) for the duration of the COVID-19 declaration under Section 564(b)(1) of the Act, 21 U.S.C. section 360bbb-3(b)(1), unless the authorization is terminated or revoked.     Resp Syncytial Virus by PCR NEGATIVE NEGATIVE Final    Comment: (NOTE) Fact Sheet for Patients: BloggerCourse.com  Fact Sheet for Healthcare Providers: SeriousBroker.it  This test is not yet approved or cleared by the [place] FDA and has been authorized for detection and/or diagnosis of SARS-CoV-2 by FDA under an Emergency Use Authorization (EUA). This EUA will remain in effect (meaning this test can be used) for the duration of the COVID-19 declaration under Section 564(b)(1) of the Act, 21 U.S.C. section 360bbb-3(b)(1), unless the authorization is terminated or revoked.  Performed at [facility], [address] [place], [place] [address]   Urine Culture     Status: Abnormal   Collection Time: 02/14/23 10:41 PM   Specimen: Urine, Catheterized  Result Value Ref Range Status   Specimen Description URINE, CATHETERIZED  Final   Special Requests   Final    NONE Reflexed from [id number] Performed at [facility], [address], [place], [place] [address]    Culture (A)  Final    >=100,000 COLONIES/mL ESCHERICHIA COLI Confirmed Extended Spectrum Beta-Lactamase Producer (ESBL).  In bloodstream infections from  ESBL organisms, carbapenems are preferred over piperacillin/tazobactam. They are shown to have a lower risk of mortality.  Report Status 02/18/2023 FINAL  Final   Organism ID, Bacteria ESCHERICHIA COLI (A)  Final      Susceptibility   Escherichia coli - MIC*    AMPICILLIN >=32 RESISTANT Resistant     CEFAZOLIN >=64 RESISTANT Resistant     CEFEPIME >=32 RESISTANT Resistant     CEFTRIAXONE >=64 RESISTANT Resistant     CIPROFLOXACIN >=4 RESISTANT Resistant     GENTAMICIN <=1 SENSITIVE Sensitive     IMIPENEM <=0.25 SENSITIVE Sensitive     NITROFURANTOIN <=16 SENSITIVE Sensitive     TRIMETH/SULFA >=320 RESISTANT Resistant     AMPICILLIN/SULBACTAM >=32 RESISTANT Resistant     PIP/TAZO 16 SENSITIVE Sensitive ug/mL    * >=100,000 COLONIES/mL ESCHERICHIA COLI  Blood Culture (routine x 2)     Status: None (Preliminary result)   Collection Time: 02/14/23 10:43 PM   Specimen: BLOOD  Result Value Ref Range Status   Specimen Description BLOOD SITE NOT SPECIFIED  Final   Special Requests   Final    BOTTLES DRAWN AEROBIC AND ANAEROBIC Blood Culture adequate volume   Culture   Final    NO GROWTH 4 DAYS Performed at [facility], [address], [place], [place] [address]    Report Status PENDING  Incomplete  MRSA Next Gen by PCR, Nasal     Status: None   Collection Time: 02/15/23  4:26 PM   Specimen: Nasal Mucosa; Nasal Swab  Result Value Ref Range Status   MRSA by PCR Next Gen NOT DETECTED NOT DETECTED Final    Comment: (NOTE) The GeneXpert MRSA Assay (FDA approved for NASAL specimens only), is one component of a comprehensive MRSA colonization surveillance program. It is not intended to diagnose MRSA infection nor to guide or monitor treatment for MRSA infections. Test performance is not FDA approved in patients less than 13 years old. Performed at [facility], [address] [place], [place] [address]     Radiology Reports DG Abd Portable 1V Result Date:  02/18/2023 CLINICAL DATA:  78 year old female with history of constipation. EXAM: PORTABLE ABDOMEN - 1 VIEW COMPARISON:  No priors. FINDINGS: Gas and stool are seen scattered throughout the colon extending to the level of the distal rectum. No pathologic distension of small bowel is noted. No gross evidence of pneumoperitoneum. Markers from surgical mash project over the right side of the abdomen, presumably from remote hernia repair. Multiple old healed fractures of the superior and inferior pubic rami bilaterally. Vascular calcifications are noted. IMPRESSION: 1. Nonobstructive bowel gas pattern. 2. Aortic atherosclerosis. Electronically Signed   By: Trudie Reed M.D.   On: 02/18/2023 07:42   DG Chest Port 1 View Result Date: 02/18/2023 CLINICAL DATA:  78 year old female with history of shortness of breath. EXAM: PORTABLE CHEST 1 VIEW COMPARISON:  Chest x-ray 02/16/2023. FINDINGS: Lung volumes are very low. No definite consolidative airspace disease. No definite pleural effusions. No pneumothorax. No evidence of pulmonary edema. Heart size appears borderline enlarged. The patient is rotated to the left on today's exam, resulting in distortion of the mediastinal contours and reduced diagnostic sensitivity and specificity for mediastinal pathology. Atherosclerotic calcifications are noted in the thoracic aorta. Status post TAVR. IMPRESSION: 1. Low lung volumes without radiographic evidence of acute cardiopulmonary disease. 2. Aortic atherosclerosis. Electronically Signed   By: Trudie Reed M.D.   On: 02/18/2023 07:40   DG Chest Port 1 View Result Date: 02/16/2023 CLINICAL DATA:  Short of breath. EXAM: PORTABLE CHEST 1 VIEW COMPARISON:  02/14/2023  and older exams.  CT, 04/03/2019. FINDINGS: Cardiac silhouette mostly obscured by the left hemidiaphragm. Stable changes from previous aortic valve replacement. No mediastinal or hilar masses. Lung volumes are low. There is mild opacity at the left lung base  consistent with atelectasis. No convincing pneumonia or overt pulmonary edema. No pneumothorax. IMPRESSION: 1. Mild facet the left lung base, improved from the most recent prior exam, consistent with atelectasis. No convincing acute cardiopulmonary disease. Electronically Signed   By: Amie Portland M.D.   On: 02/16/2023 07:53      Signature  -   Susa Raring M.D on 02/19/2023 at 10:14 AM   -  To page go to [email or web address]

## 2023-02-19 NOTE — Plan of Care (Signed)
  Problem: Education: Goal: Ability to describe self-care measures that may prevent or decrease complications (Diabetes Survival Skills Education) will improve Outcome: Not Progressing Goal: Individualized Educational Video(s) Outcome: Not Progressing   Problem: Coping: Goal: Ability to adjust to condition or change in health will improve Outcome: Not Progressing   Problem: Fluid Volume: Goal: Ability to maintain a balanced intake and output will improve Outcome: Not Progressing   Problem: Health Behavior/Discharge Planning: Goal: Ability to identify and utilize available resources and services will improve Outcome: Not Progressing Goal: Ability to manage health-related needs will improve Outcome: Not Progressing   Problem: Metabolic: Goal: Ability to maintain appropriate glucose levels will improve Outcome: Not Progressing   Problem: Nutritional: Goal: Maintenance of adequate nutrition will improve Outcome: Not Progressing Goal: Progress toward achieving an optimal weight will improve Outcome: Not Progressing   Problem: Skin Integrity: Goal: Risk for impaired skin integrity will decrease Outcome: Not Progressing   Problem: Tissue Perfusion: Goal: Adequacy of tissue perfusion will improve Outcome: Not Progressing   Problem: Fluid Volume: Goal: Hemodynamic stability will improve Outcome: Not Progressing   Problem: Clinical Measurements: Goal: Diagnostic test results will improve Outcome: Not Progressing Goal: Signs and symptoms of infection will decrease Outcome: Not Progressing   Problem: Respiratory: Goal: Ability to maintain adequate ventilation will improve Outcome: Not Progressing   Problem: Education: Goal: Knowledge of General Education information will improve Description: Including pain rating scale, medication(s)/side effects and non-pharmacologic comfort measures Outcome: Not Progressing   Problem: Health Behavior/Discharge Planning: Goal: Ability  to manage health-related needs will improve Outcome: Not Progressing   Problem: Clinical Measurements: Goal: Ability to maintain clinical measurements within normal limits will improve Outcome: Not Progressing Goal: Will remain free from infection Outcome: Not Progressing Goal: Diagnostic test results will improve Outcome: Not Progressing Goal: Respiratory complications will improve Outcome: Not Progressing Goal: Cardiovascular complication will be avoided Outcome: Not Progressing   Problem: Activity: Goal: Risk for activity intolerance will decrease Outcome: Not Progressing   Problem: Nutrition: Goal: Adequate nutrition will be maintained Outcome: Not Progressing   Problem: Coping: Goal: Level of anxiety will decrease Outcome: Not Progressing   Problem: Elimination: Goal: Will not experience complications related to bowel motility Outcome: Not Progressing Goal: Will not experience complications related to urinary retention Outcome: Not Progressing   Problem: Pain Managment: Goal: General experience of comfort will improve and/or be controlled Outcome: Not Progressing   Problem: Safety: Goal: Ability to remain free from injury will improve Outcome: Not Progressing   Problem: Skin Integrity: Goal: Risk for impaired skin integrity will decrease Outcome: Not Progressing

## 2023-02-19 NOTE — Progress Notes (Signed)
Pharmacy Antibiotic Note  Katherine [name] is a 78 y.o. female admitted on 02/14/2023 with sepsis secondary to pneumonia.  Pharmacy has been consulted for Meropenem dosing for ESBL E.coli UTI, sepsis.     Plan: Meropenem 2g IV q12hr Monitor clinical status, renal function and culture results daily.    Height: 4\' 11"  (149.9 cm) Weight: 57.8 kg (127 lb 6.8 oz) IBW/kg (Calculated) : 43.2  Temp (24hrs), Avg:99.3 F (37.4 C), Min:97.7 F (36.5 C), Max:100 F (37.8 C)  Recent Labs  Lab 02/14/23 2225 02/15/23 0438 02/16/23 0606 02/17/23 0632 02/18/23 0806 02/19/23 0445  WBC  --  20.8* 15.4* 13.7* 14.3* 12.5*  CREATININE  --  0.93 0.60 0.61 0.53 0.62  LATICACIDVEN 1.6  --   --   --   --   --     Estimated Creatinine Clearance: 45.6 mL/min (by C-G formula based on SCr of 0.62 mg/dL).    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 PATIENT HAS HAD A PCN REACTION WITH IMMEDIATE RASH, FACIAL/TONGUE/THROAT SWELLING, SOB, OR LIGHTHEADEDNESS WITH HYPOTENSION:  #  #  #  YES  #  #  #   Has patient had a PCN reaction causing severe rash involving mucus membranes or skin necrosis: Unknown Has patient had a PCN reaction that required hospitalization: Unknown Has patient had a PCN reaction occurring within the last 10 years: Unknown    Euphorbia Other (See Comments)    Not listed on MAR   Coconut (Cocos Nucifera) Itching   Latex Rash   Mellaril [Thioridazine] Itching    Antimicrobials this admission: Cefepime 1/15 >>1/17 Vanc 1/15>>1/17 Azithro IV 1/16>>1/17 Flagyl x 1 on 1/15; resume 1/17>>(1/21) CTX 1/17 >1/20 Meropenem 1/20>>thru 1/24   1/15 Blood: negative in 1 set, GPC in clusters in 2nd set > staph hominis.  1/15 BCID: MRSE in 1/4 bottles - contaminant? 1/15 urine: >100K/ml E.coli  ESBL  1/15 COVID, flu and RSV: neg 1/16 MRSA PCR: neg    Dose adjustments this admission:   Microbiology results: Thank you  for allowing pharmacy to be a part of this patient's care.  [facility] 02/19/2023 11:04 AM

## 2023-02-19 NOTE — Plan of Care (Signed)
  Problem: Nutritional: Goal: Maintenance of adequate nutrition will improve Outcome: Progressing   Problem: Metabolic: Goal: Ability to maintain appropriate glucose levels will improve Outcome: Progressing   Problem: Skin Integrity: Goal: Risk for impaired skin integrity will decrease Outcome: Progressing   Problem: Fluid Volume: Goal: Hemodynamic stability will improve Outcome: Progressing

## 2023-02-20 DIAGNOSIS — A419 Sepsis, unspecified organism: Secondary | ICD-10-CM | POA: Diagnosis not present

## 2023-02-20 LAB — GLUCOSE, CAPILLARY
Glucose-Capillary: 104 mg/dL — ABNORMAL HIGH (ref 70–99)
Glucose-Capillary: 141 mg/dL — ABNORMAL HIGH (ref 70–99)
Glucose-Capillary: 111 mg/dL — ABNORMAL HIGH (ref 70–99)
Glucose-Capillary: 171 mg/dL — ABNORMAL HIGH (ref 70–99)

## 2023-02-20 LAB — BASIC METABOLIC PANEL
Anion gap: 11 (ref 5–15)
BUN: 9 mg/dL (ref 8–23)
CO2: 22 mmol/L (ref 22–32)
Calcium: 7.6 mg/dL — ABNORMAL LOW (ref 8.9–10.3)
Chloride: 107 mmol/L (ref 98–111)
Creatinine, Ser: 0.53 mg/dL (ref 0.44–1.00)
GFR, Estimated: >60 mL/min (ref >60)
Glucose, Bld: 116 mg/dL — ABNORMAL HIGH (ref 70–99)
Potassium: 3.3 mmol/L — ABNORMAL LOW (ref 3.5–5.1)
Sodium: 140 mmol/L (ref 135–145)

## 2023-02-20 LAB — MAGNESIUM: Magnesium: 2.1 mg/dL (ref 1.7–2.4)

## 2023-02-20 MED ORDER — POTASSIUM CHLORIDE CRYS ER 20 MEQ PO TBCR
40.0000 meq | EXTENDED_RELEASE_TABLET | Freq: Once | ORAL | Status: AC
  Administered 2023-02-20: 40 meq via ORAL
  Filled 2023-02-20: qty 2

## 2023-02-20 MED ORDER — SODIUM CHLORIDE 0.9 % IV SOLN
1.0000 g | Freq: Two times a day (BID) | INTRAVENOUS | Status: DC
  Administered 2023-02-20 – 2023-02-23 (×7): 1 g via INTRAVENOUS
  Filled 2023-02-20 (×7): qty 20

## 2023-02-20 NOTE — Progress Notes (Signed)
PROGRESS NOTE                                                                                                                                                                                                             Patient Demographics:    Katherine [name], is a 79 y.o. female, DOB - 01-19-1946, JXB:[id number]  Outpatient Primary MD for the patient is Ronnald Collum    LOS - 6  Admit date - 02/14/2023    Chief Complaint  Patient presents with   Code Sepsis       Brief Narrative (HPI from H&P)   78 year old female with history of coronary disease status post PCI, diabetes type 2, TAVR, dementia with behavioral disturbances, GERD who presented from skilled nursing facility with complaint of shortness of breath, fever, cough, confusion increased from baseline.  On presentation, she was hypoxic requiring 2 L of oxygen, febrile, hypotensive.  She was diagnosed with sepsis due to UTI and pneumonia and admitted to the hospital.   Subjective:   Patient in bed, appears comfortable, denies any headache, no fever, no chest pain or pressure, no shortness of breath , no abdominal pain. No new focal weakness.   Assessment  & Plan :   Sepsis secondary to UTI present on admission along with pneumonia: Being treated with empiric IV antibiotics, IV fluids, sepsis pathophysiology has improved, will be seen by speech therapy, follow cultures, blood cultures 1 out of 2 appears to be contaminant.  Urine E. coli appears to be pan resistant, switched to carbapenem on 02/19/2023, currently clinically so far she has showed serial improvement.  Will give antibiotics for at least 5 days.   Acute hypoxic respiratory  failure: Secondary to pneumonia/recent COVID.  On 2 L.  Continue to wean.  Clinically much improved.   Hypokalemia, hypomagnesemia, hypophosphatemia: Replaced.   History of coronary artery disease: Status post PCI.  Metoprolol on  hold due to hypotension.  On Crestor, Eliquis   Acute metabolic encephalopathy: Secondary to sepsis/pneumonia.  CT head did not show any acute findings.   History of dementia: On Risperdal, Cogentin, Namenda.  Frequent reorientation, delirium precautions   Hypertension: Home metoprolol, hydrochlorothiazide on hold due to hypotension   GERD: Continue PPI   Paroxysmal A-fib: Takes metoprolol, Eliquis.  Monitor on telemetry   Hypohyroidism.  TSH suppressed, free  T4 elevated, hold Synthroid here upon discharge cut down the home dose  Diabetes type 2: Takes metformin.  Currently on sliding scale.  Monitor blood sugars  CBG (last 3)  Recent Labs    02/19/23 1618 02/19/23 2113 02/20/23 0803  GLUCAP 122* 121* 104*         Condition - Fair  Family Communication  : Updated niece Lupita Leash [phone]   in detail on 02/16/2023  Code Status :  DNR  Consults  :  None  PUD Prophylaxis :  PPI   Procedures  :            Disposition Plan  :    Status is: Inpatient  DVT Prophylaxis  :     apixaban (ELIQUIS) tablet 5 mg     Lab Results  Component Value Date   PLT 189 02/19/2023    Diet :  Diet Order             DIET DYS 3 Room service appropriate? Yes; Fluid consistency: Thin  Diet effective now                    Inpatient Medications  Scheduled Meds:  apixaban  5 mg Oral BID   benztropine  0.5 mg Oral BID   memantine  28 mg Oral Daily   And   donepezil  10 mg Oral Daily   insulin aspart  0-9 Units Subcutaneous TID WC   magnesium oxide  400 mg Oral Daily   metoprolol tartrate  50 mg Oral BID   pantoprazole  40 mg Oral Daily   risperiDONE  2 mg Oral QHS   rosuvastatin  40 mg Oral QHS   zinc oxide  1 Application Topical BID   Continuous Infusions:  meropenem (MERREM) IV 1 g (02/20/23 0920)   PRN Meds:.acetaminophen **OR** acetaminophen, diltiazem, metoprolol tartrate   Objective:   Vitals:   02/19/23 2200 02/20/23 0000 02/20/23 0400 02/20/23  0800  BP:  123/88  (!) 144/99  Pulse: 73 (!) 37  81  Resp: (!) 21 (!) 22  20  Temp:  97.9 F (36.6 C) 98.6 F (37 C) 98 F (36.7 C)  TempSrc:  Oral Oral Oral  SpO2: 96% 98%  98%  Weight:      Height:        Wt Readings from Last 3 Encounters:  02/14/23 57.8 kg  02/04/23 55 kg  06/15/21 56 kg     Intake/Output Summary (Last 24 hours) at 02/20/2023 1031 Last data filed at 02/20/2023 0400 Gross per 24 hour  Intake 780 ml  Output 400 ml  Net 380 ml     Physical Exam  Awake but confused, No new F.N deficits, Normal affect Kimberly.AT,PERRAL Supple Neck, No JVD,   Symmetrical Chest wall movement, Good air movement bilaterally, CTAB RRR,No Gallops,Rubs or new Murmurs,  +ve B.Sounds, Abd Soft, No tenderness,   No Cyanosis, Clubbing or edema     RN pressure injury documentation: Pressure Injury 02/15/23 Buttocks Medial;Right Deep Tissue Pressure Injury - Purple or maroon localized area of discolored intact skin or blood-filled blister due to damage of underlying soft tissue from pressure and/or shear. evolving dtpi 3 cm x 3 cm w (Active)  02/15/23   Location: Buttocks  Location Orientation: Medial;Right  Staging: Deep Tissue Pressure Injury - Purple or maroon localized area of discolored intact skin or blood-filled blister due to damage of underlying soft tissue from pressure and/or shear.  Wound Description (Comments): evolving  dtpi 3 cm x 3 cm with partial thickness skin loss  Present on Admission: Yes  Dressing Type Foam - Lift dressing to assess site every shift 02/20/23 0800      Data Review:    Recent Labs  Lab 02/14/23 2130 02/15/23 0438 02/16/23 0606 02/17/23 0632 02/18/23 0806 02/19/23 0445  WBC 22.0* 20.8* 15.4* 13.7* 14.3* 12.5*  HGB 12.5 11.2* 9.9* 10.2* 10.0* 9.5*  HCT 39.5 35.4* 31.6* 31.5* 30.5* 29.6*  PLT 341 273 259 252 201 189  MCV 95.9 96.5 96.3 94.6 92.7 94.3  MCH 30.3 30.5 30.2 30.6 30.4 30.3  MCHC 31.6 31.6 31.3 32.4 32.8 32.1  RDW 13.7 13.8  13.9 13.9 13.9 14.0  LYMPHSABS 1.4  --   --  1.1 2.3 1.8  MONOABS 1.2*  --   --  0.8 0.6 0.5  EOSABS 0.0  --   --  0.1 0.1 0.2  BASOSABS 0.0  --   --  0.0 0.0 0.1    Recent Labs  Lab 02/14/23 2130 02/14/23 2225 02/15/23 0438 02/15/23 0452 02/15/23 0657 02/15/23 2011 02/16/23 0606 02/16/23 0827 02/16/23 1036 02/17/23 0632 02/18/23 0806 02/19/23 0445 02/20/23 0449  NA 144  --  146*  --   --   --  148*  --   --  144 141 140 140  K 3.4*  --  2.9*  --    < >  --  3.4*  --   --  3.1* 2.5* 3.9 3.3*  CL 104  --  109  --   --   --  114*  --   --  112* 105 109 107  CO2 23  --  23  --   --   --  22  --   --  23 25 22 22   ANIONGAP 17*  --  14  --   --   --  12  --   --  9 11 9 11   GLUCOSE 197*  --  147*  --   --   --  126*  --   --  144* 115* 109* 116*  BUN 27*  --  27*  --   --   --  21  --   --  11 5* 7* 9  CREATININE 0.95  --  0.93  --   --   --  0.60  --   --  0.61 0.53 0.62 0.53  AST 21  --   --   --   --   --  20  --   --  20 22 29   --   ALT 18  --   --   --   --   --  17  --   --  17 20 23   --   ALKPHOS 61  --   --   --   --   --  52  --   --  52 47 43  --   BILITOT 1.1  --   --   --   --   --  0.8  --   --  0.6 0.5 0.4  --   ALBUMIN 3.1*  --   --   --   --   --  2.3*  --   --  2.3* 2.1* 2.1*  --   CRP  --   --   --   --   --   --  18.0*  --   --  10.8* 6.1* 3.9*  --   PROCALCITON  --   --   --   --   --   --  0.34  --   --  0.15 <0.10 <0.10  --   LATICACIDVEN  --  1.6  --   --   --   --   --   --   --   --   --   --   --   INR  --   --  1.6*  --   --  1.6*  --   --   --   --   --   --   --   TSH  --   --   --  0.321*  --   --   --   --  0.311*  --  0.213*  --   --   HGBA1C  --   --  5.9*  --   --   --   --   --   --   --   --   --   --   AMMONIA  --   --   --   --   --   --  15  --   --  24 19 28   --   BNP  --   --   --   --   --   --   --  548.5*  --  1,178.7* 776.4* 362.3*  --   MG  --   --   --   --    < >  --  2.0  --   --  1.5* 1.8 1.5* 2.1  PHOS  --   --   --   --   --    --  1.7*  --   --  2.4* 2.2* 2.9  --   CALCIUM 9.3  --  8.4*  --   --   --  8.1*  --   --  7.5* 6.9* 7.2* 7.6*   < > = values in this interval not displayed.      Recent Labs  Lab 02/14/23 2225 02/15/23 0438 02/15/23 0452 02/15/23 0657 02/15/23 2011 02/16/23 0606 02/16/23 0827 02/16/23 1036 02/17/23 8469 02/18/23 0806 02/19/23 0445 02/20/23 0449  CRP  --   --   --   --   --  18.0*  --   --  10.8* 6.1* 3.9*  --   PROCALCITON  --   --   --   --   --  0.34  --   --  0.15 <0.10 <0.10  --   LATICACIDVEN 1.6  --   --   --   --   --   --   --   --   --   --   --   INR  --  1.6*  --   --  1.6*  --   --   --   --   --   --   --   TSH  --   --  0.321*  --   --   --   --  0.311*  --  0.213*  --   --   HGBA1C  --  5.9*  --   --   --   --   --   --   --   --   --   --   AMMONIA  --   --   --   --   --  15  --   --  24 19 28   --   BNP  --   --   --   --   --   --  548.5*  --  1,178.7* 776.4* 362.3*  --   MG  --   --   --    < >  --  2.0  --   --  1.5* 1.8 1.5* 2.1  CALCIUM  --  8.4*  --   --   --  8.1*  --   --  7.5* 6.9* 7.2* 7.6*   < > = values in this interval not displayed.    --------------------------------------------------------------------------------------------------------------- Lab Results  Component Value Date   CHOL 122 10/27/2019   HDL 50 10/27/2019   LDLCALC 52 10/27/2019   LDLDIRECT 66.0 06/16/2014   TRIG 113 10/27/2019   CHOLHDL 2.4 10/27/2019    Lab Results  Component Value Date   HGBA1C 5.9 (H) 02/15/2023   Recent Labs    02/18/23 0806  TSH 0.213*  FREET4 1.78*     Micro Results Recent Results (from the past 240 hours)  Blood Culture (routine x 2)     Status: Abnormal   Collection Time: 02/14/23  9:33 PM   Specimen: BLOOD  Result Value Ref Range Status   Specimen Description BLOOD LEFT ANTECUBITAL  Final   Special Requests   Final    BOTTLES DRAWN AEROBIC AND ANAEROBIC Blood Culture results may not be optimal due to an inadequate volume of blood  received in culture bottles   Culture  Setup Time   Final    GRAM POSITIVE COCCI IN CLUSTERS AEROBIC BOTTLE ONLY CRITICAL RESULT CALLED TO, READ BACK BY AND VERIFIED WITH: PHARMD CAREN AMEND ON 02/15/23 @ 1652 BY DRT    Culture (A)  Final    STAPHYLOCOCCUS HOMINIS UNABLE TO ISOLATE STAPHYLOCOCCUS EPIDERMIDIS WHICH WAS IDENTIFIED AS PRESENT BY BCID. THE SIGNIFICANCE OF ISOLATING THIS ORGANISM FROM A SINGLE SET OF BLOOD CULTURES WHEN MULTIPLE SETS ARE DRAWN IS UNCERTAIN. PLEASE NOTIFY THE MICROBIOLOGY DEPARTMENT WITHIN ONE WEEK IF SPECIATION AND SENSITIVITIES ARE REQUIRED. Performed at [facility], [address], [place], [place] [address]    Report Status 02/17/2023 FINAL  Final  Blood Culture ID Panel (Reflexed)     Status: Abnormal   Collection Time: 02/14/23  9:33 PM  Result Value Ref Range Status   Enterococcus faecalis NOT DETECTED NOT DETECTED Final   Enterococcus Faecium NOT DETECTED NOT DETECTED Final   Listeria monocytogenes NOT DETECTED NOT DETECTED Final   Staphylococcus species DETECTED (A) NOT DETECTED Final    Comment: CRITICAL RESULT CALLED TO, READ BACK BY AND VERIFIED WITH: PHARMD CAREN AMEND ON 02/15/23 @ 1652 BY DRT    Staphylococcus aureus (BCID) NOT DETECTED NOT DETECTED Final   Staphylococcus epidermidis DETECTED (A) NOT DETECTED Final    Comment: Methicillin (oxacillin) resistant coagulase negative staphylococcus. Possible blood culture contaminant (unless isolated from more than one blood culture draw or clinical case suggests pathogenicity). No antibiotic treatment is indicated for blood  culture contaminants. CRITICAL RESULT CALLED TO, READ BACK BY AND VERIFIED WITH: PHARMD CAREN AMEND ON 02/15/23 @ 1652 BY DRT    Staphylococcus lugdunensis NOT DETECTED NOT DETECTED Final   Streptococcus species NOT DETECTED NOT DETECTED Final   Streptococcus agalactiae NOT DETECTED NOT DETECTED Final   Streptococcus pneumoniae NOT DETECTED NOT DETECTED Final    Streptococcus pyogenes NOT DETECTED NOT DETECTED Final   A.calcoaceticus-baumannii NOT DETECTED NOT DETECTED  Final   Bacteroides fragilis NOT DETECTED NOT DETECTED Final   Enterobacterales NOT DETECTED NOT DETECTED Final   Enterobacter cloacae complex NOT DETECTED NOT DETECTED Final   Escherichia coli NOT DETECTED NOT DETECTED Final   Klebsiella aerogenes NOT DETECTED NOT DETECTED Final   Klebsiella oxytoca NOT DETECTED NOT DETECTED Final   Klebsiella pneumoniae NOT DETECTED NOT DETECTED Final   Proteus species NOT DETECTED NOT DETECTED Final   Salmonella species NOT DETECTED NOT DETECTED Final   Serratia marcescens NOT DETECTED NOT DETECTED Final   Haemophilus influenzae NOT DETECTED NOT DETECTED Final   Neisseria meningitidis NOT DETECTED NOT DETECTED Final   Pseudomonas aeruginosa NOT DETECTED NOT DETECTED Final   Stenotrophomonas maltophilia NOT DETECTED NOT DETECTED Final   Candida albicans NOT DETECTED NOT DETECTED Final   Candida auris NOT DETECTED NOT DETECTED Final   Candida glabrata NOT DETECTED NOT DETECTED Final   Candida krusei NOT DETECTED NOT DETECTED Final   Candida parapsilosis NOT DETECTED NOT DETECTED Final   Candida tropicalis NOT DETECTED NOT DETECTED Final   Cryptococcus neoformans/gattii NOT DETECTED NOT DETECTED Final   Methicillin resistance mecA/C DETECTED (A) NOT DETECTED Final    Comment: CRITICAL RESULT CALLED TO, READ BACK BY AND VERIFIED WITH: PHARMD CAREN AMEND ON 02/15/23 @ 1652 BY DRT Performed at [facility], [address], [place], [place] [address]   Resp panel by RT-PCR (RSV, Flu A&B, Covid) Anterior Nasal Swab     Status: None   Collection Time: 02/14/23 10:41 PM   Specimen: Anterior Nasal Swab  Result Value Ref Range Status   SARS Coronavirus 2 by RT PCR NEGATIVE NEGATIVE Final   Influenza A by PCR NEGATIVE NEGATIVE Final   Influenza B by PCR NEGATIVE NEGATIVE Final    Comment: (NOTE) The Xpert Xpress SARS-CoV-2/FLU/RSV plus assay is  intended as an aid in the diagnosis of influenza from Nasopharyngeal swab specimens and should not be used as a sole basis for treatment. Nasal washings and aspirates are unacceptable for Xpert Xpress SARS-CoV-2/FLU/RSV testing.  Fact Sheet for Patients: BloggerCourse.com  Fact Sheet for Healthcare Providers: SeriousBroker.it  This test is not yet approved or cleared by the [place] FDA and has been authorized for detection and/or diagnosis of SARS-CoV-2 by FDA under an Emergency Use Authorization (EUA). This EUA will remain in effect (meaning this test can be used) for the duration of the COVID-19 declaration under Section 564(b)(1) of the Act, 21 U.S.C. section 360bbb-3(b)(1), unless the authorization is terminated or revoked.     Resp Syncytial Virus by PCR NEGATIVE NEGATIVE Final    Comment: (NOTE) Fact Sheet for Patients: BloggerCourse.com  Fact Sheet for Healthcare Providers: SeriousBroker.it  This test is not yet approved or cleared by the [place] FDA and has been authorized for detection and/or diagnosis of SARS-CoV-2 by FDA under an Emergency Use Authorization (EUA). This EUA will remain in effect (meaning this test can be used) for the duration of the COVID-19 declaration under Section 564(b)(1) of the Act, 21 U.S.C. section 360bbb-3(b)(1), unless the authorization is terminated or revoked.  Performed at [facility], [address], [place], [place] [address]   Urine Culture     Status: Abnormal   Collection Time: 02/14/23 10:41 PM   Specimen: Urine, Catheterized  Result Value Ref Range Status   Specimen Description URINE, CATHETERIZED  Final   Special Requests   Final    NONE Reflexed from [id number] Performed at [facility], [address] [place], [place] [address]    Culture (A)  Final    >=100,000 COLONIES/mL ESCHERICHIA  COLI Confirmed Extended Spectrum Beta-Lactamase Producer (ESBL).  In bloodstream infections from ESBL organisms, carbapenems are preferred over piperacillin/tazobactam. They are shown to have a lower risk of mortality.    Report Status 02/18/2023 FINAL  Final   Organism ID, Bacteria ESCHERICHIA COLI (A)  Final      Susceptibility   Escherichia coli - MIC*    AMPICILLIN >=32 RESISTANT Resistant     CEFAZOLIN >=64 RESISTANT Resistant     CEFEPIME >=32 RESISTANT Resistant     CEFTRIAXONE >=64 RESISTANT Resistant     CIPROFLOXACIN >=4 RESISTANT Resistant     GENTAMICIN <=1 SENSITIVE Sensitive     IMIPENEM <=0.25 SENSITIVE Sensitive     NITROFURANTOIN <=16 SENSITIVE Sensitive     TRIMETH/SULFA >=320 RESISTANT Resistant     AMPICILLIN/SULBACTAM >=32 RESISTANT Resistant     PIP/TAZO 16 SENSITIVE Sensitive ug/mL    * >=100,000 COLONIES/mL ESCHERICHIA COLI  Blood Culture (routine x 2)     Status: None   Collection Time: 02/14/23 10:43 PM   Specimen: BLOOD  Result Value Ref Range Status   Specimen Description BLOOD SITE NOT SPECIFIED  Final   Special Requests   Final    BOTTLES DRAWN AEROBIC AND ANAEROBIC Blood Culture adequate volume   Culture   Final    NO GROWTH 5 DAYS Performed at [facility], [address], [place], [place] [address]    Report Status 02/19/2023 FINAL  Final  MRSA Next Gen by PCR, Nasal     Status: None   Collection Time: 02/15/23  4:26 PM   Specimen: Nasal Mucosa; Nasal Swab  Result Value Ref Range Status   MRSA by PCR Next Gen NOT DETECTED NOT DETECTED Final    Comment: (NOTE) The GeneXpert MRSA Assay (FDA approved for NASAL specimens only), is one component of a comprehensive MRSA colonization surveillance program. It is not intended to diagnose MRSA infection nor to guide or monitor treatment for MRSA infections. Test performance is not FDA approved in patients less than 3 years old. Performed at [facility], [address] [place],  [place] [address]     Radiology Reports DG Abd Portable 1V Result Date: 02/18/2023 CLINICAL DATA:  78 year old female with history of constipation. EXAM: PORTABLE ABDOMEN - 1 VIEW COMPARISON:  No priors. FINDINGS: Gas and stool are seen scattered throughout the colon extending to the level of the distal rectum. No pathologic distension of small bowel is noted. No gross evidence of pneumoperitoneum. Markers from surgical mash project over the right side of the abdomen, presumably from remote hernia repair. Multiple old healed fractures of the superior and inferior pubic rami bilaterally. Vascular calcifications are noted. IMPRESSION: 1. Nonobstructive bowel gas pattern. 2. Aortic atherosclerosis. Electronically Signed   By: Trudie Reed M.D.   On: 02/18/2023 07:42   DG Chest Port 1 View Result Date: 02/18/2023 CLINICAL DATA:  78 year old female with history of shortness of breath. EXAM: PORTABLE CHEST 1 VIEW COMPARISON:  Chest x-ray 02/16/2023. FINDINGS: Lung volumes are very low. No definite consolidative airspace disease. No definite pleural effusions. No pneumothorax. No evidence of pulmonary edema. Heart size appears borderline enlarged. The patient is rotated to the left on today's exam, resulting in distortion of the mediastinal contours and reduced diagnostic sensitivity and specificity for mediastinal pathology. Atherosclerotic calcifications are noted in the thoracic aorta. Status post TAVR. IMPRESSION: 1.  Low lung volumes without radiographic evidence of acute cardiopulmonary disease. 2. Aortic atherosclerosis. Electronically Signed   By: Trudie Reed M.D.   On: 02/18/2023 07:40      Signature  -   Susa Raring M.D on 02/20/2023 at 10:31 AM   -  To page go to [email or web address]

## 2023-02-20 NOTE — Plan of Care (Signed)
  Problem: Pain Managment: Goal: General experience of comfort will improve and/or be controlled Outcome: Progressing   Problem: Elimination: Goal: Will not experience complications related to bowel motility Outcome: Progressing   Problem: Skin Integrity: Goal: Risk for impaired skin integrity will decrease Outcome: Progressing   Problem: Safety: Goal: Ability to remain free from injury will improve Outcome: Progressing

## 2023-02-21 ENCOUNTER — Telehealth: Payer: Self-pay | Admitting: Cardiovascular Disease

## 2023-02-21 DIAGNOSIS — A419 Sepsis, unspecified organism: Secondary | ICD-10-CM | POA: Diagnosis not present

## 2023-02-21 LAB — CBC WITH DIFFERENTIAL/PLATELET
Abs Immature Granulocytes: 0.09 10*3/uL — ABNORMAL HIGH (ref 0.00–0.07)
Basophils Absolute: 0 10*3/uL (ref 0.0–0.1)
Basophils Relative: 0 %
Eosinophils Absolute: 0.1 10*3/uL (ref 0.0–0.5)
Eosinophils Relative: 1 %
HCT: 30.9 % — ABNORMAL LOW (ref 36.0–46.0)
Hemoglobin: 9.9 g/dL — ABNORMAL LOW (ref 12.0–15.0)
Immature Granulocytes: 1 %
Lymphocytes Relative: 14 %
Lymphs Abs: 1.5 10*3/uL (ref 0.7–4.0)
MCH: 30.1 pg (ref 26.0–34.0)
MCHC: 32 g/dL (ref 30.0–36.0)
MCV: 93.9 fL (ref 80.0–100.0)
Monocytes Absolute: 0.6 10*3/uL (ref 0.1–1.0)
Monocytes Relative: 6 %
Neutro Abs: 8 10*3/uL — ABNORMAL HIGH (ref 1.7–7.7)
Neutrophils Relative %: 78 %
Platelets: 227 10*3/uL (ref 150–400)
RBC: 3.29 MIL/uL — ABNORMAL LOW (ref 3.87–5.11)
RDW: 13.8 % (ref 11.5–15.5)
WBC: 10.3 10*3/uL (ref 4.0–10.5)
nRBC: 0 % (ref 0.0–0.2)

## 2023-02-21 LAB — BASIC METABOLIC PANEL
Anion gap: 8 (ref 5–15)
BUN: 11 mg/dL (ref 8–23)
CO2: 27 mmol/L (ref 22–32)
Calcium: 8.3 mg/dL — ABNORMAL LOW (ref 8.9–10.3)
Chloride: 109 mmol/L (ref 98–111)
Creatinine, Ser: 0.5 mg/dL (ref 0.44–1.00)
GFR, Estimated: >60 mL/min (ref >60)
Glucose, Bld: 117 mg/dL — ABNORMAL HIGH (ref 70–99)
Potassium: 3.2 mmol/L — ABNORMAL LOW (ref 3.5–5.1)
Sodium: 144 mmol/L (ref 135–145)

## 2023-02-21 LAB — GLUCOSE, CAPILLARY
Glucose-Capillary: 105 mg/dL — ABNORMAL HIGH (ref 70–99)
Glucose-Capillary: 94 mg/dL (ref 70–99)
Glucose-Capillary: 97 mg/dL (ref 70–99)
Glucose-Capillary: 99 mg/dL (ref 70–99)
Glucose-Capillary: 146 mg/dL — ABNORMAL HIGH (ref 70–99)

## 2023-02-21 LAB — PHOSPHORUS: Phosphorus: 2.9 mg/dL (ref 2.5–4.6)

## 2023-02-21 LAB — MAGNESIUM: Magnesium: 1.7 mg/dL (ref 1.7–2.4)

## 2023-02-21 MED ORDER — POTASSIUM CHLORIDE CRYS ER 20 MEQ PO TBCR
40.0000 meq | EXTENDED_RELEASE_TABLET | Freq: Four times a day (QID) | ORAL | Status: AC
  Administered 2023-02-21 (×2): 40 meq via ORAL
  Filled 2023-02-21 (×2): qty 2

## 2023-02-21 NOTE — Telephone Encounter (Signed)
  Pt c/o medication issue:  1. Name of Medication:   apixaban (ELIQUIS) tablet 5 mg    2. How are you currently taking this medication (dosage and times per day)?   5 mg, Oral, 2 times daily    3. Are you having a reaction (difficulty breathing--STAT)? No   4. What is your medication issue? Pt's sister, Katherine [name], called and stated that Katherine [name] discontinued her Eliquis since she is no longer in [facility]. However, Katherine Cloud, NP, is requesting that this information be faxed to her at [phone]. Additionally, the pt is currently at [facility] and may be discharged on Friday. Katherine [name] is a little concerned that the pt may not be ready to go out again on Tuesday, 02/27/23, as she has been in the hospital since 02/14/23. The next available appointment with Dr. Excell Seltzer is in April, but they do not want to wait that long. The pt is eager to see Dr. Excell Seltzer since they last saw Katherine [name]. She would like to check in with Dr. Excell Seltzer if he can see her on 03/05/23

## 2023-02-21 NOTE — Plan of Care (Signed)
  Problem: Education: Goal: Ability to describe self-care measures that may prevent or decrease complications (Diabetes Survival Skills Education) will improve Outcome: Not Progressing Goal: Individualized Educational Video(s) Outcome: Not Progressing   Problem: Coping: Goal: Ability to adjust to condition or change in health will improve Outcome: Not Progressing

## 2023-02-21 NOTE — Progress Notes (Signed)
PROGRESS NOTE                                                                                                                                                                                                             Patient Demographics:    Katherine [name], is a 78 y.o. female, DOB - 05-Jun-1945, UJW:[id number]  Outpatient Primary MD for the patient is Ronnald Collum    LOS - 7  Admit date - 02/14/2023    Chief Complaint  Patient presents with   Code Sepsis       Brief Narrative (HPI from H&P)    78 year old female with history of coronary disease status post PCI, diabetes type 2, TAVR, dementia with behavioral disturbances, GERD who presented from skilled nursing facility with complaint of shortness of breath, fever, cough, confusion increased from baseline.  On presentation, she was hypoxic requiring 2 L of oxygen, febrile, hypotensive.  She was diagnosed with sepsis due to UTI and pneumonia and admitted to the hospital.   Subjective:   No significant events overnight as discussed with staff, patient with dementia, denies any complaints today, she is eating 50% of her meals as discussed with staff.    Assessment  & Plan :   Sepsis secondary to UTI and pneumonia. . -Sepsis, present on admission -Being treated with empiric IV antibiotics, IV fluids, sepsis pathophysiology has improved. - blood cultures 1 out of 2 appears to be contaminant. -  Urine E. coli appears to be pan resistant, switched to carbapenem on 02/19/2023, currently clinically so far she has showed serial improvement.  Will give antibiotics for at least 5 days, last dose 1/25.   Acute hypoxic respiratory  failure:  -Secondary to pneumonia/recent COVID.  On 2 L.  Continue to wean.  Clinically much improved. -PMH appreciated, on dysphagia 3 with thin liquid   Hypokalemia, hypomagnesemia, hypophosphatemia: Replaced.   History of coronary artery  disease: Status post PCI.  Metoprolol on hold due to hypotension.  On Crestor, Eliquis   Acute metabolic encephalopathy: Secondary to sepsis/pneumonia.  CT head did not show any acute findings.   History of dementia: On Risperdal, Cogentin, Namenda.  Frequent reorientation, delirium precautions   Hypertension: Home metoprolol, hydrochlorothiazide on hold due to hypotension   GERD: Continue PPI  Hypokalemia -Repleted   Paroxysmal A-fib: Takes metoprolol, Eliquis.  Monitor on telemetry   Hypohyroidism.  TSH suppressed, free T4 elevated, hold Synthroid here upon discharge cut down the home dose  Diabetes type 2: Takes metformin.  Currently on sliding scale.  Monitor blood sugars  CBG (last 3)  Recent Labs    02/20/23 2247 02/21/23 0814 02/21/23 1223  GLUCAP 171* 94 97         Condition - Fair  Family Communication  : None at bedside D/W sister by phone.  Code Status :  DNR  Consults  :  None  PUD Prophylaxis :  PPI   Procedures  :            Disposition Plan  :    Status is: Inpatient  DVT Prophylaxis  :     apixaban (ELIQUIS) tablet 5 mg     Lab Results  Component Value Date   PLT 227 02/21/2023    Diet :  Diet Order             DIET DYS 3 Room service appropriate? Yes; Fluid consistency: Thin  Diet effective now                    Inpatient Medications  Scheduled Meds:  apixaban  5 mg Oral BID   benztropine  0.5 mg Oral BID   memantine  28 mg Oral Daily   And   donepezil  10 mg Oral Daily   insulin aspart  0-9 Units Subcutaneous TID WC   magnesium oxide  400 mg Oral Daily   metoprolol tartrate  50 mg Oral BID   pantoprazole  40 mg Oral Daily   potassium chloride  40 mEq Oral Q6H   risperiDONE  2 mg Oral QHS   rosuvastatin  40 mg Oral QHS   zinc oxide  1 Application Topical BID   Continuous Infusions:  meropenem (MERREM) IV 1 g (02/21/23 0831)   PRN Meds:.acetaminophen **OR** acetaminophen, diltiazem, metoprolol tartrate    Objective:   Vitals:   02/20/23 2347 02/21/23 0323 02/21/23 0709 02/21/23 0800  BP: 122/79 112/75  125/73  Pulse:   61 (!) 59  Resp:   19 19  Temp: 98.2 F (36.8 C) 98.4 F (36.9 C)  98 F (36.7 C)  TempSrc: Oral Oral  Oral  SpO2:   96% 97%  Weight:      Height:        Wt Readings from Last 3 Encounters:  02/14/23 57.8 kg  02/04/23 55 kg  06/15/21 56 kg     Intake/Output Summary (Last 24 hours) at 02/21/2023 1331 Last data filed at 02/21/2023 0300 Gross per 24 hour  Intake 560.06 ml  Output 700 ml  Net -139.94 ml     Physical Exam  Awake Alert, frail, deconditioned, demented, pleasant in no apparent distress Symmetrical Chest wall movement, Good air movement bilaterally, CTAB RRR,No Gallops,Rubs or new Murmurs, No Parasternal Heave +ve B.Sounds, Abd Soft, No tenderness, No rebound - guarding or rigidity. No Cyanosis, Clubbing or edema, No new Rash or bruise       RN pressure injury documentation: Pressure Injury 02/15/23 Buttocks Medial;Right Deep Tissue Pressure Injury - Purple or maroon localized area of discolored intact skin or blood-filled blister due to damage of underlying soft tissue from pressure and/or shear. evolving dtpi 3 cm x 3 cm w (Active)  02/15/23   Location: Buttocks  Location Orientation: Medial;Right  Staging: Deep Tissue Pressure Injury - Purple or maroon localized area of discolored intact  skin or blood-filled blister due to damage of underlying soft tissue from pressure and/or shear.  Wound Description (Comments): evolving dtpi 3 cm x 3 cm with partial thickness skin loss  Present on Admission: Yes  Dressing Type Foam - Lift dressing to assess site every shift 02/21/23 0709      Data Review:    Recent Labs  Lab 02/14/23 2130 02/15/23 0438 02/16/23 0606 02/17/23 0632 02/18/23 0806 02/19/23 0445 02/21/23 0441  WBC 22.0*   < > 15.4* 13.7* 14.3* 12.5* 10.3  HGB 12.5   < > 9.9* 10.2* 10.0* 9.5* 9.9*  HCT 39.5   < > 31.6* 31.5* 30.5*  29.6* 30.9*  PLT 341   < > 259 252 201 189 227  MCV 95.9   < > 96.3 94.6 92.7 94.3 93.9  MCH 30.3   < > 30.2 30.6 30.4 30.3 30.1  MCHC 31.6   < > 31.3 32.4 32.8 32.1 32.0  RDW 13.7   < > 13.9 13.9 13.9 14.0 13.8  LYMPHSABS 1.4  --   --  1.1 2.3 1.8 1.5  MONOABS 1.2*  --   --  0.8 0.6 0.5 0.6  EOSABS 0.0  --   --  0.1 0.1 0.2 0.1  BASOSABS 0.0  --   --  0.0 0.0 0.1 0.0   < > = values in this interval not displayed.    Recent Labs  Lab 02/14/23 2130 02/14/23 2225 02/15/23 0438 02/15/23 0452 02/15/23 0657 02/15/23 2011 02/16/23 0606 02/16/23 0827 02/16/23 1036 02/17/23 6295 02/18/23 0806 02/19/23 0445 02/20/23 0449 02/21/23 0441  NA 144  --  146*  --   --   --  148*  --   --  144 141 140 140 144  K 3.4*  --  2.9*  --    < >  --  3.4*  --   --  3.1* 2.5* 3.9 3.3* 3.2*  CL 104  --  109  --   --   --  114*  --   --  112* 105 109 107 109  CO2 23  --  23  --   --   --  22  --   --  23 25 22 22 27   ANIONGAP 17*  --  14  --   --   --  12  --   --  9 11 9 11 8   GLUCOSE 197*  --  147*  --   --   --  126*  --   --  144* 115* 109* 116* 117*  BUN 27*  --  27*  --   --   --  21  --   --  11 5* 7* 9 11  CREATININE 0.95  --  0.93  --   --   --  0.60  --   --  0.61 0.53 0.62 0.53 0.50  AST 21  --   --   --   --   --  20  --   --  20 22 29   --   --   ALT 18  --   --   --   --   --  17  --   --  17 20 23   --   --   ALKPHOS 61  --   --   --   --   --  52  --   --  52 47 43  --   --  BILITOT 1.1  --   --   --   --   --  0.8  --   --  0.6 0.5 0.4  --   --   ALBUMIN 3.1*  --   --   --   --   --  2.3*  --   --  2.3* 2.1* 2.1*  --   --   CRP  --   --   --   --   --   --  18.0*  --   --  10.8* 6.1* 3.9*  --   --   PROCALCITON  --   --   --   --   --   --  0.34  --   --  0.15 <0.10 <0.10  --   --   LATICACIDVEN  --  1.6  --   --   --   --   --   --   --   --   --   --   --   --   INR  --   --  1.6*  --   --  1.6*  --   --   --   --   --   --   --   --   TSH  --   --   --  0.321*  --   --   --   --   0.311*  --  0.213*  --   --   --   HGBA1C  --   --  5.9*  --   --   --   --   --   --   --   --   --   --   --   AMMONIA  --   --   --   --   --   --  15  --   --  24 19 28   --   --   BNP  --   --   --   --   --   --   --  548.5*  --  1,178.7* 776.4* 362.3*  --   --   MG  --   --   --   --    < >  --  2.0  --   --  1.5* 1.8 1.5* 2.1 1.7  PHOS  --   --   --   --   --   --  1.7*  --   --  2.4* 2.2* 2.9  --  2.9  CALCIUM 9.3  --  8.4*  --   --   --  8.1*  --   --  7.5* 6.9* 7.2* 7.6* 8.3*   < > = values in this interval not displayed.      Recent Labs  Lab 02/14/23 2225 02/15/23 0438 02/15/23 0452 02/15/23 0657 02/15/23 2011 02/16/23 0606 02/16/23 0827 02/16/23 1036 02/17/23 1610 02/18/23 0806 02/19/23 0445 02/20/23 0449 02/21/23 0441  CRP  --   --   --   --   --  18.0*  --   --  10.8* 6.1* 3.9*  --   --   PROCALCITON  --   --   --   --   --  0.34  --   --  0.15 <0.10 <0.10  --   --   LATICACIDVEN 1.6  --   --   --   --   --   --   --   --   --   --   --   --  INR  --  1.6*  --   --  1.6*  --   --   --   --   --   --   --   --   TSH  --   --  0.321*  --   --   --   --  0.311*  --  0.213*  --   --   --   HGBA1C  --  5.9*  --   --   --   --   --   --   --   --   --   --   --   AMMONIA  --   --   --   --   --  15  --   --  24 19 28   --   --   BNP  --   --   --   --   --   --  548.5*  --  1,178.7* 776.4* 362.3*  --   --   MG  --   --   --    < >  --  2.0  --   --  1.5* 1.8 1.5* 2.1 1.7  CALCIUM  --  8.4*  --   --   --  8.1*  --   --  7.5* 6.9* 7.2* 7.6* 8.3*   < > = values in this interval not displayed.    --------------------------------------------------------------------------------------------------------------- Lab Results  Component Value Date   CHOL 122 10/27/2019   HDL 50 10/27/2019   LDLCALC 52 10/27/2019   LDLDIRECT 66.0 06/16/2014   TRIG 113 10/27/2019   CHOLHDL 2.4 10/27/2019    Lab Results  Component Value Date   HGBA1C 5.9 (H) 02/15/2023   No results for  input(s): "TSH", "T4TOTAL", "FREET4", "T3FREE", "THYROIDAB" in the last 72 hours.    Micro Results Recent Results (from the past 240 hours)  Blood Culture (routine x 2)     Status: Abnormal   Collection Time: 02/14/23  9:33 PM   Specimen: BLOOD  Result Value Ref Range Status   Specimen Description BLOOD LEFT ANTECUBITAL  Final   Special Requests   Final    BOTTLES DRAWN AEROBIC AND ANAEROBIC Blood Culture results may not be optimal due to an inadequate volume of blood received in culture bottles   Culture  Setup Time   Final    GRAM POSITIVE COCCI IN CLUSTERS AEROBIC BOTTLE ONLY CRITICAL RESULT CALLED TO, READ BACK BY AND VERIFIED WITH: PHARMD CAREN AMEND ON 02/15/23 @ 1652 BY DRT    Culture (A)  Final    STAPHYLOCOCCUS HOMINIS UNABLE TO ISOLATE STAPHYLOCOCCUS EPIDERMIDIS WHICH WAS IDENTIFIED AS PRESENT BY [facility]. THE SIGNIFICANCE OF ISOLATING THIS ORGANISM FROM A SINGLE SET OF BLOOD CULTURES WHEN MULTIPLE SETS ARE DRAWN IS UNCERTAIN. PLEASE NOTIFY THE MICROBIOLOGY DEPARTMENT WITHIN ONE WEEK IF SPECIATION AND SENSITIVITIES ARE REQUIRED. Performed at [facility], [address], [place], [place] [address]    Report Status 02/17/2023 FINAL  Final  Blood Culture ID Panel (Reflexed)     Status: Abnormal   Collection Time: 02/14/23  9:33 PM  Result Value Ref Range Status   Enterococcus faecalis NOT DETECTED NOT DETECTED Final   Enterococcus Faecium NOT DETECTED NOT DETECTED Final   Listeria monocytogenes NOT DETECTED NOT DETECTED Final   Staphylococcus species DETECTED (A) NOT DETECTED Final    Comment: CRITICAL RESULT CALLED TO, READ BACK BY AND VERIFIED WITH: PHARMD CAREN AMEND ON 02/15/23 @  1652 BY DRT    Staphylococcus aureus (BC[facility]) NOT DETECTED NOT DETECTED Final   Staphylococcus epidermidis DETECTED (A) NOT DETECTED Final    Comment: Methicillin (oxacillin) resistant coagulase negative staphylococcus. Possible blood culture contaminant (unless isolated from more than one blood  culture draw or clinical case suggests pathogenicity). No antibiotic treatment is indicated for blood  culture contaminants. CRITICAL RESULT CALLED TO, READ BACK BY AND VERIFIED WITH: PHARMD CAREN AMEND ON 02/15/23 @ 1652 BY DRT    Staphylococcus lugdunensis NOT DETECTED NOT DETECTED Final   Streptococcus species NOT DETECTED NOT DETECTED Final   Streptococcus agalactiae NOT DETECTED NOT DETECTED Final   Streptococcus pneumoniae NOT DETECTED NOT DETECTED Final   Streptococcus pyogenes NOT DETECTED NOT DETECTED Final   A.calcoaceticus-baumannii NOT DETECTED NOT DETECTED Final   Bacteroides fragilis NOT DETECTED NOT DETECTED Final   Enterobacterales NOT DETECTED NOT DETECTED Final   Enterobacter cloacae complex NOT DETECTED NOT DETECTED Final   Escherichia coli NOT DETECTED NOT DETECTED Final   Klebsiella aerogenes NOT DETECTED NOT DETECTED Final   Klebsiella oxytoca NOT DETECTED NOT DETECTED Final   Klebsiella pneumoniae NOT DETECTED NOT DETECTED Final   Proteus species NOT DETECTED NOT DETECTED Final   Salmonella species NOT DETECTED NOT DETECTED Final   Serratia marcescens NOT DETECTED NOT DETECTED Final   Haemophilus influenzae NOT DETECTED NOT DETECTED Final   Neisseria meningitidis NOT DETECTED NOT DETECTED Final   Pseudomonas aeruginosa NOT DETECTED NOT DETECTED Final   Stenotrophomonas maltophilia NOT DETECTED NOT DETECTED Final   Candida albicans NOT DETECTED NOT DETECTED Final   Candida auris NOT DETECTED NOT DETECTED Final   Candida glabrata NOT DETECTED NOT DETECTED Final   Candida krusei NOT DETECTED NOT DETECTED Final   Candida parapsilosis NOT DETECTED NOT DETECTED Final   Candida tropicalis NOT DETECTED NOT DETECTED Final   Cryptococcus neoformans/gattii NOT DETECTED NOT DETECTED Final   Methicillin resistance mecA/C DETECTED (A) NOT DETECTED Final    Comment: CRITICAL RESULT CALLED TO, READ BACK BY AND VERIFIED WITH: PHARMD CAREN AMEND ON 02/15/23 @ 1652 BY  DRT Performed at [facility], [address], [place], [place] [address]   Resp panel by RT-PCR (RSV, Flu A&B, Covid) Anterior Nasal Swab     Status: None   Collection Time: 02/14/23 10:41 PM   Specimen: Anterior Nasal Swab  Result Value Ref Range Status   SARS Coronavirus 2 by RT PCR NEGATIVE NEGATIVE Final   Influenza A by PCR NEGATIVE NEGATIVE Final   Influenza B by PCR NEGATIVE NEGATIVE Final    Comment: (NOTE) The Xpert Xpress SARS-CoV-2/FLU/RSV plus assay is intended as an aid in the diagnosis of influenza from Nasopharyngeal swab specimens and should not be used as a sole basis for treatment. Nasal washings and aspirates are unacceptable for Xpert Xpress SARS-CoV-2/FLU/RSV testing.  Fact Sheet for Patients: BloggerCourse.com  Fact Sheet for Healthcare Providers: SeriousBroker.it  This test is not yet approved or cleared by the [place] FDA and has been authorized for detection and/or diagnosis of SARS-CoV-2 by FDA under an Emergency Use Authorization (EUA). This EUA will remain in effect (meaning this test can be used) for the duration of the COVID-19 declaration under Section 564(b)(1) of the Act, 21 U.S.C. section 360bbb-3(b)(1), unless the authorization is terminated or revoked.     Resp Syncytial Virus by PCR NEGATIVE NEGATIVE Final    Comment: (NOTE) Fact Sheet for Patients: BloggerCourse.com  Fact Sheet for Healthcare Providers: SeriousBroker.it  This test is not  yet approved or cleared by the [place] and has been authorized for detection and/or diagnosis of SARS-CoV-2 by FDA under an Emergency Use Authorization (EUA). This EUA will remain in effect (meaning this test can be used) for the duration of the COVID-19 declaration under Section 564(b)(1) of the Act, 21 U.S.C. section 360bbb-3(b)(1), unless the authorization is terminated  or revoked.  Performed at [facility], [address], [place], [place] [address]   Urine Culture     Status: Abnormal   Collection Time: 02/14/23 10:41 PM   Specimen: Urine, Catheterized  Result Value Ref Range Status   Specimen Description URINE, CATHETERIZED  Final   Special Requests   Final    NONE Reflexed from [id number] Performed at [facility], [address] [place], [place] [address]    Culture (A)  Final    >=100,000 COLONIES/mL ESCHERICHIA COLI Confirmed Extended Spectrum Beta-Lactamase Producer (ESBL).  In bloodstream infections from ESBL organisms, carbapenems are preferred over piperacillin/tazobactam. They are shown to have a lower risk of mortality.    Report Status 02/18/2023 FINAL  Final   Organism ID, Bacteria ESCHERICHIA COLI (A)  Final      Susceptibility   Escherichia coli - MIC*    AMPICILLIN >=32 RESISTANT Resistant     CEFAZOLIN >=64 RESISTANT Resistant     CEFEPIME >=32 RESISTANT Resistant     CEFTRIAXONE >=64 RESISTANT Resistant     CIPROFLOXACIN >=4 RESISTANT Resistant     GENTAMICIN <=1 SENSITIVE Sensitive     IMIPENEM <=0.25 SENSITIVE Sensitive     NITROFURANTOIN <=16 SENSITIVE Sensitive     TRIMETH/SULFA >=320 RESISTANT Resistant     AMPICILLIN/SULBACTAM >=32 RESISTANT Resistant     PIP/TAZO 16 SENSITIVE Sensitive ug/mL    * >=100,000 COLONIES/mL ESCHERICHIA COLI  Blood Culture (routine x 2)     Status: None   Collection Time: 02/14/23 10:43 PM   Specimen: BLOOD  Result Value Ref Range Status   Specimen Description BLOOD SITE NOT SPECIFIED  Final   Special Requests   Final    BOTTLES DRAWN AEROBIC AND ANAEROBIC Blood Culture adequate volume   Culture   Final    NO GROWTH 5 DAYS Performed at [facility], [address], [place], [place] [address]    Report Status 02/19/2023 FINAL  Final  MRSA Next Gen by PCR, Nasal     Status: None   Collection Time: 02/15/23  4:26 PM   Specimen: Nasal Mucosa; Nasal Swab  Result Value  Ref Range Status   MRSA by PCR Next Gen NOT DETECTED NOT DETECTED Final    Comment: (NOTE) The GeneXpert MRSA Assay (FDA approved for NASAL specimens only), is one component of a comprehensive MRSA colonization surveillance program. It is not intended to diagnose MRSA infection nor to guide or monitor treatment for MRSA infections. Test performance is not FDA approved in patients less than 37 years old. Performed at [facility], [address], [place], [place] [address]     Radiology Reports DG Abd Portable 1V Result Date: 02/18/2023 CLINICAL DATA:  78 year old female with history of constipation. EXAM: PORTABLE ABDOMEN - 1 VIEW COMPARISON:  No priors. FINDINGS: Gas and stool are seen scattered throughout the colon extending to the level of the distal rectum. No pathologic distension of small bowel is noted. No gross evidence of pneumoperitoneum. Markers from surgical mash project over the right side of the abdomen, presumably from remote hernia repair. Multiple old  healed fractures of the superior and inferior pubic rami bilaterally. Vascular calcifications are noted. IMPRESSION: 1. Nonobstructive bowel gas pattern. 2. Aortic atherosclerosis. Electronically Signed   By: Trudie Reed M.D.   On: 02/18/2023 07:42   DG Chest Port 1 View Result Date: 02/18/2023 CLINICAL DATA:  78 year old female with history of shortness of breath. EXAM: PORTABLE CHEST 1 VIEW COMPARISON:  Chest x-ray 02/16/2023. FINDINGS: Lung volumes are very low. No definite consolidative airspace disease. No definite pleural effusions. No pneumothorax. No evidence of pulmonary edema. Heart size appears borderline enlarged. The patient is rotated to the left on today's exam, resulting in distortion of the mediastinal contours and reduced diagnostic sensitivity and specificity for mediastinal pathology. Atherosclerotic calcifications are noted in the thoracic aorta. Status post TAVR. IMPRESSION: 1. Low lung volumes without  radiographic evidence of acute cardiopulmonary disease. 2. Aortic atherosclerosis. Electronically Signed   By: Trudie Reed M.D.   On: 02/18/2023 07:40      Signature  -   Huey Bienenstock M.D on 02/21/2023 at 1:31 PM   -  To page go to [email or web address]

## 2023-02-21 NOTE — TOC Progression Note (Signed)
Transition of Care [facility]) - Progression Note    Patient Details  Name: Katherine [name] MRN: [id number] Date of Birth: [birth year]  Transition of Care [facility]) CM/SW Contact  Mearl Latin, LCSW Phone Number: 02/21/2023, 3:39 PM  Clinical Narrative:    CSW following for return to [facility] potentially Friday.    Expected Discharge Plan: Skilled Nursing Facility Barriers to Discharge: Continued Medical Work up  Expected Discharge Plan and Services In-house Referral: Clinical Social Work   Post Acute Care Choice: Skilled Nursing Facility Living arrangements for the past 2 months: Skilled Nursing Facility                                       Social Determinants of Health (SDOH) Interventions SDOH Screenings   Food Insecurity: Patient Unable To Answer (02/16/2023)  Housing: Patient Unable To Answer (02/16/2023)  Transportation Needs: Patient Unable To Answer (02/16/2023)  Utilities: Not At Risk (02/05/2023)  Depression (PHQ2-9): Low Risk  (08/28/2019)  Social Connections: Patient Unable To Answer (02/16/2023)  Recent Concern: Social Connections - Socially Isolated (02/05/2023)  Tobacco Use: Low Risk  (02/15/2023)    Readmission Risk Interventions     No data to display

## 2023-02-21 NOTE — Progress Notes (Signed)
Physical Therapy Treatment Patient Details Name: Katherine [name] MRN: [id number] DOB: Dec 30, 1945 Today's Date: 02/21/2023   History of Present Illness Pt is a 78 y.o. female admitted 1/15 with AMS and hypoxia. She was recently admitted on February 05, 2023 for acute delirium in the setting of COVID. PMH: CAD status post PCI, diabetes mellitus type 2, status post TAVR, dementia with behavioral disturbances, GERD    PT Comments  Pt with similar presentation to previous session. No change in DC/DME recs at this time. PT will continue to follow.    If plan is discharge home, recommend the following: Two people to help with walking and/or transfers;Two people to help with bathing/dressing/bathroom   Can travel by private vehicle     No  Equipment Recommendations  None recommended by PT    Recommendations for Other Services       Precautions / Restrictions Precautions Precautions: Fall Restrictions Weight Bearing Restrictions Per Provider Order: No     Mobility  Bed Mobility Overal bed mobility: Needs Assistance Bed Mobility: Supine to Sit, Sit to Supine     Supine to sit: Total assist Sit to supine: Total assist   General bed mobility comments: no active assist from pt for transition to/from EOB    Transfers                   General transfer comment: unable to safely transition OOB    Ambulation/Gait               General Gait Details: deferred   Stairs             Wheelchair Mobility     Tilt Bed    Modified Rankin (Stroke Patients Only)       Balance Overall balance assessment: Needs assistance Sitting-balance support: Feet supported, No upper extremity supported Sitting balance-Leahy Scale: Zero   Postural control: Posterior lean                                  Cognition Arousal: Alert Behavior During Therapy: Flat affect Overall Cognitive Status: No family/caregiver present to determine baseline cognitive  functioning Area of Impairment: Orientation, Attention, Memory, Following commands, Awareness, Problem solving                 Orientation Level: Disoriented to, Time, Situation, Place Current Attention Level: Focused Memory: Decreased recall of precautions, Decreased short-term memory Following Commands: Follows one step commands inconsistently, Follows one step commands with increased time   Awareness: Intellectual Problem Solving: Slow processing, Decreased initiation, Requires verbal cues, Difficulty sequencing General Comments: Primarily nonverbal. Little to no eye contact. Not following commands. Eyes open/alert. Pt would respond yes/no to some questions and giggle other times. No other spoken words.        Exercises      General Comments General comments (skin integrity, edema, etc.): VSS      Pertinent Vitals/Pain Pain Assessment Pain Assessment: No/denies pain    Home Living                          Prior Function            PT Goals (current goals can now be found in the care plan section) Progress towards PT goals: Progressing toward goals    Frequency    Min 1X/week      PT Plan  Co-evaluation              AM-PAC PT "6 Clicks" Mobility   Outcome Measure  Help needed turning from your back to your side while in a flat bed without using bedrails?: Total Help needed moving from lying on your back to sitting on the side of a flat bed without using bedrails?: Total Help needed moving to and from a bed to a chair (including a wheelchair)?: Total Help needed standing up from a chair using your arms (e.g., wheelchair or bedside chair)?: Total Help needed to walk in hospital room?: Total Help needed climbing 3-5 steps with a railing? : Total 6 Click Score: 6    End of Session Equipment Utilized During Treatment: Gait belt Activity Tolerance: Patient tolerated treatment well Patient left: in bed;with call bell/phone within  reach;with bed alarm set Nurse Communication: Mobility status PT Visit Diagnosis: Other abnormalities of gait and mobility (R26.89);Muscle weakness (generalized) (M62.81)     Time: 1610-9604 PT Time Calculation (min) (ACUTE ONLY): 14 min  Charges:    $Therapeutic Activity: 8-22 mins PT General Charges $$ ACUTE PT VISIT: 1 Visit                     Shela Nevin, PT, DPT Acute Rehab Services [phone]    Gladys Damme 02/21/2023, 12:17 PM

## 2023-02-21 NOTE — Plan of Care (Signed)
  Problem: Coping: Goal: Ability to adjust to condition or change in health will improve Outcome: Progressing   Problem: Metabolic: Goal: Ability to maintain appropriate glucose levels will improve Outcome: Progressing   Problem: Nutritional: Goal: Maintenance of adequate nutrition will improve Outcome: Progressing   Problem: Skin Integrity: Goal: Risk for impaired skin integrity will decrease Outcome: Progressing   Problem: Respiratory: Goal: Ability to maintain adequate ventilation will improve Outcome: Progressing

## 2023-02-22 DIAGNOSIS — A419 Sepsis, unspecified organism: Secondary | ICD-10-CM | POA: Diagnosis not present

## 2023-02-22 LAB — GLUCOSE, CAPILLARY
Glucose-Capillary: 105 mg/dL — ABNORMAL HIGH (ref 70–99)
Glucose-Capillary: 165 mg/dL — ABNORMAL HIGH (ref 70–99)
Glucose-Capillary: 157 mg/dL — ABNORMAL HIGH (ref 70–99)
Glucose-Capillary: 136 mg/dL — ABNORMAL HIGH (ref 70–99)

## 2023-02-22 NOTE — Plan of Care (Signed)
  Problem: Skin Integrity: Goal: Risk for impaired skin integrity will decrease Outcome: Progressing   Problem: Tissue Perfusion: Goal: Adequacy of tissue perfusion will improve Outcome: Progressing   Problem: Fluid Volume: Goal: Hemodynamic stability will improve Outcome: Progressing

## 2023-02-22 NOTE — TOC Progression Note (Addendum)
Transition of Care [facility]) - Progression Note    Patient Details  Name: IWANA DANSIE MRN: [id number] Date of Birth: 11-01-1945  [facility] CM/SW Contact  Mearl Latin, LCSW Phone Number: 02/22/2023, 2:23 PM  Clinical Narrative:    CSW updated [facility] of likely discharge tomorrow per MD. CSW spoke with patient's sister and provided update.    Expected Discharge Plan: Skilled Nursing Facility Barriers to Discharge: Continued Medical Work up  Expected Discharge Plan and Services In-house Referral: Clinical Social Work   Post Acute Care Choice: Skilled Nursing Facility Living arrangements for the past 2 months: Skilled Nursing Facility                                       Social Determinants of Health (SDOH) Interventions SDOH Screenings   Food Insecurity: Patient Unable To Answer (02/16/2023)  Housing: Patient Unable To Answer (02/16/2023)  Transportation Needs: Patient Unable To Answer (02/16/2023)  Utilities: Not At Risk (02/05/2023)  Depression (PHQ2-9): Low Risk  (08/28/2019)  Social Connections: Patient Unable To Answer (02/16/2023)  Recent Concern: Social Connections - Socially Isolated (02/05/2023)  Tobacco Use: Low Risk  (02/15/2023)    Readmission Risk Interventions     No data to display

## 2023-02-22 NOTE — Plan of Care (Signed)
  Problem: Education: Goal: Ability to describe self-care measures that may prevent or decrease complications (Diabetes Survival Skills Education) will improve Outcome: Progressing Goal: Individualized Educational Video(s) Outcome: Progressing   Problem: Coping: Goal: Ability to adjust to condition or change in health will improve Outcome: Progressing   Problem: Fluid Volume: Goal: Ability to maintain a balanced intake and output will improve Outcome: Progressing   Problem: Health Behavior/Discharge Planning: Goal: Ability to identify and utilize available resources and services will improve Outcome: Progressing Goal: Ability to manage health-related needs will improve Outcome: Progressing   Problem: Metabolic: Goal: Ability to maintain appropriate glucose levels will improve Outcome: Progressing   Problem: Nutritional: Goal: Maintenance of adequate nutrition will improve Outcome: Progressing Goal: Progress toward achieving an optimal weight will improve Outcome: Progressing   Problem: Skin Integrity: Goal: Risk for impaired skin integrity will decrease Outcome: Progressing   Problem: Tissue Perfusion: Goal: Adequacy of tissue perfusion will improve Outcome: Progressing   Problem: Fluid Volume: Goal: Hemodynamic stability will improve Outcome: Progressing   Problem: Clinical Measurements: Goal: Diagnostic test results will improve Outcome: Progressing Goal: Signs and symptoms of infection will decrease Outcome: Progressing   Problem: Education: Goal: Ability to describe self-care measures that may prevent or decrease complications (Diabetes Survival Skills Education) will improve Outcome: Progressing Goal: Individualized Educational Video(s) Outcome: Progressing

## 2023-02-22 NOTE — Plan of Care (Signed)

## 2023-02-22 NOTE — Progress Notes (Signed)
PROGRESS NOTE                                                                                                                                                                                                             Patient Demographics:    Katherine [name], is a 78 y.o. female, DOB - [birth year], ZOX:[id number]  Outpatient Primary MD for the patient is Ronnald Collum    LOS - 8  Admit date - 02/14/2023    Chief Complaint  Patient presents with   Code Sepsis       Brief Narrative (HPI from H&P)     78 year old female with history of coronary disease status post PCI, diabetes type 2, TAVR, dementia with behavioral disturbances, GERD who presented from skilled nursing facility with complaint of shortness of breath, fever, cough, confusion increased from baseline.  On presentation, she was hypoxic requiring 2 L of oxygen, febrile, hypotensive.  She was diagnosed with sepsis due to UTI and pneumonia and admitted to the hospital.   Subjective:   No significant events overnight as discussed with staff, she is with dementia, she denies any complaints this morning    Assessment  & Plan :   Sepsis secondary to UTI and pneumonia. . -Sepsis, present on admission -Being treated with empiric IV antibiotics, IV fluids, sepsis pathophysiology has improved. - blood cultures 1 out of 2 appears to be contaminant. -  Urine E. coli appears to be pan resistant, switched to carbapenem on 02/19/2023, currently clinically so far she has showed serial improvement.  Will give antibiotics for at least 5 days, last dose 1/25.   Acute hypoxic respiratory  failure:  -Secondary to pneumonia/recent COVID.  On 2 L.  Continue to wean.  Clinically much improved. -PMH appreciated, on dysphagia 3 with thin liquid   Hypokalemia, hypomagnesemia, hypophosphatemia: Replaced.   History of coronary artery disease: Status post PCI.  Metoprolol on hold  due to hypotension.  On Crestor, Eliquis   Acute metabolic encephalopathy:  Secondary to sepsis/pneumonia.  CT head did not show any acute findings.  He had recent MRI done as well with no acute finding, I have discussed with sister, mentation has been improving but not back to baseline as when she saw her by Monday.   History of dementia: On Risperdal, Cogentin, Namenda.  Frequent reorientation, delirium  precautions   Hypertension: Home metoprolol, hydrochlorothiazide on hold due to hypotension   GERD: Continue PPI  Hypokalemia -Repleted   Paroxysmal A-fib: Takes metoprolol, Eliquis.  Monitor on telemetry   Hypohyroidism.  TSH suppressed, free T4 elevated, hold Synthroid here upon discharge cut down the home dose  Diabetes type 2: Takes metformin.  Currently on sliding scale.  Monitor blood sugars  CBG (last 3)  Recent Labs    02/21/23 2116 02/22/23 0821 02/22/23 1213  GLUCAP 146* 105* 165*         Condition - Fair  Family Communication  : None at bedside D/W sister by phone 1/22.  Code Status :  DNR  Consults  :  None  PUD Prophylaxis :  PPI   Procedures  :            Disposition Plan  :    Status is: Inpatient  DVT Prophylaxis  :     apixaban (ELIQUIS) tablet 5 mg     Lab Results  Component Value Date   PLT 227 02/21/2023    Diet :  Diet Order             DIET DYS 3 Room service appropriate? Yes; Fluid consistency: Thin  Diet effective now                    Inpatient Medications  Scheduled Meds:  apixaban  5 mg Oral BID   benztropine  0.5 mg Oral BID   memantine  28 mg Oral Daily   And   donepezil  10 mg Oral Daily   insulin aspart  0-9 Units Subcutaneous TID WC   magnesium oxide  400 mg Oral Daily   metoprolol tartrate  50 mg Oral BID   pantoprazole  40 mg Oral Daily   risperiDONE  2 mg Oral QHS   rosuvastatin  40 mg Oral QHS   zinc oxide  1 Application Topical BID   Continuous Infusions:  meropenem (MERREM) IV Stopped  (02/22/23 1222)   PRN Meds:.acetaminophen **OR** acetaminophen, diltiazem, metoprolol tartrate   Objective:   Vitals:   02/22/23 0359 02/22/23 0800 02/22/23 0910 02/22/23 1200  BP:  115/64  109/60  Pulse:  66 100 69  Resp:  (!) 24    Temp: 99.4 F (37.4 C) 98.3 F (36.8 C)  98.8 F (37.1 C)  TempSrc: Axillary Oral  Oral  SpO2:  99%  98%  Weight:      Height:        Wt Readings from Last 3 Encounters:  02/14/23 57.8 kg  02/04/23 55 kg  06/15/21 56 kg     Intake/Output Summary (Last 24 hours) at 02/22/2023 1459 Last data filed at 02/22/2023 1340 Gross per 24 hour  Intake 1310 ml  Output --  Net 1310 ml     Physical Exam  Awake Alert, frail, deconditioned, demented, pleasant in no apparent distress, oriented x 1, she is thinks she is at [facility] Symmetrical Chest wall movement, Good air movement bilaterally, CTAB RRR,No Gallops,Rubs or new Murmurs, No Parasternal Heave +ve B.Sounds, Abd Soft, No tenderness, No rebound - guarding or rigidity. No Cyanosis, Clubbing or edema, No new Rash or bruise     RN pressure injury documentation: Pressure Injury 02/15/23 Buttocks Medial;Right Deep Tissue Pressure Injury - Purple or maroon localized area of discolored intact skin or blood-filled blister due to damage of underlying soft tissue from pressure and/or shear. evolving dtpi 3 cm x 3 cm  w (Active)  02/15/23   Location: Buttocks  Location Orientation: Medial;Right  Staging: Deep Tissue Pressure Injury - Purple or maroon localized area of discolored intact skin or blood-filled blister due to damage of underlying soft tissue from pressure and/or shear.  Wound Description (Comments): evolving dtpi 3 cm x 3 cm with partial thickness skin loss  Present on Admission: Yes  Dressing Type Foam - Lift dressing to assess site every shift 02/21/23 0709      Data Review:    Recent Labs  Lab 02/16/23 0606 02/17/23 0632 02/18/23 0806 02/19/23 0445 02/21/23 0441  WBC 15.4*  13.7* 14.3* 12.5* 10.3  HGB 9.9* 10.2* 10.0* 9.5* 9.9*  HCT 31.6* 31.5* 30.5* 29.6* 30.9*  PLT 259 252 201 189 227  MCV 96.3 94.6 92.7 94.3 93.9  MCH 30.2 30.6 30.4 30.3 30.1  MCHC 31.3 32.4 32.8 32.1 32.0  RDW 13.9 13.9 13.9 14.0 13.8  LYMPHSABS  --  1.1 2.3 1.8 1.5  MONOABS  --  0.8 0.6 0.5 0.6  EOSABS  --  0.1 0.1 0.2 0.1  BASOSABS  --  0.0 0.0 0.1 0.0    Recent Labs  Lab 02/15/23 1626 02/15/23 2011 02/16/23 0606 02/16/23 0827 02/16/23 1036 02/17/23 0632 02/18/23 0806 02/19/23 0445 02/20/23 0449 02/21/23 0441  NA   < >  --  148*  --   --  144 141 140 140 144  K  --   --  3.4*  --   --  3.1* 2.5* 3.9 3.3* 3.2*  CL   < >  --  114*  --   --  112* 105 109 107 109  CO2   < >  --  22  --   --  23 25 22 22 27   ANIONGAP   < >  --  12  --   --  9 11 9 11 8   GLUCOSE   < >  --  126*  --   --  144* 115* 109* 116* 117*  BUN   < >  --  21  --   --  11 5* 7* 9 11  CREATININE   < >  --  0.60  --   --  0.61 0.53 0.62 0.53 0.50  AST  --   --  20  --   --  20 22 29   --   --   ALT  --   --  17  --   --  17 20 23   --   --   ALKPHOS  --   --  52  --   --  52 47 43  --   --   BILITOT  --   --  0.8  --   --  0.6 0.5 0.4  --   --   ALBUMIN  --   --  2.3*  --   --  2.3* 2.1* 2.1*  --   --   CRP  --   --  18.0*  --   --  10.8* 6.1* 3.9*  --   --   PROCALCITON  --   --  0.34  --   --  0.15 <0.10 <0.10  --   --   INR  --  1.6*  --   --   --   --   --   --   --   --   TSH  --   --   --   --  0.311*  --  0.213*  --   --   --   AMMONIA  --   --  15  --   --  24 19 28   --   --   BNP  --   --   --  548.5*  --  1,178.7* 776.4* 362.3*  --   --   MG   < >  --  2.0  --   --  1.5* 1.8 1.5* 2.1 1.7  PHOS  --   --  1.7*  --   --  2.4* 2.2* 2.9  --  2.9  CALCIUM   < >  --  8.1*  --   --  7.5* 6.9* 7.2* 7.6* 8.3*   < > = values in this interval not displayed.      Recent Labs  Lab 02/15/23 2011 02/16/23 0606 02/16/23 0606 02/16/23 0827 02/16/23 1036 02/17/23 0272 02/18/23 0806 02/19/23 0445  02/20/23 0449 02/21/23 0441  CRP  --  18.0*  --   --   --  10.8* 6.1* 3.9*  --   --   PROCALCITON  --  0.34  --   --   --  0.15 <0.10 <0.10  --   --   INR 1.6*  --   --   --   --   --   --   --   --   --   TSH  --   --   --   --  0.311*  --  0.213*  --   --   --   AMMONIA  --  15  --   --   --  24 19 28   --   --   BNP  --   --   --  548.5*  --  1,178.7* 776.4* 362.3*  --   --   MG  --  2.0   < >  --   --  1.5* 1.8 1.5* 2.1 1.7  CALCIUM  --  8.1*   < >  --   --  7.5* 6.9* 7.2* 7.6* 8.3*   < > = values in this interval not displayed.    --------------------------------------------------------------------------------------------------------------- Lab Results  Component Value Date   CHOL 122 10/27/2019   HDL 50 10/27/2019   LDLCALC 52 10/27/2019   LDLDIRECT 66.0 06/16/2014   TRIG 113 10/27/2019   CHOLHDL 2.4 10/27/2019    Lab Results  Component Value Date   HGBA1C 5.9 (H) 02/15/2023   No results for input(s): "TSH", "T4TOTAL", "FREET4", "T3FREE", "THYROIDAB" in the last 72 hours.    Micro Results Recent Results (from the past 240 hours)  Blood Culture (routine x 2)     Status: Abnormal   Collection Time: 02/14/23  9:33 PM   Specimen: BLOOD  Result Value Ref Range Status   Specimen Description BLOOD LEFT ANTECUBITAL  Final   Special Requests   Final    BOTTLES DRAWN AEROBIC AND ANAEROBIC Blood Culture results may not be optimal due to an inadequate volume of blood received in culture bottles   Culture  Setup Time   Final    GRAM POSITIVE COCCI IN CLUSTERS AEROBIC BOTTLE ONLY CRITICAL RESULT CALLED TO, READ BACK BY AND VERIFIED WITH: PHARMD CAREN AMEND ON 02/15/23 @ 1652 BY DRT    Culture (A)  Final    STAPHYLOCOCCUS HOMINIS UNABLE TO ISOLATE STAPHYLOCOCCUS EPIDERMIDIS WHICH WAS IDENTIFIED AS PRESENT BY BCID. THE SIGNIFICANCE OF ISOLATING THIS ORGANISM FROM A  SINGLE SET OF BLOOD CULTURES WHEN MULTIPLE SETS ARE DRAWN IS UNCERTAIN. PLEASE NOTIFY THE MICROBIOLOGY DEPARTMENT  WITHIN ONE WEEK IF SPECIATION AND SENSITIVITIES ARE REQUIRED. Performed at [facility], [address], [place], [place] [address]    Report Status 02/17/2023 FINAL  Final  Blood Culture ID Panel (Reflexed)     Status: Abnormal   Collection Time: 02/14/23  9:33 PM  Result Value Ref Range Status   Enterococcus faecalis NOT DETECTED NOT DETECTED Final   Enterococcus Faecium NOT DETECTED NOT DETECTED Final   Listeria monocytogenes NOT DETECTED NOT DETECTED Final   Staphylococcus species DETECTED (A) NOT DETECTED Final    Comment: CRITICAL RESULT CALLED TO, READ BACK BY AND VERIFIED WITH: PHARMD CAREN AMEND ON 02/15/23 @ 1652 BY DRT    Staphylococcus aureus (BCID) NOT DETECTED NOT DETECTED Final   Staphylococcus epidermidis DETECTED (A) NOT DETECTED Final    Comment: Methicillin (oxacillin) resistant coagulase negative staphylococcus. Possible blood culture contaminant (unless isolated from more than one blood culture draw or clinical case suggests pathogenicity). No antibiotic treatment is indicated for blood  culture contaminants. CRITICAL RESULT CALLED TO, READ BACK BY AND VERIFIED WITH: PHARMD CAREN AMEND ON 02/15/23 @ 1652 BY DRT    Staphylococcus lugdunensis NOT DETECTED NOT DETECTED Final   Streptococcus species NOT DETECTED NOT DETECTED Final   Streptococcus agalactiae NOT DETECTED NOT DETECTED Final   Streptococcus pneumoniae NOT DETECTED NOT DETECTED Final   Streptococcus pyogenes NOT DETECTED NOT DETECTED Final   A.calcoaceticus-baumannii NOT DETECTED NOT DETECTED Final   Bacteroides fragilis NOT DETECTED NOT DETECTED Final   Enterobacterales NOT DETECTED NOT DETECTED Final   Enterobacter cloacae complex NOT DETECTED NOT DETECTED Final   Escherichia coli NOT DETECTED NOT DETECTED Final   Klebsiella aerogenes NOT DETECTED NOT DETECTED Final   Klebsiella oxytoca NOT DETECTED NOT DETECTED Final   Klebsiella pneumoniae NOT DETECTED NOT DETECTED Final   Proteus species NOT  DETECTED NOT DETECTED Final   Salmonella species NOT DETECTED NOT DETECTED Final   Serratia marcescens NOT DETECTED NOT DETECTED Final   Haemophilus influenzae NOT DETECTED NOT DETECTED Final   Neisseria meningitidis NOT DETECTED NOT DETECTED Final   Pseudomonas aeruginosa NOT DETECTED NOT DETECTED Final   Stenotrophomonas maltophilia NOT DETECTED NOT DETECTED Final   Candida albicans NOT DETECTED NOT DETECTED Final   Candida auris NOT DETECTED NOT DETECTED Final   Candida glabrata NOT DETECTED NOT DETECTED Final   Candida krusei NOT DETECTED NOT DETECTED Final   Candida parapsilosis NOT DETECTED NOT DETECTED Final   Candida tropicalis NOT DETECTED NOT DETECTED Final   Cryptococcus neoformans/gattii NOT DETECTED NOT DETECTED Final   Methicillin resistance mecA/C DETECTED (A) NOT DETECTED Final    Comment: CRITICAL RESULT CALLED TO, READ BACK BY AND VERIFIED WITH: PHARMD CAREN AMEND ON 02/15/23 @ 1652 BY DRT Performed at [facility], [address], [place], [place] [address]   Resp panel by RT-PCR (RSV, Flu A&B, Covid) Anterior Nasal Swab     Status: None   Collection Time: 02/14/23 10:41 PM   Specimen: Anterior Nasal Swab  Result Value Ref Range Status   SARS Coronavirus 2 by RT PCR NEGATIVE NEGATIVE Final   Influenza A by PCR NEGATIVE NEGATIVE Final   Influenza B by PCR NEGATIVE NEGATIVE Final    Comment: (NOTE) The Xpert Xpress SARS-CoV-2/FLU/RSV plus assay is intended as an aid in the diagnosis of influenza from Nasopharyngeal swab specimens and should not be used as a  sole basis for treatment. Nasal washings and aspirates are unacceptable for Xpert Xpress SARS-CoV-2/FLU/RSV testing.  Fact Sheet for Patients: BloggerCourse.com  Fact Sheet for Healthcare Providers: SeriousBroker.it  This test is not yet approved or cleared by the [place] FDA and has been authorized for detection and/or diagnosis of SARS-CoV-2  by FDA under an Emergency Use Authorization (EUA). This EUA will remain in effect (meaning this test can be used) for the duration of the COVID-19 declaration under Section 564(b)(1) of the Act, 21 U.S.C. section 360bbb-3(b)(1), unless the authorization is terminated or revoked.     Resp Syncytial Virus by PCR NEGATIVE NEGATIVE Final    Comment: (NOTE) Fact Sheet for Patients: BloggerCourse.com  Fact Sheet for Healthcare Providers: SeriousBroker.it  This test is not yet approved or cleared by the [place] FDA and has been authorized for detection and/or diagnosis of SARS-CoV-2 by FDA under an Emergency Use Authorization (EUA). This EUA will remain in effect (meaning this test can be used) for the duration of the COVID-19 declaration under Section 564(b)(1) of the Act, 21 U.S.C. section 360bbb-3(b)(1), unless the authorization is terminated or revoked.  Performed at [facility], [address], [place], [place] [address]   Urine Culture     Status: Abnormal   Collection Time: 02/14/23 10:41 PM   Specimen: Urine, Catheterized  Result Value Ref Range Status   Specimen Description URINE, CATHETERIZED  Final   Special Requests   Final    NONE Reflexed from [phone] [id number] Performed at [facility], [address] [place], [place] [address]    Culture (A)  Final    >=100,000 COLONIES/mL ESCHERICHIA COLI Confirmed Extended Spectrum Beta-Lactamase Producer (ESBL).  In bloodstream infections from ESBL organisms, carbapenems are preferred over piperacillin/tazobactam. They are shown to have a lower risk of mortality.    Report Status 02/18/2023 FINAL  Final   Organism ID, Bacteria ESCHERICHIA COLI (A)  Final      Susceptibility   Escherichia coli - MIC*    AMPICILLIN >=32 RESISTANT Resistant     CEFAZOLIN >=64 RESISTANT Resistant     CEFEPIME >=32 RESISTANT Resistant     CEFTRIAXONE >=64 RESISTANT Resistant      CIPROFLOXACIN >=4 RESISTANT Resistant     GENTAMICIN <=1 SENSITIVE Sensitive     IMIPENEM <=0.25 SENSITIVE Sensitive     NITROFURANTOIN <=16 SENSITIVE Sensitive     TRIMETH/SULFA >=320 RESISTANT Resistant     AMPICILLIN/SULBACTAM >=32 RESISTANT Resistant     PIP/TAZO 16 SENSITIVE Sensitive ug/mL    * >=100,000 COLONIES/mL ESCHERICHIA COLI  Blood Culture (routine x 2)     Status: None   Collection Time: 02/14/23 10:43 PM   Specimen: BLOOD  Result Value Ref Range Status   Specimen Description BLOOD SITE NOT SPECIFIED  Final   Special Requests   Final    BOTTLES DRAWN AEROBIC AND ANAEROBIC Blood Culture adequate volume   Culture   Final    NO GROWTH 5 DAYS Performed at [facility], [address] [place], [place] [address]    Report Status 02/19/2023 FINAL  Final  MRSA Next Gen by PCR, Nasal     Status: None   Collection Time: 02/15/23  4:26 PM   Specimen: Nasal Mucosa; Nasal Swab  Result Value Ref Range Status   MRSA by PCR Next Gen NOT DETECTED NOT DETECTED Final    Comment: (NOTE) The GeneXpert MRSA Assay (FDA approved for NASAL specimens only), is one component of a  comprehensive MRSA colonization surveillance program. It is not intended to diagnose MRSA infection nor to guide or monitor treatment for MRSA infections. Test performance is not FDA approved in patients less than 41 years old. Performed at [facility], [address] [place], [place] [address]     Radiology Reports No results found.     Signature  -   Huey Bienenstock M.D on 02/22/2023 at 2:59 PM   -  To page go to [email or web address]

## 2023-02-22 NOTE — Telephone Encounter (Signed)
Returned call to sister [name]. Office visit notes reflecting stopping Eliquis by Tereso Newcomer, PA on 12/13/22 sent to PCP office per request at number provided. Moved out current scheduled appt with Excell Seltzer per her request. She states pt still currently admitted and not sure will be discharged by 02/27/23 and even if is, doesn't feel like she will be up to coming out for the appt by then. Moved out 2 weeks, no further needs.

## 2023-02-23 DIAGNOSIS — A419 Sepsis, unspecified organism: Secondary | ICD-10-CM | POA: Diagnosis not present

## 2023-02-23 LAB — CBC
HCT: 30.6 % — ABNORMAL LOW (ref 36.0–46.0)
Hemoglobin: 9.7 g/dL — ABNORMAL LOW (ref 12.0–15.0)
MCH: 30.1 pg (ref 26.0–34.0)
MCHC: 31.7 g/dL (ref 30.0–36.0)
MCV: 95 fL (ref 80.0–100.0)
Platelets: 173 10*3/uL (ref 150–400)
RBC: 3.22 MIL/uL — ABNORMAL LOW (ref 3.87–5.11)
RDW: 14.3 % (ref 11.5–15.5)
WBC: 11.1 10*3/uL — ABNORMAL HIGH (ref 4.0–10.5)
nRBC: 0 % (ref 0.0–0.2)

## 2023-02-23 LAB — GLUCOSE, CAPILLARY
Glucose-Capillary: 111 mg/dL — ABNORMAL HIGH (ref 70–99)
Glucose-Capillary: 163 mg/dL — ABNORMAL HIGH (ref 70–99)

## 2023-02-23 LAB — BASIC METABOLIC PANEL
Anion gap: 9 (ref 5–15)
BUN: 22 mg/dL (ref 8–23)
CO2: 29 mmol/L (ref 22–32)
Calcium: 8.7 mg/dL — ABNORMAL LOW (ref 8.9–10.3)
Chloride: 109 mmol/L (ref 98–111)
Creatinine, Ser: 0.6 mg/dL (ref 0.44–1.00)
GFR, Estimated: >60 mL/min (ref >60)
Glucose, Bld: 124 mg/dL — ABNORMAL HIGH (ref 70–99)
Potassium: 4 mmol/L (ref 3.5–5.1)
Sodium: 147 mmol/L — ABNORMAL HIGH (ref 135–145)

## 2023-02-23 LAB — SODIUM: Sodium: 143 mmol/L (ref 135–145)

## 2023-02-23 MED ORDER — METOPROLOL TARTRATE 50 MG PO TABS: 50.0000 mg | ORAL_TABLET | Freq: Two times a day (BID) | ORAL | Status: AC

## 2023-02-23 MED ORDER — DEXTROSE 5 % IV SOLN
INTRAVENOUS | Status: AC
  Administered 2023-02-23: 1000 mL via INTRAVENOUS

## 2023-02-23 NOTE — TOC Transition Note (Signed)
Transition of Care [facility]) - Discharge Note   Patient Details  Name: Katherine [name] MRN: [id number] Date of Birth: 1945/09/08  Transition of [facility]) CM/SW Contact:  Mearl Latin, LCSW Phone Number: 02/23/2023, 2:49 PM   Clinical Narrative:    Patient will DC to: [facility] Anticipated DC date: 02/23/23 Family notified: Sister, [name] Transport by: Sharin Mons   Per MD patient ready for DC to [facility]. RN to call report prior to discharge [phone] room 226B). RN, patient, patient's family, and facility notified of DC. Discharge Summary and FL2 sent to facility. DC packet on chart including signed DNR. Ambulance transport requested for patient.   CSW will sign off for now as social work intervention is no longer needed. Please consult [place] again if new needs arise.     Final next level of care: Skilled Nursing Facility Barriers to Discharge: Barriers Resolved   Patient Goals and CMS Choice Patient states their goals for this hospitalization and ongoing recovery are:: Return to ltc          Discharge Placement   Existing PASRR number confirmed : 02/23/23          Patient chooses bed at: [facility] Patient to be transferred to facility by: [facility] Name of family member notified: Sister Patient and family notified of of transfer: 02/23/23  Discharge Plan and Services Additional resources added to the After Visit Summary for   In-house Referral: Clinical Social Work   Post Acute Care Choice: Skilled Nursing Facility                               Social Drivers of Health (SDOH) Interventions SDOH Screenings   Food Insecurity: Patient Unable To Answer (02/16/2023)  Housing: Patient Unable To Answer (02/16/2023)  Transportation Needs: Patient Unable To Answer (02/16/2023)  Utilities: Not At Risk (02/05/2023)  Depression (PHQ2-9): Low Risk  (08/28/2019)  Social Connections: Patient Unable To Answer (02/16/2023)  Recent Concern: Social Connections -  Socially Isolated (02/05/2023)  Tobacco Use: Low Risk  (02/15/2023)     Readmission Risk Interventions     No data to display

## 2023-02-23 NOTE — Plan of Care (Signed)
Problem: Education: Goal: Ability to describe self-care measures that may prevent or decrease complications (Diabetes Survival Skills Education) will improve 02/23/2023 1649 by Genevie Ann, RN Outcome: Adequate for Discharge 02/23/2023 1140 by Genevie Ann, RN Outcome: Progressing Goal: Individualized Educational Video(s) Outcome: Adequate for Discharge   Problem: Coping: Goal: Ability to adjust to condition or change in health will improve Outcome: Adequate for Discharge   Problem: Fluid Volume: Goal: Ability to maintain a balanced intake and output will improve Outcome: Adequate for Discharge   Problem: Health Behavior/Discharge Planning: Goal: Ability to identify and utilize available resources and services will improve Outcome: Adequate for Discharge Goal: Ability to manage health-related needs will improve Outcome: Adequate for Discharge   Problem: Metabolic: Goal: Ability to maintain appropriate glucose levels will improve Outcome: Adequate for Discharge   Problem: Nutritional: Goal: Maintenance of adequate nutrition will improve Outcome: Adequate for Discharge Goal: Progress toward achieving an optimal weight will improve Outcome: Adequate for Discharge   Problem: Skin Integrity: Goal: Risk for impaired skin integrity will decrease Outcome: Adequate for Discharge   Problem: Tissue Perfusion: Goal: Adequacy of tissue perfusion will improve Outcome: Adequate for Discharge   Problem: Fluid Volume: Goal: Hemodynamic stability will improve Outcome: Adequate for Discharge   Problem: Clinical Measurements: Goal: Diagnostic test results will improve Outcome: Adequate for Discharge Goal: Signs and symptoms of infection will decrease Outcome: Adequate for Discharge   Problem: Respiratory: Goal: Ability to maintain adequate ventilation will improve Outcome: Adequate for Discharge   Problem: Education: Goal: Knowledge of General Education information will  improve Description: Including pain rating scale, medication(s)/side effects and non-pharmacologic comfort measures Outcome: Adequate for Discharge   Problem: Health Behavior/Discharge Planning: Goal: Ability to manage health-related needs will improve Outcome: Adequate for Discharge   Problem: Clinical Measurements: Goal: Ability to maintain clinical measurements within normal limits will improve Outcome: Adequate for Discharge Goal: Will remain free from infection Outcome: Adequate for Discharge Goal: Diagnostic test results will improve Outcome: Adequate for Discharge Goal: Respiratory complications will improve Outcome: Adequate for Discharge Goal: Cardiovascular complication will be avoided Outcome: Adequate for Discharge   Problem: Clinical Measurements: Goal: Ability to maintain clinical measurements within normal limits will improve Outcome: Adequate for Discharge Goal: Will remain free from infection Outcome: Adequate for Discharge Goal: Diagnostic test results will improve Outcome: Adequate for Discharge Goal: Respiratory complications will improve Outcome: Adequate for Discharge Goal: Cardiovascular complication will be avoided Outcome: Adequate for Discharge   Problem: Activity: Goal: Risk for activity intolerance will decrease Outcome: Adequate for Discharge   Problem: Nutrition: Goal: Adequate nutrition will be maintained Outcome: Adequate for Discharge   Problem: Coping: Goal: Level of anxiety will decrease Outcome: Adequate for Discharge   Problem: Elimination: Goal: Will not experience complications related to bowel motility Outcome: Adequate for Discharge Goal: Will not experience complications related to urinary retention Outcome: Adequate for Discharge   Problem: Pain Managment: Goal: General experience of comfort will improve and/or be controlled Outcome: Adequate for Discharge   Problem: Safety: Goal: Ability to remain free from injury will  improve Outcome: Adequate for Discharge   Problem: Skin Integrity: Goal: Risk for impaired skin integrity will decrease Outcome: Adequate for Discharge   Problem: Acute Rehab PT Goals(only PT should resolve) Goal: Pt Will Go Supine/Side To Sit Outcome: Adequate for Discharge Goal: Pt Will Go Sit To Supine/Side Outcome: Adequate for Discharge Goal: Patient Will Transfer Sit To/From Stand Outcome: Adequate for Discharge Goal: Pt Will Ambulate Outcome: Adequate for Discharge

## 2023-02-23 NOTE — Progress Notes (Signed)
Patient d/c back to [facility], attempted to call report x2 left message to back. [facility] transporting patient. Family aware of discharge plan.  English [place], Kae Heller, RN

## 2023-02-23 NOTE — Plan of Care (Signed)
Problem: Education: Goal: Ability to describe self-care measures that may prevent or decrease complications (Diabetes Survival Skills Education) will improve Outcome: Progressing

## 2023-02-23 NOTE — Discharge Summary (Signed)
Physician Discharge Summary  Katherine [name] IHK:[id number] DOB: 11-09-1945 DOA: 02/14/2023  PCP: Coralee Rud, PA-C  Admit date: 02/14/2023 Discharge date: 02/23/2023  Admitted From: [facility] SNF) Disposition:  [facility])  Recommendations for Outpatient Follow-up:  Follow up with PCP in 1-2 weeks Please obtain BMP/CBC in one week Recheck TSH and free T4 in 2 to 4 weeks, please see discussion below   Diet recommendation: Heart Healthy /carb modified  Brief/Interim Summary:  78 year old female with history of coronary disease status post PCI, diabetes type 2, TAVR, dementia with behavioral disturbances, GERD who presented from skilled nursing facility with complaint of shortness of breath, fever, cough, confusion increased from baseline. On presentation, she was hypoxic requiring 2 L of oxygen, febrile, hypotensive. She was diagnosed with sepsis due to UTI and pneumonia and admitted to the hospital.   Sepsis secondary to ESBL UTI and pneumonia. . -Sepsis, present on admission, resolved at time of discharge -Being treated with empiric IV antibiotics, IV fluids, sepsis pathophysiology has solved, she was treated for pneumonia, respiratory status at baseline, she is currently on room air - blood cultures 1 out of 2 appears to be contaminant. -  Urine E. coli appears to be pan resistant, switched to carbapenem on 02/19/2023, finish total of 5 days of carbapenem, no further need for antibiotics on discharge.      Acute hypoxic respiratory  failure:  -Secondary to pneumonia/recent COVID.  On 2 L.  Continue to wean.  Clinically much improved.  She is currently on room air.   Hypokalemia, hypomagnesemia, hypophosphatemia: Replaced.   History of coronary artery disease: Status post PCI.  Metoprolol on hold due to hypotension initially, blood pressure has improved, back on Crestor, Eliquis and beta-blockers   Acute metabolic encephalopathy:  Secondary to sepsis/pneumonia.  CT head  did not show any acute findings. . As well she had recent MRI with no acute findings as well. -Patient much improved, he is with significant underlying dementia.    History of dementia: On Risperdal, Cogentin, Namenda.  Frequent reorientation, delirium precautions   Hypertension:  Blood pressures acceptable, continue with metoprolol (dose has been changed from XL heart acting and has been increased) -Continue to hold hydrochlorothiazide on discharge    GERD: Continue PPI   Hypokalemia -Repleted   Paroxysmal A-fib: Takes metoprolol, Eliquis.    Hypohyroidism.   -With diagnosis of hypothyroidism, but she is not on any Synthroid , TSH was checked during hospital stay which was on the lower side multiple times(was within normal limit earlier this month), her free T4 was elevated as well, would recommend to repeat level in 2 to 4 weeks when she is medically stable, repeat both TSH and free T4 , to see if she is having some underlying hyperthyroidism.   Diabetes type 2: Takes metformin.  Zoomed on discharge  Hypernatremia -Resolved, hold hydrochlorothiazide, encourage fluid intake  Pressure ulcer  - continue with wound care   Pressure Injury 02/15/23 Buttocks Medial;Right Deep Tissue Pressure Injury - Purple or maroon localized area of discolored intact skin or blood-filled blister due to damage of underlying soft tissue from pressure and/or shear. evolving dtpi 3 cm x 3 cm w (Active)  02/15/23   Location: Buttocks  Location Orientation: Medial;Right  Staging: Deep Tissue Pressure Injury - Purple or maroon localized area of discolored intact skin or blood-filled blister due to damage of underlying soft tissue from pressure and/or shear.  Wound Description (Comments): evolving dtpi 3 cm x 3 cm with partial thickness  skin loss  Present on Admission: Yes       Discharge Diagnoses:  Principal Problem:   Sepsis (HCC) Active Problems:   Chronic diastolic CHF (congestive heart failure)  (HCC)   Essential hypertension   Type 2 diabetes mellitus with complication (HCC)   S/P TAVR (transcatheter aortic valve replacement)   Coronary artery disease involving native coronary artery of native heart without angina pectoris   History of dementia   CAP (community acquired pneumonia)   Acute respiratory failure with hypoxia (HCC)   Acute metabolic encephalopathy    Discharge Instructions  Discharge Instructions     Diet - low sodium heart healthy   Complete by: As directed    Discharge instructions   Complete by: As directed    Follow with Primary MD Coralee Rud, PA-C  Get CBC, CMP,  checked  by Primary MD next visit.    Activity: As tolerated with Full fall precautions use walker/cane & assistance as needed   Disposition SNF   Diet: Heart Healthy    On your next visit with your primary care physician please Get Medicines reviewed and adjusted.   Please request your Prim.MD to go over all Hospital Tests and Procedure/Radiological results at the follow up, please get all Hospital records sent to your Prim MD by signing hospital release before you go home.   If you experience worsening of your admission symptoms, develop shortness of breath, life threatening emergency, suicidal or homicidal thoughts you must seek medical attention immediately by calling 911 or calling your MD immediately  if symptoms less severe.  You Must read complete instructions/literature along with all the possible adverse reactions/side effects for all the Medicines you take and that have been prescribed to you. Take any new Medicines after you have completely understood and accpet all the possible adverse reactions/side effects.   Do not drive, operating heavy machinery, perform activities at heights, swimming or participation in water activities or provide baby sitting services if your were admitted for syncope or siezures until you have seen by Primary MD or a Neurologist and advised to  do so again.  Do not drive when taking Pain medications.    Do not take more than prescribed Pain, Sleep and Anxiety Medications  Special Instructions: If you have smoked or chewed Tobacco  in the last 2 yrs please stop smoking, stop any regular Alcohol  and or any Recreational drug use.  Wear Seat belts while driving.   Please note  You were cared for by a hospitalist during your hospital stay. If you have any questions about your discharge medications or the care you received while you were in the hospital after you are discharged, you can call the unit and asked to speak with the hospitalist on call if the hospitalist that took care of you is not available. Once you are discharged, your primary care physician will handle any further medical issues. Please note that NO REFILLS for any discharge medications will be authorized once you are discharged, as it is imperative that you return to your primary care physician (or establish a relationship with a primary care physician if you do not have one) for your aftercare needs so that they can reassess your need for medications and monitor your lab values.   Discharge wound care:   Complete by: As directed    Pressure Injury 02/15/23 Buttocks Medial;Right Deep Tissue Pressure Injury - Purple or maroon localized area of discolored intact skin or blood-filled blister due to  damage of underlying soft tissue from pressure and/or shear. 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 PATIENT HAS HAD A PCN REACTION WITH IMMEDIATE RASH, FACIAL/TONGUE/THROAT SWELLING, SOB, OR LIGHTHEADEDNESS WITH HYPOTENSION:  #  #  #  YES  #  #  #   Has patient had a PCN reaction causing severe rash involving mucus membranes or skin necrosis: Unknown Has patient had a PCN reaction that required  hospitalization: Unknown Has patient had a PCN reaction occurring within the last 10 years: Unknown   Euphorbia Other (See Comments)   Not listed on MAR   Coconut (cocos Nucifera) Itching   Latex Rash   Mellaril [thioridazine] Itching        Medication List     STOP taking these medications    acetaminophen-codeine 300-30 MG tablet Commonly known as: TYLENOL #3   hydrochlorothiazide 12.5 MG capsule Commonly known as: MICROZIDE   metoprolol succinate 25 MG 24 hr tablet Commonly known as: Toprol XL       TAKE these medications    acetaminophen 500 MG tablet Commonly known as: TYLENOL Take 500 mg by mouth every 6 (six) hours as needed for mild pain or headache.   alendronate 70 MG tablet Commonly known as: FOSAMAX Take 1 tablet (70 mg total) by mouth every Monday.   apixaban 5 MG Tabs tablet Commonly known as: ELIQUIS Take 5 mg by mouth 2 (two) times daily.   ascorbic acid 500 MG tablet Commonly known as: VITAMIN C Take 500 mg by mouth daily.   aspirin EC 81 MG tablet Take 1 tablet (81 mg total) by mouth every other day.   benztropine 0.5 MG tablet Commonly known as: COGENTIN Take 1 tablet (0.5 mg total) by mouth in the morning and at bedtime.   bisacodyl 10 MG suppository Commonly known as: DULCOLAX Place 10 mg rectally daily as needed for moderate constipation.   calcium-vitamin D 500-200 MG-UNIT tablet Commonly known as: OSCAL WITH D Take 1 tablet by mouth 3 (three) times daily.   fluticasone 50 MCG/ACT nasal spray Commonly known as: FLONASE Place 2 sprays into both nostrils daily.   gabapentin 100 MG capsule Commonly known as: NEURONTIN Take 2 capsules (200 mg total) by mouth 2 (two) times daily. Take 200 mg by mouth in the morning and 100 mg at bedtime   ipratropium 0.06 % nasal spray Commonly known as: ATROVENT USE 1 SPRAY IN EACH NOSTRIL THREE TIMES DAILY   magnesium oxide 400 (240 Mg) MG tablet Commonly known as: MAG-OX Take 400 mg by  mouth daily.   metFORMIN 500 MG tablet Commonly known as: GLUCOPHAGE Take 1 tablet (500 mg total) by mouth in the morning and at bedtime.   metoprolol tartrate 50 MG tablet Commonly known as: LOPRESSOR Take 1 tablet (50 mg total) by mouth 2 (two) times daily.   multivitamin with minerals Tabs tablet Take 1 tablet by mouth daily with breakfast.   Namzaric 28-10 MG Cp24 Generic drug: Memantine HCl-Donepezil HCl Take 1 capsule by mouth in the morning.   nitroGLYCERIN 0.4 MG SL tablet Commonly known as: NITROSTAT DISSOLVE 1 TABLET UNDER THE TONGUE EVERY 5 MINUTES AS NEEDED FOR CHEST PAIN AS DIRECTED   NON FORMULARY Diet:Heart Healthy/CCD/thin liquids   Nutritional Drink  Liqd Take 1 Container by mouth daily. MedPass   Omega-3 1000 MG Caps Take 1,000 mg by mouth every Monday, Wednesday, and Friday.   pantoprazole 40 MG tablet Commonly known as: PROTONIX Take 1 tablet (40 mg total) by mouth daily.   Potassium Chloride ER 20 MEQ Tbcr Take 20 mEq by mouth in the morning and at bedtime.   risperiDONE 2 MG tablet Commonly known as: RISPERDAL Take 2 mg by mouth at bedtime.   rosuvastatin 40 MG tablet Commonly known as: CRESTOR Take 1 tablet orally daily at 6 PM.   Zinc Oxide 10 % Aero Apply 1 application  topically 2 (two) times daily. Incontinent care.               Discharge Care Instructions  (From admission, onward)           Start     Ordered   02/23/23 0000  Discharge wound care:       Comments: Pressure Injury 02/15/23 Buttocks Medial;Right Deep Tissue Pressure Injury - Purple or maroon localized area of discolored intact skin or blood-filled blister due to damage of underlying soft tissue from pressure and/or shear. 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 PATIENT HAS HAD A PCN REACTION WITH IMMEDIATE RASH,  FACIAL/TONGUE/THROAT SWELLING, SOB, OR LIGHTHEADEDNESS WITH HYPOTENSION:  #  #  #  YES  #  #  #   Has patient had a PCN reaction causing severe rash involving mucus membranes or skin necrosis: Unknown Has patient had a PCN reaction that required hospitalization: Unknown Has patient had a PCN reaction occurring within the last 10 years: Unknown    Euphorbia Other (See Comments)    Not listed on MAR   Coconut (Cocos Nucifera) Itching   Latex Rash   Mellaril [Thioridazine] Itching    Consultations: none   Procedures/Studies: DG Abd Portable 1V Result Date: 02/18/2023 CLINICAL DATA:  78 year old female with history of constipation. EXAM: PORTABLE ABDOMEN - 1 VIEW COMPARISON:  No priors. FINDINGS: Gas and stool are seen scattered throughout the colon extending to the level of the distal rectum. No pathologic distension of small bowel is noted. No gross evidence of pneumoperitoneum. Markers from surgical mash project over the right side of the abdomen, presumably from remote hernia repair. Multiple old healed fractures of the superior and inferior pubic rami bilaterally. Vascular calcifications are noted. IMPRESSION: 1. Nonobstructive bowel gas pattern. 2. Aortic atherosclerosis. Electronically Signed   By: Trudie Reed M.D.   On: 02/18/2023 07:42   DG Chest Port 1 View Result Date: 02/18/2023 CLINICAL DATA:  78 year old female with history of shortness of breath. EXAM: PORTABLE CHEST 1 VIEW COMPARISON:  Chest x-ray 02/16/2023. FINDINGS: Lung volumes are very low. No definite consolidative airspace disease. No definite pleural effusions. No pneumothorax. No evidence of pulmonary edema. Heart size appears borderline enlarged. The patient is rotated to the left on today's exam, resulting in distortion of the mediastinal contours and reduced diagnostic sensitivity and specificity for mediastinal pathology. Atherosclerotic calcifications are noted in the thoracic aorta. Status post TAVR. IMPRESSION:  1. Low lung volumes without radiographic evidence of acute cardiopulmonary disease. 2. Aortic atherosclerosis. Electronically Signed   By: Trudie Reed M.D.   On: 02/18/2023 07:40   DG Chest Port 1 View Result Date: 02/16/2023 CLINICAL DATA:  Short of breath. EXAM: PORTABLE CHEST 1 VIEW COMPARISON:  02/14/2023 and older exams.  CT, 04/03/2019. FINDINGS: Cardiac silhouette mostly obscured by the left hemidiaphragm. Stable changes from previous aortic valve replacement. No mediastinal or hilar masses. Lung volumes are low. There is mild opacity at the left lung base consistent with atelectasis. No convincing pneumonia or overt pulmonary edema. No pneumothorax. IMPRESSION: 1. Mild facet the left lung base, improved from the most recent prior exam, consistent with atelectasis. No convincing acute cardiopulmonary disease. Electronically Signed   By: Amie Portland M.D.   On: 02/16/2023 07:53   CT HEAD WO CONTRAST ( ) Result Date: 02/15/2023 CLINICAL DATA:  Hypoxia and lethargy in the setting of COVID. Mental status change with unknown cause. EXAM: CT HEAD WITHOUT CONTRAST TECHNIQUE: Contiguous axial images were obtained from the base of the skull through the vertex without intravenous contrast. RADIATION DOSE REDUCTION: This exam was performed according to the departmental dose-optimization program which includes automated exposure control, adjustment of the mA and/or kV according to patient size and/or use of iterative reconstruction technique. COMPARISON:  Brain MRI 02/05/2023 FINDINGS: Brain: No evidence of acute infarction, hemorrhage, hydrocephalus, extra-axial collection or mass lesion/mass effect. Bands of streak artifact best seen on reformats. Mild chronic small vessel disease in the cerebral white matter for age. Vascular: No hyperdense vessel or unexpected calcification. Skull: Normal. Negative for fracture or focal lesion. Sinuses/Orbits: No acute finding. IMPRESSION: Aging brain without acute or  interval finding. Electronically Signed   By: Tiburcio Pea M.D.   On: 02/15/2023 05:31   DG Chest Port 1 View Result Date: 02/14/2023 CLINICAL DATA:  Questionable sepsis - evaluate for abnormality EXAM: PORTABLE CHEST 1 VIEW COMPARISON:  02/04/2023 FINDINGS: Low lung volumes persist. Stable heart size and mediastinal contours. TAVR streaky opacity in the left greater than right lung base of increased from prior exam. No pulmonary edema. No pneumothorax or large pleural effusion. First right shoulder arthroplasty. Remote left rib fractures. IMPRESSION: Low lung volumes with streaky opacity in the left greater than right lung base, increased from prior exam, may represent atelectasis or pneumonia. Electronically Signed   By: Narda Rutherford M.D.   On: 02/14/2023 21:52   ECHOCARDIOGRAM LIMITED Result Date: 02/05/2023    ECHOCARDIOGRAM LIMITED REPORT   Patient Name:   Alaila Pillard [facility] Date of Exam: 02/05/2023 Medical Rec #:  [id number]         Height:       59.0 in Accession #:    [id number]        Weight:       121.3 lb Date of Birth:  [birth year]          BSA:          1.491 m Patient Age:    77 years          BP:           135/95 mmHg Patient Gender: F                 HR:           82 bpm. Exam Location:  Inpatient Procedure: 2D Echo, Cardiac Doppler and Color Doppler Indications:    chest pain  History:        Patient has no prior history of Echocardiogram examinations,                 most recent 01/18/2023. Risk Factors:Hypertension.  Aortic Valve: 26 mm Ultra, stented (TAVR) valve is present in                 the aortic position. Procedure Date: 03/2019.  Sonographer:    Melissa Morford RDCS (AE, PE) Referring Phys: SUBRINA SUNDIL IMPRESSIONS  1. Left ventricular ejection fraction, by estimation, is 60 to 65%. The left ventricle has normal function. The left ventricle has no regional wall motion abnormalities.  2. Right ventricular systolic function is hyperdynamic. The right ventricular size  is mildly enlarged.  3. Aortic valve gradients were not interrogated. No perivalvular leak.. The aortic valve has been repaired/replaced. There is a 26 mm Ultra, stented (TAVR) valve present in the aortic position. Procedure Date: 03/2019. Comparison(s): No significant change from prior study. 01/18/2023: LVEF 60-65%. FINDINGS  Left Ventricle: Left ventricular ejection fraction, by estimation, is 60 to 65%. The left ventricle has normal function. The left ventricle has no regional wall motion abnormalities. The left ventricular internal cavity size was normal in size. There is  no left ventricular hypertrophy. Right Ventricle: The right ventricular size is mildly enlarged. No increase in right ventricular wall thickness. Right ventricular systolic function is hyperdynamic. Aortic Valve: Aortic valve gradients were not interrogated. No perivalvular leak. The aortic valve has been repaired/replaced. There is a 26 mm Ultra, stented (TAVR) valve present in the aortic position. Procedure Date: 03/2019. LEFT VENTRICLE PLAX 2D LVIDd:         4.10 cm LVIDs:         3.10 cm LV PW:         0.90 cm LV IVS:        0.90 cm  LEFT ATRIUM         Index LA diam:    4.30 cm 2.88 cm/m   AORTA Ao Root diam: 2.70 cm Zoila Shutter MD Electronically signed by Zoila Shutter MD Signature Date/Time: 02/05/2023/2:49:50 PM    Final    EEG adult Result Date: 02/05/2023 Charlsie Quest, MD     02/05/2023 10:25 AM Patient Name: AMBROSIA WISNEWSKI MRN: [id number] Epilepsy Attending: Charlsie Quest Referring Physician/Provider: Alberteen Sam, MD Date: 02/05/2023 Duration: 22.57 mins Patient history: 78yo F with ams getting eeg to evaluate for seizure Level of alertness: Awake AEDs during EEG study: None Technical aspects: This EEG study was done with scalp electrodes positioned according to the 10-20 International system of electrode placement. Electrical activity was reviewed with band pass filter of 1-70Hz , sensitivity of 7 uV/mm, display  speed of 75mm/sec with a 60Hz  notched filter applied as appropriate. EEG data were recorded continuously and digitally stored.  Video monitoring was available and reviewed as appropriate. Description: The posterior dominant rhythm consists of 8 Hz activity of moderate voltage (25-35 uV) seen predominantly in posterior head regions, symmetric and reactive to eye opening and eye closing. EEG showed continuous generalized 3 to 6 Hz theta-delta slowing. Hyperventilation and photic stimulation were not performed.   ABNORMALITY - Continuous slow, generalized IMPRESSION: This study is suggestive of moderate diffuse encephalopathy. No seizures or epileptiform discharges were seen throughout the recording. Charlsie Quest   MR BRAIN WO CONTRAST Result Date: 02/05/2023 CLINICAL DATA:  Altered mental status. EXAM: MRI HEAD WITHOUT CONTRAST TECHNIQUE: Multiplanar, multiecho pulse sequences of the brain and surrounding structures were obtained without intravenous contrast. COMPARISON:  Head CT 02/04/2023 and MRI 04/30/2013 FINDINGS: The study is mildly motion degraded. Brain: There is no evidence of an acute infarct, mass, midline shift, or extra-axial  fluid collection. T2 hyperintensities in the cerebral white matter bilaterally have progressed from the prior MRI and are nonspecific but compatible with mild chronic small vessel ischemic disease. Chronic lacunar infarcts in the left lentiform nucleus and right cerebellar hemisphere are new from the prior MRI. There is mild generalized cerebral atrophy. Scattered chronic cerebral microhemorrhages are again seen bilaterally. Vascular: Major intracranial vascular flow voids are preserved. Skull and upper cervical spine: Unremarkable bone marrow signal. Sinuses/Orbits: Bilateral cataract extraction. Mild mucosal thickening in the paranasal sinuses. No significant mastoid fluid. Other: None. IMPRESSION: 1. No acute intracranial abnormality. 2. Mild chronic small vessel ischemic  disease. Electronically Signed   By: Sebastian Ache M.D.   On: 02/05/2023 07:37   CT HEAD WO CONTRAST Result Date: 02/04/2023 CLINICAL DATA:  Altered mental status.  Found unresponsive. EXAM: CT HEAD WITHOUT CONTRAST TECHNIQUE: Contiguous axial images were obtained from the base of the skull through the vertex without intravenous contrast. RADIATION DOSE REDUCTION: This exam was performed according to the departmental dose-optimization program which includes automated exposure control, adjustment of the mA and/or kV according to patient size and/or use of iterative reconstruction technique. COMPARISON:  CT head 09/29/2022. FINDINGS: Brain: No evidence of acute large vascular territory infarction, hemorrhage, hydrocephalus, extra-axial collection or mass lesion/mass effect. Vascular: No hyperdense vessel. Skull: No acute fracture. Sinuses/Orbits: Clear sinuses. IMPRESSION: No evidence of acute intracranial abnormality. Electronically Signed   By: Feliberto Harts M.D.   On: 02/04/2023 22:27   CT ABDOMEN PELVIS W CONTRAST Result Date: 02/04/2023 CLINICAL DATA:  Found unresponsive EXAM: CT ABDOMEN AND PELVIS WITH CONTRAST TECHNIQUE: Multidetector CT imaging of the abdomen and pelvis was performed using the standard protocol following bolus administration of intravenous contrast. RADIATION DOSE REDUCTION: This exam was performed according to the departmental dose-optimization program which includes automated exposure control, adjustment of the mA and/or kV according to patient size and/or use of iterative reconstruction technique. CONTRAST:  75mL OMNIPAQUE IOHEXOL 350 MG/ML SOLN COMPARISON:  04/03/19 FINDINGS: Lower chest: No acute abnormality.  Changes of prior TAVR are noted. Hepatobiliary: No focal liver abnormality is seen. No gallstones, gallbladder wall thickening, or biliary dilatation. Pancreas: Unremarkable. No pancreatic ductal dilatation or surrounding inflammatory changes. Spleen: Normal in size without  focal abnormality. Adrenals/Urinary Tract: Adrenal glands are within normal limits. Kidneys demonstrate a normal enhancement pattern bilaterally. No renal calculi or obstructive changes are noted. The bladder is well distended. Stomach/Bowel: Diverticular change of the colon is noted. No obstructive or inflammatory changes of colon are seen. The appendix is within normal limits. Small bowel and stomach are unremarkable. Vascular/Lymphatic: Aortic atherosclerosis. No enlarged abdominal or pelvic lymph nodes. Reproductive: Status post hysterectomy. No adnexal masses. Other: No abdominal wall hernia or abnormality. No abdominopelvic ascites. Musculoskeletal: Multiple old healed pelvic fractures are seen in the pubic rami bilaterally. Right sacral healed fracture is noted as well. 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 IMPRESSION: Diverticulosis without diverticulitis. No acute abnormality noted. Electronically Signed   By: Alcide Clever M.D.   On: 02/04/2023 22:21   DG Chest Port 1 View Result Date: 02/04/2023 CLINICAL DATA:  Low saturations and altered mental status EXAM: PORTABLE CHEST 1 VIEW COMPARISON:  09/29/2022 FINDINGS: Low lung volumes accentuate cardiomediastinal silhouette and pulmonary vascularity. TAVR. Coronary stenting. Cardiomegaly. Pulmonary vascular congestion. Similar scarring in the left mid lung. No focal consolidation, pleural effusion, or pneumothorax. Right reverse TSA. IMPRESSION: Cardiomegaly with pulmonary vascular congestion. Electronically Signed   By: Angelique Holm.D.  On: 02/04/2023 20:49      Subjective:  Significant events overnight as discussed with staff, patient denies any complaints Discharge Exam: Vitals:   02/23/23 0750 02/23/23 0751  BP:  (!) 123/96  Pulse: 78 68  Resp: (!) 26 (!) 23  Temp:  98.2 F (36.8 C)  SpO2: 99% 100%   Vitals:   02/23/23 0000 02/23/23 0400 02/23/23 0750 02/23/23 0751  BP:    (!) 123/96  Pulse:   78 68   Resp:   (!) 26 (!) 23  Temp: 98.4 F (36.9 C) 98.1 F (36.7 C)  98.2 F (36.8 C)  TempSrc: Oral Axillary  Oral  SpO2:   99% 100%  Weight:      Height:        General: Pt is alert, pleasant, frail, demented, in no apparent distress Cardiovascular: RRR, S1/S2 +, no rubs, no gallops Respiratory: CTA bilaterally, no wheezing, no rhonchi Abdominal: Soft, NT, ND, bowel sounds + Extremities: no edema, no cyanosis    The results of significant diagnostics from this hospitalization (including imaging, microbiology, ancillary and laboratory) are listed below for reference.     Microbiology: Recent Results (from the past 240 hours)  Blood Culture (routine x 2)     Status: Abnormal   Collection Time: 02/14/23  9:33 PM   Specimen: BLOOD  Result Value Ref Range Status   Specimen Description BLOOD LEFT ANTECUBITAL  Final   Special Requests   Final    BOTTLES DRAWN AEROBIC AND ANAEROBIC Blood Culture results may not be optimal due to an inadequate volume of blood received in culture bottles   Culture  Setup Time   Final    GRAM POSITIVE COCCI IN CLUSTERS AEROBIC BOTTLE ONLY CRITICAL RESULT CALLED TO, READ BACK BY AND VERIFIED WITH: PHARMD CAREN AMEND ON 02/15/23 @ 1652 BY DRT    Culture (A)  Final    STAPHYLOCOCCUS HOMINIS UNABLE TO ISOLATE STAPHYLOCOCCUS EPIDERMIDIS WHICH WAS IDENTIFIED AS PRESENT BY BCID. THE SIGNIFICANCE OF ISOLATING THIS ORGANISM FROM A SINGLE SET OF BLOOD CULTURES WHEN MULTIPLE SETS ARE DRAWN IS UNCERTAIN. PLEASE NOTIFY THE MICROBIOLOGY DEPARTMENT WITHIN ONE WEEK IF SPECIATION AND SENSITIVITIES ARE REQUIRED. Performed at [facility], [address] [place], [place] [address]    Report Status 02/17/2023 FINAL  Final  Blood Culture ID Panel (Reflexed)     Status: Abnormal   Collection Time: 02/14/23  9:33 PM  Result Value Ref Range Status   Enterococcus faecalis NOT DETECTED NOT DETECTED Final   Enterococcus Faecium NOT DETECTED NOT DETECTED Final    Listeria monocytogenes NOT DETECTED NOT DETECTED Final   Staphylococcus species DETECTED (A) NOT DETECTED Final    Comment: CRITICAL RESULT CALLED TO, READ BACK BY AND VERIFIED WITH: PHARMD CAREN AMEND ON 02/15/23 @ 1652 BY DRT    Staphylococcus aureus (BCID) NOT DETECTED NOT DETECTED Final   Staphylococcus epidermidis DETECTED (A) NOT DETECTED Final    Comment: Methicillin (oxacillin) resistant coagulase negative staphylococcus. Possible blood culture contaminant (unless isolated from more than one blood culture draw or clinical case suggests pathogenicity). No antibiotic treatment is indicated for blood  culture contaminants. CRITICAL RESULT CALLED TO, READ BACK BY AND VERIFIED WITH: PHARMD CAREN AMEND ON 02/15/23 @ 1652 BY DRT    Staphylococcus lugdunensis NOT DETECTED NOT DETECTED Final   Streptococcus species NOT DETECTED NOT DETECTED Final   Streptococcus agalactiae NOT DETECTED NOT DETECTED Final   Streptococcus pneumoniae NOT DETECTED NOT DETECTED Final   Streptococcus pyogenes  NOT DETECTED NOT DETECTED Final   A.calcoaceticus-baumannii NOT DETECTED NOT DETECTED Final   Bacteroides fragilis NOT DETECTED NOT DETECTED Final   Enterobacterales NOT DETECTED NOT DETECTED Final   Enterobacter cloacae complex NOT DETECTED NOT DETECTED Final   Escherichia coli NOT DETECTED NOT DETECTED Final   Klebsiella aerogenes NOT DETECTED NOT DETECTED Final   Klebsiella oxytoca NOT DETECTED NOT DETECTED Final   Klebsiella pneumoniae NOT DETECTED NOT DETECTED Final   Proteus species NOT DETECTED NOT DETECTED Final   Salmonella species NOT DETECTED NOT DETECTED Final   Serratia marcescens NOT DETECTED NOT DETECTED Final   Haemophilus influenzae NOT DETECTED NOT DETECTED Final   Neisseria meningitidis NOT DETECTED NOT DETECTED Final   Pseudomonas aeruginosa NOT DETECTED NOT DETECTED Final   Stenotrophomonas maltophilia NOT DETECTED NOT DETECTED Final   Candida albicans NOT DETECTED NOT DETECTED Final    Candida auris NOT DETECTED NOT DETECTED Final   Candida glabrata NOT DETECTED NOT DETECTED Final   Candida krusei NOT DETECTED NOT DETECTED Final   Candida parapsilosis NOT DETECTED NOT DETECTED Final   Candida tropicalis NOT DETECTED NOT DETECTED Final   Cryptococcus neoformans/gattii NOT DETECTED NOT DETECTED Final   Methicillin resistance mecA/C DETECTED (A) NOT DETECTED Final    Comment: CRITICAL RESULT CALLED TO, READ BACK BY AND VERIFIED WITH: PHARMD CAREN AMEND ON 02/15/23 @ 1652 BY DRT Performed at [facility], [address] [place], [place] [address]   Resp panel by RT-PCR (RSV, Flu A&B, Covid) Anterior Nasal Swab     Status: None   Collection Time: 02/14/23 10:41 PM   Specimen: Anterior Nasal Swab  Result Value Ref Range Status   SARS Coronavirus 2 by RT PCR NEGATIVE NEGATIVE Final   Influenza A by PCR NEGATIVE NEGATIVE Final   Influenza B by PCR NEGATIVE NEGATIVE Final    Comment: (NOTE) The Xpert Xpress SARS-CoV-2/FLU/RSV plus assay is intended as an aid in the diagnosis of influenza from Nasopharyngeal swab specimens and should not be used as a sole basis for treatment. Nasal washings and aspirates are unacceptable for Xpert Xpress SARS-CoV-2/FLU/RSV testing.  Fact Sheet for Patients: BloggerCourse.com  Fact Sheet for Healthcare Providers: SeriousBroker.it  This test is not yet approved or cleared by the [place] FDA and has been authorized for detection and/or diagnosis of SARS-CoV-2 by FDA under an Emergency Use Authorization (EUA). This EUA will remain in effect (meaning this test can be used) for the duration of the COVID-19 declaration under Section 564(b)(1) of the Act, 21 U.S.C. section 360bbb-3(b)(1), unless the authorization is terminated or revoked.     Resp Syncytial Virus by PCR NEGATIVE NEGATIVE Final    Comment: (NOTE) Fact Sheet for  Patients: BloggerCourse.com  Fact Sheet for Healthcare Providers: SeriousBroker.it  This test is not yet approved or cleared by the [place] FDA and has been authorized for detection and/or diagnosis of SARS-CoV-2 by FDA under an Emergency Use Authorization (EUA). This EUA will remain in effect (meaning this test can be used) for the duration of the COVID-19 declaration under Section 564(b)(1) of the Act, 21 U.S.C. section 360bbb-3(b)(1), unless the authorization is terminated or revoked.  Performed at [facility], [address] [place], [place] [address]   Urine Culture     Status: Abnormal   Collection Time: 02/14/23 10:41 PM   Specimen: Urine, Catheterized  Result Value Ref Range Status   Specimen Description URINE, CATHETERIZED  Final   Special Requests   Final  NONE Reflexed from [id number] Performed at [facility] Lab, [address], [place], [place] [address]    Culture (A)  Final    >=100,000 COLONIES/mL ESCHERICHIA COLI Confirmed Extended Spectrum Beta-Lactamase Producer (ESBL).  In bloodstream infections from ESBL organisms, carbapenems are preferred over piperacillin/tazobactam. They are shown to have a lower risk of mortality.    Report Status 02/18/2023 FINAL  Final   Organism ID, Bacteria ESCHERICHIA COLI (A)  Final      Susceptibility   Escherichia coli - MIC*    AMPICILLIN >=32 RESISTANT Resistant     CEFAZOLIN >=64 RESISTANT Resistant     CEFEPIME >=32 RESISTANT Resistant     CEFTRIAXONE >=64 RESISTANT Resistant     CIPROFLOXACIN >=4 RESISTANT Resistant     GENTAMICIN <=1 SENSITIVE Sensitive     IMIPENEM <=0.25 SENSITIVE Sensitive     NITROFURANTOIN <=16 SENSITIVE Sensitive     TRIMETH/SULFA >=320 RESISTANT Resistant     AMPICILLIN/SULBACTAM >=32 RESISTANT Resistant     PIP/TAZO 16 SENSITIVE Sensitive ug/mL    * >=100,000 COLONIES/mL ESCHERICHIA COLI  Blood Culture (routine x 2)     Status:  None   Collection Time: 02/14/23 10:43 PM   Specimen: BLOOD  Result Value Ref Range Status   Specimen Description BLOOD SITE NOT SPECIFIED  Final   Special Requests   Final    BOTTLES DRAWN AEROBIC AND ANAEROBIC Blood Culture adequate volume   Culture   Final    NO GROWTH 5 DAYS Performed at [facility], [address] [place], [place] [address]    Report Status 02/19/2023 FINAL  Final  MRSA Next Gen by PCR, Nasal     Status: None   Collection Time: 02/15/23  4:26 PM   Specimen: Nasal Mucosa; Nasal Swab  Result Value Ref Range Status   MRSA by PCR Next Gen NOT DETECTED NOT DETECTED Final    Comment: (NOTE) The GeneXpert MRSA Assay (FDA approved for NASAL specimens only), is one component of a comprehensive MRSA colonization surveillance program. It is not intended to diagnose MRSA infection nor to guide or monitor treatment for MRSA infections. Test performance is not FDA approved in patients less than 75 years old. Performed at [facility], [address], [place], [place] [address]      Labs: BNP (last 3 results) Recent Labs    02/17/23 0632 02/18/23 0806 02/19/23 0445  BNP 1,178.7* 776.4* 362.3*   Basic Metabolic Panel: Recent Labs  Lab 02/17/23 9562 02/18/23 0806 02/19/23 0445 02/20/23 0449 02/21/23 0441 02/23/23 0534 02/23/23 1022  NA 144 141 140 140 144 147* 143  K 3.1* 2.5* 3.9 3.3* 3.2* 4.0  --   CL 112* 105 109 107 109 109  --   CO2 23 25 22 22 27 29   --   GLUCOSE 144* 115* 109* 116* 117* 124*  --   BUN 11 5* 7* 9 11 22   --   CREATININE 0.61 0.53 0.62 0.53 0.50 0.60  --   CALCIUM 7.5* 6.9* 7.2* 7.6* 8.3* 8.7*  --   MG 1.5* 1.8 1.5* 2.1 1.7  --   --   PHOS 2.4* 2.2* 2.9  --  2.9  --   --    Liver Function Tests: Recent Labs  Lab 02/17/23 0632 02/18/23 0806 02/19/23 0445  AST 20 22 29   ALT 17 20 23   ALKPHOS 52 47 43  BILITOT 0.6 0.5 0.4  PROT 5.7* 5.2* 5.2*  ALBUMIN 2.3*  2.1* 2.1*   No results for input(s): "LIPASE",  "AMYLASE" in the last 168 hours. Recent Labs  Lab 02/17/23 0632 02/18/23 0806 02/19/23 0445  AMMONIA 24 19 28    CBC: Recent Labs  Lab 02/17/23 0632 02/18/23 0806 02/19/23 0445 02/21/23 0441 02/23/23 0534  WBC 13.7* 14.3* 12.5* 10.3 11.1*  NEUTROABS 11.6* 11.3* 9.9* 8.0*  --   HGB 10.2* 10.0* 9.5* 9.9* 9.7*  HCT 31.5* 30.5* 29.6* 30.9* 30.6*  MCV 94.6 92.7 94.3 93.9 95.0  PLT 252 201 189 227 173   Cardiac Enzymes: No results for input(s): "CKTOTAL", "CKMB", "CKMBINDEX", "TROPONINI" in the last 168 hours. BNP: Invalid input(s): "POCBNP" CBG: Recent Labs  Lab 02/22/23 0821 02/22/23 1213 02/22/23 1622 02/22/23 2035 02/23/23 0749  GLUCAP 105* 165* 157* 136* 111*   D-Dimer No results for input(s): "DDIMER" in the last 72 hours. Hgb A1c No results for input(s): "HGBA1C" in the last 72 hours. Lipid Profile No results for input(s): "CHOL", "HDL", "LDLCALC", "TRIG", "CHOLHDL", "LDLDIRECT" in the last 72 hours. Thyroid function studies No results for input(s): "TSH", "T4TOTAL", "T3FREE", "THYROIDAB" in the last 72 hours.  Invalid input(s): "FREET3" Anemia work up No results for input(s): "VITAMINB12", "FOLATE", "FERRITIN", "TIBC", "IRON", "RETICCTPCT" in the last 72 hours. Urinalysis    Component Value Date/Time   COLORURINE AMBER (A) 02/14/2023 2241   APPEARANCEUR TURBID (A) 02/14/2023 2241   LABSPEC 1.018 02/14/2023 2241   PHURINE 5.0 02/14/2023 2241   GLUCOSEU NEGATIVE 02/14/2023 2241   GLUCOSEU NEGATIVE 06/15/2015 1349   HGBUR SMALL (A) 02/14/2023 2241   BILIRUBINUR NEGATIVE 02/14/2023 2241   KETONESUR 5 (A) 02/14/2023 2241   PROTEINUR 100 (A) 02/14/2023 2241   UROBILINOGEN 0.2 06/15/2015 1349   NITRITE NEGATIVE 02/14/2023 2241   LEUKOCYTESUR MODERATE (A) 02/14/2023 2241   Sepsis Labs Recent Labs  Lab 02/18/23 0806 02/19/23 0445 02/21/23 0441 02/23/23 0534  WBC 14.3* 12.5* 10.3 11.1*   Microbiology Recent Results (from the past 240 hours)  Blood  Culture (routine x 2)     Status: Abnormal   Collection Time: 02/14/23  9:33 PM   Specimen: BLOOD  Result Value Ref Range Status   Specimen Description BLOOD LEFT ANTECUBITAL  Final   Special Requests   Final    BOTTLES DRAWN AEROBIC AND ANAEROBIC Blood Culture results may not be optimal due to an inadequate volume of blood received in culture bottles   Culture  Setup Time   Final    GRAM POSITIVE COCCI IN CLUSTERS AEROBIC BOTTLE ONLY CRITICAL RESULT CALLED TO, READ BACK BY AND VERIFIED WITH: PHARMD CAREN AMEND ON 02/15/23 @ 1652 BY DRT    Culture (A)  Final    STAPHYLOCOCCUS HOMINIS UNABLE TO ISOLATE STAPHYLOCOCCUS EPIDERMIDIS WHICH WAS IDENTIFIED AS PRESENT BY BCID. THE SIGNIFICANCE OF ISOLATING THIS ORGANISM FROM A SINGLE SET OF BLOOD CULTURES WHEN MULTIPLE SETS ARE DRAWN IS UNCERTAIN. PLEASE NOTIFY THE MICROBIOLOGY DEPARTMENT WITHIN ONE WEEK IF SPECIATION AND SENSITIVITIES ARE REQUIRED. Performed at [facility], [address], [place], [place] [address]    Report Status 02/17/2023 FINAL  Final  Blood Culture ID Panel (Reflexed)     Status: Abnormal   Collection Time: 02/14/23  9:33 PM  Result Value Ref Range Status   Enterococcus faecalis NOT DETECTED NOT DETECTED Final   Enterococcus Faecium NOT DETECTED NOT DETECTED Final   Listeria monocytogenes NOT DETECTED NOT DETECTED Final   Staphylococcus species DETECTED (A) NOT DETECTED Final    Comment: CRITICAL RESULT CALLED TO,  READ BACK BY AND VERIFIED WITH: PHARMD CAREN AMEND ON 02/15/23 @ 1652 BY DRT    Staphylococcus aureus (BCID) NOT DETECTED NOT DETECTED Final   Staphylococcus epidermidis DETECTED (A) NOT DETECTED Final    Comment: Methicillin (oxacillin) resistant coagulase negative staphylococcus. Possible blood culture contaminant (unless isolated from more than one blood culture draw or clinical case suggests pathogenicity). No antibiotic treatment is indicated for blood  culture contaminants. CRITICAL RESULT CALLED  TO, READ BACK BY AND VERIFIED WITH: PHARMD CAREN AMEND ON 02/15/23 @ 1652 BY DRT    Staphylococcus lugdunensis NOT DETECTED NOT DETECTED Final   Streptococcus species NOT DETECTED NOT DETECTED Final   Streptococcus agalactiae NOT DETECTED NOT DETECTED Final   Streptococcus pneumoniae NOT DETECTED NOT DETECTED Final   Streptococcus pyogenes NOT DETECTED NOT DETECTED Final   A.calcoaceticus-baumannii NOT DETECTED NOT DETECTED Final   Bacteroides fragilis NOT DETECTED NOT DETECTED Final   Enterobacterales NOT DETECTED NOT DETECTED Final   Enterobacter cloacae complex NOT DETECTED NOT DETECTED Final   Escherichia coli NOT DETECTED NOT DETECTED Final   Klebsiella aerogenes NOT DETECTED NOT DETECTED Final   Klebsiella oxytoca NOT DETECTED NOT DETECTED Final   Klebsiella pneumoniae NOT DETECTED NOT DETECTED Final   Proteus species NOT DETECTED NOT DETECTED Final   Salmonella species NOT DETECTED NOT DETECTED Final   Serratia marcescens NOT DETECTED NOT DETECTED Final   Haemophilus influenzae NOT DETECTED NOT DETECTED Final   Neisseria meningitidis NOT DETECTED NOT DETECTED Final   Pseudomonas aeruginosa NOT DETECTED NOT DETECTED Final   Stenotrophomonas maltophilia NOT DETECTED NOT DETECTED Final   Candida albicans NOT DETECTED NOT DETECTED Final   Candida auris NOT DETECTED NOT DETECTED Final   Candida glabrata NOT DETECTED NOT DETECTED Final   Candida krusei NOT DETECTED NOT DETECTED Final   Candida parapsilosis NOT DETECTED NOT DETECTED Final   Candida tropicalis NOT DETECTED NOT DETECTED Final   Cryptococcus neoformans/gattii NOT DETECTED NOT DETECTED Final   Methicillin resistance mecA/C DETECTED (A) NOT DETECTED Final    Comment: CRITICAL RESULT CALLED TO, READ BACK BY AND VERIFIED WITH: PHARMD CAREN AMEND ON 02/15/23 @ 1652 BY DRT Performed at [facility], [address] [place], [place] [address]   Resp panel by RT-PCR (RSV, Flu A&B, Covid) Anterior Nasal Swab     Status:  None   Collection Time: 02/14/23 10:41 PM   Specimen: Anterior Nasal Swab  Result Value Ref Range Status   SARS Coronavirus 2 by RT PCR NEGATIVE NEGATIVE Final   Influenza A by PCR NEGATIVE NEGATIVE Final   Influenza B by PCR NEGATIVE NEGATIVE Final    Comment: (NOTE) The Xpert Xpress SARS-CoV-2/FLU/RSV plus assay is intended as an aid in the diagnosis of influenza from Nasopharyngeal swab specimens and should not be used as a sole basis for treatment. Nasal washings and aspirates are unacceptable for Xpert Xpress SARS-CoV-2/FLU/RSV testing.  Fact Sheet for Patients: BloggerCourse.com  Fact Sheet for Healthcare Providers: SeriousBroker.it  This test is not yet approved or cleared by the [place] FDA and has been authorized for detection and/or diagnosis of SARS-CoV-2 by FDA under an Emergency Use Authorization (EUA). This EUA will remain in effect (meaning this test can be used) for the duration of the COVID-19 declaration under Section 564(b)(1) of the Act, 21 U.S.C. section 360bbb-3(b)(1), unless the authorization is terminated or revoked.     Resp Syncytial Virus by PCR NEGATIVE NEGATIVE Final    Comment: (NOTE) Fact Sheet for Patients: BloggerCourse.com  Fact Sheet for Healthcare Providers: SeriousBroker.it  This test is not yet approved or cleared by the [place] FDA and has been authorized for detection and/or diagnosis of SARS-CoV-2 by FDA under an Emergency Use Authorization (EUA). This EUA will remain in effect (meaning this test can be used) for the duration of the COVID-19 declaration under Section 564(b)(1) of the Act, 21 U.S.C. section 360bbb-3(b)(1), unless the authorization is terminated or revoked.  Performed at [facility] Lab, [address] [place], [place] [address]   Urine Culture     Status: Abnormal   Collection Time: 02/14/23 10:41 PM    Specimen: Urine, Catheterized  Result Value Ref Range Status   Specimen Description URINE, CATHETERIZED  Final   Special Requests   Final    NONE Reflexed from [id number] Performed at [facility], [address], [place], [place] [address]    Culture (A)  Final    >=100,000 COLONIES/mL ESCHERICHIA COLI Confirmed Extended Spectrum Beta-Lactamase Producer (ESBL).  In bloodstream infections from ESBL organisms, carbapenems are preferred over piperacillin/tazobactam. They are shown to have a lower risk of mortality.    Report Status 02/18/2023 FINAL  Final   Organism ID, Bacteria ESCHERICHIA COLI (A)  Final      Susceptibility   Escherichia coli - MIC*    AMPICILLIN >=32 RESISTANT Resistant     CEFAZOLIN >=64 RESISTANT Resistant     CEFEPIME >=32 RESISTANT Resistant     CEFTRIAXONE >=64 RESISTANT Resistant     CIPROFLOXACIN >=4 RESISTANT Resistant     GENTAMICIN <=1 SENSITIVE Sensitive     IMIPENEM <=0.25 SENSITIVE Sensitive     NITROFURANTOIN <=16 SENSITIVE Sensitive     TRIMETH/SULFA >=320 RESISTANT Resistant     AMPICILLIN/SULBACTAM >=32 RESISTANT Resistant     PIP/TAZO 16 SENSITIVE Sensitive ug/mL    * >=100,000 COLONIES/mL ESCHERICHIA COLI  Blood Culture (routine x 2)     Status: None   Collection Time: 02/14/23 10:43 PM   Specimen: BLOOD  Result Value Ref Range Status   Specimen Description BLOOD SITE NOT SPECIFIED  Final   Special Requests   Final    BOTTLES DRAWN AEROBIC AND ANAEROBIC Blood Culture adequate volume   Culture   Final    NO GROWTH 5 DAYS Performed at [facility], [address] [place], [place] [address]    Report Status 02/19/2023 FINAL  Final  MRSA Next Gen by PCR, Nasal     Status: None   Collection Time: 02/15/23  4:26 PM   Specimen: Nasal Mucosa; Nasal Swab  Result Value Ref Range Status   MRSA by PCR Next Gen NOT DETECTED NOT DETECTED Final    Comment: (NOTE) The GeneXpert MRSA Assay (FDA approved for NASAL specimens only), is one  component of a comprehensive MRSA colonization surveillance program. It is not intended to diagnose MRSA infection nor to guide or monitor treatment for MRSA infections. Test performance is not FDA approved in patients less than 66 years old. Performed at [facility], [address], [place], [place] [address]      Time coordinating discharge: Over 30 minutes  SIGNED:   Huey Bienenstock, MD  [facility] 02/23/2023, 11:37 AM Pager   If 7PM-7AM, please contact night-coverage [email or web address] Password [email or web address]

## 2023-02-23 NOTE — TOC Progression Note (Signed)
Transition of Care [facility]) - Progression Note    Patient Details  Name: Katherine [name] MRN: [id number] Date of Birth: Sep 21, 1945  Transition of Care [facility] CM/SW Contact  Alcee Sipos Reeves Forth, Student-Social Work Phone Number: 02/23/2023, 2:01 PM  Clinical Narrative:    MSW Intern called pt sister to inform her that her sister would be returning to [facility] via non emergency ambulance- pt sister agreed.    Expected Discharge Plan: Skilled Nursing Facility Barriers to Discharge: Continued Medical Work up  Expected Discharge Plan and Services In-house Referral: Clinical Social Work   Post Acute Care Choice: Skilled Nursing Facility Living arrangements for the past 2 months: Skilled Nursing Facility Expected Discharge Date: 02/23/23                                     Social Determinants of Health (SDOH) Interventions SDOH Screenings   Food Insecurity: Patient Unable To Answer (02/16/2023)  Housing: Patient Unable To Answer (02/16/2023)  Transportation Needs: Patient Unable To Answer (02/16/2023)  Utilities: Not At Risk (02/05/2023)  Depression (PHQ2-9): Low Risk  (08/28/2019)  Social Connections: Patient Unable To Answer (02/16/2023)  Recent Concern: Social Connections - Socially Isolated (02/05/2023)  Tobacco Use: Low Risk  (02/15/2023)    Readmission Risk Interventions     No data to display

## 2023-02-23 NOTE — Discharge Instructions (Signed)
Follow with Primary MD Coralee Rud, PA-C  Get CBC, CMP,  checked  by Primary MD next visit.    Activity: As tolerated with Full fall precautions use walker/cane & assistance as needed   Disposition SNF   Diet: Heart Healthy    On your next visit with your primary care physician please Get Medicines reviewed and adjusted.   Please request your Prim.MD to go over all Hospital Tests and Procedure/Radiological results at the follow up, please get all Hospital records sent to your Prim MD by signing hospital release before you go home.   If you experience worsening of your admission symptoms, develop shortness of breath, life threatening emergency, suicidal or homicidal thoughts you must seek medical attention immediately by calling 911 or calling your MD immediately  if symptoms less severe.  You Must read complete instructions/literature along with all the possible adverse reactions/side effects for all the Medicines you take and that have been prescribed to you. Take any new Medicines after you have completely understood and accpet all the possible adverse reactions/side effects.   Do not drive, operating heavy machinery, perform activities at heights, swimming or participation in water activities or provide baby sitting services if your were admitted for syncope or siezures until you have seen by Primary MD or a Neurologist and advised to do so again.  Do not drive when taking Pain medications.    Do not take more than prescribed Pain, Sleep and Anxiety Medications  Special Instructions: If you have smoked or chewed Tobacco  in the last 2 yrs please stop smoking, stop any regular Alcohol  and or any Recreational drug use.  Wear Seat belts while driving.   Please note  You were cared for by a hospitalist during your hospital stay. If you have any questions about your discharge medications or the care you received while you were in the hospital after you are discharged, you  can call the unit and asked to speak with the hospitalist on call if the hospitalist that took care of you is not available. Once you are discharged, your primary care physician will handle any further medical issues. Please note that NO REFILLS for any discharge medications will be authorized once you are discharged, as it is imperative that you return to your primary care physician (or establish a relationship with a primary care physician if you do not have one) for your aftercare needs so that they can reassess your need for medications and monitor your lab values.

## 2023-02-27 ENCOUNTER — Ambulatory Visit: Payer: Medicare Other | Admitting: Cardiovascular Disease

## 2023-03-15 ENCOUNTER — Encounter: Payer: Self-pay | Admitting: Cardiovascular Disease

## 2023-03-15 ENCOUNTER — Ambulatory Visit: Payer: Medicare Other | Attending: Cardiovascular Disease | Admitting: Cardiovascular Disease

## 2023-03-15 VITALS — BP 120/70 | HR 65 | Ht 59.0 in

## 2023-03-15 DIAGNOSIS — Z952 Presence of prosthetic heart valve: Secondary | ICD-10-CM

## 2023-03-15 DIAGNOSIS — I480 Paroxysmal atrial fibrillation: Secondary | ICD-10-CM | POA: Diagnosis not present

## 2023-03-15 DIAGNOSIS — I2510 Atherosclerotic heart disease of native coronary artery without angina pectoris: Secondary | ICD-10-CM

## 2023-03-15 DIAGNOSIS — I10 Essential (primary) hypertension: Secondary | ICD-10-CM

## 2023-03-15 NOTE — Progress Notes (Signed)
Cardiology Office Note:    Date:  03/15/2023   ID:  Katherine [name], DOB 1946/01/26, MRN [id number]  PCP:  Ronnald Collum   [facility] Providers Cardiologist:  [name], MD     Referring MD: Coralee Rud, PA-C   Chief Complaint  Patient presents with   Atrial Fibrillation    History of Present Illness:    Katherine [name] is a 78 y.o. female with a hx of:  Coronary artery disease  s/p orbital atherectomy, 3 x 22 mm DES to pRCA in 03/2019 LHC 04/15/19: mLAD 40, pLCx 75, mLCx 75, pRCA 80 (PCI) Aortic stenosis S/p TAVR in 03/2019 TTE 3/21: mean 5 mmHg  TTE 05/22/19: EF 60-65, no RWMA, mild LVH, normal RVSF, mild LAE, trivial MR, s/p TAVR (mean 10 mmHg), no PVL TTE 04/21/20: EF 60-65, no RWMA, Gr 1 DD, NL RVSF, trivial MR, s/p TAVR with normal structure and function, mean 6 mmHg Hx of TIA in 12/2018 Remote AFib Hx of pulmonary embolism  Carotid artery disease [place] 08/2018: Bilateral ICA 1-39 Hypothyroidism  Dementia  Hypertension  Hyperlipidemia  Diabetes mellitus     Monitor in November 2024 demonstrated sinus rhythm with no atrial fibrillation or flutter.  Recent echocardiogram showed normal LVEF of 60 to 65%, normal RV function, and no paravalvular leak of her TAVR prosthesis.  A previous echo from November 2024 showed a mean transaortic valve gradient of 6.5 mmHg.  The patient has been recently hospitalized with Community-acquired pneumonia, and seen in the ER for altered mental status and other ER visits for mechanical falls.  She states that she is doing better now and denies any symptoms of chest pain, chest pressure, heart palpitations, or shortness of breath.  She is not able to walk at all anymore and is completely wheelchair-bound.  She is living at [facility] nursing facility.  Current Medications: Current Meds  Medication Sig   acetaminophen (TYLENOL) 500 MG tablet Take 500 mg by mouth every 6 (six) hours as needed for mild pain (pain score  1-3) or headache.   alendronate (FOSAMAX) 70 MG tablet Take 1 tablet (70 mg total) by mouth every Monday.   ascorbic acid (VITAMIN C) 500 MG tablet Take 500 mg by mouth daily.   aspirin EC 81 MG tablet Take 1 tablet (81 mg total) by mouth every other day.   benztropine (COGENTIN) 0.5 MG tablet Take 1 tablet (0.5 mg total) by mouth in the morning and at bedtime.   bisacodyl (DULCOLAX) 10 MG suppository Place 10 mg rectally daily as needed for moderate constipation.   calcium-vitamin D (OSCAL WITH D) 500-200 MG-UNIT tablet Take 1 tablet by mouth 3 (three) times daily.   fluticasone (FLONASE) 50 MCG/ACT nasal spray Place 2 sprays into both nostrils daily.   gabapentin (NEURONTIN) 100 MG capsule Take 2 capsules (200 mg total) by mouth 2 (two) times daily. Take 200 mg by mouth in the morning and 100 mg at bedtime   ipratropium (ATROVENT) 0.06 % nasal spray USE 1 SPRAY IN EACH NOSTRIL THREE TIMES DAILY   magnesium oxide (MAG-OX) 400 (240 Mg) MG tablet Take 400 mg by mouth daily.   metFORMIN (GLUCOPHAGE) 500 MG tablet Take 1 tablet (500 mg total) by mouth in the morning and at bedtime.   metoprolol tartrate (LOPRESSOR) 50 MG tablet Take 1 tablet (50 mg total) by mouth 2 (two) times daily.   Multiple Vitamin (MULTIVITAMIN WITH MINERALS) TABS tablet Take 1 tablet by mouth daily  with breakfast.   NAMZARIC 28-10 MG CP24 Take 1 capsule by mouth in the morning.   nitroGLYCERIN (NITROSTAT) 0.4 MG SL tablet DISSOLVE 1 TABLET UNDER THE TONGUE EVERY 5 MINUTES AS NEEDED FOR CHEST PAIN AS DIRECTED   NON FORMULARY Diet:Heart Healthy/CCD/thin liquids   Nutritional Supplements (NUTRITIONAL DRINK) LIQD Take 1 Container by mouth daily. [employer]   Omega-3 1000 MG CAPS Take 1,000 mg by mouth every Monday, Wednesday, and Friday.   pantoprazole (PROTONIX) 40 MG tablet Take 1 tablet (40 mg total) by mouth daily.   Potassium Chloride ER 20 MEQ TBCR Take 20 mEq by mouth in the morning and at bedtime.   risperiDONE (RISPERDAL)  2 MG tablet Take 2 mg by mouth at bedtime.   rosuvastatin (CRESTOR) 40 MG tablet Take 1 tablet orally daily at 6 PM.   [DISCONTINUED] apixaban (ELIQUIS) 5 MG TABS tablet Take 5 mg by mouth 2 (two) times daily.   [DISCONTINUED] Zinc Oxide 10 % AERO Apply 1 application  topically 2 (two) times daily. Incontinent care.     Allergies:   Penicillins, Euphorbia, Coconut (cocos nucifera), Latex, and Mellaril [thioridazine]   ROS:   Please see the history of present illness.    All other systems reviewed and are negative.  EKGs/Labs/Other Studies Reviewed:    The following studies were reviewed today: Cardiac Studies & Procedures   ______________________________________________________________________________________________ CARDIAC CATHETERIZATION  CARDIAC CATHETERIZATION 04/15/2019  Narrative Successful two-vessel orbital atherectomy and PCI using a 3.0 x 20 mm resolute Onyx DES in the RCA, 3.0 x 8 mm resolute Onyx DES in the proximal circumflex, and 2.5 x 26 mm resolute Onyx DES in the mid circumflex  Recommendation: Continue aspirin and clopidogrel without interruption for at least 6 months.  Continue with plans for TAVR for treatment of severe symptomatic aortic stenosis.  Findings Coronary Findings Diagnostic  Dominance: Right  Left Anterior Descending Mid LAD lesion is 40% stenosed. The lesion is moderately calcified.  Left Circumflex Prox Cx lesion is 75% stenosed. The lesion is severely calcified. Mid Cx to Dist Cx lesion is 75% stenosed.  Right Coronary Artery Prox RCA to Mid RCA lesion is 80% stenosed. The lesion is severely calcified.  Intervention  Prox Cx lesion Stent Lesion crossed with guidewire. Pre-stent angioplasty was performed. A drug-eluting stent was successfully placed using a STENT RESOLUTE ONYX 3.0X8. Post-stent angioplasty was performed using a BALLOON [place] EMERGE MR 3.25X6. Maximum pressure:  18 atm. Post-Intervention Lesion Assessment The intervention  was successful. Pre-interventional TIMI flow is 3. Post-intervention TIMI flow is 3. No complications occurred at this lesion. There is a 0% residual stenosis post intervention.  Mid Cx to Dist Cx lesion Stent Lesion crossed with guidewire. Pre-stent angioplasty was performed. A drug-eluting stent was successfully placed using a STENT RESOLUTE ONYX 2.5X26. Post-stent angioplasty was performed using a BALLOON SAPPHIRE [place] [id number]. Maximum pressure:  16 atm. Post-Intervention Lesion Assessment The intervention was successful. Pre-interventional TIMI flow is 3. Post-intervention TIMI flow is 3. No complications occurred at this lesion. There is a 0% residual stenosis post intervention.  Prox RCA to Mid RCA lesion Stent Lesion crossed with guidewire. Pre-stent angioplasty was performed. A drug-eluting stent was successfully placed using a STENT RESOLUTE ONYX 3.0X22. Post-stent angioplasty was performed using a BALLOON SAPPHIRE [place] 3.0X15. Maximum pressure:  18 atm. Post-Intervention Lesion Assessment The intervention was successful. Pre-interventional TIMI flow is 3. Post-intervention TIMI flow is 3. No complications occurred at this lesion. There is a 0% residual stenosis post intervention.  CARDIAC CATHETERIZATION  CARDIAC CATHETERIZATION 09/11/2018  Narrative  Prox RCA to Mid RCA lesion is 80% stenosed.  Prox Cx lesion is 75% stenosed.  Mid Cx to Dist Cx lesion is 50% stenosed.  Mid LAD lesion is 40% stenosed.  There is moderate aortic valve stenosis.  1. Severe 2 vessel CAD with severe stenosis of the proximal circumflex and mid-RCA 2. Moderate AS by direct pressure measurement, mean transvalvular gradient 25 mmHg and calculated AVA 1.22 square cm  Plan: Check 2D echo, review case with multidisciplinary heart valve team. If severe AS by echo, likely will need to proceed with 2 vessel PCI with atherectomy prior to TAVR.  Findings Coronary Findings Diagnostic  Dominance:  Right  Left Anterior Descending Mid LAD lesion is 40% stenosed. The lesion is moderately calcified.  Left Circumflex Prox Cx lesion is 75% stenosed. The lesion is severely calcified. Mid Cx to Dist Cx lesion is 50% stenosed.  Right Coronary Artery Prox RCA to Mid RCA lesion is 80% stenosed. The lesion is severely calcified.  Intervention  No interventions have been documented.     ECHOCARDIOGRAM  ECHOCARDIOGRAM LIMITED 02/05/2023  Narrative ECHOCARDIOGRAM LIMITED REPORT    Patient Name:   Jamy Whyte [facility] Date of Exam: 02/05/2023 Medical Rec #:  [id number]         Height:       59.0 in Accession #:    [id number]        Weight:       121.3 lb Date of Birth:  1945/08/13          BSA:          1.491 m Patient Age:    77 years          BP:           135/95 mmHg Patient Gender: F                 HR:           82 bpm. Exam Location:  Inpatient  Procedure: 2D Echo, Cardiac Doppler and Color Doppler  Indications:    chest pain  History:        Patient has no prior history of Echocardiogram examinations, most recent 01/18/2023. Risk Factors:Hypertension. Aortic Valve: 26 mm Ultra, stented (TAVR) valve is present in the aortic position. Procedure Date: 03/2019.  Sonographer:    Melissa Morford RDCS (AE, PE) Referring Phys: SUBRINA SUNDIL  IMPRESSIONS   1. Left ventricular ejection fraction, by estimation, is 60 to 65%. The left ventricle has normal function. The left ventricle has no regional wall motion abnormalities. 2. Right ventricular systolic function is hyperdynamic. The right ventricular size is mildly enlarged. 3. Aortic valve gradients were not interrogated. No perivalvular leak.. The aortic valve has been repaired/replaced. There is a 26 mm Ultra, stented (TAVR) valve present in the aortic position. Procedure Date: 03/2019.  Comparison(s): No significant change from prior study. 01/18/2023: LVEF 60-65%.  FINDINGS Left Ventricle: Left ventricular ejection fraction,  by estimation, is 60 to 65%. The left ventricle has normal function. The left ventricle has no regional wall motion abnormalities. The left ventricular internal cavity size was normal in size. There is no left ventricular hypertrophy.  Right Ventricle: The right ventricular size is mildly enlarged. No increase in right ventricular wall thickness. Right ventricular systolic function is hyperdynamic.  Aortic Valve: Aortic valve gradients were not interrogated. No perivalvular leak. The aortic valve has been repaired/replaced. There is a 26 mm Ultra, stented (  TAVR) valve present in the aortic position. Procedure Date: 03/2019.  LEFT VENTRICLE PLAX 2D LVIDd:         4.10 cm LVIDs:         3.10 cm LV PW:         0.90 cm LV IVS:        0.90 cm   LEFT ATRIUM         Index LA diam:    4.30 cm 2.88 cm/m  AORTA Ao Root diam: 2.70 cm  Zoila Shutter MD Electronically signed by Zoila Shutter MD Signature Date/Time: 02/05/2023/2:49:50 PM    Final    MONITORS  CARDIAC EVENT MONITOR 12/13/2022  Narrative Summary: The basic rhythm is normal sinus with an average HR of 79 bpm (52 to 141 bpm) No atrial fibrillation or flutter No high-grade heart block or pathologic pauses There are occasion PVC's with an overall burden of approximately 2% and frequent supraventricular beats with an overall burden of 27% No sustained arrhythmias   CT SCANS  CT CORONARY MORPH W/CTA COR W/SCORE 04/03/2019  Addendum 04/03/2019 10:23 PM ADDENDUM REPORT: 04/03/2019 22:21  CLINICAL DATA:  24 -year-old female with severe aortic stenosis being evaluated for a TAVR procedure.  EXAM: Cardiac TAVR CT  TECHNIQUE: The patient was scanned on a [employer]. A 120 kV retrospective scan was triggered in the descending thoracic aorta at 111 HU's. Gantry rotation speed was 250 msecs and collimation was .6 mm. No beta blockade or nitro were given. The 3D data set was reconstructed in 5% intervals of the R-R  cycle. Systolic and diastolic phases were analyzed on a dedicated work station using MPR, MIP and VRT modes. The patient received 80 cc of contrast.  FINDINGS: Aortic Root:  Aortic valve: Trileaflet  Aortic valve calcium score: 1546  Aortic annulus:  Diameter: 29mm x 21mm  Perimeter: 79mm  Area: 456 mm^2  Calcifications: Moderate annular calcifications adjacent to left coronary cusp  Coronary height: Min Left - 12mm, Max Left - 16mm; Min Right - 13mm  Sinotubular height: Left cusp - 20mm; Right cusp - 19mm; Noncoronary cusp - 19mm  LVOT (as measured 3 mm below the annulus):  Diameter: 30mm x 20mm  Area: 471 mm^2  Calcifications: Moderate calcifications, located beneath left coronary cusp  Aortic sinus width: Left cusp -32mm ; Right cusp - 28mm; Noncoronary cusp - 31mm  Sinotubular junction width: 28mm x 27mm  Optimum Fluoroscopic Angle for Delivery: LAO 27 CAU 11  Cardiac:  Right atrium: Mild enlargement  Right ventricle: Mild dilatation  Pulmonary arteries: Normal size  Pulmonary veins: Normal configuration  Left atrium: Mild enlargement  Left ventricle: Normal size  Pericardium: Normal thickness  Coronary arteries: Calcium score 2164 (99th percentile)  IMPRESSION: 1. Trileaflet aortic valve with severe calcifications (calcium score 1546)  2. Aortic annulus measures 29mm x 21mm in diameter with perimeter 79mm and area 456 mm^2. Annular measurements suitable for delivery of a 26mm Edwards-Sapien 3 valve  3. Moderate annular calcifications adjacent to the left coronary cusp, extending into the LVOT  4. Coronary to annulus distance is borderline low from the left main (12mm). Sufficient coronary to annulus distance from the RCA (13mm)  5. Optimum Fluoroscopic Angle for Delivery (centered on RCC): LAO 27 CAU 11  6. Coronary arteries are severely calcified (calcium score 2164, 99th percentile)   Electronically Signed By: Epifanio Lesches MD On: 04/03/2019 22:21  Narrative EXAM: OVER-READ INTERPRETATION  CT CHEST  The following report is  an over-read performed by radiologist Dr. Trudie Reed of [facility] on 04/03/2019. This over-read does not include interpretation of cardiac or coronary anatomy or pathology. The coronary calcium score/coronary CTA interpretation by the cardiologist is attached.  COMPARISON:  None.  FINDINGS: Extracardiac findings will be described separately under dictation for contemporaneously obtained CTA chest, abdomen and pelvis.  IMPRESSION: 1. Please see separate dictation for contemporaneously obtained CTA chest, abdomen and pelvis 04/03/2019 for full description of relevant extracardiac findings.  Electronically Signed: By: Trudie Reed M.D. On: 04/03/2019 13:58     ______________________________________________________________________________________________      EKG:        Recent Labs: 02/18/2023: TSH 0.213 02/19/2023: ALT 23; B Natriuretic Peptide 362.3 02/21/2023: Magnesium 1.7 02/23/2023: BUN 22; Creatinine, Ser 0.60; Hemoglobin 9.7; Platelets 173; Potassium 4.0; Sodium 143  Recent Lipid Panel    Component Value Date/Time   CHOL 122 10/27/2019 1402   TRIG 113 10/27/2019 1402   TRIG 104 01/24/2006 0956   HDL 50 10/27/2019 1402   CHOLHDL 2.4 10/27/2019 1402   CHOLHDL 4 06/15/2015 1349   VLDL 29.6 06/15/2015 1349   LDLCALC 52 10/27/2019 1402   LDLDIRECT 66.0 06/16/2014 1437                     Physical Exam:    VS:  BP 120/70   Pulse 65 Comment: unable to obtain  Ht 4\' 11"  (1.499 m)   BMI 25.74 kg/m     Wt Readings from Last 3 Encounters:  02/14/23 127 lb 6.8 oz (57.8 kg)  02/04/23 121 lb 4.1 oz (55 kg)  06/15/21 123 lb 6.4 oz (56 kg)     GEN: Elderly woman, resting tremor, in wheelchair, in no acute distress HEENT: Normal NECK: No JVD; No carotid bruits LYMPHATICS: No lymphadenopathy CARDIAC: RRR, no murmurs, rubs,  gallops RESPIRATORY:  Clear to auscultation without rales, wheezing or rhonchi  ABDOMEN: Soft, non-tender, non-distended MUSCULOSKELETAL:  No edema; No deformity  SKIN: Warm and dry NEUROLOGIC:  Alert and oriented x 3 PSYCHIATRIC:  Normal affect   Assessment & Plan Paroxysmal atrial fibrillation (HCC) The patient has become increasingly frail, at risk of recurrent falls having fallen a few times in the last several months.  She is wheelchair-bound with what appears to be fairly advanced Parkinson's disease.  We performed a 30-day heart monitor that showed no atrial fibrillation or flutter.  She reportedly had atrial fibrillation remotely.  I have reviewed her EKGs over the past year which have demonstrated sinus rhythm or sinus tachycardia with PACs.  I think the risk/benefit of anticoagulation is unfavorable.  I have reviewed Natale Lay note from last fall who also recommended stopping her anticoagulation if we did not demonstrate atrial fibrillation on her monitor.  We will stop apixaban and start her on aspirin 81 mg daily.  The patient is not a candidate for invasive procedures due to her frailty and poor health status.  I would not consider her a candidate for watchman implantation. Coronary artery disease involving native coronary artery of native heart without angina pectoris No anginal symptoms.  Start aspirin 81 mg daily.  Risk reduction measures with rosuvastatin. Essential hypertension Blood pressure well-controlled.  Continue metoprolol. S/P TAVR (transcatheter aortic valve replacement) She has had normal function of her transcatheter aortic valve on recent echo imaging.  This is reviewed today as outlined above.       Medication Adjustments/Labs and Tests Ordered: Current medicines are reviewed at length with  the patient today.  Concerns regarding medicines are outlined above.  No orders of the defined types were placed in this encounter.  No orders of the defined types were  placed in this encounter.   Patient Instructions  Medication Instructions:  STOP Eliquis  Continue Aspirin 81 mg Take one tablet by mouth daily.   *If you need a refill on your cardiac medications before your next appointment, please call your pharmacy*  Follow-Up: At [facility], you and your health needs are our priority.  As part of our continuing mission to provide you with exceptional heart care, we have created designated Provider Care Teams.  These Care Teams include your primary Cardiologist (physician) and Advanced Practice Providers (APPs -  Physician Assistants and Nurse Practitioners) who all work together to provide you with the care you need, when you need it.  We recommend signing up for the patient portal called "MyChart".  Sign up information is provided on this After Visit Summary.  MyChart is used to connect with patients for Virtual Visits (Telemedicine).  Patients are able to view lab/test results, encounter notes, upcoming appointments, etc.  Non-urgent messages can be sent to your provider as well.   To learn more about what you can do with MyChart, go to [email or web address].    Your next appointment:   6 month(s)  Provider:   Tereso Newcomer, PA-C      Other Instructions   1st Floor: - Lobby - Registration  - Pharmacy  - Lab - Cafe  2nd Floor: - PV Lab - Diagnostic Testing (echo, CT, nuclear med)  3rd Floor: - Vacant  4th Floor: - TCTS (cardiothoracic surgery) - AFib Clinic - Structural Heart Clinic - Vascular Surgery  - Vascular Ultrasound  5th Floor: - HeartCare Cardiology (general and EP) - Clinical Pharmacy for coumadin, hypertension, lipid, weight-loss medications, and med management appointments    Valet parking services will be available as well.        Signed, [name], MD  03/15/2023 5:08 PM    [facility]

## 2023-03-15 NOTE — Assessment & Plan Note (Signed)
No anginal symptoms.  Start aspirin 81 mg daily.  Risk reduction measures with rosuvastatin.

## 2023-03-15 NOTE — Assessment & Plan Note (Signed)
Blood pressure well-controlled.  Continue metoprolol.

## 2023-03-15 NOTE — Patient Instructions (Addendum)
Medication Instructions:  STOP Eliquis  Continue Aspirin 81 mg Take one tablet by mouth daily.   *If you need a refill on your cardiac medications before your next appointment, please call your pharmacy*  Follow-Up: At [facility], you and your health needs are our priority.  As part of our continuing mission to provide you with exceptional heart care, we have created designated Provider Care Teams.  These Care Teams include your primary Cardiologist (physician) and Advanced Practice Providers (APPs -  Physician Assistants and Nurse Practitioners) who all work together to provide you with the care you need, when you need it.  We recommend signing up for the patient portal called "MyChart".  Sign up information is provided on this After Visit Summary.  MyChart is used to connect with patients for Virtual Visits (Telemedicine).  Patients are able to view lab/test results, encounter notes, upcoming appointments, etc.  Non-urgent messages can be sent to your provider as well.   To learn more about what you can do with MyChart, go to [email or web address].    Your next appointment:   6 month(s)  Provider:   Tereso Newcomer, PA-C      Other Instructions   1st Floor: - Lobby - Registration  - Pharmacy  - Lab - Cafe  2nd Floor: - PV Lab - Diagnostic Testing (echo, CT, nuclear med)  3rd Floor: - Vacant  4th Floor: - TCTS (cardiothoracic surgery) - AFib Clinic - Structural Heart Clinic - Vascular Surgery  - Vascular Ultrasound  5th Floor: - HeartCare Cardiology (general and EP) - Clinical Pharmacy for coumadin, hypertension, lipid, weight-loss medications, and med management appointments    Valet parking services will be available as well.

## 2023-03-15 NOTE — Assessment & Plan Note (Signed)
She has had normal function of her transcatheter aortic valve on recent echo imaging.  This is reviewed today as outlined above.

## 2023-11-08 IMAGING — CT CT PELVIS W/O CM
2 of 6 series · 15 of 46 positions shown, 17 images · non-contrast
Comparison: Bilateral hip radiographs-earlier same day CT the
chest, abdomen pelvis-04/03/2019

CLINICAL DATA: Post fall, now with concern for hip fracture.

EXAM:
CT PELVIS WITHOUT CONTRAST
TECHNIQUE: Multidetector CT imaging of the pelvis was performed following the
standard protocol without intravenous contrast.
RADIATION DOSE REDUCTION: This exam was performed according to the
departmental dose-optimization program which includes automated
exposure control, adjustment of the mA and/or kV according to
patient size and/or use of iterative reconstruction technique.

[Series 3: axial st · axial · 0.71mm/px · z∈[-413,-189]mm · 12 of 130 slices shown, 14 images]
[im 9/130  soft-tissue]
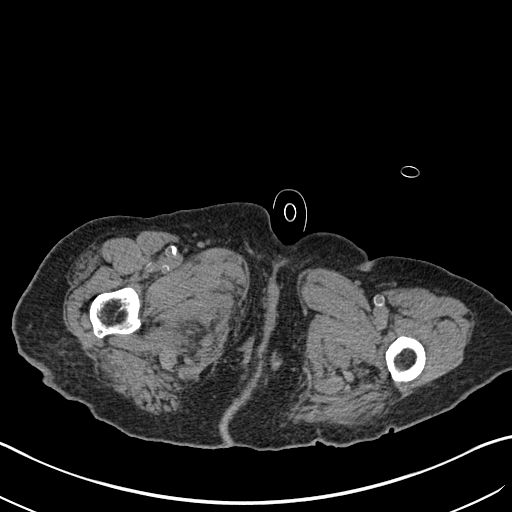
[im 9/130  bone]
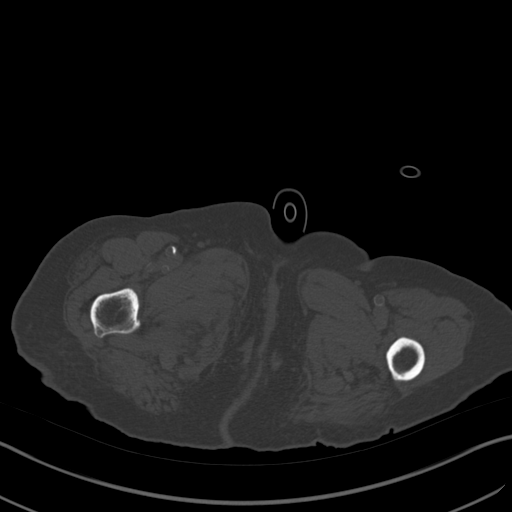
[im 17/130  soft-tissue]
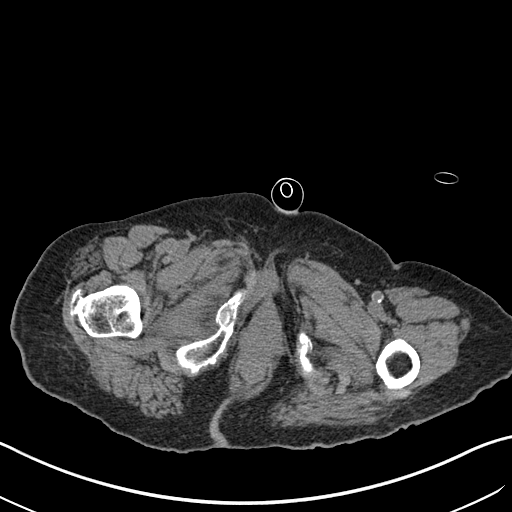
[im 30/130  soft-tissue]
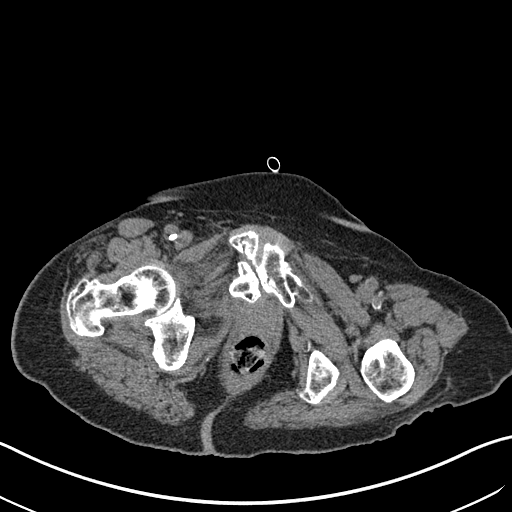
[im 38/130  soft-tissue]
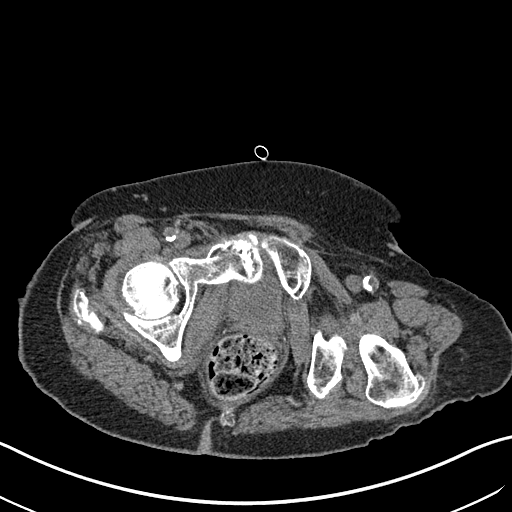
[im 50/130  soft-tissue]
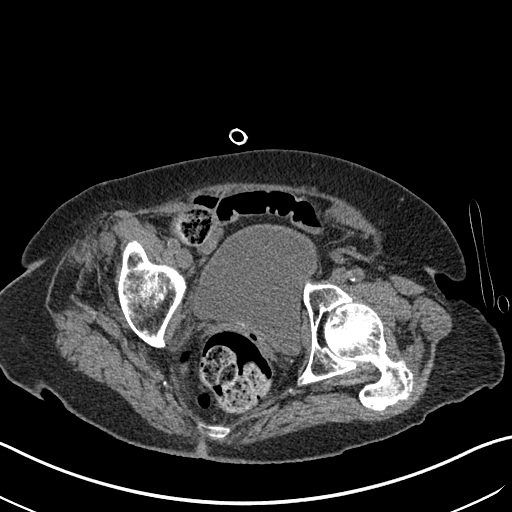
[im 59/130  soft-tissue]
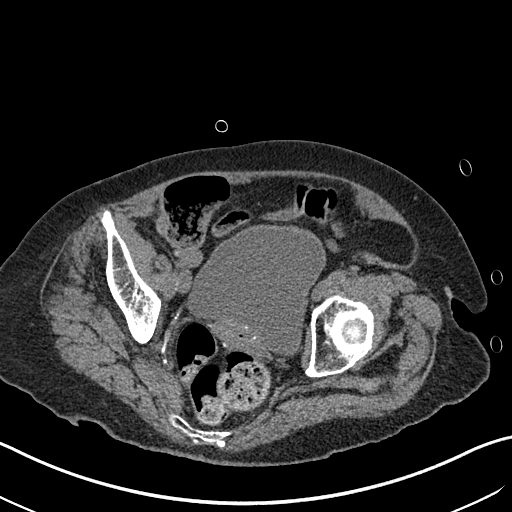
[im 71/130  soft-tissue]
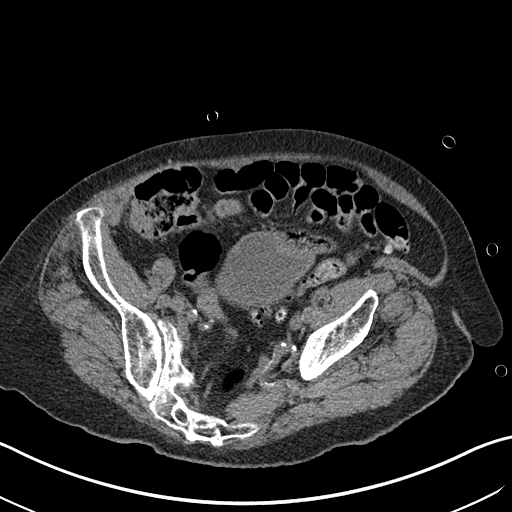
[im 80/130  soft-tissue]
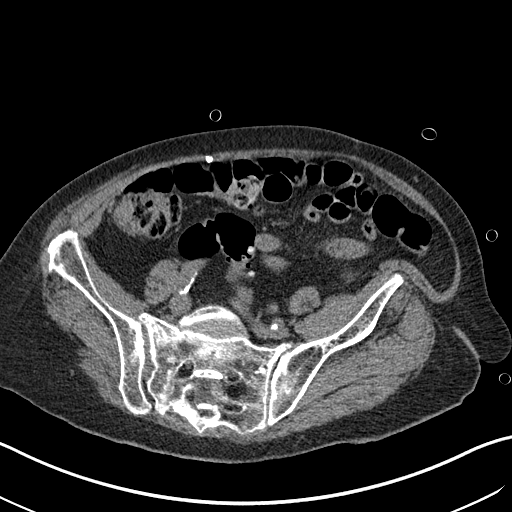
[im 92/130  soft-tissue]
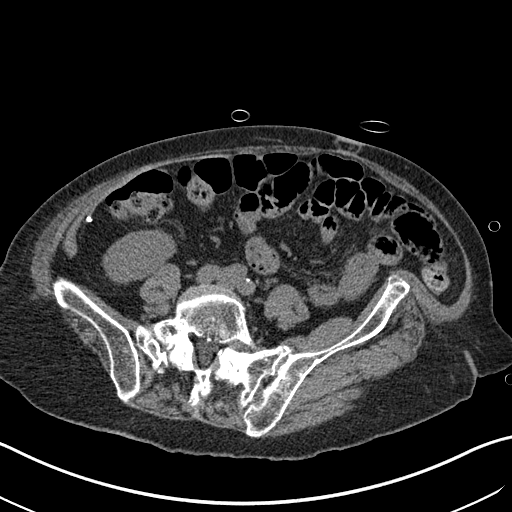
[im 92/130  bone]
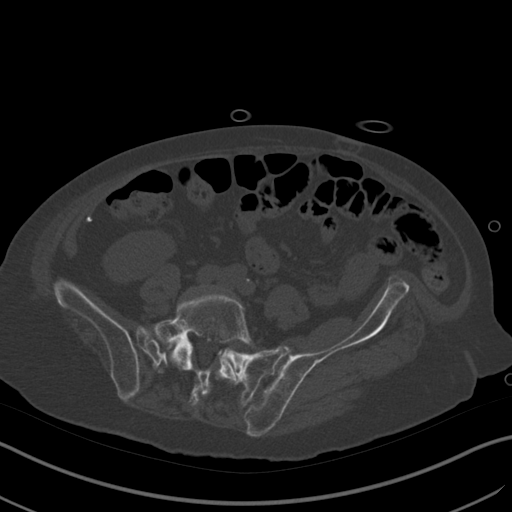
[im 100/130  soft-tissue]
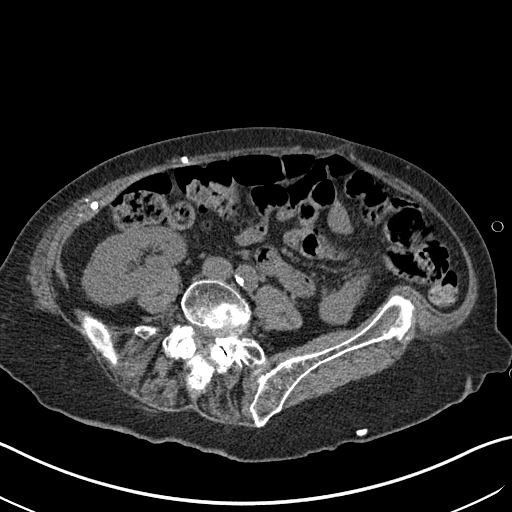
[im 113/130  soft-tissue]
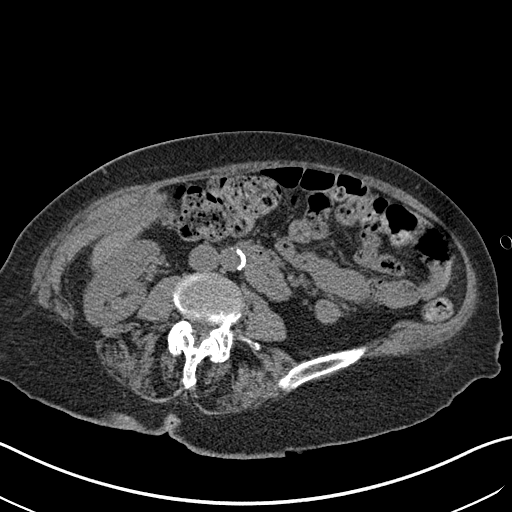
[im 121/130  soft-tissue]
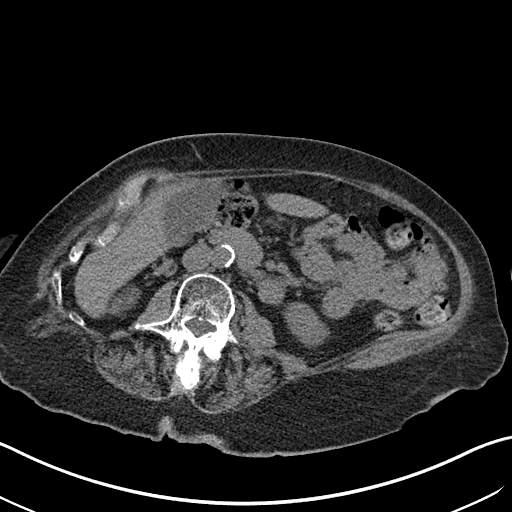

[Series 8: coronal st · coronal · 0.53mm/px · 3 of 134 slices shown]
[im 34/134  soft-tissue]
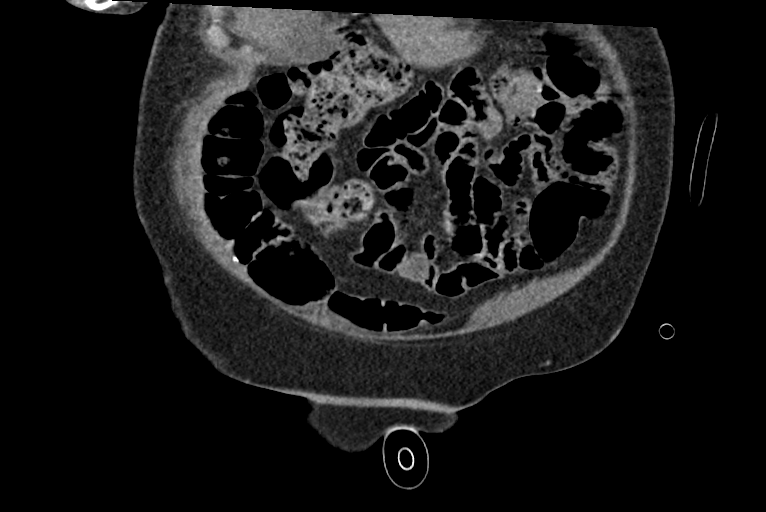
[im 67/134  soft-tissue]
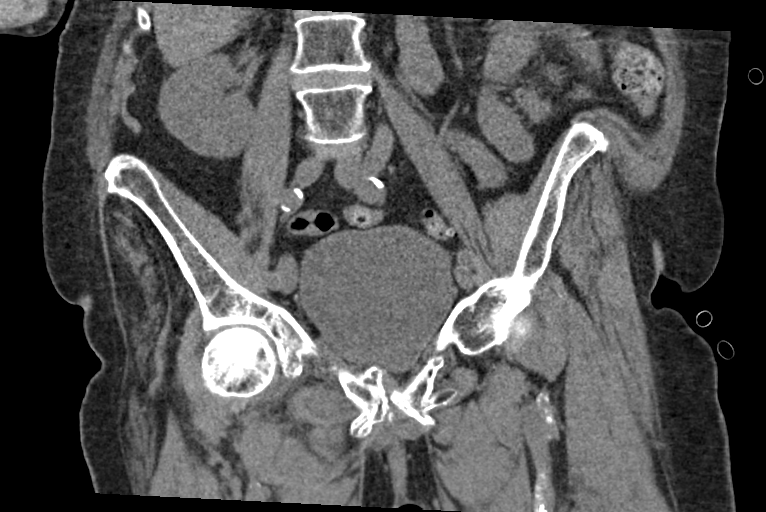
[im 100/134  soft-tissue]
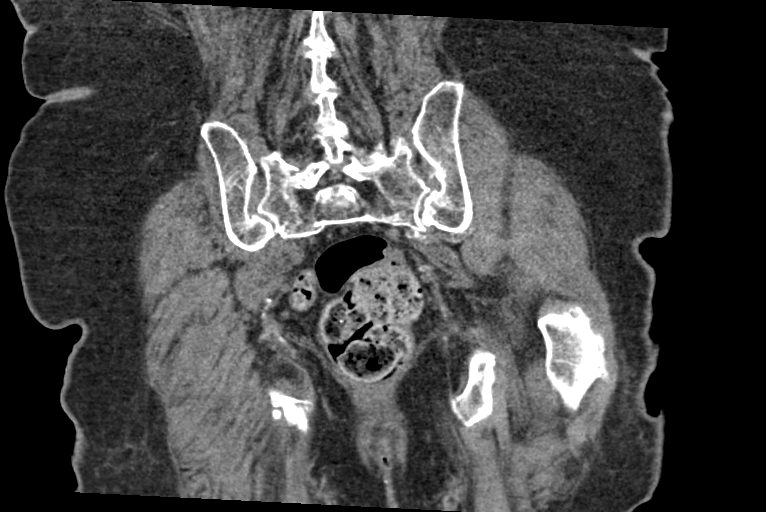

[15 of 46 positions shown; findings below may reference images not displayed]

FINDINGS: Musculoskeletal: There is an acute nondisplaced fracture involving
the right superior pubic ramus (coronal images 45 through 47, series
extension to involve the weight-bearing surface of the acetabulum.

Acute nondisplaced fracture involving the right sacral ala extending
to involve the right SI joint (images 50 through 57, series 2).

Chronic fractures and deformity involving the bilateral superior and
inferior pubic rami with extension to the pubic symphysis and a
displaced ossicle about the anterior aspect the pubic symphysis
which measures approximately 2.3 x 1.2 cm.

Mild degenerative change of the bilateral hips with joint space
loss, subchondral sclerosis and osteophytosis. Moderate bilateral
facet degenerative change the lower lumbar spine.

Other: Regional soft tissues appear normal. No radiopaque foreign
body.

Urinary Tract: Normal appearance of the urinary bladder given degree
of distention. Normal noncontrast appearance of the right kidney.
The left kidney was not significantly imaged.

Bowel: Moderate to large colonic stool burden without evidence of
enteric obstruction. Colonic diverticulosis without evidence
superimposed acute diverticulitis. Normal noncontrast appearance of
the terminal ileum and the retrocecal appendix.

Vascular/Lymphatic: Atherosclerotic plaque within a normal caliber
abdominal aorta.

No bulky retroperitoneal, mesenteric, pelvic or inguinal
lymphadenopathy.

Reproductive:  Post hysterectomy.  No discrete adnexal lesions.
IMPRESSION: 1. Acute nondisplaced fracture involving the right superior pubic
ramus without extension to involve the weight-bearing surface of the
acetabulum.
2. Acute nondisplaced fracture involving the right sacral ala with
extension to involve the right SI joint.
3. Chronic fractures and residual deformity involving the bilateral
superior and inferior pubic rami with extension to involve the pubic
symphysis.
4.  Aortic Atherosclerosis ([id number]).

## 2023-11-08 IMAGING — CT CT HEAD W/O CM
3 series · 15 of 47 positions shown, 18 images · non-contrast
Comparison: CT 11/17/2016

CLINICAL DATA: Head trauma, minor (Age >= 65y); Neck trauma (Age >=
65y)



[Series 1: head wo · axial · 0.39mm/px · z∈[+1452,+1577]mm · 9 of 31 slices shown, 12 images]
[im 3/31  brain]
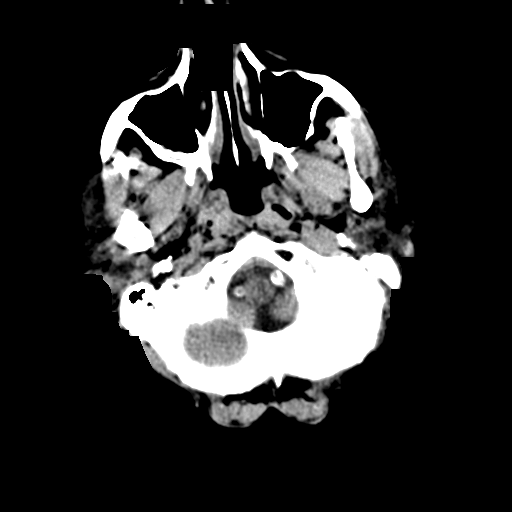
[im 3/31  bone]
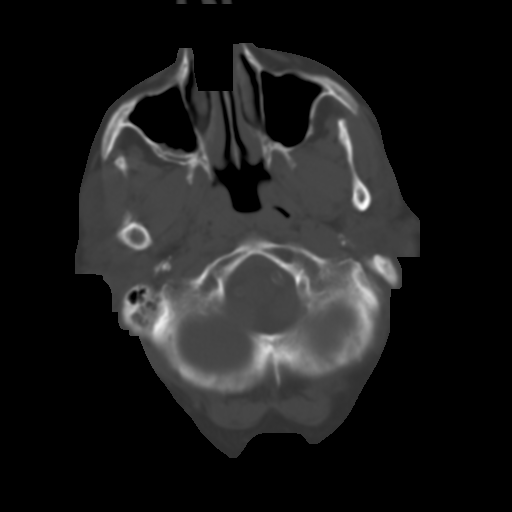
[im 6/31  brain]
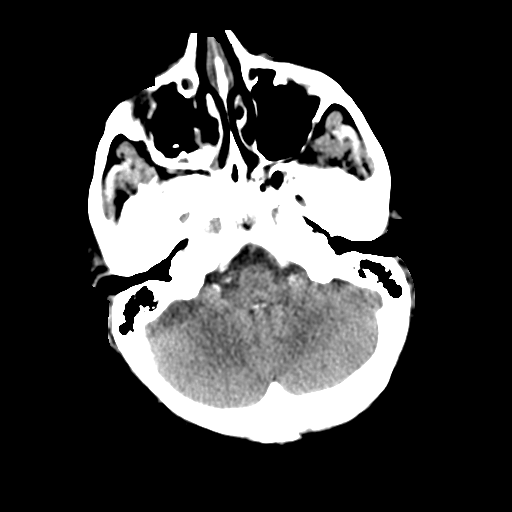
[im 9/31  brain]
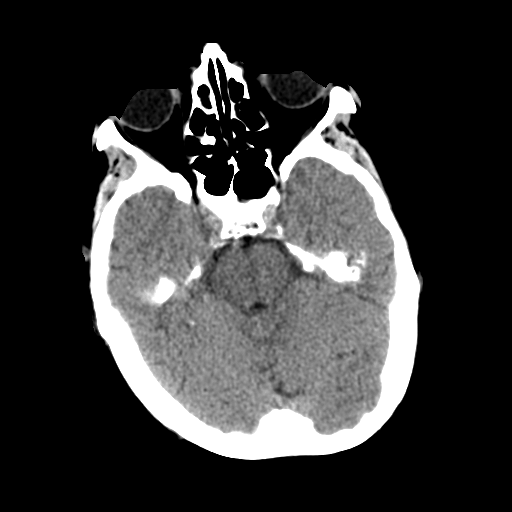
[im 12/31  brain]
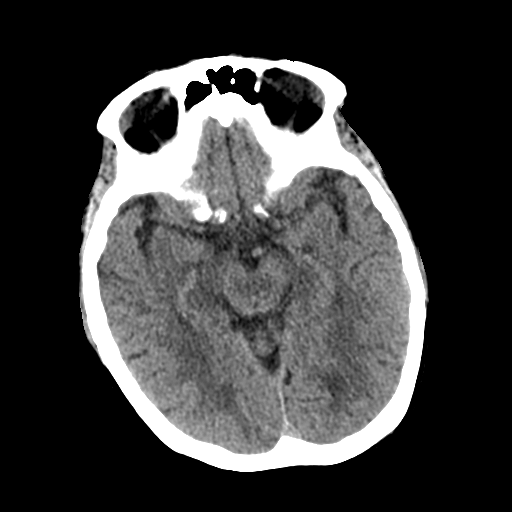
[im 16/31  brain]
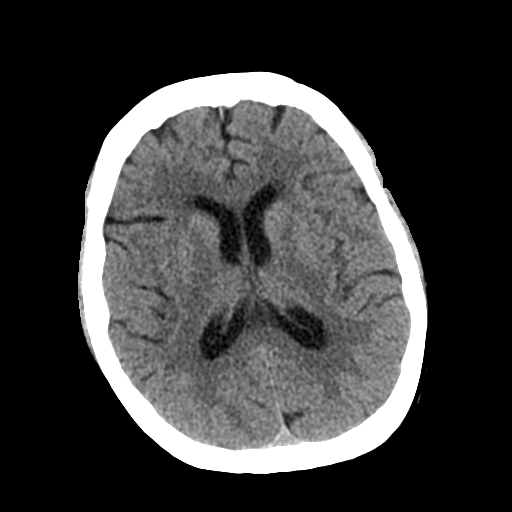
[im 16/31  bone]
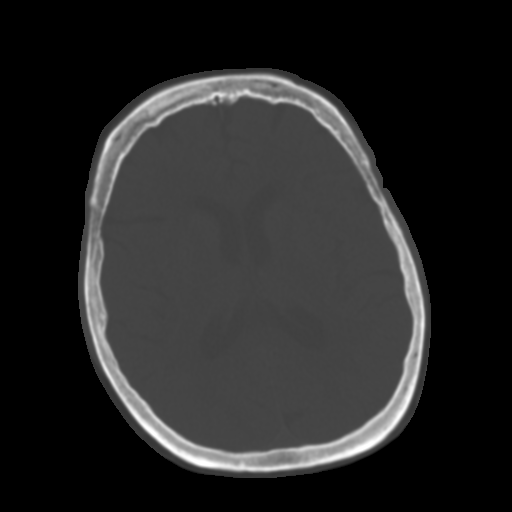
[im 19/31  brain]
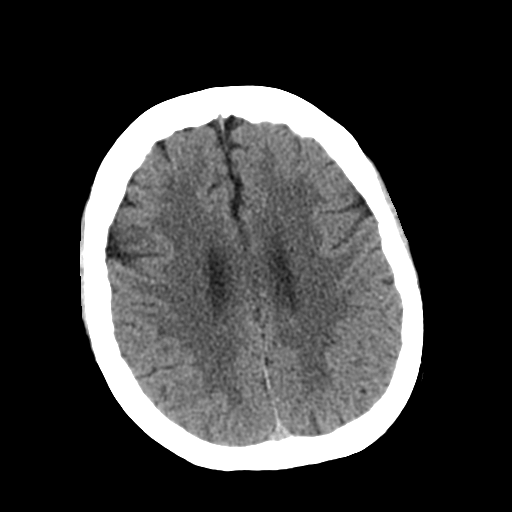
[im 22/31  brain]
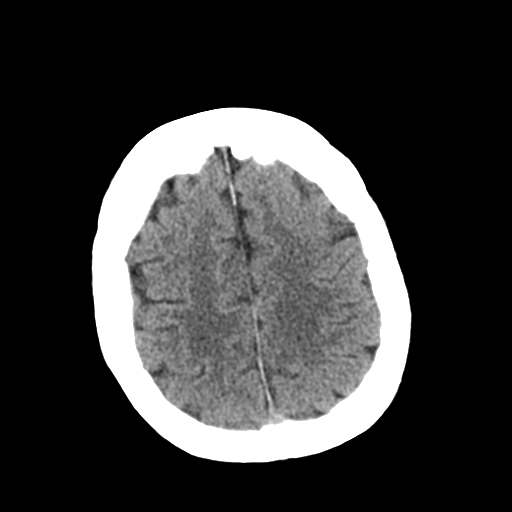
[im 25/31  brain]
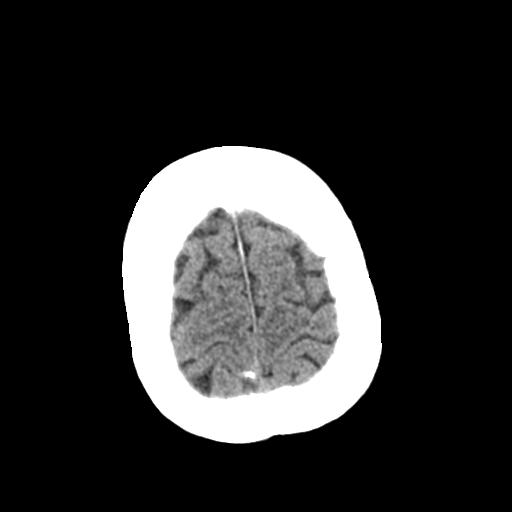
[im 28/31  brain]
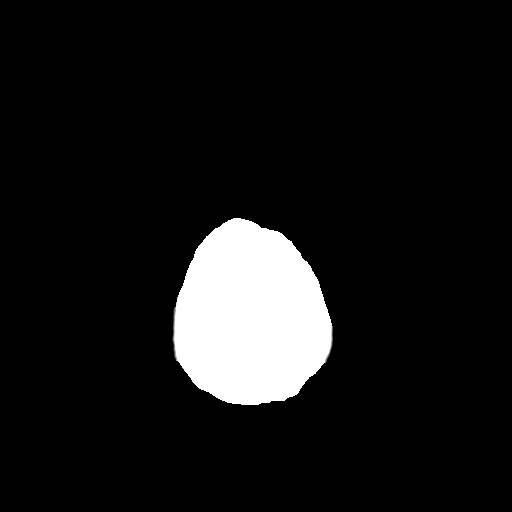
[im 28/31  bone]
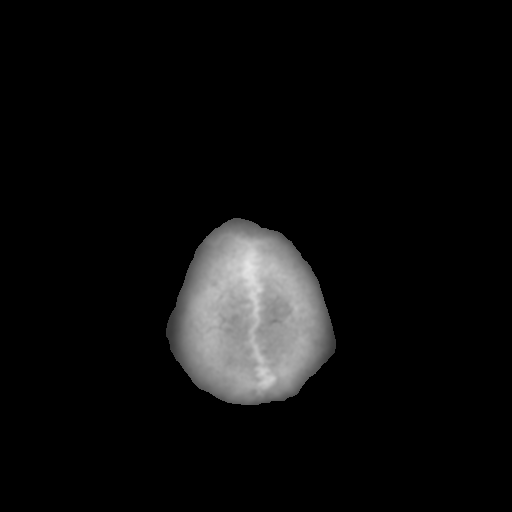

[Series 5: coronal soft tissue · coronal · 0.31mm/px · 3 of 61 slices shown]
[im 21/61  brain]
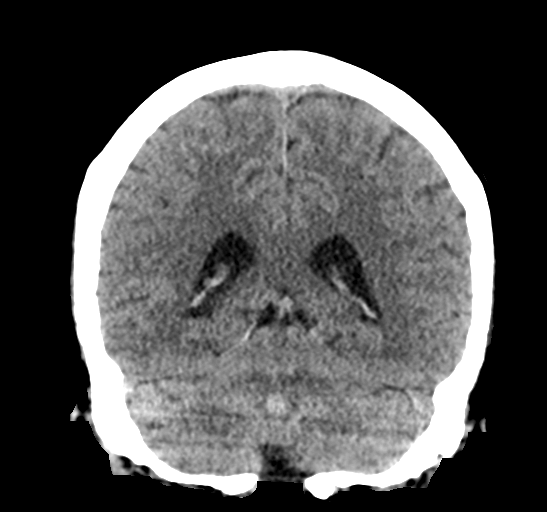
[im 27/61  brain]
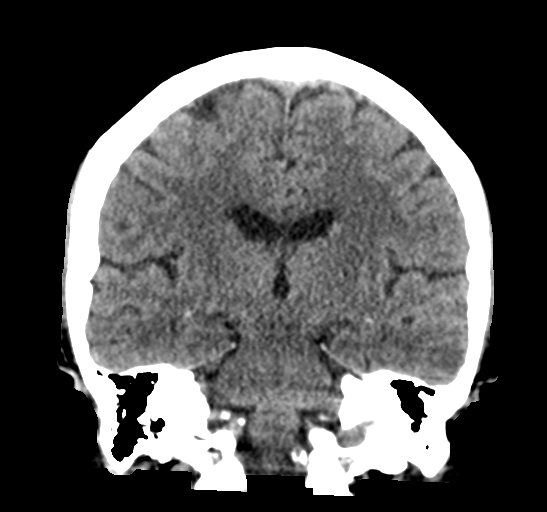
[im 34/61  brain]
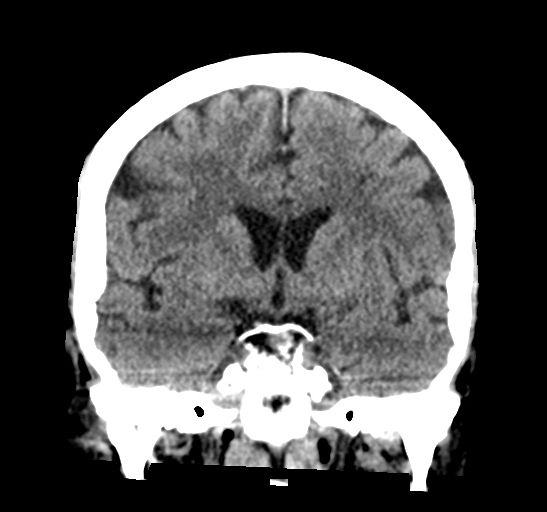

[Series 7: sagittal soft tissue · sagittal · 0.31mm/px · 3 of 53 slices shown]
[im 18/53  brain]
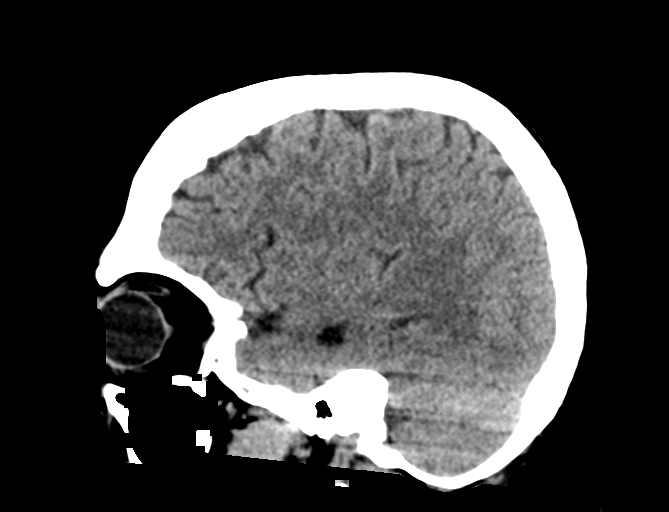
[im 27/53  brain]
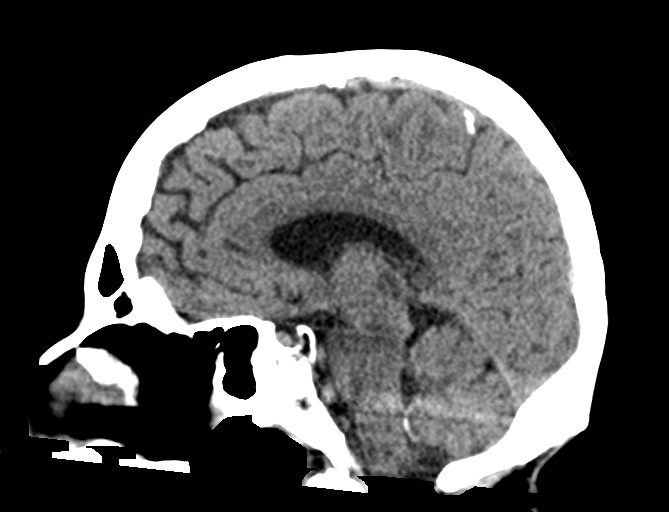
[im 35/53  brain]
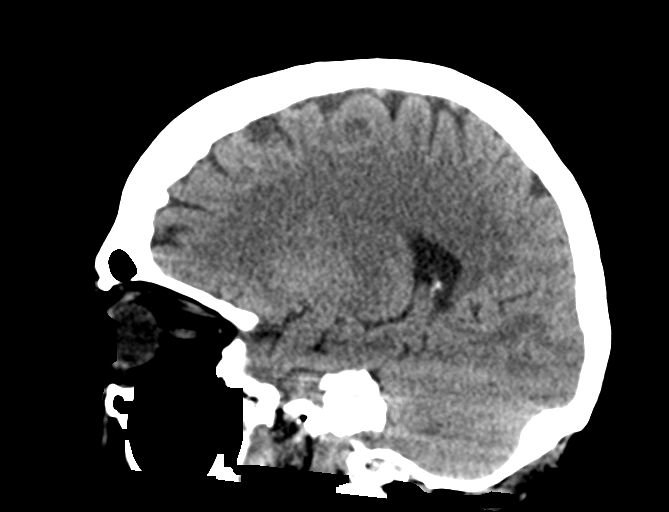

[15 of 47 positions shown; findings below may reference images not displayed]

FINDINGS: CT HEAD FINDINGS

Brain: No evidence of acute intracranial hemorrhage or extra-axial
collection.No evidence of mass lesion/concerning mass effect.The
ventricles are normal in size.Unchanged left basal ganglia lacunar
infarcts or prominent perivascular spaces.

Vascular: Vascular calcifications.  No hyperdense vessel.

Skull: Negative for skull fracture. Mild hyperostosis frontalis
internus.

Sinuses/Orbits: Mild paranasal sinus mucosal thickening. The orbits
are unremarkable.

Other: None.

CT CERVICAL SPINE FINDINGS

Alignment: Normal.

Skull base and vertebrae: There is no acute cervical spine fracture.
No aggressive osseous lesion.

Soft tissues and spinal canal: No prevertebral fluid or swelling. No
visible canal hematoma.

Disc levels: Multilevel degenerative disc disease, worst at C3-C4
and C5-C6. There is mild to moderate multilevel facet arthropathy.
C1-C2 degenerative changes.

Upper chest: Negative.

Other: None.
IMPRESSION: No acute intracranial abnormality.

No acute cervical spine fracture.
